# Patient Record
Sex: Female | Born: 1940 | Race: Black or African American | Hispanic: No | Marital: Married | Smoking: Former smoker
Health system: Southern US, Community
[De-identification: ages and names within clinical notes are randomized; demographics above are authoritative.]

## PROBLEM LIST (undated history)

## (undated) DIAGNOSIS — L439 Lichen planus, unspecified: Secondary | ICD-10-CM

## (undated) DIAGNOSIS — E785 Hyperlipidemia, unspecified: Secondary | ICD-10-CM

## (undated) DIAGNOSIS — I2510 Atherosclerotic heart disease of native coronary artery without angina pectoris: Secondary | ICD-10-CM

## (undated) DIAGNOSIS — C50919 Malignant neoplasm of unspecified site of unspecified female breast: Secondary | ICD-10-CM

## (undated) DIAGNOSIS — C801 Malignant (primary) neoplasm, unspecified: Secondary | ICD-10-CM

## (undated) DIAGNOSIS — Z923 Personal history of irradiation: Secondary | ICD-10-CM

## (undated) DIAGNOSIS — I219 Acute myocardial infarction, unspecified: Secondary | ICD-10-CM

## (undated) DIAGNOSIS — K219 Gastro-esophageal reflux disease without esophagitis: Secondary | ICD-10-CM

## (undated) DIAGNOSIS — I10 Essential (primary) hypertension: Secondary | ICD-10-CM

## (undated) HISTORY — DX: Gastro-esophageal reflux disease without esophagitis: K21.9

## (undated) HISTORY — PX: OOPHORECTOMY: SHX86

## (undated) HISTORY — PX: CORONARY ANGIOPLASTY WITH STENT PLACEMENT: SHX49

## (undated) HISTORY — PX: APPENDECTOMY: SHX54

## (undated) HISTORY — PX: ABDOMINAL HYSTERECTOMY: SHX81

## (undated) HISTORY — PX: BREAST EXCISIONAL BIOPSY: SUR124

## (undated) HISTORY — DX: Malignant (primary) neoplasm, unspecified: C80.1

## (undated) HISTORY — PX: TONSILLECTOMY: SUR1361

## (undated) HISTORY — DX: Lichen planus, unspecified: L43.9

## (undated) HISTORY — PX: CYSTOSCOPY: SUR368

---

## 2004-06-16 ENCOUNTER — Ambulatory Visit: Payer: Self-pay | Admitting: Family Medicine

## 2004-10-29 ENCOUNTER — Ambulatory Visit: Payer: Self-pay | Admitting: Gastroenterology

## 2005-07-08 ENCOUNTER — Ambulatory Visit: Payer: Self-pay | Admitting: Family Medicine

## 2006-07-14 ENCOUNTER — Ambulatory Visit: Payer: Self-pay | Admitting: Family Medicine

## 2006-11-04 ENCOUNTER — Ambulatory Visit: Payer: Self-pay | Admitting: Family Medicine

## 2006-12-01 ENCOUNTER — Ambulatory Visit: Payer: Self-pay | Admitting: Gastroenterology

## 2008-02-08 ENCOUNTER — Ambulatory Visit: Payer: Self-pay | Admitting: Family Medicine

## 2008-08-20 ENCOUNTER — Ambulatory Visit: Payer: Self-pay | Admitting: Family Medicine

## 2008-08-26 ENCOUNTER — Ambulatory Visit: Payer: Self-pay | Admitting: Family Medicine

## 2008-10-08 ENCOUNTER — Ambulatory Visit: Payer: Self-pay | Admitting: Gastroenterology

## 2009-03-20 ENCOUNTER — Ambulatory Visit: Payer: Self-pay | Admitting: Family Medicine

## 2009-04-19 ENCOUNTER — Emergency Department: Payer: Self-pay | Admitting: Emergency Medicine

## 2009-07-08 ENCOUNTER — Ambulatory Visit: Payer: Self-pay | Admitting: Otolaryngology

## 2009-07-09 ENCOUNTER — Inpatient Hospital Stay: Payer: Self-pay | Admitting: Otolaryngology

## 2009-09-19 ENCOUNTER — Ambulatory Visit: Payer: Self-pay | Admitting: Family Medicine

## 2010-05-19 ENCOUNTER — Ambulatory Visit: Payer: Self-pay | Admitting: Family Medicine

## 2010-06-01 ENCOUNTER — Ambulatory Visit: Payer: Self-pay | Admitting: Gastroenterology

## 2010-06-04 ENCOUNTER — Ambulatory Visit: Payer: Self-pay | Admitting: Gastroenterology

## 2011-01-26 ENCOUNTER — Ambulatory Visit: Payer: Self-pay | Admitting: Otolaryngology

## 2011-08-18 ENCOUNTER — Ambulatory Visit: Payer: Self-pay | Admitting: Family Medicine

## 2012-03-16 ENCOUNTER — Ambulatory Visit: Payer: Self-pay | Admitting: Specialist

## 2012-03-21 ENCOUNTER — Inpatient Hospital Stay: Payer: Self-pay | Admitting: Internal Medicine

## 2012-03-21 LAB — COMPREHENSIVE METABOLIC PANEL
Albumin: 4.1 g/dL (ref 3.4–5.0)
Alkaline Phosphatase: 74 U/L (ref 50–136)
Calcium, Total: 9.3 mg/dL (ref 8.5–10.1)
Chloride: 106 mmol/L (ref 98–107)
Co2: 23 mmol/L (ref 21–32)
Creatinine: 1.16 mg/dL (ref 0.60–1.30)
Osmolality: 288 (ref 275–301)
SGOT(AST): 19 U/L (ref 15–37)

## 2012-03-21 LAB — CK TOTAL AND CKMB (NOT AT ARMC)
CK, Total: 297 U/L — ABNORMAL HIGH (ref 21–215)
CK-MB: 2.2 ng/mL (ref 0.5–3.6)

## 2012-03-21 LAB — CBC
HCT: 29.6 % — ABNORMAL LOW (ref 35.0–47.0)
MCH: 22.6 pg — ABNORMAL LOW (ref 26.0–34.0)
MCHC: 30.2 g/dL — ABNORMAL LOW (ref 32.0–36.0)
Platelet: 416 10*3/uL (ref 150–440)
RBC: 3.94 10*6/uL (ref 3.80–5.20)
WBC: 10.7 10*3/uL (ref 3.6–11.0)

## 2012-03-21 LAB — TROPONIN I: Troponin-I: 0.05 ng/mL

## 2012-03-22 LAB — FERRITIN: Ferritin (ARMC): 9 ng/mL (ref 8–388)

## 2012-03-22 LAB — BASIC METABOLIC PANEL
Anion Gap: 6 — ABNORMAL LOW (ref 7–16)
BUN: 25 mg/dL — ABNORMAL HIGH (ref 7–18)
Chloride: 110 mmol/L — ABNORMAL HIGH (ref 98–107)
Co2: 27 mmol/L (ref 21–32)
EGFR (Non-African Amer.): 57 — ABNORMAL LOW
Glucose: 122 mg/dL — ABNORMAL HIGH (ref 65–99)
Osmolality: 291 (ref 275–301)

## 2012-03-22 LAB — IRON AND TIBC
Iron Saturation: 11 %
Iron: 48 ug/dL — ABNORMAL LOW (ref 50–170)
Unbound Iron-Bind.Cap.: 386 ug/dL

## 2012-03-22 LAB — CBC WITH DIFFERENTIAL/PLATELET
Basophil #: 0 10*3/uL (ref 0.0–0.1)
Basophil %: 0.2 %
Eosinophil #: 0 10*3/uL (ref 0.0–0.7)
Eosinophil %: 0 %
HCT: 25.5 % — ABNORMAL LOW (ref 35.0–47.0)
Lymphocyte #: 0.7 10*3/uL — ABNORMAL LOW (ref 1.0–3.6)
Lymphocyte %: 8.7 %
MCHC: 32.5 g/dL (ref 32.0–36.0)
MCV: 74 fL — ABNORMAL LOW (ref 80–100)
Neutrophil #: 7.3 10*3/uL — ABNORMAL HIGH (ref 1.4–6.5)
Neutrophil %: 85.2 %
Platelet: 342 10*3/uL (ref 150–440)
RBC: 3.44 10*6/uL — ABNORMAL LOW (ref 3.80–5.20)
RDW: 16.6 % — ABNORMAL HIGH (ref 11.5–14.5)

## 2012-03-22 LAB — TROPONIN I: Troponin-I: 4.4 ng/mL — ABNORMAL HIGH

## 2012-03-22 LAB — LIPID PANEL
Cholesterol: 163 mg/dL (ref 0–200)
HDL Cholesterol: 39 mg/dL — ABNORMAL LOW (ref 40–60)
Triglycerides: 75 mg/dL (ref 0–200)

## 2012-03-22 LAB — CK TOTAL AND CKMB (NOT AT ARMC)
CK, Total: 264 U/L — ABNORMAL HIGH (ref 21–215)
CK, Total: 262 U/L — ABNORMAL HIGH (ref 21–215)
CK-MB: 4.2 ng/mL — ABNORMAL HIGH (ref 0.5–3.6)

## 2012-03-22 LAB — HEMOGLOBIN A1C: Hemoglobin A1C: 5.5 % (ref 4.2–6.3)

## 2012-03-22 LAB — HEMOGLOBIN: HGB: 8 g/dL — ABNORMAL LOW (ref 12.0–16.0)

## 2012-03-23 LAB — URINALYSIS, COMPLETE
Bilirubin,UR: NEGATIVE
Glucose,UR: NEGATIVE mg/dL (ref 0–75)
Nitrite: POSITIVE
Ph: 8 (ref 4.5–8.0)
Protein: NEGATIVE
RBC,UR: 682 /HPF (ref 0–5)
Squamous Epithelial: NONE SEEN

## 2012-03-23 LAB — HEMOGLOBIN: HGB: 8.7 g/dL — ABNORMAL LOW (ref 12.0–16.0)

## 2012-03-23 LAB — HEMATOCRIT: HCT: 27.6 % — ABNORMAL LOW (ref 35.0–47.0)

## 2012-03-24 LAB — CBC WITH DIFFERENTIAL/PLATELET
Basophil #: 0 10*3/uL (ref 0.0–0.1)
Basophil %: 0.3 %
Eosinophil #: 0 10*3/uL (ref 0.0–0.7)
Eosinophil %: 0.3 %
Lymphocyte #: 1.8 10*3/uL (ref 1.0–3.6)
MCHC: 31.6 g/dL — ABNORMAL LOW (ref 32.0–36.0)
Monocyte #: 0.9 x10 3/mm (ref 0.2–0.9)
Neutrophil %: 69.8 %
Platelet: 279 10*3/uL (ref 150–440)
RBC: 3.42 10*6/uL — ABNORMAL LOW (ref 3.80–5.20)
WBC: 9.1 10*3/uL (ref 3.6–11.0)

## 2012-03-24 LAB — BASIC METABOLIC PANEL
Anion Gap: 7 (ref 7–16)
BUN: 14 mg/dL (ref 7–18)
Calcium, Total: 8.2 mg/dL — ABNORMAL LOW (ref 8.5–10.1)
Chloride: 107 mmol/L (ref 98–107)
Co2: 27 mmol/L (ref 21–32)
Creatinine: 0.83 mg/dL (ref 0.60–1.30)
EGFR (African American): >60
EGFR (Non-African Amer.): >60
Glucose: 100 mg/dL — ABNORMAL HIGH (ref 65–99)
Osmolality: 282 (ref 275–301)
Potassium: 3.5 mmol/L (ref 3.5–5.1)
Sodium: 141 mmol/L (ref 136–145)

## 2012-03-24 LAB — CK TOTAL AND CKMB (NOT AT ARMC): CK-MB: 1.8 ng/mL (ref 0.5–3.6)

## 2012-03-25 LAB — BASIC METABOLIC PANEL
Anion Gap: 6 — ABNORMAL LOW (ref 7–16)
BUN: 13 mg/dL (ref 7–18)
Chloride: 106 mmol/L (ref 98–107)
Co2: 28 mmol/L (ref 21–32)
Creatinine: 0.95 mg/dL (ref 0.60–1.30)
EGFR (African American): >60
Glucose: 101 mg/dL — ABNORMAL HIGH (ref 65–99)
Osmolality: 280 (ref 275–301)

## 2012-03-25 LAB — CBC WITH DIFFERENTIAL/PLATELET
Comment - H1-Com6: NORMAL
Eosinophil: 1 %
HCT: 28.3 % — ABNORMAL LOW (ref 35.0–47.0)
MCHC: 31.2 g/dL — ABNORMAL LOW (ref 32.0–36.0)
MCV: 74 fL — ABNORMAL LOW (ref 80–100)
Monocytes: 4 %
NRBC/100 WBC: 2 /
Variant Lymphocyte - H1-Rlymph: 3 %
WBC: 10.9 10*3/uL (ref 3.6–11.0)

## 2012-03-25 LAB — URINE CULTURE

## 2012-04-03 ENCOUNTER — Ambulatory Visit: Payer: Self-pay | Admitting: Family Medicine

## 2012-08-10 ENCOUNTER — Ambulatory Visit: Payer: Self-pay | Admitting: Gastroenterology

## 2012-08-10 LAB — HM COLONOSCOPY

## 2013-03-15 ENCOUNTER — Ambulatory Visit: Payer: Self-pay | Admitting: Family Medicine

## 2013-03-26 ENCOUNTER — Ambulatory Visit: Payer: Self-pay | Admitting: Family Medicine

## 2013-04-11 LAB — LIPID PANEL
CHOLESTEROL: 118 mg/dL (ref 0–200)
HDL: 37 mg/dL (ref 35–70)
LDL Cholesterol: 63 mg/dL
LDl/HDL Ratio: 1.7
Triglycerides: 91 mg/dL (ref 40–160)

## 2013-07-11 LAB — HEPATIC FUNCTION PANEL
ALT: 14 U/L (ref 7–35)
AST: 16 U/L (ref 13–35)
Alkaline Phosphatase: 67 U/L (ref 25–125)
BILIRUBIN, TOTAL: 0.6 mg/dL

## 2013-08-17 DIAGNOSIS — I739 Peripheral vascular disease, unspecified: Secondary | ICD-10-CM | POA: Insufficient documentation

## 2013-08-17 DIAGNOSIS — E119 Type 2 diabetes mellitus without complications: Secondary | ICD-10-CM | POA: Insufficient documentation

## 2013-08-17 DIAGNOSIS — I2510 Atherosclerotic heart disease of native coronary artery without angina pectoris: Secondary | ICD-10-CM | POA: Insufficient documentation

## 2013-08-23 DIAGNOSIS — R001 Bradycardia, unspecified: Secondary | ICD-10-CM | POA: Insufficient documentation

## 2013-12-30 ENCOUNTER — Inpatient Hospital Stay: Payer: Self-pay | Admitting: Internal Medicine

## 2013-12-30 LAB — URINALYSIS, COMPLETE
BILIRUBIN, UR: NEGATIVE
Glucose,UR: NEGATIVE mg/dL (ref 0–75)
Ketone: NEGATIVE
NITRITE: POSITIVE
PH: 5 (ref 4.5–8.0)
Protein: 100
RBC,UR: 60 /HPF (ref 0–5)
Specific Gravity: 1.018 (ref 1.003–1.030)
WBC UR: 1180 /HPF (ref 0–5)

## 2013-12-30 LAB — CBC WITH DIFFERENTIAL/PLATELET
BASOS ABS: 0.1 10*3/uL (ref 0.0–0.1)
Basophil %: 1.4 %
EOS ABS: 0.1 10*3/uL (ref 0.0–0.7)
EOS PCT: 2 %
HCT: 24.3 % — AB (ref 35.0–47.0)
HGB: 7.2 g/dL — AB (ref 12.0–16.0)
LYMPHS PCT: 19 %
Lymphocyte #: 1.3 10*3/uL (ref 1.0–3.6)
MCH: 21.6 pg — ABNORMAL LOW (ref 26.0–34.0)
MCHC: 29.8 g/dL — AB (ref 32.0–36.0)
MCV: 73 fL — ABNORMAL LOW (ref 80–100)
MONOS PCT: 9.1 %
Monocyte #: 0.6 x10 3/mm (ref 0.2–0.9)
Neutrophil #: 4.9 10*3/uL (ref 1.4–6.5)
Neutrophil %: 68.5 %
Platelet: 314 10*3/uL (ref 150–440)
RBC: 3.35 10*6/uL — AB (ref 3.80–5.20)
RDW: 17.4 % — ABNORMAL HIGH (ref 11.5–14.5)
WBC: 7.1 10*3/uL (ref 3.6–11.0)

## 2013-12-30 LAB — CK-MB
CK-MB: 1.1 ng/mL (ref 0.5–3.6)
CK-MB: 0.9 ng/mL (ref 0.5–3.6)

## 2013-12-30 LAB — IRON AND TIBC
Iron Bind.Cap.(Total): 414 ug/dL (ref 250–450)
Iron Saturation: 3 %
Iron: 13 ug/dL — ABNORMAL LOW (ref 50–170)
Unbound Iron-Bind.Cap.: 401 ug/dL

## 2013-12-30 LAB — BASIC METABOLIC PANEL
Anion Gap: 9 (ref 7–16)
BUN: 18 mg/dL (ref 7–18)
CALCIUM: 8.3 mg/dL — AB (ref 8.5–10.1)
CO2: 27 mmol/L (ref 21–32)
CREATININE: 1.49 mg/dL — AB (ref 0.60–1.30)
Chloride: 108 mmol/L — ABNORMAL HIGH (ref 98–107)
EGFR (African American): 44 — ABNORMAL LOW
GFR CALC NON AF AMER: 36 — AB
GLUCOSE: 106 mg/dL — AB (ref 65–99)
Osmolality: 289 (ref 275–301)
Potassium: 3.4 mmol/L — ABNORMAL LOW (ref 3.5–5.1)
Sodium: 144 mmol/L (ref 136–145)

## 2013-12-30 LAB — TROPONIN I
Troponin-I: 0.07 ng/mL — ABNORMAL HIGH
Troponin-I: 0.07 ng/mL — ABNORMAL HIGH

## 2013-12-30 LAB — FERRITIN: Ferritin (ARMC): 6 ng/mL — ABNORMAL LOW (ref 8–388)

## 2013-12-31 LAB — CBC WITH DIFFERENTIAL/PLATELET
BASOS PCT: 1.2 %
Basophil #: 0.1 10*3/uL (ref 0.0–0.1)
Basophil #: 0.1 10*3/uL (ref 0.0–0.1)
Basophil %: 1.5 %
EOS ABS: 0.3 10*3/uL (ref 0.0–0.7)
Eosinophil #: 0.3 10*3/uL (ref 0.0–0.7)
Eosinophil %: 3.6 %
Eosinophil %: 3.2 %
HCT: 27.1 % — ABNORMAL LOW (ref 35.0–47.0)
HCT: 28.1 % — AB (ref 35.0–47.0)
HGB: 8.6 g/dL — ABNORMAL LOW (ref 12.0–16.0)
HGB: 8.4 g/dL — AB (ref 12.0–16.0)
LYMPHS PCT: 19.7 %
Lymphocyte #: 1.4 10*3/uL (ref 1.0–3.6)
Lymphocyte #: 1.4 10*3/uL (ref 1.0–3.6)
Lymphocyte %: 18.3 %
MCH: 23 pg — ABNORMAL LOW (ref 26.0–34.0)
MCH: 22.8 pg — ABNORMAL LOW (ref 26.0–34.0)
MCHC: 30.6 g/dL — ABNORMAL LOW (ref 32.0–36.0)
MCHC: 31.2 g/dL — AB (ref 32.0–36.0)
MCV: 75 fL — AB (ref 80–100)
MCV: 74 fL — AB (ref 80–100)
Monocyte #: 0.8 x10 3/mm (ref 0.2–0.9)
Monocyte #: 0.7 x10 3/mm (ref 0.2–0.9)
Monocyte %: 10.7 %
Monocyte %: 8.4 %
NEUTROS ABS: 5.4 10*3/uL (ref 1.4–6.5)
NEUTROS PCT: 64.5 %
Neutrophil #: 4.7 10*3/uL (ref 1.4–6.5)
Neutrophil %: 68.9 %
PLATELETS: 274 10*3/uL (ref 150–440)
Platelet: 295 10*3/uL (ref 150–440)
RBC: 3.76 10*6/uL — ABNORMAL LOW (ref 3.80–5.20)
RBC: 3.67 10*6/uL — AB (ref 3.80–5.20)
RDW: 17.8 % — ABNORMAL HIGH (ref 11.5–14.5)
RDW: 18.1 % — ABNORMAL HIGH (ref 11.5–14.5)
WBC: 7.2 10*3/uL (ref 3.6–11.0)
WBC: 7.8 10*3/uL (ref 3.6–11.0)

## 2013-12-31 LAB — OCCULT BLOOD X 1 CARD TO LAB, STOOL: Occult Blood, Feces: NEGATIVE

## 2013-12-31 LAB — BASIC METABOLIC PANEL
ANION GAP: 6 — AB (ref 7–16)
BUN: 18 mg/dL (ref 7–18)
Calcium, Total: 8.1 mg/dL — ABNORMAL LOW (ref 8.5–10.1)
Chloride: 108 mmol/L — ABNORMAL HIGH (ref 98–107)
Co2: 30 mmol/L (ref 21–32)
Creatinine: 1.23 mg/dL (ref 0.60–1.30)
EGFR (Non-African Amer.): 45 — ABNORMAL LOW
GFR CALC AF AMER: 55 — AB
Glucose: 101 mg/dL — ABNORMAL HIGH (ref 65–99)
OSMOLALITY: 289 (ref 275–301)
POTASSIUM: 4.1 mmol/L (ref 3.5–5.1)
SODIUM: 144 mmol/L (ref 136–145)

## 2013-12-31 LAB — MAGNESIUM: Magnesium: 1.9 mg/dL

## 2013-12-31 LAB — FOLATE: Folic Acid: 12.9 ng/mL (ref 3.1–100.0)

## 2013-12-31 LAB — CK-MB: CK-MB: 1.4 ng/mL (ref 0.5–3.6)

## 2013-12-31 LAB — TROPONIN I: Troponin-I: 0.07 ng/mL — ABNORMAL HIGH

## 2014-01-01 LAB — CBC WITH DIFFERENTIAL/PLATELET
Basophil #: 0.1 10*3/uL (ref 0.0–0.1)
Basophil %: 1.2 %
EOS ABS: 0.3 10*3/uL (ref 0.0–0.7)
Eosinophil %: 4.1 %
HCT: 27 % — ABNORMAL LOW (ref 35.0–47.0)
HGB: 8.1 g/dL — ABNORMAL LOW (ref 12.0–16.0)
Lymphocyte #: 1.5 10*3/uL (ref 1.0–3.6)
Lymphocyte %: 19.3 %
MCH: 22.2 pg — AB (ref 26.0–34.0)
MCHC: 29.9 g/dL — ABNORMAL LOW (ref 32.0–36.0)
MCV: 74 fL — ABNORMAL LOW (ref 80–100)
MONO ABS: 0.7 x10 3/mm (ref 0.2–0.9)
MONOS PCT: 9.1 %
NEUTROS PCT: 66.3 %
Neutrophil #: 5.1 10*3/uL (ref 1.4–6.5)
Platelet: 274 10*3/uL (ref 150–440)
RBC: 3.63 10*6/uL — ABNORMAL LOW (ref 3.80–5.20)
RDW: 18.5 % — AB (ref 11.5–14.5)
WBC: 7.7 10*3/uL (ref 3.6–11.0)

## 2014-01-01 LAB — URINE CULTURE

## 2014-01-02 LAB — PROT IMMUNOELECTROPHORES(ARMC)

## 2014-01-02 LAB — URINE CULTURE

## 2014-01-17 ENCOUNTER — Ambulatory Visit: Payer: Self-pay | Admitting: Internal Medicine

## 2014-01-17 LAB — URINALYSIS, COMPLETE
Bacteria: NONE SEEN
Bilirubin,UR: NEGATIVE
Glucose,UR: NEGATIVE mg/dL (ref 0–75)
Hyaline Cast: 2
Ketone: NEGATIVE
NITRITE: NEGATIVE
PH: 5 (ref 4.5–8.0)
Protein: 100
Specific Gravity: 1.021 (ref 1.003–1.030)
Squamous Epithelial: 13
WBC UR: 151 /HPF (ref 0–5)

## 2014-01-17 LAB — CBC CANCER CENTER
BASOS PCT: 1.2 %
Basophil #: 0.1 x10 3/mm (ref 0.0–0.1)
EOS PCT: 3.3 %
Eosinophil #: 0.3 x10 3/mm (ref 0.0–0.7)
HCT: 32.7 % — ABNORMAL LOW (ref 35.0–47.0)
HGB: 10 g/dL — ABNORMAL LOW (ref 12.0–16.0)
LYMPHS PCT: 22.2 %
Lymphocyte #: 1.7 x10 3/mm (ref 1.0–3.6)
MCH: 22.8 pg — ABNORMAL LOW (ref 26.0–34.0)
MCHC: 30.6 g/dL — ABNORMAL LOW (ref 32.0–36.0)
MCV: 74 fL — ABNORMAL LOW (ref 80–100)
MONOS PCT: 6.4 %
Monocyte #: 0.5 x10 3/mm (ref 0.2–0.9)
Neutrophil #: 5.1 x10 3/mm (ref 1.4–6.5)
Neutrophil %: 66.9 %
PLATELETS: 313 x10 3/mm (ref 150–440)
RBC: 4.4 10*6/uL (ref 3.80–5.20)
RDW: 23.3 % — AB (ref 11.5–14.5)
WBC: 7.7 x10 3/mm (ref 3.6–11.0)

## 2014-01-17 LAB — CREATININE, SERUM
Creatinine: 1.27 mg/dL (ref 0.60–1.30)
GFR CALC AF AMER: 53 — AB
GFR CALC NON AF AMER: 44 — AB

## 2014-01-17 LAB — RETICULOCYTES
ABSOLUTE RETIC COUNT: 0.0753 10*6/uL (ref 0.019–0.186)
RETICULOCYTE: 1.71 % (ref 0.4–3.1)

## 2014-01-18 LAB — KAPPA/LAMBDA FREE LIGHT CHAINS (ARMC)

## 2014-01-19 LAB — URINE CULTURE

## 2014-02-05 ENCOUNTER — Ambulatory Visit: Payer: Self-pay | Admitting: Internal Medicine

## 2014-03-08 HISTORY — PX: COLONOSCOPY: SHX174

## 2014-04-02 ENCOUNTER — Ambulatory Visit: Payer: Self-pay | Admitting: Family Medicine

## 2014-04-02 LAB — HM MAMMOGRAPHY: HM MAMMO: NEGATIVE

## 2014-05-22 LAB — CBC AND DIFFERENTIAL
HCT: 35 % — AB (ref 36–46)
HEMOGLOBIN: 11.1 g/dL — AB (ref 12.0–16.0)
Neutrophils Absolute: 59 /uL
PLATELETS: 250 10*3/uL (ref 150–399)
WBC: 6.1 10^3/mL

## 2014-05-22 LAB — BASIC METABOLIC PANEL
BUN: 26 mg/dL — AB (ref 4–21)
Creatinine: 1.2 mg/dL — AB (ref 0.5–1.1)
GLUCOSE: 66 mg/dL
Potassium: 4.4 mmol/L (ref 3.4–5.3)
SODIUM: 142 mmol/L (ref 137–147)

## 2014-05-22 LAB — TSH: TSH: 1.59 u[IU]/mL (ref 0.41–5.90)

## 2014-06-28 NOTE — Consult Note (Signed)
PATIENT NAME:  [name] [name], [name] [name] MR#:  [id number] DATE OF BIRTH:  May 04, 1940  DATE OF CONSULTATION:  03/23/2012  CONSULTING PHYSICIAN:  Lupita Dawn. Majesty Oehlert, MD  REASON FOR REFERRAL: Anemia and acute myocardial infarction.   HISTORY OF PRESENT ILLNESS: The patient is a 74 year old white female who presented to the hospital on January 14th with acute chest pain associated with nausea, vomiting that afternoon. The pain radiated to her left arm. She had some trouble breathing and also she was with diaphoresis. She was brought in and was given nitroglycerin, with some improvement. The patient was also given a regular aspirin as well.  Soon thereafter, the pain went away; however, she did rule in for acute MI with troponin level going up to 4.40. She had a cardiac catheterization that showed diffuse critical LAD stenosis with significant left circumferential stenosis which may be the primary source of her acute MI. I was asked to see the patient because of anemia. Her hemoglobin was down to 8.0.   Currently, the patient is without any chest pain or shortness of breath. She does have some indigestion symptoms periodically. Otherwise, she denies any nausea, vomiting, heartburn, or abdominal pain. She denies any gross melena or hematochezia or hematemesis. She had her last upper endoscopy by me in March 2012  because of the history of reflux and dysphagia At that time, she had a mild stenosis at the GE junction. She had a hiatal hernia, but the rest of the upper GI tract was negative except for some tortuous esophagus. Her previous colonoscopy was again by me in August 2010 because of the history of polyps. At that time, she had left-sided as well as right-sided diverticula, but there was no evidence of diverticulitis or colon polyps. She is due for a repeat colonoscopy in 2015. Again, the patient denies any gross active bleeding.   PAST MEDICAL HISTORY: Notable for reflux and hypertension.   ALLERGIES: She has no known  drug allergies.   SOCIAL HISTORY: She does not smoke or drink.   FAMILY HISTORY: Notable for heart disease.   PAST SURGICAL HISTORY: She has had hysterectomy.   HOME MEDICATIONS:  Amlodipine, bisoprolol, and Zantac once a day.  REVIEW OF SYSTEMS: There are no fevers or chills, weakness or fatigue. There are no visual or hearing changes. She did have chest pain and mild shortness of breath. There are no palpitations or coughing. For GI, other than that episode of nausea and vomiting when she came in she has not had any further GI symptoms at all.   PHYSICAL EXAMINATION: GENERAL: Currently, the patient is in no acute distress. She is afebrile, temperature 97.7, pulse 73, respirations 18, blood pressure 153/76, and pulse oximetry is 96 on room air.  HEENT: Normocephalic, atraumatic head. Pupils are equally reactive. Throat was clear.  NECK: Supple.  CARDIAC: Regular rhythm and rate without murmurs.  LUNGS: Clear bilaterally.  ABDOMEN: Normoactive bowel sounds, soft, nontender. There is no hepatomegaly. She has active bowel sounds.  EXTREMITIES: No clubbing, cyanosis or edema.   LABORATORY DATA: Today, hemoglobin is 8.7.   IMPRESSION AND RECOMMENDATIONS: This is a patient with known coronary artery disease, with significant stenosis, who ruled in for MI. She is anemic, but there are no signs of active GI bleeding. To confirm that she, indeed, has GI bleeding, I will order a stool heme test. In light of an acute MI and significant heart disease, I would be reluctant to do any endoscopies at this time. I would go ahead  with either angioplasty or metal stent placement for the artery. I discussed the case with Dr. Nehemiah Massed. If for some reason the patient bleeds with Plavix after stenting, then we will deal with that later. In the meantime, we will increase the Protonix to twice a day and keep her on it for now; and, hopefully, we will prevent any bleeding or drop in hemoglobin.   Thank you for the  referral.  ____________________________ Lupita Dawn. Candace Cruise, MD pyo:cb D: 03/23/2012 12:36:15 ET T: 03/23/2012 12:50:57 ET JOB#: [id number]  cc: Lupita Dawn. Candace Cruise, MD, <Dictator> Lupita Dawn Cleofas Hudgins MD ELECTRONICALLY SIGNED 03/29/2012 12:15

## 2014-06-28 NOTE — Consult Note (Signed)
PATIENT NAME:  [name] [name], DOBKINS MR#:  [id number] DATE OF BIRTH:  April 12, 1940  DATE OF CONSULTATION:  03/22/2012  REFERRING PHYSICIAN:         Epifanio Lesches, MD CONSULTING PHYSICIAN:      Corey Skains, MD PRIMARY CARE PHYSICIAN:  Miguel Aschoff, MD  REASON FOR CONSULTATION: Chest discomfort with abnormal EKG.   CHIEF COMPLAINT: "I had chest pain."   HISTORY OF PRESENT ILLNESS:  This is a 74 year old female with known hypertension on appropriate medication management who has had new onset of substernal chest discomfort, left upper chest radiating into her back and left arm associated with shortness of breath, nausea and abdominal discomfort. The patient had diaphoresis as well which escalated multiple times during a 24-hour period and she was seen in the Emergency Room. EKG has shown normal sinus rhythm with lateral ST depression and the patient did eventually have an elevation of troponin of 2.6. The patient has had no further chest discomfort at this time with nitroglycerin, which is has helped her a great deal and IV fluids. The patient has had hypertension on appropriate medications in the past.   REVIEW OF SYSTEMS: The remainder review of systems negative for vision change hair hearing loss, cough, congestion, heartburn, nausea, vomiting, diarrhea, bloody stools, stomach pain, extremity pain, leg weakness, cramping of the buttocks, known blood clots, headaches, blackouts, dizzy spells, nosebleeds, congestion, trouble swallowing, frequent urination, skin lesions or skin rashes.   PAST MEDICAL HISTORY:  Hypertension.   FAMILY HISTORY: No family members with early onset of cardiovascular disease.   SOCIAL HISTORY: Currently denies alcohol or tobacco use.   ALLERGIES: No known drug allergies.   CURRENT MEDICATIONS: As listed.   PHYSICAL EXAMINATION: VITAL SIGNS: Blood pressure 126/68 bilaterally, heart rate 72 upright, reclining and regular.  GENERAL: She is a well appearing  female in no acute distress.  HEENT: No icterus, thyromegaly, ulcers, hemorrhage or xanthelasma.  HEART: Regular rate and rhythm. Normal S1 and S2 without murmur, gallop or rub. Point of maximal impulse is normal size and placement. Carotid upstroke normal without bruit. Jugular venous pressure is normal.  LUNGS: Lungs have few basilar crackles with normal respirations.  ABDOMEN: Soft, nontender, without hepatosplenomegaly or masses. Abdominal aorta is normal size without bruit.  EXTREMITIES: Shows 2+ bilateral pulses in dorsal, pedal, radial and femoral arteries without lower extremity edema, cyanosis, clubbing or ulcers.  NEUROLOGIC: She is oriented to time, place, and person with normal mood affect.   LABORATORY AND DIAGNOSTIC DATA:   Shows a hemoglobin of 8.9.   ASSESSMENT: This is a 74 year old female with chest pain, shortness of breath and anemia with abnormal EKG and myocardial infarction, needing further treatment options.   RECOMMENDATIONS: 1.  Continue serial ECG and enzymes to assess for extent of myocardial infarction.  2.  Further investigation of anemia, although the patient has no current evidence of acute bleed. 3.  Nitroglycerin, oxygen and would consider heparin at this time despite anemia.  4.  Proceed to cardiac catheterization to assess coronary anatomy and further treatment thereof as necessary. The patient understands the risks and benefits of cardiac catheterization. This includes the possibility of death, stroke, heart attack, infection, bleeding or blood clot. She is at low risk for conscious sedation.       ____________________________ Corey Skains, MD bjk:ct D: 03/22/2012 08:41:33 ET T: 03/22/2012 09:15:08 ET JOB#: [id number]  cc: Corey Skains, MD, <Dictator> Corey Skains MD ELECTRONICALLY SIGNED 03/23/2012 8:26

## 2014-06-28 NOTE — H&P (Signed)
PATIENT NAME:  [name] [name], [name] [name] MR#:  [id number] DATE OF BIRTH:  10/31/1940  DATE OF ADMISSION:  03/21/2012  PRIMARY CARE PHYSICIAN:  Richard L. Rosanna Randy, MD.   .  CHIEF COMPLAINT:  Chest pain.   HISTORY OF PRESENT ILLNESS:  A 74 year old female patient with history of hypertension and GERD, who came in because of chest pain. The patient started to have nausea and vomiting and chest pain around 3:30 this afternoon. At that time, she had chest pain in the left side of chest radiating to the left arm associated with some trouble breathing and sweating. She took some rest and the symptoms went away. Around 6:30 p.m., the symptoms restarted with chest pain on the left side, 10 out of 10 in severity associated with sweating and also near syncope. The patient also felt short of breath. No cough. No fever. Symptoms resolved with nitroglycerin given in the ER. The patient also received aspirin 325 mg. Right now, she feels better and she is symptom free.   PAST MEDICAL HISTORY:  Significant for hypertension and GERD.   ALLERGIES:  No known allergies.   SOCIAL HISTORY:  No smoking. No drinking. No drugs.   FAMILY HISTORY:  Father had heart disease at the age of 76.   PAST SURGICAL HISTORY:  Total abdominal hysterectomy.   MEDICATIONS:  She takes amlodipine 5 mg every other day and bisoprolol with HCTZ (the patient does not remember the dose), ranitidine 300 mg once a day.   REVIEW OF SYSTEMS: CONSTITUTIONAL: No fever. No fatigue.  EYES: No blurred vision.  ENT: No tinnitus. No ear pain. No epistaxis. No difficulty swallowing.  RESPIRATORY: No cough. No wheezing.  CARDIOVASCULAR: Had chest pain this morning. No orthopnea. No pedal edema. No palpitations. No syncope.  GASTROINTESTINAL: The patient felt nausea and vomiting this afternoon, abdominal pain.  GENITOURINARY: No dysuria.  ENDOCRINE: No polyuria or nocturia.  INTEGUMENTARY: No skin rashes.  HEMATOLOGIC: No anemia.  MUSCULOSKELETAL: No  joint pain.  NEUROLOGIC: No numbness or weakness.  PSYCHIATRIC: No anxiety or insomnia.   PHYSICAL EXAMINATION: VITAL SIGNS: Temperature 98.3, heart rate 84, respirations 18, blood pressure 119/65, saturation 91% on room air.  GENERAL: The patient is an alert, awake, oriented female, not in distress, answering questions appropriately.  HEENT: Head normocephalic. Pupils are equally reacting to light. Extraocular movements are intact. No conjunctivitis. The patient has no oropharyngeal erythema. Mucous membranes are normal.  NECK: No thyroid enlargement. No JVD. No carotid bruit.  RESPIRATORY: Clear to auscultation. No wheeze. No rales.  CARDIOVASCULAR: S1, S2 regular. No murmurs.  ABDOMEN: Soft, nontender, nondistended. Bowel sounds present.  MUSCULOSKELETAL: Strength 5/5 in upper and lower extremities.  SKIN: No skin rashes.  NEUROLOGIC: Cranial nerves II through XII intact. Power 5 out of 5 in upper and lower extremities. Sensation is intact. DTRs are 2+ bilaterally.  PSYCHIATRIC: Oriented to time, place, person.   DIAGNOSTIC DATA:  Chest x-ray shows heart is at the upper limits of normal range, mild prominence in the right superior mediastinum likely secondary to patient rotation. large hiatal hernia. Mild right basilar opacity may be secondary to atelectasis. WBC 10.7, hemoglobin 8.9, hematocrit 29.6, platelets 469. Electrolytes: Sodium is 140, potassium 3.3, chloride 106, bicarbonate 23, BUN 24, creatinine 1.16, glucose 177. Troponin is 0.05. CK is elevated at 297. CPK-MB is 2.2. EKG shows sinus tachycardia with ST depressions in V4, V5 and V6, 102 beats per minute.   ASSESSMENT AND PLAN:  The patient is a 74 year old female patient with  chest pain and EKG showed evidence of ST depressions in lateral leads and symptoms of nausea, vomiting, diaphoresis, elevated CK concerning for unstable angina. Admit to hospitalist service. Continue to cycle the cardiac markers 2 more times. Continue aspirin,  beta-blockers and nitroglycerin. Obtain fasting lipods and continue full-dose Lovenox. If the troponins are negative, we need to get a stress test; otherwise, we will get a cardiology evaluation and discuss the plan with the patient in detail.   TIME SPENT ON HISTORY AND PHYSICAL:  About 55 minutes.    ____________________________ Epifanio Lesches, MD sk:si D: 03/21/2012 21:48:00 ET T: 03/21/2012 22:56:23 ET JOB#: [id number]  cc: Epifanio Lesches, MD, <Dictator> Epifanio Lesches MD ELECTRONICALLY SIGNED 04/28/2012 9:10

## 2014-06-28 NOTE — Consult Note (Signed)
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  Has claudication, no limb threatening symptoms.follow up with ABIs in a few weeksartery lesions, mild to moderate.  BP control seems adequate.  Cont medical managment and will check renal artery duplex in future as follow up.  Electronic Signatures: Algernon Huxley (MD)  (Signed on 16-Jan-14 12:20)  Authored  Last Updated: 16-Jan-14 12:20 by Algernon Huxley (MD)

## 2014-06-28 NOTE — Consult Note (Signed)
Pt seen and examined. Discussed case with Dr. Nehemiah Massed. Pt with acute MI. Needs stenting of left circumflex. Pt has had both EGD and colonoscopy in the recent past. Has large hiatal hernia and diverticulosis. No active bleeding. Hgb low, however. Ordered stool for blood. Increased protonix to bid since patient does have indigestion. No immediate plans for endoscopies in light of acute MI. As discussed with cardiology, proceed with metal stenting. Will moniter for bleeding with plavix. Will follow. Thanks.  Electronic Signatures: Verdie Shire (MD)  (Signed on 16-Jan-14 08:02)  Authored  Last Updated: 16-Jan-14 08:02 by Verdie Shire (MD)

## 2014-06-28 NOTE — Consult Note (Signed)
CHIEF COMPLAINT and HISTORY:   Subjective/Chief Complaint admitted for chest pain, MI present.   Asked to see patient for renal and iliac stenosis on initial cath    History of Present Illness Patient admitted with chest pain and found to have MI. 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  She reports some claudication symptoms with extending exercise, somewhat limiting.  Does not have rest pain or tissue loss.  For intervention today and no issues crossing lesion on either procedure.  Also, has hypertension although control seems reasonable.  Left renal artery stenosis appears in the 70% range.  Right less.    Past Medical Health Coronary Artery Disease, Hypertension   PAST MEDICAL/SURGICAL HISTORY:  Past Medical History:   GERD - Esophageal Reflux:    Hypertension:   ALLERGIES:  Allergies:  Amoxicillin: GI Distress  HOME MEDICATIONS:  Home Medications: Medication Instructions Status  amlodipine 5 mg oral tablet 1 tab(s) orally every other day alternating with bisoprolol/hydrochlorothiazide Active  ranitidine 300 mg oral capsule 1 cap(s) orally once a day Active  bisoprolol-hydrochlorothiazide 1 tab(s) orally every other day alternating with amlodipine Active   Family and Social History:   Family History Non-Contributory    Social History negative ETOH, negative Illicit drugs    Place of Living Home   Review of Systems:   Subjective/Chief Complaint chest pain/MI    Fever/Chills No    Cough No    Sputum No    Abdominal Pain No    Diarrhea No    Constipation No    Nausea/Vomiting No    SOB/DOE Yes    Chest Pain Yes    Telemetry Reviewed NSR    Dysuria No    Tolerating PT Yes    Tolerating Diet Yes   Physical Exam:   GEN well developed, well nourished, no acute distress    HEENT pink conjunctivae, hearing intact to voice    NECK supple  No masses    RESP normal resp effort  clear  BS    CARD regular rate  no JVD    ABD denies tenderness  no liver/spleen enlargement  abdominal Bruits    GU foley catheter in place    LYMPH negative neck, negative axillae    EXTR negative cyanosis/clubbing, negative edema    SKIN normal to palpation    NEURO cranial nerves intact, motor/sensory function intact    PSYCH alert, A+O to time, place, person   LABS:  Laboratory Results: Hepatic:    14-Jan-14 19:53, Comprehensive Metabolic Panel   Bilirubin, Total 0.4   Alkaline Phosphatase 74   SGPT (ALT) 18   SGOT (AST) 19   Total Protein, Serum 8.1   Albumin, Serum 4.1  Cardiac Catherization:    15-Jan-14 13:55, Cardiac Catheterization   Cardiac Catheterization    [facility]  [place], [place] [address]  [phone]     Cardiovascular Catheterization Comprehensive Report     Patient: [name] [name]  Study date: 03/22/2012  MR number: [id number]  Account number: [id number]     DOB: [birth year]  Age: 74 years  Gender: Female  Race: Black  Height: 65 in  Weight: 159.7 lb     Referring Physician:  Miguel Aschoff, MD  Interventional Cardiologist:  Lujean Amel, MD     SUMMARY:     -SPLANCHNIC AND RENAL VESSELS: Left renal: There was a discrete 75 %  stenosis. Right renal: There was  a 50 % stenosis.     -Summary: Normal LVF  Normal Wall Motion  EF=55%  Severe multy vessel CAD  Diffuse 75% LAD  Circ 95 mid/distal  RCA 50% mid mild diffuse distal  Renals shot for PVD ASCVD HTN  75% L renal 50% R  Right iliac 95%right 50/70 Left iliac  PLAN  Review film with BK and [facility]  Medical therapy for cors vs spot stenting  Refer to vascular for renals and iliac     CORONARY CIRCULATION: The coronary circulation is right dominant.  Proximal LAD: There was a 70 % stenosis. Mid LAD: There was a diffuse  70 % stenosis. Distal LAD: The distal vessel was supplied by faint  collaterals from the RCA. There was a diffuse 70 % stenosis.  2nd  diagonal: There was a 70 % stenosis. Mid circumflex: There was a 60 %  stenosis. Distal circumflex: There was a 90 % stenosis. Distal RCA:  There was a 50 % stenosis.     ABDOMINAL VESSELS: Left renal: There was a discrete 75 % stenosis.  Right renal: There was a 50 % stenosis.     LEFT LOWER EXTREMITY VESSELS: Left popliteal: There was a discrete 95  % stenosis.     VENTRICLES: There were no left ventricular global or regional wall  motion abnormalities. The left ventricle was normal in size.     VALVES: AORTIC VALVE: The aortic valve was evaluated byleft  ventriculography. The aortic valve appeared to be structurally  normal. The aortic valve leaflets exhibited normal thickness and  normal excursion. There was no aortic stenosis. MITRAL VALVE: The  mitral valve was evaluated by left ventriculography. The mitral valve  appeared grossly normal. The mitral leaflets exhibited normal  thickness and normal excursion. The mitral valve exhibited no  regurgitation.     INDICATIONS: Angina/MI: unstable angina. Renal: HTN poorly controlled  with medication. Lower extremity: claudication.     HISTORY: No history of previous myocardial infarction. There was no  prior diagnosis of congestive heart failure. The patient has  hypertension. There was no history of cerebrovascular disease,  peripheral arterial disease, chronic lung disease, diabetes, or  dyslipidemia. There was no family history of coronary artery disease.  PRIOR CARDIOVASCULAR PROCEDURES: No history of valve surgery,  coronary or graft percutaneous intervention, or coronary bypass  surgery.     PRIOR DIAGNOSTIC TEST RESULTS: No prior stress test is available.     PROCEDURES PERFORMED: Left heart catheterization with  ventriculography. Bilateral renal angiography. Abdominal aortography.  Procedure: Successful manual compression to RFA     PROCEDURE: The risks and alternatives of the procedures and conscious  sedation  were explained to the patient and informed consent was  obtained. The patient was brought to the cath lab and placed on the  table. The planned puncture sites wereprepped and draped in the  usual sterile fashion.     -Right femoral artery access. The puncture site was infiltrated with  20 ml 1 % lidocaine. The vessel was accessed using the modified  Seldinger technique, a wire was threaded into the vessel, anda 5 Fr  x 11 cm Avanti sheath was advanced over the wire into the vessel.     -Left heart catheterization. A catheter was advanced to the ascending  aorta. Ventriculography was performed using power injection of  contrast agent.     -Bilateral renal angiography. A catheter was positioned.     -Abdominal aortography. A catheter was placed and contrast was  injected.     -Successful manual compression to RFA.     PROCEDURE COMPLETION: TIMING: Test started at 13:56. Test concluded at  14:32. RADIATION EXPOSURE: Fluoroscopy time: 6.93 min.  MEDICATIONS GIVEN: Fentanyl, 50 mcg, IV, at 14:00. Midazolam, 1 mg,  IV, at 14:00. Fentanyl, 50 mcg, IV, at 14:28.  CONTRAST GIVEN: Isovue 170 ml.     Prepared and signed by     Lujean Amel, MD  Signed 03/22/2012 14:58:06     STUDY DIAGRAM     Angiographic findings  Native coronary lesions:   Proximal LAD: Lesion 1: 70 % stenosis.   Mid LAD: Lesion 1: diffuse, 70 % stenosis.   Distal LAD: Lesion 1: diffuse, 70 % stenosis.   D2: Lesion 1: 70 % stenosis.   Mid circumflex: Lesion 1: 60 % stenosis.   Distal circumflex: Lesion 1: 90 % stenosis.   Distal RCA: Lesion 1: 50 % stenosis.     HEMODYNAMIC TABLES     Pressures:  Baseline  Pressures:  - HR: 73  Pressures:  - Rhythm:  Pressures:  -- Aortic Pressure (S/D/M): 152/74/107  Pressures:  -- Left Ventricle (s/edp): 146/19/--     Outputs:  Baseline  Outputs:  -- CALCULATIONS[age]  Outputs:  -- CALCULATIONS: Body Surface Area: 1.80  Outputs:  -- CALCULATIONS:  Height in cm: 165.00  Outputs:  -- CALCULATIONS: Sex: Female  Outputs:  -- CALCULATIONS: Weight in kg: 72.60  Cardiology:    14-Jan-14 19:40, ED ECG   Ventricular Rate 102   Atrial Rate 102   P-R Interval 128   QRS Duration 80   QT 338   QTc 440   P Axis 51   R Axis 44   T Axis 31   ECG interpretation    Sinus tachycardia  ST & T wave abnormality, consider inferior ischemia  Abnormal ECG  When compared with ECG of 10-Jul-2009 15:36,  No significant change was found  ----------unconfirmed----------  Confirmed by OVERREAD, NOT ([id number]), editor PEARSON, BARBARA ([id number]) on 03/22/2012 9:27:00 AM   ED ECG   Routine Chem:    14-Jan-14 19:53, Comprehensive Metabolic Panel   Glucose, Serum 177   BUN 24   Creatinine (comp) 1.16   Sodium, Serum 140   Potassium, Serum 3.3   Chloride, Serum 106   CO2, Serum 23   Calcium (Total), Serum 9.3   Osmolality (calc) 288   eGFR (African American) 54   eGFR (Non-African American) 47   eGFR values <67mL/min/1.73 m2 may be an indication of chronic  kidney disease (CKD).  Calculated eGFR is useful in patients with stable renal function.  The eGFR calculation will not be reliable in acutely ill patients  when serum creatinine is changing rapidly. It is not useful in   patients on dialysis. The eGFR calculation may not be applicable  to patients at the low and high extremes of body sizes, pregnant  women, and vegetarians.   Anion Gap 11    14-Jan-14 19:53, Troponin I   Result Comment    troponin - RESULTS VERIFIED BY REPEAT TESTING.   Marland Kitchen Janeal Holmes Meyer 20:35 03-21-12 by LJW   - READ-BACK PROCESS PERFORMED.   Result(s) reported on 21 Mar 2012 at 08:38PM.    15-Jan-14 80:32, Basic Metabolic Panel (w/Total Calcium)   Glucose, Serum 122   BUN 25   Creatinine (comp) 0.98   Sodium, Serum 143   Potassium, Serum 3.8   Chloride, Serum 110   CO2, Serum  27   Calcium (Total), Serum 8.6   Anion Gap 6   Osmolality (calc) 291   eGFR (African American) >60    eGFR (Non-African American) 57   eGFR values <19mL/min/1.73 m2 may be an indication of chronic  kidney disease (CKD).  Calculated eGFR is useful in patients with stable renal function.  The eGFR calculation will not be reliable in acutely ill patients  when serum creatinine is changing rapidly. It is not useful in   patients on dialysis. The eGFR calculation may not be applicable  to patients at the low and high extremes of body sizes, pregnant  women, and vegetarians.    15-Jan-14 04:00, Hemoglobin A1c ([facility])   Hemoglobin A1c [facility]) 5.5   The American Diabetes Association recommends that a primary goal of  therapy should be <7% and that physicians should reevaluate the  treatment regimen in patients with HbA1c values consistently >8%.    15-Jan-14 04:00, Iron and IBC ([facility])   Iron Binding Capacity (TIBC) 434   Unbound Iron Binding Capacity 386   Iron, Serum 48   Iron Saturation 11   Result(s) reported on 22 Mar 2012 at 11:46AM.    15-Jan-14 04:00, Lipid Profile [facility])   Cholesterol, Serum 163   Triglycerides, Serum 75   HDL (INHOUSE) 39   VLDL Cholesterol Calculated 15   LDL Cholesterol Calculated 109   Result(s) reported on 22 Mar 2012 at 04:41AM.    15-Jan-14 04:00, Troponin I   Result Comment    troponin - RESULTS VERIFIED BY REPEAT TESTING.   - prev called 1/14/14at 2035 by [email or web address]   Result(s) reported on 22 Mar 2012 at 05:04AM.    15-Jan-14 11:12, Ferritin [facility])   Ferritin [facility]) 9   Result(s) reported on 22 Mar 2012 at 12:25PM.    15-Jan-14 11:12, Troponin I   Result Comment    TROPONIN - RESULTS VERIFIED BY REPEAT TESTING.   - RESULT PREVIOUSLY CALLED 03/21/12 AT   - 2035 BY [email or web address]   Result(s) reported on 22 Mar 2012 at 02:34PM.  Cardiac:    14-Jan-14 19:53, Cardiac Panel   CK, Total 297   CPK-MB, Serum 2.2   Result(s) reported on 21 Mar 2012 at 08:32PM.    14-Jan-14 19:53, Troponin I   Troponin I 0.05   0.00-0.05  0.05 ng/mL or less: NEGATIVE   Repeat  testing in 3-6 hrs   if clinically indicated.  >0.05 ng/mL: POTENTIAL   MYOCARDIAL INJURY. Repeat   testing in 3-6 hrs if   clinically indicated.  NOTE: An increase or decrease   of 30% or more on serial   testing suggests a   clinically important change    15-Jan-14 04:00, Cardiac Panel   CK, Total 264   CPK-MB, Serum 4.3   Result(s) reported on 22 Mar 2012 at 04:52AM.    15-Jan-14 04:00, Troponin I   Troponin I 2.69   0.00-0.05  0.05 ng/mL or less: NEGATIVE   Repeat testing in 3-6 hrs   if clinically indicated.  >0.05 ng/mL: POTENTIAL   MYOCARDIAL INJURY. Repeat   testing in 3-6 hrs if   clinically indicated.  NOTE: An increase or decrease   of 30% or more on serial   testing suggests a   clinically important change    15-Jan-14 11:12, Cardiac Panel   CK, Total 262   CPK-MB, Serum 4.2   Result(s) reported on 22 Mar 2012 at 02:16PM.    15-Jan-14 11:12, Troponin I   Troponin  I 4.40   0.00-0.05  0.05 ng/mL or less: NEGATIVE   Repeat testing in 3-6 hrs   if clinically indicated.  >0.05 ng/mL: POTENTIAL   MYOCARDIAL INJURY. Repeat   testing in 3-6 hrs if   clinically indicated.  NOTE: An increase or decrease   of 30% or more on serial   testing suggests a   clinically important change  Routine Hem:    14-Jan-14 19:53, Hemogram, Platelet Count   WBC (CBC) 10.7   RBC (CBC) 3.94   Hemoglobin (CBC) 8.9   Hematocrit (CBC) 29.6   Platelet Count (CBC) 416   Result(s) reported on 21 Mar 2012 at 08:04PM.   MCV 75   MCH 22.6   MCHC 30.2   RDW 16.7    15-Jan-14 04:00, CBC Profile   WBC (CBC) 8.6   RBC (CBC) 3.44   Hemoglobin (CBC) 8.3   Hematocrit (CBC) 25.5   Platelet Count (CBC) 342   MCV 74   MCH 24.1   MCHC 32.5   RDW 16.6   Neutrophil % 85.2   Lymphocyte % 8.7   Monocyte % 5.9   Eosinophil % 0.0   Basophil % 0.2   Neutrophil # 7.3   Lymphocyte # 0.7   Monocyte # 0.5   Eosinophil # 0.0   Basophil # 0.0   Result(s) reported on 22 Mar 2012 at 04:27AM.     15-Jan-14 11:45, Hemoglobin   Hemoglobin (CBC) 8.0   Result(s) reported on 22 Mar 2012 at 12:13PM.    16-Jan-14 09:13, Hematocrit   Hematocrit (CBC) 27.6   Result(s) reported on 23 Mar 2012 at 10:01AM.    16-Jan-14 09:13, Hemoglobin   Hemoglobin (CBC) 8.7   Result(s) reported on 23 Mar 2012 at 10:01AM.   RADIOLOGY:  Radiology Results: XRay:    14-Jan-14 19:58, Chest Portable Single View   Chest Portable Single View   REASON FOR EXAM:    chest pain  COMMENTS:       PROCEDURE: DXR - DXR PORTABLE CHEST SINGLE VIEW  - Mar 21 2012  7:58PM     RESULT: Comparison: None.    Findings:  The heart is at the upper limits normal in size. Mild prominence of the   right superior mediastinum is likely secondary to patient rotation.   Lucency overlying the heart is likely related to the large hiatal hernia   seen on the prior abdominal CT in 2011. Mild right basilar opacities may   be secondary to atelectasis.    IMPRESSION:   1. Mild right basilar opacities may be secondary to atelectasis. However,   followup PA and lateral chest radiograph is recommended.  2. Mild prominence of the right superior mediastinum is likely secondary   to patient rotation. However, recommend attention on followup chest   radiograph.  3. Moderate sized hiatal hernia.      Dictation Site: 8        Verified By: Gregor Hams, M.D., MD  LabUnknown:    09-Jan-14 14:32, MRI Shoulder Left Without Contrast   PACS Image    09-Jan-14 15:10, MRI Shoulder Right Without Constrast   PACS Image    14-Jan-14 19:58, Chest Portable Single View   PACS Image  MRI:    09-Jan-14 14:32, MRI Shoulder Left Without Contrast   MRI Shoulder Left Without Contrast   REASON FOR EXAM:    left shoulder pain  COMMENTS:       PROCEDURE: MR  - MR SHOULDER LT  WO CONTRAST  - Mar 16 2012  2:32PM     RESULT: History:  Left shoulder pain    Comparison: None    Technique:  Multiplanar and multisequence MRI of the left  shoulder.    Findings:      There is a partial articular surface tear of the anterior aspect of the   supraspinatus tendon. There is mild tendinosis of the supraspinatus     tendon. There is a tiny interstitial tear of the infraspinatus tendon at   the musculotendinous junction. The teres minor tendon is intact. The   subscapularis tendon is intact.      There is no atrophy or fatty replacement of, or abnormal signal within,   the muscles of the rotator cuff.      The labrum is grossly intact, but evaluation is limited by lack of   intraarticular fluid.      The intraarticular and extraarticular portions of the biceps tendon are   intact.      There is no significant glenohumeral joint effusion. There mild   degenerative changes of the glenohumeral joint. There are degenerative     changes of the acromioclavicular joint.    IMPRESSION:     1. There is a partial articular surface tear of the anterior aspect of   the supraspinatus tendon. There is mild tendinosis of the supraspinatus   tendon.     2. There is a tiny interstitial tear of the infraspinatus tendon at the   musculotendinous junction.    Dictation Site: 1        Verified By: Jennette Banker, M.D., MD    09-Jan-14 15:10, MRI Shoulder Right Without Constrast   MRI Shoulder Right Without Constrast   REASON FOR EXAM:    rt shoulder pain  COMMENTS:       PROCEDURE: MR  - MR SHOULDER RT  WO CONTRAST  - Mar 16 2012  3:10PM     RESULT: History:  Right shoulder pain    Comparison: None    Technique:  Multiplanar and multisequence MRI of the right shoulder.    Findings:     There is a tiny full thickness tear of the anterior aspect of the   supraspinatus tendon. There is mild tendinosis of the supraspinatus     tendon. There is a tiny interstitial tear of the infraspinatus tendon at   the musculotendinous junction. The teres minor tendon is intact. The   subscapularis tendon is intact.     There is no  atrophy or fatty replacement of, or abnormal signal within,   the muscles of the rotator cuff.     The labrum is grossly intact, but evaluation is limited by lack of   intraarticular fluid.     The intraarticular and extraarticular portions of the biceps tendon are   intact.     There is no significant glenohumeral joint effusion. There mild   degenerative changes of the glenohumeral joint. There are degenerative     changes of the acromioclavicular joint.    IMPRESSION:      1. There is a tiny full thickness tear of the anterior aspect of the   supraspinatus tendon. There is mild tendinosis of the supraspinatus   tendon.     2. There is atiny interstitial tear of the infraspinatus tendon at the   musculotendinous junction.    Dictation Site: 1        Verified By: Jennette Banker, M.D.,  MD  Edie:    12-Jun-13 17:01, Screening Digital Mammogram   Screening Digital Mammogram   REASON FOR EXAM:    scr mammo no order  COMMENTS:       PROCEDURE: MAM - MAM DGTL SCRN MAM NO ORDER W/CAD  - Aug 18 2011  5:01PM     RESULT:     Comparisons: 05/19/2010, 03/20/2009, and 02/08/2008.    Findings:     There is scattered fibroglandular tissue. No suspicious masses or   calcifications are identified. No areas of architectural distortion.    IMPRESSION:    BI-RADS: Category 1 - Negative    Recommend continued annual screening mammography.    A NEGATIVE MAMMOGRAM REPORT DOES NOT PRECLUDE BIOPSY OR OTHER EVALUATION   OF A CLINICALLY PALPABLE OR OTHERWISE SUSPICIOUS MASS OR LESION. BREAST   CANCER MAY NOT BE DETECTED BY MAMMOGRAPHY IN UP TO 10% OF CASES.    Thank you for the opportunity to contribute to the care of your patient.             Verified By: Gregor Hams, M.D., MD   ASSESSMENT AND PLAN:   Assessment/Admission Diagnosis bilateral iliac artery disease.  Both lesions I would estimate in the 80% range.   Has claudication.  No limb threatening symptoms.   Should allow femoral approach for treatment  bilateral renal artery stenosis, mild to moderate    Plan Proceed with cardiac care as needed. RTC 2-3 weeks with ABIs for baseline and discussion for outpatient treatment  Renal artery duplex in future.  If HTN becomes more difficult to control, may consider intervention in the future.   Electronic Signatures: Algernon Huxley (MD)  (Signed 16-Jan-14 12:18)  Authored: Chief Complaint and History, PAST MEDICAL/SURGICAL HISTORY, ALLERGIES, HOME MEDICATIONS, Family and Social History, Review of Systems, Physical Exam, LABS, RADIOLOGY, Assessment and Plan   Last Updated: 16-Jan-14 12:18 by Algernon Huxley (MD)

## 2014-06-28 NOTE — Discharge Summary (Signed)
PATIENT NAME:  [name] [name], [name] [name] MR#:  [id number] DATE OF BIRTH:  Jul 07, 1940  DATE OF ADMISSION:  03/21/2012 DATE OF DISCHARGE:  03/25/2012  ADMITTING DIAGNOSIS:  Chest pain.   DISCHARGE DIAGNOSES: 1.  Chest pain due to acute non-ST myocardial infarction.  2.  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  3.  Hypercholesterolemia.  4.  Hypertension.   5. Anemia, which is microcytic in nature with iron deficiency. The patient was seen by gastroenterology and she was guaiac-negative.  6.  Gastroesophageal reflux disease.   7.  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 She will follow-up with Dr. Leotis Pain as an outpatient.   CONSULTANTS: Dr. Nehemiah Massed, Dr. Lucky Cowboy, Dr. Candace Cruise.   PERTINENT LABORATORY EVALUATIONS: Admitting EKG showed sinus tachycardia with ST-T wave abnormalities suggesting possible inferior ischemia. CPK 297, CK-MB 2.2. Troponin 0.05, BMP glucose 177, BUN 24, creatinine 1.16, sodium 140, potassium 3.3, chloride 106, CO2 23, calcium 9.3. LFTs were normal. WBC 10.7, hemoglobin 8.9, platelet count 419.   Chest x-ray showed mild right basilar opacities, which may be secondary to atelectasis.   Vitamin B12 level 294. Iron binding was 434. Iron serum 48, iron saturation was 11, troponin subsequently was 2.69, total cholesterol 163, triglycerides 75, HDL 39, LDL 109. Hemoglobin A1c 5.5.   Cardiac cath report showed a diffuse 75 LAD lesion, circumflex was 95, mid to distal RCA 50%, mid to diffuse distal renal artery showed 75%, left and right 50%, right iliac was 95%, left iliac 70%. Repeat cath on the January 16, stent placed at the circumflex.   HOSPITAL COURSE: Please refer to history and physical done by the admitting physician. The patient is a 74 year old white female with a history hypertension, GERD who came to the ED with complaint of chest pain and nausea. The patient in the ED initially had  negative troponins, was admitted for suspicious angina. Subsequent cardiac enzymes ruled her in for non-ST-myocardial infarction. She was hospitalized and further evaluation was obtained. Cardiology consult was obtained. The patient was seen by cardiology and underwent a cardiac catheter, which showed multivessel disease, as outlined above. Dr. Nehemiah Massed felt that the culprit lesion was the left circumflex. He discussed the case at [facility] regarding other options, such as  bypass and according to his report, they recommended stent. The patient also was noted to have iron deficiency anemia. She has been recently, within the past 2 years, had an endoscopy and a colonoscopy. GI consult was obtained due to her being on aspirin and Plavix. They felt that due to her other blockages in her heart, they did not feel that she would be a good candidate for any evaluation with EGD or colonoscopy, unless she was noted to have bleeding. The patient was guaiac-negative. She tolerated aspirin and Plavix well without any evidence of bleeding. She has been started on iron. Also in the cardiac catheterization, she was noted to have peripheral vascular disease with iliac stenosis as well as renal artery stenosis. She was seen in consultation by Dr. Lucky Cowboy who recommended outpatient follow-up. There was an attempt to discharge her on January 17, but she did become little dizzy with getting up and her heart rate did drop, so her metoprolol dose was decreased after that. Today she did  better and was able to ambulate. She is doing currently stable and is stable for discharge.   DISCHARGE MEDICATIONS:  300, 1 tab p.o. daily, simvastatin 40 mg at bedtime,  Plavix 75 p.o. daily, aspirin 81 mg 1 tab p.o. daily, iron sulfate 325 mg 1 tab p.o. twice a day with meals, Imdur 30 mg q daily, ciprofloxacin 500 one tabs p.o. q.12 x3 days. This is for a possible urinary tract infection,  metoprolol 12.5, 1 tab p.o. b.i.d.  Patient is told to stop taking  amlodipine, bisoprolol and hydrochlorothiazide.   DIET:  Low sodium, low fat, low cholesterol.   ACTIVITY: As tolerated.   FOLLOW-UP:  Dr. Rosanna Randy to have a hemoglobin checked next week.  Follow with Dr. Nehemiah Massed in 2 to 4 weeks.  Follow with Dr. Lucky Cowboy in 2 to 4 weeks. Follow-up with Dr. Candace Cruise in 4 to 6 weeks for iron deficiency anemia.   TIME SPENT:  35 minutes spent.      ____________________________ Lafonda Mosses. Posey Pronto, MD shp:cc D: 03/25/2012 17:30:25 ET T: 03/26/2012 21:31:07 ET JOB#: [id number]  cc: Shamiracle Gorden H. Posey Pronto, MD, <Dictator> Dr. Jason Nest MD ELECTRONICALLY SIGNED 03/29/2012 11:22

## 2014-06-29 NOTE — H&P (Signed)
PATIENT NAME:  [name] [name], [name] MR#:  [id number] DATE OF BIRTH:  31-Jul-1940  PRIMARY DOCTOR: Richard L. Rosanna Randy, MD    CARDIOLOGIST:  Corey Skains, MD  CHIEF COMPLAINT: Chest heaviness day and weakness, fatigue.   HISTORY OF PRESENT ILLNESS: A 74 year old female who started to have chest heaviness today in the middle of the chest not radiating to the neck or the shoulders and the patient's chest pain lasted for a few minutes. Did not have any nausea, vomiting, or sweating. Relieved by itself.  The patient did not have cough or fever. The patient had history of coronary artery disease and a history of stent placement 2 years ago. The patient sees Dr. Nehemiah Massed on a regular basis. Recently saw him a week ago and her medications were adjusted. Her Imdur, metoprolol have been stopped and she was started on hydrochlorothiazide for blood pressure. She is still on aspirin, but Plavix has stopped. According to the patient, her last stress a year ago was normal.  The patient also complains of some dyspnea on exertion.   The patient has been having weakness, tiredness, for a week and not feeling well for about a month and recently getting worse. Denies any blood in the stools. The patient denies any diarrhea. No nausea. No vomiting. No reflux symptoms. The patient's hemoglobin is 7.2 here and also has elevated troponin of 0.07. Denies any other complaints, except mild dysuria, which happened a week ago. Denies any fever.   PAST MEDICAL HISTORY: Significant for history of hypertension, history of GERD, and  coronary artery disease. The patient was admitted in January 2014. At that time she had a cardiac catheterization which showed multivessel coronary artery disease and she had a stent placed in left circumflex at that time.  She also has history hyperlipidemia, hypertension.  History of iron deficiency anemia, history of peripheral vascular disease with bilateral iliac stents.  It showed mild to moderate  renal artery lesions as well.  ALLERGIES:  AMOXICILLIN.  SOCIAL HISTORY: The patient lives with the husband. No smoking. No drinking and no drugs.   FAMILY HISTORY: Father had heart disease at age of 65.   PAST SURGICAL HISTORY: Significant for stent placement, total abdominal hysterectomy.   MEDICATIONS: The patient takes her hydrochlorothiazide 12.5 mg daily which was started recently. She is also on Mobic 7.5 daily, omeprazole 20 mg daily, aspirin 81 mg p.o. daily,  atorvastatin 40 mg daily.  REVIEW OF SYSTEMS:   CONSTITUTIONAL: No fever. Does have some fatigue going on for a month.  EYES: No blurred vision.  EARS, NOSE, AND THROAT: No tinnitus. No ear pain. No epistaxis. No difficulty swallowing.  RESPIRATIONS: No cough. No wheezing.  CARDIOVASCULAR: Heaviness in the chest today. No orthopnea or PND. No palpitations.  GASTROINTESTINAL: The patient has no nausea. No vomiting. No diarrhea.  GENITOURINARY: No dysuria. The patient had some dysuria a week ago.  ENDOCRINE: No polyuria or nocturia.  HEMATOLOGIC: Does have anemia.  INTEGUMENTARY: No skin rashes.  MUSCULOSKELETAL: No joint pain.  NEUROLOGIC: No numbness or weakness.  PSYCHIATRIC: No anxiety or insomnia.   PHYSICAL EXAMINATION: VITAL SIGNS: Temperature 98.7, heart rate 85, blood pressure 118/56, saturation 99% on room air.  GENERAL:  Alert, awake, oriented, elderly female, well developed, well nourished, not in distress. Answers questions appropriately. HEAD: Atraumatic, normocephalic.  EYES: Pupils equal, reacting to light.  Slight conjunctival pallor present. No scleral icterus. Extraocular movements are intact.  NOSE: No nasal lesions. No drainage.  EARS: No drainage  or external lesions.  MOUTH: No lesions. No exudates.  NECK: Supple. No JVD. No carotid bruit. Normal range of motion. RESPIRATION:  Good inspiratory effort. Clear to auscultation. No wheeze or rales. CARDIOVASCULAR: S1, S2, regular. No murmurs. PMI not  displaced. The patient has good femoral and pedal pulses. No pedal edema.  GASTROINTESTINAL: Abdomen is soft, nontender, nondistended. Bowel sounds present. No organomegaly. No hernias.  MUSCULOSKELETAL: Normal gait and station, able to move all extremities. No tenderness or effusion.  SKIN: Inspection is normal, well hydrated.  VASCULAR: Good pedal pulses.  NEUROLOGIC: Cranial nerves II through XII intact. Power 5/5 in upper and lower extremities. Sensation intact. DTRs 2+ bilaterally.  PSYCHIATRIC: Mood and affect are within normal limits.   LABORATORY DATA: White count 7.1, hemoglobin 7.2, hematocrit 24.3, platelets 314,000. The patient's sodium is 144, potassium 3.4, chloride 108, bicarbonate 27, BUN 18, creatinine 1.49, glucose 106. Troponin 0.07.  Leukocyte esterase 3+ and also bacteria of 3+ and WBC 1180.   EKG: Normal sinus rhythm at 78 beats per minute, no ST-T changes.   ASSESSMENT AND PLAN: 1.  The patient is a 74 year old female patient with chest heaviness, slightly elevated troponins. Symptoms concerning for cardiac event with possible acute coronary syndrome or unstable angina. Admit the patient to hospital to hospitalist service, cycle the troponins 2 more times, and monitor on telemetry. Continue beta blockers, statins, and also nitroglycerin; unable to use aspirin or Plavix at this time secondary to anemia requiring transfusions.  2.  History of coronary artery disease and the patient had a stent in circumflex.  Cardiac catheterization was done 2 years ago which showed diffuse left anterior descending lesion and the patient had stent placed in circumflex. We will consult Dr. Nehemiah Massed for further recommendations.  3.  Anemia. The patient is feeling dizzy and she has symptomatic anemia. Hemoglobin is 7.2. The patient when she was here 2 years ago hemoglobin was 8.9 and she had any anemia workup done.  The patient was started on aspirin at that time. She had a colonoscopy done on June  2014.  It showed multiple small diverticula in sigmoid colon. The patient needed repeat colonoscopy in 5 years. She will be continued on proton pump inhibitors. Will check the anemia panel. Also see if she needs a gastrointestinal evaluation.  4.  Urinary tract infection. Continue antibiotics with Levaquin and follow urine cultures.  5. Mild acute renal failure. Hold hydrochlorothiazide and continue maintenance intravenous fluids and replace the potassium.  6.  The patient's baseline hemoglobin is around 8.8, which was done 2 years ago. The patient will be admitted to telemetry.   TIME SPENT: More than 55 minutes.    ____________________________ Epifanio Lesches, MD sk:LT D: 12/30/2013 18:57:01 ET T: 12/30/2013 20:45:00 ET JOB#: [id number]  cc: Epifanio Lesches, MD, <Dictator> Epifanio Lesches MD ELECTRONICALLY SIGNED 02/04/2014 11:28

## 2014-06-29 NOTE — Consult Note (Signed)
PATIENT NAME:  [name] [name], [name] [name] MR#:  [id number] DATE OF BIRTH:  [birth year]  DATE OF CONSULTATION:  12/31/2013  REQUESTING PHYSICIAN:  Dr. Posey Pronto CONSULTING PHYSICIAN:  Theodore Demark, NP  REASON FOR CONSULTATION:  For evaluation of iron deficiency anemia.   HISTORY OF PRESENT ILLNESS: Appreciate consult for 74 year old [place] American woman with a history of MI, CAD, PVD with both cardiac and bilateral iliac stenting,  IDA, hypertension for evaluation of IDA. Reports other than extreme fatigue, malaise, a chest heaviness she had prior to admission that improved after her blood transfusion today. She has had no abdominal pain, dyspepsia, melena, hematochezia, or any other GI complaints with the exception of mild dysphagia. States she was on Plavix until about a month ago, continues on ASA 81 mg p.o. daily for her PVD. Does take p.r.n. Mobic 7.5 mg p.o. p.r.n., says that she had taken this almost daily for a few days with an exacerbation of low back pain a few weeks ago, but not using often now. Takes Prilosec 20 mg p.o. when she takes Mobic. Denies other NSAID use. Has had endoscopic evaluation, EGD 2008, 2010, 2012; 2008 with gastritis. No H. pylori.  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 In  2012, procedure with tortuous esophagus with stenosis that was re-dilated. There was a large hiatal hernia, otherwise exam was normal. Had colonoscopy in 2006 with adenoma; colonoscopy in 2010, and most recently 2014 with diverticula only. Brother had colon cancer. She has a cardiac consult pending for evaluated troponin and her symptoms.   PAST MEDICAL HISTORY: Hypertension, GERD, CAD, cardiac stent in the left circumflex, hyperlipidemia, IDA, peripheral vascular disease, bilateral iliac stenting, moderate renal artery stenosis, total abdominal hysterectomy.   ALLERGIES: AMOXICILLIN.   SOCIAL HISTORY: Lives with husband. No illicits, alcohol,  tobacco.   FAMILY HISTORY: Father with heart disease, brother with colon cancer. No known family history of ulcers or liver disease.   MEDICATIONS: Hydrochlorothiazide 12.5 mg p.o. daily, Mobic 7.5 mg p.o. daily p.r.n., omeprazole 20 mg p.o. daily p.r.n., ASA 81 mg p.o. daily, atorvastatin 40 mg daily.  REVIEW OF SYSTEMS: Ten systems reviewed, unremarkable other than what is written above.   LABORATORY DATA: Most recent, glucose 101, BUN 18, creatinine 2.44, sodium 010, folic acid 27.2, potassium 4.1, GFR 55, calcium 8.9, magnesium 8.9,  ferritin 6. Troponin 0.07, CPK-MB is normal. WBC 7.2, hemoglobin 8.4, hematocrit 27.1. Red cells microcytic. UA significant for UTI.  She is on Levaquin for this currently.   PHYSICAL EXAMINATION: VITAL SIGNS: Most recent temperature 98, pulse 76, respiratory rate 18, blood pressure 102/67, SaO2 saturation 97% on room air.  GENERAL: A very pleasant elderly woman in no acute distress.  HEENT: Normocephalic, atraumatic. Conjunctivae pale pink. Mucous membranes pink and moist.  NECK: Supple. No JVD, thyromegaly, lymphadenopathy.  CHEST: Respirations eupneic. Lungs clear.  CARDIAC: S1, S2, RRR. No MRG. No appreciable edema.  ABDOMEN: Flat, bowel sounds x 4, nondistended, nontender, soft. No guarding, rigidity, peritoneal signs, hepatosplenomegaly, masses, or other abnormalities.  EXTREMITIES: No clubbing or cyanosis. MAEW x 4. Strength 5/5.  PSYCHIATRIC: Pleasant, calm, cooperative, good insight.  NEUROLOGICAL: Alert, oriented x 3. Cranial nerves II through XII intact. Speech clear. No facial droop.   IMPRESSION AND PLAN: Anemia exacerbation with her intermittent nonsteroidal anti-inflammatory drug use, recent anticoagulants, intermittent mild dysphagia, may benefit from endoscopic evaluation after cardiologist's opinion; however, we will discuss this further with Dr. Vira Agar. Agree with a daily  PPI for now. Recommend following hemoglobin and observing for  bleeding, transfusing p.r.n.   Thank you very much for this consult.   These services were provided by Stephens November, MSN, [facility], in collaboration with Dr. Vira Agar with whom I have discussed this patient in full.    ____________________________ Theodore Demark, NP chl:LT D: 12/31/2013 17:33:00 ET T: 12/31/2013 17:52:31 ET JOB#: [id number]  cc: Theodore Demark, NP, <Dictator> [place] SIGNED 01/28/2014 17:12

## 2014-06-29 NOTE — Discharge Summary (Signed)
PATIENT NAME:  [name], [name] MR#:  [id number] DATE OF BIRTH:  1940/12/28  DATE OF ADMISSION:  12/30/2013 DATE OF DISCHARGE:  01/01/2014  ADMITTING DIAGNOSIS: Weakness, chest pressure,   DISCHARGE DIAGNOSES:  1. Chest weakness, chest pressure due to severe symptomatic anemia, status post transfusion.  2. Iron deficiency anemia with guaiac-negative stools needs outpatient hematology followup and monitoring of her hemoglobin.  3. Known coronary artery disease, which has been medically managed.  4. Hypokalemia due to diuretic therapy.  5. Possible urinary tract infection with significant white blood cells in the urine. However, urine cultures negative.  6. Severe vitamin D deficiency.  7. Hypocalcemia.  8. Hypertension.  9. Hyperlipidemia. 10. Peripheral vascular disease with bilateral iliac stents.  11. Mild-to-moderate renal artery stenosis.   CONSULTANTS DURING HOSPITALIZATION: Dr. Clayborn Bigness, Dr. Vira Agar.   PERTINENT LABORATORIES AND EVALUATIONS: Admitting glucose 106, BUN 13, creatinine 1.49, sodium 144, potassium 3.4, chloride 108, CO2 of 27, calcium was 8.3, magnesium 1.9. Troponin was 0.07, then 0.07, and 0.07. WBC 7.1, hemoglobin 7.2, platelet count 314,000. Most recent hemoglobin on 10/27 was 8.1. Urine culture showed mixed results with no growth. Urinalysis showed 3+ leukocytes, WBCs 1180, bacteria 3+, vitamin B12 of 272, vitamin D levels D 25 hydroxy was 6.1. Blood for stool was negative.   EKG normal sinus rhythm without any ST-T wave changes.   HOSPITAL COURSE: Please refer to H and P done by the admitting physician. The patient is a 74 year old African American female with previous history of coronary artery disease with chronic occlusion medically managed. Presented to the ED with complaint of weakness and chest pressure. The patient also was noted to have significant anemia. She does have a history of iron deficiency anemia in the past, but her hemoglobin was worse. Due to  these symptoms, she was admitted to the hospital and a workup cardiac wise and her anemia was obtained. The patient was transfused with significant improvement in her symptoms. She was seen by cardiology who felt that her chest pain was related to anemia and known coronary artery disease. They recommended continuing current treatment. In terms of her anemia, she underwent transfusion, iron levels were very low. She started on iron. I will also have arranged for her to be seen by hematology. She had guaiac stools which were negative. She was seen by GI. They did not feel that she was having GI blood loss. They recommended a followup and no endoscopy at this point. She has had endoscopies in the past including EGD and colonoscopy in the past. At this time she is stable for discharge.   DISCHARGE MEDICATIONS: Aspirin 81 mg 1 tab p.o. daily, atorvastatin 40 at bedtime, omeprazole 20 daily, hydrochlorothiazide 12.5 p.o. daily, iron polysaccharide 150 mg 1 tab p.o. b.i.d., Cipro 500 mg 1 tab p.o. q.12 x 3 days vitamin D 2000 international units daily, calcium plus vitamin D 1 tab p.o. b.i.d.   DIET: Low sodium.   ACTIVITY: As tolerated.   FOLLOWUP: With primary MD in 1-2 weeks. Follow up at the cancer center for anemia with Dr. Inez Pilgrim in 2-4 weeks, [facility] in 2-4 weeks.   TIME SPENT: 35 minutes.    ____________________________ [name] Mosses Posey Pronto, MD shp:lt D: 01/02/2014 08:45:33 ET T: 01/02/2014 16:25:29 ET JOB#: [id number]  cc: Raffael Bugarin H. Posey Pronto, MD, <Dictator> Alric Seton MD ELECTRONICALLY SIGNED 01/12/2014 8:37

## 2014-06-29 NOTE — Consult Note (Signed)
Chief Complaint:  Subjective/Chief Complaint Patient states easily lipids disease today did not have any chest pain or shortness of breath due disease is when she still pretty try to walk. patient denies palpitations tachycardia   VITAL SIGNS/ANCILLARY NOTES: **Vital Signs.:   27-Oct-15 11:24  Vital Signs Type Routine  Temperature Temperature (F) 97.6  Celsius 36.4  Temperature Source oral  Pulse Pulse 66  Respirations Respirations 19  Systolic BP Systolic BP 811  Diastolic BP (mmHg) Diastolic BP (mmHg) 52  Mean BP 73  Pulse Ox % Pulse Ox % 98  Pulse Ox Activity Level  At rest  Oxygen Delivery Room Air/ 21 %  *Intake and Output.:   Daily 27-Oct-15 07:00  Grand Totals Intake:  770 Output:  950    Net:  -180 24 Hr.:  -180  Oral Intake      In:  600  IV (Primary)      In:  120  IV (Secondary)      In:  50  Urine ml     Out:  950  Length of Stay Totals Intake:  1032 Output:  950    Net:  82   Brief Assessment:  GEN well developed, well nourished, no acute distress   Cardiac Regular   Respiratory normal resp effort  clear BS  rhonchi   Gastrointestinal Normal   Gastrointestinal details normal Soft  Nontender  Nondistended  No masses palpable   EXTR negative cyanosis/clubbing, negative edema   Lab Results: Routine Micro:  26-Oct-15 11:26   Micro Text Report URINE CULTURE   COMMENT                   NO GROWTH IN 8-12 HOURS   ANTIBIOTIC                       Specimen Source CLEAN CATCH  Culture Comment NO GROWTH IN 8-12 HOURS  Result(s) reported on 01 Jan 2014 at 11:19AM.  General Ref:  26-Oct-15 00:56   Protein Immunoelectrophoresis, Serum ========== TEST NAME ==========  ========= RESULTS =========  = REFERENCE RANGE =  PROT IMMUNOELECTROPHORES   Vitamin D, 1,25 and 25-Hydroxy, Serum ========== TEST NAME ==========  ========= RESULTS =========  = REFERENCE RANGE =  VIT D, 1,25 & 25-HYDROXY  Vitamin D, 1,25 + 25-Hydroxy Calcitriol(1,25 di-OH Vit D)    [    Result Pending       ]                   Vitamin D, 25-Hydroxy  [L  6.1 ng/mL            ]        30.0-100.0 Vitamin D deficiency has been defined by the Institute of Medicine and an Endocrine Society practice guideline as a level of serum 25-OH vitamin D less than 20 ng/mL (1,2). The Endocrine Society wenton to further define vitamin D insufficiency as a level between 21 and 29 ng/mL (2). 1. IOM (Institute of Medicine). 2010. Dietary reference    intakes for calcium and D. [place]: The    [employer]. 2. Holick MF, Binkley , Bischoff-Ferrari HA, et al.    Evaluation, treatment, and prevention of vitamin D    deficiency: an Endocrine Society clinical practice    guideline. JCEM. 2011 Jul; 96(7):1911-30.               [employer]  No: [id number]       [address] [place], [place] [address]           Lindon Romp, MD         [phone]   Result(s) reported on 01 Jan 2014 at 08:53AM.  Routine Chem:  26-Oct-15 00:56   Magnesium, Serum 1.9 (1.8-2.4 THERAPEUTIC RANGE: 4-7 mg/dL TOXIC: > 10 mg/dL  -----------------------)  Folic Acid, Serum 42.7 (Result(s) reported on 31 Dec 2013 at 11:02AM.)  Result Comment TROPONIN - RESULTS VERIFIED BY REPEAT TESTING.  - ELEVATED TROPONIN PREVIOUSLY CALLED AT  - 1649 12/30/13.PMH  Result(s) reported on 31 Dec 2013 at 01:54AM.  Glucose, Serum  101  BUN 18  Creatinine (comp) 1.23  Sodium, Serum 144  Potassium, Serum 4.1  Chloride, Serum  108  CO2, Serum 30  Calcium (Total), Serum  8.1  Anion Gap  6  Osmolality (calc) 289  eGFR (African American)  55  eGFR (Non-African American)  45 (eGFR values <3mL/min/1.73 m2 may be an indication of chronic kidney disease (CKD). Calculated eGFR, using the MRDR Study equation, is useful in  patients with stable renal function. The eGFR calculation will not be reliable in acutely ill patients when serum creatinine is changing rapidly. It is not useful  in patients on dialysis. The eGFR calculation may not be applicable to patients at the low and high extremes of body sizes, pregnant women, and vetetarians.)  Cardiac:  26-Oct-15 00:56   CPK-MB, Serum 1.4 (Result(s) reported on 31 Dec 2013 at 01:46AM.)  Troponin I  0.07 (0.00-0.05 0.05 ng/mL or less: NEGATIVE  Repeat testing in 3-6 hrs  if clinically indicated. >0.05 ng/mL: POTENTIAL  MYOCARDIAL INJURY. Repeat  testing in 3-6 hrs if  clinically indicated. NOTE: An increase or decrease  of 30% or more on serial  testing suggests a  clinically important change)  Routine Sero:  26-Oct-15 22:17   Occult Blood, Feces NEGATIVE (Result(s) reported on 31 Dec 2013 at 11:23PM.)  Routine Hem:  26-Oct-15 00:56   WBC (CBC) 7.2  RBC (CBC)  3.67  Hemoglobin (CBC)  8.4  Hematocrit (CBC)  27.1  Platelet Count (CBC) 274  MCV  74  MCH  23.0  MCHC  31.2  RDW  17.8  Neutrophil % 64.5  Lymphocyte % 19.7  Monocyte % 10.7  Eosinophil % 3.6  Basophil % 1.5  Neutrophil # 4.7  Lymphocyte # 1.4  Monocyte # 0.8  Eosinophil # 0.3  Basophil # 0.1 (Result(s) reported on 31 Dec 2013 at 01:23AM.)    16:11   WBC (CBC) 7.8  RBC (CBC)  3.76  Hemoglobin (CBC)  8.6  Hematocrit (CBC)  28.1  Platelet Count (CBC) 295  MCV  75  MCH  22.8  MCHC  30.6  RDW  18.1  Neutrophil % 68.9  Lymphocyte % 18.3  Monocyte % 8.4  Eosinophil % 3.2  Basophil % 1.2  Neutrophil # 5.4  Lymphocyte # 1.4  Monocyte # 0.7  Eosinophil # 0.3  Basophil # 0.1 (Result(s) reported on 31 Dec 2013 at 04:50PM.)   Radiology Results: Cardiology:    25-Oct-15 16:20, ED ECG  Ventricular Rate 78  Atrial Rate 78  P-R Interval 124  QRS Duration 68  QT 402  QTc 458  R Axis 43  T Axis 48  ECG interpretation   Normal sinus rhythm  Normal ECG  When compared with ECG of 23-Mar-2012 13:15,  No significant change was found  ----------unconfirmed----------  Confirmed by  OVERREAD, NOT ([id number]), editor PEARSON, BARBARA ([id number]) on  12/31/2013 2:35:05 PM  ED ECG    Assessment/Plan:  Assessment/Plan:  Assessment IMP  the profound anemia  coronary disease  chest pain  shortness of breath  hyperlipidemia   urinary tract infection  renal insufficiency  vertigo .   Plan PLAN  recommend orthostatic blood pressures  follow-up H&H  coronary disease appears to be stable  recommend GI evaluation for anemia  continue statin therapy for hyperlipidemia  agree with antibiotics for UTI  consider follow-up with Nephrology for renal insufficiency  consider meclizine for mild vertigo  recommend outpatient evaluation for chest pain possible angina with a functional study  with patient has further symptoms that would consider inpatient Myoview  case discussed with her cardiologist Dr. Nehemiah Massed   Electronic Signatures: Lujean Amel D (MD)  (Signed 27-Oct-15 12:45)  Authored: Chief Complaint, VITAL SIGNS/ANCILLARY NOTES, Brief Assessment, Lab Results, Radiology Results, Assessment/Plan   Last Updated: 27-Oct-15 12:45 by Lujean Amel D (MD)

## 2014-06-29 NOTE — Consult Note (Signed)
Pt feeling much better since she had her transfusion.  No more chest pain, breathing better.  She likely dropped her hgb under the influence of Mobic and ASA.  She needs to be on PPI and Carafate (carafate for 2 months) and iron pills.  She has known heart disease and empiric treatment would be best.  She denies any abd pain at this time.  Will be glad to follow up with her in a month or two.  If overt bleeding occurs can do EGD at any time.  Electronic Signatures: Manya Silvas (MD)  (Signed on 26-Oct-15 19:59)  Authored  Last Updated: 26-Oct-15 19:59 by Manya Silvas (MD)

## 2014-06-29 NOTE — Consult Note (Signed)
Brief Consult Note: Diagnosis: anemia exacerbation.   Patient was seen by consultant.   Consult note dictated.   Comments: Appreciate consult for 31 y/ African American woman with history of MI, CAD/PVD with both cardiac and bilateral iliac stenting, IDA, htn, for evaluation of IDA.  Reports other than the extreme fatigue/malaise and chest heaviness she had prior to admission that improved after blood transfusion today, she has had no abdominal pain, dyspespia, melena/hemataochezia, or any other GI complaint with the excpetion of mild dysphagia. States she was on Plavix until about a month ago, continues on 81mg  ASA for her PVD. Does take prn Mobic 7.5mg  po prn, says that she had taken almost daily for a few days with an exacerbation of low back pain a few wk ago but not using ofter now. States she takes Prilosec 20mg  po when she takes the Mobic. Denies other NSAIDs.  Has had endscopic eval: EGDs 2008, 2010, 2010. 2008 with gastritis, no H pylori. 2010 with esophageal stenosis/La grade A esophagitis that was dilated, gastritis, duodenitis. 2012 with tortuous esophagus with stenosis, redilated. 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 Otherwise exam normal. Colonsocopy 2006 with adenoma. Colonoscopies 2010 and most recently 2014 with diverticula only. Brother had colon cancer.  VSS, afebrile. Abdomen nd/nt. Hgb improved last pm. Has cardiac consult pending for elevated troponin and sx.  Impression and Plan: Anemia exacerbation. With her intermittent NSAID use/ recent anticoagulants, intermittent mild dysphagia, may benefit from endoscopic eval after cardiologist opinion. Will discuss further with Dr Tiffany Kocher.  Agree with daily PPI for now, recommend following hgb, observing for bleeding, xfusion prn  Addendum: did d/w Dr Fonnie Jarvis: no endo plans for now- will see what cardiologist opinion is. Thanks.  Electronic Signatures: Stephens November H (NP)  (Signed 26-Oct-15 17:34)  Authored: Brief Consult Note   Last  Updated: 26-Oct-15 17:34 by Theodore Demark (NP)

## 2014-06-29 NOTE — Consult Note (Signed)
PATIENT NAME:  [name] [name], [name] [name] MR#:  [id number] DATE OF BIRTH:  08-Aug-1940  DATE OF CONSULTATION:  12/31/2013  CONSULTING PHYSICIAN:  Aviv Rota D. Clayborn Bigness, MD  REFERRING PHYSICIAN: Dr. Rosanna Randy.   CARDIOLOGIST: Dr. Nehemiah Massed.   INDICATION: Chest pain, angina, atrial fibrillation.   HISTORY OF PRESENT ILLNESS: The patient is a 74 year old obese white female with a history of chest pain, angina, neck pain, shoulder pain who presented to the Emergency Room was also found to be in rapid atrial fibrillation. The patient denied nausea, vomiting, and sweating, eventually it went away. The patient did not have any cough or fever. The patient had a history of coronary artery disease, history of stent placement 2 years ago. She was seen by Nehemiah Massed on a regular basis. Recently saw him about a week ago. Adjusted her medications. She has been on Imdur, metoprolol, which was started on hydrochlorothiazide for blood pressure. The patient was still on aspirin but Plavix had been discontinued. The patient last stress was maybe a year ago, which was normal, complained of some dyspnea on exertion, anginal symptoms, shortness of breath. The patient had some weakness, tiredness for about a month, which was getting progressively worse. Denied nausea, vomiting, or diarrhea. Hemoglobin was found to be about 7.2. Troponin was 0.02.   PAST MEDICAL HISTORY: GERD, coronary artery disease, obesity, angina, GI bleeding in the past, hyperlipidemia, hypertension, iron-deficiency anemia, peripheral vascular disease, mild-to-moderate renal artery stenosis.   SOCIAL HISTORY: Lives with her husband. No smoking or alcohol consumption.   FAMILY HISTORY: Heart disease in her father.   PAST SURGICAL HISTORY: Stent placement, hysterectomy.   MEDICATIONS: Hydrochlorothiazide 12.5 mg a day, Mobic 7.5 mg a day, omeprazole 20 a day, aspirin 81 mg a day, atorvastatin 40 a day.   REVIEW OF SYSTEMS: Denies blackout spells or syncope. Denies  nausea or vomiting. No fever, no chills, no sweats, no weight loss, no weight gain, hemoptysis, hematemesis. No bright red blood per rectum. No vision change or hearing change. Denies sputum production. Denies cough.   PHYSICAL EXAMINATION:  VITAL SIGNS: Blood pressure 120/60, pulse of 85, respiratory rate of 16, afebrile.  HEENT: Normocephalic, atraumatic. Pupils equal and reactive to light.  NECK: Supple. No significant JVD, bruits, or adenopathy.  LUNGS: Clear to auscultation and percussion. No significant wheeze, rhonchi, or rale.  HEART: Regular rate and rhythm. Positive bowel sounds. No rebound, guarding, or tenderness.  EXTREMITIES:WNL intact. SKIN: Normal.  LABORATORY DATA: White count 7, hemoglobin 7.2, hematocrit 24, platelet count of 314,000, sodium 144, potassium 3.4, chloride of 108, bicarbonate of 27, BUN of 18, creatinine 1.49, glucose of 106. Troponin 0.07. UA possible urinary tract infection.   EKG: Normal sinus rhythm, nonspecific ST-T changes.   ASSESSMENT: Unstable angina, anemia, coronary artery disease, peripheral vascular disease, possible gastrointestinal bleed, urinary tract infection, renal insufficiency, hyperlipidemia.   PLAN:  1. Agree with admit. Rule out for myocardial infarction. Continue  studies. Follow up telemetry, EKG. Recommend aspirin therapy. Recommend beta-blockade therapy. Can hold off on ACE inhibitor because of renal insufficiency. Consider nitrates as necessary. Would recommend treatment of anemia, which may be worsening her anginal symptoms too for anemia. I recommend transfusion at least to H and H of 8 and 24, preferably 9 and 27.  2. Have the patient follow up with GI for possible GI bleeding. Will probably need a scope as long as the patient has no further anginal symptoms, she should be an acceptable risk for scope. 3. Coronary artery disease, known PCI  and stent in the past just on aspirin. Do not recommend Plavix therapy. Continue beta  blockers. Continue nitrates. Would consider cardiac catheterization if symptoms persist. If not, functional study as an outpatient may be helpful.  4. Echocardiogram would be helpful for evaluation of possible anginal symptoms as well.  5. Renal insufficiency. Continue to followup BUN and creatinine. Will refer to nephrology for further evaluation and management and refrain from ACE inhibitors at this point. Hold off on diuretics, which may be worsening renal insufficiency. 6. For UTI, continue antibiotic therapy and follow up urine cultures.  7. If the patient is able to walk around without any further symptoms, would then consider evaluation as an outpatient, possible with the functional study. If she has recurrent symptoms, recommend cardiac catheterization. Consider omeprazole therapy as well for possible reflux symptoms and GI bleeding. Will continue to follow the patient. We will discuss the case with her cardiologist Nehemiah Massed.    ____________________________ Loran Senters. Clayborn Bigness, MD ddc:lt D: 12/31/2013 17:15:42 ET T: 12/31/2013 19:40:27 ET JOB#: [id number]  cc: Aarit Kashuba D. Clayborn Bigness, MD, <Dictator> Yolonda Kida MD ELECTRONICALLY SIGNED 02/04/2014 9:43

## 2014-07-12 DIAGNOSIS — M1990 Unspecified osteoarthritis, unspecified site: Secondary | ICD-10-CM | POA: Insufficient documentation

## 2014-07-12 DIAGNOSIS — M791 Myalgia, unspecified site: Secondary | ICD-10-CM | POA: Insufficient documentation

## 2014-07-12 DIAGNOSIS — R0602 Shortness of breath: Secondary | ICD-10-CM | POA: Insufficient documentation

## 2014-07-12 DIAGNOSIS — D649 Anemia, unspecified: Secondary | ICD-10-CM | POA: Insufficient documentation

## 2014-07-12 DIAGNOSIS — R5381 Other malaise: Secondary | ICD-10-CM | POA: Insufficient documentation

## 2014-07-12 DIAGNOSIS — R42 Dizziness and giddiness: Secondary | ICD-10-CM | POA: Insufficient documentation

## 2014-07-12 DIAGNOSIS — R5383 Other fatigue: Secondary | ICD-10-CM

## 2014-07-12 DIAGNOSIS — M7120 Synovial cyst of popliteal space [Baker], unspecified knee: Secondary | ICD-10-CM | POA: Insufficient documentation

## 2014-07-12 DIAGNOSIS — I219 Acute myocardial infarction, unspecified: Secondary | ICD-10-CM | POA: Insufficient documentation

## 2014-07-12 DIAGNOSIS — K219 Gastro-esophageal reflux disease without esophagitis: Secondary | ICD-10-CM | POA: Insufficient documentation

## 2014-07-12 DIAGNOSIS — I10 Essential (primary) hypertension: Secondary | ICD-10-CM | POA: Insufficient documentation

## 2014-07-12 DIAGNOSIS — J309 Allergic rhinitis, unspecified: Secondary | ICD-10-CM | POA: Insufficient documentation

## 2014-07-12 DIAGNOSIS — E7800 Pure hypercholesterolemia, unspecified: Secondary | ICD-10-CM | POA: Insufficient documentation

## 2014-07-12 DIAGNOSIS — I739 Peripheral vascular disease, unspecified: Secondary | ICD-10-CM | POA: Insufficient documentation

## 2014-07-12 DIAGNOSIS — J449 Chronic obstructive pulmonary disease, unspecified: Secondary | ICD-10-CM | POA: Insufficient documentation

## 2014-07-12 DIAGNOSIS — Z86010 Personal history of colonic polyps: Secondary | ICD-10-CM | POA: Insufficient documentation

## 2014-07-12 DIAGNOSIS — K449 Diaphragmatic hernia without obstruction or gangrene: Secondary | ICD-10-CM | POA: Insufficient documentation

## 2014-07-12 DIAGNOSIS — I2510 Atherosclerotic heart disease of native coronary artery without angina pectoris: Secondary | ICD-10-CM | POA: Insufficient documentation

## 2014-08-18 ENCOUNTER — Observation Stay
Admission: EM | Admit: 2014-08-18 | Discharge: 2014-08-20 | Disposition: A | Discharge status: Home / Self Care | Payer: Commercial Managed Care - HMO | Attending: Internal Medicine | Admitting: Internal Medicine

## 2014-08-18 ENCOUNTER — Emergency Department: Payer: Commercial Managed Care - HMO

## 2014-08-18 DIAGNOSIS — Z87891 Personal history of nicotine dependence: Secondary | ICD-10-CM | POA: Diagnosis not present

## 2014-08-18 DIAGNOSIS — E780 Pure hypercholesterolemia: Secondary | ICD-10-CM | POA: Insufficient documentation

## 2014-08-18 DIAGNOSIS — E785 Hyperlipidemia, unspecified: Secondary | ICD-10-CM | POA: Diagnosis not present

## 2014-08-18 DIAGNOSIS — Z800 Family history of malignant neoplasm of digestive organs: Secondary | ICD-10-CM | POA: Diagnosis not present

## 2014-08-18 DIAGNOSIS — Z888 Allergy status to other drugs, medicaments and biological substances status: Secondary | ICD-10-CM | POA: Diagnosis not present

## 2014-08-18 DIAGNOSIS — J449 Chronic obstructive pulmonary disease, unspecified: Secondary | ICD-10-CM | POA: Diagnosis not present

## 2014-08-18 DIAGNOSIS — Z7982 Long term (current) use of aspirin: Secondary | ICD-10-CM | POA: Insufficient documentation

## 2014-08-18 DIAGNOSIS — R42 Dizziness and giddiness: Secondary | ICD-10-CM | POA: Insufficient documentation

## 2014-08-18 DIAGNOSIS — R410 Disorientation, unspecified: Secondary | ICD-10-CM | POA: Insufficient documentation

## 2014-08-18 DIAGNOSIS — Z8371 Family history of colonic polyps: Secondary | ICD-10-CM | POA: Diagnosis not present

## 2014-08-18 DIAGNOSIS — D509 Iron deficiency anemia, unspecified: Secondary | ICD-10-CM | POA: Diagnosis not present

## 2014-08-18 DIAGNOSIS — Z8489 Family history of other specified conditions: Secondary | ICD-10-CM | POA: Insufficient documentation

## 2014-08-18 DIAGNOSIS — R5381 Other malaise: Secondary | ICD-10-CM | POA: Insufficient documentation

## 2014-08-18 DIAGNOSIS — Z8249 Family history of ischemic heart disease and other diseases of the circulatory system: Secondary | ICD-10-CM | POA: Diagnosis not present

## 2014-08-18 DIAGNOSIS — I2510 Atherosclerotic heart disease of native coronary artery without angina pectoris: Secondary | ICD-10-CM | POA: Insufficient documentation

## 2014-08-18 DIAGNOSIS — I10 Essential (primary) hypertension: Secondary | ICD-10-CM | POA: Diagnosis not present

## 2014-08-18 DIAGNOSIS — E860 Dehydration: Secondary | ICD-10-CM | POA: Insufficient documentation

## 2014-08-18 DIAGNOSIS — M791 Myalgia: Secondary | ICD-10-CM | POA: Diagnosis not present

## 2014-08-18 DIAGNOSIS — N289 Disorder of kidney and ureter, unspecified: Secondary | ICD-10-CM | POA: Insufficient documentation

## 2014-08-18 DIAGNOSIS — I252 Old myocardial infarction: Secondary | ICD-10-CM | POA: Insufficient documentation

## 2014-08-18 DIAGNOSIS — I739 Peripheral vascular disease, unspecified: Secondary | ICD-10-CM | POA: Diagnosis not present

## 2014-08-18 DIAGNOSIS — N390 Urinary tract infection, site not specified: Principal | ICD-10-CM

## 2014-08-18 DIAGNOSIS — R4182 Altered mental status, unspecified: Secondary | ICD-10-CM | POA: Insufficient documentation

## 2014-08-18 DIAGNOSIS — J309 Allergic rhinitis, unspecified: Secondary | ICD-10-CM | POA: Diagnosis not present

## 2014-08-18 DIAGNOSIS — Z880 Allergy status to penicillin: Secondary | ICD-10-CM | POA: Insufficient documentation

## 2014-08-18 DIAGNOSIS — K219 Gastro-esophageal reflux disease without esophagitis: Secondary | ICD-10-CM | POA: Diagnosis not present

## 2014-08-18 DIAGNOSIS — H538 Other visual disturbances: Secondary | ICD-10-CM | POA: Insufficient documentation

## 2014-08-18 DIAGNOSIS — E119 Type 2 diabetes mellitus without complications: Secondary | ICD-10-CM | POA: Insufficient documentation

## 2014-08-18 DIAGNOSIS — M1990 Unspecified osteoarthritis, unspecified site: Secondary | ICD-10-CM | POA: Insufficient documentation

## 2014-08-18 DIAGNOSIS — R0602 Shortness of breath: Secondary | ICD-10-CM | POA: Insufficient documentation

## 2014-08-18 DIAGNOSIS — Z79899 Other long term (current) drug therapy: Secondary | ICD-10-CM | POA: Insufficient documentation

## 2014-08-18 DIAGNOSIS — R079 Chest pain, unspecified: Secondary | ICD-10-CM | POA: Diagnosis not present

## 2014-08-18 DIAGNOSIS — T83511A Infection and inflammatory reaction due to indwelling urethral catheter, initial encounter: Secondary | ICD-10-CM

## 2014-08-18 DIAGNOSIS — Z86010 Personal history of colonic polyps: Secondary | ICD-10-CM | POA: Insufficient documentation

## 2014-08-18 HISTORY — DX: Acute myocardial infarction, unspecified: I21.9

## 2014-08-18 HISTORY — DX: Essential (primary) hypertension: I10

## 2014-08-18 HISTORY — DX: Hyperlipidemia, unspecified: E78.5

## 2014-08-18 LAB — CBC
HCT: 31.3 % — ABNORMAL LOW (ref 35.0–47.0)
HCT: 30 % — ABNORMAL LOW (ref 35.0–47.0)
HEMOGLOBIN: 9.4 g/dL — AB (ref 12.0–16.0)
Hemoglobin: 9.7 g/dL — ABNORMAL LOW (ref 12.0–16.0)
MCH: 25 pg — AB (ref 26.0–34.0)
MCH: 25.3 pg — AB (ref 26.0–34.0)
MCHC: 30.9 g/dL — AB (ref 32.0–36.0)
MCHC: 31.4 g/dL — ABNORMAL LOW (ref 32.0–36.0)
MCV: 81 fL (ref 80.0–100.0)
MCV: 80.5 fL (ref 80.0–100.0)
Platelets: 279 10*3/uL (ref 150–440)
Platelets: 255 10*3/uL (ref 150–440)
RBC: 3.87 MIL/uL (ref 3.80–5.20)
RBC: 3.73 MIL/uL — ABNORMAL LOW (ref 3.80–5.20)
RDW: 16.7 % — AB (ref 11.5–14.5)
RDW: 16.9 % — ABNORMAL HIGH (ref 11.5–14.5)
WBC: 9.7 10*3/uL (ref 3.6–11.0)
WBC: 8 10*3/uL (ref 3.6–11.0)

## 2014-08-18 LAB — BASIC METABOLIC PANEL
Anion gap: 8 (ref 5–15)
BUN: 20 mg/dL (ref 6–20)
CALCIUM: 9 mg/dL (ref 8.9–10.3)
CHLORIDE: 111 mmol/L (ref 101–111)
CO2: 24 mmol/L (ref 22–32)
Creatinine, Ser: 1.35 mg/dL — ABNORMAL HIGH (ref 0.44–1.00)
GFR, EST AFRICAN AMERICAN: 43 mL/min — AB (ref >60)
GFR, EST NON AFRICAN AMERICAN: 37 mL/min — AB (ref >60)
Glucose, Bld: 111 mg/dL — ABNORMAL HIGH (ref 65–99)
Potassium: 3.8 mmol/L (ref 3.5–5.1)
Sodium: 143 mmol/L (ref 135–145)

## 2014-08-18 LAB — URINALYSIS COMPLETE WITH MICROSCOPIC (ARMC ONLY)
Bilirubin Urine: NEGATIVE
GLUCOSE, UA: NEGATIVE mg/dL
Ketones, ur: NEGATIVE mg/dL
Nitrite: NEGATIVE
PH: 5 (ref 5.0–8.0)
Protein, ur: 30 mg/dL — AB
SPECIFIC GRAVITY, URINE: 1.023 (ref 1.005–1.030)

## 2014-08-18 LAB — CREATININE, SERUM
Creatinine, Ser: 1.27 mg/dL — ABNORMAL HIGH (ref 0.44–1.00)
GFR calc Af Amer: 47 mL/min — ABNORMAL LOW (ref >60)
GFR, EST NON AFRICAN AMERICAN: 40 mL/min — AB (ref >60)

## 2014-08-18 LAB — TROPONIN I: Troponin I: <0.03 ng/mL (ref <0.031)

## 2014-08-18 MED ORDER — FERROUS SULFATE 325 (65 FE) MG PO TABS
325.0000 mg | ORAL_TABLET | Freq: Every day | ORAL | Status: DC
  Administered 2014-08-18 – 2014-08-20 (×3): 325 mg via ORAL
  Filled 2014-08-18 (×3): qty 1

## 2014-08-18 MED ORDER — CEFTRIAXONE SODIUM IN DEXTROSE 20 MG/ML IV SOLN
1.0000 g | INTRAVENOUS | Status: DC
Start: 2014-08-19 — End: 2014-08-20
  Administered 2014-08-19: 17:00:00 1 g via INTRAVENOUS
  Filled 2014-08-18: qty 50

## 2014-08-18 MED ORDER — HEPARIN SODIUM (PORCINE) 5000 UNIT/ML IJ SOLN
5000.0000 [IU] | Freq: Three times a day (TID) | INTRAMUSCULAR | Status: DC
Start: 2014-08-18 — End: 2014-08-19
  Administered 2014-08-18 – 2014-08-19 (×2): 5000 [IU] via SUBCUTANEOUS
  Filled 2014-08-18 (×2): qty 1

## 2014-08-18 MED ORDER — ATORVASTATIN CALCIUM 20 MG PO TABS
40.0000 mg | ORAL_TABLET | Freq: Every day | ORAL | Status: DC
  Administered 2014-08-18 – 2014-08-19 (×2): 40 mg via ORAL
  Filled 2014-08-18 (×2): qty 2

## 2014-08-18 MED ORDER — MECLIZINE HCL 25 MG PO TABS: 25.0000 mg | ORAL_TABLET | Freq: Three times a day (TID) | ORAL | Status: DC | PRN

## 2014-08-18 MED ORDER — CEFTRIAXONE SODIUM IN DEXTROSE 20 MG/ML IV SOLN
1.0000 g | Freq: Once | INTRAVENOUS | Status: AC
  Administered 2014-08-18: 1 g via INTRAVENOUS

## 2014-08-18 MED ORDER — MELOXICAM 7.5 MG PO TABS: 7.5000 mg | ORAL_TABLET | Freq: Two times a day (BID) | ORAL | Status: DC | PRN

## 2014-08-18 MED ORDER — CEFTRIAXONE SODIUM IN DEXTROSE 20 MG/ML IV SOLN: 1.0000 g | Freq: Once | INTRAVENOUS | Status: DC

## 2014-08-18 MED ORDER — LISINOPRIL 10 MG PO TABS
10.0000 mg | ORAL_TABLET | Freq: Every day | ORAL | Status: DC
  Administered 2014-08-19 – 2014-08-20 (×2): 10 mg via ORAL
  Filled 2014-08-18 (×3): qty 1

## 2014-08-18 MED ORDER — ASPIRIN EC 81 MG PO TBEC
81.0000 mg | DELAYED_RELEASE_TABLET | Freq: Every day | ORAL | Status: DC
  Administered 2014-08-19 – 2014-08-20 (×2): 81 mg via ORAL
  Filled 2014-08-18 (×2): qty 1

## 2014-08-18 MED ORDER — CEFTRIAXONE SODIUM IN DEXTROSE 20 MG/ML IV SOLN
INTRAVENOUS | Status: AC
  Administered 2014-08-18: 1 g via INTRAVENOUS
  Filled 2014-08-18: qty 50

## 2014-08-18 MED ORDER — ACETAMINOPHEN 650 MG RE SUPP: 650.0000 mg | Freq: Four times a day (QID) | RECTAL | Status: DC | PRN

## 2014-08-18 MED ORDER — PANTOPRAZOLE SODIUM 40 MG PO TBEC
40.0000 mg | DELAYED_RELEASE_TABLET | Freq: Every day | ORAL | Status: DC
  Administered 2014-08-18 – 2014-08-20 (×3): 40 mg via ORAL
  Filled 2014-08-18 (×3): qty 1

## 2014-08-18 MED ORDER — POTASSIUM CHLORIDE IN NACL 20-0.9 MEQ/L-% IV SOLN
INTRAVENOUS | Status: DC
  Administered 2014-08-18: 19:00:00 via INTRAVENOUS
  Filled 2014-08-18 (×4): qty 1000

## 2014-08-18 MED ORDER — ASPIRIN 81 MG PO TABS
81.0000 mg | ORAL_TABLET | Freq: Every day | ORAL | Status: DC
  Filled 2014-08-18 (×2): qty 1

## 2014-08-18 MED ORDER — ACETAMINOPHEN 325 MG PO TABS
650.0000 mg | ORAL_TABLET | Freq: Four times a day (QID) | ORAL | Status: DC | PRN
  Administered 2014-08-19 (×2): 650 mg via ORAL
  Filled 2014-08-18 (×2): qty 2

## 2014-08-18 NOTE — ED Notes (Signed)
Patient transported to CT 

## 2014-08-18 NOTE — ED Notes (Signed)
Patient states she was dizzy and called EMS.  Per her husband, she became very "unusual and started taking her clothes off".  Patient states she does not remember any of this.  Patient A&Ox4 currently.  Patient and husband explained that her initial symptoms went away after 15 minutes.

## 2014-08-18 NOTE — ED Provider Notes (Signed)
[facility] Emergency Department Provider Note  Time seen: 3:15 PM  I have reviewed the triage vital signs and the nursing notes.   HISTORY  Chief Complaint Dizziness    HPI [name] [name] is a 74 y.o. female with a past medical history of hypertension, hyperlipidemia, MI 2 years ago presents the emergency department for dizziness and confusion. According to the patient around 10:30 AM she became acutely dizzy with blurred vision, and confused. This lasted approximately 10-15 minutes, and by the time EMS arrived the patient felt back to normal and refused transport at that time. However approximately 2 hours later symptoms recurred with dizziness, confusion, per the husband the patient started taking off her clothes and did not appear to know what she was doing or where she was. The patient does not recall any of these events. This lasted approximately 15 minutes, and symptoms resolved once EMS had arrived. As this was a second episode the patient decided to come to the emergency department for evaluation. Patient denies any weakness or numbness at any time during the episode, denies any chest pain, shortness breath, nausea, diaphoresis or any time today. Patient denies any symptoms currently, and feels back to normal. Describes her symptoms as moderate when they did occur.     Past Medical History  Diagnosis Date  . MI (myocardial infarction)   . Hypertension   . Hyperlipidemia     Patient Active Problem List   Diagnosis Date Noted  . Allergic rhinitis 07/12/2014  . Absolute anemia 07/12/2014  . Baker's cyst of knee 07/12/2014  . Atherosclerosis of coronary artery 07/12/2014  . CAFL (chronic airflow limitation) 07/12/2014  . Dizziness 07/12/2014  . Essential (primary) hypertension 07/12/2014  . Acid reflux 07/12/2014  . 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 History of colon polyps 07/12/2014  . Hypercholesteremia 07/12/2014  . Malaise and fatigue  07/12/2014  . Heart attack 07/12/2014  . Muscle ache 07/12/2014  . Arthritis, degenerative 07/12/2014  . Peripheral blood vessel disorder 07/12/2014  . Breath shortness 07/12/2014    Past Surgical History  Procedure Laterality Date  . Appendectomy    . Abdominal hysterectomy    . Cystoscopy    . Tonsillectomy    . Coronary angioplasty with stent placement      Current Outpatient Rx  Name  Route  Sig  Dispense  Refill  . aspirin 81 MG tablet   Oral   Take 1 tablet by mouth daily.         Marland Kitchen atorvastatin (LIPITOR) 40 MG tablet   Oral   Take 1 tablet by mouth at bedtime.         . ferrous sulfate 325 (65 FE) MG tablet   Oral   Take 1 tablet by mouth daily.         Marland Kitchen lisinopril (PRINIVIL,ZESTRIL) 10 MG tablet   Oral   Take 1 tablet by mouth daily.         . meloxicam (MOBIC) 7.5 MG tablet   Oral   Take 1 tablet by mouth 2 (two) times daily as needed.         . Omeprazole 20 MG TBEC   Oral   Take 1 capsule by mouth daily.           Allergies Celebrex ; Penicillins; and Prevacid   Family History  Problem Relation Age of Onset  . Hyperlipidemia Mother   . Allergies Mother   . Cerebral aneurysm Mother  cause of death at age 78  . Heart disease Father     Fatal MI ag 54  . Cancer Brother     colon-diagnosed age 17  . Healthy Son   . Hyperlipidemia Brother   . Colonic polyp Brother   . Healthy Son     Social History History  Substance Use Topics  . Smoking status: Former Research scientist (life sciences)  . Smokeless tobacco: Never Used  . Alcohol Use: No    Review of Systems Constitutional: Negative for fever. Positive for dizziness now resolved. Eyes: Positive for blurred vision, now resolved. Cardiovascular: Negative for chest pain. Respiratory: Negative for shortness of breath. Gastrointestinal: Negative for abdominal pain, vomiting and diarrhea. Genitourinary: Negative for dysuria. Neurological: Negative for headaches, focal weakness or numbness. Positive  for dizziness. 10-point ROS otherwise negative.  ____________________________________________   PHYSICAL EXAM:  VITAL SIGNS: ED Triage Vitals  Enc Vitals Group     BP 08/18/14 1349 141/72 mmHg     Pulse Rate 08/18/14 1349 88     Resp 08/18/14 1349 17     Temp 08/18/14 1349 98.1 F (36.7 C)     Temp Source 08/18/14 1349 Oral     SpO2 08/18/14 1349 99 %     Weight 08/18/14 1333 155 lb (70.308 kg)     Height 08/18/14 1333 5\' 5"  (1.651 m)     Head Cir --      Peak Flow --      Pain Score --      Pain Loc --      Pain Edu? --      Excl. in [place]? --     Constitutional: Alert and oriented. Well appearing and in no distress. Eyes: Normal exam, 2-3 mm PERRL bilaterally ENT   Head: Normocephalic and atraumatic.   Mouth/Throat: Mucous membranes are moist. Cardiovascular: Normal rate, regular rhythm. No murmurs Respiratory: Normal respiratory effort without tachypnea nor retractions. Breath sounds are clear Gastrointestinal: Soft and nontender. No distention.   Musculoskeletal: Nontender with normal range of motion in all extremities.  Neurologic:  Normal speech and language. No gross focal neurologic deficits. Equal grip strength, 5/5 motor in all extremities. No pronator drift. Normal cerebellar function testing (finger to nose). Skin:  Skin is warm, dry and intact.  Psychiatric: Mood and affect are normal. Speech and behavior are normal.   ____________________________________________    EKG  EKG reviewed and interpreted by myself shows normal sinus rhythm at 82 bpm, narrow QRS, normal axis, normal intervals, no ST changes noted.  ____________________________________________    RADIOLOGY  CT head within normal limits  ____________________________________________   INITIAL IMPRESSION / ASSESSMENT AND PLAN / ED COURSE  Pertinent labs & imaging results that were available during my care of the patient were reviewed by me and considered in my medical decision making  (see chart for details).  Patient with symptoms suggestive of mini stroke/TIA. We will check labs, CT head, and closely monitor in the emergency department. EKG appears to show normal sinus rhythm. I anticipate likely admission given the severity, and recurrence of symptoms.  Labs consistent with a significant urinary tract infection. CT head within normal limits. We will admit for further workup, and treatment of her urinary tract infection to make sure symptoms do not recur. ____________________________________________   FINAL CLINICAL IMPRESSION(S) / ED DIAGNOSES  Urinary tract infection Confusion   Harvest Dark, MD 08/18/14 1702

## 2014-08-18 NOTE — Plan of Care (Signed)
Problem: Discharge Progression Outcomes Goal: Barriers To Progression Addressed/Resolved Individualization Outcome: Progressing Goes by [place]. Lives at home with husband. Hx of HTN, CAD, PVD, MI controlled with home meds. Goal: Other Discharge Outcomes/Goals Outcome: Progressing Plan of care progress to goal for:  UTI: IVF & IV ABX infusing. Afebrile. No c/o pain. Introduced to unit and oriented to room. Tolerating diet. Husband at the bedside.

## 2014-08-18 NOTE — ED Notes (Signed)
Pt c/o dizziness with confusion, blurred vision around 1030am and they called EMS, states her sx improved and decided not to come to the ED, states her sx returned in the past hour, pt is a/ox4 at present, c/o blurred vision.Marland Kitchendenies any pain.Marland Kitchen

## 2014-08-18 NOTE — ED Notes (Signed)
Patient returned from radiology

## 2014-08-18 NOTE — H&P (Signed)
[place] at [place] NAME: [name] [name]    MR#:  [id number]  DATE OF BIRTH:  [birth year]  DATE OF ADMISSION:  08/18/2014  PRIMARY CARE PHYSICIAN: Wilhemena Durie, MD   REQUESTING/REFERRING PHYSICIAN: Dr. Jacqualine Code  CHIEF COMPLAINT:   Chief Complaint  Patient presents with  . Dizziness    HISTORY OF PRESENT ILLNESS: [name] [name]  is a 74 y.o. female with a known history of multiple medical problems including coronary artery disease, peripheral vascular disease, hypertension, hyperlipidemia, diabetes mellitus who presents to the hospital with complaints of confusion as well as feeling dizzy and having blurry vision. Apparently patient was doing well up until today morning when she was noted to be confused by her husband. She was complaining of feeling very dizzy and was having blurring of vision. For this reason she was brought to emergency room for further evaluation. In emergency room , she was afebrile and her vital signs were normal, her labs, however, revealed renal insufficiency as well as pyuria. CT scan of the head was unremarkable. Patient tells me that she feels better and has no significant concerns. She also noted that she had some chest pain early in the morning. Hospitalist services were contacted for admission  PAST MEDICAL HISTORY:   Past Medical History  Diagnosis Date  . MI (myocardial infarction)   . Hypertension   . Hyperlipidemia     PAST SURGICAL HISTORY:  Past Surgical History  Procedure Laterality Date  . Appendectomy    . Abdominal hysterectomy    . Cystoscopy    . Tonsillectomy    . Coronary angioplasty with stent placement      SOCIAL HISTORY:  History  Substance Use Topics  . Smoking status: Former Research scientist (life sciences)  . Smokeless tobacco: Never Used  . Alcohol Use: No    FAMILY HISTORY:  Family History  Problem Relation Age of Onset  . Hyperlipidemia Mother   . Allergies Mother   . Cerebral aneurysm  Mother     cause of death at age 45  . Heart disease Father     Fatal MI ag 65  . Cancer Brother     colon-diagnosed age 62  . Healthy Son   . Hyperlipidemia Brother   . Colonic polyp Brother   . Healthy Son     DRUG ALLERGIES:  Allergies  Allergen Reactions  . Celebrex  [Celecoxib]     Dark stools.  . Penicillins Other (See Comments)    Childhood reaction  . Prevacid  [Lansoprazole] Diarrhea    Review of Systems  Constitutional: Negative for fever, chills, weight loss and malaise/fatigue.  HENT: Positive for congestion.   Eyes: Positive for blurred vision. Negative for double vision.  Respiratory: Negative for cough, sputum production, shortness of breath and wheezing.   Cardiovascular: Positive for chest pain. Negative for palpitations, orthopnea, leg swelling and PND.  Gastrointestinal: Negative for nausea, vomiting, abdominal pain, diarrhea, constipation, blood in stool and melena.  Genitourinary: Negative for dysuria, urgency, frequency and hematuria.  Musculoskeletal: Negative for falls.  Skin: Negative for rash.  Neurological: Positive for dizziness. Negative for weakness.  Psychiatric/Behavioral: Negative for depression and memory loss. The patient is not nervous/anxious.     MEDICATIONS AT HOME:  Prior to Admission medications   Medication Sig Start Date End Date Taking? Authorizing Provider  aspirin 81 MG tablet Take 1 tablet by mouth daily. 09/18/12   Historical Provider, MD  atorvastatin (LIPITOR) 40 MG tablet Take  1 tablet by mouth at bedtime. 04/17/13   Historical Provider, MD  ferrous sulfate 325 (65 FE) MG tablet Take 1 tablet by mouth daily.    Historical Provider, MD  lisinopril (PRINIVIL,ZESTRIL) 10 MG tablet Take 1 tablet by mouth daily.    Historical Provider, MD  meloxicam (MOBIC) 7.5 MG tablet Take 1 tablet by mouth 2 (two) times daily as needed. 09/09/13   Historical Provider, MD  Omeprazole 20 MG TBEC Take 1 capsule by mouth daily. 04/10/13   Historical  Provider, MD      PHYSICAL EXAMINATION:   VITAL SIGNS: Blood pressure 138/64, pulse 84, temperature 98.1 F (36.7 C), temperature source Oral, resp. rate 20, height 5\' 5"  (1.651 m), weight 70.308 kg (155 lb), SpO2 100 %.  GENERAL:  74 y.o.-year-old patient lying in the bed with no acute distress. Sitting comfortably on the bed, however, look at her husband for help when the she is asked questions. Slow to respond EYES: Pupils equal, round, reactive to light and accommodation. No scleral icterus. Extraocular muscles intact.  HEENT: Head atraumatic, normocephalic. Oropharynx and nasopharynx clear.  NECK:  Supple, no jugular venous distention. No thyroid enlargement, no tenderness.  LUNGS: Normal breath sounds bilaterally, no wheezing, rales,rhonchi or crepitation. No use of accessory muscles of respiration.  CARDIOVASCULAR: S1, S2 normal. No murmurs, rubs, or gallops.  ABDOMEN: Soft, nontender, nondistended. Bowel sounds present. No organomegaly or mass.  EXTREMITIES: No pedal edema, cyanosis, or clubbing.  NEUROLOGIC: Cranial nerves II through XII are intact. Muscle strength 5/5 in all extremities. Sensation intact. Gait not checked.  PSYCHIATRIC: The patient is alert and oriented x 3.  SKIN: No obvious rash, lesion, or ulcer.   LABORATORY PANEL:   CBC  Recent Labs Lab 08/18/14 1342  WBC 9.7  HGB 9.7*  HCT 31.3*  PLT 279  MCV 81.0  MCH 25.0*  MCHC 30.9*  RDW 16.7*   ------------------------------------------------------------------------------------------------------------------  Chemistries   Recent Labs Lab 08/18/14 1342  NA 143  K 3.8  CL 111  CO2 24  GLUCOSE 111*  BUN 20  CREATININE 1.35*  CALCIUM 9.0   ------------------------------------------------------------------------------------------------------------------  Cardiac Enzymes  Recent Labs Lab 08/18/14 1342  TROPONINI <0.03    ------------------------------------------------------------------------------------------------------------------  RADIOLOGY: Ct Head Wo Contrast  08/18/2014   CLINICAL DATA:  Dizziness with transient altered mental status. Visual changes earlier today. Initial encounter.  EXAM: CT HEAD WITHOUT CONTRAST  TECHNIQUE: Contiguous axial images were obtained from the base of the skull through the vertex without intravenous contrast.  COMPARISON:  Maxillofacial CTs 01/26/2011 and 07/08/2009.  FINDINGS: There is no evidence of acute intracranial hemorrhage, mass lesion, brain edema or extra-axial fluid collection. The ventricles and subarachnoid spaces are appropriately sized for age. There is no CT evidence of acute cortical infarction. There is patchy low-density within the periventricular white matter and basal ganglia. There are bilateral basal ganglia calcifications. Intracranial vascular calcifications noted.  The visualized paranasal sinuses, mastoid air cells and middle ears are clear. The calvarium is intact.  IMPRESSION: No acute intracranial findings. Mild periventricular white matter disease, likely secondary to chronic small vessel ischemic change.   Electronically Signed   By: Richardean Sale M.D.   On: 08/18/2014 15:45    EKG: Orders placed or performed during the hospital encounter of 08/18/14  . ED EKG  . ED EKG   EKG revealed normal sinus rhythm at 82 bpm, normal axis, no acute ST-T changes  IMPRESSION AND PLAN:  Principal Problem:   UTI (lower urinary  tract infection) Active Problems:   Blurry vision   Dizziness  1. Dizziness, unclear etiology at this time. Start patient on meclizine follow her on telemetry, get orthostatic vital signs 2. UTI. Get urine cultures , start patient on the Rocephin 3. Renal insufficiency. Follow with IV fluid administration 4. Anemia, known history of iron deficiency anemia, continue current medications. Hemoglobin level seemed to be stable since  November 2015 5. Confusion, possibly delirium due to her urinary tract infection, follow clinically   All the records are reviewed and case discussed with ED provider. Management plans discussed with the patient, family and they are in agreement.  CODE STATUS:  Full code  TOTAL TIME TAKING CARE OF THIS PATIENT: 50 minutes.    Theodoro Grist M.D on 08/18/2014 at 5:18 PM  Between 7am to 6pm - Pager - [phone] After 6pm go to [email or web address] - password [email or web address] [facility]  Office  [phone]  CC: Primary care physician; Wilhemena Durie, MD

## 2014-08-19 LAB — BASIC METABOLIC PANEL
ANION GAP: 7 (ref 5–15)
BUN: 17 mg/dL (ref 6–20)
CALCIUM: 8.3 mg/dL — AB (ref 8.9–10.3)
CO2: 23 mmol/L (ref 22–32)
CREATININE: 1.22 mg/dL — AB (ref 0.44–1.00)
Chloride: 112 mmol/L — ABNORMAL HIGH (ref 101–111)
GFR, EST AFRICAN AMERICAN: 49 mL/min — AB (ref >60)
GFR, EST NON AFRICAN AMERICAN: 42 mL/min — AB (ref >60)
Glucose, Bld: 96 mg/dL (ref 65–99)
Potassium: 3.6 mmol/L (ref 3.5–5.1)
SODIUM: 142 mmol/L (ref 135–145)

## 2014-08-19 LAB — CBC
HCT: 28.6 % — ABNORMAL LOW (ref 35.0–47.0)
Hemoglobin: 9 g/dL — ABNORMAL LOW (ref 12.0–16.0)
MCH: 25.5 pg — ABNORMAL LOW (ref 26.0–34.0)
MCHC: 31.6 g/dL — ABNORMAL LOW (ref 32.0–36.0)
MCV: 80.8 fL (ref 80.0–100.0)
Platelets: 241 10*3/uL (ref 150–440)
RBC: 3.54 MIL/uL — ABNORMAL LOW (ref 3.80–5.20)
RDW: 16.8 % — ABNORMAL HIGH (ref 11.5–14.5)
WBC: 6.6 10*3/uL (ref 3.6–11.0)

## 2014-08-19 MED ORDER — ENOXAPARIN SODIUM 40 MG/0.4ML ~~LOC~~ SOLN
40.0000 mg | SUBCUTANEOUS | Status: DC
  Administered 2014-08-19: 40 mg via SUBCUTANEOUS
  Filled 2014-08-19: qty 0.4

## 2014-08-19 NOTE — Progress Notes (Signed)
[place] at [place] NAME: [name] [name]    MR#:  [id number]  DATE OF BIRTH:  10-28-1940  SUBJECTIVE:  Brought in for dizziness and confusion. Much better now. Mentation improved  REVIEW OF SYSTEMS:   Review of Systems  Constitutional: Negative for fever, chills, weight loss and malaise/fatigue.  HENT: Negative for ear discharge, ear pain and nosebleeds.   Eyes: Negative for blurred vision, pain and discharge.  Respiratory: Negative for sputum production, shortness of breath, wheezing and stridor.   Cardiovascular: Negative for chest pain, palpitations, orthopnea and PND.  Gastrointestinal: Negative for nausea, vomiting, abdominal pain and diarrhea.  Genitourinary: Negative for dysuria, urgency and frequency.  Musculoskeletal: Negative for back pain and joint pain.  Neurological: Positive for weakness. Negative for sensory change, speech change and focal weakness.  Psychiatric/Behavioral: Negative for depression. The patient is not nervous/anxious.   All other systems reviewed and are negative.  Tolerating Diet:yes  DRUG ALLERGIES:   Allergies  Allergen Reactions  . Celebrex  [Celecoxib]     Dark stools.  . Penicillins Other (See Comments)    Childhood reaction  . Prevacid  [Lansoprazole] Diarrhea    VITALS:  Blood pressure 133/55, pulse 95, temperature 98.7 F (37.1 C), temperature source Oral, resp. rate 18, height 5\' 5"  (1.651 m), weight 70.308 kg (155 lb), SpO2 100 %.  PHYSICAL EXAMINATION:   Physical Exam  GENERAL:  74 y.o.-year-old patient lying in the bed with no acute distress.  EYES: Pupils equal, round, reactive to light and accommodation. No scleral icterus. Extraocular muscles intact.  HEENT: Head atraumatic, normocephalic. Oropharynx and nasopharynx clear.  NECK:  Supple, no jugular venous distention. No thyroid enlargement, no tenderness.  LUNGS: Normal breath sounds bilaterally, no wheezing, rales,  rhonchi. No use of accessory muscles of respiration.  CARDIOVASCULAR: S1, S2 normal. No murmurs, rubs, or gallops.  ABDOMEN: Soft, nontender, nondistended. Bowel sounds present. No organomegaly or mass.  EXTREMITIES: No cyanosis, clubbing or edema b/l.    NEUROLOGIC: Cranial nerves II through XII are intact. No focal Motor or sensory deficits b/l.   PSYCHIATRIC: The patient is alert and oriented x 3.  SKIN: No obvious rash, lesion, or ulcer.    LABORATORY PANEL:   CBC  Recent Labs Lab 08/19/14 0449  WBC 6.6  HGB 9.0*  HCT 28.6*  PLT 241    Chemistries   Recent Labs Lab 08/19/14 0449  NA 142  K 3.6  CL 112*  CO2 23  GLUCOSE 96  BUN 17  CREATININE 1.22*  CALCIUM 8.3*    Cardiac Enzymes  Recent Labs Lab 08/18/14 1342  TROPONINI <0.03    RADIOLOGY:  Ct Head Wo Contrast  08/18/2014   CLINICAL DATA:  Dizziness with transient altered mental status. Visual changes earlier today. Initial encounter.  EXAM: CT HEAD WITHOUT CONTRAST  TECHNIQUE: Contiguous axial images were obtained from the base of the skull through the vertex without intravenous contrast.  COMPARISON:  Maxillofacial CTs 01/26/2011 and 07/08/2009.  FINDINGS: There is no evidence of acute intracranial hemorrhage, mass lesion, brain edema or extra-axial fluid collection. The ventricles and subarachnoid spaces are appropriately sized for age. There is no CT evidence of acute cortical infarction. There is patchy low-density within the periventricular white matter and basal ganglia. There are bilateral basal ganglia calcifications. Intracranial vascular calcifications noted.  The visualized paranasal sinuses, mastoid air cells and middle ears are clear. The calvarium is intact.  IMPRESSION: No acute intracranial findings. Mild  periventricular white matter disease, likely secondary to chronic small vessel ischemic change.   Electronically Signed   By: Richardean Sale M.D.   On: 08/18/2014 15:45     ASSESSMENT AND  PLAN:   74 y.o. female with a known history of multiple medical problems including coronary artery disease, peripheral vascular disease, hypertension, hyperlipidemia, diabetes mellitus who presents to the hospital with complaints of confusion as well as feeling dizzy and having blurry vision  1. Dizziness suspect mild dehydration -resolved 2. UTI.  on the Rocephin -doing well.  -UC pending No fever. Normal wbc  3. Renal insufficiency. Follow with IV fluid administration -creatinine improved. Pt clinically hydrated -d/c IVF  4. Anemia, known history of iron deficiency anemia, continue current medications. Hemoglobin level seemed to be stable since November 2015  5. Confusion, possibly delirium due to her urinary tract infection -now resolved. Back to baseline  Management plans discussed with the patient, family and they are in agreement.  CODE STATUS: FULL  DVT Prophylaxis: lovenox  TOTAL TIME TAKING CARE OF THIS PATIENT: 35 minutes.  >50% time spent on counselling and coordination of care  POSSIBLE D/C IN 1 DAYS, DEPENDING ON CLINICAL CONDITION.   Ronee Ranganathan M.D on 08/19/2014 at 12:52 PM  Between 7am to 6pm - Pager - [phone]  After 6pm go to [email or web address] - password [email or web address] [facility]  Office  [phone]  CC: Primary care physician; Wilhemena Durie, MD

## 2014-08-19 NOTE — Care Management (Signed)
Admitted to [facility] with the diagnosis of urinary tract infection. Lives with husband, [name] [phone]). Sees Dr. Rosanna Randy. Takes care of all activities of daily living herself.  IV Rocephin continues. WBC's 6.6 temperature = 98.1-99.1   Sleeping. Shelbie Ammons RN MSN Care Management. [phone]

## 2014-08-19 NOTE — Plan of Care (Addendum)
Problem: Discharge Progression Outcomes Goal: Other Discharge Outcomes/Goals Outcome: Progressing Plan of care progress to goals: Discharge plan: discharge back to home when improved Pain: C/o headache, improved with PRN Tylenol.  Hemodynamically stable: creatinine elevated, continue to monitor labs. Afebrile this shift, otherwise VSS. IVF and IV antibx infused per MD order. Complications resolved: pt is alert and oriented X's 4 this shift. Diet: No c/o nausea or vomiting this shift.  Activity: Up to BR with assistance, moderate fall risk +, bed alarm activated. Temperature: afebrile this shift.

## 2014-08-19 NOTE — Plan of Care (Signed)
Problem: Discharge Progression Outcomes Goal: Barriers To Progression Addressed/Resolved Individualization  Outcome: Progressing Goes by [place]. Lives at home with husband. Hx of HTN, CAD, PVD, MI controlled with home meds. Goal: Other Discharge Outcomes/Goals Outcome: Progressing Plan of Care Progress to Goal: No complaints of pain. VSS. Afebrile.Tolerating diet well. Possible discharge tomorrow.

## 2014-08-20 LAB — URINE CULTURE

## 2014-08-20 MED ORDER — CEPHALEXIN 500 MG PO CAPS: 500.0000 mg | ORAL_CAPSULE | Freq: Two times a day (BID) | ORAL | Status: DC

## 2014-08-20 MED ORDER — CEPHALEXIN 500 MG PO CAPS
500.0000 mg | ORAL_CAPSULE | Freq: Two times a day (BID) | ORAL | Status: DC
  Administered 2014-08-20: 09:00:00 500 mg via ORAL
  Filled 2014-08-20: qty 1

## 2014-08-20 MED ORDER — FERROUS SULFATE 325 (65 FE) MG PO TABS: 325.0000 mg | ORAL_TABLET | Freq: Every day | ORAL | Status: DC

## 2014-08-20 NOTE — Discharge Summary (Signed)
[place] at [place] NAME: [name] [name]    MR#:  [id number]  DATE OF BIRTH:  03-08-1941  DATE OF ADMISSION:  08/18/2014 ADMITTING PHYSICIAN: Theodoro Grist, MD  DATE OF DISCHARGE: 08/20/14  PRIMARY CARE PHYSICIAN: Wilhemena Durie, MD    ADMISSION DIAGNOSIS:  Confusion [R41.0] UTI (lower urinary tract infection) [N39.0]  DISCHARGE DIAGNOSIS:  UTI Dehydration-mild  SECONDARY DIAGNOSIS:   Past Medical History  Diagnosis Date  . MI (myocardial infarction)   . Hypertension   . Hyperlipidemia     HOSPITAL COURSE:  74 y.o. female with a known history of multiple medical problems including coronary artery disease, peripheral vascular disease, hypertension, hyperlipidemia, diabetes mellitus who presents to the hospital with complaints of confusion as well as feeling dizzy and having blurry vision  1. Dizziness suspect mild dehydration -resolved 2. UTI. on the Rocephin -doing well.  -UC no growth so far. Change to po keflex No fever. Normal wbc  3. Renal insufficiency. Follow with IV fluid administration -creatinine improved. Pt clinically hydrated -d/c IVF  4. Anemia, known history of iron deficiency anemia, continue current medications. Hemoglobin level seemed to be stable since November 2015  5. Confusion, possibly delirium due to her urinary tract infection -now resolved. Back to baseline Overall improving. D/c home DISCHARGE CONDITIONS:   fair  CONSULTS OBTAINED:   none DRUG ALLERGIES:   Allergies  Allergen Reactions  . Celebrex  [Celecoxib]     Dark stools.  . Penicillins Other (See Comments)    Childhood reaction  . Prevacid  [Lansoprazole] Diarrhea    DISCHARGE MEDICATIONS:   Current Discharge Medication List    START taking these medications   Details  cephALEXin (KEFLEX) 500 MG capsule Take 1 capsule (500 mg total) by mouth 2 (two) times daily. Qty: 8 capsule, Refills: 0      CONTINUE  these medications which have NOT CHANGED   Details  aspirin EC 81 MG tablet Take 81 mg by mouth daily.    atorvastatin (LIPITOR) 40 MG tablet Take 1 tablet by mouth at bedtime.    lisinopril (PRINIVIL,ZESTRIL) 10 MG tablet Take 1 tablet by mouth daily.    meloxicam (MOBIC) 7.5 MG tablet Take 1 tablet by mouth 2 (two) times daily as needed (for bursitis.).     Omeprazole 20 MG TBEC Take 1 capsule by mouth daily as needed (for upset stomach.).       STOP taking these medications     ferrous sulfate 325 (65 FE) MG tablet        If you experience worsening of your admission symptoms, develop shortness of breath, life threatening emergency, suicidal or homicidal thoughts you must seek medical attention immediately by calling 911 or calling your MD immediately  if symptoms less severe.  You Must read complete instructions/literature along with all the possible adverse reactions/side effects for all the Medicines you take and that have been prescribed to you. Take any new Medicines after you have completely understood and accept all the possible adverse reactions/side effects.   Please note  You were cared for by a hospitalist during your hospital stay. If you have any questions about your discharge medications or the care you received while you were in the hospital after you are discharged, you can call the unit and asked to speak with the hospitalist on call if the hospitalist that took care of you is not available. Once you are discharged, your primary care physician will handle  any further medical issues. Please note that NO REFILLS for any discharge medications will be authorized once you are discharged, as it is imperative that you return to your primary care physician (or establish a relationship with a primary care physician if you do not have one) for your aftercare needs so that they can reassess your need for medications and monitor your lab values. Today   SUBJECTIVE  Doing well. No  fever. No complaints   VITAL SIGNS:  Blood pressure 172/71, pulse 74, temperature 98.7 F (37.1 C), temperature source Oral, resp. rate 18, height 5\' 5"  (1.651 m), weight 70.308 kg (155 lb), SpO2 97 %.  I/O:   Intake/Output Summary (Last 24 hours) at 08/20/14 9381 Last data filed at 08/20/14 0500  Gross per 24 hour  Intake    480 ml  Output    600 ml  Net   -120 ml    PHYSICAL EXAMINATION:  GENERAL:  74 y.o.-year-old patient lying in the bed with no acute distress.  EYES: Pupils equal, round, reactive to light and accommodation. No scleral icterus. Extraocular muscles intact.  HEENT: Head atraumatic, normocephalic. Oropharynx and nasopharynx clear.  NECK:  Supple, no jugular venous distention. No thyroid enlargement, no tenderness.  LUNGS: Normal breath sounds bilaterally, no wheezing, rales,rhonchi or crepitation. No use of accessory muscles of respiration.  CARDIOVASCULAR: S1, S2 normal. No murmurs, rubs, or gallops.  ABDOMEN: Soft, non-tender, non-distended. Bowel sounds present. No organomegaly or mass.  EXTREMITIES: No pedal edema, cyanosis, or clubbing.  NEUROLOGIC: Cranial nerves II through XII are intact. Muscle strength 5/5 in all extremities. Sensation intact. Gait not checked.  PSYCHIATRIC: The patient is alert and oriented x 3.  SKIN: No obvious rash, lesion, or ulcer.   DATA REVIEW:  CBC   Recent Labs Lab 08/19/14 0449  WBC 6.6  HGB 9.0*  HCT 28.6*  PLT 241   Chemistries   Recent Labs Lab 08/19/14 0449  NA 142  K 3.6  CL 112*  CO2 23  GLUCOSE 96  BUN 17  CREATININE 1.22*  CALCIUM 8.3*  Microbiology Results   Recent Results (from the past 240 hour(s))  Urine culture     Status: None (Preliminary result)   Collection Time: 08/18/14  4:23 PM  Result Value Ref Range Status   Specimen Description URINE, CLEAN CATCH  Final   Special Requests NONE  Final   Culture NO GROWTH < 24 HOURS  Final   Report Status PENDING  Incomplete    RADIOLOGY:  Ct  Head Wo Contrast  08/18/2014   CLINICAL DATA:  Dizziness with transient altered mental status. Visual changes earlier today. Initial encounter.  EXAM: CT HEAD WITHOUT CONTRAST  TECHNIQUE: Contiguous axial images were obtained from the base of the skull through the vertex without intravenous contrast.  COMPARISON:  Maxillofacial CTs 01/26/2011 and 07/08/2009.  FINDINGS: There is no evidence of acute intracranial hemorrhage, mass lesion, brain edema or extra-axial fluid collection. The ventricles and subarachnoid spaces are appropriately sized for age. There is no CT evidence of acute cortical infarction. There is patchy low-density within the periventricular white matter and basal ganglia. There are bilateral basal ganglia calcifications. Intracranial vascular calcifications noted.  The visualized paranasal sinuses, mastoid air cells and middle ears are clear. The calvarium is intact.  IMPRESSION: No acute intracranial findings. Mild periventricular white matter disease, likely secondary to chronic small vessel ischemic change.   Electronically Signed   By: Richardean Sale M.D.   On: 08/18/2014 15:45  Management plans discussed with the patient, family and they are in agreement.  CODE STATUS:     Code Status Orders        Start     Ordered   08/18/14 1832  Full code   Continuous     08/18/14 1831      TOTAL TIME TAKING CARE OF THIS PATIENT: 40 minutes.    Lauri Till M.D on 08/20/2014 at 8:22 AM  Between 7am to 6pm - Pager - [phone] After 6pm go to [email or web address] - password [email or web address] [facility]  Office  [phone]  CC: Primary care physician; Wilhemena Durie, MD

## 2014-08-20 NOTE — Plan of Care (Signed)
Problem: Discharge Progression Outcomes Goal: Other Discharge Outcomes/Goals Outcome: Progressing Plan of Care Progress to Goal: Pain-c/o headache improved with PO PRN tylenol. Hemodynamically stable- low grade temp (99.5) this shift, otherwise VSS, labs improved. Diet- tolerating; no c/o nausea or vomiting. Activity- up to BR with stand by assist; possible discharge to home today. Temperature-low grade temp at 99.5 noted this shift, resolved with no intervention. Moderate fall risk, offer toileting with each safety round. No falls or injuries this shift.

## 2014-08-20 NOTE — Plan of Care (Signed)
Problem: Discharge Progression Outcomes Goal: Other Discharge Outcomes/Goals Outcome: Adequate for Discharge Patient is A&O Patient VS are stable  Patient is tolerating diet well  Received MD order to discharge patient to home

## 2014-08-28 ENCOUNTER — Ambulatory Visit (INDEPENDENT_AMBULATORY_CARE_PROVIDER_SITE_OTHER): Payer: Commercial Managed Care - HMO | Admitting: Family Medicine

## 2014-08-28 ENCOUNTER — Encounter: Payer: Self-pay | Admitting: Family Medicine

## 2014-08-28 VITALS — BP 150/72 | HR 72 | Temp 97.6°F | Resp 16 | Ht 64.0 in | Wt 157.0 lb

## 2014-08-28 DIAGNOSIS — K219 Gastro-esophageal reflux disease without esophagitis: Secondary | ICD-10-CM | POA: Diagnosis not present

## 2014-08-28 DIAGNOSIS — Z09 Encounter for follow-up examination after completed treatment for conditions other than malignant neoplasm: Secondary | ICD-10-CM | POA: Diagnosis not present

## 2014-08-28 DIAGNOSIS — K122 Cellulitis and abscess of mouth: Secondary | ICD-10-CM

## 2014-08-28 DIAGNOSIS — N3001 Acute cystitis with hematuria: Secondary | ICD-10-CM | POA: Diagnosis not present

## 2014-08-28 DIAGNOSIS — M791 Myalgia, unspecified site: Secondary | ICD-10-CM

## 2014-08-28 DIAGNOSIS — Z0000 Encounter for general adult medical examination without abnormal findings: Secondary | ICD-10-CM

## 2014-08-28 DIAGNOSIS — E7800 Pure hypercholesterolemia, unspecified: Secondary | ICD-10-CM

## 2014-08-28 LAB — POCT URINALYSIS DIPSTICK
Bilirubin, UA: NEGATIVE
Glucose, UA: NEGATIVE
Ketones, UA: NEGATIVE
Nitrite, UA: NEGATIVE
PH UA: 6
PROTEIN UA: 30
SPEC GRAV UA: 1.02
UROBILINOGEN UA: 0.2

## 2014-08-28 MED ORDER — OMEPRAZOLE 20 MG PO TBEC: 20.0000 mg | DELAYED_RELEASE_TABLET | Freq: Every day | ORAL | Status: DC | PRN

## 2014-08-28 MED ORDER — ATORVASTATIN CALCIUM 40 MG PO TABS: 40.0000 mg | ORAL_TABLET | Freq: Every day | ORAL | Status: DC

## 2014-08-28 MED ORDER — MELOXICAM 7.5 MG PO TABS: 7.5000 mg | ORAL_TABLET | Freq: Two times a day (BID) | ORAL | Status: DC | PRN

## 2014-08-28 NOTE — Progress Notes (Signed)
Patient ID: ADASHA BOEHME, female   DOB: May 10, 1940, 74 y.o.   MRN: [id number] Patient: [name] [name], Female    DOB: [birth year], 74 y.o.   MRN: [id number] Visit Date: 08/28/2014  Today's Provider: Wilhemena Durie, MD   Chief Complaint  Patient presents with  . Medicare Wellness   Subjective:    Annual wellness visit [name] [name] is a 74 y.o. female who presents today for her Subsequent Annual Wellness Visit. She feels well. She reports not exercising. She reports she is sleeping well. Last CPE: 07/22/11 07/22/11 BMD-osteopenia 08/10/12 Colonoscopy-diverticulosis; recheck in 5 years 04/02/2014 Mammogram- WNL  Follow up Hospitalization  Patient was admitted to [facility] on 08/20/2014 and discharged on 08/22/14. She was treated for infection oral infection. Treatment for this included Clindamycin. She reports excellent compliance with treatment. She reports this condition is Improved.   --------------------------------------------------------   Review of Systems  Constitutional: Negative.   HENT: Positive for dental problem and trouble swallowing.   Eyes: Negative.   Respiratory: Negative.   Cardiovascular: Negative.   Gastrointestinal: Negative.   Endocrine: Negative.   Genitourinary: Positive for dysuria.  Musculoskeletal: Positive for arthralgias.  Skin: Negative.   Allergic/Immunologic: Negative.   Neurological: Negative.   Hematological: Negative.   Psychiatric/Behavioral: Negative.     History   Social History  . Marital Status: Married    Spouse Name: [name]  . Number of Children: 2  . Years of Education: N/A   Occupational History  . retired    Social History Main Topics  . Smoking status: Former Smoker -- 0.25 packs/day for 10 years    Types: Cigarettes    Quit date: 03/06/1998  . Smokeless tobacco: Never Used  . Alcohol Use: No  . Drug Use: No  . Sexual Activity: Yes    Birth Control/ Protection: Surgical   Other Topics Concern  . Not on  file   Social History Narrative    Patient Active Problem List   Diagnosis Date Noted  . UTI (lower urinary tract infection) 08/18/2014  . Blurry vision 08/18/2014  . Allergic rhinitis 07/12/2014  . Absolute anemia 07/12/2014  . Baker's cyst of knee 07/12/2014  . Atherosclerosis of coronary artery 07/12/2014  . CAFL (chronic airflow limitation) 07/12/2014  . Dizziness 07/12/2014  . Essential (primary) hypertension 07/12/2014  . Acid reflux 07/12/2014  . 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 History of colon polyps 07/12/2014  . Hypercholesteremia 07/12/2014  . Malaise and fatigue 07/12/2014  . Heart attack 07/12/2014  . Muscle ache 07/12/2014  . Arthritis, degenerative 07/12/2014  . Peripheral blood vessel disorder 07/12/2014  . Breath shortness 07/12/2014  . Bradycardia 08/23/2013  . Arteriosclerosis of coronary artery 08/17/2013  . Diabetes 08/17/2013    Past Surgical History  Procedure Laterality Date  . Appendectomy    . Abdominal hysterectomy    . Cystoscopy    . Tonsillectomy    . Coronary angioplasty with stent placement      Her family history includes Allergies in her mother; Cancer in her brother and brother; Cerebral aneurysm in her mother; Colonic polyp in her brother; Healthy in her son and son; Heart disease in her father; Hyperlipidemia in her brother and mother.    Previous Medications   ASPIRIN EC 81 MG TABLET    Take 81 mg by mouth daily.   ATORVASTATIN (LIPITOR) 40 MG TABLET    Take 1 tablet by mouth at bedtime.   CLINDAMYCIN (CLEOCIN) 300 MG CAPSULE    TAKE 1  CAPSULE BY MOUTH EVERY 6 HOURS   EAR WAX REMOVAL DROPS 6.5 % OTIC SOLUTION       FERROUS SULFATE 325 (65 FE) MG TABLET    Take 1 tablet (325 mg total) by mouth daily.   IBUPROFEN (ADVIL,MOTRIN) 600 MG TABLET    TAKE 1 TABLET BY MOUTH EVERY 6 HOURS AS NEEDED PAIN   LISINOPRIL (PRINIVIL,ZESTRIL) 10 MG TABLET    Take 1 tablet by mouth daily.   MELOXICAM (MOBIC) 7.5 MG TABLET    Take 1 tablet by  mouth 2 (two) times daily as needed (for bursitis.).    OMEPRAZOLE 20 MG TBEC    Take 1 capsule by mouth daily as needed (for upset stomach.).     Patient Care Team: Jerrol Banana., MD as PCP - General (Family Medicine)     Objective:   Vitals: BP 150/72 mmHg  Pulse 72  Temp(Src) 97.6 F (36.4 C) (Oral)  Resp 16  Ht 5\' 4"  (1.626 m)  Wt 157 lb (71.215 kg)  BMI 26.94 kg/m2  SpO2 98%  Physical Exam  Constitutional: She is oriented to person, place, and time. She appears well-developed and well-nourished.  HENT:  Head: Normocephalic and atraumatic.  Right Ear: Tympanic membrane, external ear and ear canal normal.  Left Ear: Tympanic membrane, external ear and ear canal normal.  Nose: Nose normal.  Mouth/Throat: Uvula is midline, oropharynx is clear and moist and mucous membranes are normal. Oral lesions present.  Eyes: Conjunctivae, EOM and lids are normal. Pupils are equal, round, and reactive to light.  Neck: Trachea normal and normal range of motion. Neck supple. Carotid bruit is not present. No thyroid mass and no thyromegaly present.  Cardiovascular: Normal rate, regular rhythm and normal heart sounds.   Pulmonary/Chest: Effort normal and breath sounds normal.  Abdominal: Soft. Normal appearance and bowel sounds are normal. There is no hepatosplenomegaly. There is no tenderness.  Genitourinary: No breast swelling, tenderness or discharge.  Musculoskeletal: Normal range of motion.  Lymphadenopathy:    She has no cervical adenopathy.    She has no axillary adenopathy.  Neurological: She is alert and oriented to person, place, and time. She has normal strength. No cranial nerve deficit.  Skin: Skin is warm, dry and intact.  Psychiatric: She has a normal mood and affect. Her speech is normal and behavior is normal. Judgment and thought content normal. Cognition and memory are normal.    Activities of Daily Living In your present state of health, do you have any  difficulty performing the following activities: 08/28/2014 08/18/2014  Hearing? N N  Vision? N N  Difficulty concentrating or making decisions? N N  Walking or climbing stairs? N N  Dressing or bathing? N N  Doing errands, shopping? N N    Fall Risk Assessment Fall Risk  08/28/2014  Falls in the past year? No     Depression Screen PHQ 2/9 Scores 08/28/2014  PHQ - 2 Score 0    Cognitive Testing - 6-CIT  Correct? Score   What year is it? yes 0 0 or 4  What month is it? yes 0 0 or 3  Memorize:    Pia Mau,  40,  [place],      What time is it? (within 1 hour) yes 0 0 or 3  Count backwards from 20 yes 0 0, 2, or 4  Name the months of the year yes 0 0, 2, or 4  Repeat name &  address above yes 0 0, 2, 4, 6, 8, or 10       TOTAL SCORE  0/28   Interpretation:  Normal  Normal (0-7) Abnormal (8-28)       Assessment & Plan:     Annual Wellness Visit  Reviewed patient's Family Medical History Reviewed and updated list of patient's medical providers Assessment of cognitive impairment was done Assessed patient's functional ability Established a written schedule for health screening [place] Completed and Reviewed  Exercise Activities and Dietary recommendations Goals    . Exercise 150 minutes per week (moderate activity)       Immunization History  Administered Date(s) Administered  . Pneumococcal Conjugate-13 02/06/2014  . Pneumococcal Polysaccharide-23 07/04/2012  . Tdap 07/04/2012  . Zoster 07/04/2012    Health Maintenance  Topic Date Due  . FOOT EXAM  11/17/1948  . OPHTHALMOLOGY EXAM  11/17/1948  . URINE MICROALBUMIN  11/17/1948  . DEXA SCAN  11/18/2003  . HEMOGLOBIN A1C  09/19/2012  . INFLUENZA VACCINE  10/07/2014  . TETANUS/TDAP  07/05/2022  . COLONOSCOPY  08/11/2022  . ZOSTAVAX  Completed  . PNA vac Low Risk Adult  Completed     1. Medicare annual wellness visit, subsequent Stable. Patient advised to continue  working on healthy lifestyle.  2. Oral infection New problem. Patient advised to continue Clindamycin and keep F/U with Dentist.  [facility] discharge follow-up Stable. Patient advised to continue current medications.  4. Acute cystitis with hematuria New problem. F/U pending lab result. - POCT urinalysis dipstick - Urine culture  5. Gastroesophageal reflux disease, esophagitis presence not specified Stable. Continue current medication. - Omeprazole 20 MG TBEC; Take 1 tablet (20 mg total) by mouth daily as needed (for upset stomach.).  Dispense: 30 each; Refill: 6  6. Hypercholesteremia Stable. Continue current medication. - atorvastatin (LIPITOR) 40 MG tablet; Take 1 tablet (40 mg total) by mouth at bedtime.  Dispense: 30 tablet; Refill: 6  7. Muscle ache Stable. Continue current medication. - meloxicam (MOBIC) 7.5 MG tablet; Take 1 tablet (7.5 mg total) by mouth 2 (two) times daily as needed (for bursitis.).  Dispense: 60 tablet; Refill: 6    Patient seen and examined by Dr. Miguel Aschoff, and note scribed by Philbert Riser. Dimas, CMA.  ------------------------------------------------------------------------------------------------------------

## 2014-08-29 LAB — URINE CULTURE

## 2014-08-30 ENCOUNTER — Telehealth: Payer: Self-pay

## 2014-08-30 NOTE — Telephone Encounter (Signed)
-----   Message from Jerrol Banana., MD sent at 08/30/2014  8:03 AM EDT ----- No UTI

## 2014-08-30 NOTE — Telephone Encounter (Signed)
Pt advised-aa 

## 2014-09-02 ENCOUNTER — Other Ambulatory Visit: Payer: Self-pay

## 2014-09-02 DIAGNOSIS — K219 Gastro-esophageal reflux disease without esophagitis: Secondary | ICD-10-CM

## 2014-09-02 MED ORDER — OMEPRAZOLE 20 MG PO TBEC: 20.0000 mg | DELAYED_RELEASE_TABLET | Freq: Every day | ORAL | Status: DC | PRN

## 2014-09-03 ENCOUNTER — Telehealth: Payer: Self-pay | Admitting: Family Medicine

## 2014-09-03 NOTE — Telephone Encounter (Signed)
Timedia from [facility] called wanting to get ok to use omaprozole 20mg .  Their call back is [phone]. tp

## 2014-09-03 NOTE — Telephone Encounter (Signed)
This was filled yesterday-aa

## 2014-09-04 ENCOUNTER — Other Ambulatory Visit: Payer: Self-pay

## 2014-09-12 ENCOUNTER — Inpatient Hospital Stay: Payer: Self-pay | Admitting: Family Medicine

## 2014-09-12 ENCOUNTER — Encounter: Payer: Self-pay | Admitting: Family Medicine

## 2014-09-12 ENCOUNTER — Ambulatory Visit (INDEPENDENT_AMBULATORY_CARE_PROVIDER_SITE_OTHER): Payer: Commercial Managed Care - HMO | Admitting: Family Medicine

## 2014-09-12 ENCOUNTER — Other Ambulatory Visit: Payer: Self-pay | Admitting: Family Medicine

## 2014-09-12 VITALS — BP 136/72 | HR 76 | Temp 98.5°F | Resp 14 | Wt 158.0 lb

## 2014-09-12 DIAGNOSIS — Z09 Encounter for follow-up examination after completed treatment for conditions other than malignant neoplasm: Secondary | ICD-10-CM

## 2014-09-12 DIAGNOSIS — N390 Urinary tract infection, site not specified: Secondary | ICD-10-CM | POA: Diagnosis not present

## 2014-09-12 DIAGNOSIS — E860 Dehydration: Secondary | ICD-10-CM | POA: Diagnosis not present

## 2014-09-12 DIAGNOSIS — R319 Hematuria, unspecified: Secondary | ICD-10-CM

## 2014-09-12 DIAGNOSIS — N289 Disorder of kidney and ureter, unspecified: Secondary | ICD-10-CM

## 2014-09-12 NOTE — Progress Notes (Signed)
Patient ID: [name] [name], female   DOB: 1940/04/28, 74 y.o.   MRN: [id number]    Subjective:  HPI  Hospital Follow up  Patient was taking to [facility] on June 12th by her husband due to severe confusion patient was experiencing. She was at [facility] 12-14th. At discharge the following was noted:  1. UTI- she was on Rocephin and then was changed to Keflex-but patient states before she left the hospital the doctor said culture was negative and it was not UTI but told patient she had some kind of infection and states she has infection top teeth and she was put on antibiotic since been at the hospital and is going to have her crowns taking out. She does state she has burning sensation with urination but no other symptoms. She drinks a lot fluids daily.  2. Dehydration/dizziness- resolved.  3. Renal insufficiency- had IV fluids at [facility]-but creatinine improved before leaving the hospital.     Prior to Admission medications   Medication Sig Start Date End Date Taking? Authorizing Provider  aspirin EC 81 MG tablet Take 81 mg by mouth daily.    Historical Provider, MD  atorvastatin (LIPITOR) 40 MG tablet Take 1 tablet (40 mg total) by mouth at bedtime. 08/28/14   Debbora Ang Maceo Pro., MD  clindamycin (CLEOCIN) 300 MG capsule TAKE 1 CAPSULE BY MOUTH EVERY 6 HOURS 08/23/14   Historical Provider, MD  EAR WAX REMOVAL DROPS 6.5 % otic solution  08/08/14   Historical Provider, MD  ferrous sulfate 325 (65 FE) MG tablet Take 1 tablet (325 mg total) by mouth daily. 08/20/14   Fritzi Mandes, MD  ibuprofen (ADVIL,MOTRIN) 600 MG tablet TAKE 1 TABLET BY MOUTH EVERY 6 HOURS AS NEEDED PAIN 08/23/14   Historical Provider, MD  lisinopril (PRINIVIL,ZESTRIL) 10 MG tablet Take 1 tablet by mouth daily.    Historical Provider, MD  meloxicam (MOBIC) 7.5 MG tablet Take 1 tablet (7.5 mg total) by mouth 2 (two) times daily as needed (for bursitis.). 08/28/14   Jerrol Banana., MD  Omeprazole 20 MG TBEC Take 1 tablet (20 mg  total) by mouth daily as needed (for upset stomach.). 09/02/14   Jerrol Banana., MD    Patient Active Problem List   Diagnosis Date Noted  . UTI (lower urinary tract infection) 08/18/2014  . Blurry vision 08/18/2014  . Allergic rhinitis 07/12/2014  . Absolute anemia 07/12/2014  . Baker's cyst of knee 07/12/2014  . Atherosclerosis of coronary artery 07/12/2014  . CAFL (chronic airflow limitation) 07/12/2014  . Dizziness 07/12/2014  . Essential (primary) hypertension 07/12/2014  . Acid reflux 07/12/2014  . 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 History of colon polyps 07/12/2014  . Hypercholesteremia 07/12/2014  . Malaise and fatigue 07/12/2014  . Heart attack 07/12/2014  . Muscle ache 07/12/2014  . Arthritis, degenerative 07/12/2014  . Peripheral blood vessel disorder 07/12/2014  . Breath shortness 07/12/2014  . Bradycardia 08/23/2013  . Arteriosclerosis of coronary artery 08/17/2013  . Diabetes 08/17/2013    Past Medical History  Diagnosis Date  . MI (myocardial infarction)   . Hypertension   . Hyperlipidemia   . GERD (gastroesophageal reflux disease)   . Diabetes mellitus without complication     History   Social History  . Marital Status: Married    Spouse Name: [name]  . Number of Children: 2  . Years of Education: N/A   Occupational History  . retired    Social History Main Topics  .  Smoking status: Former Smoker -- 0.25 packs/day for 10 years    Types: Cigarettes    Quit date: 03/06/1998  . Smokeless tobacco: Never Used  . Alcohol Use: No  . Drug Use: No  . Sexual Activity: Yes    Birth Control/ Protection: Surgical   Other Topics Concern  . Not on file   Social History Narrative    Allergies  Allergen Reactions  . Celebrex  [Celecoxib]     Dark stools.  . Penicillins Other (See Comments)    Childhood reaction  . Prevacid  [Lansoprazole] Diarrhea    Review of Systems  Constitutional: Negative for fever, chills, weight loss and  diaphoresis.  Respiratory: Negative for cough, hemoptysis, sputum production and shortness of breath.   Cardiovascular: Negative for chest pain, palpitations, orthopnea, claudication and leg swelling.  Gastrointestinal: Negative for heartburn, nausea, abdominal pain, diarrhea, constipation and blood in stool.  Musculoskeletal: Negative for myalgias, back pain, joint pain and neck pain.  Neurological: Negative.  Negative for weakness.  Endo/Heme/Allergies: Negative.   Psychiatric/Behavioral: Negative.     Immunization History  Administered Date(s) Administered  . Pneumococcal Conjugate-13 02/06/2014  . Pneumococcal Polysaccharide-23 07/04/2012  . Tdap 07/04/2012  . Zoster 07/04/2012   Objective:  BP 136/72 mmHg  Pulse 76  Temp(Src) 98.5 F (36.9 C)  Resp 14  Wt 158 lb (71.668 kg)  Physical Exam  Constitutional: She is oriented to person, place, and time and well-developed, well-nourished, and in no distress.  HENT:  Head: Normocephalic and atraumatic.  Right Ear: External ear normal.  Left Ear: External ear normal.  Nose: Nose normal.  Eyes: Conjunctivae are normal. Pupils are equal, round, and reactive to light.  Neck: Normal range of motion. Neck supple.  Cardiovascular: Normal rate, regular rhythm, normal heart sounds and intact distal pulses.  Exam reveals no friction rub.   Pulmonary/Chest: Effort normal and breath sounds normal. She has no wheezes. She has no rales. She exhibits no tenderness.  Abdominal: Soft.  Musculoskeletal: Normal range of motion. She exhibits no edema or tenderness.  Neurological: She is alert and oriented to person, place, and time. Gait normal.  Skin: Skin is warm and dry.  Psychiatric: Mood, memory, affect and judgment normal.    Lab Results  Component Value Date   WBC 6.6 08/19/2014   HGB 9.0* 08/19/2014   HCT 28.6* 08/19/2014   PLT 241 08/19/2014   GLUCOSE 96 08/19/2014   CHOL 118 04/11/2013   TRIG 91 04/11/2013   HDL 37 04/11/2013     LDLCALC 63 04/11/2013   TSH 1.59 05/22/2014    CMP     Component Value Date/Time   NA 142 08/19/2014 0449   NA 142 05/22/2014   NA 144 12/31/2013 0056   K 3.6 08/19/2014 0449   K 4.1 12/31/2013 0056   CL 112* 08/19/2014 0449   CL 108* 12/31/2013 0056   CO2 23 08/19/2014 0449   CO2 30 12/31/2013 0056   GLUCOSE 96 08/19/2014 0449   GLUCOSE 101* 12/31/2013 0056   BUN 17 08/19/2014 0449   BUN 26* 05/22/2014   BUN 18 12/31/2013 0056   CREATININE 1.22* 08/19/2014 0449   CREATININE 1.2* 05/22/2014   CREATININE 1.27 01/17/2014 1520   CALCIUM 8.3* 08/19/2014 0449   CALCIUM 8.1* 12/31/2013 0056   PROT 8.1 03/21/2012 1953   ALBUMIN 4.1 03/21/2012 1953   AST 16 07/11/2013   AST 19 03/21/2012 1953   ALT 14 07/11/2013   ALT 18 03/21/2012 1953  ALKPHOS 67 07/11/2013   ALKPHOS 74 03/21/2012 1953   GFRNONAA 42* 08/19/2014 0449   GFRNONAA 59* 03/25/2012 0611   GFRAA 49* 08/19/2014 0449   GFRAA >60 03/25/2012 7412    Assessment and Plan :  1. Hospital discharge follow-up Records reviewed.  2. Urinary tract infection with hematuria, site unspecified UA in the office is positive for RBC and WBC-will send for culture to recheck. Pending result.  3. Dehydration Re check labs today.  4. Renal insufficiency Re check labs today. CBC and Renal.   5. Chronic anemia  Follow-up CBC.  She was had complete GI workup. GI follow-up.    Patient was seen and examined by Dr. Eulas Post and note was scribed by Theressa Millard, RMA.   Miguel Aschoff MD [facility] 09/12/2014 10:34 AM

## 2014-09-13 LAB — CBC WITH DIFFERENTIAL/PLATELET
Basophils Absolute: 0.1 10*3/uL (ref 0.0–0.2)
Basos: 1 %
EOS (ABSOLUTE): 0.4 10*3/uL (ref 0.0–0.4)
EOS: 6 %
Hematocrit: 32.6 % — ABNORMAL LOW (ref 34.0–46.6)
Hemoglobin: 9.6 g/dL — ABNORMAL LOW (ref 11.1–15.9)
IMMATURE GRANULOCYTES: 0 %
Immature Grans (Abs): 0 10*3/uL (ref 0.0–0.1)
LYMPHS ABS: 1.5 10*3/uL (ref 0.7–3.1)
Lymphs: 22 %
MCH: 24.6 pg — ABNORMAL LOW (ref 26.6–33.0)
MCHC: 29.4 g/dL — ABNORMAL LOW (ref 31.5–35.7)
MCV: 83 fL (ref 79–97)
MONOCYTES: 7 %
Monocytes Absolute: 0.5 10*3/uL (ref 0.1–0.9)
NEUTROS ABS: 4.4 10*3/uL (ref 1.4–7.0)
Neutrophils: 64 %
Platelets: 276 10*3/uL (ref 150–379)
RBC: 3.91 x10E6/uL (ref 3.77–5.28)
RDW: 17.6 % — ABNORMAL HIGH (ref 12.3–15.4)
WBC: 6.8 10*3/uL (ref 3.4–10.8)

## 2014-09-13 LAB — RENAL FUNCTION PANEL
ALBUMIN: 3.7 g/dL (ref 3.5–4.8)
BUN/Creatinine Ratio: 21 (ref 11–26)
BUN: 19 mg/dL (ref 8–27)
CALCIUM: 9.4 mg/dL (ref 8.7–10.3)
CHLORIDE: 104 mmol/L (ref 97–108)
CO2: 24 mmol/L (ref 18–29)
Creatinine, Ser: 0.92 mg/dL (ref 0.57–1.00)
GFR calc Af Amer: 70 mL/min/{1.73_m2} (ref >59)
GFR calc non Af Amer: 61 mL/min/{1.73_m2} (ref >59)
Glucose: 89 mg/dL (ref 65–99)
POTASSIUM: 4.6 mmol/L (ref 3.5–5.2)
Phosphorus: 3.9 mg/dL (ref 2.5–4.5)
SODIUM: 142 mmol/L (ref 134–144)

## 2014-09-13 NOTE — Progress Notes (Signed)
Patient advised, has appointment on 10/04/14.  Told her we would check it at that visit.  She is going to continue with her Iron supplement until told to discontinue.  ED

## 2014-09-14 LAB — URINE CULTURE

## 2014-09-18 MED ORDER — NITROFURANTOIN MACROCRYSTAL 100 MG PO CAPS: 100.0000 mg | ORAL_CAPSULE | Freq: Two times a day (BID) | ORAL | Status: DC

## 2014-09-18 NOTE — Addendum Note (Signed)
Addended by: Luvenia Heller on: 09/18/2014 12:51 PM   Modules accepted: Orders

## 2014-11-04 ENCOUNTER — Ambulatory Visit (INDEPENDENT_AMBULATORY_CARE_PROVIDER_SITE_OTHER): Payer: Commercial Managed Care - HMO | Admitting: Family Medicine

## 2014-11-04 ENCOUNTER — Encounter: Payer: Self-pay | Admitting: Family Medicine

## 2014-11-04 VITALS — BP 142/64 | HR 80 | Temp 98.8°F | Resp 12 | Wt 159.0 lb

## 2014-11-04 DIAGNOSIS — L989 Disorder of the skin and subcutaneous tissue, unspecified: Secondary | ICD-10-CM

## 2014-11-04 DIAGNOSIS — N390 Urinary tract infection, site not specified: Secondary | ICD-10-CM

## 2014-11-04 DIAGNOSIS — D6489 Other specified anemias: Secondary | ICD-10-CM

## 2014-11-04 DIAGNOSIS — R319 Hematuria, unspecified: Secondary | ICD-10-CM | POA: Diagnosis not present

## 2014-11-04 LAB — POCT URINALYSIS DIPSTICK
Bilirubin, UA: NEGATIVE
Glucose, UA: NEGATIVE
Ketones, UA: NEGATIVE
Leukocytes, UA: NEGATIVE
NITRITE UA: NEGATIVE
PH UA: 6
Spec Grav, UA: 1.025
Urobilinogen, UA: 0.2

## 2014-11-04 NOTE — Progress Notes (Signed)
Patient ID: [name] [name], female   DOB: [birth year], 74 y.o.   MRN: [id number]    Subjective:  HPI  Anemia follow up: Patient is here for 2 months follow up. CB was checked on July 7th and that was stable. Need to repeat today per last note. Patient is having fatigue issues still.  Skin spots: Noticed more moles and different spots on her skin and she wanted to get his looked at today.  Burning with urination: Patient states she has had this issues for several months. NO other symptoms.  Prior to Admission medications   Medication Sig Start Date End Date Taking? Authorizing Provider  aspirin EC 81 MG tablet Take 81 mg by mouth daily.   Yes Historical Provider, MD  atorvastatin (LIPITOR) 40 MG tablet Take 1 tablet (40 mg total) by mouth at bedtime. 08/28/14  Yes Chenay Nesmith Maceo Pro., MD  clindamycin (CLEOCIN) 300 MG capsule TAKE 1 CAPSULE BY MOUTH EVERY 6 HOURS 08/23/14  Yes Historical Provider, MD  EAR WAX REMOVAL DROPS 6.5 % otic solution  08/08/14  Yes Historical Provider, MD  ferrous sulfate 325 (65 FE) MG tablet Take 1 tablet (325 mg total) by mouth daily. 08/20/14  Yes Fritzi Mandes, MD  ibuprofen (ADVIL,MOTRIN) 600 MG tablet TAKE 1 TABLET BY MOUTH EVERY 6 HOURS AS NEEDED PAIN 08/23/14  Yes Historical Provider, MD  lisinopril (PRINIVIL,ZESTRIL) 10 MG tablet Take 1 tablet by mouth daily.   Yes Historical Provider, MD  meloxicam (MOBIC) 7.5 MG tablet Take 1 tablet (7.5 mg total) by mouth 2 (two) times daily as needed (for bursitis.). 08/28/14  Yes Maila Dukes Maceo Pro., MD  nitrofurantoin (MACRODANTIN) 100 MG capsule Take 1 capsule (100 mg total) by mouth 2 (two) times daily. 09/18/14  Yes Yulissa Needham Maceo Pro., MD  Omeprazole 20 MG TBEC Take 1 tablet (20 mg total) by mouth daily as needed (for upset stomach.). 09/02/14  Yes Jerrol Banana., MD    Patient Active Problem List   Diagnosis Date Noted  . UTI (lower urinary tract infection) 08/18/2014  . Blurry vision 08/18/2014  .  Allergic rhinitis 07/12/2014  . Absolute anemia 07/12/2014  . Baker's cyst of knee 07/12/2014  . Atherosclerosis of coronary artery 07/12/2014  . CAFL (chronic airflow limitation) 07/12/2014  . Dizziness 07/12/2014  . Essential (primary) hypertension 07/12/2014  . Acid reflux 07/12/2014  . 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 History of colon polyps 07/12/2014  . Hypercholesteremia 07/12/2014  . Malaise and fatigue 07/12/2014  . Heart attack 07/12/2014  . Muscle ache 07/12/2014  . Arthritis, degenerative 07/12/2014  . Peripheral blood vessel disorder 07/12/2014  . Breath shortness 07/12/2014  . Bradycardia 08/23/2013  . Arteriosclerosis of coronary artery 08/17/2013  . Diabetes 08/17/2013    Past Medical History  Diagnosis Date  . MI (myocardial infarction)   . Hypertension   . Hyperlipidemia   . GERD (gastroesophageal reflux disease)   . Diabetes mellitus without complication     Social History   Social History  . Marital Status: Married    Spouse Name: [name]  . Number of Children: 2  . Years of Education: N/A   Occupational History  . retired    Social History Main Topics  . Smoking status: Former Smoker -- 0.25 packs/day for 10 years    Types: Cigarettes    Quit date: 03/06/1998  . Smokeless tobacco: Never Used  . Alcohol Use: No  . Drug Use: No  . Sexual Activity: Yes  Birth Control/ Protection: Surgical   Other Topics Concern  . Not on file   Social History Narrative    Allergies  Allergen Reactions  . Celebrex  [Celecoxib]     Dark stools.  . Penicillins Other (See Comments)    Childhood reaction  . Prevacid  [Lansoprazole] Diarrhea    Review of Systems  Constitutional: Positive for malaise/fatigue. Negative for fever and chills.  Respiratory: Negative for cough, sputum production, shortness of breath and wheezing.   Cardiovascular: Negative for chest pain, palpitations and leg swelling.  Gastrointestinal: Negative for heartburn, nausea,  vomiting and abdominal pain.  Genitourinary: Negative for dysuria, urgency, frequency and hematuria.  Musculoskeletal: Negative for myalgias, back pain, joint pain and neck pain.  Neurological: Negative.  Negative for headaches.  Endo/Heme/Allergies: Negative.   Psychiatric/Behavioral: Negative.     Immunization History  Administered Date(s) Administered  . Pneumococcal Conjugate-13 02/06/2014  . Pneumococcal Polysaccharide-23 07/04/2012  . Tdap 07/04/2012  . Zoster 07/04/2012   Objective:  BP 142/64 mmHg  Pulse 80  Temp(Src) 98.8 F (37.1 C)  Resp 12  Wt 159 lb (72.122 kg)  Physical Exam  Constitutional: She is oriented to person, place, and time and well-developed, well-nourished, and in no distress.  HENT:  Head: Normocephalic and atraumatic.  Right Ear: External ear normal.  Left Ear: External ear normal.  Nose: Nose normal.  Eyes: Conjunctivae are normal.  Cardiovascular: Normal rate, regular rhythm and normal heart sounds.   Pulmonary/Chest: Effort normal and breath sounds normal.  Abdominal: Soft.  Neurological: She is alert and oriented to person, place, and time.  Skin: Skin is warm and dry.  Psychiatric: Mood, memory, affect and judgment normal.   skin issues seem to be seborrheic keratosis.  Lab Results  Component Value Date   WBC 6.8 09/12/2014   HGB 9.0* 08/19/2014   HCT 32.6* 09/12/2014   PLT 241 08/19/2014   GLUCOSE 89 09/12/2014   CHOL 118 04/11/2013   TRIG 91 04/11/2013   HDL 37 04/11/2013   LDLCALC 63 04/11/2013   TSH 1.59 05/22/2014   HGBA1C 5.5 03/22/2012    CMP     Component Value Date/Time   NA 142 09/12/2014 1119   NA 142 08/19/2014 0449   NA 144 12/31/2013 0056   K 4.6 09/12/2014 1119   K 4.1 12/31/2013 0056   CL 104 09/12/2014 1119   CL 108* 12/31/2013 0056   CO2 24 09/12/2014 1119   CO2 30 12/31/2013 0056   GLUCOSE 89 09/12/2014 1119   GLUCOSE 96 08/19/2014 0449   GLUCOSE 101* 12/31/2013 0056   BUN 19 09/12/2014 1119    BUN 17 08/19/2014 0449   BUN 18 12/31/2013 0056   CREATININE 0.92 09/12/2014 1119   CREATININE 1.2* 05/22/2014   CREATININE 1.27 01/17/2014 1520   CALCIUM 9.4 09/12/2014 1119   CALCIUM 8.1* 12/31/2013 0056   PROT 8.1 03/21/2012 1953   ALBUMIN 4.1 03/21/2012 1953   AST 16 07/11/2013   AST 19 03/21/2012 1953   ALT 14 07/11/2013   ALT 18 03/21/2012 1953   ALKPHOS 67 07/11/2013   ALKPHOS 74 03/21/2012 1953   BILITOT 0.4 03/21/2012 1953   GFRNONAA 61 09/12/2014 1119   GFRNONAA 44* 01/17/2014 1520   GFRNONAA 59* 03/25/2012 0611   GFRAA 70 09/12/2014 1119   GFRAA 53* 01/17/2014 1520   GFRAA >60 03/25/2012 0611    Assessment and Plan :  1. Anemia due to other cause Has had complete GI workup and has seen  hematology. May need referral back to hematology if this worsens. - CBC with Differential/Platelet  2. Urinary tract infection with hematuria, site unspecified  - POCT UA - Microscopic Only - POCT urinalysis dipstick - Urine Culture - Ambulatory referral to Urology  3. Skin lesion Probable seborrheic keratosis. Due to the atypical appearance refer to Dermatology. - Ambulatory referral to Dermatology  I have done the exam and reviewed the above chart and it is accurate to the best of my knowledge.  Miguel Aschoff MD [facility] 11/04/2014 10:37 AM

## 2014-11-05 LAB — CBC WITH DIFFERENTIAL/PLATELET
Basophils Absolute: 0.1 10*3/uL (ref 0.0–0.2)
Basos: 1 %
EOS (ABSOLUTE): 0.3 10*3/uL (ref 0.0–0.4)
Eos: 4 %
Hematocrit: 29.2 % — ABNORMAL LOW (ref 34.0–46.6)
Hemoglobin: 9.1 g/dL — ABNORMAL LOW (ref 11.1–15.9)
IMMATURE GRANULOCYTES: 0 %
Immature Grans (Abs): 0 10*3/uL (ref 0.0–0.1)
Lymphocytes Absolute: 1.6 10*3/uL (ref 0.7–3.1)
Lymphs: 21 %
MCH: 24 pg — ABNORMAL LOW (ref 26.6–33.0)
MCHC: 31.2 g/dL — ABNORMAL LOW (ref 31.5–35.7)
MCV: 77 fL — AB (ref 79–97)
MONOS ABS: 0.5 10*3/uL (ref 0.1–0.9)
Monocytes: 7 %
NEUTROS PCT: 67 %
Neutrophils Absolute: 5 10*3/uL (ref 1.4–7.0)
PLATELETS: 301 10*3/uL (ref 150–379)
RBC: 3.79 x10E6/uL (ref 3.77–5.28)
RDW: 18.2 % — AB (ref 12.3–15.4)
WBC: 7.4 10*3/uL (ref 3.4–10.8)

## 2014-11-06 LAB — URINE CULTURE

## 2014-11-07 ENCOUNTER — Telehealth: Payer: Self-pay

## 2014-11-07 MED ORDER — NITROFURANTOIN MONOHYD MACRO 100 MG PO CAPS: 100.0000 mg | ORAL_CAPSULE | Freq: Two times a day (BID) | ORAL | Status: DC

## 2014-11-07 NOTE — Telephone Encounter (Signed)
Okay.  Thanks.

## 2014-11-07 NOTE — Telephone Encounter (Signed)
-----   Message from Jerrol Banana., MD sent at 11/07/2014  9:55 AM EDT ----- Still anemic. As patient seen hematology recently for her anemia? Also mild UTI. Would treat with nitrofurantoin 100 mg twice a day for 3 days

## 2014-11-07 NOTE — Telephone Encounter (Signed)
Pt advised. Patient saw Dr. Cynda Acres a few months ago and she was told that everything was stable and not to come back unless there was a major change. RX sent in-aa

## 2014-11-14 ENCOUNTER — Ambulatory Visit (INDEPENDENT_AMBULATORY_CARE_PROVIDER_SITE_OTHER): Payer: Commercial Managed Care - HMO | Admitting: Urology

## 2014-11-14 ENCOUNTER — Telehealth: Payer: Self-pay | Admitting: Urology

## 2014-11-14 ENCOUNTER — Encounter: Payer: Self-pay | Admitting: Urology

## 2014-11-14 VITALS — BP 167/80 | HR 76 | Resp 16 | Ht 64.0 in | Wt 156.8 lb

## 2014-11-14 DIAGNOSIS — R339 Retention of urine, unspecified: Secondary | ICD-10-CM

## 2014-11-14 DIAGNOSIS — N390 Urinary tract infection, site not specified: Secondary | ICD-10-CM | POA: Diagnosis not present

## 2014-11-14 DIAGNOSIS — R312 Other microscopic hematuria: Secondary | ICD-10-CM | POA: Diagnosis not present

## 2014-11-14 DIAGNOSIS — R3129 Other microscopic hematuria: Secondary | ICD-10-CM

## 2014-11-14 LAB — URINALYSIS, COMPLETE
BILIRUBIN UA: NEGATIVE
Glucose, UA: NEGATIVE
Ketones, UA: NEGATIVE
Nitrite, UA: NEGATIVE
Specific Gravity, UA: 1.025 (ref 1.005–1.030)
Urobilinogen, Ur: 0.2 mg/dL (ref 0.2–1.0)
pH, UA: 6 (ref 5.0–7.5)

## 2014-11-14 LAB — MICROSCOPIC EXAMINATION
Epithelial Cells (non renal): >10 /hpf — ABNORMAL HIGH (ref 0–10)
RBC, UA: >30 /hpf — ABNORMAL HIGH (ref 0–?)
WBC, UA: >30 /hpf — ABNORMAL HIGH (ref 0–?)

## 2014-11-14 LAB — BLADDER SCAN AMB NON-IMAGING

## 2014-11-14 MED ORDER — TAMSULOSIN HCL 0.4 MG PO CAPS: 0.4000 mg | ORAL_CAPSULE | Freq: Every day | ORAL | Status: DC

## 2014-11-14 MED ORDER — DUTASTERIDE-TAMSULOSIN HCL 0.5-0.4 MG PO CAPS: 1.0000 | ORAL_CAPSULE | Freq: Every day | ORAL | Status: DC

## 2014-11-14 MED ORDER — NITROFURANTOIN MONOHYD MACRO 100 MG PO CAPS: 100.0000 mg | ORAL_CAPSULE | Freq: Every day | ORAL | Status: DC

## 2014-11-14 NOTE — Telephone Encounter (Signed)
Patient requesting that her prescriptions to be sent to [facility]. Member # [id number] [phone]

## 2014-11-14 NOTE — Progress Notes (Signed)
11/14/2014 10:20 AM   [name] [name] 1940/11/01 [id number]  Referring provider: Jerrol Banana., MD [address] [place], [place] [address]  Chief Complaint  Patient presents with  . Hematuria    New Patient    HPI: Patient has microhematuria and possible Escherichia coli infection recently. History of long-term ability to hold her urine for long periods time. Postvoid residual today is 300. Patient unable to empty her bladder further with double voiding. Urinalysis revealed WBCs RBCs of greater than 30 but no bacteria to of significance. Does not complain of urgency dysuria frequency but has occasional burning with urination. Has never been on long-term antibiotics at this problem of urinary dysfunction addressed by a urologist. Her history of diabetes and high blood pressure is noted. I see no history of smoking. She has had a history of hysterectomy many many years ago and has had no sequelae from same.  PMH: Past Medical History  Diagnosis Date  . MI (myocardial infarction)   . Hypertension   . Hyperlipidemia   . GERD (gastroesophageal reflux disease)   . Diabetes mellitus without complication     Surgical History: Past Surgical History  Procedure Laterality Date  . Appendectomy    . Abdominal hysterectomy    . Cystoscopy    . Tonsillectomy    . Coronary angioplasty with stent placement      Home Medications:    Medication List       This list is accurate as of: 11/14/14 10:20 AM.  Always use your most recent med list.               aspirin EC 81 MG tablet  Take 81 mg by mouth daily.     EAR WAX REMOVAL DROPS 6.5 % otic solution  Generic drug:  carbamide peroxide     ferrous sulfate 325 (65 FE) MG tablet  Take 1 tablet (325 mg total) by mouth daily.     ibuprofen 600 MG tablet  Commonly known as:  ADVIL,MOTRIN  TAKE 1 TABLET BY MOUTH EVERY 6 HOURS AS NEEDED PAIN     lisinopril 10 MG tablet  Commonly known as:  PRINIVIL,ZESTRIL    Take 1 tablet by mouth daily.     meloxicam 7.5 MG tablet  Commonly known as:  MOBIC  Take 1 tablet (7.5 mg total) by mouth 2 (two) times daily as needed (for bursitis.).     nitrofurantoin (macrocrystal-monohydrate) 100 MG capsule  Commonly known as:  MACROBID  Take 1 capsule (100 mg total) by mouth at bedtime.     Omeprazole 20 MG Tbec  Take 1 tablet (20 mg total) by mouth daily as needed (for upset stomach.).     tamsulosin 0.4 MG Caps capsule  Commonly known as:  FLOMAX  Take 1 capsule (0.4 mg total) by mouth daily.        Allergies:  Allergies  Allergen Reactions  . Celebrex  [Celecoxib]     Dark stools.  . Penicillins Other (See Comments)    Childhood reaction  . Prevacid  [Lansoprazole] Diarrhea    Family History: Family History  Problem Relation Age of Onset  . Hyperlipidemia Mother   . Allergies Mother   . Cerebral aneurysm Mother     cause of death at age 22  . Heart disease Father     Fatal MI ag 10  . Cancer Brother     lung cancer  . Hyperlipidemia Brother   . Colonic polyp  Brother   . Healthy Son   . Cancer Brother   . Healthy Son     Social History:  reports that she quit smoking about 16 years ago. Her smoking use included Cigarettes. She has a 2.5 pack-year smoking history. She has never used smokeless tobacco. She reports that she does not drink alcohol or use illicit drugs.  ROS: UROLOGY Frequent Urination?: No Hard to postpone urination?: No Burning/pain with urination?: Yes Get up at night to urinate?: No Leakage of urine?: No Urine stream starts and stops?: No Trouble starting stream?: No Do you have to strain to urinate?: No Blood in urine?: No Urinary tract infection?: No Sexually transmitted disease?: No Injury to kidneys or bladder?: No Painful intercourse?: No Weak stream?: No Currently pregnant?: No Vaginal bleeding?: No Last menstrual period?: n/a  Gastrointestinal Nausea?: No Vomiting?: No Indigestion/heartburn?:  No Diarrhea?: No Constipation?: No  Constitutional Fever: No Night sweats?: No Weight loss?: No Fatigue?: No  Skin Skin rash/lesions?: No Itching?: No  Eyes Blurred vision?: No Double vision?: No  Ears/Nose/Throat Sore throat?: No Sinus problems?: No  Hematologic/Lymphatic Swollen glands?: No Easy bruising?: No  Cardiovascular Leg swelling?: No Chest pain?: No  Respiratory Cough?: No Shortness of breath?: No  Endocrine Excessive thirst?: No  Musculoskeletal Back pain?: No Joint pain?: No  Neurological Headaches?: Yes Dizziness?: Yes  Psychologic Depression?: No Anxiety?: No  Physical Exam: BP 167/80 mmHg  Pulse 76  Resp 16  Ht 5\' 4"  (1.626 m)  Wt 156 lb 12.8 oz (71.124 kg)  BMI 26.90 kg/m2  Constitutional:  Alert and oriented, No acute distress. HEENT: [place] AT, moist mucus membranes.  Trachea midline, no masses. Cardiovascular: No clubbing, cyanosis, or edema. Respiratory: Normal respiratory effort, no increased work of breathing. GI: Abdomen is soft, nontender, nondistended, no abdominal masses GU: No CVA tenderness. No no cystocele rectocele or enterocele. No urethral diverticulum. Vaginal vault shows scar from previous hysterectomy. Skin: No rashes, bruises or suspicious lesions. Lymph: No cervical or inguinal adenopathy. Neurologic: Grossly intact, no focal deficits, moving all 4 extremities. Psychiatric: Normal mood and affect.  Laboratory Data: Lab Results  Component Value Date   WBC 7.4 11/04/2014   HGB 9.0* 08/19/2014   HCT 29.2* 11/04/2014   MCV 80.8 08/19/2014   PLT 241 08/19/2014    Lab Results  Component Value Date   CREATININE 0.92 09/12/2014    No results found for: PSA  No results found for: TESTOSTERONE  Lab Results  Component Value Date   HGBA1C 5.5 03/22/2012    Urinalysis    Component Value Date/Time   COLORURINE YELLOW* 08/18/2014 1616   COLORURINE Yellow 01/17/2014 1524   APPEARANCEUR CLOUDY* 08/18/2014  1616   APPEARANCEUR Cloudy 01/17/2014 1524   LABSPEC 1.023 08/18/2014 1616   LABSPEC 1.021 01/17/2014 1524   PHURINE 5.0 08/18/2014 1616   PHURINE 5.0 01/17/2014 1524   GLUCOSEU NEGATIVE 08/18/2014 1616   GLUCOSEU Negative 01/17/2014 1524   HGBUR 3+* 08/18/2014 1616   HGBUR 3+ 01/17/2014 1524   BILIRUBINUR negative 11/04/2014 1104   BILIRUBINUR NEGATIVE 08/18/2014 1616   BILIRUBINUR Negative 01/17/2014 1524   KETONESUR NEGATIVE 08/18/2014 1616   KETONESUR Negative 01/17/2014 1524   PROTEINUR trace 11/04/2014 1104   PROTEINUR 30* 08/18/2014 1616   PROTEINUR 100 mg/dL 01/17/2014 1524   UROBILINOGEN 0.2 11/04/2014 1104   NITRITE negative 11/04/2014 1104   NITRITE NEGATIVE 08/18/2014 1616   NITRITE Negative 01/17/2014 1524   LEUKOCYTESUR Negative 11/04/2014 1104   LEUKOCYTESUR 2+ 01/17/2014  1524    Pertinent Imaging: Postvoid residual persistently 300 mL   Assessment & Plan:  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 Microhematuria see discussion below chronic urinary tract infection see discussion below with plans. Eventually plan a CT scan also for the microhematuria but will do cystoscopy and her next visit as well as evaluation of urinary retention and how she is done with double voiding and timed voiding every 2 hours. The double voiding is once a day while awake. The timed voiding is when she is awake and 9 time.   1. Microscopic hematuria Patient has microscopic hematuria that may or may not be related tension. Plan is cystoscopy under next visit in  2-3 months - Urinalysis, Complete  2. UTI (lower urinary tract infection) Low-grade Escherichia coli infection water.  The right lateral quadrant. 1 bilateral white see a lot of and sometimes - Urinalysis, Complete - BLADDER SCAN AMB NON-IMAGING - nitrofurantoin, macrocrystal-monohydrate, (MACROBID) 100 MG capsule; Take 1 capsule (100 mg total) by mouth at bedtime.   Dispense: 90 capsule; Refill: 1  3. Urinary retention Postvoid residual 300 mL even after double voiding - BLADDER SCAN AMB NON-IMAGING - nitrofurantoin, macrocrystal-monohydrate, (MACROBID) 100 MG capsule; Take 1 capsule (100 mg total) by mouth at bedtime.  Dispense: 90 capsule; Refill: 1 - tamsulosin (FLOMAX) 0.4 MG CAPS capsule; Take 1 capsule (0.4 mg total) by mouth daily.  Dispense: 90 capsule; Refill: 1   No Follow-up on file.  Collier Flowers, [facility] [address] [place], [place] [address] [phone]

## 2014-11-15 NOTE — Telephone Encounter (Signed)
LMOM- need to know what medications to send to [facility].

## 2014-11-20 MED ORDER — NITROFURANTOIN MONOHYD MACRO 100 MG PO CAPS: 100.0000 mg | ORAL_CAPSULE | Freq: Every day | ORAL | Status: DC

## 2014-11-20 MED ORDER — TAMSULOSIN HCL 0.4 MG PO CAPS: 0.4000 mg | ORAL_CAPSULE | Freq: Every day | ORAL | Status: DC

## 2014-11-20 NOTE — Telephone Encounter (Signed)
Spoke with pt who stated she needed her flomax and macrobid. Both medications were sent to [facility]. Pt made aware.

## 2014-12-08 ENCOUNTER — Emergency Department
Admission: EM | Admit: 2014-12-08 | Discharge: 2014-12-09 | Disposition: A | Discharge status: Home / Self Care | Payer: Commercial Managed Care - HMO | Attending: Emergency Medicine | Admitting: Emergency Medicine

## 2014-12-08 ENCOUNTER — Encounter: Payer: Self-pay | Admitting: Emergency Medicine

## 2014-12-08 DIAGNOSIS — D649 Anemia, unspecified: Secondary | ICD-10-CM | POA: Insufficient documentation

## 2014-12-08 DIAGNOSIS — N390 Urinary tract infection, site not specified: Secondary | ICD-10-CM | POA: Diagnosis not present

## 2014-12-08 DIAGNOSIS — I10 Essential (primary) hypertension: Secondary | ICD-10-CM | POA: Insufficient documentation

## 2014-12-08 DIAGNOSIS — E119 Type 2 diabetes mellitus without complications: Secondary | ICD-10-CM | POA: Insufficient documentation

## 2014-12-08 DIAGNOSIS — H538 Other visual disturbances: Secondary | ICD-10-CM | POA: Diagnosis not present

## 2014-12-08 DIAGNOSIS — Z880 Allergy status to penicillin: Secondary | ICD-10-CM | POA: Diagnosis not present

## 2014-12-08 DIAGNOSIS — R42 Dizziness and giddiness: Secondary | ICD-10-CM | POA: Insufficient documentation

## 2014-12-08 DIAGNOSIS — Z87891 Personal history of nicotine dependence: Secondary | ICD-10-CM | POA: Insufficient documentation

## 2014-12-08 LAB — BASIC METABOLIC PANEL
ANION GAP: 6 (ref 5–15)
BUN: 24 mg/dL — ABNORMAL HIGH (ref 6–20)
CHLORIDE: 109 mmol/L (ref 101–111)
CO2: 27 mmol/L (ref 22–32)
Calcium: 9 mg/dL (ref 8.9–10.3)
Creatinine, Ser: 1.17 mg/dL — ABNORMAL HIGH (ref 0.44–1.00)
GFR calc non Af Amer: 44 mL/min — ABNORMAL LOW (ref >60)
GFR, EST AFRICAN AMERICAN: 51 mL/min — AB (ref >60)
Glucose, Bld: 96 mg/dL (ref 65–99)
POTASSIUM: 4.1 mmol/L (ref 3.5–5.1)
SODIUM: 142 mmol/L (ref 135–145)

## 2014-12-08 LAB — CBC
HEMATOCRIT: 27.5 % — AB (ref 35.0–47.0)
Hemoglobin: 8.6 g/dL — ABNORMAL LOW (ref 12.0–16.0)
MCH: 24 pg — ABNORMAL LOW (ref 26.0–34.0)
MCHC: 31.2 g/dL — ABNORMAL LOW (ref 32.0–36.0)
MCV: 76.8 fL — AB (ref 80.0–100.0)
Platelets: 250 10*3/uL (ref 150–440)
RBC: 3.58 MIL/uL — AB (ref 3.80–5.20)
RDW: 17.8 % — ABNORMAL HIGH (ref 11.5–14.5)
WBC: 7.1 10*3/uL (ref 3.6–11.0)

## 2014-12-08 LAB — URINALYSIS COMPLETE WITH MICROSCOPIC (ARMC ONLY)
BACTERIA UA: NONE SEEN
Bilirubin Urine: NEGATIVE
GLUCOSE, UA: NEGATIVE mg/dL
KETONES UR: NEGATIVE mg/dL
NITRITE: NEGATIVE
PROTEIN: 100 mg/dL — AB
SPECIFIC GRAVITY, URINE: 1.023 (ref 1.005–1.030)
pH: 5 (ref 5.0–8.0)

## 2014-12-08 MED ORDER — DEXTROSE 5 % IV SOLN
1.0000 g | Freq: Once | INTRAVENOUS | Status: AC
  Administered 2014-12-08: 1 g via INTRAVENOUS
  Filled 2014-12-08: qty 10

## 2014-12-08 MED ORDER — CIPROFLOXACIN HCL 500 MG PO TABS: 500.0000 mg | ORAL_TABLET | Freq: Two times a day (BID) | ORAL | Status: AC

## 2014-12-08 MED ORDER — SODIUM CHLORIDE 0.9 % IV SOLN
Freq: Once | INTRAVENOUS | Status: AC
  Administered 2014-12-08: 23:00:00 via INTRAVENOUS

## 2014-12-08 NOTE — ED Notes (Signed)
Pt c/o dizziness since approx Thursday. Pt states this has happened to her before and she had low hemoglobin. Denies pain, weakness, LOC, states she has intermittent blurred vision. Pt states dizziness is intermittent as well.

## 2014-12-08 NOTE — ED Provider Notes (Signed)
[facility] Emergency Department Provider Note     Time seen: ----------------------------------------- 9:51 PM on 12/08/2014 -----------------------------------------    I have reviewed the triage vital signs and the nursing notes.   HISTORY  Chief Complaint Dizziness    HPI [name] [name] is a 74 y.o. female who presents ER for dizziness since approximately Thursday. Patient states is having to her before she's had low hemoglobin sense of heart attack last year. She denies any pain or specific weakness. Patient states she's intermittently had blurred vision, recent treated for UTI by Dr. Elnoria Howard. She denies any other complaints.   Past Medical History  Diagnosis Date  . MI (myocardial infarction) ([place])   . Hypertension   . Hyperlipidemia   . GERD (gastroesophageal reflux disease)     Patient Active Problem List   Diagnosis Date Noted  . UTI (lower urinary tract infection) 08/18/2014  . Blurry vision 08/18/2014  . Allergic rhinitis 07/12/2014  . Absolute anemia 07/12/2014  . Baker's cyst of knee 07/12/2014  . Atherosclerosis of coronary artery 07/12/2014  . CAFL (chronic airflow limitation) ([place]) 07/12/2014  . Dizziness 07/12/2014  . Essential (primary) hypertension 07/12/2014  . Acid reflux 07/12/2014  . 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 History of colon polyps 07/12/2014  . Hypercholesteremia 07/12/2014  . Malaise and fatigue 07/12/2014  . Heart attack ([place]) 07/12/2014  . Muscle ache 07/12/2014  . Arthritis, degenerative 07/12/2014  . Peripheral blood vessel disorder ([place]) 07/12/2014  . Breath shortness 07/12/2014  . Bradycardia 08/23/2013  . Arteriosclerosis of coronary artery 08/17/2013  . Diabetes ([place]) 08/17/2013    Past Surgical History  Procedure Laterality Date  . Appendectomy    . Abdominal hysterectomy    . Cystoscopy    . Tonsillectomy    . Coronary angioplasty with stent placement      Allergies Celebrex ;  Penicillins; and Prevacid   Social History Social History  Substance Use Topics  . Smoking status: Former Smoker -- 0.25 packs/day for 10 years    Types: Cigarettes    Quit date: 03/06/1998  . Smokeless tobacco: Never Used  . Alcohol Use: No    Review of Systems Constitutional: Negative for fever. Eyes: Negative for visual changes. ENT: Negative for sore throat. Cardiovascular: Negative for chest pain. Respiratory: Negative for shortness of breath. Gastrointestinal: Negative for abdominal pain, vomiting and diarrhea. Genitourinary: Positive for dysuria Musculoskeletal: Negative for back pain. Skin: Negative for rash. Neurological: Negative for headaches, focal weakness or numbness. Positive for dizziness  10-point ROS otherwise negative.  ____________________________________________   PHYSICAL EXAM:  VITAL SIGNS: ED Triage Vitals  Enc Vitals Group     BP 12/08/14 1738 124/59 mmHg     Pulse Rate 12/08/14 1738 79     Resp 12/08/14 1738 18     Temp 12/08/14 1738 98.1 F (36.7 C)     Temp Source 12/08/14 1738 Oral     SpO2 12/08/14 1738 96 %     Weight 12/08/14 1738 156 lb (70.761 kg)     Height 12/08/14 1738 5\' 5"  (1.651 m)     Head Cir --      Peak Flow --      Pain Score 12/08/14 1743 0     Pain Loc --      Pain Edu? --      Excl. in [place]? --     Constitutional: Alert and oriented. Well appearing and in no distress. Eyes: Conjunctivae are normal. PERRL. Normal extraocular movements. ENT  Head: Normocephalic and atraumatic.   Nose: No congestion/rhinnorhea.   Mouth/Throat: Mucous membranes are moist.   Neck: No stridor. Cardiovascular: Normal rate, regular rhythm. Normal and symmetric distal pulses are present in all extremities. No murmurs, rubs, or gallops. Respiratory: Normal respiratory effort without tachypnea nor retractions. Breath sounds are clear and equal bilaterally. No wheezes/rales/rhonchi. Gastrointestinal: Soft and nontender. No  distention. No abdominal bruits.  Musculoskeletal: Nontender with normal range of motion in all extremities. No joint effusions.  No lower extremity tenderness nor edema. Neurologic:  Normal speech and language. No gross focal neurologic deficits are appreciated. Speech is normal. No gait instability. Skin:  Skin is warm, dry and intact. No rash noted. Psychiatric: Mood and affect are normal. Speech and behavior are normal. Patient exhibits appropriate insight and judgment. ____________________________________________  EKG: Interpreted by me. Normal sinus rhythm with rate of 77 bpm, normal PR interval, normal care of his with, normal QT interval. Normal axis.  ____________________________________________  ED COURSE:  Pertinent labs & imaging results that were available during my care of the patient were reviewed by me and considered in my medical decision making (see chart for details). We'll check basic labs, give fluids and check a urinalysis. ____________________________________________    LABS (pertinent positives/negatives)  Labs Reviewed  BASIC METABOLIC PANEL - Abnormal; Notable for the following:    BUN 24 (*)    Creatinine, Ser 1.17 (*)    GFR calc non Af Amer 44 (*)    GFR calc Af Amer 51 (*)    All other components within normal limits  CBC - Abnormal; Notable for the following:    RBC 3.58 (*)    Hemoglobin 8.6 (*)    HCT 27.5 (*)    MCV 76.8 (*)    MCH 24.0 (*)    MCHC 31.2 (*)    RDW 17.8 (*)    All other components within normal limits  URINALYSIS COMPLETEWITH MICROSCOPIC ([facility] ONLY) - Abnormal; Notable for the following:    Color, Urine YELLOW (*)    APPearance HAZY (*)    Hgb urine dipstick 2+ (*)    Protein, ur 100 (*)    Leukocytes, UA 2+ (*)    Squamous Epithelial / LPF 6-30 (*)    All other components within normal limits  URINE CULTURE  CBG MONITORING, ED   ____________________________________________  FINAL ASSESSMENT AND PLAN  Dizziness,  anemia, UTI  Plan: Patient with labs and imaging as dictated above. Patient with a known history of anemia. Her labs appear stable since June of this year. However for symptoms persist she will need blood transfusion by the cancer center. Patient understands this, I given her IV Rocephin to cover her urine as well as IV fluid bolus for her dizziness. She is stable for outpatient follow-up with urology. Also send urine culture for these frequent UTIs.   Earleen Newport, MD   Earleen Newport, MD 12/08/14 2258

## 2014-12-08 NOTE — Discharge Instructions (Signed)
Anemia, Nonspecific °Anemia is a condition in which the concentration of red blood cells or hemoglobin in the blood is below normal. Hemoglobin is a substance in red blood cells that carries oxygen to the tissues of the body. Anemia results in not enough oxygen reaching these tissues.  °CAUSES  °Common causes of anemia include:  °· Excessive bleeding. Bleeding may be internal or external. This includes excessive bleeding from periods (in women) or from the intestine.   °· Poor nutrition.   °· Chronic kidney, thyroid, and liver disease.  °· Bone marrow disorders that decrease red blood cell production. °· Cancer and treatments for cancer. °· HIV, AIDS, and their treatments. °· Spleen problems that increase red blood cell destruction. °· Blood disorders. °· Excess destruction of red blood cells due to infection, medicines, and autoimmune disorders. °SIGNS AND SYMPTOMS  °· Minor weakness.   °· Dizziness.   °· Headache. °· Palpitations.   °· Shortness of breath, especially with exercise.   °· Paleness. °· Cold sensitivity. °· Indigestion. °· Nausea. °· Difficulty sleeping. °· Difficulty concentrating. °Symptoms may occur suddenly or they may develop slowly.  °DIAGNOSIS  °Additional blood tests are often needed. These help your health care provider determine the best treatment. Your health care provider will check your stool for blood and look for other causes of blood loss.  °TREATMENT  °Treatment varies depending on the cause of the anemia. Treatment can include:  °· Supplements of iron, vitamin B12, or folic acid.   °· Hormone medicines.   °· A blood transfusion. This may be needed if blood loss is severe.   °· Hospitalization. This may be needed if there is significant continual blood loss.   °· Dietary changes. °· Spleen removal. °HOME CARE INSTRUCTIONS °Keep all follow-up appointments. It often takes many weeks to correct anemia, and having your health care provider check on your condition and your response to  treatment is very important. °SEEK IMMEDIATE MEDICAL CARE IF:  °· You develop extreme weakness, shortness of breath, or chest pain.   °· You become dizzy or have trouble concentrating. °· You develop heavy vaginal bleeding.   °· You develop a rash.   °· You have bloody or black, tarry stools.   °· You faint.   °· You vomit up blood.   °· You vomit repeatedly.   °· You have abdominal pain. °· You have a fever or persistent symptoms for more than 2-3 days.   °· You have a fever and your symptoms suddenly get worse.   °· You are dehydrated.   °MAKE SURE YOU: °· Understand these instructions. °· Will watch your condition. °· Will get help right away if you are not doing well or get worse. °Document Released: 04/01/2004 Document Revised: 10/25/2012 Document Reviewed: 08/18/2012 °ExitCare® Patient Information ©2015 [employer]. This information is not intended to replace advice given to you by your health care provider. Make sure you discuss any questions you have with your health care provider. ° ° °Urinary Tract Infection °Urinary tract infections (UTIs) can develop anywhere along your urinary tract. Your urinary tract is your body's drainage system for removing wastes and extra water. Your urinary tract includes two kidneys, two ureters, a bladder, and a urethra. Your kidneys are a pair of bean-shaped organs. Each kidney is about the size of your fist. They are located below your ribs, one on each side of your spine. °CAUSES °Infections are caused by microbes, which are microscopic organisms, including fungi, viruses, and bacteria. These organisms are so small that they can only be seen through a microscope. Bacteria are the microbes that most commonly   cause UTIs. °SYMPTOMS  °Symptoms of UTIs may vary by age and gender of the patient and by the location of the infection. Symptoms in young women typically include a frequent and intense urge to urinate and a painful, burning feeling in the bladder or urethra during  urination. Older women and men are more likely to be tired, shaky, and weak and have muscle aches and abdominal pain. A fever may mean the infection is in your kidneys. Other symptoms of a kidney infection include pain in your back or sides below the ribs, nausea, and vomiting. °DIAGNOSIS °To diagnose a UTI, your caregiver will ask you about your symptoms. Your caregiver also will ask to provide a urine sample. The urine sample will be tested for bacteria and white blood cells. White blood cells are made by your body to help fight infection. °TREATMENT  °Typically, UTIs can be treated with medication. Because most UTIs are caused by a bacterial infection, they usually can be treated with the use of antibiotics. The choice of antibiotic and length of treatment depend on your symptoms and the type of bacteria causing your infection. °HOME CARE INSTRUCTIONS °· If you were prescribed antibiotics, take them exactly as your caregiver instructs you. Finish the medication even if you feel better after you have only taken some of the medication. °· Drink enough water and fluids to keep your urine clear or pale yellow. °· Avoid caffeine, tea, and carbonated beverages. They tend to irritate your bladder. °· Empty your bladder often. Avoid holding urine for long periods of time. °· Empty your bladder before and after sexual intercourse. °· After a bowel movement, women should cleanse from front to back. Use each tissue only once. °SEEK MEDICAL CARE IF:  °· You have back pain. °· You develop a fever. °· Your symptoms do not begin to resolve within 3 days. °SEEK IMMEDIATE MEDICAL CARE IF:  °· You have severe back pain or lower abdominal pain. °· You develop chills. °· You have nausea or vomiting. °· You have continued burning or discomfort with urination. °MAKE SURE YOU:  °· Understand these instructions. °· Will watch your condition. °· Will get help right away if you are not doing well or get worse. °Document Released:  12/02/2004 Document Revised: 08/24/2011 Document Reviewed: 04/02/2011 °ExitCare® Patient Information ©2015 [employer]. This information is not intended to replace advice given to you by your health care provider. Make sure you discuss any questions you have with your health care provider. ° °

## 2014-12-09 LAB — GLUCOSE, CAPILLARY: Glucose-Capillary: 86 mg/dL (ref 65–99)

## 2014-12-09 NOTE — ED Notes (Signed)
Blood sugar was 86

## 2014-12-10 ENCOUNTER — Telehealth: Payer: Self-pay | Admitting: Family Medicine

## 2014-12-10 NOTE — Telephone Encounter (Signed)
Pt was discharged from [facility] ER 12/08/2014 for being dizzy.  I have scheduled a hospital follow up appt for 12/11/2014/MW

## 2014-12-11 ENCOUNTER — Ambulatory Visit (INDEPENDENT_AMBULATORY_CARE_PROVIDER_SITE_OTHER): Payer: Commercial Managed Care - HMO | Admitting: Family Medicine

## 2014-12-11 ENCOUNTER — Encounter: Payer: Self-pay | Admitting: Family Medicine

## 2014-12-11 VITALS — BP 132/60 | HR 78 | Temp 97.6°F | Resp 16 | Wt 159.0 lb

## 2014-12-11 DIAGNOSIS — B372 Candidiasis of skin and nail: Secondary | ICD-10-CM

## 2014-12-11 DIAGNOSIS — D649 Anemia, unspecified: Secondary | ICD-10-CM | POA: Diagnosis not present

## 2014-12-11 DIAGNOSIS — N390 Urinary tract infection, site not specified: Secondary | ICD-10-CM | POA: Diagnosis not present

## 2014-12-11 MED ORDER — CIPROFLOXACIN HCL 250 MG PO TABS: 250.0000 mg | ORAL_TABLET | Freq: Every day | ORAL | Status: DC

## 2014-12-11 MED ORDER — FERROUS SULFATE 325 (65 FE) MG PO TABS: 325.0000 mg | ORAL_TABLET | Freq: Every day | ORAL | Status: DC

## 2014-12-11 MED ORDER — NYSTATIN 100000 UNIT/GM EX CREA: 1.0000 "application " | TOPICAL_CREAM | Freq: Two times a day (BID) | CUTANEOUS | Status: DC

## 2014-12-11 NOTE — Progress Notes (Signed)
Patient ID: [name] [name], female   DOB: 03-24-1940, 74 y.o.   MRN: [id number]    Subjective:  HPI Pt was seen in the ER 12/08/14 for dizziness. She was told that more than likely most of dizziness was coming from her anemia. EKG was done, she had labs done and her hemoglobin was 8.6. She was given fluids. She was also treated for a UTI with IV Rocephin and sent home on Cipro. She was previously treat with Macrobid from Urology.  The ER MD told her that he would rather her follow up with PCP for anemia treatment. She is not currently taking any Iron Supplements. Pt reports that the dizziness is much better.   Prior to Admission medications   Medication Sig Start Date End Date Taking? Authorizing Provider  aspirin EC 81 MG tablet Take 81 mg by mouth daily.   Yes Historical Provider, MD  ciprofloxacin (CIPRO) 500 MG tablet Take 1 tablet (500 mg total) by mouth 2 (two) times daily. 12/08/14 12/18/14 Yes Earleen Newport, MD  lisinopril (PRINIVIL,ZESTRIL) 10 MG tablet Take 1 tablet by mouth daily.   Yes Historical Provider, MD  meloxicam (MOBIC) 7.5 MG tablet Take 1 tablet (7.5 mg total) by mouth 2 (two) times daily as needed (for bursitis.). 08/28/14  Yes Richard Maceo Pro., MD  Omeprazole 20 MG TBEC Take 1 tablet (20 mg total) by mouth daily as needed (for upset stomach.). 09/02/14  Yes Jerrol Banana., MD  tamsulosin (FLOMAX) 0.4 MG CAPS capsule Take 1 capsule (0.4 mg total) by mouth daily. 11/20/14  Yes Collier Flowers, MD  ferrous sulfate 325 (65 FE) MG tablet Take 1 tablet (325 mg total) by mouth daily. Patient not taking: Reported on 11/14/2014 08/20/14   Fritzi Mandes, MD    Patient Active Problem List   Diagnosis Date Noted  . UTI (lower urinary tract infection) 08/18/2014  . Blurry vision 08/18/2014  . Allergic rhinitis 07/12/2014  . Absolute anemia 07/12/2014  . Baker's cyst of knee 07/12/2014  . Atherosclerosis of coronary artery 07/12/2014  . CAFL (chronic airflow limitation)  ([place]) 07/12/2014  . Dizziness 07/12/2014  . Essential (primary) hypertension 07/12/2014  . Acid reflux 07/12/2014  . 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 History of colon polyps 07/12/2014  . Hypercholesteremia 07/12/2014  . Malaise and fatigue 07/12/2014  . Heart attack ([place]) 07/12/2014  . Muscle ache 07/12/2014  . Arthritis, degenerative 07/12/2014  . Peripheral blood vessel disorder ([place]) 07/12/2014  . Breath shortness 07/12/2014  . Bradycardia 08/23/2013  . Arteriosclerosis of coronary artery 08/17/2013  . Diabetes ([place]) 08/17/2013    Past Medical History  Diagnosis Date  . MI (myocardial infarction) ([place])   . Hypertension   . Hyperlipidemia   . GERD (gastroesophageal reflux disease)     Social History   Social History  . Marital Status: Married    Spouse Name: [name]  . Number of Children: 2  . Years of Education: N/A   Occupational History  . retired    Social History Main Topics  . Smoking status: Former Smoker -- 0.25 packs/day for 10 years    Types: Cigarettes    Quit date: 03/06/1998  . Smokeless tobacco: Never Used  . Alcohol Use: No  . Drug Use: No  . Sexual Activity: Yes    Birth Control/ Protection: Surgical   Other Topics Concern  . Not on file   Social History Narrative    Allergies  Allergen Reactions  . Celebrex  [  Celecoxib]     Dark stools.  . Penicillins Other (See Comments)    Childhood reaction  . Prevacid  [Lansoprazole] Diarrhea    Review of Systems  Constitutional: Negative.   HENT: Negative.   Eyes: Negative.   Respiratory: Negative.   Cardiovascular: Negative.   Gastrointestinal: Negative.   Genitourinary: Negative.   Musculoskeletal: Negative.   Skin: Negative.   Neurological: Positive for dizziness.  Endo/Heme/Allergies: Negative.   Psychiatric/Behavioral: Negative.     Immunization History  Administered Date(s) Administered  . Pneumococcal Conjugate-13 02/06/2014  . Pneumococcal Polysaccharide-23 07/04/2012  .  Tdap 07/04/2012  . Zoster 07/04/2012   Objective:  BP 132/60 mmHg  Pulse 78  Temp(Src) 97.6 F (36.4 C) (Oral)  Resp 16  Wt 159 lb (72.122 kg)  Physical Exam  Constitutional: She is oriented to person, place, and time and well-developed, well-nourished, and in no distress.  HENT:  Head: Normocephalic and atraumatic.  Right Ear: External ear normal.  Left Ear: External ear normal.  Nose: Nose normal.  Eyes: Conjunctivae are normal.  Neck: Neck supple.  Cardiovascular: Normal rate, regular rhythm and normal heart sounds.   Pulmonary/Chest: Effort normal and breath sounds normal.  Abdominal: Soft.  Neurological: She is alert and oriented to person, place, and time.  Skin: Skin is warm and dry.  Psychiatric: Mood, memory, affect and judgment normal.    Lab Results  Component Value Date   WBC 7.1 12/08/2014   HGB 8.6* 12/08/2014   HCT 27.5* 12/08/2014   PLT 250 12/08/2014   GLUCOSE 96 12/08/2014   CHOL 118 04/11/2013   TRIG 91 04/11/2013   HDL 37 04/11/2013   LDLCALC 63 04/11/2013   TSH 1.59 05/22/2014   HGBA1C 5.5 03/22/2012    CMP     Component Value Date/Time   NA 142 12/08/2014 1748   NA 142 09/12/2014 1119   NA 144 12/31/2013 0056   K 4.1 12/08/2014 1748   K 4.1 12/31/2013 0056   CL 109 12/08/2014 1748   CL 108* 12/31/2013 0056   CO2 27 12/08/2014 1748   CO2 30 12/31/2013 0056   GLUCOSE 96 12/08/2014 1748   GLUCOSE 89 09/12/2014 1119   GLUCOSE 101* 12/31/2013 0056   BUN 24* 12/08/2014 1748   BUN 19 09/12/2014 1119   BUN 18 12/31/2013 0056   CREATININE 1.17* 12/08/2014 1748   CREATININE 1.2* 05/22/2014   CREATININE 1.27 01/17/2014 1520   CALCIUM 9.0 12/08/2014 1748   CALCIUM 8.1* 12/31/2013 0056   PROT 8.1 03/21/2012 1953   ALBUMIN 4.1 03/21/2012 1953   AST 16 07/11/2013   AST 19 03/21/2012 1953   ALT 14 07/11/2013   ALT 18 03/21/2012 1953   ALKPHOS 67 07/11/2013   ALKPHOS 74 03/21/2012 1953   BILITOT 0.4 03/21/2012 1953   GFRNONAA 44*  12/08/2014 1748   GFRNONAA 44* 01/17/2014 1520   GFRNONAA 59* 03/25/2012 0611   GFRAA 51* 12/08/2014 1748   GFRAA 53* 01/17/2014 1520   GFRAA >60 03/25/2012 0611    Assessment and Plan :  1. Anemia, unspecified anemia type  - ferrous sulfate 325 (65 FE) MG tablet; Take 1 tablet (325 mg total) by mouth daily.  Dispense: 30 tablet; Refill: 12  2. Yeast dermatitis Yeast on KOH. May need derm referral. Oval wound under left breast --42mm by 13mm in size. - nystatin cream (MYCOSTATIN); Apply 1 application topically 2 (two) times daily.  Dispense: 30 g; Refill: 0 Keep this dry was stressed. 3. Recurrent UTI  -  ciprofloxacin (CIPRO) 250 MG tablet; Take 1 tablet (250 mg total) by mouth daily.  Dispense: 30 tablet; Refill: [facility] 12/11/2014 11:03 AM

## 2014-12-11 NOTE — Telephone Encounter (Signed)
Patient on schedule today at 10:45. Just FYI.

## 2014-12-26 DIAGNOSIS — E782 Mixed hyperlipidemia: Secondary | ICD-10-CM | POA: Insufficient documentation

## 2014-12-27 ENCOUNTER — Encounter: Payer: Self-pay | Admitting: Urology

## 2014-12-27 ENCOUNTER — Ambulatory Visit (INDEPENDENT_AMBULATORY_CARE_PROVIDER_SITE_OTHER): Payer: Commercial Managed Care - HMO | Admitting: Urology

## 2014-12-27 VITALS — BP 182/80 | HR 80 | Ht 65.0 in | Wt 159.4 lb

## 2014-12-27 DIAGNOSIS — R3129 Other microscopic hematuria: Secondary | ICD-10-CM | POA: Diagnosis not present

## 2014-12-27 DIAGNOSIS — R339 Retention of urine, unspecified: Secondary | ICD-10-CM

## 2014-12-27 DIAGNOSIS — N390 Urinary tract infection, site not specified: Secondary | ICD-10-CM

## 2014-12-27 LAB — URINALYSIS, COMPLETE
Bilirubin, UA: NEGATIVE
GLUCOSE, UA: NEGATIVE
Ketones, UA: NEGATIVE
Nitrite, UA: NEGATIVE
Specific Gravity, UA: 1.025 (ref 1.005–1.030)
UUROB: 0.2 mg/dL (ref 0.2–1.0)
pH, UA: 6 (ref 5.0–7.5)

## 2014-12-27 LAB — MICROSCOPIC EXAMINATION
RBC, UA: >30 /hpf — AB (ref 0–?)
RENAL EPITHEL UA: NONE SEEN /HPF
WBC, UA: >30 /hpf — AB (ref 0–?)

## 2014-12-27 LAB — BLADDER SCAN AMB NON-IMAGING: SCAN RESULT: 354

## 2014-12-27 MED ORDER — TRIMETHOPRIM 100 MG PO TABS: 100.0000 mg | ORAL_TABLET | Freq: Every day | ORAL | Status: DC

## 2014-12-27 MED ORDER — CIPROFLOXACIN HCL 500 MG PO TABS
500.0000 mg | ORAL_TABLET | Freq: Once | ORAL | Status: AC
  Administered 2014-12-27: 500 mg via ORAL

## 2014-12-27 MED ORDER — LIDOCAINE HCL 2 % EX GEL
1.0000 "application " | Freq: Once | CUTANEOUS | Status: AC
  Administered 2014-12-27: 1 via URETHRAL

## 2014-12-27 NOTE — Progress Notes (Signed)
Bladder Scan Patient void: 354 ml Performed By: Bonnye Fava, CMA

## 2014-12-27 NOTE — Progress Notes (Signed)
    Cystoscopy Procedure Note  Patient identification was confirmed, informed consent was obtained, and patient was prepped using Betadine solution.  Lidocaine jelly was administered per urethral meatus.    Preoperative abx where received prior to procedure.    Procedure: - Flexible cystoscope introduced, without any difficulty.   - Thorough search of the bladder revealed:    Severe stricture of the urethra. Unable to instrument the urethra until I dilated the stricture.    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 This stricture has been present for many years. She had one diverticulum in one area of trabeculation in her bladder that was a widemouth trabeculation the would soon become another diverticulum.  - Ureteral orifices were normal in position and appearance.  Post-Procedure: - Patient tolerated the procedure  we obtained a postvoid residual. This is done because now she can void. I will now continue her on Flomax which was the appropriate medication but she needed a dilatation. We'll do a dilatation again in 3 months. I'll also maintain her on Macrobid which should now work for her chronic UTIs because she has a dilated urethra and no pinhole urethral opening at the bladder neck this was a tiny tiny tiny urethra at the  bladder neck

## 2015-01-02 ENCOUNTER — Telehealth: Payer: Self-pay

## 2015-01-02 NOTE — Telephone Encounter (Signed)
Patient wants you to call her regarding her appointment that she has with the Dermatologist in December. Per patient cannot keep this appointment. If you can call her. CB number is [phone].  Thanks,  -Jenisse Vullo

## 2015-01-06 ENCOUNTER — Ambulatory Visit: Payer: Commercial Managed Care - HMO | Admitting: Family Medicine

## 2015-01-16 ENCOUNTER — Ambulatory Visit: Payer: Commercial Managed Care - HMO | Admitting: Family Medicine

## 2015-01-21 ENCOUNTER — Ambulatory Visit
Admission: RE | Admit: 2015-01-21 | Discharge: 2015-01-21 | Disposition: A | Discharge status: Home / Self Care | Payer: Commercial Managed Care - HMO | Source: Ambulatory Visit | Attending: Urology | Admitting: Urology

## 2015-01-21 DIAGNOSIS — K449 Diaphragmatic hernia without obstruction or gangrene: Secondary | ICD-10-CM | POA: Insufficient documentation

## 2015-01-21 DIAGNOSIS — R3129 Other microscopic hematuria: Secondary | ICD-10-CM | POA: Insufficient documentation

## 2015-01-21 DIAGNOSIS — N323 Diverticulum of bladder: Secondary | ICD-10-CM | POA: Insufficient documentation

## 2015-01-21 DIAGNOSIS — K7689 Other specified diseases of liver: Secondary | ICD-10-CM | POA: Diagnosis not present

## 2015-01-21 DIAGNOSIS — K5730 Diverticulosis of large intestine without perforation or abscess without bleeding: Secondary | ICD-10-CM | POA: Diagnosis not present

## 2015-01-21 LAB — POCT I-STAT CREATININE: Creatinine, Ser: 1.3 mg/dL — ABNORMAL HIGH (ref 0.44–1.00)

## 2015-01-21 MED ORDER — IOHEXOL 300 MG/ML  SOLN
100.0000 mL | Freq: Once | INTRAMUSCULAR | Status: AC | PRN
  Administered 2015-01-21: 100 mL via INTRAVENOUS

## 2015-01-23 ENCOUNTER — Encounter: Payer: Self-pay | Admitting: Urology

## 2015-01-23 ENCOUNTER — Ambulatory Visit (INDEPENDENT_AMBULATORY_CARE_PROVIDER_SITE_OTHER): Payer: Commercial Managed Care - HMO | Admitting: Urology

## 2015-01-23 VITALS — BP 153/68 | HR 93 | Ht 64.0 in | Wt 155.5 lb

## 2015-01-23 DIAGNOSIS — N323 Diverticulum of bladder: Secondary | ICD-10-CM | POA: Diagnosis not present

## 2015-01-23 DIAGNOSIS — N359 Urethral stricture, unspecified: Secondary | ICD-10-CM | POA: Diagnosis not present

## 2015-01-23 DIAGNOSIS — N3592 Unspecified urethral stricture, female: Secondary | ICD-10-CM

## 2015-01-23 DIAGNOSIS — R339 Retention of urine, unspecified: Secondary | ICD-10-CM

## 2015-01-23 LAB — MICROSCOPIC EXAMINATION

## 2015-01-23 LAB — URINALYSIS, COMPLETE
BILIRUBIN UA: NEGATIVE
Glucose, UA: NEGATIVE
KETONES UA: NEGATIVE
NITRITE UA: POSITIVE — AB
SPEC GRAV UA: 1.02 (ref 1.005–1.030)
UUROB: 0.2 mg/dL (ref 0.2–1.0)
pH, UA: 6.5 (ref 5.0–7.5)

## 2015-01-23 LAB — BLADDER SCAN AMB NON-IMAGING

## 2015-01-23 NOTE — Progress Notes (Signed)
01/23/2015 10:51 AM   [name] [name] 09/04/1940 [id number]  Referring provider: Jerrol Banana., MD [address] [place], [place] [address]  Chief Complaint  Patient presents with  . Results    HPI: 74 year old woman who presents today for follow-up of CT urogram. She has a history of dysfunctional voiding/ chronic urinary retention, ability to hold her urine for a long period of time and elevated postvoid residuals as high as 300. She also has a history of microscopic hematuria as well as Escherichia coli UTIs. At last visit, she underwent cystoscopy revealing a urethral stricture which was dilated. She was found have multiple bladder diverticula.  CT urogram was reviewed today with the patient. This reveals 3 bladder diverticula which were appreciated on cystoscopy. There is no other obvious GU pathology.  Since last visit, she was instructed to double void and she has been trying to do so.  She was also started on Flomax for incomplete bladder emptying.    Today, she has no voiding complaints. No urinary urgency or frequency. No dysuria or gross hematuria.  PMH: Past Medical History  Diagnosis Date  . MI (myocardial infarction) ([place])   . Hypertension   . Hyperlipidemia   . GERD (gastroesophageal reflux disease)     Surgical History: Past Surgical History  Procedure Laterality Date  . Appendectomy    . Abdominal hysterectomy    . Cystoscopy    . Tonsillectomy    . Coronary angioplasty with stent placement      Home Medications:    Medication List       This list is accurate as of: 01/23/15 10:51 AM.  Always use your most recent med list.               aspirin EC 81 MG tablet  Take 81 mg by mouth daily.     ferrous sulfate 325 (65 FE) MG tablet  Take 1 tablet (325 mg total) by mouth daily.     fluocinonide ointment 0.05 %  Commonly known as:  LIDEX     lisinopril 10 MG tablet  Commonly known as:  PRINIVIL,ZESTRIL  Take 1 tablet  by mouth daily.     meloxicam 7.5 MG tablet  Commonly known as:  MOBIC  Take 1 tablet (7.5 mg total) by mouth 2 (two) times daily as needed (for bursitis.).     Omeprazole 20 MG Tbec  Take 1 tablet (20 mg total) by mouth daily as needed (for upset stomach.).     tamsulosin 0.4 MG Caps capsule  Commonly known as:  FLOMAX  Take 1 capsule (0.4 mg total) by mouth daily.        Allergies:  Allergies  Allergen Reactions  . Celebrex  [Celecoxib]     Dark stools.  . Penicillins Other (See Comments)    Childhood reaction  . Prevacid  [Lansoprazole] Diarrhea    Family History: Family History  Problem Relation Age of Onset  . Hyperlipidemia Mother   . Allergies Mother   . Cerebral aneurysm Mother     cause of death at age 36  . Heart disease Father     Fatal MI ag 64  . Cancer Brother     lung cancer  . Hyperlipidemia Brother   . Colonic polyp Brother   . Healthy Son   . Cancer Brother   . Healthy Son     Social History:  reports that she quit smoking about 16 years ago. Her smoking  use included Cigarettes. She has a 2.5 pack-year smoking history. She has never used smokeless tobacco. She reports that she does not drink alcohol or use illicit drugs.  ROS: UROLOGY Frequent Urination?: No Hard to postpone urination?: No Burning/pain with urination?: No Get up at night to urinate?: No Leakage of urine?: No Urine stream starts and stops?: No Trouble starting stream?: No Do you have to strain to urinate?: No Blood in urine?: No Urinary tract infection?: No Sexually transmitted disease?: No Injury to kidneys or bladder?: No Painful intercourse?: No Weak stream?: No Currently pregnant?: No Vaginal bleeding?: No Last menstrual period?: n  Gastrointestinal Nausea?: No Vomiting?: No Indigestion/heartburn?: No Diarrhea?: No Constipation?: No  Constitutional Fever: No Night sweats?: No Weight loss?: No Fatigue?: No  Skin Skin rash/lesions?: Yes Itching?:  No  Eyes Blurred vision?: No Double vision?: No  Ears/Nose/Throat Sore throat?: No Sinus problems?: No  Hematologic/Lymphatic Swollen glands?: No Easy bruising?: No  Cardiovascular Leg swelling?: No Chest pain?: No  Respiratory Cough?: No Shortness of breath?: No  Endocrine Excessive thirst?: No  Musculoskeletal Back pain?: No Joint pain?: No  Neurological Headaches?: No Dizziness?: No  Psychologic Depression?: No Anxiety?: No  Physical Exam: BP 153/68 mmHg  Pulse 93  Ht 5\' 4"  (1.626 m)  Wt 155 lb 8 oz (70.534 kg)  BMI 26.68 kg/m2  Constitutional:  Alert and oriented, No acute distress. HEENT: [place] AT, moist mucus membranes.  Trachea midline, no masses. Cardiovascular: No clubbing, cyanosis, or edema. Respiratory: Normal respiratory effort, no increased work of breathing..  Skin: No rashes, bruises or suspicious lesions. Neurologic: Grossly intact, no focal deficits, moving all 4 extremities. Psychiatric: Normal mood and affect.  Laboratory Data: Lab Results  Component Value Date   WBC 7.1 12/08/2014   HGB 8.6* 12/08/2014   HCT 27.5* 12/08/2014   MCV 76.8* 12/08/2014   PLT 250 12/08/2014    Lab Results  Component Value Date   CREATININE 1.30* 01/21/2015    Lab Results  Component Value Date   HGBA1C 5.5 03/22/2012    Urinalysis Results for orders placed or performed in visit on 01/23/15  BLADDER SCAN AMB NON-IMAGING  Result Value Ref Range   Scan Result 225ml     Pertinent Imaging: PVR  Results for orders placed or performed in visit on 01/23/15  BLADDER SCAN AMB NON-IMAGING  Result Value Ref Range   Scan Result 230ml      CLINICAL DATA: Dysuria and hematuria for several months. Antibiotic therapy without relief.  EXAM: CT ABDOMEN AND PELVIS WITHOUT AND WITH CONTRAST  TECHNIQUE: Multidetector CT imaging of the abdomen and pelvis was performed following the standard protocol before and following the bolus administration of  intravenous contrast.  CONTRAST: 133mL OMNIPAQUE IOHEXOL 300 MG/ML SOLN  COMPARISON: CT 04/19/2009  FINDINGS: Lower chest: Lung bases are clear.  Hepatobiliary: The LEFT lobe is diminutive suggesting prior surgery. The multiple low-attenuation cysts along the margin of the LEFT hepatic lobe with simple fluid attenuation. One cystic lesion measuring 4.7 cm has high attenuation (HU equal 34) however this lesion does not enhance from the pre to post-contrast. This is in the same location as a large cyst measuring 9.6 cm present in 2011. These findings likely represent collapse of this large cysts with increased central density. No biliary duct dilatation. Gallbladder is absent.  Pancreas: Pancreas is normal. No ductal dilatation. No pancreatic inflammation.  Spleen: Normal spleen  Adrenals/urinary tract: Adrenal glands are normal. No nephrolithiasis or ureterolithiasis. Vascular calcifications in the  renal hilum. No enhancing renal cortical lesion. Ureters are normal. Delayed pyelogram phase imaging demonstrates no filling defects collecting systems ureter.  There is a diverticulum extending from the posterior LEFT aspect of the bladder measuring 2.2 cm. A second diverticulum extending from the dome of the bladder measuring 5.0 cm. This larger a diverticulum of demonstrates stranding along with serosal surface. Smaller diverticulum along the RIGHT aspect bladder on image 65 series 2.  Stomach/Bowel: Large hiatal hernia similar prior. Multiple diverticula of the ascending, transverse and descending colon. Heavy diverticular disease sigmoid colon. No acute findings. Rectum normal.  Vascular/Lymphatic: Abdominal aorta is normal caliber with atherosclerotic calcification. There is no retroperitoneal or periportal lymphadenopathy. No pelvic lymphadenopathy.  Reproductive: Post hysterectomy.  Other: No free fluid.  Musculoskeletal: There is diffuse sclerosis  within the pelvis and spine likely related to osteoporosis.  IMPRESSION: 1. Large bladder diverticulum with mild stranding associated with the superior diverticulum could represent inflammation. Two additional smaller diverticula. 2. No renal abnormality. 3. Multiple stable benign hepatic cysts and a large partially collapsed high-density nonenhancing cyst in the LEFT hepatic lobe. 4. Large hiatal hernia. 5. Extensive colonic diverticulosis without diverticulitis.   Electronically Signed  By: Suzy Bouchard M.D.  On: 01/21/2015 11:34   Assessment & Plan:  74 year old female with chronic low-grade urinary retention, trabeculated bladder with diverticula, and urethral stenosis status post cysto/ dilation.  CT Urogram with no additional concerning findings.  1. Urinary retention Today, we discussed sequela of chronic urinary retention including recurrent infections, bladder stones, and possibly renal compromise. We discussed double voiding as well as timed voiding which she is agreed to perform regularly. I have offered to teach her how to CIC but she would prefer to defer this at this time. I would like to bring her back in 3 months for repeat cystoscopy to evaluate for possible restenosis of her urethra/possible urethral dilation as well as monitor her renal function and reassess her postvoid residual.  We discussed that in the future, she may need to self catheterize. - Urinalysis, Complete - BLADDER SCAN AMB NON-IMAGING  2. Incomplete bladder emptying As above  3. 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 Bladder diverticulum As above   Return in about 3 months (around 04/25/2015) for cysto/ urethral dilation, BMP.   Patient was advised to return sooner if she developed difficulty urinating or other concerning symptoms.  Hollice Espy, MD  [facility] [address], [place], [place] [address] [phone]

## 2015-01-26 LAB — CULTURE, URINE COMPREHENSIVE

## 2015-01-27 ENCOUNTER — Ambulatory Visit (INDEPENDENT_AMBULATORY_CARE_PROVIDER_SITE_OTHER): Payer: Commercial Managed Care - HMO | Admitting: Family Medicine

## 2015-01-27 ENCOUNTER — Encounter: Payer: Self-pay | Admitting: Family Medicine

## 2015-01-27 ENCOUNTER — Telehealth: Payer: Self-pay

## 2015-01-27 VITALS — BP 148/74 | HR 98 | Temp 98.6°F | Resp 16 | Wt 159.0 lb

## 2015-01-27 DIAGNOSIS — D649 Anemia, unspecified: Secondary | ICD-10-CM | POA: Diagnosis not present

## 2015-01-27 DIAGNOSIS — K219 Gastro-esophageal reflux disease without esophagitis: Secondary | ICD-10-CM

## 2015-01-27 DIAGNOSIS — Q159 Congenital malformation of eye, unspecified: Secondary | ICD-10-CM | POA: Diagnosis not present

## 2015-01-27 DIAGNOSIS — R21 Rash and other nonspecific skin eruption: Secondary | ICD-10-CM | POA: Diagnosis not present

## 2015-01-27 NOTE — Telephone Encounter (Signed)
Spoke with pt who stated she is feeling much better. Reinforced with pt if she develops dysuria, increased frequency, fever, chills, and abd pain to call back. Pt voiced understanding.

## 2015-01-27 NOTE — Progress Notes (Signed)
Patient ID: [name] [name], female   DOB: 05-18-1940, 74 y.o.   MRN: [id number]    Subjective:  HPI  Anemia follow up: Last labs were done in October. Fatigue is stable. Has dizzy spells still off and on for several months. This has been address here before. 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 She was put on Tamsulosin and has follow up with them for cystoscopy. Also she saw dermatologist for her rash on her top abdomen and breast, and back, and her arms. Rash is drying up with the new cream. It does look darker now then before.  Prior to Admission medications   Medication Sig Start Date End Date Taking? Authorizing Provider  aspirin EC 81 MG tablet Take 81 mg by mouth daily.    Historical Provider, MD  ferrous sulfate 325 (65 FE) MG tablet Take 1 tablet (325 mg total) by mouth daily. 12/11/14   Stormee Duda Maceo Pro., MD  fluocinonide ointment (LIDEX) 0.05 %  01/08/15   Historical Provider, MD  lisinopril (PRINIVIL,ZESTRIL) 10 MG tablet Take 1 tablet by mouth daily.    Historical Provider, MD  meloxicam (MOBIC) 7.5 MG tablet Take 1 tablet (7.5 mg total) by mouth 2 (two) times daily as needed (for bursitis.). Patient not taking: Reported on 01/23/2015 08/28/14   Jerrol Banana., MD  Omeprazole 20 MG TBEC Take 1 tablet (20 mg total) by mouth daily as needed (for upset stomach.). Patient not taking: Reported on 01/23/2015 09/02/14   Jerrol Banana., MD  tamsulosin (FLOMAX) 0.4 MG CAPS capsule Take 1 capsule (0.4 mg total) by mouth daily. 11/20/14   Collier Flowers, MD    Patient Active Problem List   Diagnosis Date Noted  . Combined fat and carbohydrate induced hyperlipemia 12/26/2014  . UTI (lower urinary tract  infection) 08/18/2014  . Blurry vision 08/18/2014  . Allergic rhinitis 07/12/2014  . Absolute anemia 07/12/2014  . Baker's cyst of knee 07/12/2014  . Atherosclerosis of coronary artery 07/12/2014  . CAFL (chronic airflow limitation) ([place]) 07/12/2014  . Dizziness 07/12/2014  . Essential (primary) hypertension 07/12/2014  . Acid reflux 07/12/2014  . 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 History of colon polyps 07/12/2014  . Hypercholesteremia 07/12/2014  . Malaise and fatigue 07/12/2014  . Heart attack ([place]) 07/12/2014  . Muscle ache 07/12/2014  . Arthritis, degenerative 07/12/2014  . Peripheral blood vessel disorder ([place]) 07/12/2014  . Breath shortness 07/12/2014  . Bradycardia 08/23/2013  . Arteriosclerosis of coronary artery 08/17/2013  . Diabetes ([place]) 08/17/2013    Past Medical History  Diagnosis Date  . MI (myocardial infarction) ([place])   . Hypertension   . Hyperlipidemia   . GERD (gastroesophageal reflux disease)     Social History   Social History  . Marital Status: Married    Spouse Name: [name]  . Number of Children: 2  . Years of Education: N/A   Occupational History  . retired    Social History Main Topics  . Smoking status: Former Smoker -- 0.25 packs/day for 10 years    Types: Cigarettes    Quit date: 03/06/1998  . Smokeless tobacco: Never Used  . Alcohol Use: No  . Drug Use: No  .  Sexual Activity: Yes    Birth Control/ Protection: Surgical   Other Topics Concern  . Not on file   Social History Narrative    Allergies  Allergen Reactions  . Celebrex  [Celecoxib]     Dark stools.  . Penicillins Other (See Comments)    Childhood reaction  . Prevacid  [Lansoprazole] Diarrhea    Review of Systems  Constitutional: Positive for malaise/fatigue. Negative for fever and chills.  Respiratory: Positive for shortness of breath (at times).   Cardiovascular: Negative.   Genitourinary: Negative for urgency and frequency.  Musculoskeletal: Negative.     Neurological: Positive for dizziness (at times) and weakness. Negative for headaches.  Psychiatric/Behavioral: Negative.     Immunization History  Administered Date(s) Administered  . Pneumococcal Conjugate-13 02/06/2014  . Pneumococcal Polysaccharide-23 07/04/2012  . Tdap 07/04/2012  . Zoster 07/04/2012   Objective:  BP 148/74 mmHg  Pulse 98  Temp(Src) 98.6 F (37 C)  Resp 16  Wt 159 lb (72.122 kg)  Physical Exam  Constitutional: She is oriented to person, place, and time and well-developed, well-nourished, and in no distress.  HENT:  Head: Normocephalic and atraumatic.  Right Ear: External ear normal.  Left Ear: External ear normal.  Nose: Nose normal.  Eyes: Conjunctivae are normal.  Neck: Neck supple.  Cardiovascular: Normal rate, regular rhythm and normal heart sounds.   Pulmonary/Chest: Effort normal and breath sounds normal.  Abdominal: Soft.  Neurological: She is alert and oriented to person, place, and time.  Skin: Skin is warm and dry.  Hyperpigmented spots mainly on trunk.Small--size of hemangiomas.  Psychiatric: Mood, memory, affect and judgment normal.    Lab Results  Component Value Date   WBC 7.1 12/08/2014   HGB 8.6* 12/08/2014   HCT 27.5* 12/08/2014   PLT 250 12/08/2014   GLUCOSE 96 12/08/2014   CHOL 118 04/11/2013   TRIG 91 04/11/2013   HDL 37 04/11/2013   LDLCALC 63 04/11/2013   TSH 1.59 05/22/2014   HGBA1C 5.5 03/22/2012    CMP     Component Value Date/Time   NA 142 12/08/2014 1748   NA 142 09/12/2014 1119   NA 144 12/31/2013 0056   K 4.1 12/08/2014 1748   K 4.1 12/31/2013 0056   CL 109 12/08/2014 1748   CL 108* 12/31/2013 0056   CO2 27 12/08/2014 1748   CO2 30 12/31/2013 0056   GLUCOSE 96 12/08/2014 1748   GLUCOSE 89 09/12/2014 1119   GLUCOSE 101* 12/31/2013 0056   BUN 24* 12/08/2014 1748   BUN 19 09/12/2014 1119   BUN 18 12/31/2013 0056   CREATININE 1.30* 01/21/2015 1001   CREATININE 1.2* 05/22/2014   CREATININE 1.27  01/17/2014 1520   CALCIUM 9.0 12/08/2014 1748   CALCIUM 8.1* 12/31/2013 0056   PROT 8.1 03/21/2012 1953   ALBUMIN 3.7 09/12/2014 1119   ALBUMIN 4.1 03/21/2012 1953   AST 16 07/11/2013   AST 19 03/21/2012 1953   ALT 14 07/11/2013   ALT 18 03/21/2012 1953   ALKPHOS 67 07/11/2013   ALKPHOS 74 03/21/2012 1953   BILITOT 0.4 03/21/2012 1953   GFRNONAA 44* 12/08/2014 1748   GFRNONAA 44* 01/17/2014 1520   GFRNONAA 59* 03/25/2012 0611   GFRAA 51* 12/08/2014 1748   GFRAA 53* 01/17/2014 1520   GFRAA >60 03/25/2012 0611    Assessment and Plan :  1. Anemia, unspecified anemia type F/u now and next OV. - CBC with Differential/Platelet  2. Rash Lichen Planus  3. Gastroesophageal reflux disease,  esophagitis presence not specified   4. Eye abnormality - Ambulatory referral to Ophthalmology 5.HTN Monitor and may need to increase ACEI. Miguel Aschoff MD [facility] 01/27/2015 4:03 PM

## 2015-01-27 NOTE — Telephone Encounter (Signed)
-----   Message from Hollice Espy, MD sent at 01/26/2015 10:17 AM EST ----- Please let this patient know that her urine from clinic looked somewhat suspicious for infection that was obtained in clinic and we sent off a culture which shows that she is likely colonized.  As such, I would prefer not to treat her unless she becomes symptoamtic with dysuria, increased frequency, fevers, chills or abdominal pain.    Please let her know,  Hollice Espy, MD

## 2015-01-28 LAB — CBC WITH DIFFERENTIAL/PLATELET
BASOS: 2 %
Basophils Absolute: 0.1 10*3/uL (ref 0.0–0.2)
EOS (ABSOLUTE): 0.3 10*3/uL (ref 0.0–0.4)
Eos: 4 %
Hematocrit: 30.2 % — ABNORMAL LOW (ref 34.0–46.6)
Hemoglobin: 9.1 g/dL — ABNORMAL LOW (ref 11.1–15.9)
IMMATURE GRANS (ABS): 0 10*3/uL (ref 0.0–0.1)
IMMATURE GRANULOCYTES: 0 %
LYMPHS: 28 %
Lymphocytes Absolute: 1.7 10*3/uL (ref 0.7–3.1)
MCH: 22.2 pg — ABNORMAL LOW (ref 26.6–33.0)
MCHC: 30.1 g/dL — ABNORMAL LOW (ref 31.5–35.7)
MCV: 74 fL — AB (ref 79–97)
Monocytes Absolute: 0.4 10*3/uL (ref 0.1–0.9)
Monocytes: 7 %
NEUTROS PCT: 59 %
Neutrophils Absolute: 3.7 10*3/uL (ref 1.4–7.0)
Platelets: 322 10*3/uL (ref 150–379)
RBC: 4.1 x10E6/uL (ref 3.77–5.28)
RDW: 17.4 % — ABNORMAL HIGH (ref 12.3–15.4)
WBC: 6.3 10*3/uL (ref 3.4–10.8)

## 2015-02-10 ENCOUNTER — Telehealth: Payer: Self-pay | Admitting: Family Medicine

## 2015-02-10 NOTE — Telephone Encounter (Signed)
LOV 01/27/15-per note patient said she has dizzy spells off and on for months and that it has been addressed before. Not having cardiac symptoms. Does she need come back in for appt or try a medication/-aa

## 2015-02-10 NOTE — Telephone Encounter (Signed)
Pt called saying she is still  Having dizzy spells.   Please advise.   [phone]  Thanks Con Memos

## 2015-02-19 NOTE — Telephone Encounter (Signed)
i saw this was still not addressed, please review-aa

## 2015-03-25 DIAGNOSIS — Z5181 Encounter for therapeutic drug level monitoring: Secondary | ICD-10-CM | POA: Diagnosis not present

## 2015-03-25 DIAGNOSIS — L438 Other lichen planus: Secondary | ICD-10-CM | POA: Diagnosis not present

## 2015-03-25 DIAGNOSIS — Z79899 Other long term (current) drug therapy: Secondary | ICD-10-CM | POA: Diagnosis not present

## 2015-03-28 ENCOUNTER — Ambulatory Visit: Payer: Commercial Managed Care - HMO | Admitting: Urology

## 2015-04-25 ENCOUNTER — Telehealth: Payer: Self-pay | Admitting: Urology

## 2015-04-25 ENCOUNTER — Ambulatory Visit (INDEPENDENT_AMBULATORY_CARE_PROVIDER_SITE_OTHER): Payer: PPO | Admitting: Urology

## 2015-04-25 VITALS — BP 156/90 | HR 79 | Ht 65.0 in | Wt 156.4 lb

## 2015-04-25 DIAGNOSIS — N359 Urethral stricture, unspecified: Secondary | ICD-10-CM | POA: Diagnosis not present

## 2015-04-25 DIAGNOSIS — N323 Diverticulum of bladder: Secondary | ICD-10-CM

## 2015-04-25 DIAGNOSIS — N3592 Unspecified urethral stricture, female: Secondary | ICD-10-CM

## 2015-04-25 DIAGNOSIS — N390 Urinary tract infection, site not specified: Secondary | ICD-10-CM | POA: Diagnosis not present

## 2015-04-25 DIAGNOSIS — R339 Retention of urine, unspecified: Secondary | ICD-10-CM

## 2015-04-25 LAB — URINALYSIS, COMPLETE
Bilirubin, UA: NEGATIVE
GLUCOSE, UA: NEGATIVE
KETONES UA: NEGATIVE
NITRITE UA: POSITIVE — AB
SPEC GRAV UA: 1.025 (ref 1.005–1.030)
UUROB: 0.2 mg/dL (ref 0.2–1.0)
pH, UA: 6 (ref 5.0–7.5)

## 2015-04-25 LAB — MICROSCOPIC EXAMINATION
Epithelial Cells (non renal): >10 /hpf — ABNORMAL HIGH (ref 0–10)
WBC, UA: >30 /hpf — ABNORMAL HIGH (ref 0–?)

## 2015-04-25 MED ORDER — CIPROFLOXACIN HCL 500 MG PO TABS
500.0000 mg | ORAL_TABLET | Freq: Once | ORAL | Status: AC
  Administered 2015-04-25: 500 mg via ORAL

## 2015-04-25 MED ORDER — CIPROFLOXACIN HCL 500 MG PO TABS: 500.0000 mg | ORAL_TABLET | Freq: Two times a day (BID) | ORAL | Status: DC

## 2015-04-25 MED ORDER — LIDOCAINE HCL 2 % EX GEL
1.0000 "application " | Freq: Once | CUTANEOUS | Status: AC
  Administered 2015-04-25: 1 via URETHRAL

## 2015-04-25 MED ORDER — CLOBETASOL PROP EMOLLIENT BASE 0.05 % EX CREA: 1.0000 g | TOPICAL_CREAM | Freq: Every day | CUTANEOUS | Status: DC

## 2015-04-25 MED ORDER — ESTROGENS, CONJUGATED 0.625 MG/GM VA CREA: 1.0000 g | TOPICAL_CREAM | Freq: Every day | VAGINAL | Status: DC

## 2015-04-25 NOTE — Telephone Encounter (Signed)
Pt left a mail order pharmacy name and number for prescription  [facility] [phone]

## 2015-04-25 NOTE — Progress Notes (Signed)
4:19 PM  04/25/15  [name] [name] Jun 11, 1940 [id number]  Referring provider: Jerrol Banana., MD [address] [place], [place] [address]  Chief Complaint  Patient presents with  . Cysto    HPI: 75 year old woman a history of dysfunctional voiding/ chronic urinary retention/ urethral stricture.  Her PVRs have been as high as 300 cc. She also has a history of microscopic hematuria as Escherichia coli UTIs.    History of microsocpic hematuria S/p work up 12/2014 with CT Urogram (bladder diverticula x 3 noted) and cystoscopy revealing urethral sticture s/p dilation  Dysfunctional voiding/ chronic urinary retention On Flomax.  Elevated PVRs to 300 Stream improved after last dilation, minimal voiding complaints today Tries to double void  Urethral stricture S/p dilation on 12/2014- presents today for repeat cysto/ possible dilation  Recurrent UTIs hsitory of E. Coli UTIs None since last visit    PMH: Past Medical History  Diagnosis Date  . MI (myocardial infarction) ([place])   . Hypertension   . Hyperlipidemia   . GERD (gastroesophageal reflux disease)     Surgical History: Past Surgical History  Procedure Laterality Date  . Appendectomy    . Abdominal hysterectomy    . Cystoscopy    . Tonsillectomy    . Coronary angioplasty with stent placement      Home Medications:    Medication List       This list is accurate as of: 04/25/15 11:59 PM.  Always use your most recent med list.               aspirin EC 81 MG tablet  Take 81 mg by mouth daily.     ciprofloxacin 500 MG tablet  Commonly known as:  CIPRO  Take 1 tablet (500 mg total) by mouth every 12 (twelve) hours.     Clobetasol Prop Emollient Base 0.05 % emollient cream  Commonly known as:  CLOBETASOL PROPIONATE E  Apply 1 g topically daily. Per vagina/ urethra     conjugated estrogens vaginal cream  Commonly known as:  PREMARIN  Place 0.5 Applicatorfuls vaginally daily. Pea  sized amount per urethral M_W_Fr     ferrous sulfate 325 (65 FE) MG tablet  Take 1 tablet (325 mg total) by mouth daily.     fluocinonide ointment 0.05 %  Commonly known as:  LIDEX     folic acid 1 MG tablet  Commonly known as:  FOLVITE  Reported on 04/25/2015     lisinopril 10 MG tablet  Commonly known as:  PRINIVIL,ZESTRIL  Take 1 tablet by mouth daily.     meloxicam 7.5 MG tablet  Commonly known as:  MOBIC  Take 1 tablet (7.5 mg total) by mouth 2 (two) times daily as needed (for bursitis.).     methotrexate 2.5 MG tablet  Commonly known as:  RHEUMATREX  Reported on 04/25/2015     Omeprazole 20 MG Tbec  Take 1 tablet (20 mg total) by mouth daily as needed (for upset stomach.).     tamsulosin 0.4 MG Caps capsule  Commonly known as:  FLOMAX  Take 1 capsule (0.4 mg total) by mouth daily.        Allergies:  Allergies  Allergen Reactions  . Celebrex  [Celecoxib]     Dark stools.  . Penicillins Other (See Comments)    Childhood reaction  . Prevacid  [Lansoprazole] Diarrhea    Family History: Family History  Problem Relation Age of Onset  . Hyperlipidemia Mother   .  Allergies Mother   . Cerebral aneurysm Mother     cause of death at age 1  . Heart disease Father     Fatal MI ag 35  . Cancer Brother     lung cancer  . Hyperlipidemia Brother   . Colonic polyp Brother   . Healthy Son   . Cancer Brother   . Healthy Son     Social History:  reports that she quit smoking about 17 years ago. Her smoking use included Cigarettes. She has a 2.5 pack-year smoking history. She has never used smokeless tobacco. She reports that she does not drink alcohol or use illicit drugs.   Physical Exam: BP 156/90 mmHg  Pulse 79  Ht 5\' 5"  (1.651 m)  Wt 156 lb 6.4 oz (70.943 kg)  BMI 26.03 kg/m2  Constitutional:  Alert and oriented, No acute distress. HEENT: [place] AT, moist mucus membranes.  Trachea midline, no masses. Cardiovascular: No clubbing, cyanosis, or  edema. Respiratory: Normal respiratory effort, no increased work of breathing..  GU: Significant atrophic vaginitis with narrow introus and stenotic urethral meatus.   Skin: No rashes, bruises or suspicious lesions. Neurologic: Grossly intact, no focal deficits, moving all 4 extremities. Psychiatric: Normal mood and affect.  Laboratory Data: Lab Results  Component Value Date   WBC 5.1 05/01/2015   HGB 8.6* 12/08/2014   HCT 34.5 05/01/2015   MCV 88 05/01/2015   PLT 246 05/01/2015    Lab Results  Component Value Date   CREATININE 1.30* 01/21/2015    Lab Results  Component Value Date   HGBA1C 5.8* 05/01/2015    UA Results for orders placed or performed in visit on 04/25/15  Microscopic Examination  Result Value Ref Range   WBC, UA >30 (H) 0 -  5 /hpf   RBC, UA 3-10 (A) 0 -  2 /hpf   Epithelial Cells (non renal) >10 (H) 0 - 10 /hpf   Bacteria, UA Many (A) None seen/Few  Urinalysis, Complete  Result Value Ref Range   Specific Gravity, UA 1.025 1.005 - 1.030   pH, UA 6.0 5.0 - 7.5   Color, UA Yellow Yellow   Appearance Ur Cloudy (A) Clear   Leukocytes, UA 2+ (A) Negative   Protein, UA 1+ (A) Negative/Trace   Glucose, UA Negative Negative   Ketones, UA Negative Negative   RBC, UA 3+ (A) Negative   Bilirubin, UA Negative Negative   Urobilinogen, Ur 0.2 0.2 - 1.0 mg/dL   Nitrite, UA Positive (A) Negative   Microscopic Examination See below:      Cystoscopy Procedure Note  Patient identification was confirmed, informed consent was obtained, and patient was prepped using Betadine solution.  Lidocaine jelly was administered per urethral meatus.    Preoperative abx where received prior to procedure.    Procedure: - Flexible cystoscope attempted to be introduced, 16 Fr but unable due to significant urethral stenosis  Serial dilations used up to 14 Fr to dilate urethral but unable to advance beyond this diameter due to resistance and patient discomfort.   Marland Kitchen  Post-Procedure: - Patient tolerated the procedure well\  Assessment & Plan:  75 year old female with chronic low-grade urinary retention, trabeculated bladder with diverticula, and urethral stenosis status post cysto/ dilation.  Unable to adequately dilation urethral today due to resistant, discomfort.  Exam concerning for ? Lichens sclerosis. Vs. Atrophic vaginitis  1. 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 Attempt at addressing possible underlying issue with Estrogen cream (M-W-Fr) and few weeks of steroid cream if stenosis ( be be beneficial if stenosis related to lichens sclerosis or other inflammatory/ scaring condition.)  She would like to give this a try.  Scripts sent to pharmacy.   Plan to return in 3 months for repeat cysto/ dilation to see if any effect - Urinalysis, Complete - ciprofloxacin (CIPRO) tablet 500 mg; Take 1 tablet (500 mg total) by mouth once. - lidocaine (XYLOCAINE) 2 % jelly 1 application; Place 1 application into the urethra once.  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 Dysfunctional voiding vs. Urethral stricture Continue flomax  3. Incomplete bladder emptying History of elevated PVRs to 300, Cr 1.3, and UTIs.   Will continue to follow sequela closely, if not improved with above and repeat dilation, she may need to learn CIC  4. 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   Patient was advised to return sooner if she developed difficulty urinating or other concerning symptoms.  Hollice Espy, MD  [facility] [address], [place], [place] [address] [phone]

## 2015-04-28 MED ORDER — ESTROGENS, CONJUGATED 0.625 MG/GM VA CREA: 1.0000 g | TOPICAL_CREAM | Freq: Every day | VAGINAL | Status: DC

## 2015-04-28 MED ORDER — CLOBETASOL PROP EMOLLIENT BASE 0.05 % EX CREA: 1.0000 g | TOPICAL_CREAM | Freq: Every day | CUTANEOUS | Status: DC

## 2015-04-28 NOTE — Telephone Encounter (Signed)
Spoke with patient and explained that scripts for creams were sent to [facility] mail order pharm but would send new scripts to her new mail order pharm.

## 2015-04-29 ENCOUNTER — Other Ambulatory Visit: Payer: Self-pay

## 2015-04-29 DIAGNOSIS — N3592 Unspecified urethral stricture, female: Secondary | ICD-10-CM

## 2015-04-29 MED ORDER — ESTROGENS, CONJUGATED 0.625 MG/GM VA CREA
TOPICAL_CREAM | VAGINAL | Status: DC
Start: 2015-04-29 — End: 2016-03-29

## 2015-04-29 NOTE — Progress Notes (Signed)
[facility] pharmacy requested clarification of medication application. Used free text and wrote in M-W-F pea sized amount.

## 2015-04-30 ENCOUNTER — Ambulatory Visit (INDEPENDENT_AMBULATORY_CARE_PROVIDER_SITE_OTHER): Payer: PPO | Admitting: Family Medicine

## 2015-04-30 VITALS — BP 140/72 | HR 64 | Temp 98.3°F | Resp 12 | Wt 157.0 lb

## 2015-04-30 DIAGNOSIS — D649 Anemia, unspecified: Secondary | ICD-10-CM

## 2015-04-30 DIAGNOSIS — E7800 Pure hypercholesterolemia, unspecified: Secondary | ICD-10-CM | POA: Diagnosis not present

## 2015-04-30 DIAGNOSIS — N289 Disorder of kidney and ureter, unspecified: Secondary | ICD-10-CM | POA: Diagnosis not present

## 2015-04-30 DIAGNOSIS — I10 Essential (primary) hypertension: Secondary | ICD-10-CM

## 2015-04-30 DIAGNOSIS — R739 Hyperglycemia, unspecified: Secondary | ICD-10-CM

## 2015-04-30 DIAGNOSIS — R21 Rash and other nonspecific skin eruption: Secondary | ICD-10-CM | POA: Diagnosis not present

## 2015-04-30 NOTE — Progress Notes (Signed)
Patient ID: [name] [name], female   DOB: 01-13-1941, 75 y.o.   MRN: [id number]    Subjective:  HPI  Patient is here for 3 months follow up.  Anemia: Last CBC check was in November and CBC needs to be repeated today. Patient states her energy level is better, not having cardiac issues, no dizziness. Lab Results  Component Value Date   WBC 6.3 01/27/2015   HGB 8.6* 12/08/2014   HCT 30.2* 01/27/2015   MCV 74* 01/27/2015   PLT 322 01/27/2015  ] Hypertension: b/p was elevated on the last visit. No changes were made and we were to follow for now. Patient has been checking her b/p and readings have been around 130s/70s-60s. BP Readings from Last 3 Encounters:  04/30/15 140/72  04/25/15 156/90  01/27/15 148/74   Rash: patient has had an issue with rash all over her arms that started a few months ago and we tried her on a cream and did not help so we referred her to dermatologist. She has seen dermatologist and is trying medication. They are not sure what patient has and mentioned to the patient that maybe medication she is taking giving her this reaction.  Prior to Admission medications   Medication Sig Start Date End Date Taking? Authorizing Provider  aspirin EC 81 MG tablet Take 81 mg by mouth daily.   Yes Historical Provider, MD  ciprofloxacin (CIPRO) 500 MG tablet Take 1 tablet (500 mg total) by mouth every 12 (twelve) hours. 04/25/15  Yes Hollice Espy, MD  Clobetasol Prop Emollient Base (CLOBETASOL PROPIONATE E) 0.05 % emollient cream Apply 1 g topically daily. Per vagina/ urethra 04/28/15  Yes Hollice Espy, MD  conjugated estrogens (PREMARIN) vaginal cream Pea sized amount per urethral M_W_Fr 04/29/15  Yes Hollice Espy, MD  ferrous sulfate 325 (65 FE) MG tablet Take 1 tablet (325 mg total) by mouth daily. 12/11/14  Yes Khia Dieterich Maceo Pro., MD  fluocinonide ointment (LIDEX) 0.05 %  01/08/15  Yes Historical Provider, MD  folic acid (FOLVITE) 1 MG tablet Reported on 04/25/2015  03/27/15  Yes Historical Provider, MD  lisinopril (PRINIVIL,ZESTRIL) 10 MG tablet Take 1 tablet by mouth daily.   Yes Historical Provider, MD  meloxicam (MOBIC) 7.5 MG tablet Take 1 tablet (7.5 mg total) by mouth 2 (two) times daily as needed (for bursitis.). 08/28/14  Yes Jameika Kinn Maceo Pro., MD  methotrexate [facility]) 2.5 MG tablet Reported on 04/25/2015 03/27/15  Yes Historical Provider, MD  Omeprazole 20 MG TBEC Take 1 tablet (20 mg total) by mouth daily as needed (for upset stomach.). 09/02/14  Yes Jerrol Banana., MD  tamsulosin (FLOMAX) 0.4 MG CAPS capsule Take 1 capsule (0.4 mg total) by mouth daily. 11/20/14  Yes Collier Flowers, MD    Patient Active Problem List   Diagnosis Date Noted  . Combined fat and carbohydrate induced hyperlipemia 12/26/2014  . UTI (lower urinary tract infection) 08/18/2014  . Blurry vision 08/18/2014  . Allergic rhinitis 07/12/2014  . Absolute anemia 07/12/2014  . Baker's cyst of knee 07/12/2014  . Atherosclerosis of coronary artery 07/12/2014  . CAFL (chronic airflow limitation) ([place]) 07/12/2014  . Dizziness 07/12/2014  . Essential (primary) hypertension 07/12/2014  . Acid reflux 07/12/2014  . 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 History of colon polyps 07/12/2014  . Hypercholesteremia 07/12/2014  . Malaise and fatigue 07/12/2014  . Heart attack ([place]) 07/12/2014  . Muscle ache 07/12/2014  . Arthritis, degenerative 07/12/2014  . Peripheral blood vessel disorder (  [place]) 07/12/2014  . Breath shortness 07/12/2014  . Bradycardia 08/23/2013  . Arteriosclerosis of coronary artery 08/17/2013  . Diabetes ([place]) 08/17/2013  . Diabetes mellitus ([place]) 08/17/2013  . Peripheral vascular disease ([place]) 08/17/2013    Past Medical History  Diagnosis Date  . MI (myocardial infarction) ([place])   . Hypertension   . Hyperlipidemia   . GERD (gastroesophageal reflux disease)     Social History   Social History  . Marital Status: Married    Spouse Name: [name]    . Number of Children: 2  . Years of Education: N/A   Occupational History  . retired    Social History Main Topics  . Smoking status: Former Smoker -- 0.25 packs/day for 10 years    Types: Cigarettes    Quit date: 03/06/1998  . Smokeless tobacco: Never Used  . Alcohol Use: No  . Drug Use: No  . Sexual Activity: Yes    Birth Control/ Protection: Surgical   Other Topics Concern  . Not on file   Social History Narrative    Allergies  Allergen Reactions  . Celebrex  [Celecoxib]     Dark stools.  . Penicillins Other (See Comments)    Childhood reaction  . Prevacid  [Lansoprazole] Diarrhea    Review of Systems  Constitutional: Negative.   Respiratory: Negative.   Cardiovascular: Negative.   Musculoskeletal: Positive for joint pain. Negative for myalgias, back pain and neck pain.  Neurological: Negative.     Immunization History  Administered Date(s) Administered  . Pneumococcal Conjugate-13 02/06/2014  . Pneumococcal Polysaccharide-23 07/04/2012  . Tdap 07/04/2012  . Zoster 07/04/2012   Objective:  BP 140/72 mmHg  Pulse 64  Temp(Src) 98.3 F (36.8 C)  Resp 12  Wt 157 lb (71.215 kg)  Physical Exam  Constitutional: She is oriented to person, place, and time and well-developed, well-nourished, and in no distress.  HENT:  Head: Normocephalic and atraumatic.  Eyes: Conjunctivae are normal. Pupils are equal, round, and reactive to light.  Neck: Normal range of motion. Neck supple.  Cardiovascular: Normal rate, regular rhythm, normal heart sounds and intact distal pulses.   No murmur heard. Pulmonary/Chest: Effort normal and breath sounds normal. No respiratory distress. She has no wheezes.  Musculoskeletal: She exhibits no edema or tenderness.  Neurological: She is alert and oriented to person, place, and time. Gait normal.  Psychiatric: Mood, memory, affect and judgment normal.    Lab Results  Component Value Date   WBC 6.3 01/27/2015   HGB 8.6*  12/08/2014   HCT 30.2* 01/27/2015   PLT 322 01/27/2015   GLUCOSE 96 12/08/2014   CHOL 118 04/11/2013   TRIG 91 04/11/2013   HDL 37 04/11/2013   LDLCALC 63 04/11/2013   TSH 1.59 05/22/2014   HGBA1C 5.5 03/22/2012    CMP     Component Value Date/Time   NA 142 12/08/2014 1748   NA 142 09/12/2014 1119   NA 144 12/31/2013 0056   K 4.1 12/08/2014 1748   K 4.1 12/31/2013 0056   CL 109 12/08/2014 1748   CL 108* 12/31/2013 0056   CO2 27 12/08/2014 1748   CO2 30 12/31/2013 0056   GLUCOSE 96 12/08/2014 1748   GLUCOSE 89 09/12/2014 1119   GLUCOSE 101* 12/31/2013 0056   BUN 24* 12/08/2014 1748   BUN 19 09/12/2014 1119   BUN 18 12/31/2013 0056   CREATININE 1.30* 01/21/2015 1001   CREATININE 1.2* 05/22/2014   CREATININE 1.27 01/17/2014 1520   CALCIUM  9.0 12/08/2014 1748   CALCIUM 8.1* 12/31/2013 0056   PROT 8.1 03/21/2012 1953   ALBUMIN 3.7 09/12/2014 1119   ALBUMIN 4.1 03/21/2012 1953   AST 16 07/11/2013   AST 19 03/21/2012 1953   ALT 14 07/11/2013   ALT 18 03/21/2012 1953   ALKPHOS 67 07/11/2013   ALKPHOS 74 03/21/2012 1953   BILITOT 0.4 03/21/2012 1953   GFRNONAA 44* 12/08/2014 1748   GFRNONAA 44* 01/17/2014 1520   GFRNONAA 59* 03/25/2012 0611   GFRAA 51* 12/08/2014 1748   GFRAA 53* 01/17/2014 1520   GFRAA >60 03/25/2012 0611    Assessment and Plan :  1. Anemia, unspecified anemia type Stable. Will check labs today to check for stability.Asian has had complete GI and hematology workup. May need referral back to hematology if this does not improve. - CBC with Differential/Platelet - Iron  2. Rash Not better. Following dermatologist. I am not sure the etiology for the rash patient has.  3. Renal insufficiency  4. Essential (primary) hypertension Stable/better. No changes in regimen needed at this time. Follow. I have done the exam and reviewed the above chart and it is accurate to the best of my knowledge.  Patient was seen and examined by Dr. Eulas Post  and note was scribed by Theressa Millard, RMA.    Miguel Aschoff MD [facility] 04/30/2015 11:27 AM

## 2015-05-01 DIAGNOSIS — E7800 Pure hypercholesterolemia, unspecified: Secondary | ICD-10-CM | POA: Diagnosis not present

## 2015-05-01 DIAGNOSIS — R739 Hyperglycemia, unspecified: Secondary | ICD-10-CM | POA: Diagnosis not present

## 2015-05-02 ENCOUNTER — Other Ambulatory Visit: Payer: Self-pay

## 2015-05-02 LAB — HEMOGLOBIN A1C
Est. average glucose Bld gHb Est-mCnc: 120 mg/dL
Hgb A1c MFr Bld: 5.8 % — ABNORMAL HIGH (ref 4.8–5.6)

## 2015-05-02 LAB — CBC WITH DIFFERENTIAL/PLATELET
BASOS: 1 %
Basophils Absolute: 0 10*3/uL (ref 0.0–0.2)
EOS (ABSOLUTE): 0.3 10*3/uL (ref 0.0–0.4)
EOS: 5 %
HEMATOCRIT: 34.5 % (ref 34.0–46.6)
HEMOGLOBIN: 10.9 g/dL — AB (ref 11.1–15.9)
IMMATURE GRANULOCYTES: 0 %
Immature Grans (Abs): 0 10*3/uL (ref 0.0–0.1)
LYMPHS ABS: 1.3 10*3/uL (ref 0.7–3.1)
Lymphs: 26 %
MCH: 27.9 pg (ref 26.6–33.0)
MCHC: 31.6 g/dL (ref 31.5–35.7)
MCV: 88 fL (ref 79–97)
MONOCYTES: 7 %
MONOS ABS: 0.4 10*3/uL (ref 0.1–0.9)
NEUTROS PCT: 61 %
Neutrophils Absolute: 3.1 10*3/uL (ref 1.4–7.0)
Platelets: 246 10*3/uL (ref 150–379)
RBC: 3.91 x10E6/uL (ref 3.77–5.28)
RDW: 20.6 % — AB (ref 12.3–15.4)
WBC: 5.1 10*3/uL (ref 3.4–10.8)

## 2015-05-02 LAB — LIPID PANEL WITH LDL/HDL RATIO
CHOLESTEROL TOTAL: 193 mg/dL (ref 100–199)
HDL: 39 mg/dL — AB (ref >39)
LDL CALC: 126 mg/dL — AB (ref 0–99)
LDL/HDL RATIO: 3.2 ratio (ref 0.0–3.2)
TRIGLYCERIDES: 141 mg/dL (ref 0–149)
VLDL Cholesterol Cal: 28 mg/dL (ref 5–40)

## 2015-05-05 ENCOUNTER — Encounter: Payer: Self-pay | Admitting: Urology

## 2015-05-05 ENCOUNTER — Telehealth: Payer: Self-pay

## 2015-05-05 NOTE — Telephone Encounter (Signed)
Left message to call back  

## 2015-05-05 NOTE — Telephone Encounter (Signed)
-----   Message from Jerrol Banana., MD sent at 05/04/2015 12:16 PM EST ----- CBC stable.

## 2015-05-05 NOTE — Telephone Encounter (Signed)
Advised patient of results.  

## 2015-05-06 ENCOUNTER — Telehealth: Payer: Self-pay

## 2015-05-06 NOTE — Telephone Encounter (Signed)
Patient was not seen.

## 2015-05-13 ENCOUNTER — Telehealth: Payer: Self-pay

## 2015-05-13 NOTE — Telephone Encounter (Signed)
Lets take a break from abx.  She is chronically colonized.    Hollice Espy, MD

## 2015-05-13 NOTE — Telephone Encounter (Signed)
See previous notes.

## 2015-05-13 NOTE — Telephone Encounter (Signed)
Pt called requesting a refill of trimethoprim. Dr.Hart previously put pt on medication. I didn't see in your dictation to continue medication. Please advise.

## 2015-05-13 NOTE — Telephone Encounter (Signed)
Spoke with pt in reference to abx. Pt voiced understanding.  

## 2015-05-15 ENCOUNTER — Other Ambulatory Visit: Payer: Self-pay | Admitting: Family Medicine

## 2015-05-15 DIAGNOSIS — M791 Myalgia, unspecified site: Secondary | ICD-10-CM

## 2015-05-15 NOTE — Telephone Encounter (Signed)
[name] [name] patient:  Last refill 08/28/2014 with 6 add'l refills  Last OV: 04/30/2015

## 2015-05-15 NOTE — Telephone Encounter (Signed)
Pt contacted office for refill request on the following medications:  meloxicam (MOBIC) 7.5 MG tablet. 90 day supply.   [facility] order.  CB#[phone]/MW

## 2015-05-16 MED ORDER — MELOXICAM 7.5 MG PO TABS: 7.5000 mg | ORAL_TABLET | Freq: Two times a day (BID) | ORAL | Status: DC | PRN

## 2015-05-16 NOTE — Addendum Note (Signed)
Addended by: Jerrell Belfast on: 05/16/2015 08:39 AM   Modules accepted: Orders

## 2015-05-16 NOTE — Telephone Encounter (Signed)
Sent rx. Needs to set up follow up with Dr. Rosanna Randy in June. Thanks.

## 2015-05-16 NOTE — Telephone Encounter (Signed)
Please review. Dr. Marlan Palau patient, thank you-aa

## 2015-05-20 ENCOUNTER — Telehealth: Payer: Self-pay | Admitting: Family Medicine

## 2015-05-20 NOTE — Telephone Encounter (Signed)
Marqulia with [facility] called to request an approval for 180 tablets with a 90 day supply of meloxicam (MOBIC) 7.5 MG.  [id number]

## 2015-05-21 ENCOUNTER — Other Ambulatory Visit: Payer: Self-pay | Admitting: Family Medicine

## 2015-05-21 NOTE — Telephone Encounter (Signed)
Ok for now

## 2015-05-22 DIAGNOSIS — Z79899 Other long term (current) drug therapy: Secondary | ICD-10-CM | POA: Diagnosis not present

## 2015-05-22 DIAGNOSIS — L438 Other lichen planus: Secondary | ICD-10-CM | POA: Diagnosis not present

## 2015-05-22 MED ORDER — MELOXICAM 7.5 MG PO TABS: 7.5000 mg | ORAL_TABLET | Freq: Two times a day (BID) | ORAL | Status: DC

## 2015-05-22 NOTE — Telephone Encounter (Signed)
sent 

## 2015-05-22 NOTE — Addendum Note (Signed)
Addended by: Luvenia Heller on: 05/22/2015 09:08 AM   Modules accepted: Orders

## 2015-06-26 DIAGNOSIS — E782 Mixed hyperlipidemia: Secondary | ICD-10-CM | POA: Diagnosis not present

## 2015-06-26 DIAGNOSIS — I10 Essential (primary) hypertension: Secondary | ICD-10-CM | POA: Diagnosis not present

## 2015-06-26 DIAGNOSIS — I25118 Atherosclerotic heart disease of native coronary artery with other forms of angina pectoris: Secondary | ICD-10-CM | POA: Diagnosis not present

## 2015-06-26 DIAGNOSIS — R0602 Shortness of breath: Secondary | ICD-10-CM | POA: Diagnosis not present

## 2015-07-18 DIAGNOSIS — R0602 Shortness of breath: Secondary | ICD-10-CM | POA: Diagnosis not present

## 2015-07-18 DIAGNOSIS — I25118 Atherosclerotic heart disease of native coronary artery with other forms of angina pectoris: Secondary | ICD-10-CM | POA: Diagnosis not present

## 2015-07-22 DIAGNOSIS — I340 Nonrheumatic mitral (valve) insufficiency: Secondary | ICD-10-CM | POA: Insufficient documentation

## 2015-07-22 DIAGNOSIS — I739 Peripheral vascular disease, unspecified: Secondary | ICD-10-CM | POA: Diagnosis not present

## 2015-07-22 DIAGNOSIS — I2510 Atherosclerotic heart disease of native coronary artery without angina pectoris: Secondary | ICD-10-CM | POA: Diagnosis not present

## 2015-07-22 DIAGNOSIS — I10 Essential (primary) hypertension: Secondary | ICD-10-CM | POA: Diagnosis not present

## 2015-07-22 DIAGNOSIS — E782 Mixed hyperlipidemia: Secondary | ICD-10-CM | POA: Diagnosis not present

## 2015-07-30 ENCOUNTER — Other Ambulatory Visit: Payer: PPO | Admitting: Urology

## 2015-08-05 ENCOUNTER — Encounter: Payer: Self-pay | Admitting: Family Medicine

## 2015-08-05 ENCOUNTER — Ambulatory Visit (INDEPENDENT_AMBULATORY_CARE_PROVIDER_SITE_OTHER): Payer: PPO | Admitting: Family Medicine

## 2015-08-05 VITALS — BP 148/78 | HR 68 | Temp 97.5°F | Resp 16 | Wt 156.0 lb

## 2015-08-05 DIAGNOSIS — E785 Hyperlipidemia, unspecified: Secondary | ICD-10-CM | POA: Diagnosis not present

## 2015-08-05 DIAGNOSIS — I10 Essential (primary) hypertension: Secondary | ICD-10-CM | POA: Diagnosis not present

## 2015-08-05 DIAGNOSIS — N3001 Acute cystitis with hematuria: Secondary | ICD-10-CM | POA: Diagnosis not present

## 2015-08-05 DIAGNOSIS — R300 Dysuria: Secondary | ICD-10-CM | POA: Diagnosis not present

## 2015-08-05 DIAGNOSIS — D649 Anemia, unspecified: Secondary | ICD-10-CM

## 2015-08-05 LAB — POCT URINALYSIS DIPSTICK
Bilirubin, UA: NEGATIVE
GLUCOSE UA: NEGATIVE
KETONES UA: NEGATIVE
Nitrite, UA: POSITIVE
Protein, UA: NEGATIVE
SPEC GRAV UA: 1.02
UROBILINOGEN UA: 0.2
pH, UA: 6.5

## 2015-08-05 MED ORDER — NITROFURANTOIN MONOHYD MACRO 100 MG PO CAPS: 100.0000 mg | ORAL_CAPSULE | Freq: Two times a day (BID) | ORAL | Status: DC

## 2015-08-05 NOTE — Progress Notes (Signed)
Patient ID: [name] [name], female   DOB: 13-Nov-1940, 75 y.o.   MRN: [id number]    Subjective:  HPI  Hypertension, follow-up:  BP Readings from Last 3 Encounters:  08/05/15 148/78  04/30/15 140/72  04/25/15 156/90    She was last seen for hypertension 3 months ago.  BP at that visit was 140/72. Management since that visit includes none. She reports good compliance with treatment. She is not having side effects.  She is not exercising, regularly. She is adherent to low salt diet.   Outside blood pressures are 140's/60's . She is experiencing none.  Patient denies chest pain, chest pressure/discomfort, claudication, dyspnea, exertional chest pressure/discomfort, fatigue, irregular heart beat, lower extremity edema, near-syncope, orthopnea, palpitations and paroxysmal nocturnal dyspnea.   Cardiovascular risk factors include advanced age (older than 89 for men, 97 for women), dyslipidemia and hypertension.   Wt Readings from Last 3 Encounters:  08/05/15 156 lb (70.761 kg)  04/30/15 157 lb (71.215 kg)  04/25/15 156 lb 6.4 oz (70.943 kg)   ------------------------------------------------------------------------  Anemia-  Pt here for a 3 month follow up. Her last CBC was on 05/01/15 and hemoglobin was 10.9.   She has had dysuria for past couple of days. No fevers. Since LOV, pt had had full cardiac work up for chest pain, and all test came back ok, per pt    Prior to Admission medications   Medication Sig Start Date End Date Taking? Authorizing Provider  aspirin EC 81 MG tablet Take 81 mg by mouth daily.   Yes Historical Provider, MD  atorvastatin (LIPITOR) 40 MG tablet Take by mouth. 06/26/15 06/25/16 Yes Historical Provider, MD  Clobetasol Prop Emollient Base (CLOBETASOL PROPIONATE E) 0.05 % emollient cream Apply 1 g topically daily. Per vagina/ urethra 04/28/15  Yes Hollice Espy, MD  conjugated estrogens (PREMARIN) vaginal cream Pea sized amount per urethral M_W_Fr 04/29/15  Yes  Hollice Espy, MD  ferrous sulfate 325 (65 FE) MG tablet Take 1 tablet (325 mg total) by mouth daily. 12/11/14  Yes Richard Maceo Pro., MD  fluocinonide ointment (LIDEX) 0.05 %  01/08/15  Yes Historical Provider, MD  folic acid (FOLVITE) 1 MG tablet Reported on 04/25/2015 03/27/15  Yes Historical Provider, MD  lisinopril (PRINIVIL,ZESTRIL) 10 MG tablet Take 1 tablet by mouth daily.   Yes Historical Provider, MD  meloxicam (MOBIC) 7.5 MG tablet TAKE 1 TABLET BY MOUTH 2 TIMES A DAY AS NEEDED 05/21/15  Yes Richard Maceo Pro., MD  meloxicam (MOBIC) 7.5 MG tablet Take 1 tablet (7.5 mg total) by mouth 2 (two) times daily. 05/22/15  Yes Richard Maceo Pro., MD  methotrexate [facility]) 2.5 MG tablet Reported on 04/25/2015 03/27/15  Yes Historical Provider, MD  Omeprazole 20 MG TBEC Take 1 tablet (20 mg total) by mouth daily as needed (for upset stomach.). 09/02/14  Yes Jerrol Banana., MD  ciprofloxacin (CIPRO) 500 MG tablet Take 1 tablet (500 mg total) by mouth every 12 (twelve) hours. Patient not taking: Reported on 08/05/2015 04/25/15   Hollice Espy, MD    Patient Active Problem List   Diagnosis Date Noted  . MI (mitral incompetence) 07/22/2015  . Combined fat and carbohydrate induced hyperlipemia 12/26/2014  . UTI (lower urinary tract infection) 08/18/2014  . Blurry vision 08/18/2014  . Allergic rhinitis 07/12/2014  . Absolute anemia 07/12/2014  . Baker's cyst of knee 07/12/2014  . Atherosclerosis of coronary artery 07/12/2014  . CAFL (chronic airflow limitation) ([place]) 07/12/2014  . Dizziness 07/12/2014  .  Essential (primary) hypertension 07/12/2014  . Acid reflux 07/12/2014  . 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 History of colon polyps 07/12/2014  . Hypercholesteremia 07/12/2014  . Malaise and fatigue 07/12/2014  . Heart attack ([place]) 07/12/2014  . Muscle ache 07/12/2014  . Arthritis, degenerative 07/12/2014  . Peripheral blood vessel disorder ([place]) 07/12/2014  . Breath shortness  07/12/2014  . Bradycardia 08/23/2013  . Arteriosclerosis of coronary artery 08/17/2013  . Diabetes ([place]) 08/17/2013  . Diabetes mellitus ([place]) 08/17/2013  . Peripheral vascular disease ([place]) 08/17/2013    Past Medical History  Diagnosis Date  . MI (myocardial infarction) ([place])   . Hypertension   . Hyperlipidemia   . GERD (gastroesophageal reflux disease)     Social History   Social History  . Marital Status: Married    Spouse Name: [name]  . Number of Children: 2  . Years of Education: N/A   Occupational History  . retired    Social History Main Topics  . Smoking status: Former Smoker -- 0.25 packs/day for 10 years    Types: Cigarettes    Quit date: 03/06/1998  . Smokeless tobacco: Never Used  . Alcohol Use: No  . Drug Use: No  . Sexual Activity: Yes    Birth Control/ Protection: Surgical   Other Topics Concern  . Not on file   Social History Narrative    Allergies  Allergen Reactions  . Celebrex  [Celecoxib]     Dark stools.  . Penicillins Other (See Comments)    Childhood reaction  . Prevacid  [Lansoprazole] Diarrhea    Review of Systems  Constitutional: Negative.   HENT: Negative.   Eyes: Negative.   Respiratory: Negative.   Cardiovascular: Negative.   Gastrointestinal: Negative.   Genitourinary: Negative.   Musculoskeletal: Negative.   Skin: Negative.   Neurological: Negative.   Endo/Heme/Allergies: Negative.   Psychiatric/Behavioral: Negative.     Immunization History  Administered Date(s) Administered  . Pneumococcal Conjugate-13 02/06/2014  . Pneumococcal Polysaccharide-23 07/04/2012  . Tdap 07/04/2012  . Zoster 07/04/2012   Objective:  BP 148/78 mmHg  Pulse 68  Temp(Src) 97.5 F (36.4 C) (Oral)  Resp 16  Wt 156 lb (70.761 kg)  Physical Exam  Constitutional: She is oriented to person, place, and time and well-developed, well-nourished, and in no distress.  HENT:  Head: Normocephalic and atraumatic.  Right Ear: External ear  normal.  Left Ear: External ear normal.  Nose: Nose normal.  Eyes: Conjunctivae and EOM are normal. Pupils are equal, round, and reactive to light.  Neck: Normal range of motion. Neck supple.  Cardiovascular: Normal rate, regular rhythm, normal heart sounds and intact distal pulses.   Pulmonary/Chest: Effort normal and breath sounds normal.  Abdominal: Soft.  Musculoskeletal: Normal range of motion.  Neurological: She is alert and oriented to person, place, and time. She has normal reflexes. Gait normal. GCS score is 15.  Skin: Skin is warm and dry.  Psychiatric: Mood, memory, affect and judgment normal.    Lab Results  Component Value Date   WBC 5.1 05/01/2015   HGB 8.6* 12/08/2014   HCT 34.5 05/01/2015   PLT 246 05/01/2015   GLUCOSE 96 12/08/2014   CHOL 193 05/01/2015   TRIG 141 05/01/2015   HDL 39* 05/01/2015   LDLCALC 126* 05/01/2015   TSH 1.59 05/22/2014   HGBA1C 5.8* 05/01/2015    CMP     Component Value Date/Time   NA 142 12/08/2014 1748   NA 142 09/12/2014 1119   NA  144 12/31/2013 0056   K 4.1 12/08/2014 1748   K 4.1 12/31/2013 0056   CL 109 12/08/2014 1748   CL 108* 12/31/2013 0056   CO2 27 12/08/2014 1748   CO2 30 12/31/2013 0056   GLUCOSE 96 12/08/2014 1748   GLUCOSE 89 09/12/2014 1119   GLUCOSE 101* 12/31/2013 0056   BUN 24* 12/08/2014 1748   BUN 19 09/12/2014 1119   BUN 18 12/31/2013 0056   CREATININE 1.30* 01/21/2015 1001   CREATININE 1.2* 05/22/2014   CREATININE 1.27 01/17/2014 1520   CALCIUM 9.0 12/08/2014 1748   CALCIUM 8.1* 12/31/2013 0056   PROT 8.1 03/21/2012 1953   ALBUMIN 3.7 09/12/2014 1119   ALBUMIN 4.1 03/21/2012 1953   AST 16 07/11/2013   AST 19 03/21/2012 1953   ALT 14 07/11/2013   ALT 18 03/21/2012 1953   ALKPHOS 67 07/11/2013   ALKPHOS 74 03/21/2012 1953   BILITOT 0.4 03/21/2012 1953   GFRNONAA 44* 12/08/2014 1748   GFRNONAA 44* 01/17/2014 1520   GFRNONAA 59* 03/25/2012 0611   GFRAA 51* 12/08/2014 1748   GFRAA 53*  01/17/2014 1520   GFRAA >60 03/25/2012 0611    Assessment and Plan :   1. Anemia, unspecified anemia type  - CBC with Differential/Platelet  2. Essential (primary) hypertension   3. Dysuria  - POCT urinalysis dipstick  4. Acute cystitis with hematuria  - Urine culture  - nitrofurantoin, macrocrystal-monohydrate, (MACROBID) 100 MG capsule; Take 1 capsule (100 mg total) by mouth 2 (two) times daily.  Dispense: 6 capsule; Refill: 1  Patient was seen and examined by Dr. Miguel Aschoff, and noted scribed by Webb Laws, [place] MD [facility] 08/05/2015 11:24 AM

## 2015-08-06 LAB — CBC WITH DIFFERENTIAL/PLATELET
BASOS ABS: 0.1 10*3/uL (ref 0.0–0.2)
Basos: 1 %
EOS (ABSOLUTE): 0.3 10*3/uL (ref 0.0–0.4)
Eos: 5 %
HEMOGLOBIN: 9.8 g/dL — AB (ref 11.1–15.9)
Hematocrit: 31.9 % — ABNORMAL LOW (ref 34.0–46.6)
IMMATURE GRANS (ABS): 0 10*3/uL (ref 0.0–0.1)
Immature Granulocytes: 0 %
LYMPHS: 22 %
Lymphocytes Absolute: 1.3 10*3/uL (ref 0.7–3.1)
MCH: 25.8 pg — ABNORMAL LOW (ref 26.6–33.0)
MCHC: 30.7 g/dL — ABNORMAL LOW (ref 31.5–35.7)
MCV: 84 fL (ref 79–97)
MONOCYTES: 9 %
Monocytes Absolute: 0.5 10*3/uL (ref 0.1–0.9)
Neutrophils Absolute: 3.7 10*3/uL (ref 1.4–7.0)
Neutrophils: 63 %
Platelets: 289 10*3/uL (ref 150–379)
RBC: 3.8 x10E6/uL (ref 3.77–5.28)
RDW: 17.4 % — ABNORMAL HIGH (ref 12.3–15.4)
WBC: 6 10*3/uL (ref 3.4–10.8)

## 2015-08-07 LAB — URINE CULTURE: Organism ID, Bacteria: NO GROWTH

## 2015-08-07 LAB — PLEASE NOTE

## 2015-08-08 ENCOUNTER — Telehealth: Payer: Self-pay

## 2015-08-08 ENCOUNTER — Telehealth: Payer: Self-pay | Admitting: Family Medicine

## 2015-08-08 NOTE — Telephone Encounter (Signed)
error 

## 2015-08-08 NOTE — Telephone Encounter (Signed)
Patient wants to know if she can still have her tooth extraction done being that her hemoglobin is low? Please advise. Thanks!

## 2015-08-09 NOTE — Telephone Encounter (Signed)
yes

## 2015-08-11 NOTE — Telephone Encounter (Signed)
Advised  ED 

## 2015-08-19 DIAGNOSIS — I70213 Atherosclerosis of native arteries of extremities with intermittent claudication, bilateral legs: Secondary | ICD-10-CM | POA: Diagnosis not present

## 2015-08-19 DIAGNOSIS — I6529 Occlusion and stenosis of unspecified carotid artery: Secondary | ICD-10-CM | POA: Diagnosis not present

## 2015-08-19 DIAGNOSIS — I150 Renovascular hypertension: Secondary | ICD-10-CM | POA: Diagnosis not present

## 2015-08-19 DIAGNOSIS — I701 Atherosclerosis of renal artery: Secondary | ICD-10-CM | POA: Diagnosis not present

## 2015-08-19 DIAGNOSIS — E785 Hyperlipidemia, unspecified: Secondary | ICD-10-CM | POA: Diagnosis not present

## 2015-08-19 DIAGNOSIS — I739 Peripheral vascular disease, unspecified: Secondary | ICD-10-CM | POA: Diagnosis not present

## 2015-08-22 DIAGNOSIS — L438 Other lichen planus: Secondary | ICD-10-CM | POA: Diagnosis not present

## 2015-08-29 DIAGNOSIS — Z5181 Encounter for therapeutic drug level monitoring: Secondary | ICD-10-CM | POA: Diagnosis not present

## 2015-08-29 DIAGNOSIS — L438 Other lichen planus: Secondary | ICD-10-CM | POA: Diagnosis not present

## 2015-12-04 DIAGNOSIS — L438 Other lichen planus: Secondary | ICD-10-CM | POA: Diagnosis not present

## 2015-12-04 DIAGNOSIS — L810 Postinflammatory hyperpigmentation: Secondary | ICD-10-CM | POA: Diagnosis not present

## 2015-12-17 DIAGNOSIS — L439 Lichen planus, unspecified: Secondary | ICD-10-CM | POA: Diagnosis not present

## 2015-12-17 DIAGNOSIS — Z5181 Encounter for therapeutic drug level monitoring: Secondary | ICD-10-CM | POA: Diagnosis not present

## 2016-01-23 DIAGNOSIS — K257 Chronic gastric ulcer without hemorrhage or perforation: Secondary | ICD-10-CM | POA: Diagnosis not present

## 2016-01-23 DIAGNOSIS — I2510 Atherosclerotic heart disease of native coronary artery without angina pectoris: Secondary | ICD-10-CM | POA: Diagnosis not present

## 2016-01-23 DIAGNOSIS — I10 Essential (primary) hypertension: Secondary | ICD-10-CM | POA: Diagnosis not present

## 2016-02-04 ENCOUNTER — Ambulatory Visit (INDEPENDENT_AMBULATORY_CARE_PROVIDER_SITE_OTHER): Payer: PPO | Admitting: Family Medicine

## 2016-02-04 ENCOUNTER — Encounter: Payer: Self-pay | Admitting: Family Medicine

## 2016-02-04 ENCOUNTER — Other Ambulatory Visit: Payer: Self-pay

## 2016-02-04 VITALS — BP 142/62 | HR 82 | Temp 97.7°F | Resp 16 | Wt 151.0 lb

## 2016-02-04 DIAGNOSIS — I10 Essential (primary) hypertension: Secondary | ICD-10-CM | POA: Diagnosis not present

## 2016-02-04 DIAGNOSIS — R42 Dizziness and giddiness: Secondary | ICD-10-CM | POA: Diagnosis not present

## 2016-02-04 DIAGNOSIS — R739 Hyperglycemia, unspecified: Secondary | ICD-10-CM

## 2016-02-04 DIAGNOSIS — D649 Anemia, unspecified: Secondary | ICD-10-CM

## 2016-02-04 NOTE — Progress Notes (Signed)
Subjective:  HPI Anemia-Pt is her today for 6 month follow up. Last labs were done on 08/05/15 and showed anemia, she was suppose to have CBC rechecked in 1 month but did not. Also will need Iron level checked.   Hyperglycemia- Will check A1c with other labs today.  Lab Results  Component Value Date   HGBA1C 5.8 (H) 05/01/2015   HGBA1C 5.5 03/22/2012    Pertinent Labs:    Component Value Date/Time   CHOL 193 05/01/2015 0938   CHOL 163 03/22/2012 0400   TRIG 141 05/01/2015 0938   TRIG 75 03/22/2012 0400   HDL 39 (L) 05/01/2015 0938   HDL 39 (L) 03/22/2012 0400   LDLCALC 126 (H) 05/01/2015 0938   LDLCALC 109 (H) 03/22/2012 0400   CREATININE 1.30 (H) 01/21/2015 1001   CREATININE 1.27 01/17/2014 1520    Wt Readings from Last 3 Encounters:  02/04/16 151 lb (68.5 kg)  08/05/15 156 lb (70.8 kg)  04/30/15 157 lb (71.2 kg)   ------------------------------------------------------------------------  Hypertension, follow-up:  BP Readings from Last 3 Encounters:  02/04/16 (!) 142/62  08/05/15 (!) 148/78  04/30/15 140/72    She was last seen for hypertension 6 months ago.  BP at that visit was 148/78. Management since that visit includes None. Outside blood pressures are 140's/60's. She is experiencing dyspnea only when bending over and picking up things sometimes.  Patient denies chest pain, chest pressure/discomfort, claudication, exertional chest pressure/discomfort, fatigue, irregular heart beat, lower extremity edema, near-syncope, orthopnea, palpitations, paroxysmal nocturnal dyspnea, syncope and tachypnea.    Wt Readings from Last 3 Encounters:  02/04/16 151 lb (68.5 kg)  08/05/15 156 lb (70.8 kg)  04/30/15 157 lb (71.2 kg)    ------------------------------------------------------------------------  Pt reports that her cardiologist took her off of all her medications except her Lisinopril, omeprazole and ASA to see if her stomach and dizziness got better.    Overall she feels well but has not had recommended f/u for chronic problems.  Prior to Admission medications   Medication Sig Start Date End Date Taking? Authorizing Provider  aspirin EC 81 MG tablet Take 81 mg by mouth daily.    Historical Provider, MD  atorvastatin (LIPITOR) 40 MG tablet Take by mouth. 06/26/15 06/25/16  Historical Provider, MD  Clobetasol Prop Emollient Base (CLOBETASOL PROPIONATE E) 0.05 % emollient cream Apply 1 g topically daily. Per vagina/ urethra 04/28/15   Hollice Espy, MD  conjugated estrogens (PREMARIN) vaginal cream Pea sized amount per urethral M_W_Fr 04/29/15   Hollice Espy, MD  ferrous sulfate 325 (65 FE) MG tablet Take 1 tablet (325 mg total) by mouth daily. 12/11/14   Richard Maceo Pro., MD  fluocinonide ointment (LIDEX) 0.05 %  01/08/15   Historical Provider, MD  folic acid (FOLVITE) 1 MG tablet Reported on 04/25/2015 03/27/15   Historical Provider, MD  FOLIC ACID PO Take by mouth.    Historical Provider, MD  lisinopril (PRINIVIL,ZESTRIL) 10 MG tablet Take 1 tablet by mouth daily.    Historical Provider, MD  meloxicam (MOBIC) 7.5 MG tablet TAKE 1 TABLET BY MOUTH 2 TIMES A DAY AS NEEDED 05/21/15   Jerrol Banana., MD  methotrexate [facility]) 2.5 MG tablet Reported on 04/25/2015 03/27/15   Historical Provider, MD  Omeprazole 20 MG TBEC Take 1 tablet (20 mg total) by mouth daily as needed (for upset stomach.). 09/02/14   Jerrol Banana., MD    Patient Active Problem List   Diagnosis Date Noted  .  Gastric ulcer requiring drug therapy, chronic 01/23/2016  . MI (mitral incompetence) 07/22/2015  . Combined fat and carbohydrate induced hyperlipemia 12/26/2014  . UTI (lower urinary tract infection) 08/18/2014  . Blurry vision 08/18/2014  . Allergic rhinitis 07/12/2014  . Absolute anemia 07/12/2014  . Baker's cyst of knee 07/12/2014  . Atherosclerosis of coronary artery 07/12/2014  . CAFL (chronic airflow limitation) ([place]) 07/12/2014  . Dizziness  07/12/2014  . Essential (primary) hypertension 07/12/2014  . Acid reflux 07/12/2014  . 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 History of colon polyps 07/12/2014  . Hypercholesteremia 07/12/2014  . Malaise and fatigue 07/12/2014  . Heart attack 07/12/2014  . Muscle ache 07/12/2014  . Arthritis, degenerative 07/12/2014  . Peripheral blood vessel disorder ([place]) 07/12/2014  . Breath shortness 07/12/2014  . Bradycardia 08/23/2013  . Arteriosclerosis of coronary artery 08/17/2013  . Diabetes ([place]) 08/17/2013  . Diabetes mellitus ([place]) 08/17/2013  . Peripheral vascular disease ([place]) 08/17/2013    Past Medical History:  Diagnosis Date  . GERD (gastroesophageal reflux disease)   . Hyperlipidemia   . Hypertension   . MI (myocardial infarction)     Social History   Social History  . Marital status: Married    Spouse name: [name]  . Number of children: 2  . Years of education: N/A   Occupational History  . retired    Social History Main Topics  . Smoking status: Former Smoker    Packs/day: 0.25    Years: 10.00    Types: Cigarettes    Quit date: 03/06/1998  . Smokeless tobacco: Never Used  . Alcohol use No  . Drug use: No  . Sexual activity: Yes    Birth control/ protection: Surgical   Other Topics Concern  . Not on file   Social History Narrative  . No narrative on file    Allergies  Allergen Reactions  . Celebrex  [Celecoxib]     Dark stools.  . Penicillins Other (See Comments)    Childhood reaction  . Prevacid  [Lansoprazole] Diarrhea    Review of Systems  Constitutional: Positive for malaise/fatigue.  HENT: Negative.   Eyes: Negative.   Respiratory: Positive for shortness of breath (only when bending over to pick up things).   Cardiovascular: Negative.   Gastrointestinal: Positive for abdominal pain.  Genitourinary: Negative.   Musculoskeletal: Negative.   Skin: Negative.   Neurological: Positive for dizziness.  Endo/Heme/Allergies: Negative.     Psychiatric/Behavioral: Negative.     Immunization History  Administered Date(s) Administered  . Pneumococcal Conjugate-13 02/06/2014  . Pneumococcal Polysaccharide-23 07/04/2012  . Tdap 07/04/2012  . Zoster 07/04/2012    Objective:  BP (!) 142/62 (BP Location: Left Arm, Patient Position: Sitting, Cuff Size: Normal)   Pulse 82   Temp 97.7 F (36.5 C) (Oral)   Resp 16   Wt 151 lb (68.5 kg)   BMI 25.13 kg/m   Physical Exam  Constitutional: She is oriented to person, place, and time and well-developed, well-nourished, and in no distress.  HENT:  Head: Normocephalic and atraumatic.  Eyes: Conjunctivae and EOM are normal. Pupils are equal, round, and reactive to light. No scleral icterus.  Neck: Normal range of motion. Neck supple. No thyromegaly present.  Cardiovascular: Normal rate, regular rhythm, normal heart sounds and intact distal pulses.   Pulmonary/Chest: Effort normal and breath sounds normal.  Abdominal: Soft.  Musculoskeletal: Normal range of motion.  Neurological: She is alert and oriented to person, place, and time. She has normal reflexes. Gait normal.  GCS score is 15.  Skin: Skin is warm and dry. Rash noted.  Chronic rash on arms followed by dermatology.  Psychiatric: Mood, memory, affect and judgment normal.    Lab Results  Component Value Date   WBC 6.0 08/05/2015   HGB 8.6 (L) 12/08/2014   HCT 31.9 (L) 08/05/2015   PLT 289 08/05/2015   GLUCOSE 96 12/08/2014   CHOL 193 05/01/2015   TRIG 141 05/01/2015   HDL 39 (L) 05/01/2015   LDLCALC 126 (H) 05/01/2015   TSH 1.59 05/22/2014   HGBA1C 5.8 (H) 05/01/2015    CMP     Component Value Date/Time   NA 142 12/08/2014 1748   NA 142 09/12/2014 1119   NA 144 12/31/2013 0056   K 4.1 12/08/2014 1748   K 4.1 12/31/2013 0056   CL 109 12/08/2014 1748   CL 108 (H) 12/31/2013 0056   CO2 27 12/08/2014 1748   CO2 30 12/31/2013 0056   GLUCOSE 96 12/08/2014 1748   GLUCOSE 101 (H) 12/31/2013 0056   BUN 24 (H)  12/08/2014 1748   BUN 19 09/12/2014 1119   BUN 18 12/31/2013 0056   CREATININE 1.30 (H) 01/21/2015 1001   CREATININE 1.27 01/17/2014 1520   CALCIUM 9.0 12/08/2014 1748   CALCIUM 8.1 (L) 12/31/2013 0056   PROT 8.1 03/21/2012 1953   ALBUMIN 3.7 09/12/2014 1119   ALBUMIN 4.1 03/21/2012 1953   AST 16 07/11/2013   AST 19 03/21/2012 1953   ALT 14 07/11/2013   ALT 18 03/21/2012 1953   ALKPHOS 67 07/11/2013   ALKPHOS 74 03/21/2012 1953   BILITOT 0.4 03/21/2012 1953   GFRNONAA 44 (L) 12/08/2014 1748   GFRNONAA 44 (L) 01/17/2014 1520   GFRNONAA 59 (L) 03/25/2012 0611   GFRAA 51 (L) 12/08/2014 1748   GFRAA 53 (L) 01/17/2014 1520   GFRAA >60 03/25/2012 0611    Assessment and Plan :  1. Essential (primary) hypertension   2. Hyperglycemia  - Hemoglobin A1c  3. Dizziness W/u by cardiology.  - CBC with Differential/Platelet  4. Anemia, unspecified type May need further treatment. - CBC with Differential/Platelet - Iron 5.Chronic Gastritis Stop all NSAID. 6.Chronic Rash Recent discontinuation of some medication by dermatology. Rash is not symptomatic. HPI, Exam, and A&P Transcribed under the direction and in the presence of Richard L. Cranford Mon, MD  Electronically Signed: Webb Laws, [place] MD [facility] 02/04/2016 11:24 AM

## 2016-02-05 ENCOUNTER — Telehealth: Payer: Self-pay

## 2016-02-05 DIAGNOSIS — D649 Anemia, unspecified: Secondary | ICD-10-CM

## 2016-02-05 LAB — CBC WITH DIFFERENTIAL/PLATELET
BASOS ABS: 0.1 10*3/uL (ref 0.0–0.2)
BASOS: 2 %
EOS (ABSOLUTE): 0.1 10*3/uL (ref 0.0–0.4)
Eos: 3 %
HEMOGLOBIN: 8.6 g/dL — AB (ref 11.1–15.9)
Hematocrit: 28.2 % — ABNORMAL LOW (ref 34.0–46.6)
IMMATURE GRANS (ABS): 0 10*3/uL (ref 0.0–0.1)
IMMATURE GRANULOCYTES: 1 %
LYMPHS: 22 %
Lymphocytes Absolute: 1 10*3/uL (ref 0.7–3.1)
MCH: 23.8 pg — AB (ref 26.6–33.0)
MCHC: 30.5 g/dL — ABNORMAL LOW (ref 31.5–35.7)
MCV: 78 fL — AB (ref 79–97)
MONOCYTES: 9 %
Monocytes Absolute: 0.4 10*3/uL (ref 0.1–0.9)
NEUTROS ABS: 2.8 10*3/uL (ref 1.4–7.0)
NEUTROS PCT: 63 %
PLATELETS: 326 10*3/uL (ref 150–379)
RBC: 3.62 x10E6/uL — ABNORMAL LOW (ref 3.77–5.28)
RDW: 20.7 % — ABNORMAL HIGH (ref 12.3–15.4)
WBC: 4.4 10*3/uL (ref 3.4–10.8)

## 2016-02-05 LAB — HEMOGLOBIN A1C
Est. average glucose Bld gHb Est-mCnc: 128 mg/dL
HEMOGLOBIN A1C: 6.1 % — AB (ref 4.8–5.6)

## 2016-02-05 LAB — IRON: IRON: 18 ug/dL — AB (ref 27–139)

## 2016-02-05 NOTE — Telephone Encounter (Signed)
-----   Message from Jerrol Banana., MD sent at 02/05/2016  9:21 AM EST ----- Has pt bneen on iron --if so then would refer to hematology for consideration of parenteral iron--if not start BID iron and RTC 2 weeks.

## 2016-02-05 NOTE — Telephone Encounter (Signed)
Patient has been advised order has been placed in chart. KW

## 2016-02-16 DIAGNOSIS — H2513 Age-related nuclear cataract, bilateral: Secondary | ICD-10-CM | POA: Diagnosis not present

## 2016-02-16 DIAGNOSIS — H40003 Preglaucoma, unspecified, bilateral: Secondary | ICD-10-CM | POA: Diagnosis not present

## 2016-02-19 ENCOUNTER — Ambulatory Visit (INDEPENDENT_AMBULATORY_CARE_PROVIDER_SITE_OTHER): Payer: PPO | Admitting: Family Medicine

## 2016-02-19 VITALS — BP 144/60 | HR 82 | Temp 97.8°F | Resp 14 | Wt 150.0 lb

## 2016-02-19 DIAGNOSIS — E782 Mixed hyperlipidemia: Secondary | ICD-10-CM | POA: Diagnosis not present

## 2016-02-19 DIAGNOSIS — E784 Other hyperlipidemia: Secondary | ICD-10-CM | POA: Diagnosis not present

## 2016-02-19 DIAGNOSIS — D649 Anemia, unspecified: Secondary | ICD-10-CM | POA: Diagnosis not present

## 2016-02-19 DIAGNOSIS — E7849 Other hyperlipidemia: Secondary | ICD-10-CM

## 2016-02-19 DIAGNOSIS — I2510 Atherosclerotic heart disease of native coronary artery without angina pectoris: Secondary | ICD-10-CM | POA: Diagnosis not present

## 2016-02-19 DIAGNOSIS — R42 Dizziness and giddiness: Secondary | ICD-10-CM

## 2016-02-19 DIAGNOSIS — I10 Essential (primary) hypertension: Secondary | ICD-10-CM

## 2016-02-19 NOTE — Progress Notes (Signed)
[name] [name]  MRN: [id number] DOB: [birth year]  Subjective:  HPI  Patient is here for follow up from last visit in November. Her CBC was done then and levels were low so she is back on iron twice daily.  Overall shhe feels fairly well and has no complaints.   Patient Active Problem List   Diagnosis Date Noted  . Gastric ulcer requiring drug therapy, chronic 01/23/2016  . MI (mitral incompetence) 07/22/2015  . Combined fat and carbohydrate induced hyperlipemia 12/26/2014  . UTI (lower urinary tract infection) 08/18/2014  . Blurry vision 08/18/2014  . Allergic rhinitis 07/12/2014  . Absolute anemia 07/12/2014  . Baker's cyst of knee 07/12/2014  . Atherosclerosis of coronary artery 07/12/2014  . CAFL (chronic airflow limitation) ([place]) 07/12/2014  . Dizziness 07/12/2014  . Essential (primary) hypertension 07/12/2014  . Acid reflux 07/12/2014  . 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 History of colon polyps 07/12/2014  . Hypercholesteremia 07/12/2014  . Malaise and fatigue 07/12/2014  . Heart attack 07/12/2014  . Muscle ache 07/12/2014  . Arthritis, degenerative 07/12/2014  . Peripheral blood vessel disorder ([place]) 07/12/2014  . Breath shortness 07/12/2014  . Bradycardia 08/23/2013  . Arteriosclerosis of coronary artery 08/17/2013  . Diabetes ([place]) 08/17/2013  . Diabetes mellitus ([place]) 08/17/2013  . Peripheral vascular disease ([place]) 08/17/2013    Past Medical History:  Diagnosis Date  . GERD (gastroesophageal reflux disease)   . Hyperlipidemia   . Hypertension   . MI (myocardial infarction)     Social History   Social History  . Marital status: Married    Spouse name: [name]  . Number of children: 2  . Years of education: N/A   Occupational History  . retired    Social History Main Topics  . Smoking status: Former Smoker    Packs/day: 0.25    Years: 10.00    Types: Cigarettes    Quit date: 03/06/1998  . Smokeless tobacco: Never Used  . Alcohol use No  .  Drug use: No  . Sexual activity: Yes    Birth control/ protection: Surgical   Other Topics Concern  . Not on file   Social History Narrative  . No narrative on file    Outpatient Encounter Prescriptions as of 02/19/2016  Medication Sig Note  . aspirin EC 81 MG tablet Take 81 mg by mouth daily. 08/18/2014: Received from: [facility]: Take 81 mg by mouth.  . Clobetasol Prop Emollient Base (CLOBETASOL PROPIONATE E) 0.05 % emollient cream Apply 1 g topically daily. Per vagina/ urethra   . conjugated estrogens (PREMARIN) vaginal cream Pea sized amount per urethral M_W_Fr   . ferrous sulfate 325 (65 FE) MG tablet Take 1 tablet (325 mg total) by mouth daily.   . fluocinonide ointment (LIDEX) 0.05 %  01/23/2015: Received from: External Pharmacy  . lisinopril (PRINIVIL,ZESTRIL) 10 MG tablet Take 1 tablet by mouth daily. 07/12/2014: Received from: [facility]:   . methotrexate (RHEUMATREX) 2.5 MG tablet Reported on 04/25/2015 04/25/2015: Received from: External Pharmacy  . Omeprazole 20 MG TBEC Take 1 tablet (20 mg total) by mouth daily as needed (for upset stomach.).   Marland Kitchen atorvastatin (LIPITOR) 40 MG tablet Take by mouth. 08/05/2015: Received from: [facility]  . [DISCONTINUED] folic acid (FOLVITE) 1 MG tablet Reported on 04/25/2015 04/25/2015: Received from: External Pharmacy  . [DISCONTINUED] FOLIC ACID PO Take by mouth. 02/04/2016: Received from: [facility]: Take by mouth.  . [  DISCONTINUED] meloxicam (MOBIC) 7.5 MG tablet TAKE 1 TABLET BY MOUTH 2 TIMES A DAY AS NEEDED (Patient not taking: Reported on 02/04/2016)    No facility-administered encounter medications on file as of 02/19/2016.     Allergies  Allergen Reactions  . Celebrex  [Celecoxib]     Dark stools.  . Penicillins Other (See Comments)    Childhood reaction  . Prevacid  [Lansoprazole] Diarrhea    Review of Systems  Constitutional:  Positive for malaise/fatigue.  Respiratory: Positive for shortness of breath (slight).   Cardiovascular: Negative.   Gastrointestinal: Negative.   Musculoskeletal: Negative.   Neurological: Positive for dizziness and weakness.  Endo/Heme/Allergies: Negative.   Psychiatric/Behavioral: Negative.     Objective:  BP (!) 144/60   Pulse 82   Temp 97.8 F (36.6 C)   Resp 14   Wt 150 lb (68 kg)   BMI 24.96 kg/m   Physical Exam  Constitutional: She is oriented to person, place, and time and well-developed, well-nourished, and in no distress.  HENT:  Head: Normocephalic and atraumatic.  Right Ear: External ear normal.  Left Ear: External ear normal.  Nose: Nose normal.  Eyes: Conjunctivae are normal. Pupils are equal, round, and reactive to light. No scleral icterus.  Neck: Normal range of motion. Neck supple. No thyromegaly present.  Cardiovascular: Normal rate, regular rhythm, normal heart sounds and intact distal pulses.   No murmur heard. Pulmonary/Chest: Effort normal and breath sounds normal. No respiratory distress. She has no wheezes.  Abdominal: Soft.  Musculoskeletal: She exhibits no edema or tenderness.  Neurological: She is alert and oriented to person, place, and time.  Skin: Skin is warm and dry.  Psychiatric: Mood, memory, affect and judgment normal.    Assessment and Plan :  1. Anemia, unspecified type Re check labs today. Will re check levels again on the next visit. Iron deficiency anemia. - CBC w/Diff/Platelet - Iron  2. Dizziness Better a little. Follow.  3. Essential (primary) hypertension  4. Other hyperlipidemia She is not sure if she is feeling better or not off the Lipitor. Will re check lipids on the next visit and will then make decision about this medication.  HPI, Exam and A&P transcribed under direction and in the presence of Miguel Aschoff, MD. I have done the exam and reviewed the chart and it is accurate to the best of my knowledge. Risk manager has been used and  any errors in dictation or transcription are unintentional. Miguel Aschoff M.D. [facility]

## 2016-02-20 LAB — CBC WITH DIFFERENTIAL/PLATELET
BASOS ABS: 0.1 10*3/uL (ref 0.0–0.2)
BASOS: 2 %
EOS (ABSOLUTE): 0.1 10*3/uL (ref 0.0–0.4)
Eos: 3 %
HEMATOCRIT: 32.4 % — AB (ref 34.0–46.6)
Hemoglobin: 9.7 g/dL — ABNORMAL LOW (ref 11.1–15.9)
IMMATURE GRANULOCYTES: 0 %
Immature Grans (Abs): 0 10*3/uL (ref 0.0–0.1)
LYMPHS: 22 %
Lymphocytes Absolute: 1.1 10*3/uL (ref 0.7–3.1)
MCH: 24.9 pg — ABNORMAL LOW (ref 26.6–33.0)
MCHC: 29.9 g/dL — AB (ref 31.5–35.7)
MCV: 83 fL (ref 79–97)
MONOS ABS: 0.4 10*3/uL (ref 0.1–0.9)
Monocytes: 9 %
NEUTROS PCT: 64 %
Neutrophils Absolute: 3.2 10*3/uL (ref 1.4–7.0)
PLATELETS: 296 10*3/uL (ref 150–379)
RBC: 3.9 x10E6/uL (ref 3.77–5.28)
RDW: 25.6 % — AB (ref 12.3–15.4)
WBC: 5 10*3/uL (ref 3.4–10.8)

## 2016-02-20 LAB — IRON: Iron: 91 ug/dL (ref 27–139)

## 2016-03-08 DIAGNOSIS — C50919 Malignant neoplasm of unspecified site of unspecified female breast: Secondary | ICD-10-CM

## 2016-03-08 HISTORY — DX: Malignant neoplasm of unspecified site of unspecified female breast: C50.919

## 2016-03-10 ENCOUNTER — Ambulatory Visit: Payer: Commercial Managed Care - HMO | Admitting: Hematology and Oncology

## 2016-03-25 ENCOUNTER — Ambulatory Visit: Payer: PPO | Admitting: Family Medicine

## 2016-03-29 ENCOUNTER — Ambulatory Visit (INDEPENDENT_AMBULATORY_CARE_PROVIDER_SITE_OTHER): Payer: PPO | Admitting: Family Medicine

## 2016-03-29 VITALS — BP 160/80 | HR 78 | Temp 97.9°F | Resp 16 | Wt 152.0 lb

## 2016-03-29 DIAGNOSIS — I10 Essential (primary) hypertension: Secondary | ICD-10-CM

## 2016-03-29 DIAGNOSIS — E7800 Pure hypercholesterolemia, unspecified: Secondary | ICD-10-CM | POA: Diagnosis not present

## 2016-03-29 DIAGNOSIS — E119 Type 2 diabetes mellitus without complications: Secondary | ICD-10-CM

## 2016-03-29 NOTE — Progress Notes (Signed)
[name] [name]  MRN: [id number] DOB: 1940/09/13  Subjective:  HPI   The patient is a 76 year old female who presents for follow up of her anemia.  She was last seen on 02/19/16.   She presents today for repeat CBC. She denies any shortness of breath, increased bleeding or bruising and fatigue.   Patient Active Problem List   Diagnosis Date Noted  . Gastric ulcer requiring drug therapy, chronic 01/23/2016  . MI (mitral incompetence) 07/22/2015  . Combined fat and carbohydrate induced hyperlipemia 12/26/2014  . UTI (lower urinary tract infection) 08/18/2014  . Blurry vision 08/18/2014  . Allergic rhinitis 07/12/2014  . Absolute anemia 07/12/2014  . Baker's cyst of knee 07/12/2014  . Atherosclerosis of coronary artery 07/12/2014  . CAFL (chronic airflow limitation) ([place]) 07/12/2014  . Dizziness 07/12/2014  . Essential (primary) hypertension 07/12/2014  . Acid reflux 07/12/2014  . 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 History of colon polyps 07/12/2014  . Hypercholesteremia 07/12/2014  . Malaise and fatigue 07/12/2014  . Heart attack 07/12/2014  . Muscle ache 07/12/2014  . Arthritis, degenerative 07/12/2014  . Peripheral blood vessel disorder ([place]) 07/12/2014  . Breath shortness 07/12/2014  . Bradycardia 08/23/2013  . Arteriosclerosis of coronary artery 08/17/2013  . Diabetes ([place]) 08/17/2013  . Diabetes mellitus ([place]) 08/17/2013  . Peripheral vascular disease ([place]) 08/17/2013    Past Medical History:  Diagnosis Date  . GERD (gastroesophageal reflux disease)   . Hyperlipidemia   . Hypertension   . MI (myocardial infarction)     Social History   Social History  . Marital status: Married    Spouse name: [name]  . Number of children: 2  . Years of education: N/A   Occupational History  . retired    Social History Main Topics  . Smoking status: Former Smoker    Packs/day: 0.25    Years: 10.00    Types: Cigarettes    Quit date: 03/06/1998  . Smokeless  tobacco: Never Used  . Alcohol use No  . Drug use: No  . Sexual activity: Yes    Birth control/ protection: Surgical   Other Topics Concern  . Not on file   Social History Narrative  . No narrative on file    Outpatient Encounter Prescriptions as of 03/29/2016  Medication Sig Note  . aspirin EC 81 MG tablet Take 81 mg by mouth daily. 08/18/2014: Received from: [place]: Take 81 mg by mouth.  Marland Kitchen atorvastatin (LIPITOR) 40 MG tablet Take by mouth. 08/05/2015: Received from: [place]  . Clobetasol Prop Emollient Base (CLOBETASOL PROPIONATE E) 0.05 % emollient cream Apply 1 g topically daily. Per vagina/ urethra   . conjugated estrogens (PREMARIN) vaginal cream Pea sized amount per urethral M_W_Fr   . ferrous sulfate 325 (65 FE) MG tablet Take 1 tablet (325 mg total) by mouth daily.   . fluocinonide ointment (LIDEX) 0.05 %  01/23/2015: Received from: External Pharmacy  . lisinopril (PRINIVIL,ZESTRIL) 10 MG tablet Take 1 tablet by mouth daily. 07/12/2014: Received from: [place]:   . methotrexate (RHEUMATREX) 2.5 MG tablet Reported on 04/25/2015 04/25/2015: Received from: External Pharmacy  . Omeprazole 20 MG TBEC Take 1 tablet (20 mg total) by mouth daily as needed (for upset stomach.).    No facility-administered encounter medications on file as of 03/29/2016.     Allergies  Allergen Reactions  . Celebrex  [Celecoxib]     Dark stools.  . Penicillins  Other (See Comments)    Childhood reaction  . Prevacid  [Lansoprazole] Diarrhea    ROS  Objective:  There were no vitals taken for this visit.  Physical Exam  Constitutional: She is oriented to person, place, and time and well-developed, well-nourished, and in no distress.  HENT:  Head: Normocephalic and atraumatic.  Eyes: Conjunctivae are normal. Pupils are equal, round, and reactive to light. No scleral icterus.  Neck: Normal range of motion. Neck supple. No  thyromegaly present.  Cardiovascular: Normal rate, regular rhythm, normal heart sounds and intact distal pulses.   Pulmonary/Chest: Effort normal and breath sounds normal.  Abdominal: Soft.  Neurological: She is alert and oriented to person, place, and time. Gait normal. GCS score is 15.  Skin: Skin is warm and dry.  Psychiatric: Mood, memory, affect and judgment normal.    Assessment and Plan :  1. Essential (primary) hypertension  - CBC with Differential/Platelet - Comprehensive metabolic panel - TSH  2. Type 2 diabetes mellitus without complication, unspecified long term insulin use status ([place])   3. Hypercholesteremia  - Lipid Panel With LDL/HDL Ratio 4.Anemia F/u CBC.  .HPI, Exam and A&P Transcribed under the direction and in the presence of Miguel Aschoff, Brooke Bonito., MD. Electronically Signed: Althea Charon, RMA I have done the exam and reviewed the chart and it is accurate to the best of my knowledge. Development worker, community has been used and  any errors in dictation or transcription are unintentional. Miguel Aschoff M.D. [facility]

## 2016-03-30 ENCOUNTER — Other Ambulatory Visit: Payer: Self-pay | Admitting: Family Medicine

## 2016-03-30 DIAGNOSIS — I10 Essential (primary) hypertension: Secondary | ICD-10-CM | POA: Diagnosis not present

## 2016-03-30 DIAGNOSIS — Z1231 Encounter for screening mammogram for malignant neoplasm of breast: Secondary | ICD-10-CM

## 2016-03-30 DIAGNOSIS — E7800 Pure hypercholesterolemia, unspecified: Secondary | ICD-10-CM | POA: Diagnosis not present

## 2016-03-31 LAB — COMPREHENSIVE METABOLIC PANEL
A/G RATIO: 1.7 (ref 1.2–2.2)
ALT: 12 IU/L (ref 0–32)
AST: 17 IU/L (ref 0–40)
Albumin: 3.9 g/dL (ref 3.5–4.8)
Alkaline Phosphatase: 66 IU/L (ref 39–117)
BILIRUBIN TOTAL: 0.6 mg/dL (ref 0.0–1.2)
BUN/Creatinine Ratio: 14 (ref 12–28)
BUN: 14 mg/dL (ref 8–27)
CALCIUM: 9.2 mg/dL (ref 8.7–10.3)
CHLORIDE: 103 mmol/L (ref 96–106)
CO2: 26 mmol/L (ref 18–29)
Creatinine, Ser: 0.97 mg/dL (ref 0.57–1.00)
GFR calc Af Amer: 65 mL/min/{1.73_m2} (ref >59)
GFR calc non Af Amer: 57 mL/min/{1.73_m2} — ABNORMAL LOW (ref >59)
GLOBULIN, TOTAL: 2.3 g/dL (ref 1.5–4.5)
Glucose: 88 mg/dL (ref 65–99)
POTASSIUM: 4 mmol/L (ref 3.5–5.2)
SODIUM: 145 mmol/L — AB (ref 134–144)
Total Protein: 6.2 g/dL (ref 6.0–8.5)

## 2016-03-31 LAB — CBC WITH DIFFERENTIAL/PLATELET
BASOS: 1 %
Basophils Absolute: 0.1 10*3/uL (ref 0.0–0.2)
EOS (ABSOLUTE): 0.2 10*3/uL (ref 0.0–0.4)
EOS: 4 %
HEMATOCRIT: 40 % (ref 34.0–46.6)
Hemoglobin: 12.6 g/dL (ref 11.1–15.9)
IMMATURE GRANS (ABS): 0 10*3/uL (ref 0.0–0.1)
IMMATURE GRANULOCYTES: 0 %
LYMPHS: 26 %
Lymphocytes Absolute: 1.3 10*3/uL (ref 0.7–3.1)
MCH: 27.8 pg (ref 26.6–33.0)
MCHC: 31.5 g/dL (ref 31.5–35.7)
MCV: 88 fL (ref 79–97)
Monocytes Absolute: 0.3 10*3/uL (ref 0.1–0.9)
Monocytes: 6 %
NEUTROS PCT: 63 %
Neutrophils Absolute: 3.2 10*3/uL (ref 1.4–7.0)
Platelets: 231 10*3/uL (ref 150–379)
RBC: 4.54 x10E6/uL (ref 3.77–5.28)
RDW: 23.1 % — ABNORMAL HIGH (ref 12.3–15.4)
WBC: 5.1 10*3/uL (ref 3.4–10.8)

## 2016-03-31 LAB — LIPID PANEL WITH LDL/HDL RATIO
Cholesterol, Total: 182 mg/dL (ref 100–199)
HDL: 44 mg/dL (ref >39)
LDL Calculated: 117 mg/dL — ABNORMAL HIGH (ref 0–99)
LDL/HDL RATIO: 2.7 ratio (ref 0.0–3.2)
TRIGLYCERIDES: 103 mg/dL (ref 0–149)
VLDL Cholesterol Cal: 21 mg/dL (ref 5–40)

## 2016-03-31 LAB — TSH: TSH: 1.94 u[IU]/mL (ref 0.450–4.500)

## 2016-04-05 DIAGNOSIS — M1711 Unilateral primary osteoarthritis, right knee: Secondary | ICD-10-CM | POA: Diagnosis not present

## 2016-04-05 DIAGNOSIS — M7542 Impingement syndrome of left shoulder: Secondary | ICD-10-CM | POA: Diagnosis not present

## 2016-04-27 ENCOUNTER — Ambulatory Visit
Admission: RE | Admit: 2016-04-27 | Discharge: 2016-04-27 | Disposition: A | Discharge status: Home / Self Care | Payer: PPO | Source: Ambulatory Visit | Attending: Family Medicine | Admitting: Family Medicine

## 2016-04-27 DIAGNOSIS — R928 Other abnormal and inconclusive findings on diagnostic imaging of breast: Secondary | ICD-10-CM | POA: Diagnosis not present

## 2016-04-27 DIAGNOSIS — Z1231 Encounter for screening mammogram for malignant neoplasm of breast: Secondary | ICD-10-CM | POA: Insufficient documentation

## 2016-04-28 ENCOUNTER — Other Ambulatory Visit: Payer: Self-pay | Admitting: Family Medicine

## 2016-04-28 DIAGNOSIS — R928 Other abnormal and inconclusive findings on diagnostic imaging of breast: Secondary | ICD-10-CM

## 2016-04-28 DIAGNOSIS — N6310 Unspecified lump in the right breast, unspecified quadrant: Secondary | ICD-10-CM

## 2016-05-06 ENCOUNTER — Ambulatory Visit: Payer: PPO | Admitting: Family Medicine

## 2016-05-18 ENCOUNTER — Ambulatory Visit
Admission: RE | Admit: 2016-05-18 | Discharge: 2016-05-18 | Disposition: A | Discharge status: Home / Self Care | Payer: PPO | Source: Ambulatory Visit | Attending: Family Medicine | Admitting: Family Medicine

## 2016-05-18 DIAGNOSIS — N6312 Unspecified lump in the right breast, upper inner quadrant: Secondary | ICD-10-CM | POA: Diagnosis not present

## 2016-05-18 DIAGNOSIS — R928 Other abnormal and inconclusive findings on diagnostic imaging of breast: Secondary | ICD-10-CM | POA: Diagnosis not present

## 2016-05-18 DIAGNOSIS — N6310 Unspecified lump in the right breast, unspecified quadrant: Secondary | ICD-10-CM

## 2016-05-19 ENCOUNTER — Other Ambulatory Visit: Payer: Self-pay | Admitting: Family Medicine

## 2016-05-19 DIAGNOSIS — R928 Other abnormal and inconclusive findings on diagnostic imaging of breast: Secondary | ICD-10-CM

## 2016-05-19 DIAGNOSIS — N6310 Unspecified lump in the right breast, unspecified quadrant: Secondary | ICD-10-CM

## 2016-06-28 ENCOUNTER — Ambulatory Visit: Payer: PPO | Admitting: Family Medicine

## 2016-06-30 ENCOUNTER — Ambulatory Visit (INDEPENDENT_AMBULATORY_CARE_PROVIDER_SITE_OTHER): Payer: PPO | Admitting: Family Medicine

## 2016-06-30 ENCOUNTER — Encounter: Payer: Self-pay | Admitting: Family Medicine

## 2016-06-30 ENCOUNTER — Ambulatory Visit (INDEPENDENT_AMBULATORY_CARE_PROVIDER_SITE_OTHER): Payer: PPO

## 2016-06-30 VITALS — BP 172/94 | HR 72 | Temp 98.6°F | Wt 150.8 lb

## 2016-06-30 VITALS — BP 186/98 | HR 72 | Temp 98.6°F | Ht 65.0 in | Wt 150.8 lb

## 2016-06-30 DIAGNOSIS — R350 Frequency of micturition: Secondary | ICD-10-CM

## 2016-06-30 DIAGNOSIS — N3000 Acute cystitis without hematuria: Secondary | ICD-10-CM | POA: Diagnosis not present

## 2016-06-30 DIAGNOSIS — Z0000 Encounter for general adult medical examination without abnormal findings: Secondary | ICD-10-CM | POA: Diagnosis not present

## 2016-06-30 DIAGNOSIS — R739 Hyperglycemia, unspecified: Secondary | ICD-10-CM

## 2016-06-30 DIAGNOSIS — R42 Dizziness and giddiness: Secondary | ICD-10-CM | POA: Diagnosis not present

## 2016-06-30 DIAGNOSIS — D649 Anemia, unspecified: Secondary | ICD-10-CM | POA: Diagnosis not present

## 2016-06-30 DIAGNOSIS — I10 Essential (primary) hypertension: Secondary | ICD-10-CM

## 2016-06-30 LAB — POCT URINALYSIS DIPSTICK
BILIRUBIN UA: NEGATIVE
Blood, UA: NEGATIVE
Glucose, UA: NEGATIVE
KETONES UA: NEGATIVE
Nitrite, UA: NEGATIVE
PH UA: 6 (ref 5.0–8.0)
Protein, UA: NEGATIVE
Spec Grav, UA: 1.01 (ref 1.010–1.025)
Urobilinogen, UA: 0.2 E.U./dL

## 2016-06-30 LAB — POCT HEMOGLOBIN

## 2016-06-30 LAB — POCT GLYCOSYLATED HEMOGLOBIN (HGB A1C): Hemoglobin A1C: 5.5

## 2016-06-30 MED ORDER — NITROFURANTOIN MONOHYD MACRO 100 MG PO CAPS: 100.0000 mg | ORAL_CAPSULE | Freq: Two times a day (BID) | ORAL | 1 refills | Status: DC

## 2016-06-30 MED ORDER — AMLODIPINE BESYLATE 5 MG PO TABS: 5.0000 mg | ORAL_TABLET | Freq: Every day | ORAL | 2 refills | Status: DC

## 2016-06-30 NOTE — Patient Instructions (Signed)
[name] , Thank you for taking time to come for your Medicare Wellness Visit. I appreciate your ongoing commitment to your health goals. Please review the following plan we discussed and let me know if I can assist you in the future.   Screening recommendations/referrals: Colonoscopy: completed 08/10/12, due 08/2022 Mammogram: completed 05/18/16, due 05/2017 Bone Density: completed 07/22/11, due 07/2021 Recommended yearly ophthalmology/optometry visit for glaucoma screening and checkup Recommended yearly dental visit for hygiene and checkup  Vaccinations: Influenza vaccine: declined, due 11/2016 Pneumococcal vaccine: completed series Tdap vaccine: completed 07/04/12, due 06/2022 Shingles vaccine: declined   Advanced directives: Please bring a copy of your POA (Power of [place]) and/or Living Will to your next appointment.   Conditions/risks identified: Increase exercise. Recommend increasing exercise to 3 times weekly for 20 minutes.   Next appointment: None, need to schedule 1 year AWV.   Preventive Care 42 Years and Older, Female Preventive care refers to lifestyle choices and visits with your health care provider that can promote health and wellness. What does preventive care include?  A yearly physical exam. This is also called an annual well check.  Dental exams once or twice a year.  Routine eye exams. Ask your health care provider how often you should have your eyes checked.  Personal lifestyle choices, including:  Daily care of your teeth and gums.  Regular physical activity.  Eating a healthy diet.  Avoiding tobacco and drug use.  Limiting alcohol use.  Practicing safe sex.  Taking low-dose aspirin every day.  Taking vitamin and mineral supplements as recommended by your health care provider. What happens during an annual well check? The services and screenings done by your health care provider during your annual well check will depend on your age,  overall health, lifestyle risk factors, and family history of disease. Counseling  Your health care provider may ask you questions about your:  Alcohol use.  Tobacco use.  Drug use.  Emotional well-being.  Home and relationship well-being.  Sexual activity.  Eating habits.  History of falls.  Memory and ability to understand (cognition).  Work and work Statistician.  Reproductive health. Screening  You may have the following tests or measurements:  Height, weight, and BMI.  Blood pressure.  Lipid and cholesterol levels. These may be checked every 5 years, or more frequently if you are over 67 years old.  Skin check.  Lung cancer screening. You may have this screening every year starting at age 94 if you have a 30-pack-year history of smoking and currently smoke or have quit within the past 15 years.  Fecal occult blood test (FOBT) of the stool. You may have this test every year starting at age 65.  Flexible sigmoidoscopy or colonoscopy. You may have a sigmoidoscopy every 5 years or a colonoscopy every 10 years starting at age 70.  Hepatitis C blood test.  Hepatitis B blood test.  Sexually transmitted disease (STD) testing.  Diabetes screening. This is done by checking your blood sugar (glucose) after you have not eaten for a while (fasting). You may have this done every 1-3 years.  Bone density scan. This is done to screen for osteoporosis. You may have this done starting at age 31.  Mammogram. This may be done every 1-2 years. Talk to your health care provider about how often you should have regular mammograms. Talk with your health care provider about your test results, treatment options, and if necessary, the need for more tests. Vaccines  Your health care provider  may recommend certain vaccines, such as:  Influenza vaccine. This is recommended every year.  Tetanus, diphtheria, and acellular pertussis (Tdap, Td) vaccine. You may need a Td booster every 10  years.  Zoster vaccine. You may need this after age 69.  Pneumococcal 13-valent conjugate (PCV13) vaccine. One dose is recommended after age 39.  Pneumococcal polysaccharide (PPSV23) vaccine. One dose is recommended after age 6. Talk to your health care provider about which screenings and vaccines you need and how often you need them. This information is not intended to replace advice given to you by your health care provider. Make sure you discuss any questions you have with your health care provider. Document Released: 03/21/2015 Document Revised: 11/12/2015 Document Reviewed: 12/24/2014 Elsevier Interactive Patient Education  2017 [place] Prevention in the Home Falls can cause injuries. They can happen to people of all ages. There are many things you can do to make your home safe and to help prevent falls. What can I do on the outside of my home?  Regularly fix the edges of walkways and driveways and fix any cracks.  Remove anything that might make you trip as you walk through a door, such as a raised step or threshold.  Trim any bushes or trees on the path to your home.  Use bright outdoor lighting.  Clear any walking paths of anything that might make someone trip, such as rocks or tools.  Regularly check to see if handrails are loose or broken. Make sure that both sides of any steps have handrails.  Any raised decks and porches should have guardrails on the edges.  Have any leaves, snow, or ice cleared regularly.  Use sand or salt on walking paths during winter.  Clean up any spills in your garage right away. This includes oil or grease spills. What can I do in the bathroom?  Use night lights.  Install grab bars by the toilet and in the tub and shower. Do not use towel bars as grab bars.  Use non-skid mats or decals in the tub or shower.  If you need to sit down in the shower, use a plastic, non-slip stool.  Keep the floor dry. Clean up any water that  spills on the floor as soon as it happens.  Remove soap buildup in the tub or shower regularly.  Attach bath mats securely with double-sided non-slip rug tape.  Do not have throw rugs and other things on the floor that can make you trip. What can I do in the bedroom?  Use night lights.  Make sure that you have a light by your bed that is easy to reach.  Do not use any sheets or blankets that are too big for your bed. They should not hang down onto the floor.  Have a firm chair that has side arms. You can use this for support while you get dressed.  Do not have throw rugs and other things on the floor that can make you trip. What can I do in the kitchen?  Clean up any spills right away.  Avoid walking on wet floors.  Keep items that you use a lot in easy-to-reach places.  If you need to reach something above you, use a strong step stool that has a grab bar.  Keep electrical cords out of the way.  Do not use floor polish or wax that makes floors slippery. If you must use wax, use non-skid floor wax.  Do not have throw rugs  and other things on the floor that can make you trip. What can I do with my stairs?  Do not leave any items on the stairs.  Make sure that there are handrails on both sides of the stairs and use them. Fix handrails that are broken or loose. Make sure that handrails are as long as the stairways.  Check any carpeting to make sure that it is firmly attached to the stairs. Fix any carpet that is loose or worn.  Avoid having throw rugs at the top or bottom of the stairs. If you do have throw rugs, attach them to the floor with carpet tape.  Make sure that you have a light switch at the top of the stairs and the bottom of the stairs. If you do not have them, ask someone to add them for you. What else can I do to help prevent falls?  Wear shoes that:  Do not have high heels.  Have rubber bottoms.  Are comfortable and fit you well.  Are closed at the  toe. Do not wear sandals.  If you use a stepladder:  Make sure that it is fully opened. Do not climb a closed stepladder.  Make sure that both sides of the stepladder are locked into place.  Ask someone to hold it for you, if possible.  Clearly mark and make sure that you can see:  Any grab bars or handrails.  First and last steps.  Where the edge of each step is.  Use tools that help you move around (mobility aids) if they are needed. These include:  Canes.  Walkers.  Scooters.  Crutches.  Turn on the lights when you go into a dark area. Replace any light bulbs as soon as they burn out.  Set up your furniture so you have a clear path. Avoid moving your furniture around.  If any of your floors are uneven, fix them.  If there are any pets around you, be aware of where they are.  Review your medicines with your doctor. Some medicines can make you feel dizzy. This can increase your chance of falling. Ask your doctor what other things that you can do to help prevent falls. This information is not intended to replace advice given to you by your health care provider. Make sure you discuss any questions you have with your health care provider. Document Released: 12/19/2008 Document Revised: 07/31/2015 Document Reviewed: 03/29/2014 Elsevier Interactive Patient Education  2017 [employer].

## 2016-06-30 NOTE — Progress Notes (Signed)
Is a islaboratory work remained stable5is ahe isa list of home he   Subjective:  HPI  Hypertension, follow-up:  BP Readings from Last 3 Encounters:  06/30/16 (!) 172/94  06/30/16 (!) 186/98  03/29/16 (!) 160/80    She was last seen for hypertension 3 months ago.  BP at that visit was 160/80. Management since that visit includes none. She reports good compliance with treatment. She is not having side effects.  She is not exercising. She is adherent to low salt diet.   Outside blood pressures are running higher. She is experiencing dizziness.  Patient denies chest pain, chest pressure/discomfort, claudication, dyspnea, exertional chest pressure/discomfort, fatigue, irregular heart beat, lower extremity edema, near-syncope, orthopnea, palpitations, paroxysmal nocturnal dyspnea and syncope.   Cardiovascular risk factors include advanced age (older than 42 for men, 70 for women), diabetes mellitus and hypertension.  Wt Readings from Last 3 Encounters:  06/30/16 150 lb 12.8 oz (68.4 kg)  06/30/16 150 lb 12.8 oz (68.4 kg)  03/29/16 152 lb (68.9 kg)   ------------------------------------------------------------------------   Diabetes Mellitus Type II, Follow-up:   Lab Results  Component Value Date   HGBA1C 6.1 (H) 02/04/2016   HGBA1C 5.8 (H) 05/01/2015   HGBA1C 5.5 03/22/2012    Last seen for diabetes 3 months ago.  Management since then includes none. Home blood sugar records: not being checked  Episodes of hypoglycemia? none   Current exercise: none  Pertinent Labs:    Component Value Date/Time   CHOL 182 03/30/2016 1001   CHOL 163 03/22/2012 0400   TRIG 103 03/30/2016 1001   TRIG 75 03/22/2012 0400   HDL 44 03/30/2016 1001   HDL 39 (L) 03/22/2012 0400   LDLCALC 117 (H) 03/30/2016 1001   LDLCALC 109 (H) 03/22/2012 0400   CREATININE 0.97 03/30/2016 1001   CREATININE 1.27 01/17/2014 1520    Wt Readings from Last 3 Encounters:  06/30/16 150 lb 12.8 oz (68.4  kg)  06/30/16 150 lb 12.8 oz (68.4 kg)  03/29/16 152 lb (68.9 kg)    ------------------------------------------------------------------------    Prior to Admission medications   Medication Sig Start Date End Date Taking? Authorizing Provider  aspirin EC 81 MG tablet Take 81 mg by mouth daily.    Historical Provider, MD  Clobetasol Prop Emollient Base (CLOBETASOL PROPIONATE E) 0.05 % emollient cream Apply 1 g topically daily. Per vagina/ urethra Patient taking differently: Apply 1 g topically daily. Per vagina/ urethra 04/28/15   Hollice Espy, MD  ferrous sulfate 325 (65 FE) MG tablet Take 1 tablet (325 mg total) by mouth daily. 12/11/14   Richard Maceo Pro., MD  lisinopril (PRINIVIL,ZESTRIL) 10 MG tablet Take 1 tablet by mouth daily.    Historical Provider, MD    Patient Active Problem List   Diagnosis Date Noted  . Gastric ulcer requiring drug therapy, chronic 01/23/2016  . MI (mitral incompetence) 07/22/2015  . Combined fat and carbohydrate induced hyperlipemia 12/26/2014  . UTI (lower urinary tract infection) 08/18/2014  . Blurry vision 08/18/2014  . Allergic rhinitis 07/12/2014  . Absolute anemia 07/12/2014  . Baker's cyst of knee 07/12/2014  . Atherosclerosis of coronary artery 07/12/2014  . CAFL (chronic airflow limitation) ([place]) 07/12/2014  . Dizziness 07/12/2014  . Essential (primary) hypertension 07/12/2014  . Acid reflux 07/12/2014  . 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 History of colon polyps 07/12/2014  . Hypercholesteremia 07/12/2014  . Malaise and fatigue 07/12/2014  . Heart attack ([place]) 07/12/2014  . Muscle ache 07/12/2014  . Arthritis,  degenerative 07/12/2014  . Peripheral blood vessel disorder ([place]) 07/12/2014  . Breath shortness 07/12/2014  . Bradycardia 08/23/2013  . Arteriosclerosis of coronary artery 08/17/2013  . Diabetes ([place]) 08/17/2013  . Diabetes mellitus ([place]) 08/17/2013  . Peripheral vascular disease ([place]) 08/17/2013    Past Medical  History:  Diagnosis Date  . GERD (gastroesophageal reflux disease)   . Hyperlipidemia   . Hypertension   . MI (myocardial infarction) [facility])     Social History   Social History  . Marital status: Married    Spouse name: [name]  . Number of children: 2  . Years of education: N/A   Occupational History  . retired    Social History Main Topics  . Smoking status: Former Smoker    Packs/day: 0.25    Years: 10.00    Types: Cigarettes    Quit date: 03/06/1998  . Smokeless tobacco: Never Used  . Alcohol use No  . Drug use: No  . Sexual activity: Yes    Birth control/ protection: Surgical   Other Topics Concern  . Not on file   Social History Narrative  . No narrative on file    Allergies  Allergen Reactions  . Celebrex  [Celecoxib]     Dark stools.  . Penicillins Other (See Comments)    Childhood reaction  . Prevacid  [Lansoprazole] Diarrhea    Review of Systems  Constitutional: Negative.   HENT: Negative.   Eyes: Negative.   Respiratory: Negative.   Cardiovascular: Negative.   Gastrointestinal: Negative.   Genitourinary: Negative.   Musculoskeletal: Negative.   Skin: Negative.   Neurological: Positive for dizziness.  Endo/Heme/Allergies: Negative.   Psychiatric/Behavioral: Negative.     Immunization History  Administered Date(s) Administered  . Pneumococcal Conjugate-13 02/06/2014  . Pneumococcal Polysaccharide-23 07/04/2012  . Tdap 07/04/2012  . Zoster 07/04/2012    Objective:  BP (!) 172/94   Pulse 72   Temp 98.6 F (37 C) (Oral)   Wt 150 lb 12.8 oz (68.4 kg)   BMI 25.09 kg/m   Physical Exam  Constitutional: She is oriented to person, place, and time and well-developed, well-nourished, and in no distress.  HENT:  Head: Normocephalic and atraumatic.  Right Ear: External ear normal.  Left Ear: External ear normal.  Nose: Nose normal.  Mouth/Throat: Oropharynx is clear and moist.  Eyes: Conjunctivae are normal. No scleral icterus.  Neck: No  thyromegaly present.  Cardiovascular: Normal rate, regular rhythm and normal heart sounds.   Pulmonary/Chest: Effort normal and breath sounds normal.  Abdominal: Soft.  Neurological: She is alert and oriented to person, place, and time. Gait normal. GCS score is 15.  Skin: Skin is warm and dry.  Psychiatric: Mood, memory, affect and judgment normal.    Lab Results  Component Value Date   WBC 5.1 03/30/2016   HGB 8.6 (L) 12/08/2014   HCT 40.0 03/30/2016   PLT 231 03/30/2016   GLUCOSE 88 03/30/2016   CHOL 182 03/30/2016   TRIG 103 03/30/2016   HDL 44 03/30/2016   LDLCALC 117 (H) 03/30/2016   TSH 1.940 03/30/2016   HGBA1C 6.1 (H) 02/04/2016    CMP     Component Value Date/Time   NA 145 (H) 03/30/2016 1001   NA 144 12/31/2013 0056   K 4.0 03/30/2016 1001   K 4.1 12/31/2013 0056   CL 103 03/30/2016 1001   CL 108 (H) 12/31/2013 0056   CO2 26 03/30/2016 1001   CO2 30 12/31/2013 0056   GLUCOSE  88 03/30/2016 1001   GLUCOSE 96 12/08/2014 1748   GLUCOSE 101 (H) 12/31/2013 0056   BUN 14 03/30/2016 1001   BUN 18 12/31/2013 0056   CREATININE 0.97 03/30/2016 1001   CREATININE 1.27 01/17/2014 1520   CALCIUM 9.2 03/30/2016 1001   CALCIUM 8.1 (L) 12/31/2013 0056   PROT 6.2 03/30/2016 1001   PROT 8.1 03/21/2012 1953   ALBUMIN 3.9 03/30/2016 1001   ALBUMIN 4.1 03/21/2012 1953   AST 17 03/30/2016 1001   AST 19 03/21/2012 1953   ALT 12 03/30/2016 1001   ALT 18 03/21/2012 1953   ALKPHOS 66 03/30/2016 1001   ALKPHOS 74 03/21/2012 1953   BILITOT 0.6 03/30/2016 1001   BILITOT 0.4 03/21/2012 1953   GFRNONAA 57 (L) 03/30/2016 1001   GFRNONAA 44 (L) 01/17/2014 1520   GFRNONAA 59 (L) 03/25/2012 0611   GFRAA 65 03/30/2016 1001   GFRAA 53 (L) 01/17/2014 1520   GFRAA >60 03/25/2012 0611    Assessment and Plan :  1. Essential (primary) hypertension Elevated today. Start Amlodipine. Follow up in 1 month.  - amLODipine (NORVASC) 5 MG tablet; Take 1 tablet (5 mg total) by mouth daily.   Dispense: 30 tablet; Refill: 2  2. Anemia, unspecified type   3. Dizziness   4. Hyperglycemia  - POCT hemoglobin 5.5 today stable.   5. Urinary frequency  - POCT urinalysis dipstick  6. Acute cystitis without hematuria  - nitrofurantoin, macrocrystal-monohydrate, (MACROBID) 100 MG capsule; Take 1 capsule (100 mg total) by mouth 2 (two) times daily.  Dispense: 6 capsule; Refill: 1 - Urine culture   HPI, Exam, and A&P Transcribed under the direction and in the presence of Richard L. Cranford Mon, MD  Electronically Signed: Katina Dung, CMA I have done the exam and reviewed the above chart and it is accurate to the best of my knowledge. Development worker, community has been used in this note in any air is in the dictation or transcription are unintentional.  [employer] 06/30/2016 3:57 PM

## 2016-06-30 NOTE — Progress Notes (Signed)
Subjective:   [name] [name] is a 76 y.o. female who presents for Medicare Annual (Subsequent) preventive examination.  Review of Systems:  N/A Cardiac Risk Factors include: advanced age (>43men, >78 women);diabetes mellitus;hypertension;dyslipidemia     Objective:     Vitals: BP (!) 186/98 (BP Location: Right Arm)   Pulse 72   Temp 98.6 F (37 C) (Oral)   Ht 5\' 5"  (1.651 m)   Wt 150 lb 12.8 oz (68.4 kg)   BMI 25.09 kg/m   Body mass index is 25.09 kg/m.   Tobacco History  Smoking Status  . Former Smoker  . Packs/day: 0.25  . Years: 10.00  . Types: Cigarettes  . Quit date: 03/06/1998  Smokeless Tobacco  . Never Used     Counseling given: Not Answered   Past Medical History:  Diagnosis Date  . GERD (gastroesophageal reflux disease)   . Hyperlipidemia   . Hypertension   . MI (myocardial infarction) [facility])    Past Surgical History:  Procedure Laterality Date  . ABDOMINAL HYSTERECTOMY    . APPENDECTOMY    . CORONARY ANGIOPLASTY WITH STENT PLACEMENT    . CYSTOSCOPY    . TONSILLECTOMY     Family History  Problem Relation Age of Onset  . Hyperlipidemia Mother   . Allergies Mother   . Cerebral aneurysm Mother     cause of death at age 39  . Heart disease Father     Fatal MI ag 8  . Cancer Brother     lung cancer  . Hyperlipidemia Brother   . Colonic polyp Brother   . Healthy Son   . Cancer Brother   . Healthy Son    History  Sexual Activity  . Sexual activity: Yes  . Birth control/ protection: Surgical    Outpatient Encounter Prescriptions as of 06/30/2016  Medication Sig  . aspirin EC 81 MG tablet Take 81 mg by mouth daily.  . Clobetasol Prop Emollient Base (CLOBETASOL PROPIONATE E) 0.05 % emollient cream Apply 1 g topically daily. Per vagina/ urethra (Patient taking differently: Apply 1 g topically daily. Per vagina/ urethra)  . ferrous sulfate 325 (65 FE) MG tablet Take 1 tablet (325 mg total) by mouth daily.  Marland Kitchen lisinopril  (PRINIVIL,ZESTRIL) 10 MG tablet Take 1 tablet by mouth daily.   No facility-administered encounter medications on file as of 06/30/2016.     Activities of Daily Living In your present state of health, do you have any difficulty performing the following activities: 06/30/2016 02/04/2016  Hearing? N N  Vision? N N  Difficulty concentrating or making decisions? N N  Walking or climbing stairs? N N  Dressing or bathing? N N  Doing errands, shopping? N N  Preparing Food and eating ? N -  Using the Toilet? N -  In the past six months, have you accidently leaked urine? N -  Do you have problems with loss of bowel control? N -  Managing your Medications? N -  Managing your Finances? N -  Housekeeping or managing your Housekeeping? N -  Some recent data might be hidden    Patient Care Team: Jerrol Banana., MD as PCP - General (Family Medicine) Corey Skains, MD as Consulting Physician (Cardiology) Odette Fraction as Consulting Physician (Optometry) Oneta Rack, MD as Consulting Physician (Dermatology)    Assessment:     Exercise Activities and Dietary recommendations Current Exercise Habits: Home exercise routine, Type of exercise: walking, Time (Minutes): 20, Frequency (  Times/Week): 1 (sometimes twice a week), Weekly Exercise (Minutes/Week): 20, Intensity: Mild, Exercise limited by: None identified  Goals    . Exercise           Recommend increasing exercise every week. Pt to start walking 3 times a week for at least 20 minutes.       Fall Risk Fall Risk  06/30/2016 02/04/2016 08/28/2014  Falls in the past year? No No No   Depression Screen PHQ 2/9 Scores 06/30/2016 06/30/2016 02/04/2016 08/28/2014  PHQ - 2 Score 0 0 0 0  PHQ- 9 Score 0 - - -     Cognitive Function     6CIT Screen 06/30/2016  What Year? 0 points  What month? 0 points  What time? 0 points  Count back from 20 0 points  Months in reverse 0 points  Repeat phrase 2 points  Total Score 2     Immunization History  Administered Date(s) Administered  . Pneumococcal Conjugate-13 02/06/2014  . Pneumococcal Polysaccharide-23 07/04/2012  . Tdap 07/04/2012  . Zoster 07/04/2012   Screening Tests Health Maintenance  Topic Date Due  . FOOT EXAM  11/17/1950  . HEMOGLOBIN A1C  08/03/2016  . INFLUENZA VACCINE  10/06/2016  . OPHTHALMOLOGY EXAM  01/06/2017  . TETANUS/TDAP  07/05/2022  . DEXA SCAN  Completed  . PNA vac Low Risk Adult  Completed      Plan:  I have personally reviewed and addressed the Medicare Annual Wellness questionnaire and have noted the following in the patient's chart:  A. Medical and social history B. Use of alcohol, tobacco or illicit drugs  C. Current medications and supplements D. Functional ability and status E.  Nutritional status F.  Physical activity G. Advance directives H. List of other physicians I.  Hospitalizations, surgeries, and ER visits in previous 12 months J.  [place] such as hearing and vision if needed, cognitive and depression L. Referrals and appointments - none  In addition, I have reviewed and discussed with patient certain preventive protocols, quality metrics, and best practice recommendations. A written personalized care plan for preventive services as well as general preventive health recommendations were provided to patient.  See attached scanned questionnaire for additional information.   Signed,  Fabio Neighbors, LPN Nurse Health Advisor   MD Recommendations: Pt needs a diabetic foot exam. Pt states she is not a diabetic. I have reviewed the health advisors note, was  available for consultation and I agree with documentation and plan. Miguel Aschoff MD [facility]

## 2016-07-02 LAB — URINE CULTURE: Organism ID, Bacteria: NO GROWTH

## 2016-07-06 ENCOUNTER — Ambulatory Visit: Payer: Commercial Managed Care - HMO

## 2016-07-23 ENCOUNTER — Ambulatory Visit
Admission: RE | Admit: 2016-07-23 | Discharge: 2016-07-23 | Disposition: A | Discharge status: Home / Self Care | Payer: PPO | Source: Ambulatory Visit | Attending: Family Medicine | Admitting: Family Medicine

## 2016-07-23 DIAGNOSIS — N6312 Unspecified lump in the right breast, upper inner quadrant: Secondary | ICD-10-CM | POA: Diagnosis not present

## 2016-07-23 DIAGNOSIS — R928 Other abnormal and inconclusive findings on diagnostic imaging of breast: Secondary | ICD-10-CM

## 2016-07-23 DIAGNOSIS — C50211 Malignant neoplasm of upper-inner quadrant of right female breast: Secondary | ICD-10-CM | POA: Diagnosis not present

## 2016-07-23 DIAGNOSIS — N6310 Unspecified lump in the right breast, unspecified quadrant: Secondary | ICD-10-CM

## 2016-07-23 DIAGNOSIS — Z170 Estrogen receptor positive status [ER+]: Secondary | ICD-10-CM | POA: Insufficient documentation

## 2016-07-23 DIAGNOSIS — C801 Malignant (primary) neoplasm, unspecified: Secondary | ICD-10-CM

## 2016-07-23 HISTORY — DX: Malignant (primary) neoplasm, unspecified: C80.1

## 2016-07-23 HISTORY — PX: BREAST BIOPSY: SHX20

## 2016-07-26 ENCOUNTER — Telehealth: Payer: Self-pay | Admitting: Emergency Medicine

## 2016-07-26 DIAGNOSIS — C50911 Malignant neoplasm of unspecified site of right female breast: Secondary | ICD-10-CM

## 2016-07-26 NOTE — Telephone Encounter (Signed)
Cancer center called to inform us that pt had positive breast biopsy. Invasive mammary carcinoma right breast. Please call her at [phone]. I will put in referral afterwards. Thanks.

## 2016-07-26 NOTE — Telephone Encounter (Signed)
Pt advised. Please refer to dr Bary Castilla and to Oncology.

## 2016-07-26 NOTE — Telephone Encounter (Signed)
Orders placed.

## 2016-07-27 ENCOUNTER — Encounter: Payer: Self-pay | Admitting: *Deleted

## 2016-07-28 ENCOUNTER — Encounter: Payer: Self-pay | Admitting: General Surgery

## 2016-07-28 ENCOUNTER — Inpatient Hospital Stay: Payer: Self-pay

## 2016-07-28 ENCOUNTER — Ambulatory Visit (INDEPENDENT_AMBULATORY_CARE_PROVIDER_SITE_OTHER): Payer: PPO | Admitting: General Surgery

## 2016-07-28 VITALS — BP 142/78 | HR 76 | Resp 12 | Ht 65.0 in | Wt 151.0 lb

## 2016-07-28 DIAGNOSIS — Z170 Estrogen receptor positive status [ER+]: Secondary | ICD-10-CM | POA: Diagnosis not present

## 2016-07-28 DIAGNOSIS — C50211 Malignant neoplasm of upper-inner quadrant of right female breast: Secondary | ICD-10-CM | POA: Insufficient documentation

## 2016-07-28 NOTE — Patient Instructions (Signed)
The patient is aware to call back for any questions or concerns.  

## 2016-07-28 NOTE — Progress Notes (Signed)
Patient ID: [name] [name], female   DOB: 21-Jan-1941, 76 y.o.   MRN: [id number]  Chief Complaint  Patient presents with  . Breast Problem    breast cancer    HPI [name] is a 76 y.o. female.who presents for a breast evaluation. The most recent mammogram and biopsy was done on 07-23-16, showing breast cancer.  Patient does perform regular self breast checks and gets regular mammograms done.   She could not feel anything different in the breast. She is here with her husband Nyrah Demos, of 9 years. Her husband has been seen here and is having surgery at [facility] June 11 for colon mass.    HPI  Past Medical History:  Diagnosis Date  . Cancer ([place]) 07/23/2016   ER/PR pos/INVASIVE MAMMARY CARCINOMA WITH AREAS OF EXTRACELLULAR MUCIN  . GERD (gastroesophageal reflux disease)   . Hyperlipidemia   . Hypertension   . MI (myocardial infarction) [facility])     Past Surgical History:  Procedure Laterality Date  . ABDOMINAL HYSTERECTOMY    . APPENDECTOMY    . BREAST BIOPSY Right 07/23/2016   INVASIVE MAMMARY CARCINOMA WITH AREAS OF EXTRACELLULAR MUCIN  . COLONOSCOPY  2016  . CORONARY ANGIOPLASTY WITH STENT PLACEMENT    . CYSTOSCOPY    . TONSILLECTOMY      Family History  Problem Relation Age of Onset  . Hyperlipidemia Mother   . Allergies Mother   . Cerebral aneurysm Mother        cause of death at age 100  . Heart disease Father        Fatal MI ag 62  . Cancer Brother        lung cancer  . Hyperlipidemia Brother   . Colonic polyp Brother   . Healthy Son   . Cancer Brother        colon  . Healthy Son     Social History Social History  Substance Use Topics  . Smoking status: Former Smoker    Packs/day: 0.25    Years: 10.00    Types: Cigarettes    Quit date: 03/06/1998  . Smokeless tobacco: Never Used  . Alcohol use No    Allergies  Allergen Reactions  . Celebrex  [Celecoxib]     Dark stools.  . Penicillins Other (See Comments)    Childhood reaction  .  Prevacid  [Lansoprazole] Diarrhea    Current Outpatient Prescriptions  Medication Sig Dispense Refill  . amLODipine (NORVASC) 5 MG tablet Take 1 tablet (5 mg total) by mouth daily. 30 tablet 2  . aspirin EC 81 MG tablet Take 81 mg by mouth daily.    . Clobetasol Prop Emollient Base (CLOBETASOL PROPIONATE E) 0.05 % emollient cream Apply 1 g topically daily. Per vagina/ urethra (Patient taking differently: Apply 1 g topically daily. Per vagina/ urethra) 30 g 11  . ferrous sulfate 325 (65 FE) MG tablet Take 1 tablet (325 mg total) by mouth daily. 30 tablet 12  . lisinopril (PRINIVIL,ZESTRIL) 10 MG tablet Take 1 tablet by mouth daily.     No current facility-administered medications for this visit.     Review of Systems Review of Systems  Blood pressure (!) 142/78, pulse 76, resp. rate 12, height _0  (1.651 m), weight 151 lb (68.5 kg).  Physical Exam Physical Exam  Constitutional: She is oriented to person, place, and time. She appears well-developed and well-nourished.  HENT:  Mouth/Throat: Oropharynx is clear and moist.  Eyes: Conjunctivae are normal. No  scleral icterus.  Neck: Neck supple.  Cardiovascular: Normal rate, regular rhythm and normal heart sounds.   No lower leg edema  Pulmonary/Chest: Effort normal and breath sounds normal. Right breast exhibits skin change. Right breast exhibits no inverted nipple, no mass, no nipple discharge and no tenderness. Left breast exhibits no inverted nipple, no mass, no nipple discharge, no skin change and no tenderness.    Small bruise at biopsy site right breast  Lymphadenopathy:    She has no cervical adenopathy.    She has no axillary adenopathy.       Right: No supraclavicular adenopathy present.       Left: No supraclavicular adenopathy present.  Neurological: She is alert and oriented to person, place, and time.  Skin: Skin is warm and dry.  Psychiatric: Her behavior is normal.    Data Reviewed 04/27/2016 screening mammogram  suggested a density in the right breast.  Diagnostic mammogram was completed 05/18/2016 confirming above findings. Ultrasound confirmed a hypoechoic nodule suspicious for malignancy.  Ultrasound guided core biopsy dated 07/23/2016 with results below.  07/23/2016 biopsy:  A. BREAST, RIGHT 1:00, 7 CM FN; ULTRASOUND GUIDED BIOPSY:  - INVASIVE MAMMARY CARCINOMA WITH AREAS OF EXTRACELLULAR MUCIN.   Size of invasive carcinoma: 5 mm in this sample BREAST BIOMARKER TESTS  Estrogen Receptor (ER) Status: Positive, greater than 90%  Progesterone Receptor (PgR) Status: Positive, range 51-90% nuclear  staining  HER2 (by immunohistochemistry): Equivocal (2+)  Percentage of cells with uniform intense complete membrane staining: 0%  HER2 FISH is pending.   Ultrasound examination was completed to determine if preoperative needle localization would be required postbiopsy. In the upper inner quadrant of the right breast at the 8 cm mark from the nipple, 1:00 position a 0.28 cm hypoechoic masses noted. Just inferior and medial to this the clip is identified within 0.8 cm of the mass. BI-RADS-6.    Assessment    Stage I carcinoma the right breast.     Plan    The majority of the visit was spent reviewing the options for breast cancer treatment. Breast conservation with lumpectomy and radiation therapy  was presented as equivalent to mastectomy for long-term control. The pros and cons of each treatment regimen were reviewed. Based on the present tumor size even if her to positive adjuvant chemotherapy would not be recommended.  Second surgical opinion availability was discussed.  The patient appeared somewhat cool to the idea of postsurgical radiation. He reviewed that this does increase the long-term risk of recurrent disease but with antiestrogen therapy this would likely be of about 15%.  Her husband is scheduled for surgery at [facility] in the middle of June for a GI ST tumor of the proximal  jejunum. They have plans to see the plate "[place]" in [place] at the beginning of June and she'll likely postpone a decision on surgical intervention until her husband surgery is complete.  The opportunity to make use of an antiestrogen between now and surgical therapy was discussed to minimize any concern about the tumor growing during the interval of time where she has other commitments. She'll contact the office in regards to both the opportunity to begin an antiestrogen and/or to schedule surgery.       HPI, Physical Exam, Assessment and Plan have been scribed under the direction and in the presence of Robert Bellow, MD.  Karie Fetch, RN  I have completed the exam and reviewed the above documentation for accuracy and completeness.  I agree with  the above.  Haematologist has been used and any errors in dictation or transcription are unintentional.  Hervey Ard, M.D., F.A.C.S.   Robert Bellow 07/28/2016, 8:36 PM

## 2016-07-29 ENCOUNTER — Encounter: Payer: Self-pay | Admitting: Family Medicine

## 2016-07-29 ENCOUNTER — Ambulatory Visit (INDEPENDENT_AMBULATORY_CARE_PROVIDER_SITE_OTHER): Payer: PPO | Admitting: Family Medicine

## 2016-07-29 VITALS — BP 128/72 | HR 72 | Temp 98.1°F | Resp 16 | Wt 152.0 lb

## 2016-07-29 DIAGNOSIS — I10 Essential (primary) hypertension: Secondary | ICD-10-CM

## 2016-07-29 DIAGNOSIS — D649 Anemia, unspecified: Secondary | ICD-10-CM | POA: Diagnosis not present

## 2016-07-29 NOTE — Progress Notes (Signed)
Patient: [name] [name] Female    DOB: 1940-12-28   76 y.o.   MRN: [id number] Visit Date: 07/29/2016  Today's Provider: Wilhemena Durie, MD   Chief Complaint  Patient presents with  . Hypertension   Subjective:    HPI  Hypertension, follow-up:  BP Readings from Last 3 Encounters:  07/29/16 128/72  07/28/16 (!) 142/78  06/30/16 (!) 172/94    She was last seen for hypertension 1 months ago.  BP at that visit was 172/94. Management since that visit includes adding amlodipine 5mg  daily. She reports good compliance with treatment. She is not having side effects.  She is not exercising. She is adherent to low salt diet.   Outside blood pressures are checked occasionally. She is experiencing none.  Patient denies exertional chest pressure/discomfort, lower extremity edema and palpitations.    Weight trend: stable Wt Readings from Last 3 Encounters:  07/29/16 152 lb (68.9 kg)  07/28/16 151 lb (68.5 kg)  06/30/16 150 lb 12.8 oz (68.4 kg)    Current diet: well balanced        Allergies  Allergen Reactions  . Celebrex  [Celecoxib]     Dark stools.  . Penicillins Other (See Comments)    Childhood reaction  . Prevacid  [Lansoprazole] Diarrhea     Current Outpatient Prescriptions:  .  amLODipine (NORVASC) 5 MG tablet, Take 1 tablet (5 mg total) by mouth daily., Disp: 30 tablet, Rfl: 2 .  aspirin EC 81 MG tablet, Take 81 mg by mouth daily., Disp: , Rfl:  .  Clobetasol Prop Emollient Base (CLOBETASOL PROPIONATE E) 0.05 % emollient cream, Apply 1 g topically daily. Per vagina/ urethra (Patient taking differently: Apply 1 g topically daily. Per vagina/ urethra), Disp: 30 g, Rfl: 11 .  ferrous sulfate 325 (65 FE) MG tablet, Take 1 tablet (325 mg total) by mouth daily., Disp: 30 tablet, Rfl: 12 .  lisinopril (PRINIVIL,ZESTRIL) 10 MG tablet, Take 1 tablet by mouth daily., Disp: , Rfl:   Review of Systems  Constitutional: Negative.   HENT: Negative.   Eyes:  Negative.   Respiratory: Negative.   Cardiovascular: Negative.   Endocrine: Negative.   Musculoskeletal: Negative.   Allergic/Immunologic: Negative.   Neurological: Negative.   Psychiatric/Behavioral: Negative.     Social History  Substance Use Topics  . Smoking status: Former Smoker    Packs/day: 0.25    Years: 10.00    Types: Cigarettes    Quit date: 03/06/1998  . Smokeless tobacco: Never Used  . Alcohol use No   Objective:   BP 128/72 (BP Location: Right Arm, Patient Position: Sitting, Cuff Size: Normal)   Pulse 72   Temp 98.1 F (36.7 C)   Resp 16   Wt 152 lb (68.9 kg)   SpO2 99%   BMI 25.29 kg/m  Vitals:   07/29/16 1110  BP: 128/72  Pulse: 72  Resp: 16  Temp: 98.1 F (36.7 C)  SpO2: 99%  Weight: 152 lb (68.9 kg)     Physical Exam  Constitutional: She is oriented to person, place, and time. She appears well-developed and well-nourished.  HENT:  Head: Normocephalic and atraumatic.  Eyes: Conjunctivae are normal.  Neck: Neck supple.  Cardiovascular: Normal rate, regular rhythm and normal heart sounds.   Pulmonary/Chest: Effort normal and breath sounds normal.  Abdominal: Soft.  Neurological: She is alert and oriented to person, place, and time.  Skin: Skin is warm and dry.  Psychiatric: She has  a normal mood and affect. Her behavior is normal. Judgment and thought content normal.        Assessment & Plan:     1. Essential (primary) hypertension  - Comprehensive metabolic panel  2. Anemia, unspecified type  - CBC with Differential/Platelet - Iron 3.Breast Cancer For sugery next month.      Korina Tretter Cranford Mon, MD  [facility]

## 2016-07-30 ENCOUNTER — Other Ambulatory Visit: Payer: Self-pay | Admitting: General Surgery

## 2016-07-30 ENCOUNTER — Telehealth: Payer: Self-pay | Admitting: General Surgery

## 2016-07-30 ENCOUNTER — Other Ambulatory Visit: Payer: Self-pay

## 2016-07-30 DIAGNOSIS — C50211 Malignant neoplasm of upper-inner quadrant of right female breast: Secondary | ICD-10-CM

## 2016-07-30 DIAGNOSIS — Z170 Estrogen receptor positive status [ER+]: Principal | ICD-10-CM

## 2016-07-30 LAB — COMPREHENSIVE METABOLIC PANEL
ALBUMIN: 3.9 g/dL (ref 3.5–4.8)
ALK PHOS: 68 IU/L (ref 39–117)
ALT: 9 IU/L (ref 0–32)
AST: 13 IU/L (ref 0–40)
Albumin/Globulin Ratio: 1.6 (ref 1.2–2.2)
BUN/Creatinine Ratio: 20 (ref 12–28)
BUN: 18 mg/dL (ref 8–27)
Bilirubin Total: 0.7 mg/dL (ref 0.0–1.2)
CO2: 25 mmol/L (ref 18–29)
CREATININE: 0.88 mg/dL (ref 0.57–1.00)
Calcium: 9.3 mg/dL (ref 8.7–10.3)
Chloride: 105 mmol/L (ref 96–106)
GFR calc Af Amer: 73 mL/min/{1.73_m2} (ref >59)
GFR calc non Af Amer: 64 mL/min/{1.73_m2} (ref >59)
GLUCOSE: 84 mg/dL (ref 65–99)
Globulin, Total: 2.5 g/dL (ref 1.5–4.5)
Potassium: 4.4 mmol/L (ref 3.5–5.2)
Sodium: 145 mmol/L — ABNORMAL HIGH (ref 134–144)
Total Protein: 6.4 g/dL (ref 6.0–8.5)

## 2016-07-30 LAB — CBC WITH DIFFERENTIAL/PLATELET
BASOS ABS: 0.1 10*3/uL (ref 0.0–0.2)
Basos: 1 %
EOS (ABSOLUTE): 0.5 10*3/uL — ABNORMAL HIGH (ref 0.0–0.4)
Eos: 7 %
HEMOGLOBIN: 13.5 g/dL (ref 11.1–15.9)
Hematocrit: 40.7 % (ref 34.0–46.6)
IMMATURE GRANS (ABS): 0 10*3/uL (ref 0.0–0.1)
IMMATURE GRANULOCYTES: 0 %
LYMPHS ABS: 1.8 10*3/uL (ref 0.7–3.1)
LYMPHS: 26 %
MCH: 30.5 pg (ref 26.6–33.0)
MCHC: 33.2 g/dL (ref 31.5–35.7)
MCV: 92 fL (ref 79–97)
MONOCYTES: 6 %
Monocytes Absolute: 0.4 10*3/uL (ref 0.1–0.9)
NEUTROS PCT: 60 %
Neutrophils Absolute: 4.1 10*3/uL (ref 1.4–7.0)
Platelets: 240 10*3/uL (ref 150–379)
RBC: 4.42 x10E6/uL (ref 3.77–5.28)
RDW: 15.4 % (ref 12.3–15.4)
WBC: 7 10*3/uL (ref 3.4–10.8)

## 2016-07-30 LAB — IRON: IRON: 132 ug/dL (ref 27–139)

## 2016-07-30 NOTE — Progress Notes (Signed)
The patient confirmed that she has had no cardiac symptoms since her stent placement. At that time she experienced chest pain that was quite severe on 2 occasions over several hours.

## 2016-07-30 NOTE — Telephone Encounter (Signed)
The patient called with desires to have her surgery completed in early June rather than after her husband's anticipated small bowel resection scheduled for later in the month.  In phone conversation her husband will be doing the driving further planned trip to [place], and I think she'll be reasonably comfortable to make the trip just 4 days after surgery.  She has had no cardiac symptoms since her stent placement several years ago.  Her last cardiology evaluation in December 2017 with Serafina Royals, M.D. was unremarkable. She is due for follow-up in mid June.  I spoke with Dr. Alveria Apley office and have asked him to review her chart after the [place] Day holiday to determine if she needs to be seen early prior to her scheduled 08/09/2016 breast lumpectomy and sentinel node biopsy.Marland Kitchen

## 2016-08-02 NOTE — Progress Notes (Signed)
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  INTERVAL HISTORY: Patient is a 76 year old female who was noted to have an abnormality on routine screening mammogram. Subsequent ultrasound biopsy revealed the above stated breast cancer. She is anxious, but otherwise feels well. She has no neurologic complaints. She denies any recent fevers or illnesses. She has a good appetite and denies weight loss. She has no chest pain or shortness of breath. She denies any nausea, vomiting, constipation, or diarrhea. She has no urinary complaints. Patient otherwise feels well and offers no further specific complaints.  REVIEW OF SYSTEMS:   Review of Systems  Constitutional: Negative.  Negative for fever, malaise/fatigue and weight loss.  Respiratory: Negative.  Negative for cough and shortness of breath.   Cardiovascular: Negative.  Negative for chest pain and leg swelling.  Gastrointestinal: Negative.  Negative for abdominal pain.  Genitourinary: Negative.   Musculoskeletal: Negative.   Skin: Negative.  Negative for rash.  Neurological: Negative.  Negative for sensory change and weakness.  Psychiatric/Behavioral: Negative.  The patient is not nervous/anxious.     As per HPI. Otherwise, a complete review of systems is negative.  PAST MEDICAL HISTORY: Past Medical History:  Diagnosis Date  . Cancer ([place]) 07/23/2016   ER/PR  pos/INVASIVE MAMMARY CARCINOMA WITH AREAS OF EXTRACELLULAR MUCIN  . Coronary artery disease   . GERD (gastroesophageal reflux disease)   . Hyperlipidemia   . Hypertension   . MI (myocardial infarction) ([place])    2015    PAST SURGICAL HISTORY: Past Surgical History:  Procedure Laterality Date  . ABDOMINAL HYSTERECTOMY    . APPENDECTOMY    . BREAST BIOPSY Right 07/23/2016   INVASIVE MAMMARY CARCINOMA WITH AREAS OF EXTRACELLULAR MUCIN  . COLONOSCOPY  2016  . CORONARY ANGIOPLASTY WITH STENT PLACEMENT    . CYSTOSCOPY    . TONSILLECTOMY      FAMILY HISTORY: Family History  Problem Relation Age of Onset  . Hyperlipidemia Mother   . Allergies Mother   . Cerebral aneurysm Mother        cause of death at age 16  . Heart disease Father        Fatal MI ag 42  . Cancer Brother        lung cancer  . Hyperlipidemia Brother   . Colonic polyp Brother   . Healthy Son   . Cancer Brother        colon  . Healthy Son     ADVANCED DIRECTIVES (Y/N):  N  HEALTH MAINTENANCE: Social History  Substance Use Topics  . Smoking status: Former Smoker    Packs/day: 0.25    Years: 10.00    Types: Cigarettes    Quit date: 03/06/1998  . Smokeless tobacco: Never Used  . Alcohol use No     Colonoscopy:  PAP:  Bone density:  Lipid panel:  Allergies  Allergen Reactions  . Celebrex  [Celecoxib]     Dark stools.  . Penicillins Other (See Comments)    Childhood  reaction Has patient had a PCN reaction causing immediate rash, facial/tongue/throat swelling, SOB or lightheadedness with hypotension: Unknown Has patient had a PCN reaction causing severe rash involving mucus membranes or skin necrosis: Unknown Has patient had a PCN reaction that required hospitalization: Unknown Has patient had a PCN reaction occurring within the last 10 years: Unknown If all of the above answers are "NO", then may proceed with Cephalosporin use.   Marland Kitchen Prevacid  [Lansoprazole] Diarrhea    Current Outpatient  Prescriptions  Medication Sig Dispense Refill  . amLODipine (NORVASC) 5 MG tablet Take 1 tablet (5 mg total) by mouth daily. 30 tablet 2  . aspirin EC 81 MG tablet Take 81 mg by mouth daily.    . Clobetasol Prop Emollient Base (CLOBETASOL PROPIONATE E) 0.05 % emollient cream Apply 1 g topically daily. Per vagina/ urethra (Patient not taking: Reported on 08/05/2016) 30 g 11  . ferrous sulfate 325 (65 FE) MG tablet Take 1 tablet (325 mg total) by mouth daily. (Patient not taking: Reported on 08/03/2016) 30 tablet 12  . Ferrous Sulfate (IRON) 28 MG TABS Take 28 mg by mouth daily.    . fluocinonide cream (LIDEX) 9.62 % Apply 1 application topically daily as needed (rash).    Marland Kitchen lisinopril (PRINIVIL,ZESTRIL) 20 MG tablet Take 20 mg by mouth daily.    Marland Kitchen omeprazole (PRILOSEC) 20 MG capsule Take 20 mg by mouth daily as needed (indigestion).     No current facility-administered medications for this visit.     OBJECTIVE: Vitals:   08/03/16 1047  BP: (!) 143/83  Pulse: 75  Resp: 20  Temp: (!) 96.3 F (35.7 C)     Body mass index is 25.33 kg/m.    ECOG FS:0 - Asymptomatic  General: Well-developed, well-nourished, no acute distress. Eyes: Pink conjunctiva, anicteric sclera. HEENT: Normocephalic, moist mucous membranes, clear oropharnyx. Breasts: Exam deferred today. Lungs: Clear to auscultation bilaterally. Heart: Regular rate and rhythm. No rubs, murmurs, or gallops. Abdomen: Soft, nontender, nondistended. No organomegaly noted, normoactive bowel sounds. Musculoskeletal: No edema, cyanosis, or clubbing. Neuro: Alert, answering all questions appropriately. Cranial nerves grossly intact. Skin: No rashes or petechiae noted. Psych: Normal affect. Lymphatics: No cervical, calvicular, axillary or inguinal LAD.   LAB RESULTS:  Lab Results  Component Value Date   NA 145 (H) 07/29/2016   K 4.4 07/29/2016   CL 105 07/29/2016   CO2 25 07/29/2016   GLUCOSE 84 07/29/2016   BUN 18 07/29/2016    CREATININE 0.88 07/29/2016   CALCIUM 9.3 07/29/2016   PROT 6.4 07/29/2016   ALBUMIN 3.9 07/29/2016   AST 13 07/29/2016   ALT 9 07/29/2016   ALKPHOS 68 07/29/2016   BILITOT 0.7 07/29/2016   GFRNONAA 64 07/29/2016   GFRAA 73 07/29/2016    Lab Results  Component Value Date   WBC 7.0 07/29/2016   NEUTROABS 4.1 07/29/2016   HGB  06/30/2016     Comment:     entered in error   HCT 40.7 07/29/2016   MCV 92 07/29/2016   PLT 240 07/29/2016     STUDIES: US Breast Complete Uni Right Inc Axilla  Result Date: 07/28/2016 Ultrasound examination was completed to determine if preoperative needle localization would be required postbiopsy. In the upper inner quadrant of the right breast at the 8 cm mark from the nipple, 1:00 position a 0.28 cm hypoechoic masses noted. Just inferior and medial to this the clip is identified within 0.8 cm of the mass. BI-RADS-6.   Mm  Clip Placement Right  Result Date: 07/23/2016 CLINICAL DATA:  Ultrasound-guided biopsy was performed today of a 7 x 5 x 5 mm mass in the 1 o'clock position of the right breast. EXAM: DIAGNOSTIC RIGHT MAMMOGRAM POST ULTRASOUND BIOPSY COMPARISON:  Previous exam(s). FINDINGS: Mammographic images were obtained following ultrasound guided biopsy of a mass in the 1 o'clock position of the right breast. A coil shaped biopsy clip is satisfactorily positioned within the mass. IMPRESSION: Satisfactory position of coil shaped biopsy clip. Final Assessment: Post Procedure Mammograms for Marker Placement Electronically Signed   By: Curlene Dolphin M.D.   On: 07/23/2016 09:19   [place] Rt Breast Bx W Loc Dev 1st Lesion Img Bx Spec US Guide  Addendum Date: 07/26/2016   ADDENDUM REPORT: 07/26/2016 14:39 : PATHOLOGY: Right breast, 1 o'clock, 7 cm from nipple, invasive mammary carcinoma with areas of extracellular mucin. CONCORDANT: Yes Results were provided to the patient by the ordering physician's office on 07/26/2016. Patient has been referred to Dr. Dwyane Luo  office. RECOMMENDATION:  Per treatment plan. Electronically Signed   By: Franki Cabot M.D.   On: 07/26/2016 14:39   Result Date: 07/26/2016 CLINICAL DATA:  Ultrasound-guided right breast biopsy was recommended of a mass in the 1 o'clock position. EXAM: ULTRASOUND GUIDED RIGHT BREAST CORE NEEDLE BIOPSY COMPARISON:  Previous exam(s). FINDINGS: I met with the patient and we discussed the procedure of ultrasound-guided biopsy, including benefits and alternatives. We discussed the high likelihood of a successful procedure. We discussed the risks of the procedure, including infection, bleeding, tissue injury, clip migration, and inadequate sampling. Informed written consent was given. The usual time-out protocol was performed immediately prior to the procedure. Lesion quadrant: Upper inner quadrant Using sterile technique and 1% Lidocaine as local anesthetic, under direct ultrasound visualization, a 12 gauge spring-loaded device was used to perform biopsy of a 7 x 5 x 5 mm mass using a lateral to medial approach. At the conclusion of the procedure a coil shaped tissue marker clip was deployed into the biopsy cavity. Follow up 2 view mammogram was performed and dictated separately. IMPRESSION: Ultrasound guided biopsy of the right breast. No apparent complications. Electronically Signed: By: Curlene Dolphin M.D. 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  PLAN:    1. 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 Given the size of tumor seen on mammogram, it is unlikely she will require adjuvant chemotherapy. Although a final decision will be made postoperatively and went her HER-2 FISH results are finalized. She will definitely benefit from adjuvant XRT as well as an aromatase inhibitor for total  5 years given ER/PR status of her tumor. Return to clinic one to 2 weeks after her surgery to discuss her final pathology results as well as have consultation with radiation oncology.  Approximately 60 minutes was spent in discussion of which greater than 50% was consultation.  Patient expressed understanding and was in agreement with this plan. She also understands that She can call clinic at any time with any questions, concerns, or complaints.   Cancer Staging Malignant neoplasm of upper-inner quadrant of right breast in female, estrogen receptor positive ([place]) Staging form: Breast, AJCC 8th Edition - Clinical stage from 08/02/2016: Stage IA (cT1b, cN0, cM0, G2, ER: Positive, PR: Positive, HER2: Equivocal) - Signed  by Lloyd Huger, MD on 08/02/2016   Lloyd Huger, MD   08/07/2016 6:41 AM

## 2016-08-03 ENCOUNTER — Inpatient Hospital Stay: Payer: PPO | Attending: Oncology | Admitting: Oncology

## 2016-08-03 VITALS — BP 143/83 | HR 75 | Temp 96.3°F | Resp 20 | Wt 152.2 lb

## 2016-08-03 DIAGNOSIS — Z170 Estrogen receptor positive status [ER+]: Secondary | ICD-10-CM | POA: Insufficient documentation

## 2016-08-03 DIAGNOSIS — Z880 Allergy status to penicillin: Secondary | ICD-10-CM | POA: Diagnosis not present

## 2016-08-03 DIAGNOSIS — I2510 Atherosclerotic heart disease of native coronary artery without angina pectoris: Secondary | ICD-10-CM | POA: Diagnosis not present

## 2016-08-03 DIAGNOSIS — I252 Old myocardial infarction: Secondary | ICD-10-CM | POA: Diagnosis not present

## 2016-08-03 DIAGNOSIS — K219 Gastro-esophageal reflux disease without esophagitis: Secondary | ICD-10-CM

## 2016-08-03 DIAGNOSIS — Z7982 Long term (current) use of aspirin: Secondary | ICD-10-CM | POA: Diagnosis not present

## 2016-08-03 DIAGNOSIS — Z79899 Other long term (current) drug therapy: Secondary | ICD-10-CM | POA: Insufficient documentation

## 2016-08-03 DIAGNOSIS — I10 Essential (primary) hypertension: Secondary | ICD-10-CM | POA: Diagnosis not present

## 2016-08-03 DIAGNOSIS — C50211 Malignant neoplasm of upper-inner quadrant of right female breast: Secondary | ICD-10-CM

## 2016-08-03 DIAGNOSIS — E785 Hyperlipidemia, unspecified: Secondary | ICD-10-CM | POA: Diagnosis not present

## 2016-08-03 DIAGNOSIS — Z87891 Personal history of nicotine dependence: Secondary | ICD-10-CM | POA: Diagnosis not present

## 2016-08-03 NOTE — Progress Notes (Signed)
Patient here today for initial evaluation regarding breast cancer, states she is scheduled for surgery on 6/4.

## 2016-08-04 ENCOUNTER — Telehealth: Payer: Self-pay

## 2016-08-04 NOTE — Telephone Encounter (Signed)
Spoke with the patient about her surgery. The patient is scheduled for surgery at [facility] on 08/09/16. She will pre admit on 08/05/16 at 10:45 am at the hospital. Surgery instructions reviewed and mailed to the patient. The patient is aware of dates, time, and instructions.

## 2016-08-05 ENCOUNTER — Encounter
Admission: RE | Admit: 2016-08-05 | Discharge: 2016-08-05 | Disposition: A | Discharge status: Home / Self Care | Payer: PPO | Source: Ambulatory Visit | Attending: General Surgery | Admitting: General Surgery

## 2016-08-05 DIAGNOSIS — I739 Peripheral vascular disease, unspecified: Secondary | ICD-10-CM | POA: Diagnosis not present

## 2016-08-05 DIAGNOSIS — Z01818 Encounter for other preprocedural examination: Secondary | ICD-10-CM | POA: Diagnosis not present

## 2016-08-05 DIAGNOSIS — I2510 Atherosclerotic heart disease of native coronary artery without angina pectoris: Secondary | ICD-10-CM | POA: Diagnosis not present

## 2016-08-05 DIAGNOSIS — I10 Essential (primary) hypertension: Secondary | ICD-10-CM | POA: Diagnosis not present

## 2016-08-05 DIAGNOSIS — I340 Nonrheumatic mitral (valve) insufficiency: Secondary | ICD-10-CM | POA: Diagnosis not present

## 2016-08-05 HISTORY — DX: Atherosclerotic heart disease of native coronary artery without angina pectoris: I25.10

## 2016-08-05 NOTE — Patient Instructions (Signed)
  Your procedure is scheduled on: 08/09/16 Report to NUCLEAR MEDICINE IN THE MEDICAL MALL AT 1100 AM  Remember: Instructions that are not followed completely may result in serious medical risk, up to and including death, or upon the discretion of your surgeon and anesthesiologist your surgery may need to be rescheduled.    _x___ 1. Do not eat food or drink liquids after midnight. No gum chewing or                              hard candies.     __x__ 2. No Alcohol for 24 hours before or after surgery.   __x__3. No Smoking for 24 prior to surgery.   ____  4. Bring all medications with you on the day of surgery if instructed.    __x__ 5. Notify your doctor if there is any change in your medical condition     (cold, fever, infections).     Do not wear jewelry, make-up, hairpins, clips or nail polish.  Do not wear lotions, powders, or perfumes. You may wear deodorant.  Do not shave 48 hours prior to surgery. Men may shave face and neck.  Do not bring valuables to the hospital.    [facility] is not responsible for any belongings or valuables.               Contacts, dentures or bridgework may not be worn into surgery.  Leave your suitcase in the car. After surgery it may be brought to your room.  For patients admitted to the hospital, discharge time is determined by your                       treatment team.   Patients discharged the day of surgery will not be allowed to drive home.  You will need someone to drive you home and stay with you the night of your procedure.    Please read over the following fact sheets that you were given:   [facility] Preparing for Surgery and or MRSA Information   _x___ Take anti-hypertensive (unless it includes a diuretic), cardiac, seizure, asthma,     anti-reflux and psychiatric medicines. These include:  1. AMLODIPINE  2. LISINOPRIL  3. OMEPRAZOLE  4.  5.  6.  ____Fleets enema or Magnesium Citrate as directed.   _x___ Use CHG Soap or sage wipes as  directed on instruction sheet   ____ Use inhalers on the day of surgery and bring to hospital day of surgery  ____ Stop Metformin and Janumet 2 days prior to surgery.    ____ Take 1/2 of usual insulin dose the night before surgery and none on the morning     surgery.   ____ Follow recommendations from Cardiologist, Pulmonologist or PCP regarding          stopping Aspirin, Coumadin, Pllavix ,Eliquis, Effient, or Pradaxa, and Pletal.  ____Stop Anti-inflammatories such as Advil, Aleve, Ibuprofen, Motrin, Naproxen, Naprosyn, Goodies powders or aspirin products. OK to take Tylenol and                          Celebrex.   ____ Stop supplements until after surgery.  But may continue Vitamin D, Vitamin B,       and multivitamin.   ____ Bring C-Pap to the hospital.

## 2016-08-09 ENCOUNTER — Encounter
Admission: RE | Disposition: A | Discharge status: Home / Self Care | Payer: Self-pay | Source: Ambulatory Visit | Attending: General Surgery

## 2016-08-09 ENCOUNTER — Ambulatory Visit
Admission: RE | Admit: 2016-08-09 | Discharge: 2016-08-09 | Disposition: A | Discharge status: Home / Self Care | Payer: PPO | Source: Ambulatory Visit | Attending: General Surgery | Admitting: General Surgery

## 2016-08-09 ENCOUNTER — Ambulatory Visit: Payer: PPO | Admitting: Certified Registered Nurse Anesthetist

## 2016-08-09 ENCOUNTER — Encounter: Payer: Self-pay | Admitting: *Deleted

## 2016-08-09 ENCOUNTER — Ambulatory Visit: Payer: PPO

## 2016-08-09 ENCOUNTER — Encounter: Payer: Self-pay | Admitting: Oncology

## 2016-08-09 DIAGNOSIS — C50211 Malignant neoplasm of upper-inner quadrant of right female breast: Secondary | ICD-10-CM

## 2016-08-09 DIAGNOSIS — Z7982 Long term (current) use of aspirin: Secondary | ICD-10-CM | POA: Diagnosis not present

## 2016-08-09 DIAGNOSIS — I739 Peripheral vascular disease, unspecified: Secondary | ICD-10-CM | POA: Diagnosis not present

## 2016-08-09 DIAGNOSIS — Z880 Allergy status to penicillin: Secondary | ICD-10-CM | POA: Insufficient documentation

## 2016-08-09 DIAGNOSIS — I252 Old myocardial infarction: Secondary | ICD-10-CM | POA: Insufficient documentation

## 2016-08-09 DIAGNOSIS — Z886 Allergy status to analgesic agent status: Secondary | ICD-10-CM | POA: Diagnosis not present

## 2016-08-09 DIAGNOSIS — K219 Gastro-esophageal reflux disease without esophagitis: Secondary | ICD-10-CM | POA: Diagnosis not present

## 2016-08-09 DIAGNOSIS — I10 Essential (primary) hypertension: Secondary | ICD-10-CM | POA: Insufficient documentation

## 2016-08-09 DIAGNOSIS — Z955 Presence of coronary angioplasty implant and graft: Secondary | ICD-10-CM | POA: Diagnosis not present

## 2016-08-09 DIAGNOSIS — Z79899 Other long term (current) drug therapy: Secondary | ICD-10-CM | POA: Diagnosis not present

## 2016-08-09 DIAGNOSIS — C50911 Malignant neoplasm of unspecified site of right female breast: Secondary | ICD-10-CM | POA: Diagnosis not present

## 2016-08-09 DIAGNOSIS — I2510 Atherosclerotic heart disease of native coronary artery without angina pectoris: Secondary | ICD-10-CM | POA: Insufficient documentation

## 2016-08-09 DIAGNOSIS — Z888 Allergy status to other drugs, medicaments and biological substances status: Secondary | ICD-10-CM | POA: Diagnosis not present

## 2016-08-09 DIAGNOSIS — Z170 Estrogen receptor positive status [ER+]: Principal | ICD-10-CM

## 2016-08-09 DIAGNOSIS — Z87891 Personal history of nicotine dependence: Secondary | ICD-10-CM | POA: Diagnosis not present

## 2016-08-09 DIAGNOSIS — R928 Other abnormal and inconclusive findings on diagnostic imaging of breast: Secondary | ICD-10-CM | POA: Diagnosis not present

## 2016-08-09 HISTORY — PX: PARTIAL MASTECTOMY WITH AXILLARY SENTINEL LYMPH NODE BIOPSY: SHX6004

## 2016-08-09 LAB — SURGICAL PATHOLOGY

## 2016-08-09 SURGERY — PARTIAL MASTECTOMY WITH AXILLARY SENTINEL LYMPH NODE BIOPSY
Anesthesia: General | Laterality: Right | Wound class: Clean

## 2016-08-09 MED ORDER — ONDANSETRON HCL 4 MG/2ML IJ SOLN: 4.0000 mg | Freq: Once | INTRAMUSCULAR | Status: DC | PRN

## 2016-08-09 MED ORDER — TECHNETIUM TC 99M SULFUR COLLOID FILTERED
1.0000 | Freq: Once | INTRAVENOUS | Status: AC | PRN
  Administered 2016-08-09: 0.64 via INTRADERMAL

## 2016-08-09 MED ORDER — HYDROCODONE-ACETAMINOPHEN 5-325 MG PO TABS
1.0000 | ORAL_TABLET | ORAL | Status: DC | PRN
  Administered 2016-08-09: 1 via ORAL

## 2016-08-09 MED ORDER — FENTANYL CITRATE (PF) 100 MCG/2ML IJ SOLN
25.0000 ug | INTRAMUSCULAR | Status: DC | PRN
  Administered 2016-08-09 (×2): 50 ug via INTRAVENOUS

## 2016-08-09 MED ORDER — PHENYLEPHRINE HCL 10 MG/ML IJ SOLN
INTRAMUSCULAR | Status: DC | PRN
  Administered 2016-08-09 (×6): 50 ug via INTRAVENOUS

## 2016-08-09 MED ORDER — BUPIVACAINE-EPINEPHRINE (PF) 0.5% -1:200000 IJ SOLN
INTRAMUSCULAR | Status: DC | PRN
  Administered 2016-08-09: 30 mL

## 2016-08-09 MED ORDER — FENTANYL CITRATE (PF) 100 MCG/2ML IJ SOLN
INTRAMUSCULAR | Status: AC
  Filled 2016-08-09: qty 2

## 2016-08-09 MED ORDER — HYDROCODONE-ACETAMINOPHEN 5-325 MG PO TABS
ORAL_TABLET | ORAL | Status: AC
  Filled 2016-08-09: qty 1

## 2016-08-09 MED ORDER — PROMETHAZINE HCL 25 MG/ML IJ SOLN
INTRAMUSCULAR | Status: AC
  Filled 2016-08-09: qty 1

## 2016-08-09 MED ORDER — LACTATED RINGERS IV SOLN
INTRAVENOUS | Status: DC
  Administered 2016-08-09: 15:00:00 via INTRAVENOUS

## 2016-08-09 MED ORDER — METHYLENE BLUE 0.5 % INJ SOLN
INTRAVENOUS | Status: AC
  Filled 2016-08-09: qty 10

## 2016-08-09 MED ORDER — LIDOCAINE HCL (CARDIAC) 20 MG/ML IV SOLN
INTRAVENOUS | Status: DC | PRN
  Administered 2016-08-09: 80 mg via INTRAVENOUS

## 2016-08-09 MED ORDER — HYDROCODONE-ACETAMINOPHEN 5-325 MG PO TABS: 1.0000 | ORAL_TABLET | ORAL | 0 refills | Status: DC | PRN

## 2016-08-09 MED ORDER — LIDOCAINE HCL (PF) 2 % IJ SOLN
INTRAMUSCULAR | Status: AC
  Filled 2016-08-09: qty 2

## 2016-08-09 MED ORDER — PROPOFOL 10 MG/ML IV BOLUS
INTRAVENOUS | Status: AC
  Filled 2016-08-09: qty 20

## 2016-08-09 MED ORDER — KETOROLAC TROMETHAMINE 30 MG/ML IJ SOLN
INTRAMUSCULAR | Status: DC | PRN
  Administered 2016-08-09: 15 mg via INTRAVENOUS

## 2016-08-09 MED ORDER — BUPIVACAINE-EPINEPHRINE (PF) 0.5% -1:200000 IJ SOLN
INTRAMUSCULAR | Status: AC
  Filled 2016-08-09: qty 30

## 2016-08-09 MED ORDER — PROPOFOL 10 MG/ML IV BOLUS
INTRAVENOUS | Status: DC | PRN
  Administered 2016-08-09: 160 mg via INTRAVENOUS

## 2016-08-09 MED ORDER — FENTANYL CITRATE (PF) 100 MCG/2ML IJ SOLN
INTRAMUSCULAR | Status: DC | PRN
Start: 2016-08-09 — End: 2016-08-09
  Administered 2016-08-09: 25 ug via INTRAVENOUS
  Administered 2016-08-09: 50 ug via INTRAVENOUS

## 2016-08-09 MED ORDER — PROMETHAZINE HCL 25 MG/ML IJ SOLN
6.2500 mg | Freq: Once | INTRAMUSCULAR | Status: AC
  Administered 2016-08-09: 6.25 mg via INTRAVENOUS

## 2016-08-09 MED ORDER — METHYLENE BLUE 0.5 % INJ SOLN
INTRAVENOUS | Status: DC | PRN
  Administered 2016-08-09: 5 mL

## 2016-08-09 MED ORDER — ONDANSETRON HCL 4 MG/2ML IJ SOLN
INTRAMUSCULAR | Status: DC | PRN
  Administered 2016-08-09: 4 mg via INTRAVENOUS

## 2016-08-09 MED ORDER — ACETAMINOPHEN 10 MG/ML IV SOLN
INTRAVENOUS | Status: DC | PRN
  Administered 2016-08-09: 1000 mg via INTRAVENOUS

## 2016-08-09 MED ORDER — DEXAMETHASONE SODIUM PHOSPHATE 10 MG/ML IJ SOLN
INTRAMUSCULAR | Status: DC | PRN
  Administered 2016-08-09: 10 mg via INTRAVENOUS

## 2016-08-09 SURGICAL SUPPLY — 51 items
BANDAGE ELASTIC 6 LF NS (GAUZE/BANDAGES/DRESSINGS) IMPLANT
BLADE SURG 15 STRL SS SAFETY (BLADE) ×6 IMPLANT
BNDG GAUZE 4.5X4.1 6PLY STRL (MISCELLANEOUS) IMPLANT
BULB RESERV EVAC DRAIN JP 100C (MISCELLANEOUS) IMPLANT
CANISTER SUCT 1200ML W/VALVE (MISCELLANEOUS) ×3 IMPLANT
CHLORAPREP W/TINT 26ML (MISCELLANEOUS) ×3 IMPLANT
CLOSURE WOUND 1/2 X4 (GAUZE/BANDAGES/DRESSINGS) ×1
CNTNR SPEC 2.5X3XGRAD LEK (MISCELLANEOUS)
CONT SPEC 4OZ STER OR WHT (MISCELLANEOUS)
CONTAINER SPEC 2.5X3XGRAD LEK (MISCELLANEOUS) IMPLANT
COVER PROBE FLX POLY STRL (MISCELLANEOUS) ×3 IMPLANT
DEVICE DUBIN SPECIMEN MAMMOGRA (MISCELLANEOUS) ×3 IMPLANT
DRAIN CHANNEL JP 15F RND 16 (MISCELLANEOUS) IMPLANT
DRAPE LAPAROTOMY TRNSV 106X77 (MISCELLANEOUS) ×3 IMPLANT
DRSG TELFA 3X8 NADH (GAUZE/BANDAGES/DRESSINGS) ×3 IMPLANT
ELECT CAUTERY BLADE TIP 2.5 (TIP) ×3
ELECT REM PT RETURN 9FT ADLT (ELECTROSURGICAL) ×3
ELECTRODE CAUTERY BLDE TIP 2.5 (TIP) ×1 IMPLANT
ELECTRODE REM PT RTRN 9FT ADLT (ELECTROSURGICAL) ×1 IMPLANT
GAUZE FLUFF 18X24 1PLY STRL (GAUZE/BANDAGES/DRESSINGS) ×3 IMPLANT
GAUZE SPONGE 4X4 12PLY STRL (GAUZE/BANDAGES/DRESSINGS) IMPLANT
GLOVE BIO SURGEON STRL SZ7.5 (GLOVE) ×9 IMPLANT
GLOVE INDICATOR 8.0 STRL GRN (GLOVE) ×6 IMPLANT
GOWN STRL REUS W/ TWL LRG LVL3 (GOWN DISPOSABLE) ×2 IMPLANT
GOWN STRL REUS W/TWL LRG LVL3 (GOWN DISPOSABLE) ×4
KIT RM TURNOVER STRD PROC AR (KITS) ×3 IMPLANT
LABEL OR SOLS (LABEL) ×3 IMPLANT
MARGIN MAP 10MM (MISCELLANEOUS) ×3 IMPLANT
NDL SAFETY 22GX1.5 (NEEDLE) ×3 IMPLANT
NEEDLE HYPO 25X1 1.5 SAFETY (NEEDLE) ×3 IMPLANT
PACK BASIN MINOR ARMC (MISCELLANEOUS) ×3 IMPLANT
SHEARS FOC LG CVD HARMONIC 17C (MISCELLANEOUS) IMPLANT
SHEARS HARMONIC 9CM CVD (BLADE) IMPLANT
SLEVE PROBE SENORX GAMMA FIND (MISCELLANEOUS) ×3 IMPLANT
STRIP CLOSURE SKIN 1/2X4 (GAUZE/BANDAGES/DRESSINGS) ×2 IMPLANT
SUT ETHILON 3-0 FS-10 30 BLK (SUTURE) ×3
SUT SILK 2 0 (SUTURE) ×2
SUT SILK 2-0 18XBRD TIE 12 (SUTURE) ×1 IMPLANT
SUT VIC AB 2-0 CT1 27 (SUTURE) ×4
SUT VIC AB 2-0 CT1 TAPERPNT 27 (SUTURE) ×2 IMPLANT
SUT VIC AB 3-0 SH 27 (SUTURE) ×2
SUT VIC AB 3-0 SH 27X BRD (SUTURE) ×1 IMPLANT
SUT VIC AB 4-0 FS2 27 (SUTURE) ×6 IMPLANT
SUT VICRYL+ 3-0 144IN (SUTURE) ×3 IMPLANT
SUTURE EHLN 3-0 FS-10 30 BLK (SUTURE) ×1 IMPLANT
SWABSTK COMLB BENZOIN TINCTURE (MISCELLANEOUS) ×3 IMPLANT
SYR BULB IRRIG 60ML STRL (SYRINGE) ×3 IMPLANT
SYR CONTROL 10ML (SYRINGE) ×3 IMPLANT
SYRINGE 10CC LL (SYRINGE) ×3 IMPLANT
TAPE TRANSPORE STRL 2 31045 (GAUZE/BANDAGES/DRESSINGS) ×3 IMPLANT
WATER STERILE IRR 1000ML POUR (IV SOLUTION) ×3 IMPLANT

## 2016-08-09 NOTE — H&P (Signed)
[name] [name] [id number] January 25, 1941     HPI: Healthy 76 y/o with early stage breast cancer. She desires breast conservation. No change in clinical history since last office exam.   Prescriptions Prior to Admission  Medication Sig Dispense Refill Last Dose  . amLODipine (NORVASC) 5 MG tablet Take 1 tablet (5 mg total) by mouth daily. 30 tablet 2 08/09/2016 at Unknown time  . aspirin EC 81 MG tablet Take 81 mg by mouth daily.   08/05/2016  . Ferrous Sulfate (IRON) 28 MG TABS Take 28 mg by mouth daily.   08/06/2016  . fluocinonide cream (LIDEX) 2.35 % Apply 1 application topically daily as needed (rash).   Past Month at Unknown time  . lisinopril (PRINIVIL,ZESTRIL) 20 MG tablet Take 20 mg by mouth daily.   08/09/2016 at Unknown time  . omeprazole (PRILOSEC) 20 MG capsule Take 20 mg by mouth daily as needed (indigestion).   Past Week at Unknown time  . Clobetasol Prop Emollient Base (CLOBETASOL PROPIONATE E) 0.05 % emollient cream Apply 1 g topically daily. Per vagina/ urethra (Patient not taking: Reported on 08/05/2016) 30 g 11 Not Taking at Unknown time  . ferrous sulfate 325 (65 FE) MG tablet Take 1 tablet (325 mg total) by mouth daily. (Patient not taking: Reported on 08/03/2016) 30 tablet 12 Not Taking at Unknown time   Allergies  Allergen Reactions  . Celebrex  [Celecoxib]     Dark stools.  . Penicillins Other (See Comments)    Childhood reaction Has patient had a PCN reaction causing immediate rash, facial/tongue/throat swelling, SOB or lightheadedness with hypotension: Unknown Has patient had a PCN reaction causing severe rash involving mucus membranes or skin necrosis: Unknown Has patient had a PCN reaction that required hospitalization: Unknown Has patient had a PCN reaction occurring within the last 10 years: Unknown If all of the above answers are "NO", then may proceed with Cephalosporin use.   Marland Kitchen Prevacid  [Lansoprazole] Diarrhea   Past Medical History:  Diagnosis Date  . Cancer  ([place]) 07/23/2016   ER/PR pos/INVASIVE MAMMARY CARCINOMA WITH AREAS OF EXTRACELLULAR MUCIN  . Coronary artery disease   . GERD (gastroesophageal reflux disease)   . Hyperlipidemia   . Hypertension   . MI (myocardial infarction) ([place])    2015   Past Surgical History:  Procedure Laterality Date  . ABDOMINAL HYSTERECTOMY    . APPENDECTOMY    . BREAST BIOPSY Right 07/23/2016   INVASIVE MAMMARY CARCINOMA WITH AREAS OF EXTRACELLULAR MUCIN  . COLONOSCOPY  2016  . CORONARY ANGIOPLASTY WITH STENT PLACEMENT    . CYSTOSCOPY    . TONSILLECTOMY     Social History   Social History  . Marital status: Married    Spouse name: [name]  . Number of children: 2  . Years of education: N/A   Occupational History  . retired    Social History Main Topics  . Smoking status: Former Smoker    Packs/day: 0.25    Years: 10.00    Types: Cigarettes    Quit date: 03/06/1998  . Smokeless tobacco: Never Used  . Alcohol use No  . Drug use: No  . Sexual activity: Yes    Birth control/ protection: Surgical   Other Topics Concern  . Not on file   Social History Narrative  . No narrative on file   Social History   Social History Narrative  . No narrative on file     ROS: Negative.     PE: HEENT:  Negative. Lungs: Clear. Cardio: RR. Robert Bellow 08/09/2016   Assessment/Plan:  Proceed with right wide excision, SLN biopsy.

## 2016-08-09 NOTE — Anesthesia Post-op Follow-up Note (Cosign Needed)
Anesthesia QCDR form completed.        

## 2016-08-09 NOTE — Transfer of Care (Signed)
Immediate Anesthesia Transfer of Care Note  Patient: [name] [name]  Procedure(s) Performed: Procedure(s): PARTIAL MASTECTOMY WITH AXILLARY SENTINEL LYMPH NODE BIOPSY (Right)  Patient Location: PACU  Anesthesia Type:General  Level of Consciousness: drowsy and patient cooperative  Airway & Oxygen Therapy: Patient Spontanous Breathing and Patient connected to face mask oxygen  Post-op Assessment: Report given to RN, Post -op Vital signs reviewed and stable and Patient moving all extremities X 4  Post vital signs: Reviewed and stable  Last Vitals:  Vitals:   08/09/16 1148 08/09/16 1600  BP: (!) 142/71 130/62  Pulse: 69 68  Resp: 16 (!) 9  Temp: 36.8 C 36.3 C    Last Pain:  Vitals:   08/09/16 1148  TempSrc: Oral         Complications: No apparent anesthesia complications

## 2016-08-09 NOTE — OR Nursing (Signed)
Patient reports decreased pain to 2/10. She denies any further nausea. Taking po well.

## 2016-08-09 NOTE — Discharge Instructions (Signed)

## 2016-08-09 NOTE — Anesthesia Preprocedure Evaluation (Signed)
Anesthesia Evaluation  Patient identified by MRN, date of birth, ID band Patient awake    Reviewed: Allergy & Precautions, H&P , NPO status , Patient's Chart, lab work & pertinent test results, reviewed documented beta blocker date and time   History of Anesthesia Complications Negative for: history of anesthetic complications  Airway Mallampati: I  TM Distance: >3 FB Neck ROM: full    Dental  (+) Dental Advidsory Given, Missing, Chipped, Poor Dentition   Pulmonary neg pulmonary ROS, former smoker,           Cardiovascular Exercise Tolerance: Good hypertension, (-) angina+ CAD, + Past MI, + Cardiac Stents and + Peripheral Vascular Disease  (-) CABG (-) dysrhythmias (-) Valvular Problems/Murmurs     Neuro/Psych negative neurological ROS  negative psych ROS   GI/Hepatic Neg liver ROS, hiatal hernia, PUD, GERD  ,  Endo/Other  negative endocrine ROS  Renal/GU negative Renal ROS  negative genitourinary   Musculoskeletal   Abdominal   Peds  Hematology negative hematology ROS (+)   Anesthesia Other Findings Past Medical History: 07/23/2016: Cancer ([place])     Comment: 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 No date: Coronary artery disease No date: GERD (gastroesophageal reflux disease) No date: Hyperlipidemia No date: Hypertension No date: MI (myocardial infarction) ([place])     Comment: 2015   Reproductive/Obstetrics negative OB ROS                             Anesthesia Physical Anesthesia Plan  ASA: III  Anesthesia Plan: General LMA   Post-op Pain Management:    Induction: Intravenous  Airway Management Planned: LMA  Additional Equipment:   Intra-op Plan:   Post-operative Plan: Extubation in OR  Informed Consent: I have reviewed the patients History and Physical, chart, labs and discussed the procedure including the risks, benefits  and alternatives for the proposed anesthesia with the patient or authorized representative who has indicated his/her understanding and acceptance.   Dental Advisory Given  Plan Discussed with: Anesthesiologist, CRNA and Surgeon  Anesthesia Plan Comments:         Anesthesia Quick Evaluation

## 2016-08-09 NOTE — Anesthesia Procedure Notes (Signed)
Procedure Name: LMA Insertion Performed by: Shyia Fillingim Pre-anesthesia Checklist: Patient identified, Patient being monitored, Timeout performed, Emergency Drugs available and Suction available Patient Re-evaluated:Patient Re-evaluated prior to inductionOxygen Delivery Method: Circle system utilized Preoxygenation: Pre-oxygenation with 100% oxygen Intubation Type: IV induction Ventilation: Mask ventilation without difficulty LMA: LMA inserted LMA Size: 4.0 Tube type: Oral Number of attempts: 1 Placement Confirmation: positive ETCO2 and breath sounds checked- equal and bilateral Tube secured with: Tape Dental Injury: Teeth and Oropharynx as per pre-operative assessment        

## 2016-08-09 NOTE — OR Nursing (Signed)
Dr. Bary Castilla into see patient and she is complaining of nausea. He has instructed me to give phenergan IV. Given as ordered.

## 2016-08-09 NOTE — Op Note (Signed)
Preoperative diagnosis: Right breast cancer, desirous of breast conservation.  Postoperative diagnosis: Same.  Operative procedure: Ultrasound-guided right breast wide excision and sentinel node biopsy.  Operating surgeon: Ollen Bowl, M.D.  Anesthesia: Gen. by LMA, Marcaine 0.5% with 1-200,000 units of epinephrine, 30 mL.  Estimated blood loss: 5 mL.  Clinical note: This 76 year old was recently diagnosed with a right breast cancer desired breast conservation. She was made for wide excision. She underwent injection with technetium sulfur colloid prior to the procedure.  Operative note: After the induction of general anesthesia the areolar tissue was cleansed with alcohol and 5 mL of 0.5% methylene blue was injected for sentinel node identification. The breast chest and axilla was then prepped with ChloraPrep and draped. The area of the tumor which was nonpalpable in the upper inner quadrant of the left breast was identified by ultrasound. A 3 x 3 x 4 cm block of tissue was excised down to and including the pectoralis fascia. The specimen was orientated and specimen radiograph confirmed the clip in the middle of the sample. Telephone report from pathology showed the closest margin was anterior at 4 mm. While the pathology and radiographs were pending on the breast attention was turned to the axilla. The node seeker was used to identify the area of increased uptake and local anesthetic was infiltrated for postoperative analgesia. A transverse skin line incision was made and carried down through into the deep fascia. 1 hot, blue lymph node and one hot lymph node was identified. Counts in the axilla were below 50 after these 2 were removed. These were sent in formalin for routine histology.  The axilla was closed with 2-0 Vicryl figure-of-eight sutures to the axillary envelope into the subcutaneous fat. The skin was closed with a running 4-0 Vicryl septic suture.  The wide excision site was  prepared for closure by mobilizing the breast circumferentially for 5 cm off the underlying pectoralis muscle. The fascia was then approximated with interrupted 2-0 Vicryl figure-of-eight sutures. The adipose layer was approximated in similar fashion. The skin was closed with a running 4-0 Vicryl septic suture. Benzoin and Steri-Strips were applied. Telfa pad, fluffed gauze and surgical bra was applied.  The patient tolerated the procedure well was taken to the recovery room in stable condition.

## 2016-08-10 ENCOUNTER — Encounter: Payer: Self-pay | Admitting: General Surgery

## 2016-08-10 ENCOUNTER — Other Ambulatory Visit: Payer: Self-pay | Admitting: *Deleted

## 2016-08-10 NOTE — Progress Notes (Signed)
  Oncology Nurse Navigator Documentation  Navigator Location: [facility] (08/10/16 1300)   )Navigator Encounter Type: Telephone (post-op) (08/10/16 1300) Telephone: Lahoma Crocker Call;Appt Confirmation/Clarification (08/10/16 1300)     Surgery Date: 08/09/16 (08/10/16 1300)                 Barriers/Navigation Needs: Coordination of Care (08/10/16 1300)   Interventions: Coordination of Care (08/10/16 1300)                      Time Spent with Patient: 15 (08/10/16 1300)  Patient doing well post-op.  Confirmed follow-up appointments.  Husband scheduled for surgery at [facility] 08/23/16.

## 2016-08-11 LAB — SURGICAL PATHOLOGY

## 2016-08-12 ENCOUNTER — Ambulatory Visit (INDEPENDENT_AMBULATORY_CARE_PROVIDER_SITE_OTHER): Payer: PPO | Admitting: General Surgery

## 2016-08-12 ENCOUNTER — Inpatient Hospital Stay (INDEPENDENT_AMBULATORY_CARE_PROVIDER_SITE_OTHER): Payer: PPO

## 2016-08-12 ENCOUNTER — Encounter: Payer: Self-pay | Admitting: General Surgery

## 2016-08-12 VITALS — BP 130/68 | HR 80 | Resp 12 | Ht 65.0 in | Wt 155.0 lb

## 2016-08-12 DIAGNOSIS — C50211 Malignant neoplasm of upper-inner quadrant of right female breast: Secondary | ICD-10-CM

## 2016-08-12 DIAGNOSIS — Z170 Estrogen receptor positive status [ER+]: Secondary | ICD-10-CM | POA: Diagnosis not present

## 2016-08-12 NOTE — Anesthesia Postprocedure Evaluation (Signed)
Anesthesia Post Note  Patient: [name] [name]  Procedure(s) Performed: Procedure(s) (LRB): PARTIAL MASTECTOMY WITH AXILLARY SENTINEL LYMPH NODE BIOPSY (Right)  Patient location during evaluation: PACU Anesthesia Type: General Level of consciousness: awake and alert Pain management: pain level controlled Vital Signs Assessment: post-procedure vital signs reviewed and stable Respiratory status: spontaneous breathing, nonlabored ventilation, respiratory function stable and patient connected to nasal cannula oxygen Cardiovascular status: blood pressure returned to baseline and stable Postop Assessment: no signs of nausea or vomiting Anesthetic complications: no     Last Vitals:  Vitals:   08/09/16 1703 08/09/16 1758  BP: (!) 147/63 138/69  Pulse: 72 69  Resp: 14 16  Temp: (!) 35.7 C     Last Pain:  Vitals:   08/10/16 0830  TempSrc:   PainSc: 0-No pain                 Molli Barrows

## 2016-08-12 NOTE — Progress Notes (Signed)
Patient ID: [name] [name], female   DOB: 10-Nov-1940, 76 y.o.   MRN: [id number]  Chief Complaint  Patient presents with  . Routine Post Op    HPI [name] [name] is a 76 y.o. female.  Here today for postoperative visit, partial mastectomy on 08-09-16, she states she is doing well. Denies any gastrointestinal issues, bowels are moving regular.  HPI  Past Medical History:  Diagnosis Date  . 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 Coronary artery disease   . GERD (gastroesophageal reflux disease)   . Hyperlipidemia   . Hypertension   . MI (myocardial infarction) ([place])    2015    Past Surgical History:  Procedure Laterality Date  . ABDOMINAL HYSTERECTOMY    . APPENDECTOMY    . BREAST BIOPSY Right 07/23/2016   INVASIVE MAMMARY CARCINOMA WITH AREAS OF EXTRACELLULAR MUCIN  . COLONOSCOPY  2016  . CORONARY ANGIOPLASTY WITH STENT PLACEMENT    . CYSTOSCOPY    . PARTIAL MASTECTOMY WITH AXILLARY SENTINEL LYMPH NODE BIOPSY Right 08/09/2016   Procedure: PARTIAL MASTECTOMY WITH AXILLARY SENTINEL LYMPH NODE BIOPSY;  Surgeon: Robert Bellow, MD;  Location: [facility] ORS;  Service: General;  Laterality: Right;  . TONSILLECTOMY      Family History  Problem Relation Age of Onset  . Hyperlipidemia Mother   . Allergies Mother   . Cerebral aneurysm Mother        cause of death at age 55  . Heart disease Father        Fatal MI ag 32  . Cancer Brother        lung cancer  . Hyperlipidemia Brother   . Colonic polyp Brother   . Healthy Son   . Cancer Brother        colon  . Healthy Son     Social History Social History  Substance Use Topics  . Smoking status: Former Smoker    Packs/day: 0.25    Years: 10.00    Types: Cigarettes    Quit date: 03/06/1998  . Smokeless tobacco: Never Used  . Alcohol use No    Allergies  Allergen Reactions  . Celebrex  [Celecoxib]     Dark stools.  . Penicillins Other (See Comments)     Childhood reaction Has patient had a PCN reaction causing immediate rash, facial/tongue/throat swelling, SOB or lightheadedness with hypotension: Unknown Has patient had a PCN reaction causing severe rash involving mucus membranes or skin necrosis: Unknown Has patient had a PCN reaction that required hospitalization: Unknown Has patient had a PCN reaction occurring within the last 10 years: Unknown If all of the above answers are "NO", then may proceed with Cephalosporin use.   Marland Kitchen Prevacid  [Lansoprazole] Diarrhea    Current Outpatient Prescriptions  Medication Sig Dispense Refill  . amLODipine (NORVASC) 5 MG tablet Take 1 tablet (5 mg total) by mouth daily. 30 tablet 2  . aspirin EC 81 MG tablet Take 81 mg by mouth daily.    . Ferrous Sulfate (IRON) 28 MG TABS Take 28 mg by mouth daily.    . fluocinonide cream (LIDEX) 0.73 % Apply 1 application topically daily as needed (rash).    Marland Kitchen HYDROcodone-acetaminophen (NORCO) 5-325 MG tablet Take 1-2 tablets by mouth every 4 (four) hours as needed for moderate pain. 30 tablet 0  . lisinopril (PRINIVIL,ZESTRIL) 20 MG tablet Take 20 mg by mouth daily.    Marland Kitchen omeprazole (PRILOSEC) 20 MG  capsule Take 20 mg by mouth daily as needed (indigestion).     No current facility-administered medications for this visit.     Review of Systems Review of Systems  Constitutional: Negative.   Respiratory: Negative.   Cardiovascular: Negative.     Blood pressure 130/68, pulse 80, resp. rate 12, height 5\' 5"  (1.651 m), weight 155 lb (70.3 kg).  Physical Exam Physical Exam  Constitutional: She is oriented to person, place, and time. She appears well-developed and well-nourished.  Pulmonary/Chest:    incision healing well right breast, steri stripes intact  Lymphadenopathy:    She has no axillary adenopathy.  Neurological: She is alert and oriented to person, place, and time.  Skin: Skin is warm and dry.  Psychiatric: Her behavior is normal.    Data  Reviewed A. BREAST, RIGHT 1:00; WIDE EXCISION:  - INVASIVE MUCINOUS MAMMARY CARCINOMA.  - DUCTAL CARCINOMA IN SITU.  - SEE CANCER SUMMARY BELOW.  - PRIOR BIOPSY SITE.   B. SENTINEL LYMPH NODE, 1 AND 2; EXCISION:  - TWO LYMPH NODES, NEGATIVE FOR MALIGNANCY (0/2).   T1b, N0; ER 90%; PR 51-90%, her 2 neu not overexpressed.  Assessment    Doing well status post breast wide excision and sentinel node biopsy.    Plan    The patient is not a candidate for accelerated partial breast radiation due to anatomic spacing.  We'll arrange for assessment by radiation oncology in the near future.    Wear your bra for comfort as needed. Follow up 2 weeks  HPI, Physical Exam, Assessment and Plan have been scribed under the direction and in the presence of Robert Bellow, MD.  Karie Fetch, RN  I have completed the exam and reviewed the above documentation for accuracy and completeness.  I agree with the above.  Haematologist has been used and any errors in dictation or transcription are unintentional.  Hervey Ard, M.D., F.A.C.S.  Robert Bellow 08/13/2016, 9:07 PM

## 2016-08-12 NOTE — Patient Instructions (Addendum)
The patient is aware to call back for any questions or concerns. Wear your bra for comfort as needed

## 2016-08-13 ENCOUNTER — Encounter: Payer: Self-pay | Admitting: General Surgery

## 2016-08-20 ENCOUNTER — Encounter (INDEPENDENT_AMBULATORY_CARE_PROVIDER_SITE_OTHER): Payer: Self-pay

## 2016-08-20 ENCOUNTER — Ambulatory Visit (INDEPENDENT_AMBULATORY_CARE_PROVIDER_SITE_OTHER): Payer: Self-pay | Admitting: Vascular Surgery

## 2016-08-20 HISTORY — PX: BREAST LUMPECTOMY: SHX2

## 2016-08-25 ENCOUNTER — Ambulatory Visit: Payer: PPO | Admitting: Oncology

## 2016-08-25 ENCOUNTER — Telehealth: Payer: Self-pay | Admitting: Family Medicine

## 2016-08-25 NOTE — Telephone Encounter (Signed)
1year rf. 

## 2016-08-25 NOTE — Telephone Encounter (Signed)
[facility] faxed a refill request on the following medications:  amLODipine (NORVASC) 5 MG tablet.  Take 1 tablet by mouth every day.  90 day supply.  [facility] [address]

## 2016-08-26 ENCOUNTER — Ambulatory Visit (INDEPENDENT_AMBULATORY_CARE_PROVIDER_SITE_OTHER): Payer: PPO | Admitting: General Surgery

## 2016-08-26 ENCOUNTER — Encounter: Payer: Self-pay | Admitting: General Surgery

## 2016-08-26 VITALS — BP 128/70 | HR 76 | Resp 12 | Ht 65.0 in | Wt 153.0 lb

## 2016-08-26 DIAGNOSIS — Z170 Estrogen receptor positive status [ER+]: Secondary | ICD-10-CM

## 2016-08-26 DIAGNOSIS — C50211 Malignant neoplasm of upper-inner quadrant of right female breast: Secondary | ICD-10-CM

## 2016-08-26 NOTE — Patient Instructions (Addendum)
Patient to return in three months. Patient to see DR. Chrystal on 08/30/2016.The patient is aware to call back for any questions or concerns.

## 2016-08-26 NOTE — Progress Notes (Signed)
Patient ID: [name] [name], female   DOB: 04-16-1940, 76 y.o.   MRN: [id number]  Chief Complaint  Patient presents with  . Routine Post Op    HPI [name] [name] is a 76 y.o. female here today for her post op right breast lumpectomy done on 08/09/2016. Patient states she is doing well. Patient see Dr. Baruch Gouty on 08/30/2016.  Her husband had surgery on 08/23/2016 for a GIST tumor and is doing well.  Marland Kitchen  HPI  Past Medical History:  Diagnosis Date  . 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 Mucin noted on biopsy, not on wide excision.  . Coronary artery disease   . GERD (gastroesophageal reflux disease)   . Hyperlipidemia   . Hypertension   . MI (myocardial infarction) ([place])    2015    Past Surgical History:  Procedure Laterality Date  . ABDOMINAL HYSTERECTOMY    . APPENDECTOMY    . BREAST BIOPSY Right 07/23/2016   INVASIVE MAMMARY CARCINOMA WITH AREAS OF EXTRACELLULAR MUCIN  . COLONOSCOPY  2016  . CORONARY ANGIOPLASTY WITH STENT PLACEMENT    . CYSTOSCOPY    . PARTIAL MASTECTOMY WITH AXILLARY SENTINEL LYMPH NODE BIOPSY Right 08/09/2016   Procedure: PARTIAL MASTECTOMY WITH AXILLARY SENTINEL LYMPH NODE BIOPSY;  Surgeon: Robert Bellow, MD;  Location: [facility] ORS;  Service: General;  Laterality: Right;  . TONSILLECTOMY      Family History  Problem Relation Age of Onset  . Hyperlipidemia Mother   . Allergies Mother   . Cerebral aneurysm Mother        cause of death at age 85  . Heart disease Father        Fatal MI ag 72  . Cancer Brother        lung cancer  . Hyperlipidemia Brother   . Colonic polyp Brother   . Healthy Son   . Cancer Brother        colon  . Healthy Son     Social History Social History  Substance Use Topics  . Smoking status: Former Smoker    Packs/day: 0.25    Years: 10.00    Types: Cigarettes    Quit date: 03/06/1998  . Smokeless tobacco: Never Used  . Alcohol use No    Allergies   Allergen Reactions  . Celebrex  [Celecoxib]     Dark stools.  . Penicillins Other (See Comments)    Childhood reaction Has patient had a PCN reaction causing immediate rash, facial/tongue/throat swelling, SOB or lightheadedness with hypotension: Unknown Has patient had a PCN reaction causing severe rash involving mucus membranes or skin necrosis: Unknown Has patient had a PCN reaction that required hospitalization: Unknown Has patient had a PCN reaction occurring within the last 10 years: Unknown If all of the above answers are "NO", then may proceed with Cephalosporin use.   Marland Kitchen Prevacid  [Lansoprazole] Diarrhea    Current Outpatient Prescriptions  Medication Sig Dispense Refill  . amLODipine (NORVASC) 5 MG tablet Take 1 tablet (5 mg total) by mouth daily. 30 tablet 2  . aspirin EC 81 MG tablet Take 81 mg by mouth daily.    . Ferrous Sulfate (IRON) 28 MG TABS Take 28 mg by mouth daily.    . fluocinonide cream (LIDEX) 4.09 % Apply 1 application topically daily as needed (rash).    Marland Kitchen HYDROcodone-acetaminophen (NORCO) 5-325 MG tablet Take 1-2 tablets by mouth every 4 (four) hours as needed for  moderate pain. 30 tablet 0  . lisinopril (PRINIVIL,ZESTRIL) 20 MG tablet Take 20 mg by mouth daily.    Marland Kitchen omeprazole (PRILOSEC) 20 MG capsule Take 20 mg by mouth daily as needed (indigestion).     No current facility-administered medications for this visit.     Review of Systems Review of Systems  Constitutional: Negative.   Respiratory: Negative.   Cardiovascular: Negative.     Blood pressure 128/70, pulse 76, resp. rate 12, height 5\' 5"  (1.651 m), weight 153 lb (69.4 kg).  Physical Exam Physical Exam  Constitutional: She is oriented to person, place, and time. She appears well-developed and well-nourished.  Pulmonary/Chest:    Right lumpectomy site is clean and healing well.   Neurological: She is alert and oriented to person, place, and time.  Skin: Skin is warm and dry.    Data  Reviewed T1b, N0; ER 90%; PR 51-90%, her 2 neu not overexpressed.  Assessment    Doing well status post right breast wide excision and sentinel node biopsy.    Plan    Arrangements are in place for the patient to be evaluated by radiation oncology. She'll be a candidate for antiestrogen therapy after radiation is complete.    Patient to return in three months.  Patient to see DR. Chrystal on 08/30/2016. HPI, Physical Exam, Assessment and Plan have been scribed under the direction and in the presence of Hervey Ard, MD.  Gaspar Cola, CMA  I have completed the exam and reviewed the above documentation for accuracy and completeness.  I agree with the above.  Haematologist has been used and any errors in dictation or transcription are unintentional.  Hervey Ard, M.D., F.A.C.S.   Robert Bellow 08/27/2016, 6:44 AM

## 2016-08-29 NOTE — Progress Notes (Signed)
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  INTERVAL HISTORY: Patient returns to clinic today for further evaluation, discussion of her final pathology results, and treatment planning. She continues to have mild breast tenderness at her surgical site, but otherwise feels well. She has no neurologic complaints. She denies any recent fevers or illnesses. She has a good appetite and denies weight loss. She has no chest pain or shortness of breath. She denies any nausea, vomiting, constipation, or diarrhea. She has no urinary complaints. Patient otherwise feels well and offers no further specific complaints.  REVIEW OF SYSTEMS:   Review of Systems  Constitutional: Negative.  Negative for fever, malaise/fatigue and weight loss.  Respiratory: Negative.  Negative for cough and shortness of breath.   Cardiovascular: Negative.  Negative for chest pain and leg swelling.  Genitourinary: Negative.   Musculoskeletal: Negative.   Skin: Negative.  Negative for rash.  Neurological: Negative.  Negative for sensory change and weakness.  Psychiatric/Behavioral: Negative.  The patient is not nervous/anxious.     As per HPI. Otherwise, a complete review of systems is negative.  PAST MEDICAL HISTORY: Past Medical History:  Diagnosis Date  . Cancer ([place]) 07/23/2016   T1b, N0; ER/PR+, 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  Mucin noted on biopsy, not on wide excision.  . Coronary artery disease   . GERD (gastroesophageal reflux disease)   . Hyperlipidemia   . Hypertension   . MI (myocardial infarction) ([place])    2015    PAST SURGICAL HISTORY: Past Surgical History:  Procedure Laterality Date  . ABDOMINAL HYSTERECTOMY    . APPENDECTOMY    . BREAST BIOPSY Right 07/23/2016   INVASIVE MAMMARY CARCINOMA WITH AREAS OF EXTRACELLULAR MUCIN  . COLONOSCOPY  2016  . CORONARY ANGIOPLASTY WITH STENT PLACEMENT    . CYSTOSCOPY    . PARTIAL MASTECTOMY WITH AXILLARY SENTINEL LYMPH NODE BIOPSY Right 08/09/2016   Procedure: PARTIAL MASTECTOMY WITH AXILLARY SENTINEL LYMPH NODE BIOPSY;  Surgeon: Robert Bellow, MD;  Location: [facility] ORS;  Service: General;  Laterality: Right;  . TONSILLECTOMY      FAMILY HISTORY: Family History  Problem Relation Age of Onset  . Hyperlipidemia Mother   . Allergies Mother   . Cerebral aneurysm Mother        cause of death at age 58  . Heart disease Father        Fatal MI ag 83  . Cancer Brother        lung cancer  . Hyperlipidemia Brother   . Colonic polyp Brother   . Healthy Son   . Cancer Brother        colon  . Healthy Son     ADVANCED DIRECTIVES (Y/N):  N  HEALTH MAINTENANCE: Social History  Substance Use Topics  . Smoking status: Former Smoker    Packs/day: 0.25    Years: 10.00    Types: Cigarettes    Quit date: 03/06/1998  . Smokeless tobacco: Never Used  . Alcohol use  No     Colonoscopy:  PAP:  Bone density:  Lipid panel:  Allergies  Allergen Reactions  . Celebrex  [Celecoxib]     Dark stools.  . Penicillins Other (See Comments)    Childhood reaction Has patient had a PCN reaction causing immediate rash, facial/tongue/throat swelling, SOB or lightheadedness with hypotension: Unknown Has patient had a PCN reaction causing severe rash involving mucus membranes or skin necrosis: Unknown Has patient had a PCN reaction that required hospitalization:  Unknown Has patient had a PCN reaction occurring within the last 10 years: Unknown If all of the above answers are "NO", then may proceed with Cephalosporin use.   Marland Kitchen Prevacid  [Lansoprazole] Diarrhea    Current Outpatient Prescriptions  Medication Sig Dispense Refill  . amLODipine (NORVASC) 5 MG tablet Take 1 tablet (5 mg total) by mouth daily. 30 tablet 2  . aspirin EC 81 MG tablet Take 81 mg by mouth daily.    . Ferrous Sulfate (IRON) 28 MG TABS Take 28 mg by mouth daily.    . fluocinonide cream (LIDEX) 4.80 % Apply 1 application topically daily as needed (rash).    Marland Kitchen HYDROcodone-acetaminophen (NORCO) 5-325 MG tablet Take 1-2 tablets by mouth every 4 (four) hours as needed for moderate pain. 30 tablet 0  . lisinopril (PRINIVIL,ZESTRIL) 20 MG tablet Take 20 mg by mouth daily.    Marland Kitchen omeprazole (PRILOSEC) 20 MG capsule Take 20 mg by mouth daily as needed (indigestion).     No current facility-administered medications for this visit.     OBJECTIVE: There were no vitals filed for this visit.   There is no height or weight on file to calculate BMI.    ECOG FS:0 - Asymptomatic  General: Well-developed, well-nourished, no acute distress. Eyes: Pink conjunctiva, anicteric sclera. Breasts: Well healing surgical scar on right breast. Lungs: Clear to auscultation bilaterally. Heart: Regular rate and rhythm. No rubs, murmurs, or gallops. Abdomen: Soft, nontender, nondistended. No organomegaly noted, normoactive bowel sounds. Musculoskeletal: No edema, cyanosis, or clubbing. Neuro: Alert, answering all questions appropriately. Cranial nerves grossly intact. Skin: No rashes or petechiae noted. Psych: Normal affect.   LAB RESULTS:  Lab Results  Component Value Date   NA 145 (H) 07/29/2016   K 4.4 07/29/2016   CL 105 07/29/2016   CO2 25 07/29/2016   GLUCOSE 84 07/29/2016   BUN 18 07/29/2016   CREATININE 0.88 07/29/2016   CALCIUM 9.3 07/29/2016   PROT 6.4 07/29/2016   ALBUMIN 3.9  07/29/2016   AST 13 07/29/2016   ALT 9 07/29/2016   ALKPHOS 68 07/29/2016   BILITOT 0.7 07/29/2016   GFRNONAA 64 07/29/2016   GFRAA 73 07/29/2016    Lab Results  Component Value Date   WBC 7.0 07/29/2016   NEUTROABS 4.1 07/29/2016   HGB 13.5 07/29/2016   HCT 40.7 07/29/2016   MCV 92 07/29/2016   PLT 240 07/29/2016     STUDIES: Nm Sentinel Node Injection  Result Date: 08/09/2016 CLINICAL DATA:  Right breast cancer. EXAM: NUCLEAR MEDICINE BREAST LYMPHOSCINTIGRAPHY TECHNIQUE: Intradermal injection of radiopharmaceutical was performed at the 12 o'clock, 3 o'clock, 6 o'clock, and 9 o'clock positions around the right nipple. The patient was then sent to the operating room where the sentinel node(s) were identified and removed by the surgeon. RADIOPHARMACEUTICALS:  Total of 1 mCi Millipore-filtered Technetium-53msulfur colloid, injected in four aliquots of 0.25 mCi each. IMPRESSION: Uncomplicated intradermal injection of a total of 1 mCi Technetium-931mulfur colloid for purposes of sentinel  node identification. Electronically Signed   By: Aletta Edouard M.D.   On: 08/09/2016 12:53   Mm Breast Surgical Specimen  Result Date: 08/20/2016 CLINICAL DATA:  Surgical specimen from right breast lumpectomy. EXAM: SPECIMEN RADIOGRAPH OF THE RIGHT BREAST COMPARISON:  Previous exam(s). FINDINGS: Status post excision of the right breast. The coil shaped biopsy marker clip is present centrally in the specimen at F 9. IMPRESSION: Specimen radiograph of the right breast. Electronically Signed   By: Fidela Salisbury M.D.   On: 08/20/2016 14:02   US Breast Complete Uni Right Inc Axilla  Result Date: 08/20/2016 Please refer to "Notes" to see consult details.   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  PLAN:    1. 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 Given the size of the tumor, no Oncotype DX was ordered and no adjuvant chemotherapy is recommended. She had consultation with radiation oncology earlier this morning and plans to proceed with adjuvant XRT. She will benefit from an aromatase inhibitor for 5 years at the conclusion of her XRT. Return to clinic in mid August for further evaluation and initiation of aromatase inhibitor.   Approximately 30 minutes was spent in discussion of which greater than 50% was consultation.  Patient expressed understanding and was in agreement with this plan. She also understands that She can call clinic at any time with any questions, concerns, or complaints.   Cancer Staging Malignant neoplasm of upper-inner quadrant of right breast in female, estrogen receptor positive ([place]) Staging form: Breast, AJCC 8th Edition - Pathologic stage from 08/30/2016: Stage IA (pT1b, pN0, cM0, G2, ER: Positive, PR: Positive, HER2: Negative) - Signed by Lloyd Huger, MD on 08/30/2016   Lloyd Huger, MD   08/30/2016 11:14 AM

## 2016-08-30 ENCOUNTER — Ambulatory Visit
Admission: RE | Admit: 2016-08-30 | Discharge: 2016-08-30 | Disposition: A | Discharge status: Home / Self Care | Payer: PPO | Source: Ambulatory Visit | Attending: Radiation Oncology | Admitting: Radiation Oncology

## 2016-08-30 ENCOUNTER — Inpatient Hospital Stay: Payer: PPO | Attending: Oncology | Admitting: Oncology

## 2016-08-30 ENCOUNTER — Encounter: Payer: Self-pay | Admitting: Radiation Oncology

## 2016-08-30 VITALS — BP 172/83 | HR 75 | Temp 98.3°F | Resp 18 | Wt 153.9 lb

## 2016-08-30 DIAGNOSIS — Z800 Family history of malignant neoplasm of digestive organs: Secondary | ICD-10-CM

## 2016-08-30 DIAGNOSIS — I2510 Atherosclerotic heart disease of native coronary artery without angina pectoris: Secondary | ICD-10-CM | POA: Insufficient documentation

## 2016-08-30 DIAGNOSIS — Z87891 Personal history of nicotine dependence: Secondary | ICD-10-CM | POA: Insufficient documentation

## 2016-08-30 DIAGNOSIS — G8918 Other acute postprocedural pain: Secondary | ICD-10-CM | POA: Diagnosis not present

## 2016-08-30 DIAGNOSIS — Z79899 Other long term (current) drug therapy: Secondary | ICD-10-CM | POA: Insufficient documentation

## 2016-08-30 DIAGNOSIS — Z7982 Long term (current) use of aspirin: Secondary | ICD-10-CM | POA: Insufficient documentation

## 2016-08-30 DIAGNOSIS — C50211 Malignant neoplasm of upper-inner quadrant of right female breast: Secondary | ICD-10-CM | POA: Insufficient documentation

## 2016-08-30 DIAGNOSIS — I10 Essential (primary) hypertension: Secondary | ICD-10-CM | POA: Insufficient documentation

## 2016-08-30 DIAGNOSIS — Z880 Allergy status to penicillin: Secondary | ICD-10-CM | POA: Insufficient documentation

## 2016-08-30 DIAGNOSIS — K219 Gastro-esophageal reflux disease without esophagitis: Secondary | ICD-10-CM | POA: Diagnosis not present

## 2016-08-30 DIAGNOSIS — Z510 Encounter for antineoplastic radiation therapy: Secondary | ICD-10-CM | POA: Insufficient documentation

## 2016-08-30 DIAGNOSIS — Z801 Family history of malignant neoplasm of trachea, bronchus and lung: Secondary | ICD-10-CM | POA: Insufficient documentation

## 2016-08-30 DIAGNOSIS — E785 Hyperlipidemia, unspecified: Secondary | ICD-10-CM | POA: Diagnosis not present

## 2016-08-30 DIAGNOSIS — Z86010 Personal history of colonic polyps: Secondary | ICD-10-CM | POA: Insufficient documentation

## 2016-08-30 DIAGNOSIS — Z9012 Acquired absence of left breast and nipple: Secondary | ICD-10-CM | POA: Diagnosis not present

## 2016-08-30 DIAGNOSIS — Z170 Estrogen receptor positive status [ER+]: Secondary | ICD-10-CM | POA: Insufficient documentation

## 2016-08-30 DIAGNOSIS — I252 Old myocardial infarction: Secondary | ICD-10-CM | POA: Diagnosis not present

## 2016-08-30 NOTE — Progress Notes (Signed)
Patient denies any concerns today. Patient seen in radiation oncology today as well.

## 2016-08-30 NOTE — Consult Note (Signed)
NEW PATIENT EVALUATION  Name: [name] [name]  MRN: [id number]  Date:   08/30/2016     DOB: 01/30/1941   This 76 y.o. female patient presents to the clinic for initial evaluation of stage I ER/PR positive HER-2/neu negative invasive mammary carcinoma of the right breast status post wide local excision of upper-inner quadrant.Marland Kitchen  REFERRING PHYSICIAN: Jerrol Banana.,*  CHIEF COMPLAINT:  Chief Complaint  Patient presents with  . Breast Cancer    DIAGNOSIS: The encounter diagnosis was Malignant neoplasm of upper-inner quadrant of right breast in female, estrogen receptor positive ([place]).   PREVIOUS INVESTIGATIONS:  Pathology reports reviewed Mammograms ultrasound reviewed Clinical notes reviewed  HPI: Patient is a 76 year old female who presented with an abnormal mammogram of her right breast. Mammogram demonstrated a 7 x 5 mm lesion in the upper inner quadrant confirmed on ultrasound suspicious for malignancy. She underwent an ultrasound-guided biopsy which was positive for invasive mammary carcinoma ER/PR positive HER-2/neu 2+. She went on to have a wide local excision for an 8 mm overall grade 2 invasive mammary carcinoma with mucinous features. Margins were clear but close at 4 mm from superior margin. 2 sentinel lymph nodes were negative. HER-2/neu by in situ hybridization was negative. Patient has done well postoperatively has has been seen by medical oncology is now referred to radiation oncology for consideration of treatment. She specifically denies breast tenderness cough or bone pain.  PLANNED TREATMENT REGIMEN: Hypofractionated course of right whole breast radiation  PAST MEDICAL HISTORY:  has a past medical history of Cancer ([place]) (07/23/2016); Coronary artery disease; GERD (gastroesophageal reflux disease); Hyperlipidemia; Hypertension; and MI (myocardial infarction) ([place]).    PAST SURGICAL HISTORY:  Past Surgical History:  Procedure Laterality Date  . ABDOMINAL  HYSTERECTOMY    . APPENDECTOMY    . BREAST BIOPSY Right 07/23/2016   INVASIVE MAMMARY CARCINOMA WITH AREAS OF EXTRACELLULAR MUCIN  . COLONOSCOPY  2016  . CORONARY ANGIOPLASTY WITH STENT PLACEMENT    . CYSTOSCOPY    . PARTIAL MASTECTOMY WITH AXILLARY SENTINEL LYMPH NODE BIOPSY Right 08/09/2016   Procedure: PARTIAL MASTECTOMY WITH AXILLARY SENTINEL LYMPH NODE BIOPSY;  Surgeon: Robert Bellow, MD;  Location: [facility] ORS;  Service: General;  Laterality: Right;  . TONSILLECTOMY      FAMILY HISTORY: family history includes Allergies in her mother; Cancer in her brother and brother; Cerebral aneurysm in her mother; Colonic polyp in her brother; Healthy in her son and son; Heart disease in her father; Hyperlipidemia in her brother and mother.  SOCIAL HISTORY:  reports that she quit smoking about 18 years ago. Her smoking use included Cigarettes. She has a 2.50 pack-year smoking history. She has never used smokeless tobacco. She reports that she does not drink alcohol or use drugs.  ALLERGIES: Celebrex  [celecoxib]; Penicillins; and Prevacid  [lansoprazole]  MEDICATIONS:  Current Outpatient Prescriptions  Medication Sig Dispense Refill  . amLODipine (NORVASC) 5 MG tablet Take 1 tablet (5 mg total) by mouth daily. 30 tablet 2  . aspirin EC 81 MG tablet Take 81 mg by mouth daily.    . Ferrous Sulfate (IRON) 28 MG TABS Take 28 mg by mouth daily.    . fluocinonide cream (LIDEX) 3.22 % Apply 1 application topically daily as needed (rash).    Marland Kitchen HYDROcodone-acetaminophen (NORCO) 5-325 MG tablet Take 1-2 tablets by mouth every 4 (four) hours as needed for moderate pain. 30 tablet 0  . lisinopril (PRINIVIL,ZESTRIL) 20 MG tablet Take 20 mg by mouth daily.    Marland Kitchen  omeprazole (PRILOSEC) 20 MG capsule Take 20 mg by mouth daily as needed (indigestion).     No current facility-administered medications for this encounter.     ECOG PERFORMANCE STATUS:  0 - Asymptomatic  REVIEW OF SYSTEMS:  Patient denies any  weight loss, fatigue, weakness, fever, chills or night sweats. Patient denies any loss of vision, blurred vision. Patient denies any ringing  of the ears or hearing loss. No irregular heartbeat. Patient denies heart murmur or history of fainting. Patient denies any chest pain or pain radiating to her upper extremities. Patient denies any shortness of breath, difficulty breathing at night, cough or hemoptysis. Patient denies any swelling in the lower legs. Patient denies any nausea vomiting, vomiting of blood, or coffee ground material in the vomitus. Patient denies any stomach pain. Patient states has had normal bowel movements no significant constipation or diarrhea. Patient denies any dysuria, hematuria or significant nocturia. Patient denies any problems walking, swelling in the joints or loss of balance. Patient denies any skin changes, loss of hair or loss of weight. Patient denies any excessive worrying or anxiety or significant depression. Patient denies any problems with insomnia. Patient denies excessive thirst, polyuria, polydipsia. Patient denies any swollen glands, patient denies easy bruising or easy bleeding. Patient denies any recent infections, allergies or URI. Patient "s visual fields have not changed significantly in recent time.    PHYSICAL EXAM: BP (!) 172/83   Pulse 75   Temp 98.3 F (36.8 C)   Resp 18   Wt 153 lb 14.1 oz (69.8 kg)   BMI 25.61 kg/m  Patient is wide local excision site in the upper inner quadrant of the breast which is healing well no dominant mass or nodularity is noted in either breast in 2 positions examined. No axillary or supraclavicular adenopathy is appreciated. Well-developed well-nourished patient in NAD. HEENT reveals PERLA, EOMI, discs not visualized.  Oral cavity is clear. No oral mucosal lesions are identified. Neck is clear without evidence of cervical or supraclavicular adenopathy. Lungs are clear to A&P. Cardiac examination is essentially  unremarkable with regular rate and rhythm without murmur rub or thrill. Abdomen is benign with no organomegaly or masses noted. Motor sensory and DTR levels are equal and symmetric in the upper and lower extremities. Cranial nerves II through XII are grossly intact. Proprioception is intact. No peripheral adenopathy or edema is identified. No motor or sensory levels are noted. Crude visual fields are within normal range.  LABORATORY DATA: Pathology reports reviewed    RADIOLOGY RESULTS: Mammograms and ultrasound reviewed   IMPRESSION: Stage I ER/PR positive HER-2/neu negative invasive mammary carcinoma of the right breast status post wide local excision and sentinel node biopsy in 76 year old female  PLAN: At this time I have recommended hypofractionated course over 4 weeks of radiation therapy to her right breast. Would also boost her scar another 1600 cGy based on her 4 mm margin. Risks and benefits of treatment including skin reaction fatigue alteration of blood counts possible inclusion of superficial lung all were discussed in detail with the patient. I first set up and ordered CT simulation for later this week. Patient also will be candidate for antiestrogen therapy after completion of radiation. Patient seems to comprehend my treatment plan well.  I would like to take this opportunity to thank you for allowing me to participate in the care of your patient.Armstead Peaks., MD

## 2016-09-02 ENCOUNTER — Ambulatory Visit
Admission: RE | Admit: 2016-09-02 | Discharge: 2016-09-02 | Disposition: A | Discharge status: Home / Self Care | Payer: PPO | Source: Ambulatory Visit | Attending: Radiation Oncology | Admitting: Radiation Oncology

## 2016-09-02 DIAGNOSIS — Z510 Encounter for antineoplastic radiation therapy: Secondary | ICD-10-CM | POA: Diagnosis not present

## 2016-09-02 DIAGNOSIS — C50211 Malignant neoplasm of upper-inner quadrant of right female breast: Secondary | ICD-10-CM | POA: Diagnosis not present

## 2016-09-02 DIAGNOSIS — Z170 Estrogen receptor positive status [ER+]: Secondary | ICD-10-CM | POA: Diagnosis not present

## 2016-09-05 DIAGNOSIS — Z923 Personal history of irradiation: Secondary | ICD-10-CM

## 2016-09-05 HISTORY — DX: Personal history of irradiation: Z92.3

## 2016-09-09 DIAGNOSIS — I252 Old myocardial infarction: Secondary | ICD-10-CM | POA: Diagnosis not present

## 2016-09-09 DIAGNOSIS — Z510 Encounter for antineoplastic radiation therapy: Secondary | ICD-10-CM | POA: Diagnosis not present

## 2016-09-09 DIAGNOSIS — Z7982 Long term (current) use of aspirin: Secondary | ICD-10-CM | POA: Diagnosis not present

## 2016-09-09 DIAGNOSIS — I2510 Atherosclerotic heart disease of native coronary artery without angina pectoris: Secondary | ICD-10-CM | POA: Diagnosis not present

## 2016-09-09 DIAGNOSIS — Z79899 Other long term (current) drug therapy: Secondary | ICD-10-CM | POA: Diagnosis not present

## 2016-09-09 DIAGNOSIS — I10 Essential (primary) hypertension: Secondary | ICD-10-CM | POA: Diagnosis not present

## 2016-09-09 DIAGNOSIS — E785 Hyperlipidemia, unspecified: Secondary | ICD-10-CM | POA: Diagnosis not present

## 2016-09-09 DIAGNOSIS — K219 Gastro-esophageal reflux disease without esophagitis: Secondary | ICD-10-CM | POA: Diagnosis not present

## 2016-09-09 DIAGNOSIS — Z9012 Acquired absence of left breast and nipple: Secondary | ICD-10-CM | POA: Diagnosis not present

## 2016-09-09 DIAGNOSIS — Z86010 Personal history of colonic polyps: Secondary | ICD-10-CM | POA: Diagnosis not present

## 2016-09-09 DIAGNOSIS — C50211 Malignant neoplasm of upper-inner quadrant of right female breast: Secondary | ICD-10-CM | POA: Diagnosis not present

## 2016-09-09 DIAGNOSIS — Z87891 Personal history of nicotine dependence: Secondary | ICD-10-CM | POA: Diagnosis not present

## 2016-09-09 DIAGNOSIS — Z170 Estrogen receptor positive status [ER+]: Secondary | ICD-10-CM | POA: Diagnosis not present

## 2016-09-10 ENCOUNTER — Other Ambulatory Visit: Payer: Self-pay | Admitting: *Deleted

## 2016-09-10 DIAGNOSIS — Z170 Estrogen receptor positive status [ER+]: Principal | ICD-10-CM

## 2016-09-10 DIAGNOSIS — C50211 Malignant neoplasm of upper-inner quadrant of right female breast: Secondary | ICD-10-CM

## 2016-09-13 ENCOUNTER — Ambulatory Visit
Admission: RE | Admit: 2016-09-13 | Discharge: 2016-09-13 | Disposition: A | Discharge status: Home / Self Care | Payer: PPO | Source: Ambulatory Visit | Attending: Radiation Oncology | Admitting: Radiation Oncology

## 2016-09-13 DIAGNOSIS — C50211 Malignant neoplasm of upper-inner quadrant of right female breast: Secondary | ICD-10-CM | POA: Diagnosis not present

## 2016-09-13 DIAGNOSIS — Z170 Estrogen receptor positive status [ER+]: Secondary | ICD-10-CM | POA: Diagnosis not present

## 2016-09-13 DIAGNOSIS — Z510 Encounter for antineoplastic radiation therapy: Secondary | ICD-10-CM | POA: Diagnosis not present

## 2016-09-14 ENCOUNTER — Ambulatory Visit
Admission: RE | Admit: 2016-09-14 | Discharge: 2016-09-14 | Disposition: A | Discharge status: Home / Self Care | Payer: PPO | Source: Ambulatory Visit | Attending: Radiation Oncology | Admitting: Radiation Oncology

## 2016-09-14 DIAGNOSIS — Z510 Encounter for antineoplastic radiation therapy: Secondary | ICD-10-CM | POA: Diagnosis not present

## 2016-09-15 ENCOUNTER — Ambulatory Visit
Admission: RE | Admit: 2016-09-15 | Discharge: 2016-09-15 | Disposition: A | Discharge status: Home / Self Care | Payer: PPO | Source: Ambulatory Visit | Attending: Radiation Oncology | Admitting: Radiation Oncology

## 2016-09-15 DIAGNOSIS — Z510 Encounter for antineoplastic radiation therapy: Secondary | ICD-10-CM | POA: Diagnosis not present

## 2016-09-16 ENCOUNTER — Ambulatory Visit
Admission: RE | Admit: 2016-09-16 | Discharge: 2016-09-16 | Disposition: A | Discharge status: Home / Self Care | Payer: PPO | Source: Ambulatory Visit | Attending: Radiation Oncology | Admitting: Radiation Oncology

## 2016-09-16 DIAGNOSIS — Z510 Encounter for antineoplastic radiation therapy: Secondary | ICD-10-CM | POA: Diagnosis not present

## 2016-09-17 ENCOUNTER — Ambulatory Visit
Admission: RE | Admit: 2016-09-17 | Discharge: 2016-09-17 | Disposition: A | Discharge status: Home / Self Care | Payer: PPO | Source: Ambulatory Visit | Attending: Radiation Oncology | Admitting: Radiation Oncology

## 2016-09-17 DIAGNOSIS — Z510 Encounter for antineoplastic radiation therapy: Secondary | ICD-10-CM | POA: Diagnosis not present

## 2016-09-20 ENCOUNTER — Ambulatory Visit
Admission: RE | Admit: 2016-09-20 | Discharge: 2016-09-20 | Disposition: A | Discharge status: Home / Self Care | Payer: PPO | Source: Ambulatory Visit | Attending: Radiation Oncology | Admitting: Radiation Oncology

## 2016-09-20 ENCOUNTER — Other Ambulatory Visit: Payer: Self-pay | Admitting: Family Medicine

## 2016-09-20 DIAGNOSIS — Z170 Estrogen receptor positive status [ER+]: Secondary | ICD-10-CM | POA: Diagnosis not present

## 2016-09-20 DIAGNOSIS — C50211 Malignant neoplasm of upper-inner quadrant of right female breast: Secondary | ICD-10-CM | POA: Diagnosis not present

## 2016-09-20 DIAGNOSIS — Z510 Encounter for antineoplastic radiation therapy: Secondary | ICD-10-CM | POA: Diagnosis not present

## 2016-09-20 DIAGNOSIS — I10 Essential (primary) hypertension: Secondary | ICD-10-CM

## 2016-09-21 ENCOUNTER — Ambulatory Visit
Admission: RE | Admit: 2016-09-21 | Discharge: 2016-09-21 | Disposition: A | Discharge status: Home / Self Care | Payer: PPO | Source: Ambulatory Visit | Attending: Radiation Oncology | Admitting: Radiation Oncology

## 2016-09-21 DIAGNOSIS — Z510 Encounter for antineoplastic radiation therapy: Secondary | ICD-10-CM | POA: Diagnosis not present

## 2016-09-22 ENCOUNTER — Ambulatory Visit
Admission: RE | Admit: 2016-09-22 | Discharge: 2016-09-22 | Disposition: A | Discharge status: Home / Self Care | Payer: PPO | Source: Ambulatory Visit | Attending: Radiation Oncology | Admitting: Radiation Oncology

## 2016-09-22 ENCOUNTER — Inpatient Hospital Stay: Payer: PPO | Attending: Oncology

## 2016-09-22 DIAGNOSIS — C50211 Malignant neoplasm of upper-inner quadrant of right female breast: Secondary | ICD-10-CM | POA: Insufficient documentation

## 2016-09-22 DIAGNOSIS — Z170 Estrogen receptor positive status [ER+]: Secondary | ICD-10-CM | POA: Diagnosis not present

## 2016-09-22 DIAGNOSIS — Z510 Encounter for antineoplastic radiation therapy: Secondary | ICD-10-CM | POA: Diagnosis not present

## 2016-09-22 LAB — CBC
HCT: 39.5 % (ref 35.0–47.0)
HEMOGLOBIN: 13.3 g/dL (ref 12.0–16.0)
MCH: 31.1 pg (ref 26.0–34.0)
MCHC: 33.7 g/dL (ref 32.0–36.0)
MCV: 92.2 fL (ref 80.0–100.0)
PLATELETS: 220 10*3/uL (ref 150–440)
RBC: 4.28 MIL/uL (ref 3.80–5.20)
RDW: 13.8 % (ref 11.5–14.5)
WBC: 5.6 10*3/uL (ref 3.6–11.0)

## 2016-09-23 ENCOUNTER — Ambulatory Visit
Admission: RE | Admit: 2016-09-23 | Discharge: 2016-09-23 | Disposition: A | Discharge status: Home / Self Care | Payer: PPO | Source: Ambulatory Visit | Attending: Radiation Oncology | Admitting: Radiation Oncology

## 2016-09-23 DIAGNOSIS — Z510 Encounter for antineoplastic radiation therapy: Secondary | ICD-10-CM | POA: Diagnosis not present

## 2016-09-24 ENCOUNTER — Ambulatory Visit
Admission: RE | Admit: 2016-09-24 | Discharge: 2016-09-24 | Disposition: A | Discharge status: Home / Self Care | Payer: PPO | Source: Ambulatory Visit | Attending: Radiation Oncology | Admitting: Radiation Oncology

## 2016-09-24 DIAGNOSIS — Z510 Encounter for antineoplastic radiation therapy: Secondary | ICD-10-CM | POA: Diagnosis not present

## 2016-09-27 ENCOUNTER — Ambulatory Visit
Admission: RE | Admit: 2016-09-27 | Discharge: 2016-09-27 | Disposition: A | Discharge status: Home / Self Care | Payer: PPO | Source: Ambulatory Visit | Attending: Radiation Oncology | Admitting: Radiation Oncology

## 2016-09-27 DIAGNOSIS — C50211 Malignant neoplasm of upper-inner quadrant of right female breast: Secondary | ICD-10-CM | POA: Diagnosis not present

## 2016-09-27 DIAGNOSIS — Z510 Encounter for antineoplastic radiation therapy: Secondary | ICD-10-CM | POA: Diagnosis not present

## 2016-09-27 DIAGNOSIS — Z170 Estrogen receptor positive status [ER+]: Secondary | ICD-10-CM | POA: Diagnosis not present

## 2016-09-28 ENCOUNTER — Ambulatory Visit
Admission: RE | Admit: 2016-09-28 | Discharge: 2016-09-28 | Disposition: A | Discharge status: Home / Self Care | Payer: PPO | Source: Ambulatory Visit | Attending: Radiation Oncology | Admitting: Radiation Oncology

## 2016-09-28 DIAGNOSIS — Z170 Estrogen receptor positive status [ER+]: Secondary | ICD-10-CM | POA: Diagnosis not present

## 2016-09-28 DIAGNOSIS — C50211 Malignant neoplasm of upper-inner quadrant of right female breast: Secondary | ICD-10-CM | POA: Diagnosis not present

## 2016-09-28 DIAGNOSIS — Z510 Encounter for antineoplastic radiation therapy: Secondary | ICD-10-CM | POA: Diagnosis not present

## 2016-09-29 ENCOUNTER — Ambulatory Visit
Admission: RE | Admit: 2016-09-29 | Discharge: 2016-09-29 | Disposition: A | Discharge status: Home / Self Care | Payer: PPO | Source: Ambulatory Visit | Attending: Radiation Oncology | Admitting: Radiation Oncology

## 2016-09-29 DIAGNOSIS — Z170 Estrogen receptor positive status [ER+]: Secondary | ICD-10-CM | POA: Diagnosis not present

## 2016-09-29 DIAGNOSIS — Z510 Encounter for antineoplastic radiation therapy: Secondary | ICD-10-CM | POA: Diagnosis not present

## 2016-09-29 DIAGNOSIS — C50211 Malignant neoplasm of upper-inner quadrant of right female breast: Secondary | ICD-10-CM | POA: Diagnosis not present

## 2016-09-30 ENCOUNTER — Ambulatory Visit
Admission: RE | Admit: 2016-09-30 | Discharge: 2016-09-30 | Disposition: A | Discharge status: Home / Self Care | Payer: PPO | Source: Ambulatory Visit | Attending: Radiation Oncology | Admitting: Radiation Oncology

## 2016-09-30 DIAGNOSIS — Z510 Encounter for antineoplastic radiation therapy: Secondary | ICD-10-CM | POA: Diagnosis not present

## 2016-10-01 ENCOUNTER — Ambulatory Visit
Admission: RE | Admit: 2016-10-01 | Discharge: 2016-10-01 | Disposition: A | Discharge status: Home / Self Care | Payer: PPO | Source: Ambulatory Visit | Attending: Radiation Oncology | Admitting: Radiation Oncology

## 2016-10-01 DIAGNOSIS — Z510 Encounter for antineoplastic radiation therapy: Secondary | ICD-10-CM | POA: Diagnosis not present

## 2016-10-04 ENCOUNTER — Ambulatory Visit
Admission: RE | Admit: 2016-10-04 | Discharge: 2016-10-04 | Disposition: A | Discharge status: Home / Self Care | Payer: PPO | Source: Ambulatory Visit | Attending: Radiation Oncology | Admitting: Radiation Oncology

## 2016-10-04 DIAGNOSIS — Z510 Encounter for antineoplastic radiation therapy: Secondary | ICD-10-CM | POA: Diagnosis not present

## 2016-10-04 DIAGNOSIS — Z170 Estrogen receptor positive status [ER+]: Secondary | ICD-10-CM | POA: Diagnosis not present

## 2016-10-04 DIAGNOSIS — C50211 Malignant neoplasm of upper-inner quadrant of right female breast: Secondary | ICD-10-CM | POA: Diagnosis not present

## 2016-10-05 ENCOUNTER — Ambulatory Visit
Admission: RE | Admit: 2016-10-05 | Discharge: 2016-10-05 | Disposition: A | Discharge status: Home / Self Care | Payer: PPO | Source: Ambulatory Visit | Attending: Radiation Oncology | Admitting: Radiation Oncology

## 2016-10-05 DIAGNOSIS — Z510 Encounter for antineoplastic radiation therapy: Secondary | ICD-10-CM | POA: Diagnosis not present

## 2016-10-06 ENCOUNTER — Other Ambulatory Visit: Payer: Self-pay

## 2016-10-06 ENCOUNTER — Ambulatory Visit
Admission: RE | Admit: 2016-10-06 | Discharge: 2016-10-06 | Disposition: A | Discharge status: Home / Self Care | Payer: PPO | Source: Ambulatory Visit | Attending: Radiation Oncology | Admitting: Radiation Oncology

## 2016-10-06 ENCOUNTER — Inpatient Hospital Stay: Payer: PPO | Attending: Oncology

## 2016-10-06 DIAGNOSIS — C50211 Malignant neoplasm of upper-inner quadrant of right female breast: Secondary | ICD-10-CM | POA: Diagnosis not present

## 2016-10-06 DIAGNOSIS — Z79899 Other long term (current) drug therapy: Secondary | ICD-10-CM | POA: Diagnosis not present

## 2016-10-06 DIAGNOSIS — I10 Essential (primary) hypertension: Secondary | ICD-10-CM | POA: Insufficient documentation

## 2016-10-06 DIAGNOSIS — Z87891 Personal history of nicotine dependence: Secondary | ICD-10-CM | POA: Diagnosis not present

## 2016-10-06 DIAGNOSIS — G8918 Other acute postprocedural pain: Secondary | ICD-10-CM | POA: Insufficient documentation

## 2016-10-06 DIAGNOSIS — Z170 Estrogen receptor positive status [ER+]: Secondary | ICD-10-CM | POA: Insufficient documentation

## 2016-10-06 DIAGNOSIS — Z510 Encounter for antineoplastic radiation therapy: Secondary | ICD-10-CM | POA: Diagnosis not present

## 2016-10-06 DIAGNOSIS — I252 Old myocardial infarction: Secondary | ICD-10-CM | POA: Diagnosis not present

## 2016-10-06 DIAGNOSIS — K219 Gastro-esophageal reflux disease without esophagitis: Secondary | ICD-10-CM | POA: Insufficient documentation

## 2016-10-06 DIAGNOSIS — Z79811 Long term (current) use of aromatase inhibitors: Secondary | ICD-10-CM | POA: Insufficient documentation

## 2016-10-06 DIAGNOSIS — Z800 Family history of malignant neoplasm of digestive organs: Secondary | ICD-10-CM | POA: Insufficient documentation

## 2016-10-06 DIAGNOSIS — Z7982 Long term (current) use of aspirin: Secondary | ICD-10-CM | POA: Diagnosis not present

## 2016-10-06 DIAGNOSIS — Z801 Family history of malignant neoplasm of trachea, bronchus and lung: Secondary | ICD-10-CM | POA: Diagnosis not present

## 2016-10-06 DIAGNOSIS — E785 Hyperlipidemia, unspecified: Secondary | ICD-10-CM | POA: Diagnosis not present

## 2016-10-06 DIAGNOSIS — Z880 Allergy status to penicillin: Secondary | ICD-10-CM | POA: Diagnosis not present

## 2016-10-06 DIAGNOSIS — I2510 Atherosclerotic heart disease of native coronary artery without angina pectoris: Secondary | ICD-10-CM | POA: Diagnosis not present

## 2016-10-06 LAB — CBC
HEMATOCRIT: 38.2 % (ref 35.0–47.0)
HEMOGLOBIN: 13 g/dL (ref 12.0–16.0)
MCH: 31.5 pg (ref 26.0–34.0)
MCHC: 33.9 g/dL (ref 32.0–36.0)
MCV: 92.8 fL (ref 80.0–100.0)
Platelets: 185 10*3/uL (ref 150–440)
RBC: 4.12 MIL/uL (ref 3.80–5.20)
RDW: 14.4 % (ref 11.5–14.5)
WBC: 5 10*3/uL (ref 3.6–11.0)

## 2016-10-07 ENCOUNTER — Ambulatory Visit
Admission: RE | Admit: 2016-10-07 | Discharge: 2016-10-07 | Disposition: A | Discharge status: Home / Self Care | Payer: PPO | Source: Ambulatory Visit | Attending: Radiation Oncology | Admitting: Radiation Oncology

## 2016-10-07 DIAGNOSIS — Z510 Encounter for antineoplastic radiation therapy: Secondary | ICD-10-CM | POA: Diagnosis not present

## 2016-10-08 ENCOUNTER — Ambulatory Visit: Payer: PPO

## 2016-10-11 ENCOUNTER — Ambulatory Visit
Admission: RE | Admit: 2016-10-11 | Discharge: 2016-10-11 | Disposition: A | Discharge status: Home / Self Care | Payer: PPO | Source: Ambulatory Visit | Attending: Radiation Oncology | Admitting: Radiation Oncology

## 2016-10-11 DIAGNOSIS — Z510 Encounter for antineoplastic radiation therapy: Secondary | ICD-10-CM | POA: Diagnosis not present

## 2016-10-12 ENCOUNTER — Ambulatory Visit
Admission: RE | Admit: 2016-10-12 | Discharge: 2016-10-12 | Disposition: A | Discharge status: Home / Self Care | Payer: PPO | Source: Ambulatory Visit | Attending: Radiation Oncology | Admitting: Radiation Oncology

## 2016-10-12 DIAGNOSIS — C50211 Malignant neoplasm of upper-inner quadrant of right female breast: Secondary | ICD-10-CM | POA: Diagnosis not present

## 2016-10-12 DIAGNOSIS — Z170 Estrogen receptor positive status [ER+]: Secondary | ICD-10-CM | POA: Diagnosis not present

## 2016-10-12 DIAGNOSIS — Z510 Encounter for antineoplastic radiation therapy: Secondary | ICD-10-CM | POA: Diagnosis not present

## 2016-10-13 ENCOUNTER — Ambulatory Visit
Admission: RE | Admit: 2016-10-13 | Discharge: 2016-10-13 | Disposition: A | Discharge status: Home / Self Care | Payer: PPO | Source: Ambulatory Visit | Attending: Radiation Oncology | Admitting: Radiation Oncology

## 2016-10-13 DIAGNOSIS — Z510 Encounter for antineoplastic radiation therapy: Secondary | ICD-10-CM | POA: Diagnosis not present

## 2016-10-14 ENCOUNTER — Ambulatory Visit
Admission: RE | Admit: 2016-10-14 | Discharge: 2016-10-14 | Disposition: A | Discharge status: Home / Self Care | Payer: PPO | Source: Ambulatory Visit | Attending: Radiation Oncology | Admitting: Radiation Oncology

## 2016-10-14 DIAGNOSIS — Z510 Encounter for antineoplastic radiation therapy: Secondary | ICD-10-CM | POA: Diagnosis not present

## 2016-10-15 ENCOUNTER — Ambulatory Visit
Admission: RE | Admit: 2016-10-15 | Discharge: 2016-10-15 | Disposition: A | Discharge status: Home / Self Care | Payer: PPO | Source: Ambulatory Visit | Attending: Radiation Oncology | Admitting: Radiation Oncology

## 2016-10-15 ENCOUNTER — Ambulatory Visit: Payer: PPO

## 2016-10-15 DIAGNOSIS — Z510 Encounter for antineoplastic radiation therapy: Secondary | ICD-10-CM | POA: Diagnosis not present

## 2016-10-18 ENCOUNTER — Ambulatory Visit
Admission: RE | Admit: 2016-10-18 | Discharge: 2016-10-18 | Disposition: A | Discharge status: Home / Self Care | Payer: PPO | Source: Ambulatory Visit | Attending: Radiation Oncology | Admitting: Radiation Oncology

## 2016-10-18 DIAGNOSIS — Z510 Encounter for antineoplastic radiation therapy: Secondary | ICD-10-CM | POA: Diagnosis not present

## 2016-10-20 NOTE — Progress Notes (Signed)
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  INTERVAL HISTORY: Patient returns to clinic today for further evaluation for breast cancer. She continues to have mild breast tenderness at her surgical site but otherwise feels well. She reports that her husband has been in poor health recently and she has been taking care of him. She has no neurologic complaints. She denies any recent fevers or illnesses. She has a good appetite and denies weight loss. She has no chest pain or shortness of breath. She denies any nausea, vomiting, constipation, or diarrhea. She has no urinary complaints. Patient otherwise feels well and offers no further specific complaints.   REVIEW OF SYSTEMS:   Review of Systems  Constitutional: Negative.  Negative for fever, malaise/fatigue and weight loss.  Respiratory: Negative.  Negative for cough and shortness of breath.   Cardiovascular: Negative.  Negative for chest pain and leg swelling.  Gastrointestinal: Negative.  Negative for abdominal pain.  Genitourinary: Negative.   Musculoskeletal: Negative.   Skin: Negative.  Negative for rash.  Neurological: Negative.  Negative for sensory change and weakness.  Psychiatric/Behavioral: Negative.  The patient is not nervous/anxious.     As per HPI. Otherwise, a complete review of systems is negative.  PAST MEDICAL HISTORY: Past Medical History:    Diagnosis Date  . Cancer ([place]) 07/23/2016   T1b, N0; ER/PR+, 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 Mucin noted on biopsy, not on wide excision.  . Coronary artery disease   . GERD (gastroesophageal reflux disease)   . Hyperlipidemia   . Hypertension   . MI (myocardial infarction) ([place])    2015    PAST SURGICAL HISTORY: Past Surgical History:  Procedure Laterality Date  . ABDOMINAL HYSTERECTOMY    . APPENDECTOMY    . BREAST BIOPSY Right 07/23/2016   INVASIVE MAMMARY CARCINOMA WITH AREAS OF EXTRACELLULAR MUCIN  . COLONOSCOPY  2016  . CORONARY ANGIOPLASTY WITH STENT PLACEMENT    . CYSTOSCOPY    . PARTIAL MASTECTOMY WITH AXILLARY SENTINEL LYMPH NODE BIOPSY Right 08/09/2016   Procedure: PARTIAL MASTECTOMY WITH AXILLARY SENTINEL LYMPH NODE BIOPSY;  Surgeon: Robert Bellow, MD;  Location: [facility] ORS;  Service: General;  Laterality: Right;  . TONSILLECTOMY      FAMILY HISTORY: Family History  Problem Relation Age of Onset  . Hyperlipidemia Mother   . Allergies Mother   . Cerebral aneurysm Mother        cause of death at age 92  . Heart disease Father        Fatal MI ag 48  . Cancer Brother        lung cancer  . Hyperlipidemia Brother   . Colonic polyp Brother   . Healthy Son   . Cancer Brother        colon  . Healthy Son     ADVANCED DIRECTIVES (Y/N):  N  HEALTH MAINTENANCE: Social History  Substance Use Topics  . Smoking status: Former Smoker    Packs/day: 0.25  Years: 10.00    Types: Cigarettes    Quit date: 03/06/1998  . Smokeless tobacco: Never Used  . Alcohol use No     Colonoscopy:  PAP:  Bone density: 2013  Lipid panel:  Allergies  Allergen Reactions  . Celebrex  [Celecoxib]     Dark stools.  . Penicillins Other (See Comments)    Childhood reaction Has patient had a PCN reaction causing immediate rash, facial/tongue/throat swelling, SOB or lightheadedness with hypotension: Unknown Has patient had a PCN reaction causing severe rash  involving mucus membranes or skin necrosis: Unknown Has patient had a PCN reaction that required hospitalization: Unknown Has patient had a PCN reaction occurring within the last 10 years: Unknown If all of the above answers are "NO", then may proceed with Cephalosporin use.   Marland Kitchen Prevacid  [Lansoprazole] Diarrhea    Current Outpatient Prescriptions  Medication Sig Dispense Refill  . amLODipine (NORVASC) 5 MG tablet TAKE 1 TABLET BY MOUTH EVERY DAY 30 tablet 12  . aspirin EC 81 MG tablet Take 81 mg by mouth daily.    . Ferrous Sulfate (IRON) 28 MG TABS Take 28 mg by mouth daily.    . fluocinonide cream (LIDEX) 2.63 % Apply 1 application topically daily as needed (rash).    Marland Kitchen HYDROcodone-acetaminophen (NORCO) 5-325 MG tablet Take 1-2 tablets by mouth every 4 (four) hours as needed for moderate pain. 30 tablet 0  . lisinopril (PRINIVIL,ZESTRIL) 20 MG tablet Take 20 mg by mouth daily.    Marland Kitchen omeprazole (PRILOSEC) 20 MG capsule Take 20 mg by mouth daily as needed (indigestion).    Marland Kitchen letrozole (FEMARA) 2.5 MG tablet Take 1 tablet (2.5 mg total) by mouth daily. 90 tablet 3   No current facility-administered medications for this visit.     OBJECTIVE: Vitals:   10/21/16 1159  BP: 123/74  Pulse: 82  Resp: 18  Temp: (!) 97.2 F (36.2 C)     Body mass index is 25.38 kg/m.    ECOG FS:0 - Asymptomatic  General: Well-developed, well-nourished, no acute distress. Eyes: Pink conjunctiva, anicteric sclera. Breasts: Well healing surgical scar on right breast. Erythema and mild edema of right breast.  Lungs: Clear to auscultation bilaterally. Heart: Regular rate and rhythm. No rubs, murmurs, or gallops. Abdomen: Soft, nontender, nondistended. No organomegaly noted, normoactive bowel sounds. Musculoskeletal: No edema, cyanosis, or clubbing. Neuro: Alert, answering all questions appropriately. Cranial nerves grossly intact. Skin: No rashes or petechiae noted. Psych: Normal affect.   LAB  RESULTS:  Lab Results  Component Value Date   NA 145 (H) 07/29/2016   K 4.4 07/29/2016   CL 105 07/29/2016   CO2 25 07/29/2016   GLUCOSE 84 07/29/2016   BUN 18 07/29/2016   CREATININE 0.88 07/29/2016   CALCIUM 9.3 07/29/2016   PROT 6.4 07/29/2016   ALBUMIN 3.9 07/29/2016   AST 13 07/29/2016   ALT 9 07/29/2016   ALKPHOS 68 07/29/2016   BILITOT 0.7 07/29/2016   GFRNONAA 64 07/29/2016   GFRAA 73 07/29/2016    Lab Results  Component Value Date   WBC 5.0 10/06/2016   NEUTROABS 4.1 07/29/2016   HGB 13.0 10/06/2016   HCT 38.2 10/06/2016   MCV 92.8 10/06/2016   PLT 185 10/06/2016     STUDIES: No results found.  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  PLAN:    1. Pathologic stage IA ER/PR positive, HER-2 negative invasive carcinoma  of the upper inner quadrant of the right breast: Patient had a lumpectomy completed on August 09, 2016 confirming the above stated breast cancer. Given the size of the tumor, no Oncotype DX was ordered and no adjuvant chemotherapy is recommended. She completed XRT on 10/18/16. She will start letrozole and continue for next 5 years completing August 2023. 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 She will start OTC calcium 1241m and vitamin D 800 u once a day. Will repeat DEXA scan for baseline with letrozole. Return to clinic for further evaluation in 3 months.   Approximately 20 minutes was spent in discussion of which greater than 50% was consultation.  Patient expressed understanding and was in agreement with this plan. She also understands that She can call clinic at any time with any questions, concerns, or complaints.   Cancer Staging Malignant neoplasm of upper-inner quadrant of right breast in female, estrogen receptor positive ([facility] Staging form: Breast, AJCC 8th Edition - Pathologic stage from 08/30/2016: Stage IA (pT1b, pN0, cM0, G2, ER:  Positive, PR: Positive, HER2: Negative) - Signed by FLloyd Huger MD on 08/30/2016  LBeverely Risen AZenia Resides NP 10/21/16 12:32 PM  Patient was seen and evaluated independently and I agree with the assessment and plan as dictated above.  TLloyd Huger MD 10/22/16 1:25 PM

## 2016-10-21 ENCOUNTER — Inpatient Hospital Stay (HOSPITAL_BASED_OUTPATIENT_CLINIC_OR_DEPARTMENT_OTHER): Payer: PPO | Admitting: Oncology

## 2016-10-21 VITALS — BP 123/74 | HR 82 | Temp 97.2°F | Resp 18 | Wt 152.5 lb

## 2016-10-21 DIAGNOSIS — Z170 Estrogen receptor positive status [ER+]: Secondary | ICD-10-CM | POA: Diagnosis not present

## 2016-10-21 DIAGNOSIS — Z87891 Personal history of nicotine dependence: Secondary | ICD-10-CM

## 2016-10-21 DIAGNOSIS — Z79899 Other long term (current) drug therapy: Secondary | ICD-10-CM | POA: Diagnosis not present

## 2016-10-21 DIAGNOSIS — I10 Essential (primary) hypertension: Secondary | ICD-10-CM | POA: Diagnosis not present

## 2016-10-21 DIAGNOSIS — I252 Old myocardial infarction: Secondary | ICD-10-CM

## 2016-10-21 DIAGNOSIS — Z79811 Long term (current) use of aromatase inhibitors: Secondary | ICD-10-CM

## 2016-10-21 DIAGNOSIS — E785 Hyperlipidemia, unspecified: Secondary | ICD-10-CM

## 2016-10-21 DIAGNOSIS — I2510 Atherosclerotic heart disease of native coronary artery without angina pectoris: Secondary | ICD-10-CM | POA: Diagnosis not present

## 2016-10-21 DIAGNOSIS — C50211 Malignant neoplasm of upper-inner quadrant of right female breast: Secondary | ICD-10-CM

## 2016-10-21 DIAGNOSIS — Z800 Family history of malignant neoplasm of digestive organs: Secondary | ICD-10-CM

## 2016-10-21 DIAGNOSIS — K219 Gastro-esophageal reflux disease without esophagitis: Secondary | ICD-10-CM | POA: Diagnosis not present

## 2016-10-21 DIAGNOSIS — Z7982 Long term (current) use of aspirin: Secondary | ICD-10-CM

## 2016-10-21 DIAGNOSIS — Z880 Allergy status to penicillin: Secondary | ICD-10-CM

## 2016-10-21 DIAGNOSIS — G8918 Other acute postprocedural pain: Secondary | ICD-10-CM | POA: Diagnosis not present

## 2016-10-21 DIAGNOSIS — Z801 Family history of malignant neoplasm of trachea, bronchus and lung: Secondary | ICD-10-CM

## 2016-10-21 MED ORDER — LETROZOLE 2.5 MG PO TABS: 2.5000 mg | ORAL_TABLET | Freq: Every day | ORAL | 3 refills | Status: DC

## 2016-10-21 NOTE — Progress Notes (Signed)
Patient is here today for follow up, she is doing well she does mention some tenderness on the right breast where she had a biopsy 5 weeks ago.

## 2016-11-18 ENCOUNTER — Ambulatory Visit: Payer: PPO | Admitting: Radiation Oncology

## 2016-11-22 ENCOUNTER — Encounter: Payer: Self-pay | Admitting: General Surgery

## 2016-11-22 ENCOUNTER — Ambulatory Visit (INDEPENDENT_AMBULATORY_CARE_PROVIDER_SITE_OTHER): Payer: PPO | Admitting: Family Medicine

## 2016-11-22 ENCOUNTER — Ambulatory Visit (INDEPENDENT_AMBULATORY_CARE_PROVIDER_SITE_OTHER): Payer: PPO | Admitting: General Surgery

## 2016-11-22 VITALS — BP 106/68 | HR 70 | Resp 12 | Ht 64.0 in | Wt 156.0 lb

## 2016-11-22 VITALS — BP 122/60 | HR 78 | Resp 16 | Wt 157.0 lb

## 2016-11-22 DIAGNOSIS — C50211 Malignant neoplasm of upper-inner quadrant of right female breast: Secondary | ICD-10-CM

## 2016-11-22 DIAGNOSIS — I2510 Atherosclerotic heart disease of native coronary artery without angina pectoris: Secondary | ICD-10-CM | POA: Diagnosis not present

## 2016-11-22 DIAGNOSIS — I10 Essential (primary) hypertension: Secondary | ICD-10-CM

## 2016-11-22 DIAGNOSIS — Z170 Estrogen receptor positive status [ER+]: Secondary | ICD-10-CM

## 2016-11-22 NOTE — Progress Notes (Signed)
[name] [name]  MRN: [id number] DOB: 1940-07-30  Subjective:  HPI   The patient is a 76 year old female who presents for follow up of her hypertension.  She was last seen on 07/29/16.  No medication changes were made at that time her blood pressure readings have been as follows:  BP Readings from Last 3 Encounters:  11/22/16 122/60  11/22/16 106/68  10/21/16 123/74   Since that last visit the patient has been undergoing treatment for breast cancer.  She has finished her radiation and is currently cancer free.   Patient Active Problem List   Diagnosis Date Noted  . Malignant neoplasm of upper-inner quadrant of right breast in female, estrogen receptor positive ([place]) 07/28/2016  . Gastric ulcer requiring drug therapy, chronic 01/23/2016  . MI (mitral incompetence) 07/22/2015  . Combined fat and carbohydrate induced hyperlipemia 12/26/2014  . UTI (lower urinary tract infection) 08/18/2014  . Blurry vision 08/18/2014  . Allergic rhinitis 07/12/2014  . Absolute anemia 07/12/2014  . Baker's cyst of knee 07/12/2014  . Atherosclerosis of coronary artery 07/12/2014  . CAFL (chronic airflow limitation) ([place]) 07/12/2014  . Dizziness 07/12/2014  . Essential (primary) hypertension 07/12/2014  . Acid reflux 07/12/2014  . 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 History of colon polyps 07/12/2014  . Hypercholesteremia 07/12/2014  . Malaise and fatigue 07/12/2014  . Heart attack ([place]) 07/12/2014  . Muscle ache 07/12/2014  . Arthritis, degenerative 07/12/2014  . Peripheral blood vessel disorder ([place]) 07/12/2014  . Breath shortness 07/12/2014  . Bradycardia 08/23/2013  . Arteriosclerosis of coronary artery 08/17/2013  . Diabetes ([place]) 08/17/2013  . Diabetes mellitus ([place]) 08/17/2013  . Peripheral vascular disease ([place]) 08/17/2013    Past Medical History:  Diagnosis Date  . Cancer ([place]) 07/23/2016   T1b, N0; ER/PR+, 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 Mucin noted on biopsy,  not on wide excision.  . Coronary artery disease   . GERD (gastroesophageal reflux disease)   . Hyperlipidemia   . Hypertension   . MI (myocardial infarction) ([place])    2015    Social History   Social History  . Marital status: Married    Spouse name: [name]  . Number of children: 2  . Years of education: N/A   Occupational History  . retired    Social History Main Topics  . Smoking status: Former Smoker    Packs/day: 0.25    Years: 10.00    Types: Cigarettes    Quit date: 03/06/1998  . Smokeless tobacco: Never Used  . Alcohol use No  . Drug use: No  . Sexual activity: Yes    Birth control/ protection: Surgical   Other Topics Concern  . Not on file   Social History Narrative  . No narrative on file    Outpatient Encounter Prescriptions as of 11/22/2016  Medication Sig Note  . amLODipine (NORVASC) 5 MG tablet TAKE 1 TABLET BY MOUTH EVERY DAY   . aspirin EC 81 MG tablet Take 81 mg by mouth daily. 08/18/2014: Received from: [place]: Take 81 mg by mouth.  . Ferrous Sulfate (IRON) 28 MG TABS Take 28 mg by mouth daily.   . fluocinonide cream (LIDEX) 8.84 % Apply 1 application topically daily as needed (rash).   Marland Kitchen letrozole (FEMARA) 2.5 MG tablet Take 1 tablet (2.5 mg total) by mouth daily.   Marland Kitchen lisinopril (PRINIVIL,ZESTRIL) 20 MG tablet TAKE 1 TABLET BY MOUTH EVERY DAY   . omeprazole (PRILOSEC) 20 MG  capsule Take 20 mg by mouth daily as needed (indigestion).    No facility-administered encounter medications on file as of 11/22/2016.     Allergies  Allergen Reactions  . Celebrex  [Celecoxib]     Dark stools.  . Penicillins Other (See Comments)    Childhood reaction Has patient had a PCN reaction causing immediate rash, facial/tongue/throat swelling, SOB or lightheadedness with hypotension: Unknown Has patient had a PCN reaction causing severe rash involving mucus membranes or skin necrosis: Unknown Has patient had a PCN reaction that required  hospitalization: Unknown Has patient had a PCN reaction occurring within the last 10 years: Unknown If all of the above answers are "NO", then may proceed with Cephalosporin use.   Marland Kitchen Prevacid  [Lansoprazole] Diarrhea    Review of Systems  Constitutional: Positive for diaphoresis (Patient recently placed on Femara). Negative for fever and malaise/fatigue.  Eyes: Negative.   Respiratory: Negative for cough, shortness of breath and wheezing.   Cardiovascular: Negative for chest pain, palpitations, orthopnea and leg swelling.  Gastrointestinal: Negative.   Skin: Negative.   Neurological: Negative for dizziness, weakness and headaches.  Endo/Heme/Allergies: Negative.   Psychiatric/Behavioral: Negative.     Objective:  BP 122/60 (BP Location: Right Arm, Patient Position: Sitting, Cuff Size: Normal)   Pulse 78   Resp 16   Wt 157 lb (71.2 kg)   BMI 26.95 kg/m   Physical Exam  Constitutional: She is oriented to person, place, and time and well-developed, well-nourished, and in no distress.  HENT:  Head: Normocephalic and atraumatic.  Eyes: Conjunctivae are normal. No scleral icterus.  Neck: No thyromegaly present.  Cardiovascular: Normal rate, regular rhythm and normal heart sounds.   Pulmonary/Chest: Effort normal and breath sounds normal.  Abdominal: Soft.  Neurological: She is alert and oriented to person, place, and time. Gait normal. GCS score is 15.  Skin: Skin is warm and dry.  Psychiatric: Mood, memory, affect and judgment normal.    Assessment and Plan :  HTN Breast Cancer  under treatment  Flu shot later this fall. GERD  I have done the exam and reviewed the chart and it is accurate to the best of my knowledge. Development worker, community has been used and  any errors in dictation or transcription are unintentional. Miguel Aschoff M.D. [facility]

## 2016-11-22 NOTE — Progress Notes (Signed)
Patient ID: [name] [name], female   DOB: 1940/04/09, 76 y.o.   MRN: [id number]  Chief Complaint  Patient presents with  . Follow-up    breast cancer    HPI [name] [name] is a 76 y.o. female is here today for a 3 month right breast cancer follow up. Patient states she is doing well. Patient completed radiation on 10/18/2016.  Patient is scheduled for bone density on 01/24/2017.  Patient has started the [facility] prescribed by Dr. Grayland Ormond.Marland Kitchen  HPI  Past Medical History:  Diagnosis Date  . Cancer ([name] [name]) 07/23/2016   T1b, N0; ER/PR+, 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 Mucin noted on biopsy, not on wide excision.  . Coronary artery disease   . GERD (gastroesophageal reflux disease)   . Hyperlipidemia   . Hypertension   . MI (myocardial infarction) ([place])    2015    Past Surgical History:  Procedure Laterality Date  . ABDOMINAL HYSTERECTOMY    . APPENDECTOMY    . BREAST BIOPSY Right 07/23/2016   INVASIVE MAMMARY CARCINOMA WITH AREAS OF EXTRACELLULAR MUCIN  . COLONOSCOPY  2016  . CORONARY ANGIOPLASTY WITH STENT PLACEMENT    . CYSTOSCOPY    . PARTIAL MASTECTOMY WITH AXILLARY SENTINEL LYMPH NODE BIOPSY Right 08/09/2016   Procedure: PARTIAL MASTECTOMY WITH AXILLARY SENTINEL LYMPH NODE BIOPSY;  Surgeon: Robert Bellow, MD;  Location: [facility] ORS;  Service: General;  Laterality: Right;  . TONSILLECTOMY      Family History  Problem Relation Age of Onset  . Hyperlipidemia Mother   . Allergies Mother   . Cerebral aneurysm Mother        cause of death at age 43  . Heart disease Father        Fatal MI ag 61  . Cancer Brother        lung cancer  . Hyperlipidemia Brother   . Colonic polyp Brother   . Healthy Son   . Cancer - Lung Brother        colon  . Healthy Son     Social History Social History  Substance Use Topics  . Smoking status: Former Smoker    Packs/day: 0.25    Years: 10.00    Types: Cigarettes    Quit date: 03/06/1998  . Smokeless tobacco:  Never Used  . Alcohol use No    Allergies  Allergen Reactions  . Celebrex  [Celecoxib]     Dark stools.  . Penicillins Other (See Comments)    Childhood reaction Has patient had a PCN reaction causing immediate rash, facial/tongue/throat swelling, SOB or lightheadedness with hypotension: Unknown Has patient had a PCN reaction causing severe rash involving mucus membranes or skin necrosis: Unknown Has patient had a PCN reaction that required hospitalization: Unknown Has patient had a PCN reaction occurring within the last 10 years: Unknown If all of the above answers are "NO", then may proceed with Cephalosporin use.   Marland Kitchen Prevacid  [Lansoprazole] Diarrhea    Current Outpatient Prescriptions  Medication Sig Dispense Refill  . amLODipine (NORVASC) 5 MG tablet TAKE 1 TABLET BY MOUTH EVERY DAY 30 tablet 12  . aspirin EC 81 MG tablet Take 81 mg by mouth daily.    . Ferrous Sulfate (IRON) 28 MG TABS Take 28 mg by mouth daily.    . fluocinonide cream (LIDEX) 7.25 % Apply 1 application topically daily as needed (rash).    Marland Kitchen letrozole (FEMARA) 2.5 MG tablet Take 1 tablet (2.5 mg total) by mouth  daily. 90 tablet 3  . lisinopril (PRINIVIL,ZESTRIL) 20 MG tablet TAKE 1 TABLET BY MOUTH EVERY DAY    . omeprazole (PRILOSEC) 20 MG capsule Take 20 mg by mouth daily as needed (indigestion).     No current facility-administered medications for this visit.     Review of Systems Review of Systems  Constitutional: Negative.   Respiratory: Negative.   Cardiovascular: Negative.     Blood pressure 106/68, pulse 70, resp. rate 12, height 5\' 4"  (1.626 m), weight 156 lb (70.8 kg).  Physical Exam Physical Exam  Constitutional: She is oriented to person, place, and time. She appears well-developed and well-nourished.  Eyes: Conjunctivae are normal. No scleral icterus.  Neck: Neck supple.  Cardiovascular: Normal rate, regular rhythm and normal heart sounds.   Pulmonary/Chest: Effort normal and breath  sounds normal. Right breast exhibits skin change. Right breast exhibits no inverted nipple, no mass, no nipple discharge and no tenderness. Left breast exhibits no inverted nipple, no mass, no nipple discharge, no skin change and no tenderness.    Lymphadenopathy:    She has no cervical adenopathy.    She has no axillary adenopathy.  Neurological: She is alert and oriented to person, place, and time.  Skin: Skin is warm and dry.      Assessment    Doing well status post right breast conservation.T1b,N0, ER/PR +, her 2 neu neg.     Plan        Patient to return in February, 2019 for bilateral diagnotic mammograms. The patient is aware to call back for any questions or concerns.  HPI, Physical Exam, Assessment and Plan have been scribed under the direction and in the presence of Hervey Ard, MD.  Gaspar Cola, CMA  I have completed the exam and reviewed the above documentation for accuracy and completeness.  I agree with the above.  Haematologist has been used and any errors in dictation or transcription are unintentional.  Hervey Ard, M.D., F.A.C.S.   Robert Bellow 11/23/2016, 5:42 AM

## 2016-11-22 NOTE — Patient Instructions (Signed)
  Patient to return in February, 2019 for bilateral diagnotic mammograms. The patient is aware to call back for any questions or concerns.

## 2016-11-24 ENCOUNTER — Encounter: Payer: Self-pay | Admitting: Family Medicine

## 2016-11-25 ENCOUNTER — Encounter: Payer: Self-pay | Admitting: Radiation Oncology

## 2016-11-25 ENCOUNTER — Ambulatory Visit
Admission: RE | Admit: 2016-11-25 | Discharge: 2016-11-25 | Disposition: A | Discharge status: Home / Self Care | Payer: PPO | Source: Ambulatory Visit | Attending: Radiation Oncology | Admitting: Radiation Oncology

## 2016-11-25 VITALS — BP 143/79 | HR 79 | Temp 95.1°F | Resp 20 | Wt 155.0 lb

## 2016-11-25 DIAGNOSIS — R232 Flushing: Secondary | ICD-10-CM | POA: Insufficient documentation

## 2016-11-25 DIAGNOSIS — Z170 Estrogen receptor positive status [ER+]: Secondary | ICD-10-CM | POA: Insufficient documentation

## 2016-11-25 DIAGNOSIS — Z923 Personal history of irradiation: Secondary | ICD-10-CM | POA: Diagnosis not present

## 2016-11-25 DIAGNOSIS — C50211 Malignant neoplasm of upper-inner quadrant of right female breast: Secondary | ICD-10-CM | POA: Diagnosis not present

## 2016-11-25 NOTE — Progress Notes (Signed)
Radiation Oncology Follow up Note  Name: [name] [name]   Date:   11/25/2016 MRN:  [id number] DOB: 12-31-1940    This 76 y.o. female presents to the clinic today for one-month follow-up status post whole breast radiation to her right breast for ER/PR positive invasive mammary carcinoma.  REFERRING PROVIDER: Jerrol Banana.,*  HPI: Patient is a 76 year old female now out 1 month having completed whole breast radiation to her right breast for stage I ER/PR positive invasive mammary carcinoma. Seen today in routine follow-up she is doing well. She specifically denies breast tenderness cough or bone pain.. She is been started on Femara tolerating that well without side effect except for some minor hot flashes.   COMPLICATIONS OF TREATMENT: none  FOLLOW UP COMPLIANCE: keeps appointments   PHYSICAL EXAM:  BP (!) 143/79   Pulse 79   Temp (!) 95.1 F (35.1 C)   Resp 20   Wt 154 lb 15.7 oz (70.3 kg)   BMI 26.60 kg/m  Lungs are clear to A&P cardiac examination essentially unremarkable with regular rate and rhythm. No dominant mass or nodularity is noted in either breast in 2 positions examined. Incision is well-healed. No axillary or supraclavicular adenopathy is appreciated. Cosmetic result is excellent. Well-developed well-nourished patient in NAD. HEENT reveals PERLA, EOMI, discs not visualized.  Oral cavity is clear. No oral mucosal lesions are identified. Neck is clear without evidence of cervical or supraclavicular adenopathy. Lungs are clear to A&P. Cardiac examination is essentially unremarkable with regular rate and rhythm without murmur rub or thrill. Abdomen is benign with no organomegaly or masses noted. Motor sensory and DTR levels are equal and symmetric in the upper and lower extremities. Cranial nerves II through XII are grossly intact. Proprioception is intact. No peripheral adenopathy or edema is identified. No motor or sensory levels are noted. Crude visual fields are  within normal range.  RADIOLOGY RESULTS: No current films for review  PLAN: Present time patient is doing well 1 month out from whole breast radiation. I'm please were overall progress. She continues on Femara without side effect. I've asked to see her back in 4-5 months for follow-up. She knows to call sooner with any concerns.  I would like to take this opportunity to thank you for allowing me to participate in the care of your patient.Armstead Peaks., MD

## 2017-01-13 ENCOUNTER — Ambulatory Visit: Payer: PPO

## 2017-01-20 ENCOUNTER — Ambulatory Visit: Payer: PPO

## 2017-01-20 ENCOUNTER — Ambulatory Visit (INDEPENDENT_AMBULATORY_CARE_PROVIDER_SITE_OTHER): Payer: PPO

## 2017-01-20 DIAGNOSIS — Z23 Encounter for immunization: Secondary | ICD-10-CM | POA: Diagnosis not present

## 2017-01-24 ENCOUNTER — Ambulatory Visit
Admission: RE | Admit: 2017-01-24 | Discharge: 2017-01-24 | Disposition: A | Discharge status: Home / Self Care | Payer: PPO | Source: Ambulatory Visit | Attending: Nurse Practitioner | Admitting: Nurse Practitioner

## 2017-01-24 ENCOUNTER — Ambulatory Visit: Payer: PPO | Admitting: Oncology

## 2017-01-24 DIAGNOSIS — C50211 Malignant neoplasm of upper-inner quadrant of right female breast: Secondary | ICD-10-CM | POA: Diagnosis present

## 2017-01-24 DIAGNOSIS — M8589 Other specified disorders of bone density and structure, multiple sites: Secondary | ICD-10-CM | POA: Diagnosis not present

## 2017-01-24 DIAGNOSIS — M8588 Other specified disorders of bone density and structure, other site: Secondary | ICD-10-CM | POA: Diagnosis not present

## 2017-01-24 DIAGNOSIS — Z780 Asymptomatic menopausal state: Secondary | ICD-10-CM | POA: Diagnosis not present

## 2017-01-24 DIAGNOSIS — Z170 Estrogen receptor positive status [ER+]: Secondary | ICD-10-CM

## 2017-01-26 ENCOUNTER — Ambulatory Visit: Payer: PPO | Admitting: Oncology

## 2017-01-30 NOTE — Progress Notes (Signed)
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  INTERVAL HISTORY: Patient returns to clinic today for routine 2-monthevaluation.  She is tolerating letrozole well without significant side effects. She has no neurologic complaints. She denies any recent fevers or illnesses. She has a good appetite and denies weight loss. She has no chest pain or shortness of breath. She denies any nausea, vomiting, constipation, or diarrhea. She has no urinary complaints. Patient offers no specific complaints today.  REVIEW OF SYSTEMS:   Review of Systems  Constitutional: Negative.  Negative for fever, malaise/fatigue and weight loss.  Respiratory: Negative.  Negative for cough and shortness of breath.   Cardiovascular: Negative.  Negative for chest pain and leg swelling.  Genitourinary: Negative.   Musculoskeletal: Negative.   Skin: Negative.  Negative for rash.  Neurological: Negative.  Negative for sensory change and weakness.  Psychiatric/Behavioral: Negative.  The patient is not nervous/anxious.     As per HPI. Otherwise, a complete review of systems is negative.  PAST MEDICAL HISTORY: Past Medical History:  Diagnosis Date  . Cancer ([facility] 07/23/2016   T1b, N0; ER/PR+, 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 Mucin noted on biopsy, not on wide excision.  . Coronary artery disease   . GERD (gastroesophageal reflux  disease)   . Hyperlipidemia   . Hypertension   . [place]    PAST SURGICAL HISTORY: Past Surgical History:  Procedure Laterality Date  . ABDOMINAL HYSTERECTOMY    . APPENDECTOMY    . BREAST BIOPSY Right 07/23/2016   INVASIVE MAMMARY CARCINOMA WITH AREAS OF EXTRACELLULAR MUCIN  . COLONOSCOPY  2016  . CORONARY ANGIOPLASTY WITH STENT PLACEMENT    . CYSTOSCOPY    . PARTIAL MASTECTOMY WITH AXILLARY SENTINEL LYMPH NODE BIOPSY Right 08/09/2016   Procedure: PARTIAL MASTECTOMY WITH AXILLARY SENTINEL LYMPH NODE BIOPSY;  Surgeon: BRobert Bellow MD;  Location: [facility] ORS;  Service: General;  Laterality: Right;  . TONSILLECTOMY      FAMILY HISTORY: Family History  Problem Relation Age of Onset  . Hyperlipidemia Mother   . Allergies Mother   . Cerebral aneurysm Mother        cause of death at age 78 . Heart disease Father        Fatal MI ag 743 . Cancer Brother        lung cancer  . Hyperlipidemia Brother   . Colonic polyp Brother   . Healthy Son   . Cancer - Lung Brother        colon  . Healthy Son     ADVANCED DIRECTIVES (Y/N):  N  HEALTH MAINTENANCE: Social History   Tobacco Use  . Smoking status: Former Smoker    Packs/day: 0.25    Years: 10.00    Pack years: 2.50    Types: Cigarettes    Last attempt to quit: 03/06/1998    Years since quitting: 18.9  . Smokeless tobacco: Never Used  Substance Use Topics  .  Alcohol use: No  . Drug use: No     Colonoscopy:  PAP:  Bone density:  Lipid panel:  Allergies  Allergen Reactions  . Celebrex  [Celecoxib]     Dark stools.  . Penicillins Other (See Comments)    Childhood reaction Has patient had a PCN reaction causing immediate rash, facial/tongue/throat swelling, SOB or lightheadedness with hypotension: Unknown Has patient had a PCN reaction causing severe rash involving mucus membranes or skin necrosis: Unknown Has patient had a PCN reaction that required hospitalization:  Unknown Has patient had a PCN reaction occurring within the last 10 years: Unknown If all of the above answers are "NO", then may proceed with Cephalosporin use.   Marland Kitchen Prevacid  [Lansoprazole] Diarrhea    Current Outpatient Medications  Medication Sig Dispense Refill  . amLODipine (NORVASC) 5 MG tablet TAKE 1 TABLET BY MOUTH EVERY DAY 30 tablet 12  . aspirin EC 81 MG tablet Take 81 mg by mouth daily.    . Ferrous Sulfate (IRON) 28 MG TABS Take 28 mg by mouth daily.    . fluocinonide cream (LIDEX) 9.62 % Apply 1 application topically daily as needed (rash).    Marland Kitchen letrozole (FEMARA) 2.5 MG tablet Take 1 tablet (2.5 mg total) by mouth daily. 90 tablet 3  . lisinopril (PRINIVIL,ZESTRIL) 20 MG tablet TAKE 1 TABLET BY MOUTH EVERY DAY    . omeprazole (PRILOSEC) 20 MG capsule Take 20 mg by mouth daily as needed (indigestion).     No current facility-administered medications for this visit.     OBJECTIVE: Vitals:   01/31/17 1150  BP: (!) 164/77  Pulse: 77  Resp: 18  Temp: 97.9 F (36.6 C)     Body mass index is 26.5 kg/m.    ECOG FS:0 - Asymptomatic  General: Well-developed, well-nourished, no acute distress. Eyes: Pink conjunctiva, anicteric sclera. Breasts: Well healing surgical scar on right breast. Lungs: Clear to auscultation bilaterally. Heart: Regular rate and rhythm. No rubs, murmurs, or gallops. Abdomen: Soft, nontender, nondistended. No organomegaly noted, normoactive bowel sounds. Musculoskeletal: No edema, cyanosis, or clubbing. Neuro: Alert, answering all questions appropriately. Cranial nerves grossly intact. Skin: No rashes or petechiae noted. Psych: Normal affect.   LAB RESULTS:  Lab Results  Component Value Date   NA 145 (H) 07/29/2016   K 4.4 07/29/2016   CL 105 07/29/2016   CO2 25 07/29/2016   GLUCOSE 84 07/29/2016   BUN 18 07/29/2016   CREATININE 0.88 07/29/2016   CALCIUM 9.3 07/29/2016   PROT 6.4 07/29/2016   ALBUMIN 3.9 07/29/2016   AST 13 07/29/2016    ALT 9 07/29/2016   ALKPHOS 68 07/29/2016   BILITOT 0.7 07/29/2016   GFRNONAA 64 07/29/2016   GFRAA 73 07/29/2016    Lab Results  Component Value Date   WBC 5.0 10/06/2016   NEUTROABS 4.1 07/29/2016   HGB 13.0 10/06/2016   HCT 38.2 10/06/2016   MCV 92.8 10/06/2016   PLT 185 10/06/2016     STUDIES: Dg Bone Density  Result Date: 01/24/2017 EXAM: DUAL X-RAY ABSORPTIOMETRY (DXA) FOR BONE MINERAL DENSITY IMPRESSION: Dear Dr. Zenia Resides, Your patient Sherral Rexrode completed a BMD test on 01/24/2017 using the [place] (analysis version: 14.10) manufactured by [employer]. The following summarizes the results of our evaluation. PATIENT BIOGRAPHICAL: Name: Alizah, Sills Patient ID: [id number] Birth Date: 07/11/1940 Height: 64.0 in. Gender: Female Exam Date: 01/24/2017 Weight: 153.0 lbs. Indications: Advanced Age, High Risk Meds, History of Breast Cancer, Hysterectomy, Oophorectomy  Bilateral, Postmenopausal Fractures: Treatments: Letrozole, Omeprazole ASSESSMENT: The BMD measured at Forearm Radius 33% is 0.721 g/cm2 with a T-score of -1.8. This patient is considered osteopenic according to [facility]) criteria. Lumbar spine was not utilized due to advanced degenerative changes/scoliosis. Site Region Measured Measured WHO Young Adult BMD Date       Age      Classification T-score DualFemur Total Right 01/24/2017 78.1 Normal -1.0 0.879 g/cm2 Left Forearm Radius 33% 01/24/2017 78.1 Osteopenia -1.8 0.721 g/cm2 World Health Organization [facility]) criteria for post-menopausal, Caucasian Women: Normal:       T-score at or above -1 SD Osteopenia:   T-score between -1 and -2.5 SD Osteoporosis: T-score at or below -2.5 SD RECOMMENDATIONS: [place] recommends that FDA-approved medical therapies be considered in postmenopausal women and men age 29 or older with a: 1. Hip or vertebral (clinical or morphometric) fracture. 2. T-score of < -2.5 at the spine or  hip. 3. Ten-year fracture probability by FRAX of 3% or greater for hip fracture or 20% or greater for major osteoporotic fracture. All treatment decisions require clinical judgment and consideration of individual patient factors, including patient preferences, co-morbidities, previous drug use, risk factors not captured in the FRAX model (e.g. falls, vitamin D deficiency, increased bone turnover, interval significant decline in bone density) and possible under - or over-estimation of fracture risk by FRAX. All patients should ensure an adequate intake of dietary calcium (1200 mg/d) and vitamin D (800 IU daily) unless contraindicated. FOLLOW-UP: People with diagnosed cases of osteoporosis or at high risk for fracture should have regular bone mineral density tests. For patients eligible for Medicare, routine testing is allowed once every 2 years. The testing frequency can be increased to one year for patients who have rapidly progressing disease, those who are receiving or discontinuing medical therapy to restore bone mass, or have additional risk factors. I have reviewed this report, and agree with the above findings. [facility] Dear Dr. Zenia Resides, Your patient Halen Mossbarger Foronda completed a FRAX assessment on 01/24/2017 using the [place] (analysis version: 14.10) manufactured by [employer]. The following summarizes the results of our evaluation. PATIENT BIOGRAPHICAL: Name: Trystin, Hargrove Patient ID: [id number] Birth Date: 12/25/1940 Height:    64.0 in. Gender:     [age]    [age]       Weight:    153.0 lbs. Ethnicity:  Black                            Exam Date: 01/24/2017 FRAX* RESULTS:  (version: 3.5) 10-year Probability of Fracture1 Major Osteoporotic Fracture2 Hip Fracture 4.9% 0.8% Population: [place] (Black) Risk Factors: None Based on Femur (Right) Neck BMD 1 -The 10-year probability of fracture may be lower than reported if the patient has received treatment. 2 -Major  Osteoporotic Fracture: Clinical Spine, Forearm, Hip or Shoulder *FRAX is a Materials engineer of the [employer] for Metabolic Bone Disease, a [place] (WHO) [facility]. ASSESSMENT: The probability of a major osteoporotic fracture is 4.9% within the next ten years. The probability of a hip fracture is 0.8% within the next ten years. . Electronically Signed   By: Rolm Baptise M.D.   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  PLAN:  1. 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 Given the size of the tumor, no Oncotype DX was ordered and no adjuvant chemotherapy was recommended. She completed adjuvant XRT in August 2018.  She was initiated on letrozole and will take for 5 years completing in August 2023.  Patient will require repeat mammogram in March 2019.  Return to clinic in 6 months for further evaluation.    2.  Osteopenia: Bone mineral density completed on January 24, 2017 revealed a T score of -1.8.  Continue calcium and vitamin D supplementation.  Repeat in November 2019.  Approximately 20 minutes was spent in discussion of which greater than 50% was consultation.  Patient expressed understanding and was in agreement with this plan. She also understands that She can call clinic at any time with any questions, concerns, or complaints.   Cancer Staging Malignant neoplasm of upper-inner quadrant of right breast in female, estrogen receptor positive ([place]) Staging form: Breast, AJCC 8th Edition - Pathologic stage from 08/30/2016: Stage IA (pT1b, pN0, cM0, G2, ER: Positive, PR: Positive, HER2: Negative) - Signed by Lloyd Huger, MD on 08/30/2016   Lloyd Huger, MD   02/02/2017 3:40 PM

## 2017-01-31 ENCOUNTER — Inpatient Hospital Stay: Payer: PPO | Attending: Oncology | Admitting: Oncology

## 2017-01-31 VITALS — BP 164/77 | HR 77 | Temp 97.9°F | Resp 18 | Wt 154.4 lb

## 2017-01-31 DIAGNOSIS — Z170 Estrogen receptor positive status [ER+]: Secondary | ICD-10-CM | POA: Insufficient documentation

## 2017-01-31 DIAGNOSIS — Z79899 Other long term (current) drug therapy: Secondary | ICD-10-CM

## 2017-01-31 DIAGNOSIS — C50211 Malignant neoplasm of upper-inner quadrant of right female breast: Secondary | ICD-10-CM | POA: Insufficient documentation

## 2017-01-31 DIAGNOSIS — I10 Essential (primary) hypertension: Secondary | ICD-10-CM

## 2017-01-31 DIAGNOSIS — I252 Old myocardial infarction: Secondary | ICD-10-CM | POA: Diagnosis not present

## 2017-01-31 DIAGNOSIS — Z79811 Long term (current) use of aromatase inhibitors: Secondary | ICD-10-CM | POA: Insufficient documentation

## 2017-01-31 DIAGNOSIS — E785 Hyperlipidemia, unspecified: Secondary | ICD-10-CM | POA: Diagnosis not present

## 2017-01-31 DIAGNOSIS — Z880 Allergy status to penicillin: Secondary | ICD-10-CM | POA: Insufficient documentation

## 2017-01-31 DIAGNOSIS — Z923 Personal history of irradiation: Secondary | ICD-10-CM | POA: Insufficient documentation

## 2017-01-31 DIAGNOSIS — Z801 Family history of malignant neoplasm of trachea, bronchus and lung: Secondary | ICD-10-CM | POA: Diagnosis not present

## 2017-01-31 DIAGNOSIS — M8580 Other specified disorders of bone density and structure, unspecified site: Secondary | ICD-10-CM | POA: Insufficient documentation

## 2017-01-31 DIAGNOSIS — K219 Gastro-esophageal reflux disease without esophagitis: Secondary | ICD-10-CM | POA: Diagnosis not present

## 2017-01-31 DIAGNOSIS — I2510 Atherosclerotic heart disease of native coronary artery without angina pectoris: Secondary | ICD-10-CM | POA: Diagnosis not present

## 2017-01-31 DIAGNOSIS — Z87891 Personal history of nicotine dependence: Secondary | ICD-10-CM | POA: Insufficient documentation

## 2017-01-31 DIAGNOSIS — Z7982 Long term (current) use of aspirin: Secondary | ICD-10-CM | POA: Insufficient documentation

## 2017-02-02 DIAGNOSIS — I2510 Atherosclerotic heart disease of native coronary artery without angina pectoris: Secondary | ICD-10-CM | POA: Diagnosis not present

## 2017-02-02 DIAGNOSIS — I10 Essential (primary) hypertension: Secondary | ICD-10-CM | POA: Diagnosis not present

## 2017-02-02 DIAGNOSIS — I739 Peripheral vascular disease, unspecified: Secondary | ICD-10-CM | POA: Diagnosis not present

## 2017-02-02 DIAGNOSIS — I340 Nonrheumatic mitral (valve) insufficiency: Secondary | ICD-10-CM | POA: Diagnosis not present

## 2017-02-02 DIAGNOSIS — E782 Mixed hyperlipidemia: Secondary | ICD-10-CM | POA: Diagnosis not present

## 2017-02-09 ENCOUNTER — Telehealth: Payer: Self-pay | Admitting: *Deleted

## 2017-02-09 NOTE — Telephone Encounter (Signed)
Per Lorretta Harp, NP, see her tomorrow, Patient accepts appointment for 9 AM and reports this area is under her right breast

## 2017-02-09 NOTE — Telephone Encounter (Signed)
Patient called to report that the place under her breast is worse and that it looks like a burn. States she was instructed to call back if did not go away or got worse. Please advise

## 2017-02-10 ENCOUNTER — Other Ambulatory Visit: Payer: Self-pay

## 2017-02-10 ENCOUNTER — Encounter: Payer: Self-pay | Admitting: Oncology

## 2017-02-10 ENCOUNTER — Inpatient Hospital Stay: Payer: PPO | Attending: Oncology | Admitting: Oncology

## 2017-02-10 VITALS — BP 147/83 | HR 77 | Temp 96.5°F | Resp 20 | Wt 151.1 lb

## 2017-02-10 DIAGNOSIS — Z87891 Personal history of nicotine dependence: Secondary | ICD-10-CM | POA: Diagnosis not present

## 2017-02-10 DIAGNOSIS — I2510 Atherosclerotic heart disease of native coronary artery without angina pectoris: Secondary | ICD-10-CM | POA: Diagnosis not present

## 2017-02-10 DIAGNOSIS — Z79899 Other long term (current) drug therapy: Secondary | ICD-10-CM

## 2017-02-10 DIAGNOSIS — L304 Erythema intertrigo: Secondary | ICD-10-CM | POA: Diagnosis not present

## 2017-02-10 DIAGNOSIS — K219 Gastro-esophageal reflux disease without esophagitis: Secondary | ICD-10-CM

## 2017-02-10 DIAGNOSIS — C50211 Malignant neoplasm of upper-inner quadrant of right female breast: Secondary | ICD-10-CM

## 2017-02-10 DIAGNOSIS — L03319 Cellulitis of trunk, unspecified: Secondary | ICD-10-CM | POA: Diagnosis not present

## 2017-02-10 DIAGNOSIS — M8580 Other specified disorders of bone density and structure, unspecified site: Secondary | ICD-10-CM

## 2017-02-10 DIAGNOSIS — Z7982 Long term (current) use of aspirin: Secondary | ICD-10-CM | POA: Diagnosis not present

## 2017-02-10 DIAGNOSIS — I10 Essential (primary) hypertension: Secondary | ICD-10-CM | POA: Diagnosis not present

## 2017-02-10 DIAGNOSIS — Z801 Family history of malignant neoplasm of trachea, bronchus and lung: Secondary | ICD-10-CM | POA: Diagnosis not present

## 2017-02-10 DIAGNOSIS — R42 Dizziness and giddiness: Secondary | ICD-10-CM

## 2017-02-10 DIAGNOSIS — Z79811 Long term (current) use of aromatase inhibitors: Secondary | ICD-10-CM

## 2017-02-10 DIAGNOSIS — B372 Candidiasis of skin and nail: Secondary | ICD-10-CM | POA: Diagnosis not present

## 2017-02-10 DIAGNOSIS — Z170 Estrogen receptor positive status [ER+]: Secondary | ICD-10-CM

## 2017-02-10 DIAGNOSIS — Z880 Allergy status to penicillin: Secondary | ICD-10-CM

## 2017-02-10 DIAGNOSIS — E785 Hyperlipidemia, unspecified: Secondary | ICD-10-CM

## 2017-02-10 DIAGNOSIS — I252 Old myocardial infarction: Secondary | ICD-10-CM

## 2017-02-10 DIAGNOSIS — Z923 Personal history of irradiation: Secondary | ICD-10-CM

## 2017-02-10 MED ORDER — NYSTATIN 100000 UNIT/GM EX POWD: Freq: Four times a day (QID) | CUTANEOUS | 0 refills | Status: DC

## 2017-02-10 NOTE — Progress Notes (Signed)
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 Given the size of the tumor, no Oncotype DX was ordered and no adjuvant chemotherapy was recommended. She completed adjuvant XRT in August 2018.  She was initiated on letrozole and will take for 5 years completing in August 2023  Interval history- Patient was recently seen by  Dr. Grayland Ormond on 01/31/2017 for a 3 month evaluation. She continued to tolerate letrozole well without side effects. She denied any neurological complaints, fevers or illnesses. She maintained a good appetite and denied weight loss.  Patient presents today for worsening pain under her right breast. Patient states that at last visit with Dr. Grayland Ormond she noted to have mild irritation under right breast. Dr. Grayland Ormond stated to let him know if this got worse. Patient states that over the course of the past several days the skin has become raw and she is unable  to wear a bra. She has been using Aquaphor lotion several times per day without relief. She denies fevers or diarrhea. She does admit to one remote dizzy spell when she stood quickly changing positions.  Otherwise she has been feeling well.  ECOG FS:0 - Asymptomatic  Review of systems- Review of Systems  Constitutional: Negative for chills, fever, malaise/fatigue and weight loss.  HENT: Negative.   Eyes: Negative.   Respiratory: Negative.   Cardiovascular: Negative.   Gastrointestinal: Negative.   Genitourinary: Negative.   Musculoskeletal: Negative.   Skin: Positive for rash.  Neurological: Positive for dizziness. Negative for weakness.       One remote dizzy spell last Saturday when changing positons.  Endo/Heme/Allergies: Negative.   Psychiatric/Behavioral: Negative.      Current treatment- Letrozole  Allergies  Allergen Reactions  . Celebrex  [Celecoxib]     Dark stools.  . Penicillins Other (See Comments)    Childhood reaction Has patient had a PCN reaction causing immediate rash, facial/tongue/throat swelling, SOB or lightheadedness with hypotension: Unknown Has patient had a PCN reaction causing severe rash involving mucus membranes or skin necrosis: Unknown Has patient had a PCN reaction that required hospitalization: Unknown Has patient had a PCN reaction occurring within the last 10 years: Unknown If all of the above answers are "NO", then may proceed with Cephalosporin use.   Marland Kitchen Prevacid  [Lansoprazole] Diarrhea     Past Medical History:  Diagnosis Date  . Cancer ([place]) 07/23/2016   T1b, N0; ER/PR+, 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 Mucin noted on biopsy, not on wide excision.  . Coronary artery disease   . GERD (gastroesophageal reflux disease)   .  Hyperlipidemia   . Hypertension   . MI (myocardial infarction) ([place])    2015     Past Surgical History:  Procedure Laterality Date  . ABDOMINAL HYSTERECTOMY    . APPENDECTOMY    . BREAST BIOPSY  Right 07/23/2016   INVASIVE MAMMARY CARCINOMA WITH AREAS OF EXTRACELLULAR MUCIN  . COLONOSCOPY  2016  . CORONARY ANGIOPLASTY WITH STENT PLACEMENT    . CYSTOSCOPY    . PARTIAL MASTECTOMY WITH AXILLARY SENTINEL LYMPH NODE BIOPSY Right 08/09/2016   Procedure: PARTIAL MASTECTOMY WITH AXILLARY SENTINEL LYMPH NODE BIOPSY;  Surgeon: Robert Bellow, MD;  Location: [facility] ORS;  Service: General;  Laterality: Right;  . TONSILLECTOMY      Social History   Socioeconomic History  . Marital status: Married    Spouse name: [name]  . Number of children: 2  . Years of education: Not on file  . Highest education level: Not on file  Social Needs  . Financial resource strain: Not on file  . Food insecurity - worry: Not on file  . Food insecurity - inability: Not on file  . Transportation needs - medical: Not on file  . Transportation needs - non-medical: Not on file  Occupational History  . Occupation: retired  Tobacco Use  . Smoking status: Former Smoker    Packs/day: 0.25    Years: 10.00    Pack years: 2.50    Types: Cigarettes    Last attempt to quit: 03/06/1998    Years since quitting: 18.9  . Smokeless tobacco: Never Used  Substance and Sexual Activity  . Alcohol use: No  . Drug use: No  . Sexual activity: Yes    Birth control/protection: Surgical  Other Topics Concern  . Not on file  Social History Narrative  . Not on file    Family History  Problem Relation Age of Onset  . Hyperlipidemia Mother   . Allergies Mother   . Cerebral aneurysm Mother        cause of death at age 9  . Heart disease Father        Fatal MI ag 59  . Cancer Brother        lung cancer  . Hyperlipidemia Brother   . Colonic polyp Brother   . Healthy Son   . Cancer - Lung Brother        colon  . Healthy Son      Current Outpatient Medications:  .  amLODipine (NORVASC) 5 MG tablet, TAKE 1 TABLET BY MOUTH EVERY DAY, Disp: 30 tablet, Rfl: 12 .  aspirin EC 81 MG tablet, Take 81 mg by mouth daily.,  Disp: , Rfl:  .  Ferrous Sulfate (IRON) 28 MG TABS, Take 28 mg by mouth daily., Disp: , Rfl:  .  fluocinonide cream (LIDEX) 4.80 %, Apply 1 application topically daily as needed (rash)., Disp: , Rfl:  .  letrozole (FEMARA) 2.5 MG tablet, Take 1 tablet (2.5 mg total) by mouth daily., Disp: 90 tablet, Rfl: 3 .  lisinopril (PRINIVIL,ZESTRIL) 20 MG tablet, TAKE 1 TABLET BY MOUTH EVERY DAY, Disp: , Rfl:  .  omeprazole (PRILOSEC) 20 MG capsule, Take 20 mg by mouth daily as needed (indigestion)., Disp: , Rfl:  .  nystatin (NYSTATIN) powder, Apply topically 4 (four) times daily. Apply under right breast four times a day until symptoms resolve., Disp: 15 g, Rfl: 0  Physical exam:  Vitals:   02/10/17 0910  BP: (!) 147/83  Pulse: 77  Resp: 20  Temp: (!) 96.5 F (35.8 C)  TempSrc: Tympanic  Weight: 151 lb 1.6 oz (68.5 kg)   Physical Exam  Constitutional: She is oriented to person, place, and time and well-developed, well-nourished, and in no distress. Vital signs are normal.  HENT:  Head: Normocephalic and atraumatic.  Eyes: Pupils are equal, round, and reactive to light.  Neck: Normal range of motion. Neck supple.  Cardiovascular: Normal rate and regular rhythm.  Pulmonary/Chest: Effort normal and breath sounds normal.  Abdominal: Soft. Bowel sounds are normal.  Musculoskeletal: Normal range of motion.  Neurological: She is alert and oriented to person, place, and time.  Skin: Skin is warm. Rash noted. There is erythema.        CMP Latest Ref Rng & Units 07/29/2016  Glucose 65 - 99 mg/dL 84  BUN 8 - 27 mg/dL 18  Creatinine 0.57 - 1.00 mg/dL 0.88  Sodium 134 - 144 mmol/L 145(H)  Potassium 3.5 - 5.2 mmol/L 4.4  Chloride 96 - 106 mmol/L 105  CO2 18 - 29 mmol/L 25  Calcium 8.7 - 10.3 mg/dL 9.3  Total Protein 6.0 - 8.5 g/dL 6.4  Total Bilirubin 0.0 - 1.2 mg/dL 0.7  Alkaline Phos 39 - 117 IU/L 68  AST 0 - 40 IU/L 13  ALT 0 - 32 IU/L 9   CBC Latest Ref Rng & Units 10/06/2016  WBC 3.6 -  11.0 K/uL 5.0  Hemoglobin 12.0 - 16.0 g/dL 13.0  Hematocrit 35.0 - 47.0 % 38.2  Platelets 150 - 440 K/uL 185    No images are attached to the encounter.  Dg Bone Density  Result Date: 01/24/2017 EXAM: DUAL X-RAY ABSORPTIOMETRY (DXA) FOR BONE MINERAL DENSITY IMPRESSION: Dear Dr. Zenia Resides, Your patient Bernadean Tagle completed a BMD test on 01/24/2017 using the [place] (analysis version: 14.10) manufactured by [employer]. The following summarizes the results of our evaluation. PATIENT BIOGRAPHICAL: Name: Tera, Pellicane Patient ID: [id number] Birth Date: [birth year] Height: 64.0 in. Gender: Female Exam Date: 01/24/2017 Weight: 153.0 lbs. Indications: Advanced Age, High Risk Meds, History of Breast Cancer, Hysterectomy, Oophorectomy Bilateral, Postmenopausal Fractures: Treatments: Letrozole, Omeprazole ASSESSMENT: The BMD measured at Forearm Radius 33% is 0.721 g/cm2 with a T-score of -1.8. This patient is considered osteopenic according to [facility]) criteria. Lumbar spine was not utilized due to advanced degenerative changes/scoliosis. Site Region Measured Measured WHO Young Adult BMD Date       Age      Classification T-score DualFemur Total Right 01/24/2017 78.1 Normal -1.0 0.879 g/cm2 Left Forearm Radius 33% 01/24/2017 78.1 Osteopenia -1.8 0.721 g/cm2 World Health Organization [facility]) criteria for post-menopausal, Caucasian Women: Normal:       T-score at or above -1 SD Osteopenia:   T-score between -1 and -2.5 SD Osteoporosis: T-score at or below -2.5 SD RECOMMENDATIONS: [place] recommends that FDA-approved medical therapies be considered in postmenopausal women and men age 73 or older with a: 1. Hip or vertebral (clinical or morphometric) fracture. 2. T-score of < -2.5 at the spine or hip. 3. Ten-year fracture probability by FRAX of 3% or greater for hip fracture or 20% or greater for major osteoporotic fracture. All treatment decisions  require clinical judgment and consideration of individual patient factors, including patient preferences, co-morbidities, previous drug use, risk factors not captured in the FRAX model (e.g. falls, vitamin D deficiency, increased bone turnover, interval significant decline in bone density) and possible under - or over-estimation of fracture risk by FRAX. All patients  should ensure an adequate intake of dietary calcium (1200 mg/d) and vitamin D (800 IU daily) unless contraindicated. FOLLOW-UP: People with diagnosed cases of osteoporosis or at high risk for fracture should have regular bone mineral density tests. For patients eligible for Medicare, routine testing is allowed once every 2 years. The testing frequency can be increased to one year for patients who have rapidly progressing disease, those who are receiving or discontinuing medical therapy to restore bone mass, or have additional risk factors. I have reviewed this report, and agree with the above findings. [facility] Radiology Dear Dr. Zenia Resides, Your patient Shavaughn Seidl Cieslinski completed a FRAX assessment on 01/24/2017 using the [place] (analysis version: 14.10) manufactured by [employer]. The following summarizes the results of our evaluation. PATIENT BIOGRAPHICAL: Name: Lessie, Funderburke Patient ID: [id number] Birth Date: 08-12-1940 Height:    64.0 in. Gender:     [age]       Weight:    153.0 lbs. Ethnicity:  Black                            Exam Date: 01/24/2017 FRAX* RESULTS:  (version: 3.5) 10-year Probability of Fracture1 Major Osteoporotic Fracture2 Hip Fracture 4.9% 0.8% Population: [place] (Black) Risk Factors: None Based on Femur (Right) Neck BMD 1 -The 10-year probability of fracture may be lower than reported if the patient has received treatment. 2 -Major Osteoporotic Fracture: Clinical Spine, Forearm, Hip or Shoulder *FRAX is a Materials engineer of the [employer] for Metabolic Bone  Disease, a [place] (WHO) [facility]. ASSESSMENT: The probability of a major osteoporotic fracture is 4.9% within the next ten years. The probability of a hip fracture is 0.8% within the next ten years. . Electronically Signed   By: Rolm Baptise M.D.   On: 01/24/2017 09:35     Assessment and plan- Patient is a 76 y.o. female she presents for candidiasis under right breast fold. Candidiasis is present along with several open fissures and erythema in right breast fold. Area is warm to the touch.   1. Candidal intertrigo: RX Nystatin powder. Explained to patient that she needed to keep area as dry and apply powder several times per day. Patient may need Fluconzole 100 mg daily for 7 days if resistant. Patient to call if this worsens.  2.  Return to clinic as scheduled or as needed if symptoms do not improve.  Visit Diagnosis 1. Candidal intertrigo     Patient expressed understanding and was in agreement with this plan. She also understands that She can call clinic at any time with any questions, concerns, or complaints.   Greater than 50% was spent in counseling and coordination of care with this patient including but not limited to discussion of the relevant topics above (See A&P) including, but not limited to diagnosis and management of acute and chronic medical conditions.    Faythe Casa, AGNP-C [facility]- [phone] Pager- [phone] 02/10/2017 3:52 PM

## 2017-02-10 NOTE — Progress Notes (Signed)
Patient here today as acute add on for right breast irritation and discomfort. Patient reports skin is broken and feels like a burn under her right breast. Patient recently completed radiation, feels like skin under breast has worsened over the last week.

## 2017-02-15 ENCOUNTER — Other Ambulatory Visit: Payer: Self-pay | Admitting: Oncology

## 2017-02-15 MED ORDER — FLUCONAZOLE 100 MG PO TABS: 100.0000 mg | ORAL_TABLET | Freq: Every day | ORAL | 7 refills | Status: DC

## 2017-02-18 ENCOUNTER — Inpatient Hospital Stay (HOSPITAL_BASED_OUTPATIENT_CLINIC_OR_DEPARTMENT_OTHER): Payer: PPO | Admitting: Oncology

## 2017-02-18 VITALS — BP 126/75 | HR 91 | Temp 96.1°F

## 2017-02-18 DIAGNOSIS — I10 Essential (primary) hypertension: Secondary | ICD-10-CM | POA: Diagnosis not present

## 2017-02-18 DIAGNOSIS — Z170 Estrogen receptor positive status [ER+]: Secondary | ICD-10-CM | POA: Diagnosis not present

## 2017-02-18 DIAGNOSIS — K219 Gastro-esophageal reflux disease without esophagitis: Secondary | ICD-10-CM | POA: Diagnosis not present

## 2017-02-18 DIAGNOSIS — C50211 Malignant neoplasm of upper-inner quadrant of right female breast: Secondary | ICD-10-CM | POA: Diagnosis not present

## 2017-02-18 DIAGNOSIS — Z923 Personal history of irradiation: Secondary | ICD-10-CM | POA: Diagnosis not present

## 2017-02-18 DIAGNOSIS — I2510 Atherosclerotic heart disease of native coronary artery without angina pectoris: Secondary | ICD-10-CM

## 2017-02-18 DIAGNOSIS — M8580 Other specified disorders of bone density and structure, unspecified site: Secondary | ICD-10-CM

## 2017-02-18 DIAGNOSIS — Z79899 Other long term (current) drug therapy: Secondary | ICD-10-CM

## 2017-02-18 DIAGNOSIS — L304 Erythema intertrigo: Secondary | ICD-10-CM | POA: Diagnosis not present

## 2017-02-18 DIAGNOSIS — Z87891 Personal history of nicotine dependence: Secondary | ICD-10-CM

## 2017-02-18 DIAGNOSIS — L03319 Cellulitis of trunk, unspecified: Secondary | ICD-10-CM

## 2017-02-18 DIAGNOSIS — Z7982 Long term (current) use of aspirin: Secondary | ICD-10-CM

## 2017-02-18 DIAGNOSIS — L03818 Cellulitis of other sites: Secondary | ICD-10-CM

## 2017-02-18 DIAGNOSIS — I252 Old myocardial infarction: Secondary | ICD-10-CM

## 2017-02-18 DIAGNOSIS — E785 Hyperlipidemia, unspecified: Secondary | ICD-10-CM

## 2017-02-18 DIAGNOSIS — Z801 Family history of malignant neoplasm of trachea, bronchus and lung: Secondary | ICD-10-CM

## 2017-02-18 DIAGNOSIS — Z79811 Long term (current) use of aromatase inhibitors: Secondary | ICD-10-CM

## 2017-02-18 DIAGNOSIS — Z880 Allergy status to penicillin: Secondary | ICD-10-CM

## 2017-02-18 MED ORDER — DOXYCYCLINE HYCLATE 100 MG PO TABS: 100.0000 mg | ORAL_TABLET | Freq: Two times a day (BID) | ORAL | 0 refills | Status: DC

## 2017-02-18 MED ORDER — NYSTATIN-TRIAMCINOLONE 100000-0.1 UNIT/GM-% EX OINT: 1.0000 "application " | TOPICAL_OINTMENT | Freq: Two times a day (BID) | CUTANEOUS | 0 refills | Status: DC

## 2017-02-18 MED ORDER — NYSTATIN 100000 UNIT/GM EX POWD: Freq: Four times a day (QID) | CUTANEOUS | 0 refills | Status: DC

## 2017-02-18 NOTE — Progress Notes (Signed)
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 Given the size of the tumor, no Oncotype DX was ordered and no adjuvant chemotherapywas recommended.She completed adjuvant XRT in August 2018. She was initiated on letrozole and will take for 5 years completing in August 2023  Interval history- Patient was last seen in symptom management on 02/10/2017 for breast pain. She tried Aquaphor lotion several times per day without relief. She was given nystatin powder and instructed to keep area dry and clean. Patient called back to clinic several times noting that the area was not getting any better. Diflucan tablets 100 mg for 7 days was prescribed. Again it was reiterated for the patient to keep area very dry and use only the nystatin powder.  Today patient presents with worsening right breast fold pain and redness. Patient states she has been doing as instructed  and keeping the area dry and has her house stent help her with the nystatin powder under her breast. She continues to be unable to wear her bra and is only comfortable when lying down. She denies fevers. She states the pain has become worse and is now a 5 or 6 out of 10.   ECOG FS:0 - Asymptomatic  Review of systems- Review of Systems  Constitutional: Negative for chills, fever and malaise/fatigue.  HENT: Negative.   Eyes: Negative.   Respiratory: Negative.   Cardiovascular: Negative.   Gastrointestinal: Negative.  Negative for heartburn, nausea and vomiting.  Musculoskeletal: Negative.   Skin: Positive for rash. Negative for itching.  Neurological: Negative.  Negative for dizziness and weakness.  Endo/Heme/Allergies: Negative.   Psychiatric/Behavioral: Negative.      Current treatment- Letrozole  Allergies  Allergen Reactions  . Celebrex  [Celecoxib]     Dark stools.  . Penicillins Other (See Comments)    Childhood reaction Has patient had a PCN reaction causing immediate rash, facial/tongue/throat swelling, SOB or lightheadedness with hypotension: Unknown Has patient had a PCN reaction causing severe rash involving mucus membranes or skin necrosis: Unknown Has patient had a PCN reaction that required hospitalization: Unknown Has patient had a PCN reaction occurring within the last 10 years: Unknown If all of the above answers are "NO", then may proceed with Cephalosporin use.   Marland Kitchen Prevacid  [Lansoprazole] Diarrhea     Past Medical History:  Diagnosis Date  . Cancer ([place]) 07/23/2016   T1b, N0; ER/PR+, 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 Mucin noted on biopsy, not on wide excision.  . Coronary artery disease   . GERD (gastroesophageal reflux disease)   .  Hyperlipidemia   . Hypertension   . MI (myocardial infarction) ([place])    2015     Past Surgical History:  Procedure Laterality Date  . ABDOMINAL HYSTERECTOMY    . APPENDECTOMY    . BREAST BIOPSY Right  07/23/2016   INVASIVE MAMMARY CARCINOMA WITH AREAS OF EXTRACELLULAR MUCIN  . COLONOSCOPY  2016  . CORONARY ANGIOPLASTY WITH STENT PLACEMENT    . CYSTOSCOPY    . PARTIAL MASTECTOMY WITH AXILLARY SENTINEL LYMPH NODE BIOPSY Right 08/09/2016   Procedure: PARTIAL MASTECTOMY WITH AXILLARY SENTINEL LYMPH NODE BIOPSY;  Surgeon: Robert Bellow, MD;  Location: [facility] ORS;  Service: General;  Laterality: Right;  . TONSILLECTOMY      Social History   Socioeconomic History  . Marital status: Married    Spouse name: [name]  . Number of children: 2  . Years of education: Not on file  . Highest education level: Not on file  Social Needs  . Financial resource strain: Not on file  . Food insecurity - worry: Not on file  . Food insecurity - inability: Not on file  . Transportation needs - medical: Not on file  . Transportation needs - non-medical: Not on file  Occupational History  . Occupation: retired  Tobacco Use  . Smoking status: Former Smoker    Packs/day: 0.25    Years: 10.00    Pack years: 2.50    Types: Cigarettes    Last attempt to quit: 03/06/1998    Years since quitting: 18.9  . Smokeless tobacco: Never Used  Substance and Sexual Activity  . Alcohol use: No  . Drug use: No  . Sexual activity: Yes    Birth control/protection: Surgical  Other Topics Concern  . Not on file  Social History Narrative  . Not on file    Family History  Problem Relation Age of Onset  . Hyperlipidemia Mother   . Allergies Mother   . Cerebral aneurysm Mother        cause of death at age 60  . Heart disease Father        Fatal MI ag 78  . Cancer Brother        lung cancer  . Hyperlipidemia Brother   . Colonic polyp Brother   . Healthy Son   . Cancer - Lung Brother        colon  . Healthy Son      Current Outpatient Medications:  .  amLODipine (NORVASC) 5 MG tablet, TAKE 1 TABLET BY MOUTH EVERY DAY, Disp: 30 tablet, Rfl: 12 .  aspirin EC 81 MG tablet, Take 81 mg by mouth daily., Disp: ,  Rfl:  .  doxycycline (VIBRA-TABS) 100 MG tablet, Take 1 tablet (100 mg total) by mouth 2 (two) times daily., Disp: 20 tablet, Rfl: 0 .  Ferrous Sulfate (IRON) 28 MG TABS, Take 28 mg by mouth daily., Disp: , Rfl:  .  fluconazole (DIFLUCAN) 100 MG tablet, Take 1 tablet (100 mg total) by mouth daily., Disp: 100 tablet, Rfl: 7 .  fluocinonide cream (LIDEX) 0.35 %, Apply 1 application topically daily as needed (rash)., Disp: , Rfl:  .  letrozole (FEMARA) 2.5 MG tablet, Take 1 tablet (2.5 mg total) by mouth daily., Disp: 90 tablet, Rfl: 3 .  lisinopril (PRINIVIL,ZESTRIL) 20 MG tablet, TAKE 1 TABLET BY MOUTH EVERY DAY, Disp: , Rfl:  .  nystatin (NYSTATIN) powder, Apply topically 4 (four) times daily. Apply under right breast four times a day until symptoms resolve.,  Disp: 15 g, Rfl: 0 .  nystatin-triamcinolone ointment (MYCOLOG), Apply 1 application topically 2 (two) times daily., Disp: 30 g, Rfl: 0 .  omeprazole (PRILOSEC) 20 MG capsule, Take 20 mg by mouth daily as needed (indigestion)., Disp: , Rfl:   Physical exam:  Vitals:   02/18/17 1027  BP: 126/75  Pulse: 91  Temp: (!) 96.1 F (35.6 C)  SpO2: 99%   Physical Exam  Constitutional: She is oriented to person, place, and time and well-developed, well-nourished, and in no distress.  HENT:  Head: Normocephalic and atraumatic.  Eyes: Pupils are equal, round, and reactive to light.  Neck: Normal range of motion. Neck supple.  Cardiovascular: Normal rate and regular rhythm.  Pulmonary/Chest: Effort normal and breath sounds normal.  Abdominal: Soft. Bowel sounds are normal.  Musculoskeletal: Normal range of motion.  Neurological: She is alert and oriented to person, place, and time.  Skin: Rash noted. Rash is vesicular. There is erythema.        CMP Latest Ref Rng & Units 07/29/2016  Glucose 65 - 99 mg/dL 84  BUN 8 - 27 mg/dL 18  Creatinine 0.57 - 1.00 mg/dL 0.88  Sodium 134 - 144 mmol/L 145(H)  Potassium 3.5 - 5.2 mmol/L 4.4  Chloride  96 - 106 mmol/L 105  CO2 18 - 29 mmol/L 25  Calcium 8.7 - 10.3 mg/dL 9.3  Total Protein 6.0 - 8.5 g/dL 6.4  Total Bilirubin 0.0 - 1.2 mg/dL 0.7  Alkaline Phos 39 - 117 IU/L 68  AST 0 - 40 IU/L 13  ALT 0 - 32 IU/L 9   CBC Latest Ref Rng & Units 10/06/2016  WBC 3.6 - 11.0 K/uL 5.0  Hemoglobin 12.0 - 16.0 g/dL 13.0  Hematocrit 35.0 - 47.0 % 38.2  Platelets 150 - 440 K/uL 185    No images are attached to the encounter.  Dg Bone Density  Result Date: 01/24/2017 EXAM: DUAL X-RAY ABSORPTIOMETRY (DXA) FOR BONE MINERAL DENSITY IMPRESSION: Dear Dr. Zenia Resides, Your patient Shevette Bellamy completed a BMD test on 01/24/2017 using the [place] (analysis version: 14.10) manufactured by [employer]. The following summarizes the results of our evaluation. PATIENT BIOGRAPHICAL: Name: Brittanni, Cariker Patient ID: [id number] Birth Date: [birth year] Height: 64.0 in. Gender: Female Exam Date: 01/24/2017 Weight: 153.0 lbs. Indications: Advanced Age, High Risk Meds, History of Breast Cancer, Hysterectomy, Oophorectomy Bilateral, Postmenopausal Fractures: Treatments: Letrozole, Omeprazole ASSESSMENT: The BMD measured at Forearm Radius 33% is 0.721 g/cm2 with a T-score of -1.8. This patient is considered osteopenic according to [facility]) criteria. Lumbar spine was not utilized due to advanced degenerative changes/scoliosis. Site Region Measured Measured WHO Young Adult BMD Date       Age      Classification T-score DualFemur Total Right 01/24/2017 78.1 Normal -1.0 0.879 g/cm2 Left Forearm Radius 33% 01/24/2017 78.1 Osteopenia -1.8 0.721 g/cm2 World Health Organization [facility]) criteria for post-menopausal, Caucasian Women: Normal:       T-score at or above -1 SD Osteopenia:   T-score between -1 and -2.5 SD Osteoporosis: T-score at or below -2.5 SD RECOMMENDATIONS: [place] recommends that FDA-approved medical therapies be considered in postmenopausal women and men age  4 or older with a: 1. Hip or vertebral (clinical or morphometric) fracture. 2. T-score of < -2.5 at the spine or hip. 3. Ten-year fracture probability by FRAX of 3% or greater for hip fracture or 20% or greater for major osteoporotic fracture. All treatment decisions require clinical judgment and  consideration of individual patient factors, including patient preferences, co-morbidities, previous drug use, risk factors not captured in the FRAX model (e.g. falls, vitamin D deficiency, increased bone turnover, interval significant decline in bone density) and possible under - or over-estimation of fracture risk by FRAX. All patients should ensure an adequate intake of dietary calcium (1200 mg/d) and vitamin D (800 IU daily) unless contraindicated. FOLLOW-UP: People with diagnosed cases of osteoporosis or at high risk for fracture should have regular bone mineral density tests. For patients eligible for Medicare, routine testing is allowed once every 2 years. The testing frequency can be increased to one year for patients who have rapidly progressing disease, those who are receiving or discontinuing medical therapy to restore bone mass, or have additional risk factors. I have reviewed this report, and agree with the above findings. [facility]iology Dear Dr. Zenia Resides, Your patient Nilah Belcourt Ingle completed a FRAX assessment on 01/24/2017 using the [place] (analysis version: 14.10) manufactured by [employer]. The following summarizes the results of our evaluation. PATIENT BIOGRAPHICAL: Name: Isaly, Fasching Patient ID: [id number] Birth Date: 13-Sep-1940 Height:    64.0 in. Gender:     Female    [age]       Weight:    153.0 lbs. Ethnicity:  Black                            Exam Date: 01/24/2017 FRAX* RESULTS:  (version: 3.5) 10-year Probability of Fracture1 Major Osteoporotic Fracture2 Hip Fracture 4.9% 0.8% Population: [place] (Black) Risk Factors: None Based on Femur (Right) Neck BMD 1 -The  10-year probability of fracture may be lower than reported if the patient has received treatment. 2 -Major Osteoporotic Fracture: Clinical Spine, Forearm, Hip or Shoulder *FRAX is a Materials engineer of the [employer] for Metabolic Bone Disease, a [place] (WHO) [facility]. ASSESSMENT: The probability of a major osteoporotic fracture is 4.9% within the next ten years. The probability of a hip fracture is 0.8% within the next ten years. . Electronically Signed   By: Rolm Baptise M.D.   On: 01/24/2017 09:35     Assessment and plan- Patient is a 76 y.o. female who presents for worsening right breast fold intertrigo dermatitis. Area is much worse than last week when examined. Area appears to be excoriated and extends further down torso below breast. Candidiasis and odor is noted on examination. Several open areas noted exposing dermis with bleeding. Patient is afebrile.  1. Candidal intertrigo: Given the significant worsening of dermatitis and pain experienced by the patient will give her RX Doxycycline 100 mg BID. Refilled Nystatin powder. Patient also given a prescription for nystatin cream. After lengthy discussion, patient continued to apply Aquaphor lotion under breast even after told not to. Explained to patient that the area needed to remain dry. Supplied patient with inter-dry dressings to help wick moisture. Patient instructed that she may wash these and reuse. Will touch base with patient on Monday. Cleaned and personally shown how to care for skin. 2. RTC to see Dr. Grayland Ormond as scheduled.    Visit Diagnosis 1. Intertriginous dermatitis associated with moisture   2. Cellulitis of other specified site     Patient expressed understanding and was in agreement with this plan. She also understands that She can call clinic at any time with any questions, concerns, or complaints.   Greater than  50% was spent in counseling and coordination of care  with this patient including but not limited to discussion of the relevant topics above (See A&P) including, but not limited to diagnosis and management of acute and chronic medical conditions.    Faythe Casa, AGNP-C [facility]- [phone] Pager- [phone] 02/21/2017 11:27 AM

## 2017-03-14 ENCOUNTER — Ambulatory Visit: Payer: Self-pay | Admitting: Family Medicine

## 2017-03-17 ENCOUNTER — Ambulatory Visit (INDEPENDENT_AMBULATORY_CARE_PROVIDER_SITE_OTHER): Payer: PPO | Admitting: Family Medicine

## 2017-03-17 VITALS — BP 114/62 | HR 84 | Temp 97.6°F | Resp 16 | Wt 151.0 lb

## 2017-03-17 DIAGNOSIS — E119 Type 2 diabetes mellitus without complications: Secondary | ICD-10-CM

## 2017-03-17 DIAGNOSIS — I10 Essential (primary) hypertension: Secondary | ICD-10-CM | POA: Diagnosis not present

## 2017-03-17 DIAGNOSIS — E7800 Pure hypercholesterolemia, unspecified: Secondary | ICD-10-CM | POA: Diagnosis not present

## 2017-03-17 DIAGNOSIS — I2510 Atherosclerotic heart disease of native coronary artery without angina pectoris: Secondary | ICD-10-CM | POA: Diagnosis not present

## 2017-03-17 NOTE — Patient Instructions (Signed)
Stop Amlodipine and see if dizziness gets better.

## 2017-03-17 NOTE — Progress Notes (Signed)
[name] [name]  MRN: [id number] DOB: 27-Jun-1940  Subjective:  HPI   The patient is a 77 year old female who presents for follow up of her diabetes.  She was last seen on 11/22/16, her last A1C was done on 06/30/16 and it was 5.5.  She does not check her glucose at home.   Patient has been placed back on Pravastatin by her cardiologist and needs to have this checked today.   Patient also complains of some dizziness.  She asked if we could check if she was anemic because she had these similar symptoms when she was in the past.    Patient Active Problem List   Diagnosis Date Noted  . Malignant neoplasm of upper-inner quadrant of right breast in female, estrogen receptor positive ([place]) 07/28/2016  . Gastric ulcer requiring drug therapy, chronic 01/23/2016  . MI (mitral incompetence) 07/22/2015  . Combined fat and carbohydrate induced hyperlipemia 12/26/2014  . UTI (lower urinary tract infection) 08/18/2014  . Blurry vision 08/18/2014  . Allergic rhinitis 07/12/2014  . Absolute anemia 07/12/2014  . Baker's cyst of knee 07/12/2014  . Atherosclerosis of coronary artery 07/12/2014  . CAFL (chronic airflow limitation) ([place]) 07/12/2014  . Dizziness 07/12/2014  . Essential (primary) hypertension 07/12/2014  . Acid reflux 07/12/2014  . 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 History of colon polyps 07/12/2014  . Hypercholesteremia 07/12/2014  . Malaise and fatigue 07/12/2014  . Heart attack ([place]) 07/12/2014  . Muscle ache 07/12/2014  . Arthritis, degenerative 07/12/2014  . Peripheral blood vessel disorder ([place]) 07/12/2014  . Breath shortness 07/12/2014  . Bradycardia 08/23/2013  . Arteriosclerosis of coronary artery 08/17/2013  . Diabetes ([place]) 08/17/2013  . Diabetes mellitus ([place]) 08/17/2013  . Peripheral vascular disease ([place]) 08/17/2013    Past Medical History:  Diagnosis Date  . Cancer ([place]) 07/23/2016   T1b, N0; ER/PR+, 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 Mucin noted  on biopsy, not on wide excision.  . Coronary artery disease   . GERD (gastroesophageal reflux disease)   . Hyperlipidemia   . Hypertension   . MI (myocardial infarction) ([place])    2015    Social History   Socioeconomic History  . Marital status: Married    Spouse name: [name]  . Number of children: 2  . Years of education: Not on file  . Highest education level: Not on file  Social Needs  . Financial resource strain: Not on file  . Food insecurity - worry: Not on file  . Food insecurity - inability: Not on file  . Transportation needs - medical: Not on file  . Transportation needs - non-medical: Not on file  Occupational History  . Occupation: retired  Tobacco Use  . Smoking status: Former Smoker    Packs/day: 0.25    Years: 10.00    Pack years: 2.50    Types: Cigarettes    Last attempt to quit: 03/06/1998    Years since quitting: 19.0  . Smokeless tobacco: Never Used  Substance and Sexual Activity  . Alcohol use: No  . Drug use: No  . Sexual activity: Yes    Birth control/protection: Surgical  Other Topics Concern  . Not on file  Social History Narrative  . Not on file    Outpatient Encounter Medications as of 03/17/2017  Medication Sig Note  . amLODipine (NORVASC) 5 MG tablet TAKE 1 TABLET BY MOUTH EVERY DAY   . aspirin EC 81 MG tablet Take 81 mg by mouth daily. 08/18/2014:  Received from: [place]: Take 81 mg by mouth.  . Ferrous Sulfate (IRON) 28 MG TABS Take 28 mg by mouth daily.   . fluocinonide cream (LIDEX) 4.19 % Apply 1 application topically daily as needed (rash).   Marland Kitchen letrozole (FEMARA) 2.5 MG tablet Take 1 tablet (2.5 mg total) by mouth daily.   Marland Kitchen lisinopril (PRINIVIL,ZESTRIL) 20 MG tablet TAKE 1 TABLET BY MOUTH EVERY DAY   . nystatin (NYSTATIN) powder Apply topically 4 (four) times daily. Apply under right breast four times a day until symptoms resolve.   . nystatin-triamcinolone ointment (MYCOLOG) Apply 1 application topically 2 (two)  times daily.   Marland Kitchen omeprazole (PRILOSEC) 20 MG capsule Take 20 mg by mouth daily as needed (indigestion).   . pravastatin (PRAVACHOL) 20 MG tablet Take 20 mg by mouth daily.   . [DISCONTINUED] doxycycline (VIBRA-TABS) 100 MG tablet Take 1 tablet (100 mg total) by mouth 2 (two) times daily.   . [DISCONTINUED] fluconazole (DIFLUCAN) 100 MG tablet Take 1 tablet (100 mg total) by mouth daily.    No facility-administered encounter medications on file as of 03/17/2017.     Allergies  Allergen Reactions  . Celebrex  [Celecoxib]     Dark stools.  . Penicillins Other (See Comments)    Childhood reaction Has patient had a PCN reaction causing immediate rash, facial/tongue/throat swelling, SOB or lightheadedness with hypotension: Unknown Has patient had a PCN reaction causing severe rash involving mucus membranes or skin necrosis: Unknown Has patient had a PCN reaction that required hospitalization: Unknown Has patient had a PCN reaction occurring within the last 10 years: Unknown If all of the above answers are "NO", then may proceed with Cephalosporin use.   Marland Kitchen Prevacid  [Lansoprazole] Diarrhea    Review of Systems  Constitutional: Negative for fever and malaise/fatigue.  Respiratory: Negative for cough, shortness of breath and wheezing.   Cardiovascular: Negative for chest pain, palpitations, orthopnea, claudication and leg swelling.  Genitourinary: Negative for frequency.  Musculoskeletal: Positive for joint pain (And swelling in the knees and shoulders.).  Neurological: Positive for dizziness. Negative for weakness.  Endo/Heme/Allergies: Negative for polydipsia.    Objective:  BP 114/62 (BP Location: Right Arm, Patient Position: Sitting, Cuff Size: Normal)   Pulse 84   Temp 97.6 F (36.4 C) (Oral)   Resp 16   Wt 151 lb (68.5 kg)   BMI 25.92 kg/m   Physical Exam  Constitutional: She is well-developed, well-nourished, and in no distress.  HENT:  Head: Normocephalic and atraumatic.    Eyes: Pupils are equal, round, and reactive to light.  No nystagmus   Neck: Normal range of motion.  Cardiovascular: Normal rate, regular rhythm and normal heart sounds.  Pulmonary/Chest: Effort normal and breath sounds normal.    Assessment and Plan :  1. Type 2 diabetes mellitus without complication, unspecified whether long term insulin use (HCC)  - Hemoglobin A1c - Comprehensive metabolic panel  2. Essential (primary) hypertension  - CBC with Differential/Platelet - TSH - Comprehensive metabolic panel  3. Hypercholesteremia  - Lipid Panel With LDL/HDL Ratio 4.Breast Cancer  I have done the exam and reviewed the chart and it is accurate to the best of my knowledge. Development worker, community has been used and  any errors in dictation or transcription are unintentional. Miguel Aschoff M.D. [facility]

## 2017-03-18 DIAGNOSIS — E7800 Pure hypercholesterolemia, unspecified: Secondary | ICD-10-CM | POA: Diagnosis not present

## 2017-03-18 DIAGNOSIS — I10 Essential (primary) hypertension: Secondary | ICD-10-CM | POA: Diagnosis not present

## 2017-03-18 DIAGNOSIS — E119 Type 2 diabetes mellitus without complications: Secondary | ICD-10-CM | POA: Diagnosis not present

## 2017-03-19 LAB — CBC WITH DIFFERENTIAL/PLATELET
Basophils Absolute: 0 10*3/uL (ref 0.0–0.2)
Basos: 1 %
EOS (ABSOLUTE): 0.2 10*3/uL (ref 0.0–0.4)
EOS: 3 %
HEMATOCRIT: 38.7 % (ref 34.0–46.6)
Hemoglobin: 12.9 g/dL (ref 11.1–15.9)
Immature Grans (Abs): 0 10*3/uL (ref 0.0–0.1)
Immature Granulocytes: 0 %
LYMPHS ABS: 1.7 10*3/uL (ref 0.7–3.1)
Lymphs: 26 %
MCH: 30.3 pg (ref 26.6–33.0)
MCHC: 33.3 g/dL (ref 31.5–35.7)
MCV: 91 fL (ref 79–97)
MONOCYTES: 8 %
Monocytes Absolute: 0.5 10*3/uL (ref 0.1–0.9)
NEUTROS ABS: 4 10*3/uL (ref 1.4–7.0)
Neutrophils: 62 %
Platelets: 214 10*3/uL (ref 150–379)
RBC: 4.26 x10E6/uL (ref 3.77–5.28)
RDW: 14.6 % (ref 12.3–15.4)
WBC: 6.4 10*3/uL (ref 3.4–10.8)

## 2017-03-19 LAB — COMPREHENSIVE METABOLIC PANEL
A/G RATIO: 1.8 (ref 1.2–2.2)
ALBUMIN: 4.1 g/dL (ref 3.5–4.8)
ALT: 7 IU/L (ref 0–32)
AST: 14 IU/L (ref 0–40)
Alkaline Phosphatase: 73 IU/L (ref 39–117)
BILIRUBIN TOTAL: 0.5 mg/dL (ref 0.0–1.2)
BUN / CREAT RATIO: 17 (ref 12–28)
BUN: 17 mg/dL (ref 8–27)
CO2: 24 mmol/L (ref 20–29)
Calcium: 9.3 mg/dL (ref 8.7–10.3)
Chloride: 104 mmol/L (ref 96–106)
Creatinine, Ser: 1.01 mg/dL — ABNORMAL HIGH (ref 0.57–1.00)
GFR calc non Af Amer: 53 mL/min/{1.73_m2} — ABNORMAL LOW (ref >59)
GFR, EST AFRICAN AMERICAN: 62 mL/min/{1.73_m2} (ref >59)
Globulin, Total: 2.3 g/dL (ref 1.5–4.5)
Glucose: 95 mg/dL (ref 65–99)
POTASSIUM: 3.6 mmol/L (ref 3.5–5.2)
Sodium: 143 mmol/L (ref 134–144)
TOTAL PROTEIN: 6.4 g/dL (ref 6.0–8.5)

## 2017-03-19 LAB — LIPID PANEL WITH LDL/HDL RATIO
Cholesterol, Total: 174 mg/dL (ref 100–199)
HDL: 41 mg/dL (ref >39)
LDL Calculated: 108 mg/dL — ABNORMAL HIGH (ref 0–99)
LDL/HDL RATIO: 2.6 ratio (ref 0.0–3.2)
TRIGLYCERIDES: 125 mg/dL (ref 0–149)
VLDL Cholesterol Cal: 25 mg/dL (ref 5–40)

## 2017-03-19 LAB — HEMOGLOBIN A1C
ESTIMATED AVERAGE GLUCOSE: 120 mg/dL
Hgb A1c MFr Bld: 5.8 % — ABNORMAL HIGH (ref 4.8–5.6)

## 2017-03-19 LAB — TSH: TSH: 2.35 u[IU]/mL (ref 0.450–4.500)

## 2017-03-22 ENCOUNTER — Telehealth: Payer: Self-pay | Admitting: Family Medicine

## 2017-03-22 NOTE — Telephone Encounter (Signed)
Pt requested a call back to get lab results. Please advise. Thanks TNP

## 2017-03-23 NOTE — Telephone Encounter (Signed)
Please review-Anastasiya V Hopkins, RMA  

## 2017-03-24 ENCOUNTER — Telehealth: Payer: Self-pay

## 2017-03-24 DIAGNOSIS — I340 Nonrheumatic mitral (valve) insufficiency: Secondary | ICD-10-CM | POA: Diagnosis not present

## 2017-03-24 DIAGNOSIS — E782 Mixed hyperlipidemia: Secondary | ICD-10-CM | POA: Diagnosis not present

## 2017-03-24 DIAGNOSIS — I10 Essential (primary) hypertension: Secondary | ICD-10-CM | POA: Diagnosis not present

## 2017-03-24 DIAGNOSIS — I739 Peripheral vascular disease, unspecified: Secondary | ICD-10-CM | POA: Diagnosis not present

## 2017-03-24 NOTE — Telephone Encounter (Signed)
Pt advised-[name] [name] V [name], RMA  

## 2017-03-24 NOTE — Telephone Encounter (Signed)
Called 2 times, kept ringing and then asks for some number to be enter, not able to leave a [employer], RMA

## 2017-03-24 NOTE — Telephone Encounter (Signed)
-----   Message from Jerrol Banana., MD sent at 03/24/2017 10:10 AM EST ----- Labs stable

## 2017-03-26 ENCOUNTER — Encounter: Payer: Self-pay | Admitting: Family Medicine

## 2017-04-14 ENCOUNTER — Ambulatory Visit (INDEPENDENT_AMBULATORY_CARE_PROVIDER_SITE_OTHER): Payer: PPO | Admitting: General Surgery

## 2017-04-14 VITALS — BP 140/82 | HR 80 | Resp 12 | Ht 64.0 in | Wt 150.0 lb

## 2017-04-14 DIAGNOSIS — N61 Mastitis without abscess: Secondary | ICD-10-CM | POA: Diagnosis not present

## 2017-04-14 MED ORDER — FLUCONAZOLE 100 MG PO TABS: 100.0000 mg | ORAL_TABLET | Freq: Every day | ORAL | 0 refills | Status: AC

## 2017-04-14 NOTE — Patient Instructions (Signed)
The patient is aware to call back for any questions or concerns.  

## 2017-04-14 NOTE — Progress Notes (Signed)
Patient ID: [name] [name], female   DOB: June 05, 1940, 77 y.o.   MRN: [id number]  Chief Complaint  Patient presents with  . Breast Problem    HPI [name] [name] is a 77 y.o. female.  Here for right breast evaluation. She states that 2 months ago she started having right breast redness. She states that the [facility] told her yeast infection and gave her nystatin cream and powder. She states it helped but 2-3 weeks ago it came back.  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  HPI  Past Medical History:  Diagnosis Date  . 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 Mucin noted on biopsy, not on wide excision.  . Coronary artery disease   . GERD (gastroesophageal reflux disease)   . Hyperlipidemia   . Hypertension   . MI (myocardial infarction) ([place])    2015    Past Surgical History:  Procedure Laterality Date  . ABDOMINAL HYSTERECTOMY    . APPENDECTOMY    . BREAST BIOPSY Right 07/23/2016   INVASIVE MAMMARY CARCINOMA WITH AREAS OF EXTRACELLULAR MUCIN  . COLONOSCOPY  2016  . CORONARY ANGIOPLASTY WITH STENT PLACEMENT    . CYSTOSCOPY    . PARTIAL MASTECTOMY WITH AXILLARY SENTINEL LYMPH NODE BIOPSY Right 08/09/2016   Procedure: PARTIAL MASTECTOMY WITH AXILLARY SENTINEL LYMPH NODE BIOPSY;  Surgeon: [name] Bellow, MD;  Location: [facility] ORS;  Service: General;  Laterality: Right;  . TONSILLECTOMY      Family History  Problem Relation Age of Onset  . Hyperlipidemia Mother   . Allergies Mother   . Cerebral aneurysm Mother        cause of death at age 49  . Heart disease Father        Fatal MI ag 39  . Cancer Brother        lung cancer  . Hyperlipidemia Brother   . Colonic polyp Brother   . Healthy Son   . Cancer - Lung Brother        colon  . Healthy Son     Social History Social History   Tobacco Use  . Smoking status: Former Smoker    Packs/day: 0.25    Years: 10.00    Pack years: 2.50    Types: Cigarettes   Last attempt to quit: 03/06/1998    Years since quitting: 19.1  . Smokeless tobacco: Never Used  Substance Use Topics  . Alcohol use: No  . Drug use: No    Allergies  Allergen Reactions  . Celebrex  [Celecoxib]     Dark stools.  . Penicillins Other (See Comments)    Childhood reaction Has patient had a PCN reaction causing immediate rash, facial/tongue/throat swelling, SOB or lightheadedness with hypotension: Unknown Has patient had a PCN reaction causing severe rash involving mucus membranes or skin necrosis: Unknown Has patient had a PCN reaction that required hospitalization: Unknown Has patient had a PCN reaction occurring within the last 10 years: Unknown If all of the above answers are "NO", then may proceed with Cephalosporin use.   Marland Kitchen Prevacid  [Lansoprazole] Diarrhea    Current Outpatient Medications  Medication Sig Dispense Refill  . aspirin EC 81 MG tablet Take 81 mg by mouth daily.    . Ferrous Sulfate (IRON) 28 MG TABS Take 28 mg by mouth daily.    . fluocinonide cream (LIDEX) 8.24 % Apply 1 application topically daily as needed (rash).    Marland Kitchen letrozole (  FEMARA) 2.5 MG tablet Take 1 tablet (2.5 mg total) by mouth daily. 90 tablet 3  . lisinopril (PRINIVIL,ZESTRIL) 20 MG tablet TAKE 1 TABLET BY MOUTH EVERY DAY    . nystatin (NYSTATIN) powder Apply topically 4 (four) times daily. Apply under right breast four times a day until symptoms resolve. 15 g 0  . nystatin-triamcinolone ointment (MYCOLOG) Apply 1 application topically 2 (two) times daily. 30 g 0  . omeprazole (PRILOSEC) 20 MG capsule Take 20 mg by mouth daily as needed (indigestion).    . pravastatin (PRAVACHOL) 20 MG tablet Take 20 mg by mouth daily.    . fluconazole (DIFLUCAN) 100 MG tablet Take 1 tablet (100 mg total) by mouth daily for 10 days. 10 tablet 0   No current facility-administered medications for this visit.     Review of Systems Review of Systems  Constitutional: Negative.   Respiratory:  Negative.   Cardiovascular: Negative.     Blood pressure 140/82, pulse 80, resp. rate 12, height 5\' 4"  (1.626 m), weight 150 lb (68 kg).  Physical Exam Physical Exam  Constitutional: She is oriented to person, place, and time. She appears well-developed and well-nourished.  Neurological: She is alert and oriented to person, place, and time.  Skin: Skin is warm and dry.    Data Reviewed Images sent to dermatology  Assessment    Atypical dermal reaction to radiation therapy.    Plan  Photos of skin lesions involving the right breast available in Media section of the chart.   Patient may try to use a baby diaper to help absorb moisture. Diflucan RX #10 May continue to use nystatin powder    Case discussed with Karle Barr, M.D. From Dermatology.  Appointment with her to be arranged by her office in the near future. F/U confirmed with patient.   HPI, Physical Exam, Assessment and Plan have been scribed under the direction and in the presence of [name] Bellow, MD. [name] Fetch, RN  I have completed the exam and reviewed the above documentation for accuracy and completeness.  I agree with the above.  Haematologist has been used and any errors in dictation or transcription are unintentional.  [name] [name], M.D., F.A.C.S.  [name] [name] [name] [name] 04/16/2017, 7:52 AM

## 2017-04-18 ENCOUNTER — Ambulatory Visit (INDEPENDENT_AMBULATORY_CARE_PROVIDER_SITE_OTHER): Payer: PPO | Admitting: Family Medicine

## 2017-04-18 ENCOUNTER — Other Ambulatory Visit: Payer: Self-pay

## 2017-04-18 VITALS — BP 158/70 | HR 82 | Temp 98.1°F | Resp 14 | Wt 153.0 lb

## 2017-04-18 DIAGNOSIS — I10 Essential (primary) hypertension: Secondary | ICD-10-CM

## 2017-04-18 MED ORDER — AMLODIPINE BESYLATE 2.5 MG PO TABS: 2.5000 mg | ORAL_TABLET | Freq: Every day | ORAL | 3 refills | Status: DC

## 2017-04-18 NOTE — Progress Notes (Signed)
[name] [name]  MRN: [id number] DOB: 04-10-1940  Subjective:  HPI   The patient is a 77 year old female who presents for follow up of her blood pressure.  Her last visit was on 03/18/17 and at that time she had been running low pressure readings.  She was taken off of her Amlodipine on that visit.  Since then she has been checking her blood pressure and she has had readings that were high and low.  She has had dizzy spells that occurred with both the high readings and the low readings.  She kept a record of it but forgot to bring it in.  She can't remember the exact readings but said that it was not overly high or low to the point she was concerned.    Patient Active Problem List   Diagnosis Date Noted  . Malignant neoplasm of upper-inner quadrant of right breast in female, estrogen receptor positive ([place]) 07/28/2016  . Gastric ulcer requiring drug therapy, chronic 01/23/2016  . MI (mitral incompetence) 07/22/2015  . Combined fat and carbohydrate induced hyperlipemia 12/26/2014  . UTI (lower urinary tract infection) 08/18/2014  . Blurry vision 08/18/2014  . Allergic rhinitis 07/12/2014  . Absolute anemia 07/12/2014  . Baker's cyst of knee 07/12/2014  . Atherosclerosis of coronary artery 07/12/2014  . CAFL (chronic airflow limitation) ([place]) 07/12/2014  . Dizziness 07/12/2014  . Essential (primary) hypertension 07/12/2014  . Acid reflux 07/12/2014  . 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 History of colon polyps 07/12/2014  . Hypercholesteremia 07/12/2014  . Malaise and fatigue 07/12/2014  . Heart attack ([place]) 07/12/2014  . Muscle ache 07/12/2014  . Arthritis, degenerative 07/12/2014  . Bradycardia 08/23/2013  . Arteriosclerosis of coronary artery 08/17/2013  . Diabetes ([place]) 08/17/2013  . Diabetes mellitus ([place]) 08/17/2013  . Peripheral vascular disease ([place]) 08/17/2013    Past Medical History:  Diagnosis Date  . Cancer ([place]) 07/23/2016   T1b, N0; ER/PR+, her -2 neu negative  invasive mammary carcinoma. Mucin noted on biopsy, not on wide excision.  . Coronary artery disease   . GERD (gastroesophageal reflux disease)   . Hyperlipidemia   . Hypertension   . MI (myocardial infarction) ([place])    2015    Social History   Socioeconomic History  . Marital status: Married    Spouse name: [name]  . Number of children: 2  . Years of education: Not on file  . Highest education level: Not on file  Social Needs  . Financial resource strain: Not on file  . Food insecurity - worry: Not on file  . Food insecurity - inability: Not on file  . Transportation needs - medical: Not on file  . Transportation needs - non-medical: Not on file  Occupational History  . Occupation: retired  Tobacco Use  . Smoking status: Former Smoker    Packs/day: 0.25    Years: 10.00    Pack years: 2.50    Types: Cigarettes    Last attempt to quit: 03/06/1998    Years since quitting: 19.1  . Smokeless tobacco: Never Used  Substance and Sexual Activity  . Alcohol use: No  . Drug use: No  . Sexual activity: Yes    Birth control/protection: Surgical  Other Topics Concern  . Not on file  Social History Narrative  . Not on file    Outpatient Encounter Medications as of 04/18/2017  Medication Sig Note  . aspirin EC 81 MG tablet Take 81 mg by mouth daily. 08/18/2014: Received from:  [facility] Received Sig: Take 81 mg by mouth.  . Ferrous Sulfate (IRON) 28 MG TABS Take 28 mg by mouth daily.   . fluconazole (DIFLUCAN) 100 MG tablet Take 1 tablet (100 mg total) by mouth daily for 10 days.   . fluocinonide cream (LIDEX) 8.34 % Apply 1 application topically daily as needed (rash).   Marland Kitchen letrozole (FEMARA) 2.5 MG tablet Take 1 tablet (2.5 mg total) by mouth daily.   Marland Kitchen lisinopril (PRINIVIL,ZESTRIL) 20 MG tablet TAKE 1 TABLET BY MOUTH EVERY DAY   . omeprazole (PRILOSEC) 20 MG capsule Take 20 mg by mouth daily as needed (indigestion).   . [DISCONTINUED] nystatin (NYSTATIN) powder Apply  topically 4 (four) times daily. Apply under right breast four times a day until symptoms resolve.   . [DISCONTINUED] nystatin-triamcinolone ointment (MYCOLOG) Apply 1 application topically 2 (two) times daily.   . [DISCONTINUED] pravastatin (PRAVACHOL) 20 MG tablet Take 20 mg by mouth daily.    No facility-administered encounter medications on file as of 04/18/2017.     Allergies  Allergen Reactions  . Celebrex  [Celecoxib]     Dark stools.  . Penicillins Other (See Comments)    Childhood reaction Has patient had a PCN reaction causing immediate rash, facial/tongue/throat swelling, SOB or lightheadedness with hypotension: Unknown Has patient had a PCN reaction causing severe rash involving mucus membranes or skin necrosis: Unknown Has patient had a PCN reaction that required hospitalization: Unknown Has patient had a PCN reaction occurring within the last 10 years: Unknown If all of the above answers are "NO", then may proceed with Cephalosporin use.   Marland Kitchen Prevacid  [Lansoprazole] Diarrhea    Review of Systems  Constitutional: Negative for fever and malaise/fatigue.  HENT: Negative.   Eyes: Negative.   Respiratory: Negative for cough, shortness of breath and wheezing.   Cardiovascular: Negative for chest pain, palpitations, orthopnea, claudication and leg swelling.  Gastrointestinal: Negative.   Skin: Negative.   Neurological: Positive for dizziness and headaches (allergy related). Negative for weakness.  Endo/Heme/Allergies: Negative.   Psychiatric/Behavioral: Negative.     Objective:  BP (!) 158/70 (BP Location: Right Arm, Patient Position: Sitting, Cuff Size: Normal)   Pulse 82   Temp 98.1 F (36.7 C) (Oral)   Resp 14   Wt 153 lb (69.4 kg)   BMI 26.26 kg/m   Physical Exam  Constitutional: She is oriented to person, place, and time and well-developed, well-nourished, and in no distress.  HENT:  Head: Normocephalic and atraumatic.  Eyes: Conjunctivae are normal. No  scleral icterus.  Neck: No thyromegaly present.  Cardiovascular: Normal rate, regular rhythm and normal heart sounds.  Pulmonary/Chest: Effort normal and breath sounds normal.  Abdominal: Soft.  Neurological: She is alert and oriented to person, place, and time.  Skin: Skin is warm and dry.  Significant rash on right breast.  Psychiatric: Mood, memory, affect and judgment normal.    Assessment and Plan :  1. Essential (primary) hypertension  -amLODipine (NORVASC) 2.5 MG tablet; Take 1 tablet (2.5 mg total) by mouth daily.  Dispense: 90 tablet; Refill: 3  2.Lichen Planus of Right breast area Treated by Dermatology.  I have done the exam and reviewed the chart and it is accurate to the best of my knowledge. Development worker, community has been used and  any errors in dictation or transcription are unintentional. Miguel Aschoff M.D. [facility]

## 2017-04-21 DIAGNOSIS — L439 Lichen planus, unspecified: Secondary | ICD-10-CM

## 2017-04-21 DIAGNOSIS — L309 Dermatitis, unspecified: Secondary | ICD-10-CM | POA: Diagnosis not present

## 2017-04-21 HISTORY — DX: Lichen planus, unspecified: L43.9

## 2017-05-02 ENCOUNTER — Ambulatory Visit
Admission: RE | Admit: 2017-05-02 | Discharge: 2017-05-02 | Disposition: A | Discharge status: Home / Self Care | Payer: PPO | Source: Ambulatory Visit | Attending: Oncology | Admitting: Oncology

## 2017-05-02 DIAGNOSIS — R928 Other abnormal and inconclusive findings on diagnostic imaging of breast: Secondary | ICD-10-CM | POA: Diagnosis not present

## 2017-05-02 DIAGNOSIS — C50211 Malignant neoplasm of upper-inner quadrant of right female breast: Secondary | ICD-10-CM

## 2017-05-02 DIAGNOSIS — Z923 Personal history of irradiation: Secondary | ICD-10-CM | POA: Diagnosis not present

## 2017-05-02 DIAGNOSIS — Z170 Estrogen receptor positive status [ER+]: Principal | ICD-10-CM

## 2017-05-02 HISTORY — DX: Personal history of irradiation: Z92.3

## 2017-05-03 ENCOUNTER — Encounter: Payer: Self-pay | Admitting: General Surgery

## 2017-05-05 ENCOUNTER — Encounter: Payer: Self-pay | Admitting: Radiation Oncology

## 2017-05-05 ENCOUNTER — Other Ambulatory Visit: Payer: Self-pay

## 2017-05-05 ENCOUNTER — Ambulatory Visit
Admission: RE | Admit: 2017-05-05 | Discharge: 2017-05-05 | Disposition: A | Discharge status: Home / Self Care | Payer: PPO | Source: Ambulatory Visit | Attending: Radiation Oncology | Admitting: Radiation Oncology

## 2017-05-05 VITALS — BP 184/88 | HR 75 | Temp 94.5°F | Resp 20 | Wt 149.5 lb

## 2017-05-05 DIAGNOSIS — Z170 Estrogen receptor positive status [ER+]: Secondary | ICD-10-CM | POA: Diagnosis not present

## 2017-05-05 DIAGNOSIS — Z923 Personal history of irradiation: Secondary | ICD-10-CM | POA: Insufficient documentation

## 2017-05-05 DIAGNOSIS — C50211 Malignant neoplasm of upper-inner quadrant of right female breast: Secondary | ICD-10-CM

## 2017-05-05 DIAGNOSIS — Z79811 Long term (current) use of aromatase inhibitors: Secondary | ICD-10-CM | POA: Insufficient documentation

## 2017-05-05 DIAGNOSIS — L439 Lichen planus, unspecified: Secondary | ICD-10-CM | POA: Insufficient documentation

## 2017-05-05 NOTE — Progress Notes (Signed)
Radiation Oncology Follow up Note  Name: [name] [name]   Date:   05/05/2017 MRN:  [id number] DOB: January 10, 1941    This 77 y.o. female presents to the clinic today for six-month follow-up status post whole breast radiation to her right breast for ER/PR positive invasive mammary carcinoma.  REFERRING PROVIDER: Jerrol Banana.,*  HPI: Patient is a 77 year old female now seen out 6 months having completed radiation therapy to her right breast for stage I ER/PR positive invasive mammary carcinoma. She had developed a unusual rash with ulceration of her right breast which was diagnosed by biopsy as lichen planus. She has been seen by dermatology is on steroid cream at this point and it is resolving. She recently had a mammogram back in February which was BI-RADS 2 benign which I have reviewed. She's currently on Femara tolerating that well without side effect.  COMPLICATIONS OF TREATMENT: present  FOLLOW UP COMPLIANCE: keeps appointments   PHYSICAL EXAM:  BP (!) 184/88   Pulse 75   Temp (!) 94.5 F (34.7 C)   Resp 20   Wt 149 lb 7.6 oz (67.8 kg)   BMI 25.66 kg/m  Right breast has areas which appear to be resolving but consistent with lichen planus. No open ulcerations are noted. No dominant mass or nodularity is noted in either breast in 2 positions examined. No axillary or supraclavicular adenopathy is appreciated. Well-developed well-nourished patient in NAD. HEENT reveals PERLA, EOMI, discs not visualized.  Oral cavity is clear. No oral mucosal lesions are identified. Neck is clear without evidence of cervical or supraclavicular adenopathy. Lungs are clear to A&P. Cardiac examination is essentially unremarkable with regular rate and rhythm without murmur rub or thrill. Abdomen is benign with no organomegaly or masses noted. Motor sensory and DTR levels are equal and symmetric in the upper and lower extremities. Cranial nerves II through XII are grossly intact. Proprioception is  intact. No peripheral adenopathy or edema is identified. No motor or sensory levels are noted. Crude visual fields are within normal range.  RADIOLOGY RESULTS: Mammograms are reviewed and compatible with the above-stated findings  PLAN: Present time patient is doing well with resolving unusual inflammation of the right breast status post radiation therapy. She continues on her steroid cream. She is also currently on Femara tolerating that well without side effect. I've asked to see her back in 6 months for follow-up. She continues close follow-up care with dermatology as well as surgeon. Patient knows to call with any concerns.  I would like to take this opportunity to thank you for allowing me to participate in the care of your patient.Marland Kitchen

## 2017-05-10 ENCOUNTER — Encounter: Payer: Self-pay | Admitting: General Surgery

## 2017-05-10 ENCOUNTER — Telehealth: Payer: Self-pay | Admitting: Family Medicine

## 2017-05-10 ENCOUNTER — Ambulatory Visit (INDEPENDENT_AMBULATORY_CARE_PROVIDER_SITE_OTHER): Payer: PPO | Admitting: General Surgery

## 2017-05-10 VITALS — BP 140/80 | HR 72 | Resp 13 | Ht 62.0 in | Wt 150.0 lb

## 2017-05-10 DIAGNOSIS — Z170 Estrogen receptor positive status [ER+]: Secondary | ICD-10-CM | POA: Diagnosis not present

## 2017-05-10 DIAGNOSIS — C50211 Malignant neoplasm of upper-inner quadrant of right female breast: Secondary | ICD-10-CM

## 2017-05-10 DIAGNOSIS — M25561 Pain in right knee: Secondary | ICD-10-CM

## 2017-05-10 DIAGNOSIS — L439 Lichen planus, unspecified: Secondary | ICD-10-CM | POA: Diagnosis not present

## 2017-05-10 NOTE — Telephone Encounter (Signed)
Pt informed. Referral made. 

## 2017-05-10 NOTE — Patient Instructions (Signed)
The patient has been asked to return to the office in one year with a bilateral diagnostic mammogram.The patient is aware to call back for any questions or concerns. 

## 2017-05-10 NOTE — Telephone Encounter (Signed)
ok 

## 2017-05-10 NOTE — Telephone Encounter (Signed)
Patient was told by Dr. Rosanna Randy if knee continued to hurt, to call back and we would refer to [facility] ASAP.   She has been there in the past.

## 2017-05-10 NOTE — Progress Notes (Signed)
Patient ID: [name] [name], female   DOB: 1940/03/14, 77 y.o.   MRN: [id number]  Chief Complaint  Patient presents with  . Follow-up    HPI [name] [name] is a 77 y.o. female who presents for a breast evaluation. The most recent mammogram was done on 05/02/2017.  Patient does perform regular self breast checks and gets regular mammograms done.  Patient is still using the cream on her right breast.   HPI  Past Medical History:  Diagnosis Date  . 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 Mucin noted on biopsy, not on wide excision.  . Coronary artery disease   . GERD (gastroesophageal reflux disease)   . Hyperlipidemia   . Hypertension   . Lichen planus 83/38/2505   Right breast in field of whole breast radiation. Dr. Kellie Moor DX by punch bioppsy  . Lichen planus    right breast   . MI (myocardial infarction) ([place])    2015  . Personal history of radiation therapy 09/2016   RIGHT lumpectomy w/ radiation    Past Surgical History:  Procedure Laterality Date  . ABDOMINAL HYSTERECTOMY    . APPENDECTOMY    . BREAST BIOPSY Right 07/23/2016   INVASIVE MAMMARY CARCINOMA WITH AREAS OF EXTRACELLULAR MUCIN  . BREAST EXCISIONAL BIOPSY     INVASIVE MUCINOUS MAMMARY CARCINOMA.   Marland Kitchen BREAST LUMPECTOMY Right 08/20/2016   INVASIVE MUCINOUS MAMMARY CARCINOMA. /Grade 2   . COLONOSCOPY  2016  . CORONARY ANGIOPLASTY WITH STENT PLACEMENT    . CYSTOSCOPY    . PARTIAL MASTECTOMY WITH AXILLARY SENTINEL LYMPH NODE BIOPSY Right 08/09/2016   Procedure: PARTIAL MASTECTOMY WITH AXILLARY SENTINEL LYMPH NODE BIOPSY;  Surgeon: Robert Bellow, MD;  Location: [facility] ORS;  Service: General;  Laterality: Right;  . TONSILLECTOMY      Family History  Problem Relation Age of Onset  . Hyperlipidemia Mother   . Allergies Mother   . Cerebral aneurysm Mother        cause of death at age 32  . Heart disease Father        Fatal MI ag 28  . Cancer Brother        lung cancer  . Hyperlipidemia Brother   . Colonic polyp Brother   . Healthy Son   . Cancer - Lung Brother        colon  . Healthy Son   . Breast cancer Neg Hx     Social History Social History   Tobacco Use  . Smoking status: Former Smoker    Packs/day: 0.25    Years: 10.00    Pack years: 2.50    Types: Cigarettes    Last attempt to quit: 03/06/1998    Years since quitting: 19.1  . Smokeless tobacco: Never Used  Substance Use Topics  . Alcohol use: No  . Drug use: No    Allergies  Allergen Reactions  . Celebrex  [Celecoxib]     Dark stools.  . Penicillins Other (See Comments)    Childhood reaction Has patient had a PCN reaction causing immediate rash, facial/tongue/throat swelling, SOB or lightheadedness with hypotension: Unknown Has patient had a PCN reaction causing severe rash involving mucus membranes or skin necrosis: Unknown Has patient had a PCN reaction that required hospitalization: Unknown Has patient had a PCN reaction occurring within the last 10 years: Unknown If all of the above answers are "NO", then may proceed with Cephalosporin use.   Marland Kitchen  Prevacid  [Lansoprazole] Diarrhea    Current Outpatient Medications  Medication Sig Dispense Refill  . amLODipine (NORVASC) 2.5 MG tablet Take 1 tablet (2.5 mg total) by mouth daily. 90 tablet 3  . aspirin EC 81 MG tablet Take 81 mg by mouth daily.    . Clobetasol Prop Emollient Base 0.05 % emollient cream     . fluocinonide cream (LIDEX) 4.65 % Apply 1 application topically daily as needed (rash).    Marland Kitchen letrozole (FEMARA) 2.5 MG tablet Take 1 tablet (2.5 mg total) by mouth daily. 90 tablet 3  . lisinopril (PRINIVIL,ZESTRIL) 20 MG tablet TAKE 1 TABLET BY MOUTH EVERY DAY    . omeprazole (PRILOSEC) 20 MG capsule Take 20 mg by mouth daily as needed (indigestion).    . triamcinolone cream (KENALOG) 0.1 % APPLY TWICE DAILY TO RASH ON RIGHT BREAST UNTIL CLEAR  0   No current facility-administered medications for this  visit.     Review of Systems Review of Systems  Constitutional: Negative.   Respiratory: Negative.   Cardiovascular: Negative.     Blood pressure 140/80, pulse 72, resp. rate 13, height 5\' 2"  (1.575 m), weight 150 lb (68 kg).  Physical Exam Physical Exam  Constitutional: She is oriented to person, place, and time. She appears well-developed and well-nourished.  Cardiovascular: Normal rate and normal heart sounds.  Pulmonary/Chest: Effort normal and breath sounds normal. Right breast exhibits skin change. Right breast exhibits no inverted nipple, no mass, no nipple discharge and no tenderness. Left breast exhibits no inverted nipple, no mass, no nipple discharge, no skin change and no tenderness.    Neurological: She is alert and oriented to person, place, and time.  Skin: Skin is warm and dry.    Data Reviewed May 02, 2017 bilateral diagnostic mammograms reviewed.  Postsurgical changes.  BI-RADS-2.  Assessment    Marked improvement in skin status post treatment for lichen planus.  Benign breast exam.    Plan  The patient has been asked to return to the office in one year with a bilateral diagnostic mammogram. The patient is aware to call back for any questions or concerns.    HPI, Physical Exam, Assessment and Plan have been scribed under the direction and in the presence of Hervey Ard, MD.  Gaspar Cola, CMA I have completed the exam and reviewed the above documentation for accuracy and completeness.  I agree with the above.  Haematologist has been used and any errors in dictation or transcription are unintentional.  Hervey Ard, M.D., F.A.C.S.   [name] Birdena Kingma 05/11/2017, 7:49 PM

## 2017-05-10 NOTE — Telephone Encounter (Signed)
Please advise. Thanks.  

## 2017-05-11 DIAGNOSIS — M1711 Unilateral primary osteoarthritis, right knee: Secondary | ICD-10-CM | POA: Diagnosis not present

## 2017-05-11 DIAGNOSIS — L439 Lichen planus, unspecified: Secondary | ICD-10-CM | POA: Insufficient documentation

## 2017-05-11 DIAGNOSIS — L438 Other lichen planus: Secondary | ICD-10-CM | POA: Diagnosis not present

## 2017-06-16 ENCOUNTER — Other Ambulatory Visit: Payer: Self-pay

## 2017-06-16 ENCOUNTER — Ambulatory Visit (INDEPENDENT_AMBULATORY_CARE_PROVIDER_SITE_OTHER): Payer: PPO | Admitting: Family Medicine

## 2017-06-16 VITALS — BP 140/76 | HR 76 | Temp 98.3°F | Resp 16 | Wt 150.0 lb

## 2017-06-16 DIAGNOSIS — D649 Anemia, unspecified: Secondary | ICD-10-CM | POA: Diagnosis not present

## 2017-06-16 DIAGNOSIS — E7800 Pure hypercholesterolemia, unspecified: Secondary | ICD-10-CM | POA: Diagnosis not present

## 2017-06-16 DIAGNOSIS — M791 Myalgia, unspecified site: Secondary | ICD-10-CM

## 2017-06-16 DIAGNOSIS — R7303 Prediabetes: Secondary | ICD-10-CM | POA: Diagnosis not present

## 2017-06-16 NOTE — Progress Notes (Signed)
[name] [name]  MRN: [id number] DOB: 1940-04-08  Subjective:  HPI   The patient is a 77 year old female who presents today for follow up of her hypertension.  In January when she was seen her blood pressure was running low.  At that time she was instructed to discontinue her Amlodipine and follow up 1 month later.  When she came back in February her blood pressure was up to 140/80.  She was then instructed to go back on the Amlodipine but at half the strength which put her at 2.5 mg daily.  Patient reports complying with the instructions and she is currently on 2.5 mg of Amlodipine along with her Lisinopril.  Patient was seen by her cardiologist in January for follow up after initiating Pravastatin for her cholesterol.  At that time she complained to them that she was having some muscle aches since starting the statin.  She was instructed to decrease the dose of the Pravastatin from 20 mg to 10 mg.  The patient has been compliant in this instruction and is currently on 10 mg.  She states that at first the aches began to improve however, they are now starting back again.  She states that curently she is only bothered by the myalgias in her shoulders, prior it was in various areas.   Patient Active Problem List   Diagnosis Date Noted  . Lichen planus 96/22/2979  . Malignant neoplasm of upper-inner quadrant of right breast in female, estrogen receptor positive ([place]) 07/28/2016  . Gastric ulcer requiring drug therapy, chronic 01/23/2016  . MI (mitral incompetence) 07/22/2015  . Combined fat and carbohydrate induced hyperlipemia 12/26/2014  . UTI (lower urinary tract infection) 08/18/2014  . Blurry vision 08/18/2014  . Allergic rhinitis 07/12/2014  . Absolute anemia 07/12/2014  . Baker's cyst of knee 07/12/2014  . Atherosclerosis of coronary artery 07/12/2014  . CAFL (chronic airflow limitation) ([place]) 07/12/2014  . Dizziness 07/12/2014  . Essential (primary) hypertension 07/12/2014  . Acid  reflux 07/12/2014  . 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 History of colon polyps 07/12/2014  . Hypercholesteremia 07/12/2014  . Malaise and fatigue 07/12/2014  . Heart attack ([place]) 07/12/2014  . Muscle ache 07/12/2014  . Arthritis, degenerative 07/12/2014  . Bradycardia 08/23/2013  . Arteriosclerosis of coronary artery 08/17/2013  . Diabetes ([place]) 08/17/2013  . Diabetes mellitus ([place]) 08/17/2013  . Peripheral vascular disease ([place]) 08/17/2013    Past Medical History:  Diagnosis Date  . 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 Mucin noted on biopsy, not on wide excision.  . Coronary artery disease   . GERD (gastroesophageal reflux disease)   . Hyperlipidemia   . Hypertension   . Lichen planus 89/21/1941   Right breast in field of whole breast radiation. Dr. Kellie Moor DX by punch bioppsy  . Lichen planus    right breast   . MI (myocardial infarction) ([place])    2015  . Personal history of radiation therapy 09/2016   RIGHT lumpectomy w/ radiation    Social History   Socioeconomic History  . Marital status: Married    Spouse name: [name]  . Number of children: 2  . Years of education: Not on file  . Highest education level: Not on file  Occupational History  . Occupation: retired  Scientific laboratory technician  . Financial resource strain: Not on file  . Food insecurity:    Worry: Not on file    Inability: Not on file  .  Transportation needs:    Medical: Not on file    Non-medical: Not on file  Tobacco Use  . Smoking status: Former Smoker    Packs/day: 0.25    Years: 10.00    Pack years: 2.50    Types: Cigarettes    Last attempt to quit: 03/06/1998    Years since quitting: 19.2  . Smokeless tobacco: Never Used  Substance and Sexual Activity  . Alcohol use: No  . Drug use: No  . Sexual activity: Yes    Birth control/protection: Surgical  Lifestyle  . Physical activity:    Days per week: Not on file    Minutes per session: Not  on file  . Stress: Not on file  Relationships  . Social connections:    Talks on phone: Not on file    Gets together: Not on file    Attends religious service: Not on file    Active member of club or organization: Not on file    Attends meetings of clubs or organizations: Not on file    Relationship status: Not on file  . Intimate partner violence:    Fear of current or ex partner: Not on file    Emotionally abused: Not on file    Physically abused: Not on file    Forced sexual activity: Not on file  Other Topics Concern  . Not on file  Social History Narrative  . Not on file    Outpatient Encounter Medications as of 06/16/2017  Medication Sig Note  . amLODipine (NORVASC) 2.5 MG tablet Take 1 tablet (2.5 mg total) by mouth daily.   Marland Kitchen aspirin EC 81 MG tablet Take 81 mg by mouth daily. 08/18/2014: Received from: [place]: Take 81 mg by mouth.  . Clobetasol Prop Emollient Base 0.05 % emollient cream    . fluocinonide cream (LIDEX) 0.63 % Apply 1 application topically daily as needed (rash).   Marland Kitchen letrozole (FEMARA) 2.5 MG tablet Take 1 tablet (2.5 mg total) by mouth daily.   Marland Kitchen lisinopril (PRINIVIL,ZESTRIL) 20 MG tablet TAKE 1 TABLET BY MOUTH EVERY DAY   . omeprazole (PRILOSEC) 20 MG capsule Take 20 mg by mouth daily as needed (indigestion).   . pravastatin (PRAVACHOL) 10 MG tablet Take 10 mg by mouth daily.   Marland Kitchen triamcinolone cream (KENALOG) 0.1 % APPLY TWICE DAILY TO RASH ON RIGHT BREAST UNTIL CLEAR    No facility-administered encounter medications on file as of 06/16/2017.     Allergies  Allergen Reactions  . Celebrex  [Celecoxib]     Dark stools.  . Penicillins Other (See Comments)    Childhood reaction Has patient had a PCN reaction causing immediate rash, facial/tongue/throat swelling, SOB or lightheadedness with hypotension: Unknown Has patient had a PCN reaction causing severe rash involving mucus membranes or skin necrosis: Unknown Has patient had a PCN  reaction that required hospitalization: Unknown Has patient had a PCN reaction occurring within the last 10 years: Unknown If all of the above answers are "NO", then may proceed with Cephalosporin use.   Marland Kitchen Prevacid  [Lansoprazole] Diarrhea    Review of Systems  Constitutional: Negative for fever and malaise/fatigue.  Eyes: Negative.   Respiratory: Negative for cough, shortness of breath and wheezing.   Cardiovascular: Negative for chest pain, palpitations, orthopnea, claudication and leg swelling.  Gastrointestinal: Negative.   Musculoskeletal: Positive for myalgias.  Skin: Negative.   Neurological: Negative for dizziness, weakness and headaches.  Endo/Heme/Allergies: Negative.   Psychiatric/Behavioral: Negative.  Objective:  BP 140/76 (BP Location: Right Arm, Patient Position: Sitting, Cuff Size: Normal)   Pulse 76   Temp 98.3 F (36.8 C) (Oral)   Resp 16   Wt 150 lb (68 kg)   BMI 27.44 kg/m   Physical Exam  Constitutional: She is oriented to person, place, and time and well-developed, well-nourished, and in no distress.  HENT:  Head: Normocephalic and atraumatic.  Right Ear: External ear normal.  Left Ear: External ear normal.  Nose: Nose normal.  Eyes: Conjunctivae are normal. No scleral icterus.  Neck: No thyromegaly present.  Cardiovascular: Normal rate and regular rhythm.  Pulmonary/Chest: Effort normal and breath sounds normal.  Abdominal: Soft.  Neurological: She is alert and oriented to person, place, and time.  Skin: Skin is warm and dry.  Psychiatric: Mood, memory, affect and judgment normal.    Assessment and Plan :  HTN HLD Tolerating statin for most part,Check CK and other f/u labs. Anemia CBC. RTC 4 months  I have done the exam and reviewed the chart and it is accurate to the best of my knowledge. Development worker, community has been used and  any errors in dictation or transcription are unintentional. Miguel Aschoff M.D. [facility]

## 2017-06-17 LAB — HEPATIC FUNCTION PANEL
ALBUMIN: 4 g/dL (ref 3.5–4.8)
ALK PHOS: 75 IU/L (ref 39–117)
ALT: 10 IU/L (ref 0–32)
AST: 16 IU/L (ref 0–40)
BILIRUBIN TOTAL: 0.6 mg/dL (ref 0.0–1.2)
BILIRUBIN, DIRECT: 0.14 mg/dL (ref 0.00–0.40)
Total Protein: 6.5 g/dL (ref 6.0–8.5)

## 2017-06-17 LAB — LIPID PANEL WITH LDL/HDL RATIO
CHOLESTEROL TOTAL: 214 mg/dL — AB (ref 100–199)
HDL: 39 mg/dL — AB (ref >39)
LDL CALC: 149 mg/dL — AB (ref 0–99)
LDl/HDL Ratio: 3.8 ratio — ABNORMAL HIGH (ref 0.0–3.2)
TRIGLYCERIDES: 129 mg/dL (ref 0–149)
VLDL Cholesterol Cal: 26 mg/dL (ref 5–40)

## 2017-06-17 LAB — CBC WITH DIFFERENTIAL/PLATELET
BASOS: 2 %
Basophils Absolute: 0.1 10*3/uL (ref 0.0–0.2)
EOS (ABSOLUTE): 0.2 10*3/uL (ref 0.0–0.4)
EOS: 3 %
HEMATOCRIT: 37.4 % (ref 34.0–46.6)
HEMOGLOBIN: 11.4 g/dL (ref 11.1–15.9)
IMMATURE GRANULOCYTES: 0 %
Immature Grans (Abs): 0 10*3/uL (ref 0.0–0.1)
Lymphocytes Absolute: 1.3 10*3/uL (ref 0.7–3.1)
Lymphs: 23 %
MCH: 28 pg (ref 26.6–33.0)
MCHC: 30.5 g/dL — ABNORMAL LOW (ref 31.5–35.7)
MCV: 92 fL (ref 79–97)
MONOCYTES: 6 %
Monocytes Absolute: 0.3 10*3/uL (ref 0.1–0.9)
NEUTROS PCT: 66 %
Neutrophils Absolute: 3.7 10*3/uL (ref 1.4–7.0)
Platelets: 257 10*3/uL (ref 150–379)
RBC: 4.07 x10E6/uL (ref 3.77–5.28)
RDW: 15.1 % (ref 12.3–15.4)
WBC: 5.5 10*3/uL (ref 3.4–10.8)

## 2017-06-17 LAB — HEMOGLOBIN A1C
Est. average glucose Bld gHb Est-mCnc: 120 mg/dL
Hgb A1c MFr Bld: 5.8 % — ABNORMAL HIGH (ref 4.8–5.6)

## 2017-06-17 LAB — CK: Total CK: 96 U/L (ref 24–173)

## 2017-06-20 ENCOUNTER — Telehealth: Payer: Self-pay | Admitting: Emergency Medicine

## 2017-06-20 ENCOUNTER — Other Ambulatory Visit: Payer: PPO

## 2017-06-20 ENCOUNTER — Ambulatory Visit: Payer: PPO | Admitting: Oncology

## 2017-06-20 NOTE — Telephone Encounter (Signed)
-----   Message from Jerrol Banana., MD sent at 06/19/2017  8:13 PM EDT ----- Stable

## 2017-06-20 NOTE — Telephone Encounter (Signed)
LMTCB

## 2017-06-22 NOTE — Telephone Encounter (Signed)
Advised  ED 

## 2017-07-03 NOTE — Progress Notes (Signed)
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  INTERVAL HISTORY: Patient returns to clinic today for routine six-month evaluation.  She continues to tolerate letrozole well without significant side effects. She has no neurologic complaints. She denies any recent fevers or illnesses. She has a good appetite and denies weight loss. She has no chest pain or shortness of breath. She denies any nausea, vomiting, constipation, or diarrhea. She has no urinary complaints.  Patient feels at her baseline offers no specific complaints today.  REVIEW OF SYSTEMS:   Review of Systems  Constitutional: Negative.  Negative for fever, malaise/fatigue and weight loss.  Respiratory: Negative.  Negative for cough and shortness of breath.   Cardiovascular: Negative.  Negative for chest pain and leg swelling.  Gastrointestinal: Negative.  Negative for abdominal pain and constipation.  Genitourinary: Negative.  Negative for dysuria.  Musculoskeletal: Negative.  Negative for back pain.  Skin: Negative.  Negative for rash.  Neurological: Negative.  Negative for sensory change, focal weakness and weakness.  Psychiatric/Behavioral: Negative.  The patient is not nervous/anxious.     As per HPI. Otherwise, a complete review of systems is negative.  PAST MEDICAL HISTORY: Past Medical History:  Diagnosis Date  . 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 Mucin noted on biopsy, not on wide excision.  . Coronary artery disease   . GERD (gastroesophageal reflux disease)   . Hyperlipidemia   . Hypertension   . Lichen planus 96/29/5284   Right breast in field of whole breast radiation. Dr. Kellie Moor DX by punch bioppsy  . Lichen planus    right breast   . MI (myocardial infarction) ([place])    2015  . Personal history of radiation therapy 09/2016   RIGHT lumpectomy w/ radiation    PAST SURGICAL HISTORY: Past Surgical History:  Procedure Laterality Date  . ABDOMINAL HYSTERECTOMY    . APPENDECTOMY    . BREAST BIOPSY Right 07/23/2016   INVASIVE MAMMARY CARCINOMA WITH AREAS OF EXTRACELLULAR MUCIN  . BREAST EXCISIONAL BIOPSY     INVASIVE MUCINOUS MAMMARY CARCINOMA.   Marland Kitchen BREAST LUMPECTOMY Right 08/20/2016   INVASIVE MUCINOUS MAMMARY CARCINOMA. /Grade 2   . COLONOSCOPY  2016  . CORONARY ANGIOPLASTY WITH STENT PLACEMENT    . CYSTOSCOPY    . PARTIAL MASTECTOMY WITH AXILLARY SENTINEL LYMPH NODE BIOPSY Right 08/09/2016   Procedure: PARTIAL MASTECTOMY WITH AXILLARY SENTINEL LYMPH NODE BIOPSY;  Surgeon: Robert Bellow, MD;  Location: [facility] ORS;  Service: General;  Laterality: Right;  . TONSILLECTOMY      FAMILY HISTORY: Family History  Problem Relation Age of Onset  . Hyperlipidemia Mother   . Allergies Mother   . Cerebral aneurysm Mother        cause of death at age 15  . Heart disease Father        Fatal MI ag 67  . Cancer Brother        lung cancer  . Hyperlipidemia Brother   .  Colonic polyp Brother   . Healthy Son   . Cancer - Lung Brother        colon  . Healthy Son   . Breast cancer Neg Hx     ADVANCED DIRECTIVES (Y/N):  N  HEALTH MAINTENANCE: Social History   Tobacco Use  . Smoking status: Former Smoker    Packs/day: 0.25    Years: 10.00    Pack years: 2.50    Types: Cigarettes    Last attempt to quit: 03/06/1998    Years since quitting: 19.3  . Smokeless  tobacco: Never Used  Substance Use Topics  . Alcohol use: No  . Drug use: No     Colonoscopy:  PAP:  Bone density:  Lipid panel:  Allergies  Allergen Reactions  . Celebrex  [Celecoxib]     Dark stools.  . Penicillins Other (See Comments)    Childhood reaction Has patient had a PCN reaction causing immediate rash, facial/tongue/throat swelling, SOB or lightheadedness with hypotension: Unknown Has patient had a PCN reaction causing severe rash involving mucus membranes or skin necrosis: Unknown Has patient had a PCN reaction that required hospitalization: Unknown Has patient had a PCN reaction occurring within the last 10 years: Unknown If all of the above answers are "NO", then may proceed with Cephalosporin use.   Marland Kitchen Prevacid  [Lansoprazole] Diarrhea    Current Outpatient Medications  Medication Sig Dispense Refill  . amLODipine (NORVASC) 2.5 MG tablet Take 1 tablet (2.5 mg total) by mouth daily. 90 tablet 3  . aspirin EC 81 MG tablet Take 81 mg by mouth daily.    . Clobetasol Prop Emollient Base 0.05 % emollient cream     . fluocinonide cream (LIDEX) 0.93 % Apply 1 application topically daily as needed (rash).    Marland Kitchen letrozole (FEMARA) 2.5 MG tablet Take 1 tablet (2.5 mg total) by mouth daily. 90 tablet 3  . lisinopril (PRINIVIL,ZESTRIL) 20 MG tablet TAKE 1 TABLET BY MOUTH EVERY DAY    . omeprazole (PRILOSEC) 20 MG capsule Take 20 mg by mouth daily as needed (indigestion).    . pravastatin (PRAVACHOL) 10 MG tablet Take 10 mg by mouth daily.    Marland Kitchen triamcinolone cream (KENALOG) 0.1 % APPLY TWICE DAILY TO RASH ON RIGHT BREAST UNTIL CLEAR  0   No current facility-administered medications for this visit.     OBJECTIVE: Vitals:   07/04/17 1102  BP: (!) 149/84  Pulse: 73  Resp: 20  Temp: (!) 97 F (36.1 C)     Body mass index is 28.15 kg/m.    ECOG FS:0 - Asymptomatic  General: Well-developed, well-nourished, no acute distress. Eyes: Pink conjunctiva, anicteric  sclera. Breast: Patient declined exam today. Lungs: Clear to auscultation bilaterally. Heart: Regular rate and rhythm. No rubs, murmurs, or gallops. Abdomen: Soft, nontender, nondistended. No organomegaly noted, normoactive bowel sounds. Musculoskeletal: No edema, cyanosis, or clubbing. Neuro: Alert, answering all questions appropriately. Cranial nerves grossly intact. Skin: No rashes or petechiae noted. Psych: Normal affect.  LAB RESULTS:  Lab Results  Component Value Date   NA 143 03/18/2017   K 3.6 03/18/2017   CL 104 03/18/2017   CO2 24 03/18/2017   GLUCOSE 95 03/18/2017   BUN 17 03/18/2017   CREATININE 1.01 (H) 03/18/2017   CALCIUM 9.3 03/18/2017   PROT 6.5 06/16/2017   ALBUMIN 4.0 06/16/2017   AST 16 06/16/2017   ALT 10 06/16/2017   ALKPHOS 75 06/16/2017   BILITOT 0.6 06/16/2017   GFRNONAA 53 (  L) 03/18/2017   GFRAA 62 03/18/2017    Lab Results  Component Value Date   WBC 5.5 06/16/2017   NEUTROABS 3.7 06/16/2017   HGB 11.4 06/16/2017   HCT 37.4 06/16/2017   MCV 92 06/16/2017   PLT 257 06/16/2017     STUDIES: No results found.  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  PLAN:    1. 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 Given the size of the tumor, no Oncotype DX was ordered and no adjuvant chemotherapy was recommended. She completed adjuvant XRT in August 2018.  Continue letrozole for a total of 5 years completing in August 2023.  Her most recent mammogram on May 02, 2017 was reported as BI-RADS 2, repeat in February 2020.  Return to clinic in 6 months for routine evaluation.  2.  Osteopenia: Bone mineral density completed on January 24, 2017 revealed a T score of -1.8.  Continue calcium and  vitamin D supplementation.  Repeat in November 2019.  Approximately 20 minutes was spent in discussion of which greater than 50% was consultation.  Patient expressed understanding and was in agreement with this plan. She also understands that She can call clinic at any time with any questions, concerns, or complaints.   Cancer Staging Malignant neoplasm of upper-inner quadrant of right breast in female, estrogen receptor positive ([place]) Staging form: Breast, AJCC 8th Edition - Pathologic stage from 08/30/2016: Stage IA (pT1b, pN0, cM0, G2, ER: Positive, PR: Positive, HER2: Negative) - Signed by Lloyd Huger, MD on 08/30/2016   Lloyd Huger, MD   07/05/2017 10:49 PM

## 2017-07-04 ENCOUNTER — Inpatient Hospital Stay (HOSPITAL_BASED_OUTPATIENT_CLINIC_OR_DEPARTMENT_OTHER): Payer: PPO | Admitting: Oncology

## 2017-07-04 ENCOUNTER — Other Ambulatory Visit: Payer: Self-pay

## 2017-07-04 ENCOUNTER — Inpatient Hospital Stay: Payer: PPO | Attending: Oncology

## 2017-07-04 ENCOUNTER — Encounter: Payer: Self-pay | Admitting: Oncology

## 2017-07-04 VITALS — BP 149/84 | HR 73 | Temp 97.0°F | Resp 20 | Wt 153.9 lb

## 2017-07-04 DIAGNOSIS — Z87891 Personal history of nicotine dependence: Secondary | ICD-10-CM

## 2017-07-04 DIAGNOSIS — Z7982 Long term (current) use of aspirin: Secondary | ICD-10-CM | POA: Insufficient documentation

## 2017-07-04 DIAGNOSIS — I252 Old myocardial infarction: Secondary | ICD-10-CM

## 2017-07-04 DIAGNOSIS — C50211 Malignant neoplasm of upper-inner quadrant of right female breast: Secondary | ICD-10-CM | POA: Diagnosis not present

## 2017-07-04 DIAGNOSIS — I2510 Atherosclerotic heart disease of native coronary artery without angina pectoris: Secondary | ICD-10-CM | POA: Diagnosis not present

## 2017-07-04 DIAGNOSIS — Z79811 Long term (current) use of aromatase inhibitors: Secondary | ICD-10-CM | POA: Diagnosis not present

## 2017-07-04 DIAGNOSIS — K219 Gastro-esophageal reflux disease without esophagitis: Secondary | ICD-10-CM | POA: Insufficient documentation

## 2017-07-04 DIAGNOSIS — M8580 Other specified disorders of bone density and structure, unspecified site: Secondary | ICD-10-CM | POA: Insufficient documentation

## 2017-07-04 DIAGNOSIS — E785 Hyperlipidemia, unspecified: Secondary | ICD-10-CM

## 2017-07-04 DIAGNOSIS — Z170 Estrogen receptor positive status [ER+]: Secondary | ICD-10-CM | POA: Insufficient documentation

## 2017-07-04 DIAGNOSIS — I10 Essential (primary) hypertension: Secondary | ICD-10-CM | POA: Diagnosis not present

## 2017-07-04 DIAGNOSIS — Z79899 Other long term (current) drug therapy: Secondary | ICD-10-CM | POA: Insufficient documentation

## 2017-07-04 DIAGNOSIS — Z800 Family history of malignant neoplasm of digestive organs: Secondary | ICD-10-CM | POA: Insufficient documentation

## 2017-07-04 DIAGNOSIS — Z801 Family history of malignant neoplasm of trachea, bronchus and lung: Secondary | ICD-10-CM | POA: Diagnosis not present

## 2017-07-04 NOTE — Progress Notes (Signed)
Patient denies any concerns today.  

## 2017-07-12 DIAGNOSIS — I340 Nonrheumatic mitral (valve) insufficiency: Secondary | ICD-10-CM | POA: Diagnosis not present

## 2017-07-12 DIAGNOSIS — I739 Peripheral vascular disease, unspecified: Secondary | ICD-10-CM | POA: Diagnosis not present

## 2017-07-12 DIAGNOSIS — I2510 Atherosclerotic heart disease of native coronary artery without angina pectoris: Secondary | ICD-10-CM | POA: Diagnosis not present

## 2017-07-12 DIAGNOSIS — I10 Essential (primary) hypertension: Secondary | ICD-10-CM | POA: Diagnosis not present

## 2017-07-12 DIAGNOSIS — E782 Mixed hyperlipidemia: Secondary | ICD-10-CM | POA: Diagnosis not present

## 2017-07-14 DIAGNOSIS — M1711 Unilateral primary osteoarthritis, right knee: Secondary | ICD-10-CM | POA: Diagnosis not present

## 2017-08-08 ENCOUNTER — Ambulatory Visit: Payer: PPO | Admitting: Oncology

## 2017-08-30 ENCOUNTER — Ambulatory Visit (INDEPENDENT_AMBULATORY_CARE_PROVIDER_SITE_OTHER): Payer: PPO | Admitting: Family Medicine

## 2017-08-30 VITALS — BP 134/76 | HR 66 | Temp 98.2°F | Resp 16 | Wt 152.0 lb

## 2017-08-30 DIAGNOSIS — B379 Candidiasis, unspecified: Secondary | ICD-10-CM

## 2017-08-30 DIAGNOSIS — Z170 Estrogen receptor positive status [ER+]: Secondary | ICD-10-CM | POA: Diagnosis not present

## 2017-08-30 DIAGNOSIS — C50211 Malignant neoplasm of upper-inner quadrant of right female breast: Secondary | ICD-10-CM

## 2017-08-30 MED ORDER — NYSTATIN 100000 UNIT/GM EX CREA: 1.0000 "application " | TOPICAL_CREAM | Freq: Two times a day (BID) | CUTANEOUS | 0 refills | Status: DC

## 2017-08-30 NOTE — Progress Notes (Signed)
[name] [name]  MRN: [id number] DOB: Apr 25, 1940  Subjective:  HPI   The patient is a 77 year old female who has finished with radiation treatment for her breast cancer.  The radiation left the patient with some burns under and around her breasts.  She is concerned because she finished her treatment several months ago and the area is getting worse not better.  She states that last week she noticed some blisters appearing under her breasts.  Patient Active Problem List   Diagnosis Date Noted  . Lichen planus 96/29/5284  . Malignant neoplasm of upper-inner quadrant of right breast in female, estrogen receptor positive ([place]) 07/28/2016  . Gastric ulcer requiring drug therapy, chronic 01/23/2016  . MI (mitral incompetence) 07/22/2015  . Combined fat and carbohydrate induced hyperlipemia 12/26/2014  . UTI (lower urinary tract infection) 08/18/2014  . Blurry vision 08/18/2014  . Allergic rhinitis 07/12/2014  . Absolute anemia 07/12/2014  . Baker's cyst of knee 07/12/2014  . Atherosclerosis of coronary artery 07/12/2014  . CAFL (chronic airflow limitation) ([place]) 07/12/2014  . Dizziness 07/12/2014  . Essential (primary) hypertension 07/12/2014  . Acid reflux 07/12/2014  . 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 History of colon polyps 07/12/2014  . Hypercholesteremia 07/12/2014  . Malaise and fatigue 07/12/2014  . Heart attack ([place]) 07/12/2014  . Muscle ache 07/12/2014  . Arthritis, degenerative 07/12/2014  . Bradycardia 08/23/2013  . Arteriosclerosis of coronary artery 08/17/2013  . Diabetes ([place]) 08/17/2013  . Diabetes mellitus ([place]) 08/17/2013  . Peripheral vascular disease ([place]) 08/17/2013    Past Medical History:  Diagnosis Date  . 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 Mucin noted on biopsy, not on wide excision.  . Coronary artery disease   . GERD (gastroesophageal reflux disease)   . Hyperlipidemia   . Hypertension   .  Lichen planus 13/24/4010   Right breast in field of whole breast radiation. Dr. Kellie Moor DX by punch bioppsy  . Lichen planus    right breast   . MI (myocardial infarction) ([place])    2015  . Personal history of radiation therapy 09/2016   RIGHT lumpectomy w/ radiation    Social History   Socioeconomic History  . Marital status: Married    Spouse name: [name]  . Number of children: 2  . Years of education: Not on file  . Highest education level: Not on file  Occupational History  . Occupation: retired  Scientific laboratory technician  . Financial resource strain: Not on file  . Food insecurity:    Worry: Not on file    Inability: Not on file  . Transportation needs:    Medical: Not on file    Non-medical: Not on file  Tobacco Use  . Smoking status: Former Smoker    Packs/day: 0.25    Years: 10.00    Pack years: 2.50    Types: Cigarettes    Last attempt to quit: 03/06/1998    Years since quitting: 19.4  . Smokeless tobacco: Never Used  Substance and Sexual Activity  . Alcohol use: No  . Drug use: No  . Sexual activity: Yes    Birth control/protection: Surgical  Lifestyle  . Physical activity:    Days per week: Not on file    Minutes per session: Not on file  . Stress: Not on file  Relationships  . Social connections:    Talks on phone: Not on file    Gets together: Not on  file    Attends religious service: Not on file    Active member of club or organization: Not on file    Attends meetings of clubs or organizations: Not on file    Relationship status: Not on file  . Intimate partner violence:    Fear of current or ex partner: Not on file    Emotionally abused: Not on file    Physically abused: Not on file    Forced sexual activity: Not on file  Other Topics Concern  . Not on file  Social History Narrative  . Not on file    Outpatient Encounter Medications as of 08/30/2017  Medication Sig Note  . amLODipine (NORVASC) 2.5 MG tablet Take 1 tablet (2.5 mg total) by mouth  daily.   Marland Kitchen aspirin EC 81 MG tablet Take 81 mg by mouth daily. 08/18/2014: Received from: [place]: Take 81 mg by mouth.  . Clobetasol Prop Emollient Base 0.05 % emollient cream    . fluocinonide cream (LIDEX) 8.52 % Apply 1 application topically daily as needed (rash).   Marland Kitchen letrozole (FEMARA) 2.5 MG tablet Take 1 tablet (2.5 mg total) by mouth daily.   Marland Kitchen lisinopril (PRINIVIL,ZESTRIL) 20 MG tablet TAKE 1 TABLET BY MOUTH EVERY DAY   . omeprazole (PRILOSEC) 20 MG capsule Take 20 mg by mouth daily as needed (indigestion).   . pravastatin (PRAVACHOL) 10 MG tablet Take 10 mg by mouth daily.   Marland Kitchen triamcinolone cream (KENALOG) 0.1 % APPLY TWICE DAILY TO RASH ON RIGHT BREAST UNTIL CLEAR    No facility-administered encounter medications on file as of 08/30/2017.     Allergies  Allergen Reactions  . Celebrex  [Celecoxib]     Dark stools.  . Penicillins Other (See Comments)    Childhood reaction Has patient had a PCN reaction causing immediate rash, facial/tongue/throat swelling, SOB or lightheadedness with hypotension: Unknown Has patient had a PCN reaction causing severe rash involving mucus membranes or skin necrosis: Unknown Has patient had a PCN reaction that required hospitalization: Unknown Has patient had a PCN reaction occurring within the last 10 years: Unknown If all of the above answers are "NO", then may proceed with Cephalosporin use.   Marland Kitchen Prevacid  [Lansoprazole] Diarrhea    Review of Systems  Constitutional: Negative for fever.  HENT: Negative.   Eyes: Negative.   Respiratory: Negative for cough, shortness of breath and wheezing.   Cardiovascular: Negative for chest pain, palpitations and claudication.  Gastrointestinal: Negative.   Genitourinary: Negative.   Skin: Positive for rash.       Rash/burn/blister under the breasts with the right worse than the left.  Endo/Heme/Allergies: Negative.   Psychiatric/Behavioral: Negative.     Objective:  BP 134/76 (BP  Location: Right Arm, Patient Position: Sitting, Cuff Size: Normal)   Pulse 66   Temp 98.2 F (36.8 C) (Oral)   Resp 16   Wt 152 lb (68.9 kg)   BMI 27.80 kg/m   Physical Exam  Constitutional: She is oriented to person, place, and time and well-developed, well-nourished, and in no distress.  HENT:  Head: Normocephalic and atraumatic.  Right Ear: External ear normal.  Left Ear: External ear normal.  Nose: Nose normal.  Eyes: Conjunctivae are normal. No scleral icterus.  Neck: No thyromegaly present.  Cardiovascular: Normal rate, regular rhythm and normal heart sounds.  Pulmonary/Chest: Effort normal and breath sounds normal.  Abdominal: Soft.  Neurological: She is alert and oriented to person, place, and time. Gait  normal. GCS score is 15.  Skin: Rash noted.  Skin changes of right breast--chronic,mild changes under breast possible c/w yeast.  Psychiatric: Mood, memory, affect and judgment normal.    Assessment and Plan :  1. Monilia infection Patient is to try Mycostatin first and if this does not help she will call the office and we will give her Silvadene cream. Patient has been advised to wear cotton t-shirt if possible and keep the area clean and as dry as possible. - nystatin cream (MYCOSTATIN); Apply 1 application topically 2 (two) times daily.  Dispense: 30 g; Refill: 0 2.s/p Radiation for Breast Cancer

## 2017-09-02 ENCOUNTER — Other Ambulatory Visit: Payer: Self-pay

## 2017-09-05 ENCOUNTER — Telehealth: Payer: Self-pay | Admitting: Family Medicine

## 2017-09-05 NOTE — Telephone Encounter (Signed)
Patient reports she is doing much better with the Nystatin cream.  She was advised that we would not change it and to continue using it until she was completely better.  If anything changes she is to let us know.

## 2017-09-05 NOTE — Telephone Encounter (Signed)
LMTCB ED 

## 2017-09-05 NOTE — Telephone Encounter (Signed)
Pt stated that she was supposed to call and speak with Jiles Garter to give her an update on how the nystatin cream (MYCOSTATIN) was helping. Pt stated that it is helping but she would like [place] to return her call. Please advise. Thanks TNP

## 2017-09-14 DIAGNOSIS — L438 Other lichen planus: Secondary | ICD-10-CM | POA: Diagnosis not present

## 2017-10-17 ENCOUNTER — Ambulatory Visit: Payer: Self-pay

## 2017-10-17 ENCOUNTER — Encounter: Payer: Self-pay | Admitting: Family Medicine

## 2017-10-27 DIAGNOSIS — L438 Other lichen planus: Secondary | ICD-10-CM | POA: Diagnosis not present

## 2017-11-02 ENCOUNTER — Other Ambulatory Visit: Payer: Self-pay | Admitting: Family Medicine

## 2017-11-02 DIAGNOSIS — I10 Essential (primary) hypertension: Secondary | ICD-10-CM

## 2017-11-04 ENCOUNTER — Other Ambulatory Visit: Payer: Self-pay

## 2017-11-04 ENCOUNTER — Encounter: Payer: Self-pay | Admitting: Radiation Oncology

## 2017-11-04 ENCOUNTER — Ambulatory Visit
Admission: RE | Admit: 2017-11-04 | Discharge: 2017-11-04 | Disposition: A | Discharge status: Home / Self Care | Payer: PPO | Source: Ambulatory Visit | Attending: Radiation Oncology | Admitting: Radiation Oncology

## 2017-11-04 VITALS — BP 114/71 | HR 79 | Temp 97.3°F | Resp 16 | Ht 64.0 in | Wt 149.4 lb

## 2017-11-04 DIAGNOSIS — Z923 Personal history of irradiation: Secondary | ICD-10-CM | POA: Insufficient documentation

## 2017-11-04 DIAGNOSIS — Z79811 Long term (current) use of aromatase inhibitors: Secondary | ICD-10-CM | POA: Insufficient documentation

## 2017-11-04 DIAGNOSIS — Z170 Estrogen receptor positive status [ER+]: Secondary | ICD-10-CM | POA: Insufficient documentation

## 2017-11-04 DIAGNOSIS — C50211 Malignant neoplasm of upper-inner quadrant of right female breast: Secondary | ICD-10-CM | POA: Diagnosis not present

## 2017-11-04 DIAGNOSIS — L439 Lichen planus, unspecified: Secondary | ICD-10-CM | POA: Diagnosis not present

## 2017-11-04 NOTE — Progress Notes (Signed)
Radiation Oncology Follow up Note  Name: [name] [name]   Date:   11/04/2017 MRN:  [id number] DOB: Sep 15, 1940    This 77 y.o. female presents to the clinic today for 1 year follow-up status post whole breast radiation to her right breast for ER/PR positive invasive mammary carcinoma.  REFERRING PROVIDER: Jerrol Banana.,*  HPI: patient is a 78 year old female now out 1 year having completed whole breast radiation to her right breast for stage I ER/PR positive invasive Carcinoma. 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  COMPLICATIONS OF TREATMENT: none  FOLLOW UP COMPLIANCE: keeps appointments   PHYSICAL EXAM:  BP 114/71 (BP Location: Left Arm, Patient Position: Sitting)   Pulse 79   Temp (!) 97.3 F (36.3 C) (Tympanic)   Resp 16   Ht 5\' 4"  (1.626 m)   Wt 149 lb 5.8 oz (67.8 kg)   BMI 25.64 kg/m  Right breast has an unusual discoloration with no areas of ulceration or desquamation noted. Changes are consistent with lichen planus. No dominant mass or nodularity is noted in either breast in 2 positions examined. No axillary or supraclavicular adenopathy is identified.Well-developed well-nourished patient in NAD. HEENT reveals PERLA, EOMI, discs not visualized.  Oral cavity is clear. No oral mucosal lesions are identified. Neck is clear without evidence of cervical or supraclavicular adenopathy. Lungs are clear to A&P. Cardiac examination is essentially unremarkable with regular rate and rhythm without murmur rub or thrill. Abdomen is benign with no organomegaly or masses noted. Motor sensory and DTR levels are equal and symmetric in the upper and lower extremities. Cranial nerves II through XII are  grossly intact. Proprioception is intact. No peripheral adenopathy or edema is identified. No motor or sensory levels are noted. Crude visual fields are within normal range.  RADIOLOGY RESULTS: mammograms are reviewed and compatible above-stated findings  PLAN: present time patient is doing well with no evidence of disease. Changes to her breast seemed to be stable with no significant areas of ulceration at this time. I'm please were overall progress. She continues on letrozole without side effect. I have asked to see her back in 1 year for follow-up. She knows to call sooner with any concerns.  I would like to take this opportunity to thank you for allowing me to participate in the care of your patient.Noreene Filbert, MD

## 2017-11-15 ENCOUNTER — Ambulatory Visit (INDEPENDENT_AMBULATORY_CARE_PROVIDER_SITE_OTHER): Payer: PPO | Admitting: Family Medicine

## 2017-11-15 ENCOUNTER — Encounter: Payer: Self-pay | Admitting: Family Medicine

## 2017-11-15 VITALS — BP 128/70 | HR 76 | Temp 98.9°F | Resp 16 | Wt 151.0 lb

## 2017-11-15 DIAGNOSIS — R5383 Other fatigue: Secondary | ICD-10-CM | POA: Diagnosis not present

## 2017-11-15 DIAGNOSIS — I2510 Atherosclerotic heart disease of native coronary artery without angina pectoris: Secondary | ICD-10-CM

## 2017-11-15 DIAGNOSIS — R5381 Other malaise: Secondary | ICD-10-CM | POA: Diagnosis not present

## 2017-11-15 DIAGNOSIS — Z170 Estrogen receptor positive status [ER+]: Secondary | ICD-10-CM | POA: Diagnosis not present

## 2017-11-15 DIAGNOSIS — C50211 Malignant neoplasm of upper-inner quadrant of right female breast: Secondary | ICD-10-CM

## 2017-11-15 NOTE — Progress Notes (Signed)
Patient: [name] [name] Female    DOB: 07/14/1940   77 y.o.   MRN: [id number] Visit Date: 11/15/2017  Today's Provider: Wilhemena Durie, MD   Chief Complaint  Patient presents with  . Fatigue   Subjective:    HPI Patient comes in today c/o excessive fatigue. She reports that her symptoms have gotten worse over the last week. She does feel that she is getting enough rest at night. She has also been checking her BP to see if this could be contributing to her symptoms. So far, all of her BP readings have been WNL. Patient reports that she has had a low Hgb in the past, and feels that this may be her issue.  She lives at home with her husband of 25 years.    Allergies  Allergen Reactions  . Celebrex  [Celecoxib]     Dark stools.  . Penicillins Other (See Comments)    Childhood reaction Has patient had a PCN reaction causing immediate rash, facial/tongue/throat swelling, SOB or lightheadedness with hypotension: Unknown Has patient had a PCN reaction causing severe rash involving mucus membranes or skin necrosis: Unknown Has patient had a PCN reaction that required hospitalization: Unknown Has patient had a PCN reaction occurring within the last 10 years: Unknown If all of the above answers are "NO", then may proceed with Cephalosporin use.   Marland Kitchen Prevacid  [Lansoprazole] Diarrhea     Current Outpatient Medications:  .  amLODipine (NORVASC) 5 MG tablet, TAKE 1 TABLET BY MOUTH EVERY DAY, Disp: 90 tablet, Rfl: 4 .  aspirin EC 81 MG tablet, Take 81 mg by mouth daily., Disp: , Rfl:  .  Clobetasol Prop Emollient Base 0.05 % emollient cream, , Disp: , Rfl:  .  fluocinonide cream (LIDEX) 1.02 %, Apply 1 application topically daily as needed (rash)., Disp: , Rfl:  .  letrozole (FEMARA) 2.5 MG tablet, Take 1 tablet (2.5 mg total) by mouth daily., Disp: 90 tablet, Rfl: 3 .  lisinopril (PRINIVIL,ZESTRIL) 20 MG tablet, TAKE 1 TABLET BY MOUTH EVERY DAY, Disp: , Rfl:  .  nystatin cream  (MYCOSTATIN), Apply 1 application topically 2 (two) times daily., Disp: 30 g, Rfl: 0 .  omeprazole (PRILOSEC) 20 MG capsule, Take 20 mg by mouth daily as needed (indigestion)., Disp: , Rfl:  .  pravastatin (PRAVACHOL) 10 MG tablet, Take 10 mg by mouth daily., Disp: , Rfl:  .  triamcinolone cream (KENALOG) 0.1 %, APPLY TWICE DAILY TO RASH ON RIGHT BREAST UNTIL CLEAR, Disp: , Rfl: 0  Review of Systems  Constitutional: Positive for activity change, appetite change and fatigue. Negative for chills, diaphoresis, fever and unexpected weight change.  HENT: Negative.   Eyes: Negative.   Respiratory: Negative for cough and shortness of breath.   Cardiovascular: Negative for chest pain, palpitations and leg swelling.  Gastrointestinal: Negative.   Endocrine: Negative.   Musculoskeletal: Positive for arthralgias, back pain, gait problem and myalgias.  Allergic/Immunologic: Negative.   Neurological: Positive for light-headedness. Negative for dizziness, syncope and headaches.  Hematological: Negative.   Psychiatric/Behavioral: Negative.     Social History   Tobacco Use  . Smoking status: Former Smoker    Packs/day: 0.25    Years: 10.00    Pack years: 2.50    Types: Cigarettes    Last attempt to quit: 03/06/1998    Years since quitting: 19.7  . Smokeless tobacco: Never Used  Substance Use Topics  . Alcohol use: No  Objective:   BP 128/70 (BP Location: Left Arm, Patient Position: Sitting, Cuff Size: Normal)   Pulse 76   Temp 98.9 F (37.2 C)   Resp 16   Wt 151 lb (68.5 kg)   SpO2 100%   BMI 25.92 kg/m  Vitals:   11/15/17 1453  BP: 128/70  Pulse: 76  Resp: 16  Temp: 98.9 F (37.2 C)  SpO2: 100%  Weight: 151 lb (68.5 kg)     Physical Exam  Constitutional: She is oriented to person, place, and time. She appears well-developed and well-nourished.  HENT:  Head: Normocephalic and atraumatic.  Right Ear: External ear normal.  Left Ear: External ear normal.  Nose: Nose  normal.  Eyes: Conjunctivae are normal. No scleral icterus.  Neck: No thyromegaly present.  Cardiovascular: Normal rate, regular rhythm and normal heart sounds.  Pulmonary/Chest: Effort normal and breath sounds normal.  Abdominal: Soft.  Musculoskeletal: She exhibits no edema.  Neurological: She is alert and oriented to person, place, and time.  Skin: Skin is warm and dry.  Psychiatric: She has a normal mood and affect. Her behavior is normal. Judgment and thought content normal.        Assessment & Plan:     1. Fatigue, unspecified type RTC 2 weeks. - EKG 12-Lead - CBC with Differential/Platelet - Comprehensive metabolic panel - TSH  2. Arteriosclerosis of coronary artery Does not appear to be CAD .  3. Malaise and fatigue   4. Malignant neoplasm of upper-inner quadrant of right breast in female, estrogen receptor positive ([place])  .I have done the exam and reviewed the chart and it is accurate to the best of my knowledge. Development worker, community has been used and  any errors in dictation or transcription are unintentional. Miguel Aschoff M.D. [place], MD  [facility]

## 2017-11-16 LAB — COMPREHENSIVE METABOLIC PANEL
A/G RATIO: 1.6 (ref 1.2–2.2)
ALBUMIN: 3.9 g/dL (ref 3.5–4.8)
ALT: 6 IU/L (ref 0–32)
AST: 12 IU/L (ref 0–40)
Alkaline Phosphatase: 73 IU/L (ref 39–117)
BILIRUBIN TOTAL: 0.3 mg/dL (ref 0.0–1.2)
BUN/Creatinine Ratio: 15 (ref 12–28)
BUN: 14 mg/dL (ref 8–27)
CHLORIDE: 105 mmol/L (ref 96–106)
CO2: 26 mmol/L (ref 20–29)
Calcium: 9.4 mg/dL (ref 8.7–10.3)
Creatinine, Ser: 0.95 mg/dL (ref 0.57–1.00)
GFR, EST AFRICAN AMERICAN: 66 mL/min/{1.73_m2} (ref >59)
GFR, EST NON AFRICAN AMERICAN: 58 mL/min/{1.73_m2} — AB (ref >59)
GLUCOSE: 94 mg/dL (ref 65–99)
Globulin, Total: 2.4 g/dL (ref 1.5–4.5)
Potassium: 4.4 mmol/L (ref 3.5–5.2)
Sodium: 144 mmol/L (ref 134–144)
TOTAL PROTEIN: 6.3 g/dL (ref 6.0–8.5)

## 2017-11-16 LAB — CBC WITH DIFFERENTIAL/PLATELET
BASOS: 1 %
Basophils Absolute: 0.1 10*3/uL (ref 0.0–0.2)
EOS (ABSOLUTE): 0.2 10*3/uL (ref 0.0–0.4)
Eos: 3 %
HEMOGLOBIN: 12.2 g/dL (ref 11.1–15.9)
Hematocrit: 37.5 % (ref 34.0–46.6)
IMMATURE GRANS (ABS): 0 10*3/uL (ref 0.0–0.1)
Immature Granulocytes: 0 %
LYMPHS: 21 %
Lymphocytes Absolute: 1.4 10*3/uL (ref 0.7–3.1)
MCH: 28.8 pg (ref 26.6–33.0)
MCHC: 32.5 g/dL (ref 31.5–35.7)
MCV: 88 fL (ref 79–97)
MONOCYTES: 8 %
Monocytes Absolute: 0.6 10*3/uL (ref 0.1–0.9)
NEUTROS ABS: 4.6 10*3/uL (ref 1.4–7.0)
Neutrophils: 67 %
PLATELETS: 242 10*3/uL (ref 150–450)
RBC: 4.24 x10E6/uL (ref 3.77–5.28)
RDW: 15.6 % — ABNORMAL HIGH (ref 12.3–15.4)
WBC: 6.8 10*3/uL (ref 3.4–10.8)

## 2017-11-16 LAB — TSH: TSH: 1.03 u[IU]/mL (ref 0.450–4.500)

## 2017-11-17 ENCOUNTER — Telehealth: Payer: Self-pay

## 2017-11-17 NOTE — Telephone Encounter (Signed)
-----   Message from Jerrol Banana., MD sent at 11/16/2017  9:21 AM EDT ----- Labs good.

## 2017-11-17 NOTE — Telephone Encounter (Signed)
Left message to call back  

## 2017-11-17 NOTE — Telephone Encounter (Signed)
Pt stated she was returning call and that she was also supposed to call Jiles Garter and request that Jiles Garter return her call. Please advise. Thanks TNP

## 2017-11-18 NOTE — Telephone Encounter (Signed)
Pt returned call ° °teri °

## 2017-11-21 NOTE — Telephone Encounter (Signed)
Advised patient of results.  

## 2017-11-29 ENCOUNTER — Encounter: Payer: Self-pay | Admitting: Family Medicine

## 2017-11-29 ENCOUNTER — Ambulatory Visit (INDEPENDENT_AMBULATORY_CARE_PROVIDER_SITE_OTHER): Payer: PPO | Admitting: Family Medicine

## 2017-11-29 VITALS — BP 132/68 | HR 72 | Resp 16 | Ht 62.0 in | Wt 150.0 lb

## 2017-11-29 DIAGNOSIS — D649 Anemia, unspecified: Secondary | ICD-10-CM | POA: Diagnosis not present

## 2017-11-29 DIAGNOSIS — I2510 Atherosclerotic heart disease of native coronary artery without angina pectoris: Secondary | ICD-10-CM | POA: Diagnosis not present

## 2017-11-29 DIAGNOSIS — Z170 Estrogen receptor positive status [ER+]: Secondary | ICD-10-CM

## 2017-11-29 DIAGNOSIS — E7800 Pure hypercholesterolemia, unspecified: Secondary | ICD-10-CM | POA: Diagnosis not present

## 2017-11-29 DIAGNOSIS — R42 Dizziness and giddiness: Secondary | ICD-10-CM

## 2017-11-29 DIAGNOSIS — C50211 Malignant neoplasm of upper-inner quadrant of right female breast: Secondary | ICD-10-CM | POA: Diagnosis not present

## 2017-11-29 MED ORDER — MECLIZINE HCL 25 MG PO TABS: 25.0000 mg | ORAL_TABLET | Freq: Three times a day (TID) | ORAL | 2 refills | Status: DC | PRN

## 2017-11-29 NOTE — Progress Notes (Signed)
Patient: [name] [name] Female    DOB: 1940-12-21   77 y.o.   MRN: [id number] Visit Date: 11/29/2017  Today's Provider: Wilhemena Durie, MD   Chief Complaint  Patient presents with  . Follow-up   Subjective:    HPI Patient comes in today for a 2 week follow up. She reports that she is feeling a little better. Her labs on 11/15/17 were WNL. She reports that her "spells" a more infrequent.     Allergies  Allergen Reactions  . Celebrex  [Celecoxib]     Dark stools.  . Penicillins Other (See Comments)    Childhood reaction Has patient had a PCN reaction causing immediate rash, facial/tongue/throat swelling, SOB or lightheadedness with hypotension: Unknown Has patient had a PCN reaction causing severe rash involving mucus membranes or skin necrosis: Unknown Has patient had a PCN reaction that required hospitalization: Unknown Has patient had a PCN reaction occurring within the last 10 years: Unknown If all of the above answers are "NO", then may proceed with Cephalosporin use.   Marland Kitchen Prevacid  [Lansoprazole] Diarrhea     Current Outpatient Medications:  .  amLODipine (NORVASC) 5 MG tablet, TAKE 1 TABLET BY MOUTH EVERY DAY, Disp: 90 tablet, Rfl: 4 .  aspirin EC 81 MG tablet, Take 81 mg by mouth daily., Disp: , Rfl:  .  Clobetasol Prop Emollient Base 0.05 % emollient cream, , Disp: , Rfl:  .  fluocinonide cream (LIDEX) 5.91 %, Apply 1 application topically daily as needed (rash)., Disp: , Rfl:  .  letrozole (FEMARA) 2.5 MG tablet, Take 1 tablet (2.5 mg total) by mouth daily., Disp: 90 tablet, Rfl: 3 .  lisinopril (PRINIVIL,ZESTRIL) 20 MG tablet, TAKE 1 TABLET BY MOUTH EVERY DAY, Disp: , Rfl:  .  nystatin cream (MYCOSTATIN), Apply 1 application topically 2 (two) times daily., Disp: 30 g, Rfl: 0 .  omeprazole (PRILOSEC) 20 MG capsule, Take 20 mg by mouth daily as needed (indigestion)., Disp: , Rfl:  .  pravastatin (PRAVACHOL) 10 MG tablet, Take 10 mg by mouth daily., Disp: ,  Rfl:  .  triamcinolone cream (KENALOG) 0.1 %, APPLY TWICE DAILY TO RASH ON RIGHT BREAST UNTIL CLEAR, Disp: , Rfl: 0  Review of Systems  Constitutional: Negative for activity change, chills, diaphoresis, fatigue and fever.  HENT: Negative.   Respiratory: Negative for cough and shortness of breath.   Cardiovascular: Negative for chest pain, palpitations and leg swelling.  Endocrine: Negative for cold intolerance, heat intolerance and polydipsia.  Musculoskeletal: Negative for back pain, joint swelling, myalgias and neck pain.  Allergic/Immunologic: Negative.   Neurological: Positive for dizziness and light-headedness. Negative for syncope and headaches.  Psychiatric/Behavioral: Negative.     Social History   Tobacco Use  . Smoking status: Former Smoker    Packs/day: 0.25    Years: 10.00    Pack years: 2.50    Types: Cigarettes    Last attempt to quit: 03/06/1998    Years since quitting: 19.7  . Smokeless tobacco: Never Used  Substance Use Topics  . Alcohol use: No   Objective:   BP 132/68 (BP Location: Right Arm, Patient Position: Sitting, Cuff Size: Normal)   Pulse 72   Resp 16   Ht 5\' 2"  (1.575 m)   Wt 150 lb (68 kg)   SpO2 97%   BMI 27.44 kg/m  Vitals:   11/29/17 1037  BP: 132/68  Pulse: 72  Resp: 16  SpO2: 97%  Weight:  150 lb (68 kg)  Height: 5\' 2"  (1.575 m)     Physical Exam  Constitutional: She is oriented to person, place, and time. She appears well-developed and well-nourished.  HENT:  Head: Normocephalic and atraumatic.  Right Ear: External ear normal.  Left Ear: External ear normal.  Nose: Nose normal.  Mouth/Throat: Oropharynx is clear and moist.  Eyes: Conjunctivae are normal. No scleral icterus.  Neck: No thyromegaly present.  Cardiovascular: Normal rate, regular rhythm, normal heart sounds and intact distal pulses.  Pulmonary/Chest: Effort normal and breath sounds normal.  Abdominal: Soft.  Musculoskeletal: She exhibits no edema.    Lymphadenopathy:    She has no cervical adenopathy.  Neurological: She is alert and oriented to person, place, and time.  Mild nystagmus to right.  Skin: Skin is warm and dry.  Psychiatric: She has a normal mood and affect. Her behavior is normal. Judgment and thought content normal.        Assessment & Plan:     1. Vertigo  - meclizine (ANTIVERT) 25 MG tablet; Take 1 tablet (25 mg total) by mouth 3 (three) times daily as needed for dizziness.  Dispense: 90 tablet; Refill: 2  2. Atherosclerosis of coronary artery of native heart without angina pectoris, unspecified vessel or lesion type   3. Hypercholesteremia   4. Dizziness   5. Anemia, unspecified type   6. Malignant neoplasm of upper-inner quadrant of right breast in female, estrogen receptor positive ([place])  I have done the exam and reviewed the chart and it is accurate to the best of my knowledge. Development worker, community has been used and  any errors in dictation or transcription are unintentional. Miguel Aschoff M.D. [place], MD  [facility]

## 2017-12-02 ENCOUNTER — Other Ambulatory Visit: Payer: Self-pay | Admitting: *Deleted

## 2017-12-02 MED ORDER — LETROZOLE 2.5 MG PO TABS: 2.5000 mg | ORAL_TABLET | Freq: Every day | ORAL | 0 refills | Status: DC

## 2017-12-05 DIAGNOSIS — H40003 Preglaucoma, unspecified, bilateral: Secondary | ICD-10-CM | POA: Diagnosis not present

## 2017-12-11 ENCOUNTER — Other Ambulatory Visit: Payer: Self-pay

## 2017-12-11 ENCOUNTER — Emergency Department
Admission: EM | Admit: 2017-12-11 | Discharge: 2017-12-11 | Disposition: A | Discharge status: Home / Self Care | Payer: PPO | Attending: Emergency Medicine | Admitting: Emergency Medicine

## 2017-12-11 ENCOUNTER — Emergency Department: Payer: PPO

## 2017-12-11 DIAGNOSIS — R05 Cough: Secondary | ICD-10-CM | POA: Diagnosis not present

## 2017-12-11 DIAGNOSIS — N390 Urinary tract infection, site not specified: Secondary | ICD-10-CM | POA: Insufficient documentation

## 2017-12-11 DIAGNOSIS — R42 Dizziness and giddiness: Secondary | ICD-10-CM

## 2017-12-11 DIAGNOSIS — I2510 Atherosclerotic heart disease of native coronary artery without angina pectoris: Secondary | ICD-10-CM | POA: Diagnosis not present

## 2017-12-11 DIAGNOSIS — Z853 Personal history of malignant neoplasm of breast: Secondary | ICD-10-CM | POA: Insufficient documentation

## 2017-12-11 DIAGNOSIS — I10 Essential (primary) hypertension: Secondary | ICD-10-CM | POA: Diagnosis not present

## 2017-12-11 DIAGNOSIS — Z79899 Other long term (current) drug therapy: Secondary | ICD-10-CM | POA: Diagnosis not present

## 2017-12-11 DIAGNOSIS — Z87891 Personal history of nicotine dependence: Secondary | ICD-10-CM | POA: Insufficient documentation

## 2017-12-11 DIAGNOSIS — R110 Nausea: Secondary | ICD-10-CM | POA: Diagnosis not present

## 2017-12-11 DIAGNOSIS — Z955 Presence of coronary angioplasty implant and graft: Secondary | ICD-10-CM | POA: Diagnosis not present

## 2017-12-11 DIAGNOSIS — R079 Chest pain, unspecified: Secondary | ICD-10-CM | POA: Diagnosis not present

## 2017-12-11 DIAGNOSIS — I252 Old myocardial infarction: Secondary | ICD-10-CM | POA: Insufficient documentation

## 2017-12-11 DIAGNOSIS — Z7982 Long term (current) use of aspirin: Secondary | ICD-10-CM | POA: Insufficient documentation

## 2017-12-11 DIAGNOSIS — R319 Hematuria, unspecified: Secondary | ICD-10-CM | POA: Insufficient documentation

## 2017-12-11 LAB — CBC
HEMATOCRIT: 38.6 % (ref 35.0–47.0)
HEMOGLOBIN: 12.7 g/dL (ref 12.0–16.0)
MCH: 29.7 pg (ref 26.0–34.0)
MCHC: 33.1 g/dL (ref 32.0–36.0)
MCV: 89.9 fL (ref 80.0–100.0)
Platelets: 239 10*3/uL (ref 150–440)
RBC: 4.29 MIL/uL (ref 3.80–5.20)
RDW: 15.6 % — ABNORMAL HIGH (ref 11.5–14.5)
WBC: 7.3 10*3/uL (ref 3.6–11.0)

## 2017-12-11 LAB — URINALYSIS, COMPLETE (UACMP) WITH MICROSCOPIC
Bilirubin Urine: NEGATIVE
Glucose, UA: NEGATIVE mg/dL
Hgb urine dipstick: NEGATIVE
Ketones, ur: NEGATIVE mg/dL
Nitrite: NEGATIVE
PROTEIN: NEGATIVE mg/dL
Specific Gravity, Urine: 1.023 (ref 1.005–1.030)
pH: 5 (ref 5.0–8.0)

## 2017-12-11 LAB — COMPREHENSIVE METABOLIC PANEL
ALBUMIN: 4 g/dL (ref 3.5–5.0)
ALT: 13 U/L (ref 0–44)
AST: 20 U/L (ref 15–41)
Alkaline Phosphatase: 64 U/L (ref 38–126)
Anion gap: 6 (ref 5–15)
BUN: 21 mg/dL (ref 8–23)
CALCIUM: 9 mg/dL (ref 8.9–10.3)
CHLORIDE: 109 mmol/L (ref 98–111)
CO2: 28 mmol/L (ref 22–32)
Creatinine, Ser: 1.1 mg/dL — ABNORMAL HIGH (ref 0.44–1.00)
GFR calc Af Amer: 54 mL/min — ABNORMAL LOW (ref >60)
GFR calc non Af Amer: 46 mL/min — ABNORMAL LOW (ref >60)
GLUCOSE: 110 mg/dL — AB (ref 70–99)
Potassium: 3.9 mmol/L (ref 3.5–5.1)
SODIUM: 143 mmol/L (ref 135–145)
Total Bilirubin: 0.8 mg/dL (ref 0.3–1.2)
Total Protein: 7.1 g/dL (ref 6.5–8.1)

## 2017-12-11 LAB — LIPASE, BLOOD: LIPASE: 22 U/L (ref 11–51)

## 2017-12-11 MED ORDER — IPRATROPIUM-ALBUTEROL 0.5-2.5 (3) MG/3ML IN SOLN
3.0000 mL | Freq: Once | RESPIRATORY_TRACT | Status: AC
  Administered 2017-12-11: 3 mL via RESPIRATORY_TRACT
  Filled 2017-12-11: qty 3

## 2017-12-11 MED ORDER — SUCRALFATE 1 G PO TABS: 1.0000 g | ORAL_TABLET | Freq: Four times a day (QID) | ORAL | 0 refills | Status: DC

## 2017-12-11 MED ORDER — LIDOCAINE VISCOUS HCL 2 % MT SOLN
15.0000 mL | Freq: Once | OROMUCOSAL | Status: AC
  Administered 2017-12-11: 15 mL via OROMUCOSAL
  Filled 2017-12-11: qty 15

## 2017-12-11 MED ORDER — CEPHALEXIN 500 MG PO CAPS: 500.0000 mg | ORAL_CAPSULE | Freq: Three times a day (TID) | ORAL | 0 refills | Status: AC

## 2017-12-11 NOTE — Discharge Instructions (Signed)
Please continue follow-up with Dr. Rosanna Randy.  Dr. Archie Balboa wrote you a prescription for Carafate 1 4 times a day.  Please be sure to take that 2 hours before or after any other medication because Carafate will act like a sponge and stop up any other medication.  I have also given you a prescription for Keflex antibiotic 1 pill 3 times a day for the very mild urinary tract infection that  you have.

## 2017-12-11 NOTE — ED Provider Notes (Signed)
[facility] Emergency Department Provider Note  ____________________________________________   I have reviewed the triage vital signs and the nursing notes.   HISTORY  Chief Complaint Nausea and Emesis   History limited by: Not Limited   HPI [name] [name] is a 77 y.o. female who presents to the emergency department today because of which she initially described as nausea however in discussion sounds more like congestion.  Patient states that she has been coughing up thick white phlegm-like material.  Does not sound like she has had any actual emesis.  She has not had any abdominal pain although did have some brief left-sided chest pain earlier today.  Patient denies any fevers or chills.  She denies similar symptoms in the past.  She denies any recent sick contacts.  Per medical record review patient has a history of MI, GERD, HTN, HLD.   Past Medical History:  Diagnosis Date  . Cancer ([place]) 07/23/2016   T1b, N0; ER/PR+, 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 Mucin noted on biopsy, not on wide excision.  . Coronary artery disease   . GERD (gastroesophageal reflux disease)   . Hyperlipidemia   . Hypertension   . Lichen planus 62/22/9798   Right breast in field of whole breast radiation. Dr. Kellie Moor DX by punch bioppsy  . Lichen planus    right breast   . MI (myocardial infarction) ([place])    2015  . Personal history of radiation therapy 09/2016   RIGHT lumpectomy w/ radiation    Patient Active Problem List   Diagnosis Date Noted  . Lichen planus 92/01/9416  . Malignant neoplasm of upper-inner quadrant of right breast in female, estrogen receptor positive ([place]) 07/28/2016  . Gastric ulcer requiring drug therapy, chronic 01/23/2016  . MI (mitral incompetence) 07/22/2015  . Combined fat and carbohydrate induced hyperlipemia 12/26/2014  . UTI (lower urinary tract infection) 08/18/2014  . Blurry vision 08/18/2014  . Allergic rhinitis  07/12/2014  . Absolute anemia 07/12/2014  . Baker's cyst of knee 07/12/2014  . Atherosclerosis of coronary artery 07/12/2014  . CAFL (chronic airflow limitation) ([place]) 07/12/2014  . Dizziness 07/12/2014  . Essential (primary) hypertension 07/12/2014  . Acid reflux 07/12/2014  . 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 History of colon polyps 07/12/2014  . Hypercholesteremia 07/12/2014  . Malaise and fatigue 07/12/2014  . Heart attack ([place]) 07/12/2014  . Muscle ache 07/12/2014  . Arthritis, degenerative 07/12/2014  . Bradycardia 08/23/2013  . Arteriosclerosis of coronary artery 08/17/2013  . Diabetes ([place]) 08/17/2013  . Diabetes mellitus ([place]) 08/17/2013  . Peripheral vascular disease ([place]) 08/17/2013    Past Surgical History:  Procedure Laterality Date  . ABDOMINAL HYSTERECTOMY    . APPENDECTOMY    . BREAST BIOPSY Right 07/23/2016   INVASIVE MAMMARY CARCINOMA WITH AREAS OF EXTRACELLULAR MUCIN  . BREAST EXCISIONAL BIOPSY     INVASIVE MUCINOUS MAMMARY CARCINOMA.   Marland Kitchen BREAST LUMPECTOMY Right 08/20/2016   INVASIVE MUCINOUS MAMMARY CARCINOMA. /Grade 2   . COLONOSCOPY  2016  . CORONARY ANGIOPLASTY WITH STENT PLACEMENT    . CYSTOSCOPY    . PARTIAL MASTECTOMY WITH AXILLARY SENTINEL LYMPH NODE BIOPSY Right 08/09/2016   Procedure: PARTIAL MASTECTOMY WITH AXILLARY SENTINEL LYMPH NODE BIOPSY;  Surgeon: Robert Bellow, MD;  Location: [facility] ORS;  Service: General;  Laterality: Right;  . TONSILLECTOMY      Prior to Admission medications   Medication Sig Start Date End Date Taking? Authorizing Provider  amLODipine (NORVASC) 5 MG tablet TAKE 1  TABLET BY MOUTH EVERY DAY 11/02/17   Jerrol Banana., MD  aspirin EC 81 MG tablet Take 81 mg by mouth daily.    [provider]  Clobetasol Prop Emollient Base 0.05 % emollient cream  05/03/17   [provider]  fluocinonide cream (LIDEX) 7.85 % Apply 1 application topically daily as needed (rash).    [provider]   letrozole (FEMARA) 2.5 MG tablet Take 1 tablet (2.5 mg total) by mouth daily. 12/02/17   Lloyd Huger, MD  lisinopril (PRINIVIL,ZESTRIL) 20 MG tablet TAKE 1 TABLET BY MOUTH EVERY DAY 10/12/16   [provider]  meclizine (ANTIVERT) 25 MG tablet Take 1 tablet (25 mg total) by mouth 3 (three) times daily as needed for dizziness. 11/29/17   Jerrol Banana., MD  nystatin cream (MYCOSTATIN) Apply 1 application topically 2 (two) times daily. 08/30/17   Jerrol Banana., MD  omeprazole (PRILOSEC) 20 MG capsule Take 20 mg by mouth daily as needed (indigestion).    [provider]  pravastatin (PRAVACHOL) 10 MG tablet Take 10 mg by mouth daily.    [provider]  triamcinolone cream (KENALOG) 0.1 % APPLY TWICE DAILY TO RASH ON RIGHT BREAST UNTIL CLEAR 04/21/17   [provider]    Allergies Celebrex  [celecoxib]; Penicillins; and Prevacid  [lansoprazole]  Family History  Problem Relation Age of Onset  . Hyperlipidemia Mother   . Allergies Mother   . Cerebral aneurysm Mother        cause of death at age 4  . Heart disease Father        Fatal MI ag 79  . Cancer Brother        lung cancer  . Hyperlipidemia Brother   . Colonic polyp Brother   . Healthy Son   . Cancer - Lung Brother        colon  . Healthy Son   . Breast cancer Neg Hx     Social History Social History   Tobacco Use  . Smoking status: Former Smoker    Packs/day: 0.25    Years: 10.00    Pack years: 2.50    Types: Cigarettes    Last attempt to quit: 03/06/1998    Years since quitting: 19.7  . Smokeless tobacco: Never Used  Substance Use Topics  . Alcohol use: No  . Drug use: No    Review of Systems Constitutional: No fever/chills Eyes: No visual changes. ENT: No sore throat. Cardiovascular: Positive for chest pain. Respiratory: Denies shortness of breath. Positive for cough. Gastrointestinal: No abdominal pain.  Positive for nausea.  Genitourinary: Negative  for dysuria. Musculoskeletal: Negative for back pain. Skin: Negative for rash. Neurological: Positive for dizziness.  ____________________________________________   PHYSICAL EXAM:  VITAL SIGNS: ED Triage Vitals  Enc Vitals Group     BP 12/11/17 1827 130/75     Pulse Rate 12/11/17 1827 86     Resp 12/11/17 1827 18     Temp 12/11/17 1827 98.4 F (36.9 C)     Temp src --      SpO2 12/11/17 1827 99 %     Weight 12/11/17 1825 152 lb (68.9 kg)     Height 12/11/17 1825 5\' 5"  (1.651 m)     Head Circumference --      Peak Flow --      Pain Score 12/11/17 1825 0   Constitutional: Alert and oriented.  Eyes: Conjunctivae are normal.  ENT  Head: Normocephalic and atraumatic.      Nose: No congestion/rhinnorhea.      Mouth/Throat: Mucous membranes are moist.      Neck: No stridor. Hematological/Lymphatic/Immunilogical: No cervical lymphadenopathy. Cardiovascular: Normal rate, regular rhythm.  No murmurs, rubs, or gallops. Respiratory: Normal respiratory effort without tachypnea nor retractions. Breath sounds are clear and equal bilaterally. No wheezes/rales/rhonchi. Gastrointestinal: Soft and non tender. No rebound. No guarding.  Genitourinary: Deferred Musculoskeletal: Normal range of motion in all extremities. No lower extremity edema. Neurologic:  Normal speech and language. No gross focal neurologic deficits are appreciated.  Skin:  Skin is warm, dry and intact. No rash noted. Psychiatric: Mood and affect are normal. Speech and behavior are normal. Patient exhibits appropriate insight and judgment.  ____________________________________________    LABS (pertinent positives/negatives)  Lipase 22 CBC wbc 7.3, hgb 12.7, plt 239 CMP wnl except glu 110, cr 1.10 UA pending at time of sign out ____________________________________________   EKG  None  ____________________________________________    RADIOLOGY  CXR No acute  findings  ____________________________________________   PROCEDURES  Procedures  ____________________________________________   INITIAL IMPRESSION / ASSESSMENT AND PLAN / ED COURSE  Pertinent labs & imaging results that were available during my care of the patient were reviewed by me and considered in my medical decision making (see chart for details).   Patient presented to the emergency department today with collection of somewhat vague symptoms.  Patient did complain of some cough and is bringing up some clear phlegm.  Checks x-ray however without any acute process.  Patient also complaining of some dizziness and nausea.  Patient was given a couple medications without any significant relief here in the emergency department.  Awaiting urine at time of signout.   ____________________________________________   FINAL CLINICAL IMPRESSION(S) / ED DIAGNOSES  Cough Nausea Dizziness  Note: This dictation was prepared with Dragon dictation. Any transcriptional errors that result from this process are unintentional     Nance Pear, MD 12/12/17 1540

## 2017-12-11 NOTE — ED Notes (Signed)
Pt states that it feels like something is in her throat. Pt breathing without difficulty.

## 2017-12-11 NOTE — ED Triage Notes (Signed)
Pt c/o n/v that began today. Denies abd pain.

## 2017-12-27 ENCOUNTER — Ambulatory Visit: Payer: PPO

## 2017-12-27 ENCOUNTER — Encounter: Payer: PPO | Admitting: Family Medicine

## 2017-12-27 DIAGNOSIS — H5340 Unspecified visual field defects: Secondary | ICD-10-CM | POA: Diagnosis not present

## 2017-12-27 DIAGNOSIS — H40003 Preglaucoma, unspecified, bilateral: Secondary | ICD-10-CM | POA: Diagnosis not present

## 2017-12-27 LAB — HM DIABETES EYE EXAM

## 2017-12-28 DIAGNOSIS — M1711 Unilateral primary osteoarthritis, right knee: Secondary | ICD-10-CM | POA: Diagnosis not present

## 2018-01-04 ENCOUNTER — Telehealth: Payer: Self-pay | Admitting: Family Medicine

## 2018-01-04 NOTE — Telephone Encounter (Signed)
I don't see anything scanned into [employer].  Have you seen any records?  Thanks,   -Mickel Baas

## 2018-01-04 NOTE — Telephone Encounter (Signed)
Pt wanting to know if the eye doctor has sent over her records/notes about the tests recently done.  Please advise.  Thanks, [employer]

## 2018-01-04 NOTE — Telephone Encounter (Signed)
I do not know--sorry.

## 2018-01-05 NOTE — Telephone Encounter (Signed)
[employer].    Patient said they sent [place] the report last week.  According to the patient they want [place] to order a CT.  Will watch for report and if not received byt he end of the week will call them next week.

## 2018-01-06 ENCOUNTER — Other Ambulatory Visit: Payer: Self-pay | Admitting: Oncology

## 2018-01-08 NOTE — Progress Notes (Signed)
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  INTERVAL HISTORY: Patient returns to clinic today for routine six-month evaluation.  She continues to tolerate letrozole well without significant side effects.  She has had some recent episodes of dizziness that is actively being worked up by cardiology.  She otherwise feels well and is asymptomatic.  She has no neurologic complaints. She denies any recent fevers or illnesses. She has a good appetite and denies weight loss. She has no chest pain or shortness of breath. She denies any nausea, vomiting, constipation, or diarrhea. She has no urinary complaints.  Patient feels at her baseline offers no specific complaints today.  REVIEW OF SYSTEMS:   Review of Systems  Constitutional: Negative.  Negative for fever, malaise/fatigue and weight loss.  Respiratory: Negative.  Negative for cough and shortness of breath.   Cardiovascular: Negative.  Negative for chest pain and leg swelling.  Gastrointestinal: Negative.  Negative for abdominal pain and constipation.  Genitourinary: Negative.  Negative for dysuria.  Musculoskeletal: Negative.  Negative for back pain.  Skin: Negative.  Negative for rash.  Neurological: Positive for dizziness. Negative for sensory change, focal weakness and weakness.  Psychiatric/Behavioral: Negative.  The patient is not nervous/anxious.     As per  HPI. Otherwise, a complete review of systems is negative.  PAST MEDICAL HISTORY: Past Medical History:  Diagnosis Date  . Cancer ([place]) 07/23/2016   T1b, N0; ER/PR+, 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 Mucin noted on biopsy, not on wide excision.  . Coronary artery disease   . GERD (gastroesophageal reflux disease)   . Hyperlipidemia   . Hypertension   . Lichen planus 00/93/8182   Right breast in field of whole breast radiation. Dr. Kellie Moor DX by punch bioppsy  . Lichen planus    right breast   . MI (myocardial infarction) ([place])    2015  . Personal history of radiation therapy 09/2016   RIGHT lumpectomy w/ radiation    PAST SURGICAL HISTORY: Past Surgical History:  Procedure Laterality Date  . ABDOMINAL HYSTERECTOMY    . APPENDECTOMY    . BREAST BIOPSY Right 07/23/2016   INVASIVE MAMMARY CARCINOMA WITH AREAS OF EXTRACELLULAR MUCIN  . BREAST EXCISIONAL BIOPSY     INVASIVE MUCINOUS MAMMARY CARCINOMA.   Marland Kitchen BREAST LUMPECTOMY Right 08/20/2016   INVASIVE MUCINOUS MAMMARY CARCINOMA. /Grade 2   . COLONOSCOPY  2016  . CORONARY ANGIOPLASTY WITH STENT PLACEMENT    . CYSTOSCOPY    . PARTIAL MASTECTOMY WITH AXILLARY SENTINEL LYMPH NODE BIOPSY Right 08/09/2016   Procedure: PARTIAL MASTECTOMY WITH AXILLARY SENTINEL LYMPH NODE BIOPSY;  Surgeon: Robert Bellow, MD;  Location: [facility] ORS;  Service: General;  Laterality: Right;  . TONSILLECTOMY      FAMILY HISTORY: Family History  Problem Relation Age of Onset  . Hyperlipidemia Mother   . Allergies Mother   . Cerebral aneurysm Mother        cause of death at age 86  . Heart disease Father  Fatal MI ag 108  . Cancer Brother        lung cancer  . Hyperlipidemia Brother   . Colonic polyp Brother   . Healthy Son   . Cancer - Lung Brother        colon  . Healthy Son   . Breast cancer Neg Hx     ADVANCED DIRECTIVES (Y/N):  N  HEALTH MAINTENANCE: Social History   Tobacco Use  . Smoking status: Former Smoker     Packs/day: 0.25    Years: 10.00    Pack years: 2.50    Types: Cigarettes    Last attempt to quit: 03/06/1998    Years since quitting: 19.8  . Smokeless tobacco: Never Used  Substance Use Topics  . Alcohol use: No  . Drug use: No     Colonoscopy:  PAP:  Bone density:  Lipid panel:  Allergies  Allergen Reactions  . Celebrex  [Celecoxib]     Dark stools.  . Penicillins Other (See Comments)    Childhood reaction Has patient had a PCN reaction causing immediate rash, facial/tongue/throat swelling, SOB or lightheadedness with hypotension: Unknown Has patient had a PCN reaction causing severe rash involving mucus membranes or skin necrosis: Unknown Has patient had a PCN reaction that required hospitalization: Unknown Has patient had a PCN reaction occurring within the last 10 years: Unknown If all of the above answers are "NO", then may proceed with Cephalosporin use.   Marland Kitchen Prevacid [Lansoprazole] Diarrhea    Current Outpatient Medications  Medication Sig Dispense Refill  . amLODipine (NORVASC) 5 MG tablet TAKE 1 TABLET BY MOUTH EVERY DAY 90 tablet 4  . aspirin EC 81 MG tablet Take 81 mg by mouth daily.    . Clobetasol Prop Emollient Base 0.05 % emollient cream     . letrozole (FEMARA) 2.5 MG tablet Take 1 tablet by mouth daily 90 tablet 3  . lisinopril (PRINIVIL,ZESTRIL) 20 MG tablet TAKE 1 TABLET BY MOUTH EVERY DAY    . pravastatin (PRAVACHOL) 10 MG tablet Take 10 mg by mouth daily.    Marland Kitchen triamcinolone cream (KENALOG) 0.1 % APPLY TWICE DAILY TO RASH ON RIGHT BREAST UNTIL CLEAR  0  . nystatin cream (MYCOSTATIN) Apply 1 application topically 2 (two) times daily. (Patient not taking: Reported on 01/11/2018) 30 g 0  . omeprazole (PRILOSEC) 20 MG capsule Take 20 mg by mouth daily as needed (indigestion).    . sucralfate (CARAFATE) 1 g tablet Take 1 tablet (1 g total) by mouth 4 (four) times daily. (Patient not taking: Reported on 01/11/2018) 60 tablet 0   No current  facility-administered medications for this visit.     OBJECTIVE: Vitals:   01/11/18 1125  BP: 139/81  Pulse: 83  Resp: 18  Temp: (!) 95.8 F (35.4 C)     Body mass index is 24.98 kg/m.    ECOG FS:0 - Asymptomatic  General: Well-developed, well-nourished, no acute distress. Eyes: Pink conjunctiva, anicteric sclera. HEENT: Normocephalic, moist mucous membranes. Breast: Patient declined breast exam today. Lungs: Clear to auscultation bilaterally. Heart: Regular rate and rhythm. No rubs, murmurs, or gallops. Abdomen: Soft, nontender, nondistended. No organomegaly noted, normoactive bowel sounds. Musculoskeletal: No edema, cyanosis, or clubbing. Neuro: Alert, answering all questions appropriately. Cranial nerves grossly intact. Skin: No rashes or petechiae noted. Psych: Normal affect.  LAB RESULTS:  Lab Results  Component Value Date   NA 140 01/11/2018   K 4.0 01/11/2018   CL 105 01/11/2018   CO2 27  01/11/2018   GLUCOSE 106 (H) 01/11/2018   BUN 25 (H) 01/11/2018   CREATININE 1.08 (H) 01/11/2018   CALCIUM 9.0 01/11/2018   PROT 7.1 12/11/2017   ALBUMIN 4.0 12/11/2017   AST 20 12/11/2017   ALT 13 12/11/2017   ALKPHOS 64 12/11/2017   BILITOT 0.8 12/11/2017   GFRNONAA 48 (L) 01/11/2018   GFRAA 55 (L) 01/11/2018    Lab Results  Component Value Date   WBC 10.9 (H) 01/11/2018   NEUTROABS 4.6 11/15/2017   HGB 13.0 01/11/2018   HCT 41.3 01/11/2018   MCV 94.1 01/11/2018   PLT 242 01/11/2018     STUDIES: Ct Angio Neck W And/or Wo Contrast  Result Date: 01/11/2018 CLINICAL DATA:  77 y/o  F; syncopal episode. EXAM: CT ANGIOGRAPHY NECK TECHNIQUE: Multidetector CT imaging of the neck was performed using the standard protocol during bolus administration of intravenous contrast. Multiplanar CT image reconstructions and MIPs were obtained to evaluate the vascular anatomy. Carotid stenosis measurements (when applicable) are obtained utilizing NASCET criteria, using the distal  internal carotid diameter as the denominator. CONTRAST:  36m ISOVUE-370 IOPAMIDOL (ISOVUE-370) INJECTION 76% COMPARISON:  None. FINDINGS: Aortic arch: Standard branching. Imaged portion shows no evidence of aneurysm or dissection. No significant stenosis of the major arch vessel origins. Aortic atherosclerosis with mixed plaque. Right carotid system: No evidence of dissection, stenosis (50% or greater) or occlusion. Mixed plaque of the carotid bifurcation with mild less than 50% proximal ICA stenosis. Left carotid system: No evidence of dissection, stenosis (50% or greater) or occlusion. Mixed plaque of the carotid bifurcation without significant stenosis. Vertebral arteries: Left dominant. No evidence of dissection, stenosis (50% or greater) or occlusion. Skeleton: Moderate spondylosis of the cervical spine with multilevel disc and facet degenerative changes. No significant bony canal stenosis. C7-T1 grade 1 anterolisthesis uncovertebral and facet hypertrophy results in bilateral neural foraminal stenosis from C3-7. Other neck: 9 mm nodule within the left lobe of thyroid. Upper chest: Negative. IMPRESSION: 1. Patent carotid and vertebral arteries. No dissection, aneurysm, or significant stenosis by NASCET criteria. 2. Moderate atherosclerosis of the aorta and carotid bifurcations with mixed plaque. Mild right proximal ICA stenosis. 3. Moderate cervical spondylosis. Electronically Signed   By: LKristine GarbeM.D.   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  PLAN:    1. 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 Given the size of the tumor, no Oncotype DX was ordered and no adjuvant chemotherapy was recommended. She  completed adjuvant XRT in August 2018.  Continue letrozole for a total of 5 years completing in August 2023. Her most recent mammogram on May 02, 2017 was reported as BI-RADS 2, repeat in February 2020.  Return to clinic in 6 months for routine evaluation.   2.  Osteopenia: Bone mineral density completed on January 24, 2017 revealed a T score of -1.8.  Continue calcium and vitamin D supplementation.  Repeat in the next 1 to 2 weeks. 3.  Dizziness: Continue monitoring and work-up per cardiology.  Patient expressed understanding and was in agreement with this plan. She also understands that She can call clinic at any time with any questions, concerns, or complaints.   Cancer Staging Malignant neoplasm of upper-inner quadrant of right breast in  female, estrogen receptor positive ([place]) Staging form: Breast, AJCC 8th Edition - Pathologic stage from 08/30/2016: Stage IA (pT1b, pN0, cM0, G2, ER: Positive, PR: Positive, HER2: Negative) - Signed by Lloyd Huger, MD on 08/30/2016   Lloyd Huger, MD   01/12/2018 6:49 AM

## 2018-01-10 DIAGNOSIS — I10 Essential (primary) hypertension: Secondary | ICD-10-CM | POA: Diagnosis not present

## 2018-01-10 DIAGNOSIS — I340 Nonrheumatic mitral (valve) insufficiency: Secondary | ICD-10-CM | POA: Diagnosis not present

## 2018-01-10 DIAGNOSIS — I2510 Atherosclerotic heart disease of native coronary artery without angina pectoris: Secondary | ICD-10-CM | POA: Diagnosis not present

## 2018-01-10 DIAGNOSIS — I739 Peripheral vascular disease, unspecified: Secondary | ICD-10-CM | POA: Diagnosis not present

## 2018-01-10 DIAGNOSIS — R42 Dizziness and giddiness: Secondary | ICD-10-CM | POA: Diagnosis not present

## 2018-01-11 ENCOUNTER — Inpatient Hospital Stay: Payer: PPO | Attending: Oncology | Admitting: Oncology

## 2018-01-11 ENCOUNTER — Encounter: Payer: Self-pay | Admitting: Emergency Medicine

## 2018-01-11 ENCOUNTER — Other Ambulatory Visit: Payer: Self-pay

## 2018-01-11 ENCOUNTER — Emergency Department: Payer: PPO

## 2018-01-11 ENCOUNTER — Emergency Department
Admission: EM | Admit: 2018-01-11 | Discharge: 2018-01-11 | Disposition: A | Discharge status: Home / Self Care | Payer: PPO | Attending: Emergency Medicine | Admitting: Emergency Medicine

## 2018-01-11 VITALS — BP 139/81 | HR 83 | Temp 95.8°F | Resp 18 | Wt 150.1 lb

## 2018-01-11 DIAGNOSIS — M8580 Other specified disorders of bone density and structure, unspecified site: Secondary | ICD-10-CM | POA: Diagnosis not present

## 2018-01-11 DIAGNOSIS — Z170 Estrogen receptor positive status [ER+]: Secondary | ICD-10-CM

## 2018-01-11 DIAGNOSIS — C50211 Malignant neoplasm of upper-inner quadrant of right female breast: Secondary | ICD-10-CM

## 2018-01-11 DIAGNOSIS — E119 Type 2 diabetes mellitus without complications: Secondary | ICD-10-CM | POA: Diagnosis not present

## 2018-01-11 DIAGNOSIS — I2510 Atherosclerotic heart disease of native coronary artery without angina pectoris: Secondary | ICD-10-CM | POA: Insufficient documentation

## 2018-01-11 DIAGNOSIS — Z7982 Long term (current) use of aspirin: Secondary | ICD-10-CM | POA: Insufficient documentation

## 2018-01-11 DIAGNOSIS — R55 Syncope and collapse: Secondary | ICD-10-CM | POA: Insufficient documentation

## 2018-01-11 DIAGNOSIS — Z955 Presence of coronary angioplasty implant and graft: Secondary | ICD-10-CM | POA: Insufficient documentation

## 2018-01-11 DIAGNOSIS — R42 Dizziness and giddiness: Secondary | ICD-10-CM

## 2018-01-11 DIAGNOSIS — Z79811 Long term (current) use of aromatase inhibitors: Secondary | ICD-10-CM | POA: Diagnosis not present

## 2018-01-11 DIAGNOSIS — Z79899 Other long term (current) drug therapy: Secondary | ICD-10-CM | POA: Diagnosis not present

## 2018-01-11 DIAGNOSIS — Z923 Personal history of irradiation: Secondary | ICD-10-CM | POA: Diagnosis not present

## 2018-01-11 DIAGNOSIS — I252 Old myocardial infarction: Secondary | ICD-10-CM | POA: Insufficient documentation

## 2018-01-11 DIAGNOSIS — I10 Essential (primary) hypertension: Secondary | ICD-10-CM | POA: Diagnosis not present

## 2018-01-11 DIAGNOSIS — I6521 Occlusion and stenosis of right carotid artery: Secondary | ICD-10-CM | POA: Diagnosis not present

## 2018-01-11 DIAGNOSIS — Z87891 Personal history of nicotine dependence: Secondary | ICD-10-CM | POA: Insufficient documentation

## 2018-01-11 DIAGNOSIS — Z853 Personal history of malignant neoplasm of breast: Secondary | ICD-10-CM | POA: Insufficient documentation

## 2018-01-11 LAB — CBC
HCT: 41.3 % (ref 36.0–46.0)
Hemoglobin: 13 g/dL (ref 12.0–15.0)
MCH: 29.6 pg (ref 26.0–34.0)
MCHC: 31.5 g/dL (ref 30.0–36.0)
MCV: 94.1 fL (ref 80.0–100.0)
Platelets: 242 10*3/uL (ref 150–400)
RBC: 4.39 MIL/uL (ref 3.87–5.11)
RDW: 14.6 % (ref 11.5–15.5)
WBC: 10.9 10*3/uL — ABNORMAL HIGH (ref 4.0–10.5)
nRBC: 0 % (ref 0.0–0.2)

## 2018-01-11 LAB — URINALYSIS, COMPLETE (UACMP) WITH MICROSCOPIC
BACTERIA UA: NONE SEEN
Bilirubin Urine: NEGATIVE
Glucose, UA: NEGATIVE mg/dL
Hgb urine dipstick: NEGATIVE
KETONES UR: NEGATIVE mg/dL
Nitrite: NEGATIVE
PH: 5 (ref 5.0–8.0)
PROTEIN: NEGATIVE mg/dL
Specific Gravity, Urine: 1.023 (ref 1.005–1.030)

## 2018-01-11 LAB — BASIC METABOLIC PANEL
Anion gap: 8 (ref 5–15)
BUN: 25 mg/dL — ABNORMAL HIGH (ref 8–23)
CO2: 27 mmol/L (ref 22–32)
Calcium: 9 mg/dL (ref 8.9–10.3)
Chloride: 105 mmol/L (ref 98–111)
Creatinine, Ser: 1.08 mg/dL — ABNORMAL HIGH (ref 0.44–1.00)
GFR calc Af Amer: 55 mL/min — ABNORMAL LOW (ref >60)
GFR calc non Af Amer: 48 mL/min — ABNORMAL LOW (ref >60)
Glucose, Bld: 106 mg/dL — ABNORMAL HIGH (ref 70–99)
Potassium: 4 mmol/L (ref 3.5–5.1)
Sodium: 140 mmol/L (ref 135–145)

## 2018-01-11 LAB — TROPONIN I

## 2018-01-11 MED ORDER — IOPAMIDOL (ISOVUE-370) INJECTION 76%
75.0000 mL | Freq: Once | INTRAVENOUS | Status: AC | PRN
  Administered 2018-01-11: 75 mL via INTRAVENOUS

## 2018-01-11 NOTE — Discharge Instructions (Addendum)
As we discussed please drink plenty fluids of the next several days.  Please follow-up with cardiology by calling the number provided to arrange a follow-up appointment.  Return to the emergency department for any further syncopal episodes/passing out, development of any chest pain, trouble breathing, or any other symptom personally concerning to yourself.

## 2018-01-11 NOTE — Progress Notes (Signed)
Here for follow up. Overall stated " feeling good"  stated saw Dr  Nehemiah Massed and he plans to do cardiac work up per pt -pt c/o vertigo

## 2018-01-11 NOTE — ED Provider Notes (Signed)
[facility] Emergency Department Provider Note  Time seen: 8:43 PM  I have reviewed the triage vital signs and the nursing notes.   HISTORY  Chief Complaint Loss of Consciousness    HPI [name] [name] is a 77 y.o. female with a past medical history of breast cancer, CAD, gastric reflux, hypertension, hyperlipidemia, prior MI several years ago with stent placed, presents to the emergency department after syncopal episode.  According to the patient she was at her hairdresser getting her hair done.  She was sitting under the hair dryer when she had a syncopal event.  Per report patient did not wake up into the fire department arrived and laid the patient down on the floor and she awoke.  Upon arrival to the emergency department patient is awake alert oriented, has no complaints at this time.  Currently eating food appears comfortable.  Denies any chest pain or shortness of breath at any point.  Denies any nausea or vomiting.  No history of sickly in the past.  Patient does state over the past 3 to 4 months she has been experiencing some lightheadedness when she turns her head sharply to the right or left.  Has been seeing her cardiologist, Dr. Nehemiah Massed for the same.   Past Medical History:  Diagnosis Date  . Cancer ([place]) 07/23/2016   T1b, N0; ER/PR+, 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 Mucin noted on biopsy, not on wide excision.  . Coronary artery disease   . GERD (gastroesophageal reflux disease)   . Hyperlipidemia   . Hypertension   . Lichen planus 09/32/3557   Right breast in field of whole breast radiation. Dr. Kellie Moor DX by punch bioppsy  . Lichen planus    right breast   . MI (myocardial infarction) ([place])    2015  . Personal history of radiation therapy 09/2016   RIGHT lumpectomy w/ radiation    Patient Active Problem List   Diagnosis Date Noted  . Lichen planus 32/20/2542  . Malignant neoplasm of upper-inner quadrant of right breast  in female, estrogen receptor positive ([place]) 07/28/2016  . Gastric ulcer requiring drug therapy, chronic 01/23/2016  . MI (mitral incompetence) 07/22/2015  . Combined fat and carbohydrate induced hyperlipemia 12/26/2014  . UTI (lower urinary tract infection) 08/18/2014  . Blurry vision 08/18/2014  . Allergic rhinitis 07/12/2014  . Absolute anemia 07/12/2014  . Baker's cyst of knee 07/12/2014  . Atherosclerosis of coronary artery 07/12/2014  . CAFL (chronic airflow limitation) ([place]) 07/12/2014  . Dizziness 07/12/2014  . Essential (primary) hypertension 07/12/2014  . Acid reflux 07/12/2014  . 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 History of colon polyps 07/12/2014  . Hypercholesteremia 07/12/2014  . Malaise and fatigue 07/12/2014  . Heart attack ([place]) 07/12/2014  . Muscle ache 07/12/2014  . Arthritis, degenerative 07/12/2014  . Bradycardia 08/23/2013  . Arteriosclerosis of coronary artery 08/17/2013  . Diabetes ([place]) 08/17/2013  . Diabetes mellitus ([place]) 08/17/2013  . Peripheral vascular disease ([place]) 08/17/2013    Past Surgical History:  Procedure Laterality Date  . ABDOMINAL HYSTERECTOMY    . APPENDECTOMY    . BREAST BIOPSY Right 07/23/2016   INVASIVE MAMMARY CARCINOMA WITH AREAS OF EXTRACELLULAR MUCIN  . BREAST EXCISIONAL BIOPSY     INVASIVE MUCINOUS MAMMARY CARCINOMA.   Marland Kitchen BREAST LUMPECTOMY Right 08/20/2016   INVASIVE MUCINOUS MAMMARY CARCINOMA. /Grade 2   . COLONOSCOPY  2016  . CORONARY ANGIOPLASTY WITH STENT PLACEMENT    . CYSTOSCOPY    . PARTIAL MASTECTOMY  WITH AXILLARY SENTINEL LYMPH NODE BIOPSY Right 08/09/2016   Procedure: PARTIAL MASTECTOMY WITH AXILLARY SENTINEL LYMPH NODE BIOPSY;  Surgeon: Robert Bellow, MD;  Location: [facility] ORS;  Service: General;  Laterality: Right;  . TONSILLECTOMY      Prior to Admission medications   Medication Sig Start Date End Date Taking? Authorizing Provider  amLODipine (NORVASC) 5 MG tablet TAKE 1 TABLET BY MOUTH EVERY DAY 11/02/17    Jerrol Banana., MD  aspirin EC 81 MG tablet Take 81 mg by mouth daily.    [provider]  Clobetasol Prop Emollient Base 0.05 % emollient cream  05/03/17   [provider]  letrozole [facility]) 2.5 MG tablet Take 1 tablet by mouth daily 01/06/18   Lloyd Huger, MD  lisinopril (PRINIVIL,ZESTRIL) 20 MG tablet TAKE 1 TABLET BY MOUTH EVERY DAY 10/12/16   [provider]  nystatin cream (MYCOSTATIN) Apply 1 application topically 2 (two) times daily. Patient not taking: Reported on 01/11/2018 08/30/17   Jerrol Banana., MD  omeprazole (PRILOSEC) 20 MG capsule Take 20 mg by mouth daily as needed (indigestion).    [provider]  pravastatin (PRAVACHOL) 10 MG tablet Take 10 mg by mouth daily.    [provider]  sucralfate (CARAFATE) 1 g tablet Take 1 tablet (1 g total) by mouth 4 (four) times daily. Patient not taking: Reported on 01/11/2018 12/11/17   Nance Pear, MD  triamcinolone cream (KENALOG) 0.1 % APPLY TWICE DAILY TO RASH ON RIGHT BREAST UNTIL CLEAR 04/21/17   [provider]    Allergies  Allergen Reactions  . Celebrex  [Celecoxib]     Dark stools.  . Penicillins Other (See Comments)    Childhood reaction Has patient had a PCN reaction causing immediate rash, facial/tongue/throat swelling, SOB or lightheadedness with hypotension: Unknown Has patient had a PCN reaction causing severe rash involving mucus membranes or skin necrosis: Unknown Has patient had a PCN reaction that required hospitalization: Unknown Has patient had a PCN reaction occurring within the last 10 years: Unknown If all of the above answers are "NO", then may proceed with Cephalosporin use.   Marland Kitchen Prevacid [Lansoprazole] Diarrhea    Family History  Problem Relation Age of Onset  . Hyperlipidemia Mother   . Allergies Mother   . Cerebral aneurysm Mother        cause of death at age 27  . Heart disease Father        Fatal MI ag 70  . Cancer  Brother        lung cancer  . Hyperlipidemia Brother   . Colonic polyp Brother   . Healthy Son   . Cancer - Lung Brother        colon  . Healthy Son   . Breast cancer Neg Hx     Social History Social History   Tobacco Use  . Smoking status: Former Smoker    Packs/day: 0.25    Years: 10.00    Pack years: 2.50    Types: Cigarettes    Last attempt to quit: 03/06/1998    Years since quitting: 19.8  . Smokeless tobacco: Never Used  Substance Use Topics  . Alcohol use: No  . Drug use: No    Review of Systems Constitutional: Negative for fever. Cardiovascular: Negative for chest pain. Respiratory: Negative for shortness of breath. Gastrointestinal: Negative for abdominal pain, vomiting Musculoskeletal: Negative for musculoskeletal complaints Skin: Negative for skin complaints  Neurological: Negative for headache  All other ROS negative  ____________________________________________   PHYSICAL EXAM:  VITAL SIGNS: ED Triage Vitals [01/11/18 1921]  Enc Vitals Group     BP 131/71     Pulse Rate 76     Resp 16     Temp 98 F (36.7 C)     Temp Source Oral     SpO2 92 %     Weight 150 lb (68 kg)     Height 5\' 5"  (1.651 m)     Head Circumference      Peak Flow      Pain Score 0     Pain Loc      Pain Edu?      Excl. in [place]?    Constitutional: Alert and oriented. Well appearing and in no distress. Eyes: Normal exam ENT   Head: Normocephalic and atraumatic.  No carotid bruit auscultated.   Mouth/Throat: Mucous membranes are moist. Cardiovascular: Normal rate, regular rhythm. No murmur Respiratory: Normal respiratory effort without tachypnea nor retractions. Breath sounds are clear  Gastrointestinal: Soft and nontender. No distention.  Musculoskeletal: Nontender with normal range of motion in all extremities.  Neurologic:  Normal speech and language. No gross focal neurologic deficits  Skin:  Skin is warm, dry and intact.  Psychiatric: Mood and affect are  normal.   ____________________________________________    EKG  EKG reviewed and interpreted by myself shows normal sinus rhythm at 76 bpm with a narrow QRS, normal axis, normal intervals, no concerning ST changes.  ____________________________________________    ZOXWRUEAV  Patient's carotids and vertebral arteries are normal, no significant stenosis.  ____________________________________________   INITIAL IMPRESSION / ASSESSMENT AND PLAN / ED COURSE  Pertinent labs & imaging results that were available during my care of the patient were reviewed by me and considered in my medical decision making (see chart for details).  Patient presents to the emergency department after syncopal episode while sitting under a hair dryer.  Differential would include vasovagal, orthostatic, cardiogenic, carotid stenosis, ACS, anemia.  Reassuringly patient appears extremely well, normal vitals, labs are largely within normal limits.  Troponin is negative, EKG is reassuring.  Highly suspect syncope caused by hairdryer leading to vasodilation.  We will also check a CT angiography of the neck given the patient's complaints of 3 months of dizziness/near syncope when looking strongly to the right or to the left which would raise concern for possible carotid stenosis.  We will overall patient appears extremely well this time, calm, cooperative, pleasant eating, sitting on the side of the bed and in no distress.  CT angiography negative.  Patient continues to appear extremely well.  We will discharge home with cardiology follow-up.  Patient agreeable to plan of care.  I recommended increasing oral hydration over the next several days.  Patient agreeable.  ____________________________________________   FINAL CLINICAL IMPRESSION(S) / ED DIAGNOSES  Syncope    Harvest Dark, MD 01/11/18 2209

## 2018-01-11 NOTE — ED Triage Notes (Signed)
Pt in via POV, reports syncopal episode this afternoon while having hair done w/ period of incontinence.  Declined having EMS bring her in, was seen at [facility] and sent over here.  Pt A/Ox4, ambulatory to triage, vitals WDL at this time.

## 2018-01-12 NOTE — Telephone Encounter (Signed)
Records were sent on 12/27/17 and our office confirmed with them that we received them.  They are going to send them to me again.

## 2018-01-13 NOTE — Telephone Encounter (Signed)
Records received and Dr Rosanna Randy to review

## 2018-01-16 ENCOUNTER — Other Ambulatory Visit: Payer: PPO

## 2018-01-18 ENCOUNTER — Ambulatory Visit (INDEPENDENT_AMBULATORY_CARE_PROVIDER_SITE_OTHER): Payer: PPO | Admitting: Family Medicine

## 2018-01-18 ENCOUNTER — Encounter: Payer: Self-pay | Admitting: Family Medicine

## 2018-01-18 ENCOUNTER — Ambulatory Visit
Admission: RE | Admit: 2018-01-18 | Discharge: 2018-01-18 | Disposition: A | Discharge status: Home / Self Care | Payer: PPO | Source: Ambulatory Visit | Attending: Oncology | Admitting: Oncology

## 2018-01-18 VITALS — BP 138/72 | HR 80 | Temp 98.9°F | Resp 16 | Wt 150.0 lb

## 2018-01-18 DIAGNOSIS — Z170 Estrogen receptor positive status [ER+]: Secondary | ICD-10-CM

## 2018-01-18 DIAGNOSIS — Z13820 Encounter for screening for osteoporosis: Secondary | ICD-10-CM | POA: Insufficient documentation

## 2018-01-18 DIAGNOSIS — Z853 Personal history of malignant neoplasm of breast: Secondary | ICD-10-CM | POA: Insufficient documentation

## 2018-01-18 DIAGNOSIS — Z780 Asymptomatic menopausal state: Secondary | ICD-10-CM | POA: Insufficient documentation

## 2018-01-18 DIAGNOSIS — R55 Syncope and collapse: Secondary | ICD-10-CM | POA: Diagnosis not present

## 2018-01-18 DIAGNOSIS — M85832 Other specified disorders of bone density and structure, left forearm: Secondary | ICD-10-CM | POA: Diagnosis not present

## 2018-01-18 DIAGNOSIS — I2510 Atherosclerotic heart disease of native coronary artery without angina pectoris: Secondary | ICD-10-CM

## 2018-01-18 DIAGNOSIS — M8589 Other specified disorders of bone density and structure, multiple sites: Secondary | ICD-10-CM | POA: Diagnosis not present

## 2018-01-18 DIAGNOSIS — I10 Essential (primary) hypertension: Secondary | ICD-10-CM | POA: Diagnosis not present

## 2018-01-18 DIAGNOSIS — R42 Dizziness and giddiness: Secondary | ICD-10-CM | POA: Diagnosis not present

## 2018-01-18 DIAGNOSIS — C50211 Malignant neoplasm of upper-inner quadrant of right female breast: Secondary | ICD-10-CM

## 2018-01-18 NOTE — Progress Notes (Signed)
Patient: [name] [name] Female    DOB: 1940-05-24   77 y.o.   MRN: [id number] Visit Date: 01/18/2018  Today's Provider: Wilhemena Durie, MD   Chief Complaint  Patient presents with  . ER  follow up   Subjective:    HPI  Follow Up ER Visit  Patient is here for ER follow up.  She was recently seen at [facility] for syncope on 01/11/2018.  She reports good compliance with treatment. She reports this condition is Improved. Patient reports that she has not had any more episodes since then.   She reports that she saw Dr. Nehemiah Massed, and he has taken her off of amlodipine to see if her dizzy spells would improve. She reports that she done this prior to her episode.     Allergies  Allergen Reactions  . Celebrex  [Celecoxib]     Dark stools.  . Penicillins Other (See Comments)    Childhood reaction Has patient had a PCN reaction causing immediate rash, facial/tongue/throat swelling, SOB or lightheadedness with hypotension: Unknown Has patient had a PCN reaction causing severe rash involving mucus membranes or skin necrosis: Unknown Has patient had a PCN reaction that required hospitalization: Unknown Has patient had a PCN reaction occurring within the last 10 years: Unknown If all of the above answers are "NO", then may proceed with Cephalosporin use.   Marland Kitchen Prevacid [Lansoprazole] Diarrhea     Current Outpatient Medications:  .  aspirin EC 81 MG tablet, Take 81 mg by mouth daily., Disp: , Rfl:  .  Clobetasol Prop Emollient Base 0.05 % emollient cream, , Disp: , Rfl:  .  letrozole (FEMARA) 2.5 MG tablet, Take 1 tablet by mouth daily, Disp: 90 tablet, Rfl: 3 .  lisinopril (PRINIVIL,ZESTRIL) 20 MG tablet, TAKE 1 TABLET BY MOUTH EVERY DAY, Disp: , Rfl:  .  omeprazole (PRILOSEC) 20 MG capsule, Take 20 mg by mouth daily as needed (indigestion)., Disp: , Rfl:  .  pravastatin (PRAVACHOL) 10 MG tablet, Take 10 mg by mouth daily., Disp: , Rfl:  .  triamcinolone cream (KENALOG) 0.1 %,  APPLY TWICE DAILY TO RASH ON RIGHT BREAST UNTIL CLEAR, Disp: , Rfl: 0 .  amLODipine (NORVASC) 5 MG tablet, TAKE 1 TABLET BY MOUTH EVERY DAY (Patient not taking: Reported on 01/18/2018), Disp: 90 tablet, Rfl: 4 .  nystatin cream (MYCOSTATIN), Apply 1 application topically 2 (two) times daily. (Patient not taking: Reported on 01/11/2018), Disp: 30 g, Rfl: 0 .  sucralfate (CARAFATE) 1 g tablet, Take 1 tablet (1 g total) by mouth 4 (four) times daily. (Patient not taking: Reported on 01/11/2018), Disp: 60 tablet, Rfl: 0  Review of Systems  Constitutional: Negative.   HENT: Positive for postnasal drip. Negative for congestion.   Eyes: Negative.   Respiratory: Positive for cough.   Cardiovascular: Negative.  Negative for chest pain, palpitations and leg swelling.  Gastrointestinal: Negative.   Endocrine: Negative.   Allergic/Immunologic: Positive for environmental allergies.  Neurological: Negative for dizziness, syncope, weakness, light-headedness and headaches.  Psychiatric/Behavioral: Negative.     Social History   Tobacco Use  . Smoking status: Former Smoker    Packs/day: 0.25    Years: 10.00    Pack years: 2.50    Types: Cigarettes    Last attempt to quit: 03/06/1998    Years since quitting: 19.8  . Smokeless tobacco: Never Used  Substance Use Topics  . Alcohol use: No   Objective:   BP 138/72 (BP  Location: Left Arm, Patient Position: Sitting, Cuff Size: Normal)   Pulse 80   Temp 98.9 F (37.2 C)   Resp 16   Wt 150 lb (68 kg)   BMI 24.96 kg/m  Vitals:   01/18/18 1131  BP: 138/72  Pulse: 80  Resp: 16  Temp: 98.9 F (37.2 C)  Weight: 150 lb (68 kg)     Physical Exam  Constitutional: She is oriented to person, place, and time. She appears well-developed and well-nourished.  HENT:  Head: Normocephalic and atraumatic.  Right Ear: External ear normal.  Left Ear: External ear normal.  Nose: Nose normal.  Eyes: No scleral icterus.  Neck: No thyromegaly present.    Cardiovascular: Normal rate, regular rhythm and normal heart sounds.  Pulmonary/Chest: Effort normal and breath sounds normal.  Abdominal: Soft.  Musculoskeletal: She exhibits no edema.  Neurological: She is alert and oriented to person, place, and time.  Nonfocal,no nystagmus.  Skin: Skin is warm and dry.  Psychiatric: She has a normal mood and affect. Her behavior is normal. Judgment and thought content normal.        Assessment & Plan:     1. Dizziness Sees cardiology regularly. - Ambulatory referral to Neurology  2. Syncope, unspecified syncope type Vasovagal likely with overheating. - Ambulatory referral to Neurology  3. Atherosclerosis of coronary artery of native heart without angina pectoris, unspecified vessel or lesion type   4. Essential (primary) hypertension Off of Norvasc.  I have done the exam and reviewed the chart and it is accurate to the best of my knowledge. Development worker, community has been used and  any errors in dictation or transcription are unintentional. Miguel Aschoff M.D. [facility]     Wilhemena Durie, MD

## 2018-01-20 ENCOUNTER — Ambulatory Visit: Payer: PPO

## 2018-01-25 ENCOUNTER — Telehealth: Payer: Self-pay | Admitting: Family Medicine

## 2018-01-25 ENCOUNTER — Telehealth: Payer: Self-pay

## 2018-01-25 NOTE — Telephone Encounter (Signed)
Pt needing McKenzie to call her regarding her AWV. Needling to ask some questions.  Thanks, [employer]

## 2018-01-25 NOTE — Telephone Encounter (Signed)
LMTCB and reschedule previously missed AWV. -MM

## 2018-01-26 NOTE — Telephone Encounter (Signed)
Scheduled AWV for 02/21/18 @ 9 AM. -MM

## 2018-02-07 DIAGNOSIS — E782 Mixed hyperlipidemia: Secondary | ICD-10-CM | POA: Diagnosis not present

## 2018-02-07 DIAGNOSIS — I6523 Occlusion and stenosis of bilateral carotid arteries: Secondary | ICD-10-CM | POA: Diagnosis not present

## 2018-02-07 DIAGNOSIS — I10 Essential (primary) hypertension: Secondary | ICD-10-CM | POA: Diagnosis not present

## 2018-02-07 DIAGNOSIS — I340 Nonrheumatic mitral (valve) insufficiency: Secondary | ICD-10-CM | POA: Diagnosis not present

## 2018-02-07 DIAGNOSIS — I739 Peripheral vascular disease, unspecified: Secondary | ICD-10-CM | POA: Diagnosis not present

## 2018-02-07 DIAGNOSIS — I2510 Atherosclerotic heart disease of native coronary artery without angina pectoris: Secondary | ICD-10-CM | POA: Diagnosis not present

## 2018-02-21 ENCOUNTER — Ambulatory Visit (INDEPENDENT_AMBULATORY_CARE_PROVIDER_SITE_OTHER): Payer: PPO

## 2018-02-21 VITALS — BP 124/70 | HR 75 | Temp 98.7°F | Ht 62.0 in | Wt 148.2 lb

## 2018-02-21 DIAGNOSIS — Z0000 Encounter for general adult medical examination without abnormal findings: Secondary | ICD-10-CM

## 2018-02-21 DIAGNOSIS — Z23 Encounter for immunization: Secondary | ICD-10-CM | POA: Diagnosis not present

## 2018-02-21 NOTE — Progress Notes (Addendum)
Subjective:   [name] [name] is a 77 y.o. female who presents for Medicare Annual (Subsequent) preventive examination.  Review of Systems:  N/A  Cardiac Risk Factors include: advanced age (>49men, >27 women);diabetes mellitus;dyslipidemia;hypertension     Objective:     Vitals: BP 124/70 (BP Location: Right Arm)   Pulse 75   Temp 98.7 F (37.1 C) (Oral)   Ht 5\' 2"  (1.575 m)   Wt 148 lb 3.2 oz (67.2 kg)   BMI 27.11 kg/m   Body mass index is 27.11 kg/m.  Advanced Directives 02/21/2018 01/11/2018 01/11/2018 12/11/2017 11/04/2017 07/04/2017 05/05/2017  Does Patient Have a Medical Advance Directive? Yes Yes Yes Yes Yes No No  Type of Paramedic of [place];Living will [place];Living will [place];Living will - [place];Living will - -  Does patient want to make changes to medical advance directive? - No - Patient declined - - No - Patient declined - -  Copy of [place] in Chart? No - copy requested No - copy requested No - copy requested - No - copy requested - -  Would patient like information on creating a medical advance directive? - - - - - - No - Patient declined    Tobacco Social History   Tobacco Use  Smoking Status Former Smoker  . Packs/day: 0.25  . Years: 10.00  . Pack years: 2.50  . Types: Cigarettes  . Last attempt to quit: 03/06/1998  . Years since quitting: 19.9  Smokeless Tobacco Never Used     Counseling given: Not Answered   Clinical Intake:  Pre-visit preparation completed: Yes  Pain : No/denies pain Pain Score: 0-No pain    Diabetes:  Is the patient diabetic?  Yes  If diabetic, was a CBG obtained today?  No  Did the patient bring in their glucometer from home?  No  How often do you monitor your CBG's? Does not.   Financial Strains and Diabetes Management:  Are you having any financial strains with the device, your supplies or your  medication? No .  Does the patient want to be seen by Chronic Care Management for management of their diabetes?  No  Would the patient like to be referred to a Nutritionist or for Diabetic Management?  No   Diabetic Exams:  Diabetic Eye Exam: Completed this month per pt. Overdue for diabetic eye exam. Requested records be sent to office.   Diabetic Foot Exam: Pt has been advised about the importance in completing this exam. Note made to f/u on this at next OV.    Nutritional Status: BMI 25 -29 Overweight Nutritional Risks: None  How often do you need to have someone help you when you read instructions, pamphlets, or other written materials from your doctor or pharmacy?: 1 - Never  Interpreter Needed?: No  Information entered by :: [facility], LPN  Past Medical History:  Diagnosis Date  . 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 Mucin noted on biopsy, not on wide excision.  . Coronary artery disease   . GERD (gastroesophageal reflux disease)   . Hyperlipidemia   . Hypertension   . Lichen planus 83/38/2505   Right breast in field of whole breast radiation. Dr. Kellie Moor DX by punch bioppsy  . Lichen planus    right breast   . MI (myocardial infarction) ([place])    2015  . Personal history of radiation  therapy 09/2016   RIGHT lumpectomy w/ radiation   Past Surgical History:  Procedure Laterality Date  . ABDOMINAL HYSTERECTOMY    . APPENDECTOMY    . BREAST BIOPSY Right 07/23/2016   INVASIVE MAMMARY CARCINOMA WITH AREAS OF EXTRACELLULAR MUCIN  . BREAST EXCISIONAL BIOPSY     INVASIVE MUCINOUS MAMMARY CARCINOMA.   Marland Kitchen BREAST LUMPECTOMY Right 08/20/2016   INVASIVE MUCINOUS MAMMARY CARCINOMA. /Grade 2   . COLONOSCOPY  2016  . CORONARY ANGIOPLASTY WITH STENT PLACEMENT    . CYSTOSCOPY    . OOPHORECTOMY    . PARTIAL MASTECTOMY WITH AXILLARY SENTINEL LYMPH NODE BIOPSY Right 08/09/2016   Procedure: PARTIAL MASTECTOMY WITH AXILLARY  SENTINEL LYMPH NODE BIOPSY;  Surgeon: Robert Bellow, MD;  Location: [facility] ORS;  Service: General;  Laterality: Right;  . TONSILLECTOMY     Family History  Problem Relation Age of Onset  . Hyperlipidemia Mother   . Allergies Mother   . Cerebral aneurysm Mother        cause of death at age 54  . Heart disease Father        Fatal MI ag 50  . Cancer Brother        lung cancer  . Hyperlipidemia Brother   . Colonic polyp Brother   . Healthy Son   . Cancer - Lung Brother        colon  . Healthy Son   . Breast cancer Neg Hx    Social History   Socioeconomic History  . Marital status: Married    Spouse name: [name]  . Number of children: 2  . Years of education: Not on file  . Highest education level: High school graduate  Occupational History  . Occupation: retired  Scientific laboratory technician  . Financial resource strain: Not hard at all  . Food insecurity:    Worry: Never true    Inability: Never true  . Transportation needs:    Medical: No    Non-medical: No  Tobacco Use  . Smoking status: Former Smoker    Packs/day: 0.25    Years: 10.00    Pack years: 2.50    Types: Cigarettes    Last attempt to quit: 03/06/1998    Years since quitting: 19.9  . Smokeless tobacco: Never Used  Substance and Sexual Activity  . Alcohol use: No  . Drug use: No  . Sexual activity: Yes    Birth control/protection: Surgical  Lifestyle  . Physical activity:    Days per week: 0 days    Minutes per session: 0 min  . Stress: Not at all  Relationships  . Social connections:    Talks on phone: Patient refused    Gets together: Patient refused    Attends religious service: Patient refused    Active member of club or organization: Patient refused    Attends meetings of clubs or organizations: Patient refused    Relationship status: Patient refused  Other Topics Concern  . Not on file  Social History Narrative  . Not on file    Outpatient Encounter Medications as of 02/21/2018  Medication Sig    . aspirin EC 81 MG tablet Take 81 mg by mouth daily.  Marland Kitchen letrozole (FEMARA) 2.5 MG tablet Take 1 tablet by mouth daily  . lisinopril (PRINIVIL,ZESTRIL) 20 MG tablet TAKE 1 TABLET BY MOUTH EVERY DAY  . nystatin cream (MYCOSTATIN) Apply 1 application topically 2 (two) times daily. (Patient taking differently: Apply 1 application topically 2 (two) times daily.  As needed)  . omeprazole (PRILOSEC) 20 MG capsule Take 20 mg by mouth daily as needed (indigestion).  . pravastatin (PRAVACHOL) 10 MG tablet Take 10 mg by mouth daily.  Marland Kitchen amLODipine (NORVASC) 5 MG tablet TAKE 1 TABLET BY MOUTH EVERY DAY (Patient not taking: Reported on 01/18/2018)  . Clobetasol Prop Emollient Base 0.05 % emollient cream   . sucralfate (CARAFATE) 1 g tablet Take 1 tablet (1 g total) by mouth 4 (four) times daily. (Patient not taking: Reported on 01/11/2018)  . triamcinolone cream (KENALOG) 0.1 % APPLY TWICE DAILY TO RASH ON RIGHT BREAST UNTIL CLEAR   No facility-administered encounter medications on file as of 02/21/2018.     Activities of Daily Living In your present state of health, do you have any difficulty performing the following activities: 02/21/2018  Hearing? N  Vision? N  Difficulty concentrating or making decisions? Y  Walking or climbing stairs? Y  Comment Due to right knee pain.  Dressing or bathing? N  Doing errands, shopping? N  Preparing Food and eating ? N  Using the Toilet? N  In the past six months, have you accidently leaked urine? N  Do you have problems with loss of bowel control? N  Managing your Medications? N  Managing your Finances? N  Housekeeping or managing your Housekeeping? N  Some recent data might be hidden    Patient Care Team: Jerrol Banana., MD as PCP - General (Family Medicine) Corey Skains, MD as Consulting Physician (Cardiology) Odette Fraction as Consulting Physician (Optometry) Oneta Rack, MD as Consulting Physician (Dermatology) Bary Castilla, [name], MD (General Surgery) Lloyd Huger, MD as Consulting Physician (Oncology)    Assessment:   This is a routine wellness examination for Anjana.  Exercise Activities and Dietary recommendations Current Exercise Habits: The patient does not participate in regular exercise at present, Exercise limited by: None identified  Goals    . Exercise      Recommend increasing exercise every week. Pt to start walking 3 times a week for at least 20 minutes.   02/21/18- Recommend to start exercising 3 days a week for at least 30 minutes at a time.        Fall Risk Fall Risk  02/21/2018 11/25/2016 06/30/2016 02/04/2016 08/28/2014  Falls in the past year? 0 No No No No   FALL RISK PREVENTION PERTAINING TO THE HOME:  Any stairs in or around the home WITH handrails? No  Home free of loose throw rugs in walkways, pet beds, electrical cords, etc? Yes  Adequate lighting in your home to reduce risk of falls? Yes   ASSISTIVE DEVICES UTILIZED TO PREVENT FALLS:  Life alert? No  Use of a cane, walker or w/c? No  Grab bars in the bathroom? Yes  Shower chair or bench in shower? No  Elevated toilet seat or a handicapped toilet? No    TIMED UP AND GO:  Was the test performed? No .     Depression Screen PHQ 2/9 Scores 02/21/2018 02/21/2018 11/25/2016 06/30/2016  PHQ - 2 Score 0 0 0 0  PHQ- 9 Score 1 - - 0     Cognitive Function: Declined today.      6CIT Screen 06/30/2016  What Year? 0 points  What month? 0 points  What time? 0 points  Count back from 20 0 points  Months in reverse 0 points  Repeat phrase 2 points  Total Score 2    Immunization History  Administered  Date(s) Administered  . Influenza, High Dose Seasonal PF 02/06/2014, 01/20/2017, 02/21/2018  . Influenza,inj,Quad PF,6+ Mos 03/05/2013  . Pneumococcal Conjugate-13 02/06/2014  . Pneumococcal Polysaccharide-23 07/04/2012  . Tdap 07/04/2012  . Zoster 07/04/2012    Qualifies for Shingles Vaccine? Yes  Zostavax  completed 07/04/12. Due for Shingrix. Education has been provided regarding the importance of this vaccine. Pt has been advised to call insurance company to determine out of pocket expense. Advised may also receive vaccine at local pharmacy or Health Dept. Verbalized acceptance and understanding.  Tdap: Up to date  Flu Vaccine: Due for Flu vaccine. Does the patient want to receive this vaccine today?  Yes .  Pneumococcal Vaccine: Up to date  Screening Tests Health Maintenance  Topic Date Due  . FOOT EXAM  11/17/1950  . HEMOGLOBIN A1C  12/16/2017  . OPHTHALMOLOGY EXAM  12/28/2018  . TETANUS/TDAP  07/05/2022  . DEXA SCAN  01/19/2023  . INFLUENZA VACCINE  Completed  . PNA vac Low Risk Adult  Completed    Cancer Screenings:  Colorectal Screening: No longer required.   Mammogram: No longer required.   Bone Density: Completed 01/24/17. Results reflect OSTEOPENIA. Repeat every 5 years.   Lung Cancer Screening: (Low Dose CT Chest recommended if Age 76-80 years, 30 pack-year currently smoking OR have quit w/in 15years.) does not qualify.    Additional Screening:  Vision Screening: Recommended annual ophthalmology exams for early detection of glaucoma and other disorders of the eye.  Dental Screening: Recommended annual dental exams for proper oral hygiene  Community Resource Referral:  CRR required this visit?  No       Plan:  I have personally reviewed and addressed the Medicare Annual Wellness questionnaire and have noted the following in the patient's chart:  A. Medical and social history B. Use of alcohol, tobacco or illicit drugs  C. Current medications and supplements D. Functional ability and status E.  Nutritional status F.  Physical activity G. Advance directives H. List of other physicians I.  Hospitalizations, surgeries, and ER visits in previous 12 months J.  [place] such as hearing and vision if needed, cognitive and depression L. Referrals and  appointments - none  In addition, I have reviewed and discussed with patient certain preventive protocols, quality metrics, and best practice recommendations. A written personalized care plan for preventive services as well as general preventive health recommendations were provided to patient.  See attached scanned questionnaire for additional information.   Signed,  Fabio Neighbors, LPN Nurse Health Advisor   Nurse Recommendations: Pt needs a diabetic foot exam and Hgb A1c checked at next OV.

## 2018-02-21 NOTE — Patient Instructions (Signed)
[name] , Thank you for taking time to come for your Medicare Wellness Visit. I appreciate your ongoing commitment to your health goals. Please review the following plan we discussed and let me know if I can assist you in the future.   Screening recommendations/referrals: Colonoscopy: No longer required.  Mammogram: No longer required.  Bone Density: Up to date, due 01/2022 Recommended yearly ophthalmology/optometry visit for glaucoma screening and checkup Recommended yearly dental visit for hygiene and checkup  Vaccinations: Influenza vaccine: Administered today. Pneumococcal vaccine: Completed series Tdap vaccine: Up to date, due 06/2022 Shingles vaccine: Pt declines today.     Advanced directives: Please bring a copy of your POA (Power of Attorney) and/or Living Will to your next appointment.   Conditions/risks identified: 02/21/18- Recommend to start exercising 3 days a week for at least 30 minutes at a time.   Next appointment: 04/25/18 with Dr Rosanna Randy.    Preventive Care 79 Years and Older, Female Preventive care refers to lifestyle choices and visits with your health care provider that can promote health and wellness. What does preventive care include?  A yearly physical exam. This is also called an annual well check.  Dental exams once or twice a year.  Routine eye exams. Ask your health care provider how often you should have your eyes checked.  Personal lifestyle choices, including:  Daily care of your teeth and gums.  Regular physical activity.  Eating a healthy diet.  Avoiding tobacco and drug use.  Limiting alcohol use.  Practicing safe sex.  Taking low-dose aspirin every day.  Taking vitamin and mineral supplements as recommended by your health care provider. What happens during an annual well check? The services and screenings done by your health care provider during your annual well check will depend on your age, overall health, lifestyle risk  factors, and family history of disease. Counseling  Your health care provider may ask you questions about your:  Alcohol use.  Tobacco use.  Drug use.  Emotional well-being.  Home and relationship well-being.  Sexual activity.  Eating habits.  History of falls.  Memory and ability to understand (cognition).  Work and work Statistician.  Reproductive health. Screening  You may have the following tests or measurements:  Height, weight, and BMI.  Blood pressure.  Lipid and cholesterol levels. These may be checked every 5 years, or more frequently if you are over 45 years old.  Skin check.  Lung cancer screening. You may have this screening every year starting at age 75 if you have a 30-pack-year history of smoking and currently smoke or have quit within the past 15 years.  Fecal occult blood test (FOBT) of the stool. You may have this test every year starting at age 47.  Flexible sigmoidoscopy or colonoscopy. You may have a sigmoidoscopy every 5 years or a colonoscopy every 10 years starting at age 30.  Hepatitis C blood test.  Hepatitis B blood test.  Sexually transmitted disease (STD) testing.  Diabetes screening. This is done by checking your blood sugar (glucose) after you have not eaten for a while (fasting). You may have this done every 1-3 years.  Bone density scan. This is done to screen for osteoporosis. You may have this done starting at age 77.  Mammogram. This may be done every 1-2 years. Talk to your health care provider about how often you should have regular mammograms. Talk with your health care provider about your test results, treatment options, and if necessary, the need for  more tests. Vaccines  Your health care provider may recommend certain vaccines, such as:  Influenza vaccine. This is recommended every year.  Tetanus, diphtheria, and acellular pertussis (Tdap, Td) vaccine. You may need a Td booster every 10 years.  Zoster vaccine. You  may need this after age 36.  Pneumococcal 13-valent conjugate (PCV13) vaccine. One dose is recommended after age 16.  Pneumococcal polysaccharide (PPSV23) vaccine. One dose is recommended after age 64. Talk to your health care provider about which screenings and vaccines you need and how often you need them. This information is not intended to replace advice given to you by your health care provider. Make sure you discuss any questions you have with your health care provider. Document Released: 03/21/2015 Document Revised: 11/12/2015 Document Reviewed: 12/24/2014 Elsevier Interactive Patient Education  2017 [place] Prevention in the Home Falls can cause injuries. They can happen to people of all ages. There are many things you can do to make your home safe and to help prevent falls. What can I do on the outside of my home?  Regularly fix the edges of walkways and driveways and fix any cracks.  Remove anything that might make you trip as you walk through a door, such as a raised step or threshold.  Trim any bushes or trees on the path to your home.  Use bright outdoor lighting.  Clear any walking paths of anything that might make someone trip, such as rocks or tools.  Regularly check to see if handrails are loose or broken. Make sure that both sides of any steps have handrails.  Any raised decks and porches should have guardrails on the edges.  Have any leaves, snow, or ice cleared regularly.  Use sand or salt on walking paths during winter.  Clean up any spills in your garage right away. This includes oil or grease spills. What can I do in the bathroom?  Use night lights.  Install grab bars by the toilet and in the tub and shower. Do not use towel bars as grab bars.  Use non-skid mats or decals in the tub or shower.  If you need to sit down in the shower, use a plastic, non-slip stool.  Keep the floor dry. Clean up any water that spills on the floor as soon as  it happens.  Remove soap buildup in the tub or shower regularly.  Attach bath mats securely with double-sided non-slip rug tape.  Do not have throw rugs and other things on the floor that can make you trip. What can I do in the bedroom?  Use night lights.  Make sure that you have a light by your bed that is easy to reach.  Do not use any sheets or blankets that are too big for your bed. They should not hang down onto the floor.  Have a firm chair that has side arms. You can use this for support while you get dressed.  Do not have throw rugs and other things on the floor that can make you trip. What can I do in the kitchen?  Clean up any spills right away.  Avoid walking on wet floors.  Keep items that you use a lot in easy-to-reach places.  If you need to reach something above you, use a strong step stool that has a grab bar.  Keep electrical cords out of the way.  Do not use floor polish or wax that makes floors slippery. If you must use wax, use non-skid  floor wax.  Do not have throw rugs and other things on the floor that can make you trip. What can I do with my stairs?  Do not leave any items on the stairs.  Make sure that there are handrails on both sides of the stairs and use them. Fix handrails that are broken or loose. Make sure that handrails are as long as the stairways.  Check any carpeting to make sure that it is firmly attached to the stairs. Fix any carpet that is loose or worn.  Avoid having throw rugs at the top or bottom of the stairs. If you do have throw rugs, attach them to the floor with carpet tape.  Make sure that you have a light switch at the top of the stairs and the bottom of the stairs. If you do not have them, ask someone to add them for you. What else can I do to help prevent falls?  Wear shoes that:  Do not have high heels.  Have rubber bottoms.  Are comfortable and fit you well.  Are closed at the toe. Do not wear sandals.  If  you use a stepladder:  Make sure that it is fully opened. Do not climb a closed stepladder.  Make sure that both sides of the stepladder are locked into place.  Ask someone to hold it for you, if possible.  Clearly mark and make sure that you can see:  Any grab bars or handrails.  First and last steps.  Where the edge of each step is.  Use tools that help you move around (mobility aids) if they are needed. These include:  Canes.  Walkers.  Scooters.  Crutches.  Turn on the lights when you go into a dark area. Replace any light bulbs as soon as they burn out.  Set up your furniture so you have a clear path. Avoid moving your furniture around.  If any of your floors are uneven, fix them.  If there are any pets around you, be aware of where they are.  Review your medicines with your doctor. Some medicines can make you feel dizzy. This can increase your chance of falling. Ask your doctor what other things that you can do to help prevent falls. This information is not intended to replace advice given to you by your health care provider. Make sure you discuss any questions you have with your health care provider. Document Released: 12/19/2008 Document Revised: 07/31/2015 Document Reviewed: 03/29/2014 Elsevier Interactive Patient Education  2017 [employer].

## 2018-03-14 DIAGNOSIS — R55 Syncope and collapse: Secondary | ICD-10-CM | POA: Diagnosis not present

## 2018-03-14 DIAGNOSIS — R42 Dizziness and giddiness: Secondary | ICD-10-CM | POA: Diagnosis not present

## 2018-03-17 NOTE — Telephone Encounter (Signed)
Completed AWV on 02/21/18. -MM

## 2018-03-20 DIAGNOSIS — R55 Syncope and collapse: Secondary | ICD-10-CM | POA: Insufficient documentation

## 2018-03-30 DIAGNOSIS — M1711 Unilateral primary osteoarthritis, right knee: Secondary | ICD-10-CM | POA: Diagnosis not present

## 2018-03-31 ENCOUNTER — Observation Stay
Admission: EM | Admit: 2018-03-31 | Discharge: 2018-04-01 | Disposition: A | Discharge status: Home / Self Care | Payer: PPO | Attending: Internal Medicine | Admitting: Internal Medicine

## 2018-03-31 ENCOUNTER — Other Ambulatory Visit: Payer: Self-pay

## 2018-03-31 ENCOUNTER — Emergency Department: Payer: PPO

## 2018-03-31 ENCOUNTER — Encounter: Payer: Self-pay | Admitting: Emergency Medicine

## 2018-03-31 DIAGNOSIS — Z9221 Personal history of antineoplastic chemotherapy: Secondary | ICD-10-CM | POA: Diagnosis not present

## 2018-03-31 DIAGNOSIS — Z923 Personal history of irradiation: Secondary | ICD-10-CM | POA: Diagnosis not present

## 2018-03-31 DIAGNOSIS — Z7982 Long term (current) use of aspirin: Secondary | ICD-10-CM | POA: Insufficient documentation

## 2018-03-31 DIAGNOSIS — Z853 Personal history of malignant neoplasm of breast: Secondary | ICD-10-CM | POA: Insufficient documentation

## 2018-03-31 DIAGNOSIS — G9589 Other specified diseases of spinal cord: Secondary | ICD-10-CM | POA: Diagnosis not present

## 2018-03-31 DIAGNOSIS — I10 Essential (primary) hypertension: Secondary | ICD-10-CM | POA: Diagnosis not present

## 2018-03-31 DIAGNOSIS — I2510 Atherosclerotic heart disease of native coronary artery without angina pectoris: Secondary | ICD-10-CM | POA: Insufficient documentation

## 2018-03-31 DIAGNOSIS — I252 Old myocardial infarction: Secondary | ICD-10-CM | POA: Diagnosis not present

## 2018-03-31 DIAGNOSIS — E785 Hyperlipidemia, unspecified: Secondary | ICD-10-CM | POA: Insufficient documentation

## 2018-03-31 DIAGNOSIS — J9811 Atelectasis: Secondary | ICD-10-CM | POA: Diagnosis not present

## 2018-03-31 DIAGNOSIS — R079 Chest pain, unspecified: Principal | ICD-10-CM | POA: Diagnosis present

## 2018-03-31 DIAGNOSIS — Z87891 Personal history of nicotine dependence: Secondary | ICD-10-CM | POA: Diagnosis not present

## 2018-03-31 DIAGNOSIS — K449 Diaphragmatic hernia without obstruction or gangrene: Secondary | ICD-10-CM | POA: Diagnosis not present

## 2018-03-31 DIAGNOSIS — Z79811 Long term (current) use of aromatase inhibitors: Secondary | ICD-10-CM | POA: Diagnosis not present

## 2018-03-31 DIAGNOSIS — Z79899 Other long term (current) drug therapy: Secondary | ICD-10-CM | POA: Diagnosis not present

## 2018-03-31 DIAGNOSIS — R0789 Other chest pain: Secondary | ICD-10-CM | POA: Diagnosis present

## 2018-03-31 DIAGNOSIS — K219 Gastro-esophageal reflux disease without esophagitis: Secondary | ICD-10-CM | POA: Insufficient documentation

## 2018-03-31 DIAGNOSIS — R000 Tachycardia, unspecified: Secondary | ICD-10-CM | POA: Diagnosis not present

## 2018-03-31 LAB — BASIC METABOLIC PANEL
Anion gap: 8 (ref 5–15)
BUN: 25 mg/dL — ABNORMAL HIGH (ref 8–23)
CHLORIDE: 107 mmol/L (ref 98–111)
CO2: 26 mmol/L (ref 22–32)
Calcium: 9.5 mg/dL (ref 8.9–10.3)
Creatinine, Ser: 1.11 mg/dL — ABNORMAL HIGH (ref 0.44–1.00)
GFR calc Af Amer: 55 mL/min — ABNORMAL LOW (ref >60)
GFR calc non Af Amer: 47 mL/min — ABNORMAL LOW (ref >60)
Glucose, Bld: 170 mg/dL — ABNORMAL HIGH (ref 70–99)
Potassium: 3.8 mmol/L (ref 3.5–5.1)
Sodium: 141 mmol/L (ref 135–145)

## 2018-03-31 LAB — TROPONIN I
TROPONIN I: 0.12 ng/mL — AB (ref <0.03)
Troponin I: 0.07 ng/mL (ref <0.03)
Troponin I: 0.1 ng/mL (ref <0.03)
Troponin I: 0.11 ng/mL (ref <0.03)

## 2018-03-31 LAB — CBC
HEMATOCRIT: 43.3 % (ref 36.0–46.0)
Hemoglobin: 14 g/dL (ref 12.0–15.0)
MCH: 30.1 pg (ref 26.0–34.0)
MCHC: 32.3 g/dL (ref 30.0–36.0)
MCV: 93.1 fL (ref 80.0–100.0)
Platelets: 271 10*3/uL (ref 150–400)
RBC: 4.65 MIL/uL (ref 3.87–5.11)
RDW: 14.6 % (ref 11.5–15.5)
WBC: 11.4 10*3/uL — ABNORMAL HIGH (ref 4.0–10.5)
nRBC: 0 % (ref 0.0–0.2)

## 2018-03-31 MED ORDER — AMLODIPINE BESYLATE 10 MG PO TABS
10.0000 mg | ORAL_TABLET | Freq: Every day | ORAL | Status: DC
  Administered 2018-03-31 – 2018-04-01 (×2): 10 mg via ORAL
  Filled 2018-03-31 (×2): qty 1

## 2018-03-31 MED ORDER — PRAVASTATIN SODIUM 10 MG PO TABS
10.0000 mg | ORAL_TABLET | Freq: Every day | ORAL | Status: DC
  Administered 2018-03-31 – 2018-04-01 (×2): 10 mg via ORAL
  Filled 2018-03-31 (×2): qty 1

## 2018-03-31 MED ORDER — SUCRALFATE 1 G PO TABS
1.0000 g | ORAL_TABLET | Freq: Four times a day (QID) | ORAL | Status: DC
  Administered 2018-03-31 – 2018-04-01 (×5): 1 g via ORAL
  Filled 2018-03-31 (×5): qty 1

## 2018-03-31 MED ORDER — HYDRALAZINE HCL 20 MG/ML IJ SOLN: 10.0000 mg | INTRAMUSCULAR | Status: DC | PRN

## 2018-03-31 MED ORDER — SODIUM CHLORIDE 0.9% FLUSH
3.0000 mL | Freq: Once | INTRAVENOUS | Status: AC
  Administered 2018-03-31: 3 mL via INTRAVENOUS

## 2018-03-31 MED ORDER — ALUM & MAG HYDROXIDE-SIMETH 200-200-20 MG/5ML PO SUSP
30.0000 mL | Freq: Once | ORAL | Status: AC
  Administered 2018-03-31: 30 mL via ORAL
  Filled 2018-03-31: qty 30

## 2018-03-31 MED ORDER — MORPHINE SULFATE (PF) 2 MG/ML IV SOLN: 2.0000 mg | INTRAVENOUS | Status: DC | PRN

## 2018-03-31 MED ORDER — ONDANSETRON HCL 4 MG/2ML IJ SOLN: 4.0000 mg | Freq: Four times a day (QID) | INTRAMUSCULAR | Status: DC | PRN

## 2018-03-31 MED ORDER — BUDESONIDE 0.5 MG/2ML IN SUSP
0.5000 mg | Freq: Two times a day (BID) | RESPIRATORY_TRACT | Status: DC
  Administered 2018-03-31 – 2018-04-01 (×2): 0.5 mg via RESPIRATORY_TRACT
  Filled 2018-03-31 (×2): qty 2

## 2018-03-31 MED ORDER — DOXYCYCLINE HYCLATE 100 MG PO TABS
100.0000 mg | ORAL_TABLET | Freq: Two times a day (BID) | ORAL | Status: DC
  Administered 2018-03-31 – 2018-04-01 (×2): 100 mg via ORAL
  Filled 2018-03-31 (×2): qty 1

## 2018-03-31 MED ORDER — LIDOCAINE VISCOUS HCL 2 % MT SOLN
15.0000 mL | Freq: Once | OROMUCOSAL | Status: AC
  Administered 2018-03-31: 15 mL via ORAL
  Filled 2018-03-31 (×2): qty 15

## 2018-03-31 MED ORDER — PANTOPRAZOLE SODIUM 40 MG PO TBEC
40.0000 mg | DELAYED_RELEASE_TABLET | Freq: Every day | ORAL | Status: DC
  Administered 2018-03-31 – 2018-04-01 (×2): 40 mg via ORAL
  Filled 2018-03-31 (×2): qty 1

## 2018-03-31 MED ORDER — LETROZOLE 2.5 MG PO TABS
2.5000 mg | ORAL_TABLET | Freq: Every day | ORAL | Status: DC
  Administered 2018-04-01: 2.5 mg via ORAL
  Filled 2018-03-31: qty 1

## 2018-03-31 MED ORDER — NYSTATIN 100000 UNIT/GM EX CREA
1.0000 "application " | TOPICAL_CREAM | Freq: Two times a day (BID) | CUTANEOUS | Status: DC
  Administered 2018-03-31 – 2018-04-01 (×2): 1 via TOPICAL
  Filled 2018-03-31: qty 15

## 2018-03-31 MED ORDER — AMLODIPINE BESYLATE 10 MG PO TABS: 10.0000 mg | ORAL_TABLET | Freq: Every day | ORAL | Status: DC

## 2018-03-31 MED ORDER — IPRATROPIUM-ALBUTEROL 0.5-2.5 (3) MG/3ML IN SOLN: 3.0000 mL | Freq: Three times a day (TID) | RESPIRATORY_TRACT | Status: DC

## 2018-03-31 MED ORDER — ALPRAZOLAM 0.25 MG PO TABS: 0.2500 mg | ORAL_TABLET | Freq: Two times a day (BID) | ORAL | Status: DC | PRN

## 2018-03-31 MED ORDER — LISINOPRIL 20 MG PO TABS
20.0000 mg | ORAL_TABLET | Freq: Every day | ORAL | Status: DC
  Administered 2018-04-01: 20 mg via ORAL
  Filled 2018-03-31: qty 1

## 2018-03-31 MED ORDER — ENOXAPARIN SODIUM 40 MG/0.4ML ~~LOC~~ SOLN
40.0000 mg | SUBCUTANEOUS | Status: DC
  Administered 2018-03-31: 40 mg via SUBCUTANEOUS
  Filled 2018-03-31: qty 0.4

## 2018-03-31 MED ORDER — HYDROCODONE-ACETAMINOPHEN 7.5-325 MG PO TABS: 1.0000 | ORAL_TABLET | Freq: Four times a day (QID) | ORAL | Status: DC | PRN

## 2018-03-31 MED ORDER — METOPROLOL TARTRATE 25 MG PO TABS
37.5000 mg | ORAL_TABLET | Freq: Two times a day (BID) | ORAL | Status: DC
  Administered 2018-03-31 – 2018-04-01 (×2): 37.5 mg via ORAL
  Filled 2018-03-31 (×2): qty 2

## 2018-03-31 MED ORDER — IPRATROPIUM-ALBUTEROL 0.5-2.5 (3) MG/3ML IN SOLN
3.0000 mL | Freq: Three times a day (TID) | RESPIRATORY_TRACT | Status: DC
  Administered 2018-04-01: 3 mL via RESPIRATORY_TRACT
  Filled 2018-03-31: qty 3

## 2018-03-31 MED ORDER — AMLODIPINE BESYLATE 5 MG PO TABS: 5.0000 mg | ORAL_TABLET | Freq: Every day | ORAL | Status: DC

## 2018-03-31 MED ORDER — HYOSCYAMINE SULFATE 0.125 MG SL SUBL
0.2500 mg | SUBLINGUAL_TABLET | Freq: Once | SUBLINGUAL | Status: AC
  Administered 2018-03-31: 0.25 mg via SUBLINGUAL
  Filled 2018-03-31: qty 2

## 2018-03-31 MED ORDER — METOPROLOL TARTRATE 5 MG/5ML IV SOLN
5.0000 mg | Freq: Once | INTRAVENOUS | Status: AC
  Administered 2018-03-31: 5 mg via INTRAVENOUS
  Filled 2018-03-31: qty 5

## 2018-03-31 MED ORDER — DICYCLOMINE HCL 10 MG/5ML PO SOLN
10.0000 mg | Freq: Once | ORAL | Status: AC
  Administered 2018-03-31: 10 mg via ORAL
  Filled 2018-03-31 (×2): qty 5

## 2018-03-31 MED ORDER — ASPIRIN EC 325 MG PO TBEC
325.0000 mg | DELAYED_RELEASE_TABLET | Freq: Every day | ORAL | Status: DC
  Administered 2018-04-01: 325 mg via ORAL
  Filled 2018-03-31: qty 1

## 2018-03-31 MED ORDER — ASPIRIN 81 MG PO CHEW
243.0000 mg | CHEWABLE_TABLET | Freq: Once | ORAL | Status: AC
  Administered 2018-03-31: 243 mg via ORAL
  Filled 2018-03-31: qty 3

## 2018-03-31 MED ORDER — NITROGLYCERIN 0.4 MG SL SUBL: 0.4000 mg | SUBLINGUAL_TABLET | SUBLINGUAL | Status: DC | PRN

## 2018-03-31 MED ORDER — ACETAMINOPHEN 325 MG PO TABS: 650.0000 mg | ORAL_TABLET | ORAL | Status: DC | PRN

## 2018-03-31 MED ORDER — IOHEXOL 350 MG/ML SOLN
75.0000 mL | Freq: Once | INTRAVENOUS | Status: AC | PRN
  Administered 2018-03-31: 75 mL via INTRAVENOUS

## 2018-03-31 NOTE — Progress Notes (Signed)
CRITICAL VALUE ALERT  Critical Value:  Troponin 0.10 was 0.07  Date & Time Notied:  03/31/2018, 1858  Provider Notified: Dr. Jerelyn Charles  Orders Received/Actions taken: Continue to monitor.

## 2018-03-31 NOTE — ED Triage Notes (Signed)
Here for left sided intermittent chest pain that started this morning and went away and then came back just before coming to hospital.  Previous MI present similar.  Pt reports pain 8/10 at this time to left chest. Does not have NTG at home but did have ASA.  Has had some SHOB.  unlabored currently. Tachy but otherwise VSS at this time.  Pain goes into shoulder and arm.

## 2018-03-31 NOTE — ED Notes (Signed)
Pt c/o CP that has been intermittent since yesterday. Pt states hx of MI. Pt states pain reoccurred at approx 1330-1400. Pt states was sitting on the sofa when the pain happened. Pt is alert and oriented at this time, able to answer all questions. Pt states pain felt like when she had prior MI. Pt denies radiation of pain, states "recently I've been short of breath anyway". Pt denies diaphoresis during episode. Pt states pain last approx 15-20 mins.

## 2018-03-31 NOTE — Progress Notes (Signed)
Talked to Dr. Jerelyn Charles about patient's BP of 210/97, patient just receive metoprolol at 1711, order to increase Amlodipine to 10 mg, then will recheck, if BP is still high then will give IV Hydralazine. Also let MD know about patient' states she does not take carafate, protonix or pravastatin, per MD it was order to see if it will help with her symptoms. RN will continue to monitor.

## 2018-03-31 NOTE — ED Notes (Signed)
Admitting MD at bedside at this time.

## 2018-03-31 NOTE — H&P (Signed)
[facility] [place] NAME: [name] [name]    MR#:  [id number]  DATE OF BIRTH:  02/24/1941  DATE OF ADMISSION:  03/31/2018  PRIMARY CARE PHYSICIAN: Jerrol Banana., MD   REQUESTING/REFERRING PHYSICIAN:   CHIEF COMPLAINT:   Chief Complaint  Patient presents with  . Chest Pain    HISTORY OF PRESENT ILLNESS: [name] [name]  is a 78 y.o. female with a known history per below presenting to the emergency room with intermittent chest pain for 1 year, had occurred twice with the last 24 hours, located on the left side of her chest up to 15 to 20 minutes, associated with shortness of breath, pain is 8 out of 10 in terms of intensity, patient states that it feels similar to her last cardiac event several years ago, worse with deep breathing, not associated with activity, patient states that sometimes it may occur after certain foods, noted productive cough over the last 1 week without fevers/chills, in the emergency room patient was noted to be tachycardic, tachypneic, hypertensive, white count 11,000, creatinine 1.1, noted stress testing done at [facility] last month was a normal study, patient evaluated in the emergency room, family friends present, patient currently without chest pain, patient is now being admitted for acute atypical chest pain.  PAST MEDICAL HISTORY:   Past Medical History:  Diagnosis Date  . 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 Mucin noted on biopsy, not on wide excision.  . Coronary artery disease   . GERD (gastroesophageal reflux disease)   . Hyperlipidemia   . Hypertension   . Lichen planus 27/08/2374   Right breast in field of whole breast radiation. Dr. Kellie Moor DX by punch bioppsy  . Lichen planus    right breast   . MI (myocardial infarction) ([place])    2015  . Personal history of radiation therapy 09/2016   RIGHT lumpectomy w/ radiation    PAST SURGICAL HISTORY:   Past Surgical History:  Procedure Laterality Date  . ABDOMINAL HYSTERECTOMY    . APPENDECTOMY    . BREAST BIOPSY Right 07/23/2016   INVASIVE MAMMARY CARCINOMA WITH AREAS OF EXTRACELLULAR MUCIN  . BREAST EXCISIONAL BIOPSY     INVASIVE MUCINOUS MAMMARY CARCINOMA.   Marland Kitchen BREAST LUMPECTOMY Right 08/20/2016   INVASIVE MUCINOUS MAMMARY CARCINOMA. /Grade 2   . COLONOSCOPY  2016  . CORONARY ANGIOPLASTY WITH STENT PLACEMENT    . CYSTOSCOPY    . OOPHORECTOMY    . PARTIAL MASTECTOMY WITH AXILLARY SENTINEL LYMPH NODE BIOPSY Right 08/09/2016   Procedure: PARTIAL MASTECTOMY WITH AXILLARY SENTINEL LYMPH NODE BIOPSY;  Surgeon: Robert Bellow, MD;  Location: [facility] ORS;  Service: General;  Laterality: Right;  . TONSILLECTOMY      SOCIAL HISTORY:  Social History   Tobacco Use  . Smoking status: Former Smoker    Packs/day: 0.25    Years: 10.00    Pack years: 2.50    Types: Cigarettes    Last attempt to quit: 03/06/1998    Years since quitting: 20.0  . Smokeless tobacco: Never Used  Substance Use Topics  . Alcohol use: No    FAMILY HISTORY:  Family History  Problem Relation Age of Onset  . Hyperlipidemia Mother   . Allergies Mother   . Cerebral aneurysm Mother        cause of death at age 15  . Heart disease Father  Fatal MI ag 52  . Cancer Brother        lung cancer  . Hyperlipidemia Brother   . Colonic polyp Brother   . Healthy Son   . Cancer - Lung Brother        colon  . Healthy Son   . Breast cancer Neg Hx     DRUG ALLERGIES:  Allergies  Allergen Reactions  . Celebrex  [Celecoxib]     Dark stools.  . Penicillins Other (See Comments)    Childhood reaction Has patient had a PCN reaction causing immediate rash, facial/tongue/throat swelling, SOB or lightheadedness with hypotension: Unknown Has patient had a PCN reaction causing severe rash involving mucus membranes or skin necrosis: Unknown Has patient had a PCN reaction that required hospitalization: Unknown Has  patient had a PCN reaction occurring within the last 10 years: Unknown If all of the above answers are "NO", then may proceed with Cephalosporin use.   Marland Kitchen Prevacid [Lansoprazole] Diarrhea    REVIEW OF SYSTEMS:   CONSTITUTIONAL: No fever, fatigue or weakness.  EYES: No blurred or double vision.  EARS, NOSE, AND THROAT: No tinnitus or ear pain.  RESPIRATORY: + cough, shortness of breath, no wheezing or hemoptysis.  CARDIOVASCULAR: + chest pain, no orthopnea, edema.  GASTROINTESTINAL: No nausea, vomiting, diarrhea or abdominal pain.  GENITOURINARY: No dysuria, hematuria.  ENDOCRINE: No polyuria, nocturia,  HEMATOLOGY: No anemia, easy bruising or bleeding SKIN: No rash or lesion. MUSCULOSKELETAL: No joint pain or arthritis.   NEUROLOGIC: No tingling, numbness, weakness.  PSYCHIATRY: No anxiety or depression.   MEDICATIONS AT HOME:  Prior to Admission medications   Medication Sig Start Date End Date Taking? Authorizing Provider  aspirin EC 81 MG tablet Take 81 mg by mouth daily.   Yes [provider]  amLODipine (NORVASC) 5 MG tablet TAKE 1 TABLET BY MOUTH EVERY DAY Patient not taking: Reported on 01/18/2018 11/02/17   Jerrol Banana., MD  Clobetasol Prop Emollient Base 0.05 % emollient cream  05/03/17   [provider]  HYDROcodone-acetaminophen (NORCO) 7.5-325 MG tablet Take 1 tablet by mouth every 6 (six) hours as needed for pain. 03/14/18   [provider]  letrozole [facility]) 2.5 MG tablet Take 1 tablet by mouth daily 01/06/18   Lloyd Huger, MD  lisinopril (PRINIVIL,ZESTRIL) 20 MG tablet TAKE 1 TABLET BY MOUTH EVERY DAY 10/12/16   [provider]  nystatin cream (MYCOSTATIN) Apply 1 application topically 2 (two) times daily. Patient taking differently: Apply 1 application topically 2 (two) times daily. As needed 08/30/17   Jerrol Banana., MD  omeprazole (PRILOSEC) 20 MG capsule Take 20 mg by mouth daily as needed (indigestion).     [provider]  pravastatin (PRAVACHOL) 10 MG tablet Take 10 mg by mouth daily.    [provider]  sucralfate (CARAFATE) 1 g tablet Take 1 tablet (1 g total) by mouth 4 (four) times daily. Patient not taking: Reported on 01/11/2018 12/11/17   Nance Pear, MD  triamcinolone cream (KENALOG) 0.1 % APPLY TWICE DAILY TO RASH ON RIGHT BREAST UNTIL CLEAR 04/21/17   [provider]      PHYSICAL EXAMINATION:   VITAL SIGNS: Blood pressure (!) 167/75, pulse (!) 110, temperature 98.3 F (36.8 C), temperature source Oral, resp. rate 18, height 5\' 5"  (1.651 m), weight 72.6 kg, SpO2 97 %.  GENERAL:  78 y.o.-year-old patient lying in the bed with no acute distress.  EYES: Pupils equal, round, reactive to  light and accommodation. No scleral icterus. Extraocular muscles intact.  HEENT: Head atraumatic, normocephalic. Oropharynx and nasopharynx clear.  NECK:  Supple, no jugular venous distention. No thyroid enlargement, no tenderness.  LUNGS: Normal breath sounds bilaterally, no wheezing, rales,rhonchi or crepitation. No use of accessory muscles of respiration.  CARDIOVASCULAR: S1, S2 normal. No murmurs, rubs, or gallops.  ABDOMEN: Soft, nontender, nondistended. Bowel sounds present. No organomegaly or mass.  EXTREMITIES: No pedal edema, cyanosis, or clubbing.  NEUROLOGIC: Cranial nerves II through XII are intact. Muscle strength 5/5 in all extremities. Sensation intact. Gait not checked.  PSYCHIATRIC: The patient is alert and oriented x 3.  SKIN: No obvious rash, lesion, or ulcer.   LABORATORY PANEL:   CBC Recent Labs  Lab 03/31/18 1431  WBC 11.4*  HGB 14.0  HCT 43.3  PLT 271  MCV 93.1  MCH 30.1  MCHC 32.3  RDW 14.6   ------------------------------------------------------------------------------------------------------------------  Chemistries  Recent Labs  Lab 03/31/18 1431  NA 141  K 3.8  CL 107  CO2 26  GLUCOSE 170*  BUN 25*  CREATININE 1.11*   CALCIUM 9.5   ------------------------------------------------------------------------------------------------------------------ estimated creatinine clearance is 41 mL/min (A) (by C-G formula based on SCr of 1.11 mg/dL (H)). ------------------------------------------------------------------------------------------------------------------ No results for input(s): TSH, T4TOTAL, T3FREE, THYROIDAB in the last 72 hours.  Invalid input(s): FREET3   Coagulation profile No results for input(s): INR, PROTIME in the last 168 hours. ------------------------------------------------------------------------------------------------------------------- No results for input(s): DDIMER in the last 72 hours. -------------------------------------------------------------------------------------------------------------------  Cardiac Enzymes Recent Labs  Lab 03/31/18 1431  TROPONINI 0.07*   ------------------------------------------------------------------------------------------------------------------ Invalid input(s): POCBNP  ---------------------------------------------------------------------------------------------------------------  Urinalysis    Component Value Date/Time   COLORURINE YELLOW (A) 01/11/2018 1926   APPEARANCEUR CLEAR (A) 01/11/2018 1926   APPEARANCEUR Cloudy (A) 04/25/2015 1449   LABSPEC 1.023 01/11/2018 1926   LABSPEC 1.021 01/17/2014 1524   PHURINE 5.0 01/11/2018 1926   GLUCOSEU NEGATIVE 01/11/2018 1926   GLUCOSEU Negative 01/17/2014 1524   HGBUR NEGATIVE 01/11/2018 1926   BILIRUBINUR NEGATIVE 01/11/2018 1926   BILIRUBINUR neg 06/30/2016 1619   BILIRUBINUR Negative 04/25/2015 1449   BILIRUBINUR Negative 01/17/2014 1524   KETONESUR NEGATIVE 01/11/2018 1926   PROTEINUR NEGATIVE 01/11/2018 1926   UROBILINOGEN 0.2 06/30/2016 1619   NITRITE NEGATIVE 01/11/2018 1926   LEUKOCYTESUR TRACE (A) 01/11/2018 1926   LEUKOCYTESUR 2+ (A) 04/25/2015 1449   LEUKOCYTESUR 2+  01/17/2014 1524     RADIOLOGY: Dg Chest 2 View  Result Date: 03/31/2018 CLINICAL DATA:  Left-sided chest pain radiating to left shoulder. History of breast cancer. EXAM: CHEST - 2 VIEW COMPARISON:  12/11/2017 FINDINGS: Stable large hiatal hernia. Normal heart size. Right lung is clear. Subsegmental atelectasis at the left base. No pneumothorax or pleural effusion. IMPRESSION: Hiatal hernia and associated subsegmental atelectasis at the left base. Electronically Signed   By: Marybelle Killings M.D.   On: 03/31/2018 15:08    EKG: Orders placed or performed during the hospital encounter of 03/31/18  . ED EKG  . ED EKG    IMPRESSION AND PLAN: *Acute recurrent atypical chest pain with history of CAD Similar to her previous cardiac event per patient, noted history of CAD/MI, normal nuclear medicine stress testing done at [facility] last month, pain is pleuritic/worse with deep breathing, noted coughing over the last week, remote history of smoking, history of radiation to the chest for breast cancer treatment ?  Acute radiation pneumonitis versus COPD versus GERD versus costochondritis versus ischemic heart disease Admit to telemetry bed on our ACS  protocol, cardiology to see, cycle cardiac enzymes, check CT of the chest to evaluate for possible pulmonary embolism, trial of inhaled corticosteroids/breathing treatments, GI cocktail x1, PPI daily, check echocardiogram, and continue close medical monitoring  *Acute accelerated hypertension Add beta-blocker therapy to current regiment, PRN hydralazine, restart Norvasc, vitals per routine, make changes as per necessary  *Chronic GERD PPI daily  *Hyperlipidemia Statin therapy and check lipids in the morning  *History of breast cancer Stable Continue oral chemotherapy have patient follow-up with oncology status post discharge for continued medical surveillance   All the records are reviewed and case discussed with ED provider. Management plans discussed  with the patient, family and they are in agreement.  CODE STATUS:DNI Code Status History    Date Active Date Inactive Code Status Order ID Comments User Context   08/18/2014 1831 08/20/2014 1300 Full Code [id number]  Theodoro Grist, MD Inpatient       TOTAL TIME TAKING CARE OF THIS PATIENT: 40 minutes.    Avel Peace Danyle Boening M.D on 03/31/2018   Between 7am to 6pm - Pager - [phone]  After 6pm go to [email or web address] - password [email or web address] [facility]  Office  [phone]  CC: Primary care physician; Jerrol Banana., MD   Note: This dictation was prepared with Dragon dictation along with smaller phrase technology. Any transcriptional errors that result from this process are unintentional.

## 2018-03-31 NOTE — ED Provider Notes (Addendum)
[facility] Emergency Department Provider Note  ____________________________________________   First MD Initiated Contact with Patient 03/31/18 1523     (approximate)  I have reviewed the triage vital signs and the nursing notes.   HISTORY  Chief Complaint Chest Pain   HPI [name] [name] is a 78 y.o. female with a history of CAD, hypertension as well as an MI in 2015 status post stenting who is presenting to the emergency department 2 episodes of chest pain.  States that the episode lasted about 15 to 20 minutes and feeling a pressure-like pain to the left side of the chest that radiated to her armpit.  The episodes are not worsened with exertion or deep breathing and seem to come on at random.  She says that she took 81 mg of aspirin today.  Says that these are similar to the episode she had when she needed her stent several years ago.  Patient is a patient of Dr. Nehemiah Massed.  Says that she also has associated shortness of breath.  No nausea or vomiting.  No diaphoresis.  Patient is pain-free at this time.   Past Medical History:  Diagnosis Date  . 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 Mucin noted on biopsy, not on wide excision.  . Coronary artery disease   . GERD (gastroesophageal reflux disease)   . Hyperlipidemia   . Hypertension   . Lichen planus 81/44/8185   Right breast in field of whole breast radiation. Dr. Kellie Moor DX by punch bioppsy  . Lichen planus    right breast   . MI (myocardial infarction) ([place])    2015  . Personal history of radiation therapy 09/2016   RIGHT lumpectomy w/ radiation    Patient Active Problem List   Diagnosis Date Noted  . Lichen planus 63/14/9702  . Malignant neoplasm of upper-inner quadrant of right breast in female, estrogen receptor positive ([place]) 07/28/2016  . Gastric ulcer requiring drug therapy, chronic 01/23/2016  . MI (mitral incompetence) 07/22/2015    . Combined fat and carbohydrate induced hyperlipemia 12/26/2014  . UTI (lower urinary tract infection) 08/18/2014  . Blurry vision 08/18/2014  . Allergic rhinitis 07/12/2014  . Absolute anemia 07/12/2014  . Baker's cyst of knee 07/12/2014  . Atherosclerosis of coronary artery 07/12/2014  . CAFL (chronic airflow limitation) ([place]) 07/12/2014  . Dizziness 07/12/2014  . Essential (primary) hypertension 07/12/2014  . Acid reflux 07/12/2014  . 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 History of colon polyps 07/12/2014  . Hypercholesteremia 07/12/2014  . Malaise and fatigue 07/12/2014  . Heart attack ([place]) 07/12/2014  . Muscle ache 07/12/2014  . Arthritis, degenerative 07/12/2014  . Bradycardia 08/23/2013  . Arteriosclerosis of coronary artery 08/17/2013  . Diabetes ([place]) 08/17/2013  . Diabetes mellitus ([place]) 08/17/2013  . Peripheral vascular disease ([place]) 08/17/2013    Past Surgical History:  Procedure Laterality Date  . ABDOMINAL HYSTERECTOMY    . APPENDECTOMY    . BREAST BIOPSY Right 07/23/2016   INVASIVE MAMMARY CARCINOMA WITH AREAS OF EXTRACELLULAR MUCIN  . BREAST EXCISIONAL BIOPSY     INVASIVE MUCINOUS MAMMARY CARCINOMA.   Marland Kitchen BREAST LUMPECTOMY Right 08/20/2016   INVASIVE MUCINOUS MAMMARY CARCINOMA. /Grade 2   . COLONOSCOPY  2016  . CORONARY ANGIOPLASTY WITH STENT PLACEMENT    . CYSTOSCOPY    . OOPHORECTOMY    . PARTIAL MASTECTOMY WITH AXILLARY SENTINEL LYMPH NODE BIOPSY Right 08/09/2016   Procedure: PARTIAL MASTECTOMY WITH AXILLARY SENTINEL  LYMPH NODE BIOPSY;  Surgeon: Robert Bellow, MD;  Location: [facility] ORS;  Service: General;  Laterality: Right;  . TONSILLECTOMY      Prior to Admission medications   Medication Sig Start Date End Date Taking? Authorizing Provider  amLODipine (NORVASC) 5 MG tablet TAKE 1 TABLET BY MOUTH EVERY DAY Patient not taking: Reported on 01/18/2018 11/02/17   Jerrol Banana., MD  aspirin EC 81 MG tablet Take 81 mg by mouth daily.    [provider]  Clobetasol Prop Emollient Base 0.05 % emollient cream  05/03/17   [provider]  letrozole [facility] 2.5 MG tablet Take 1 tablet by mouth daily 01/06/18   Lloyd Huger, MD  lisinopril (PRINIVIL,ZESTRIL) 20 MG tablet TAKE 1 TABLET BY MOUTH EVERY DAY 10/12/16   [provider]  nystatin cream (MYCOSTATIN) Apply 1 application topically 2 (two) times daily. Patient taking differently: Apply 1 application topically 2 (two) times daily. As needed 08/30/17   Jerrol Banana., MD  omeprazole (PRILOSEC) 20 MG capsule Take 20 mg by mouth daily as needed (indigestion).    [provider]  pravastatin (PRAVACHOL) 10 MG tablet Take 10 mg by mouth daily.    [provider]  sucralfate (CARAFATE) 1 g tablet Take 1 tablet (1 g total) by mouth 4 (four) times daily. Patient not taking: Reported on 01/11/2018 12/11/17   Nance Pear, MD  triamcinolone cream (KENALOG) 0.1 % APPLY TWICE DAILY TO RASH ON RIGHT BREAST UNTIL CLEAR 04/21/17   [provider]    Allergies Celebrex  [celecoxib]; Penicillins; and Prevacid [lansoprazole]  Family History  Problem Relation Age of Onset  . Hyperlipidemia Mother   . Allergies Mother   . Cerebral aneurysm Mother        cause of death at age 99  . Heart disease Father        Fatal MI ag 26  . Cancer Brother        lung cancer  . Hyperlipidemia Brother   . Colonic polyp Brother   . Healthy Son   . Cancer - Lung Brother        colon  . Healthy Son   . Breast cancer Neg Hx     Social History Social History   Tobacco Use  . Smoking status: Former Smoker    Packs/day: 0.25    Years: 10.00    Pack years: 2.50    Types: Cigarettes    Last attempt to quit: 03/06/1998    Years since quitting: 20.0  . Smokeless tobacco: Never Used  Substance Use Topics  . Alcohol use: No  . Drug use: No    Review of Systems  Constitutional: No fever/chills Eyes: No visual changes. ENT: No sore  throat. Cardiovascular: As above Respiratory: As above Gastrointestinal: No abdominal pain.  No nausea, no vomiting.  No diarrhea.  No constipation. Genitourinary: Negative for dysuria. Musculoskeletal: Negative for back pain. Skin: Negative for rash. Neurological: Negative for headaches, focal weakness or numbness.   ____________________________________________   PHYSICAL EXAM:  VITAL SIGNS: ED Triage Vitals  Enc Vitals Group     BP 03/31/18 1422 (!) 167/75     Pulse Rate 03/31/18 1422 (!) 110     Resp 03/31/18 1422 18     Temp 03/31/18 1422 98.3 F (36.8 C)     Temp Source 03/31/18 1422 Oral     SpO2 03/31/18 1422 97 %     Weight 03/31/18 1424 160  lb (72.6 kg)     Height 03/31/18 1424 5\' 5"  (1.651 m)     Head Circumference --      Peak Flow --      Pain Score 03/31/18 1425 8     Pain Loc --      Pain Edu? --      Excl. in [place]? --     Constitutional: Alert and oriented. Well appearing and in no acute distress. Eyes: Conjunctivae are normal.  Head: Atraumatic. Nose: No congestion/rhinnorhea. Mouth/Throat: Mucous membranes are moist.  Neck: No stridor.   Cardiovascular: Tachycardic, regular rhythm. Grossly normal heart sounds.  Respiratory: Normal respiratory effort.  No retractions. Lungs CTAB. Gastrointestinal: Soft and nontender. No distention.  Musculoskeletal: No lower extremity tenderness nor edema.  No joint effusions. Neurologic:  Normal speech and language. No gross focal neurologic deficits are appreciated. Skin:  Skin is warm, dry and intact. No rash noted. Psychiatric: Mood and affect are normal. Speech and behavior are normal.  ____________________________________________   LABS (all labs ordered are listed, but only abnormal results are displayed)  Labs Reviewed  BASIC METABOLIC PANEL - Abnormal; Notable for the following components:      Result Value   Glucose, Bld 170 (*)    BUN 25 (*)    Creatinine, Ser 1.11 (*)    GFR calc non Af Amer 47 (*)     GFR calc Af Amer 55 (*)    All other components within normal limits  CBC - Abnormal; Notable for the following components:   WBC 11.4 (*)    All other components within normal limits  TROPONIN I - Abnormal; Notable for the following components:   Troponin I 0.07 (*)    All other components within normal limits   ____________________________________________  EKG  ED ECG REPORT I, Doran Stabler, the attending physician, personally viewed and interpreted this ECG.   Date: 03/31/2018  EKG Time: 1428  Rate: 109  Rhythm: sinus tachycardia  Axis: Normal  Intervals:none  ST&T Change: No ST segment elevation or depression.  No abnormal T wave inversion.  ____________________________________________  RADIOLOGY  Chest x-ray with hiatal hernia and associated subsegmental atelectasis. ____________________________________________   PROCEDURES  Procedure(s) performed:   Procedures  Critical Care performed:   ____________________________________________   INITIAL IMPRESSION / ASSESSMENT AND PLAN / ED COURSE  Pertinent labs & imaging results that were available during my care of the patient were reviewed by me and considered in my medical decision making (see chart for details).  Differential diagnosis includes, but is not limited to, ACS, aortic dissection, pulmonary embolism, cardiac tamponade, pneumothorax, pneumonia, pericarditis, myocarditis, GI-related causes including esophagitis/gastritis, and musculoskeletal chest wall pain.   As part of my medical decision making, I reviewed the following data within the electronic MEDICAL RECORD NUMBER Notes from prior ED visits  Patient with high risk chest pain.  Usually with a normal troponin.  Will be admitted to the hospital for further evaluation.  We will give 243 mg of additional aspirin.  Hold heparin at this time as the patient is pain-free with only an elevated troponin of 0.07.  Patient aware of need for admission to the  hospital.  Signed out to Dr. Jerelyn Charles. ____________________________________________   FINAL CLINICAL IMPRESSION(S) / ED DIAGNOSES  Chest pain  NEW MEDICATIONS STARTED DURING THIS VISIT:  New Prescriptions   No medications on file     Note:  This document was prepared using Dragon voice recognition software and may include unintentional  dictation errors.     Orbie Pyo, MD 03/31/18 1545  ED ECG REPORT I, Doran Stabler, the attending physician, personally viewed and interpreted this ECG.   Date: 03/31/2018  EKG Time: 1635  Rate: 102  Rhythm: sinus tachycardia  Axis: Normal  Intervals:Prolonged QT interval at 374  ST&T Change: No ST segment elevation or depression.  No abnormal T wave inversion.     Orbie Pyo, MD 03/31/18 [phone]

## 2018-03-31 NOTE — Progress Notes (Signed)
Family Meeting Note  Advance Directive:yes  Today a meeting took place with the Patient.  Patient is able to participate   The following clinical team members were present during this meeting:MD  The following were discussed:Patient's diagnosis: Chest pain, GERD, coronary artery disease, hypertension, history of breast cancer, hyperlipidemia, Patient's progosis: Unable to determine and Goals for treatment: DNI  Additional follow-up to be provided: prn  Time spent during discussion:20 minutes  Gorden Harms, MD

## 2018-04-01 ENCOUNTER — Other Ambulatory Visit: Payer: Self-pay

## 2018-04-01 DIAGNOSIS — C50911 Malignant neoplasm of unspecified site of right female breast: Secondary | ICD-10-CM

## 2018-04-01 DIAGNOSIS — R0789 Other chest pain: Secondary | ICD-10-CM

## 2018-04-01 DIAGNOSIS — I2510 Atherosclerotic heart disease of native coronary artery without angina pectoris: Secondary | ICD-10-CM | POA: Diagnosis not present

## 2018-04-01 DIAGNOSIS — K219 Gastro-esophageal reflux disease without esophagitis: Secondary | ICD-10-CM | POA: Diagnosis not present

## 2018-04-01 DIAGNOSIS — I209 Angina pectoris, unspecified: Secondary | ICD-10-CM | POA: Diagnosis not present

## 2018-04-01 DIAGNOSIS — I252 Old myocardial infarction: Secondary | ICD-10-CM | POA: Diagnosis not present

## 2018-04-01 DIAGNOSIS — R079 Chest pain, unspecified: Secondary | ICD-10-CM | POA: Diagnosis not present

## 2018-04-01 DIAGNOSIS — I10 Essential (primary) hypertension: Secondary | ICD-10-CM | POA: Diagnosis not present

## 2018-04-01 LAB — HEPATIC FUNCTION PANEL
ALT: 12 U/L (ref 0–44)
AST: 19 U/L (ref 15–41)
Albumin: 3.5 g/dL (ref 3.5–5.0)
Alkaline Phosphatase: 55 U/L (ref 38–126)
Bilirubin, Direct: <0.1 mg/dL (ref 0.0–0.2)
Total Bilirubin: 0.8 mg/dL (ref 0.3–1.2)
Total Protein: 6.5 g/dL (ref 6.5–8.1)

## 2018-04-01 LAB — BASIC METABOLIC PANEL
Anion gap: 7 (ref 5–15)
BUN: 24 mg/dL — ABNORMAL HIGH (ref 8–23)
CO2: 27 mmol/L (ref 22–32)
Calcium: 9 mg/dL (ref 8.9–10.3)
Chloride: 104 mmol/L (ref 98–111)
Creatinine, Ser: 0.96 mg/dL (ref 0.44–1.00)
GFR, EST NON AFRICAN AMERICAN: 56 mL/min — AB (ref >60)
Glucose, Bld: 144 mg/dL — ABNORMAL HIGH (ref 70–99)
Potassium: 3.6 mmol/L (ref 3.5–5.1)
Sodium: 138 mmol/L (ref 135–145)

## 2018-04-01 LAB — CBC
HCT: 43.3 % (ref 36.0–46.0)
Hemoglobin: 13.8 g/dL (ref 12.0–15.0)
MCH: 29.4 pg (ref 26.0–34.0)
MCHC: 31.9 g/dL (ref 30.0–36.0)
MCV: 92.1 fL (ref 80.0–100.0)
Platelets: 253 10*3/uL (ref 150–400)
RBC: 4.7 MIL/uL (ref 3.87–5.11)
RDW: 15 % (ref 11.5–15.5)
WBC: 10 10*3/uL (ref 4.0–10.5)
nRBC: 0 % (ref 0.0–0.2)

## 2018-04-01 MED ORDER — IPRATROPIUM-ALBUTEROL 0.5-2.5 (3) MG/3ML IN SOLN: 3.0000 mL | Freq: Four times a day (QID) | RESPIRATORY_TRACT | Status: DC | PRN

## 2018-04-01 MED ORDER — DOXYCYCLINE HYCLATE 100 MG PO TABS
100.0000 mg | ORAL_TABLET | Freq: Two times a day (BID) | ORAL | Status: DC
  Administered 2018-04-01: 100 mg via ORAL
  Filled 2018-04-01: qty 1

## 2018-04-01 MED ORDER — METOPROLOL TARTRATE 37.5 MG PO TABS: 37.5000 mg | ORAL_TABLET | Freq: Two times a day (BID) | ORAL | 0 refills | Status: DC

## 2018-04-01 NOTE — Progress Notes (Signed)
[place] [facility] [place] NAME: [name] [name]    MR#:  [id number]  DATE OF BIRTH:  03-Sep-1940  SUBJECTIVE:   Patient states she is doing well this morning.  Her chest pain has resolved.  She denies any shortness of breath or palpitations.  REVIEW OF SYSTEMS:  Review of Systems  Constitutional: Negative for chills and fever.  HENT: Negative for congestion and sore throat.   Eyes: Negative for blurred vision and double vision.  Respiratory: Negative for cough and shortness of breath.   Cardiovascular: Negative for chest pain and palpitations.  Gastrointestinal: Negative for nausea and vomiting.  Genitourinary: Negative for dysuria and urgency.  Musculoskeletal: Negative for back pain and neck pain.  Neurological: Negative for dizziness and headaches.  Psychiatric/Behavioral: Negative for depression. The patient is not nervous/anxious.     DRUG ALLERGIES:   Allergies  Allergen Reactions  . Celebrex  [Celecoxib]     Dark stools.  . Penicillins Other (See Comments)    Childhood reaction Has patient had a PCN reaction causing immediate rash, facial/tongue/throat swelling, SOB or lightheadedness with hypotension: Unknown Has patient had a PCN reaction causing severe rash involving mucus membranes or skin necrosis: Unknown Has patient had a PCN reaction that required hospitalization: Unknown Has patient had a PCN reaction occurring within the last 10 years: Unknown If all of the above answers are "NO", then may proceed with Cephalosporin use.   Marland Kitchen Prevacid [Lansoprazole] Diarrhea   VITALS:  Blood pressure 140/86, pulse 80, temperature 98.4 F (36.9 C), temperature source Oral, resp. rate 18, height 5\' 4"  (1.626 m), weight 65.2 kg, SpO2 100 %. PHYSICAL EXAMINATION:  Physical Exam  GENERAL:  78 y.o.-year-old patient lying in the bed with no acute distress.  EYES: Pupils equal, round, reactive to light and accommodation. No scleral icterus.  Extraocular muscles intact.  HEENT: Head atraumatic, normocephalic. Oropharynx and nasopharynx clear.  NECK:  Supple, no jugular venous distention. No thyroid enlargement, no tenderness.  LUNGS: Normal breath sounds bilaterally, no wheezing, rales,rhonchi or crepitation. No use of accessory muscles of respiration.  CARDIOVASCULAR: RRR, S1, S2 normal. No murmurs, rubs, or gallops.  ABDOMEN: Soft, nontender, nondistended. Bowel sounds present. No organomegaly or mass.  EXTREMITIES: No pedal edema, cyanosis, or clubbing.  NEUROLOGIC: Cranial nerves II through XII are intact. Muscle strength 5/5 in all extremities. Sensation intact. Gait not checked.  PSYCHIATRIC: The patient is alert and oriented x 3.  SKIN: No obvious rash, lesion, or ulcer.   LABORATORY PANEL:  Female CBC Recent Labs  Lab 04/01/18 0937  WBC 10.0  HGB 13.8  HCT 43.3  PLT 253   ------------------------------------------------------------------------------------------------------------------ Chemistries  Recent Labs  Lab 04/01/18 0513 04/01/18 0937  NA  --  138  K  --  3.6  CL  --  104  CO2  --  27  GLUCOSE  --  144*  BUN  --  24*  CREATININE  --  0.96  CALCIUM  --  9.0  AST 19  --   ALT 12  --   ALKPHOS 55  --   BILITOT 0.8  --    RADIOLOGY:  Dg Chest 2 View  Result Date: 03/31/2018 CLINICAL DATA:  Left-sided chest pain radiating to left shoulder. History of breast cancer. EXAM: CHEST - 2 VIEW COMPARISON:  12/11/2017 FINDINGS: Stable large hiatal hernia. Normal heart size. Right lung is clear. Subsegmental atelectasis at the left base. No pneumothorax or pleural effusion. IMPRESSION: Hiatal  hernia and associated subsegmental atelectasis at the left base. Electronically Signed   By: Marybelle Killings M.D.   On: 03/31/2018 15:08   Ct Angio Chest Pe W And/or Wo Contrast  Result Date: 03/31/2018 CLINICAL DATA:  Chest pain this morning EXAM: CT ANGIOGRAPHY CHEST WITH CONTRAST TECHNIQUE: Multidetector CT imaging of  the chest was performed using the standard protocol during bolus administration of intravenous contrast. Multiplanar CT image reconstructions and MIPs were obtained to evaluate the vascular anatomy. CONTRAST:  2mL OMNIPAQUE IOHEXOL 350 MG/ML SOLN COMPARISON:  None. FINDINGS: Cardiovascular: There are no filling defects in the pulmonary arterial tree to suggest acute pulmonary thromboembolism. There is no obvious evidence of aortic dissection or aortic aneurysm. Great vessels are patent. Moderate 3 vessel coronary artery calcifications. Mediastinum/Nodes: No abnormal mediastinal adenopathy. No pericardial effusion. Thyroid is unremarkable. A large hiatal hernia contains nearly the entire stomach. Lungs/Pleura: Dependent atelectasis at the left base. No pneumothorax or pleural effusion. No spiculated lung mass or consolidation. Upper Abdomen: 5.2 cm nonspecific mass in the left lobe of the liver on image 78 is stable. This is compared to a prior abdomen CT dated 01/21/2015. On that study, this area fill to enhance. Other benign appearing cysts within the liver are again noted and are not significantly changed. Musculoskeletal: No vertebral compression deformity. There is patchy sclerosis throughout the entire thoracic and visualized lumbar spine. No focal destructive lesion is identified. Review of the MIP images confirms the above findings. IMPRESSION: No evidence of acute pulmonary thromboembolism. Large hiatal hernia contains most of the stomach. There is associated atelectasis at the left lung base. Stable benign findings in the abdomen. There is patchy sclerosis throughout the visualized thoracic and lumbar spine. This may simply represent a variation of the patient's vertebrae and bone marrow, however given the history of breast cancer, sclerotic metastatic disease throughout the spine is not excluded. Consider bone scan to further delineate. Aortic Atherosclerosis (ICD10-I70.0). Electronically Signed   By:  Marybelle Killings M.D.   On: 03/31/2018 16:43   ASSESSMENT AND PLAN:   Atypical chest pain with history of CAD- due to acute radiation pneumonitis versus COPD versus GERD versus costochondritis versus ischemic heart disease.  Chest pain resolved this morning. 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  May be a variation of the patient's vertebra versus sclerotic metastatic disease -Oncology consult  Hypertension- BPs have improved -Continue home Norvasc and lisinopril -Metoprolol all started this admission -IV hydralazine PRN  Chronic GERD -PPI daily  Hyperlipidemia -Continue statin  History of breast cancer- stable -Continue oral chemotherapy  All the records are reviewed and case discussed with Care Management/Social Worker. Management plans discussed with the patient, family and they are in agreement.  CODE STATUS: Partial Code  TOTAL TIME TAKING CARE OF THIS PATIENT: 40 minutes.   More than 50% of the time was spent in counseling/coordination of care: YES  POSSIBLE D/C IN 1-2 DAYS, DEPENDING ON CLINICAL CONDITION.   Berna Spare Ariyan Sinnett M.D on 04/01/2018 at 1:40 PM  Between 7am to 6pm - Pager - [phone]  After 6pm go to [email or web address] - password [email or web address] [facility]  [employer] [facility]  Office  [phone]  CC: Primary care physician; Jerrol Banana., MD  Note: This dictation was prepared with Dragon dictation along with smaller phrase technology. Any transcriptional errors that result from this process are unintentional.

## 2018-04-01 NOTE — Care Management Obs Status (Signed)
[place] NOTIFICATION   Patient Details  Name: [name] [name] MRN: [id number] Date of Birth: 1940/06/28   Medicare Observation Status Notification Given:  Yes    Elza Rafter, RN 04/01/2018, 3:28 PM

## 2018-04-01 NOTE — Consult Note (Signed)
Reason for Consult: Chest pain angina Referring Physician: Dr. Miguel Aschoff primary Dr. Clare Charon salary hospitalist Cardiologist Dr. Lovette Cliche [name] is an 78 y.o. [name].  HPI: 78 year old black [name] known history of coronary disease history of PCI and stent in the past recently had chest pain symptoms underwent functional study at [facility] about a month ago which was read to be unremarkable patient continued to have chest pain with many atypical features also complained of reflux symptoms still patient presented for further assessment evaluation known history of breast cancer hypertension hyperlipidemia as well  Past Medical History:  Diagnosis Date  . 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 Mucin noted on biopsy, not on wide excision.  . Coronary artery disease   . GERD (gastroesophageal reflux disease)   . Hyperlipidemia   . Hypertension   . Lichen planus 13/10/6576   Right breast in field of whole breast radiation. Dr. Kellie Moor DX by punch bioppsy  . Lichen planus    right breast   . MI (myocardial infarction) ([place])    2015  . Personal history of radiation therapy 09/2016   RIGHT lumpectomy w/ radiation    Past Surgical History:  Procedure Laterality Date  . ABDOMINAL HYSTERECTOMY    . APPENDECTOMY    . BREAST BIOPSY Right 07/23/2016   INVASIVE MAMMARY CARCINOMA WITH AREAS OF EXTRACELLULAR MUCIN  . BREAST EXCISIONAL BIOPSY     INVASIVE MUCINOUS MAMMARY CARCINOMA.   Marland Kitchen BREAST LUMPECTOMY Right 08/20/2016   INVASIVE MUCINOUS MAMMARY CARCINOMA. /Grade 2   . COLONOSCOPY  2016  . CORONARY ANGIOPLASTY WITH STENT PLACEMENT    . CYSTOSCOPY    . OOPHORECTOMY    . PARTIAL MASTECTOMY WITH AXILLARY SENTINEL LYMPH NODE BIOPSY Right 08/09/2016   Procedure: PARTIAL MASTECTOMY WITH AXILLARY SENTINEL LYMPH NODE BIOPSY;  Surgeon: Robert Bellow, MD;  Location: [facility] ORS;  Service: General;  Laterality: Right;  . TONSILLECTOMY       Family History  Problem Relation Age of Onset  . Hyperlipidemia Mother   . Allergies Mother   . Cerebral aneurysm Mother        cause of death at age 52  . Heart disease Father        Fatal MI ag 31  . Cancer Brother        lung cancer  . Hyperlipidemia Brother   . Colonic polyp Brother   . Healthy Son   . Cancer - Lung Brother        colon  . Healthy Son   . Breast cancer Neg Hx     Social History:  reports that she quit smoking about 20 years ago. Her smoking use included cigarettes. She has a 2.50 pack-year smoking history. She has never used smokeless tobacco. She reports that she does not drink alcohol or use drugs.  Allergies:  Allergies  Allergen Reactions  . Celebrex  [Celecoxib]     Dark stools.  . Penicillins Other (See Comments)    Childhood reaction Has patient had a PCN reaction causing immediate rash, facial/tongue/throat swelling, SOB or lightheadedness with hypotension: Unknown Has patient had a PCN reaction causing severe rash involving mucus membranes or skin necrosis: Unknown Has patient had a PCN reaction that required hospitalization: Unknown Has patient had a PCN reaction occurring within the last 10 years: Unknown If all of the above answers are "NO", then may proceed with Cephalosporin use.   Marland Kitchen Prevacid [Lansoprazole] Diarrhea  Medications: I have reviewed the patient's current medications.  Results for orders placed or performed during the hospital encounter of 03/31/18 (from the past 48 hour(s))  Basic metabolic panel     Status: Abnormal   Collection Time: 03/31/18  2:31 PM  Result Value Ref Range   Sodium 141 135 - 145 mmol/L   Potassium 3.8 3.5 - 5.1 mmol/L   Chloride 107 98 - 111 mmol/L   CO2 26 22 - 32 mmol/L   Glucose, Bld 170 (H) 70 - 99 mg/dL   BUN 25 (H) 8 - 23 mg/dL   Creatinine, Ser 1.11 (H) 0.44 - 1.00 mg/dL   Calcium 9.5 8.9 - 10.3 mg/dL   GFR calc non Af Amer 47 (L) >60 mL/min   GFR calc Af Amer 55 (L) >60 mL/min    Anion gap 8 5 - 15    Comment: Performed at [facility], [address] [place], [place] [address]  CBC     Status: Abnormal   Collection Time: 03/31/18  2:31 PM  Result Value Ref Range   WBC 11.4 (H) 4.0 - 10.5 K/uL   RBC 4.65 3.87 - 5.11 MIL/uL   Hemoglobin 14.0 12.0 - 15.0 g/dL   HCT 43.3 36.0 - 46.0 %   MCV 93.1 80.0 - 100.0 fL   MCH 30.1 26.0 - 34.0 pg   MCHC 32.3 30.0 - 36.0 g/dL   RDW 14.6 11.5 - 15.5 %   Platelets 271 150 - 400 K/uL   nRBC 0.0 0.0 - 0.2 %    Comment: Performed at [facility], [address] [place], [place] [address]  Troponin I - ONCE - STAT     Status: Abnormal   Collection Time: 03/31/18  2:31 PM  Result Value Ref Range   Troponin I 0.07 (HH) <0.03 ng/mL    Comment: CRITICAL RESULT CALLED TO, READ BACK BY AND VERIFIED WITH AMBER PAYNE @ 1511 03/31/2018 SMA/PMF Performed at [facility], [address] [place], [place] [address]   Troponin I - Now Then [facility]     Status: Abnormal   Collection Time: 03/31/18  5:41 PM  Result Value Ref Range   Troponin I 0.10 (HH) <0.03 ng/mL    Comment: CRITICAL VALUE NOTED. VALUE IS CONSISTENT WITH PREVIOUSLY REPORTED/CALLED VALUE.PMF Performed at [facility], [address] [place], [place] [address]   Troponin I - Now Then [facility]     Status: Abnormal   Collection Time: 03/31/18  8:17 PM  Result Value Ref Range   Troponin I 0.12 (HH) <0.03 ng/mL    Comment: CRITICAL VALUE NOTED. VALUE IS CONSISTENT WITH PREVIOUSLY REPORTED/CALLED VALUE / [place] Performed at [facility], [address] [place], [place] [address]   Troponin I - Now Then [facility]     Status: Abnormal   Collection Time: 03/31/18 11:10 PM  Result Value Ref Range   Troponin I 0.11 (HH) <0.03 ng/mL    Comment: CRITICAL VALUE NOTED. VALUE IS CONSISTENT WITH PREVIOUSLY REPORTED/CALLED VALUE / [place] Performed at [facility], [address] [place], [place] [address]   Hepatic function panel      Status: None   Collection Time: 04/01/18  5:13 AM  Result Value Ref Range   Total Protein 6.5 6.5 - 8.1 g/dL   Albumin 3.5 3.5 - 5.0 g/dL   AST 19 15 - 41 U/L   ALT 12 0 - 44 U/L   Alkaline Phosphatase 55 38 - 126  U/L   Total Bilirubin 0.8 0.3 - 1.2 mg/dL   Bilirubin, Direct <0.1 0.0 - 0.2 mg/dL   Indirect Bilirubin NOT CALCULATED 0.3 - 0.9 mg/dL    Comment: Performed at [facility], [address] [place], [place] [address]  CBC     Status: None   Collection Time: 04/01/18  9:37 AM  Result Value Ref Range   WBC 10.0 4.0 - 10.5 K/uL   RBC 4.70 3.87 - 5.11 MIL/uL   Hemoglobin 13.8 12.0 - 15.0 g/dL   HCT 43.3 36.0 - 46.0 %   MCV 92.1 80.0 - 100.0 fL   MCH 29.4 26.0 - 34.0 pg   MCHC 31.9 30.0 - 36.0 g/dL   RDW 15.0 11.5 - 15.5 %   Platelets 253 150 - 400 K/uL   nRBC 0.0 0.0 - 0.2 %    Comment: Performed at [facility], [address] [place], [place] [address]  Basic metabolic panel     Status: Abnormal   Collection Time: 04/01/18  9:37 AM  Result Value Ref Range   Sodium 138 135 - 145 mmol/L   Potassium 3.6 3.5 - 5.1 mmol/L   Chloride 104 98 - 111 mmol/L   CO2 27 22 - 32 mmol/L   Glucose, Bld 144 (H) 70 - 99 mg/dL   BUN 24 (H) 8 - 23 mg/dL   Creatinine, Ser 0.96 0.44 - 1.00 mg/dL   Calcium 9.0 8.9 - 10.3 mg/dL   GFR calc non Af Amer 56 (L) >60 mL/min   GFR calc Af Amer >60 >60 mL/min   Anion gap 7 5 - 15    Comment: Performed at [facility], [address], [place], [place] [address]    Dg Chest 2 View  Result Date: 03/31/2018 CLINICAL DATA:  Left-sided chest pain radiating to left shoulder. History of breast cancer. EXAM: CHEST - 2 VIEW COMPARISON:  12/11/2017 FINDINGS: Stable large hiatal hernia. Normal heart size. Right lung is clear. Subsegmental atelectasis at the left base. No pneumothorax or pleural effusion. IMPRESSION: Hiatal hernia and associated subsegmental atelectasis at the left base. Electronically Signed   By: Marybelle Killings  M.D.   On: 03/31/2018 15:08   Ct Angio Chest Pe W And/or Wo Contrast  Result Date: 03/31/2018 CLINICAL DATA:  Chest pain this morning EXAM: CT ANGIOGRAPHY CHEST WITH CONTRAST TECHNIQUE: Multidetector CT imaging of the chest was performed using the standard protocol during bolus administration of intravenous contrast. Multiplanar CT image reconstructions and MIPs were obtained to evaluate the vascular anatomy. CONTRAST:  14mL OMNIPAQUE IOHEXOL 350 MG/ML SOLN COMPARISON:  None. FINDINGS: Cardiovascular: There are no filling defects in the pulmonary arterial tree to suggest acute pulmonary thromboembolism. There is no obvious evidence of aortic dissection or aortic aneurysm. Great vessels are patent. Moderate 3 vessel coronary artery calcifications. Mediastinum/Nodes: No abnormal mediastinal adenopathy. No pericardial effusion. Thyroid is unremarkable. A large hiatal hernia contains nearly the entire stomach. Lungs/Pleura: Dependent atelectasis at the left base. No pneumothorax or pleural effusion. No spiculated lung mass or consolidation. Upper Abdomen: 5.2 cm nonspecific mass in the left lobe of the liver on image 78 is stable. This is compared to a prior abdomen CT dated 01/21/2015. On that study, this area fill to enhance. Other benign appearing cysts within the liver are again noted and are not significantly changed. Musculoskeletal: No vertebral compression deformity. There is patchy sclerosis throughout the entire thoracic and visualized lumbar spine. No focal destructive lesion is identified. Review  of the MIP images confirms the above findings. IMPRESSION: No evidence of acute pulmonary thromboembolism. Large hiatal hernia contains most of the stomach. There is associated atelectasis at the left lung base. Stable benign findings in the abdomen. There is patchy sclerosis throughout the visualized thoracic and lumbar spine. This may simply represent a variation of the patient's vertebrae and bone marrow,  however given the history of breast cancer, sclerotic metastatic disease throughout the spine is not excluded. Consider bone scan to further delineate. Aortic Atherosclerosis (ICD10-I70.0). Electronically Signed   By: Marybelle Killings M.D.   On: 03/31/2018 16:43    Review of Systems  Constitutional: Positive for malaise/fatigue.  HENT: Negative.   Eyes: Negative.   Respiratory: Positive for shortness of breath.   Cardiovascular: Positive for chest pain.  Gastrointestinal: Positive for heartburn.  Genitourinary: Negative.   Musculoskeletal: Negative.   Skin: Negative.   Neurological: Negative.   Endo/Heme/Allergies: Negative.   Psychiatric/Behavioral: Negative.    Blood pressure 131/68, pulse 71, temperature 98.4 F (36.9 C), temperature source Oral, resp. rate 18, height 5\' 4"  (1.626 m), weight 65.2 kg, SpO2 99 %. Physical Exam  Nursing note and vitals reviewed. Constitutional: She is oriented to person, place, and time. She appears well-developed and well-nourished.  HENT:  Head: Normocephalic and atraumatic.  Eyes: Pupils are equal, round, and reactive to light. Conjunctivae and EOM are normal.  Neck: Normal range of motion. Neck supple.  Cardiovascular: Normal rate, regular rhythm and normal heart sounds.  Respiratory: Effort normal and breath sounds normal.  GI: Soft. Bowel sounds are normal.  Musculoskeletal: Normal range of motion.  Neurological: She is alert and oriented to person, place, and time. She has normal reflexes.  Skin: Skin is warm and dry.  Psychiatric: She has a normal mood and affect.    Assessment/Plan: Known coronary disease Chest pain Possible angina GERD Hypertension Hyperlipidemia History of breast cancer Borderline troponins . Plan Agree with admission Rule out for myocardial infarction Follow-up troponins and EKGs Consider outpatient functional study versus cardiac cath Treat medically for now   [name] [name] 04/01/2018, 7:27 PM

## 2018-04-01 NOTE — Consult Note (Signed)
[facility]  Date of admission:  03/31/2018  Inpatient day:  04/01/2018  Consulting physician:  Dr. Holly Bodily Salary  Reason for Consultation:  ? Recurrence of breast cancer to spine, abnormal CT chest  Chief Complaint: [name] [name] is a 78 y.o. female with a history of stage IA right breast cancer who was admitted with atypical chest pain.  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  She is status post lumpectomy on 08/09/2016 followed by adjuvant radiation completed in 10/2016.  She has been on letrozole.  She was last seen in the medical oncology clinic on 01/11/2018 by Dr. Grayland Ormond.  At that time, she was doing well without evidence of recurrent disease.  The patient notes a history of coronary artery disease that is post myocardial infarction in 2015.  She states that she experienced similar chest pain ovber the past 2 days.  Left sided chest pain lasted 10 to 20 minutes on 03/30/2018 and 03/31/2018.  She thus presented to the emergency room.  As part of her work-up, she underwent chest CT angiogram on 03/31/2018.  Imaging revealed no evidence of pulmonary embolism.  She has a large hiatal hernia and atelectasis of the left lung base.  There was patchy sclerosis throughout the visualized thoracic and lumbar spine possibly representing a variation of the patient's vertebrae and bone marrow.  Sclerotic metastatic disease throughout the spine was not excluded.  Symptomatically, the patient denies any further chest pain.  She denies any back pain or other pain.  She denies any breast concerns.  Past Medical History:  Diagnosis Date  . Cancer ([place]) 07/23/2016   T1b, N0; ER/PR+, 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 Mucin noted on biopsy, not on wide excision.  . Coronary artery disease   . GERD (gastroesophageal reflux disease)   . Hyperlipidemia   . Hypertension   . Lichen planus 70/35/0093   Right  breast in field of whole breast radiation. Dr. Kellie Moor DX by punch bioppsy  . Lichen planus    right breast   . MI (myocardial infarction) ([place])    2015  . Personal history of radiation therapy 09/2016   RIGHT lumpectomy w/ radiation    Past Surgical History:  Procedure Laterality Date  . ABDOMINAL HYSTERECTOMY    . APPENDECTOMY    . BREAST BIOPSY Right 07/23/2016   INVASIVE MAMMARY CARCINOMA WITH AREAS OF EXTRACELLULAR MUCIN  . BREAST EXCISIONAL BIOPSY     INVASIVE MUCINOUS MAMMARY CARCINOMA.   Marland Kitchen BREAST LUMPECTOMY Right 08/20/2016   INVASIVE MUCINOUS MAMMARY CARCINOMA. /Grade 2   . COLONOSCOPY  2016  . CORONARY ANGIOPLASTY WITH STENT PLACEMENT    . CYSTOSCOPY    . OOPHORECTOMY    . PARTIAL MASTECTOMY WITH AXILLARY SENTINEL LYMPH NODE BIOPSY Right 08/09/2016   Procedure: PARTIAL MASTECTOMY WITH AXILLARY SENTINEL LYMPH NODE BIOPSY;  Surgeon: Robert Bellow, MD;  Location: [facility] ORS;  Service: General;  Laterality: Right;  . TONSILLECTOMY      Family History  Problem Relation Age of Onset  . Hyperlipidemia Mother   . Allergies Mother   . Cerebral aneurysm Mother        cause of death at age 45  . Heart disease Father        Fatal MI ag 34  . Cancer Brother        lung cancer  . Hyperlipidemia Brother   . Colonic polyp Brother   . Healthy Son   .  Cancer - Lung Brother        colon  . Healthy Son   . Breast cancer Neg Hx     Social History:  reports that she quit smoking about 20 years ago. Her smoking use included cigarettes. She has a 2.50 pack-year smoking history. She has never used smokeless tobacco. She reports that she does not drink alcohol or use drugs.   She is alone today.  Allergies:  Allergies  Allergen Reactions  . Celebrex  [Celecoxib]     Dark stools.  . Penicillins Other (See Comments)    Childhood reaction Has patient had a PCN reaction causing immediate rash, facial/tongue/throat swelling, SOB or lightheadedness with hypotension: Unknown Has  patient had a PCN reaction causing severe rash involving mucus membranes or skin necrosis: Unknown Has patient had a PCN reaction that required hospitalization: Unknown Has patient had a PCN reaction occurring within the last 10 years: Unknown If all of the above answers are "NO", then may proceed with Cephalosporin use.   Marland Kitchen Prevacid [Lansoprazole] Diarrhea    Medications Prior to Admission  Medication Sig Dispense Refill  . aspirin EC 81 MG tablet Take 81 mg by mouth daily.    Marland Kitchen lisinopril (PRINIVIL,ZESTRIL) 20 MG tablet TAKE 1 TABLET BY MOUTH EVERY DAY    . omeprazole (PRILOSEC) 20 MG capsule Take 20 mg by mouth daily as needed (indigestion).    . pravastatin (PRAVACHOL) 10 MG tablet Take 10 mg by mouth daily.    Marland Kitchen triamcinolone cream (KENALOG) 0.1 % APPLY TWICE DAILY TO RASH ON RIGHT BREAST UNTIL CLEAR  0  . amLODipine (NORVASC) 5 MG tablet TAKE 1 TABLET BY MOUTH EVERY DAY (Patient not taking: Reported on 01/18/2018) 90 tablet 4  . Clobetasol Prop Emollient Base 0.05 % emollient cream     . HYDROcodone-acetaminophen (NORCO) 7.5-325 MG tablet Take 1 tablet by mouth every 6 (six) hours as needed for pain.    Marland Kitchen letrozole (FEMARA) 2.5 MG tablet Take 1 tablet by mouth daily (Patient not taking: Reported on 03/31/2018) 90 tablet 3  . nystatin cream (MYCOSTATIN) Apply 1 application topically 2 (two) times daily. (Patient taking differently: Apply 1 application topically 2 (two) times daily. As needed) 30 g 0  . sucralfate (CARAFATE) 1 g tablet Take 1 tablet (1 g total) by mouth 4 (four) times daily. (Patient not taking: Reported on 01/11/2018) 60 tablet 0    Review of Systems: GENERAL:  Feels "ok"e.  No fevers, sweats or weight loss. PERFORMANCE STATUS (ECOG):  1 HEENT:  No visual changes, runny nose, sore throat, mouth sores or tenderness. Lungs: No shortness of breath or cough.  No hemoptysis. Cardiac:  Chest pain (see HPI). No palpitations, orthopnea, or PND. GI:  Diarrhea (last night).  No  nausea, vomiting, constipation, melena or hematochezia. GU:  No urgency, frequency, dysuria, or hematuria. Musculoskeletal:  No back pain.  No joint pain.  No muscle tenderness. Extremities:  No pain or swelling. Skin:  No rashes or skin changes. Neuro:  No headache, numbness or weakness, balance or coordination issues.  Notes syncopal episode 2-3 weeks ago (- cardiology w/u). Endocrine:  No diabetes, thyroid issues, hot flashes or night sweats. Psych:  No mood changes, depression or anxiety.  Poor sleep. Pain:  No focal pain. Review of systems:  All other systems reviewed and found to be negative.  Physical Exam:  Blood pressure 140/86, pulse 80, temperature 98.4 F (36.9 C), temperature source Oral, resp. rate 18, height '5\' 4"'$  (  1.626 m), weight 143 lb 11.2 oz (65.2 kg), SpO2 100 %.  GENERAL:  Well developed, well nourished, woman sitting comfortably on the medical unit in no acute distress. MENTAL STATUS:  Alert and oriented to person, place and time. HEAD:  Curly dark hair.  Normocephalic, atraumatic, face symmetric, no Cushingoid features. EYES:  Glasses.  Brown eyes.  Pupils equal round and reactive to light and accomodation.  No conjunctivitis or scleral icterus. ENT:  Oropharynx clear without lesion.  Tongue normal. Mucous membranes moist.  RESPIRATORY:  Clear to auscultation without rales, wheezes or rhonchi. CARDIOVASCULAR:  Regular rate and rhythm without murmur, rub or gallop. ABDOMEN:  Soft, non-tender, with active bowel sounds, and no hepatosplenomegaly.  No masses. SKIN:  No rashes, ulcers or lesions. EXTREMITIES: No edema, no skin discoloration or tenderness.  No palpable cords. LYMPH NODES: No palpable cervical, supraclavicular, axillary or inguinal adenopathy  NEUROLOGICAL: Unremarkable. PSYCH:  Appropriate.   Results for orders placed or performed during the hospital encounter of 03/31/18 (from the past 48 hour(s))  Basic metabolic panel     Status: Abnormal    Collection Time: 03/31/18  2:31 PM  Result Value Ref Range   Sodium 141 135 - 145 mmol/L   Potassium 3.8 3.5 - 5.1 mmol/L   Chloride 107 98 - 111 mmol/L   CO2 26 22 - 32 mmol/L   Glucose, Bld 170 (H) 70 - 99 mg/dL   BUN 25 (H) 8 - 23 mg/dL   Creatinine, Ser 1.11 (H) 0.44 - 1.00 mg/dL   Calcium 9.5 8.9 - 10.3 mg/dL   GFR calc non Af Amer 47 (L) >60 mL/min   GFR calc Af Amer 55 (L) >60 mL/min   Anion gap 8 5 - 15    Comment: Performed at [facility], [address] [place], [place] [address]  CBC     Status: Abnormal   Collection Time: 03/31/18  2:31 PM  Result Value Ref Range   WBC 11.4 (H) 4.0 - 10.5 K/uL   RBC 4.65 3.87 - 5.11 MIL/uL   Hemoglobin 14.0 12.0 - 15.0 g/dL   HCT 43.3 36.0 - 46.0 %   MCV 93.1 80.0 - 100.0 fL   MCH 30.1 26.0 - 34.0 pg   MCHC 32.3 30.0 - 36.0 g/dL   RDW 14.6 11.5 - 15.5 %   Platelets 271 150 - 400 K/uL   nRBC 0.0 0.0 - 0.2 %    Comment: Performed at [facility], [address] [place], [place] [address]  Troponin I - ONCE - STAT     Status: Abnormal   Collection Time: 03/31/18  2:31 PM  Result Value Ref Range   Troponin I 0.07 (HH) <0.03 ng/mL    Comment: CRITICAL RESULT CALLED TO, READ BACK BY AND VERIFIED WITH AMBER PAYNE @ 1511 03/31/2018 SMA/PMF Performed at [facility], [address] [place], [place] [address]   Troponin I - Now Then [facility]     Status: Abnormal   Collection Time: 03/31/18  5:41 PM  Result Value Ref Range   Troponin I 0.10 (HH) <0.03 ng/mL    Comment: CRITICAL VALUE NOTED. VALUE IS CONSISTENT WITH PREVIOUSLY REPORTED/CALLED VALUE.PMF Performed at [facility], [address] [place],  [address]   Troponin I - Now Then [facility]     Status: Abnormal   Collection Time: 03/31/18  8:17 PM  Result Value Ref Range   Troponin I 0.12 (HH) <0.03 ng/mL    Comment:  CRITICAL VALUE NOTED. VALUE IS CONSISTENT WITH PREVIOUSLY REPORTED/CALLED VALUE / Manuel Garcia Performed at [facility],  [address] [place], [place] [address]   Troponin I - Now Then [facility]     Status: Abnormal   Collection Time: 03/31/18 11:10 PM  Result Value Ref Range   Troponin I 0.11 (HH) <0.03 ng/mL    Comment: CRITICAL VALUE NOTED. VALUE IS CONSISTENT WITH PREVIOUSLY REPORTED/CALLED VALUE / JAG Performed at [facility], [address] [place], [place] [address]   Hepatic function panel     Status: None   Collection Time: 04/01/18  5:13 AM  Result Value Ref Range   Total Protein 6.5 6.5 - 8.1 g/dL   Albumin 3.5 3.5 - 5.0 g/dL   AST 19 15 - 41 U/L   ALT 12 0 - 44 U/L   Alkaline Phosphatase 55 38 - 126 U/L   Total Bilirubin 0.8 0.3 - 1.2 mg/dL   Bilirubin, Direct <0.1 0.0 - 0.2 mg/dL   Indirect Bilirubin NOT CALCULATED 0.3 - 0.9 mg/dL    Comment: Performed at [facility], [address] [place], [place] [address]  CBC     Status: None   Collection Time: 04/01/18  9:37 AM  Result Value Ref Range   WBC 10.0 4.0 - 10.5 K/uL   RBC 4.70 3.87 - 5.11 MIL/uL   Hemoglobin 13.8 12.0 - 15.0 g/dL   HCT 43.3 36.0 - 46.0 %   MCV 92.1 80.0 - 100.0 fL   MCH 29.4 26.0 - 34.0 pg   MCHC 31.9 30.0 - 36.0 g/dL   RDW 15.0 11.5 - 15.5 %   Platelets 253 150 - 400 K/uL   nRBC 0.0 0.0 - 0.2 %    Comment: Performed at [facility], [address] [place], [place] [address]  Basic metabolic panel     Status: Abnormal   Collection Time: 04/01/18  9:37 AM  Result Value Ref Range   Sodium 138 135 - 145 mmol/L   Potassium 3.6 3.5 - 5.1 mmol/L   Chloride 104 98 - 111 mmol/L   CO2 27 22 - 32 mmol/L   Glucose, Bld 144 (H) 70 - 99 mg/dL   BUN 24 (H) 8 - 23 mg/dL   Creatinine, Ser 0.96 0.44 - 1.00 mg/dL   Calcium 9.0 8.9 - 10.3 mg/dL   GFR calc non Af Amer 56 (L) >60 mL/min   GFR calc Af Amer >60 >60 mL/min   Anion gap 7 5 - 15    Comment: Performed at [facility], [address] [place],  [address]   Dg Chest 2 View  Result Date: 03/31/2018 CLINICAL DATA:   Left-sided chest pain radiating to left shoulder. History of breast cancer. EXAM: CHEST - 2 VIEW COMPARISON:  12/11/2017 FINDINGS: Stable large hiatal hernia. Normal heart size. Right lung is clear. Subsegmental atelectasis at the left base. No pneumothorax or pleural effusion. IMPRESSION: Hiatal hernia and associated subsegmental atelectasis at the left base. Electronically Signed   By: Marybelle Killings M.D.   On: 03/31/2018 15:08   Ct Angio Chest Pe W And/or Wo Contrast  Result Date: 03/31/2018 CLINICAL DATA:  Chest pain this morning EXAM: CT ANGIOGRAPHY CHEST WITH CONTRAST TECHNIQUE: Multidetector CT imaging of the chest was performed using the standard protocol during bolus administration of intravenous contrast. Multiplanar CT image reconstructions and MIPs were obtained to evaluate the vascular anatomy. CONTRAST:  57m OMNIPAQUE IOHEXOL 350 MG/ML SOLN COMPARISON:  None.  FINDINGS: Cardiovascular: There are no filling defects in the pulmonary arterial tree to suggest acute pulmonary thromboembolism. There is no obvious evidence of aortic dissection or aortic aneurysm. Great vessels are patent. Moderate 3 vessel coronary artery calcifications. Mediastinum/Nodes: No abnormal mediastinal adenopathy. No pericardial effusion. Thyroid is unremarkable. A large hiatal hernia contains nearly the entire stomach. Lungs/Pleura: Dependent atelectasis at the left base. No pneumothorax or pleural effusion. No spiculated lung mass or consolidation. Upper Abdomen: 5.2 cm nonspecific mass in the left lobe of the liver on image 78 is stable. This is compared to a prior abdomen CT dated 01/21/2015. On that study, this area fill to enhance. Other benign appearing cysts within the liver are again noted and are not significantly changed. Musculoskeletal: No vertebral compression deformity. There is patchy sclerosis throughout the entire thoracic and visualized lumbar spine. No focal destructive lesion is identified. Review of the MIP  images confirms the above findings. IMPRESSION: No evidence of acute pulmonary thromboembolism. Large hiatal hernia contains most of the stomach. There is associated atelectasis at the left lung base. Stable benign findings in the abdomen. There is patchy sclerosis throughout the visualized thoracic and lumbar spine. This may simply represent a variation of the patient's vertebrae and bone marrow, however given the history of breast cancer, sclerotic metastatic disease throughout the spine is not excluded. Consider bone scan to further delineate. Aortic Atherosclerosis (ICD10-I70.0). Electronically Signed   By: Marybelle Killings M.D.   On: 03/31/2018 16:43    Assessment:  The patient is a 78 y.o. woman with stage IA right breast cancer on Femara.  She has a history of coronary artery disease status post myocardial infarction.  She was admitted with atypical chest pain.  Chest CT angiogram on 03/31/2018 revealed no evidence of pulmonary embolism.  She has a large hiatal hernia and atelectasis of the left lung base.  There was patchy sclerosis throughout the visualized thoracic and lumbar spine possibly representing a variation of the patient's vertebrae and bone marrow.  Sclerotic metastatic disease throughout the spine was not excluded.  Symptomatically, her left-sided chest pain has resolved.  She denies any bone pain.  Alkaline phosphatase is 55.  Plan:   1.  Oncology:   Patient has a history of stage IA right breast cancer on endocrine therapy.  She is felt to have a low likelihood of metastatic disease.    She denies any bone pain.  Alkaline phosphatase is normal.  CA 27-29 is pending.    I discussed with the patient consideration of bone scan in the outpatient department if there are any concerns.   Thank you for allowing me to participate in [facility] 's care.  I will follow her closely with you while hospitalized.  She will see Dr. Grayland Ormond after discharge in the outpatient  department.   Lequita Asal, MD  04/01/2018, 10:50 AM

## 2018-04-01 NOTE — Discharge Instructions (Signed)
It was so nice to meet you during this hospitalization!  We have started a new medication called Metoprolol. Please take 1 tablet twice a day.  Please make sure you follow-up with your primary care doctor and your cardiologist.  Take care, Dr. Brett Albino

## 2018-04-01 NOTE — Progress Notes (Signed)
Informed Dr. Brett Albino of patient's first and second sinus pauses on the monitor shortly after I was notified by CCMD throughout this afternoon via text pages. I reviewed the strips as well. Awaiting new order(s) / possible discharge order at this time. Renewed cardiac monitoring order until potential discharge. Will continue to monitor. Wenda Low [facility]

## 2018-04-01 NOTE — Plan of Care (Signed)
  Problem: Clinical Measurements: Goal: Ability to maintain clinical measurements within normal limits will improve Outcome: Not Progressing Note:  Patient's B.U.N. is elevated today at 24. Will continue to monitor renal function. Wenda Low [facility]

## 2018-04-01 NOTE — Discharge Summary (Signed)
[facility] NAME: [name] [name]    MR#:  [id number]  DATE OF BIRTH:  November 16, 1940  DATE OF ADMISSION:  03/31/2018   ADMITTING PHYSICIAN: Gorden Harms, MD  DATE OF DISCHARGE: 04/02/18  PRIMARY CARE PHYSICIAN: Jerrol Banana., MD   ADMISSION DIAGNOSIS:  Chest pain, unspecified type [R07.9] DISCHARGE DIAGNOSIS:  Active Problems:   Chest pain  SECONDARY DIAGNOSIS:   Past Medical History:  Diagnosis Date  . 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 Mucin noted on biopsy, not on wide excision.  . Coronary artery disease   . GERD (gastroesophageal reflux disease)   . Hyperlipidemia   . Hypertension   . Lichen planus [id number]   Right breast in field of whole breast radiation. Dr. Kellie Moor DX by punch bioppsy  . Lichen planus    right breast   . MI (myocardial infarction) ([place])    2015  . Personal history of radiation therapy 09/2016   RIGHT lumpectomy w/ radiation   HOSPITAL COURSE:   [name] [name] is a 78 year old female presented to the ED with left-sided chest pain and associated shortness of breath.  She was admitted for further management.  Troponins were mildly elevated and flat.  Cardiology was consulted and recommended continuing medical management with consideration of outpatient stress test versus cath.  She was started on metoprolol this admission.  She was discharged home with close PCP and cardiology follow-up.  She had a CTA chest on admission to rule out PE, which showed sclerosis of the lumbar spine due to variation of patient's vertebra versus sclerotic metastatic disease (given her history of breast cancer).  Oncology was consulted and felt that she had a low likelihood of metastatic disease, due to a lack of bone pain and a normal alkaline phosphatase. She should be considered for bone scan in the outpatient setting. Patient stated that she was no longer  taking letrozole.  This should be followed up in oncology clinic, as the most recent oncology note states that she should be taking letrozole for 5 years. She will follow-up with her oncologist on discharge.  DISCHARGE CONDITIONS:  CAD Sclerosis of lumbar spine Hypertension Chronic GERD Hyperlipidemia History of breast cancer on chemotherapy CONSULTS OBTAINED:  Treatment Team:  Yolonda Kida, MD DRUG ALLERGIES:   Allergies  Allergen Reactions  . Celebrex  [Celecoxib]     Dark stools.  . Penicillins Other (See Comments)    Childhood reaction Has patient had a PCN reaction causing immediate rash, facial/tongue/throat swelling, SOB or lightheadedness with hypotension: Unknown Has patient had a PCN reaction causing severe rash involving mucus membranes or skin necrosis: Unknown Has patient had a PCN reaction that required hospitalization: Unknown Has patient had a PCN reaction occurring within the last 10 years: Unknown If all of the above answers are "NO", then may proceed with Cephalosporin use.   Marland Kitchen Prevacid [Lansoprazole] Diarrhea   DISCHARGE MEDICATIONS:   Allergies as of 04/01/2018      Reactions   Celebrex  [celecoxib]    Dark stools.   Penicillins Other (See Comments)   Childhood reaction Has patient had a PCN reaction causing immediate rash, facial/tongue/throat swelling, SOB or lightheadedness with hypotension: Unknown Has patient had a PCN reaction causing severe rash involving mucus membranes or skin necrosis: Unknown Has patient had a PCN reaction that required hospitalization: Unknown Has patient had a PCN reaction occurring within the last  10 years: Unknown If all of the above answers are "NO", then may proceed with Cephalosporin use.   Prevacid [lansoprazole] Diarrhea      Medication List    STOP taking these medications   amLODipine 5 MG tablet Commonly known as:  NORVASC   letrozole 2.5 MG tablet Commonly known as:  FEMARA   sucralfate 1 g  tablet Commonly known as:  CARAFATE     TAKE these medications   aspirin EC 81 MG tablet Take 81 mg by mouth daily.   Clobetasol Prop Emollient Base 0.05 % emollient cream   HYDROcodone-acetaminophen 7.5-325 MG tablet Commonly known as:  NORCO Take 1 tablet by mouth every 6 (six) hours as needed for pain.   lisinopril 20 MG tablet Commonly known as:  PRINIVIL,ZESTRIL TAKE 1 TABLET BY MOUTH EVERY DAY   Metoprolol Tartrate 37.5 MG Tabs Take 37.5 mg by mouth 2 (two) times daily.   nystatin cream Commonly known as:  MYCOSTATIN Apply 1 application topically 2 (two) times daily. What changed:  additional instructions   omeprazole 20 MG capsule Commonly known as:  PRILOSEC Take 20 mg by mouth daily as needed (indigestion).   pravastatin 10 MG tablet Commonly known as:  PRAVACHOL Take 10 mg by mouth daily.   triamcinolone cream 0.1 % Commonly known as:  KENALOG APPLY TWICE DAILY TO RASH ON RIGHT BREAST UNTIL CLEAR        DISCHARGE INSTRUCTIONS:  1.  Follow-up with PCP in 5 days 2.  Follow-up with oncology in 1 to 2 weeks 3.  Follow-up with cardiology in 1 to 2 weeks 4.  Patient states she is no longer taking letrozole.  Please discuss this as an outpatient, as most recent oncology note states she should be taking letrozole for 5 years. DIET:  Cardiac diet DISCHARGE CONDITION:  Stable ACTIVITY:  Activity as tolerated OXYGEN:  Home Oxygen: No.  Oxygen Delivery: room air DISCHARGE LOCATION:  home   If you experience worsening of your admission symptoms, develop shortness of breath, life threatening emergency, suicidal or homicidal thoughts you must seek medical attention immediately by calling 911 or calling your MD immediately  if symptoms less severe.  You Must read complete instructions/literature along with all the possible adverse reactions/side effects for all the Medicines you take and that have been prescribed to you. Take any new Medicines after you have  completely understood and accpet all the possible adverse reactions/side effects.   Please note  You were cared for by a hospitalist during your hospital stay. If you have any questions about your discharge medications or the care you received while you were in the hospital after you are discharged, you can call the unit and asked to speak with the hospitalist on call if the hospitalist that took care of you is not available. Once you are discharged, your primary care physician will handle any further medical issues. Please note that NO REFILLS for any discharge medications will be authorized once you are discharged, as it is imperative that you return to your primary care physician (or establish a relationship with a primary care physician if you do not have one) for your aftercare needs so that they can reassess your need for medications and monitor your lab values.    On the day of Discharge:  VITAL SIGNS:  Blood pressure 131/68, pulse 71, temperature 98.4 F (36.9 C), temperature source Oral, resp. rate 18, height 5\' 4"  (1.626 m), weight 65.2 kg, SpO2 99 %. PHYSICAL EXAMINATION:  GENERAL:  78 y.o.-year-old patient lying in the bed with no acute distress.  EYES: Pupils equal, round, reactive to light and accommodation. No scleral icterus. Extraocular muscles intact.  HEENT: Head atraumatic, normocephalic. Oropharynx and nasopharynx clear.  NECK:  Supple, no jugular venous distention. No thyroid enlargement, no tenderness.  LUNGS: Normal breath sounds bilaterally, no wheezing, rales,rhonchi or crepitation. No use of accessory muscles of respiration.  CARDIOVASCULAR: S1, S2 normal. No murmurs, rubs, or gallops.  ABDOMEN: Soft, non-tender, non-distended. Bowel sounds present. No organomegaly or mass.  EXTREMITIES: No pedal edema, cyanosis, or clubbing.  NEUROLOGIC: Cranial nerves II through XII are intact. Muscle strength 5/5 in all extremities. Sensation intact. Gait not checked.  PSYCHIATRIC:  The patient is alert and oriented x 3.  SKIN: No obvious rash, lesion, or ulcer.  DATA REVIEW:   CBC Recent Labs  Lab 04/01/18 0937  WBC 10.0  HGB 13.8  HCT 43.3  PLT 253    Chemistries  Recent Labs  Lab 04/01/18 0513 04/01/18 0937  NA  --  138  K  --  3.6  CL  --  104  CO2  --  27  GLUCOSE  --  144*  BUN  --  24*  CREATININE  --  0.96  CALCIUM  --  9.0  AST 19  --   ALT 12  --   ALKPHOS 55  --   BILITOT 0.8  --      Microbiology Results  Results for orders placed or performed in visit on 06/30/16  Urine culture     Status: None   Collection Time: 06/30/16 12:00 AM  Result Value Ref Range Status   Urine Culture, Routine Final report  Final   Organism ID, Bacteria No growth  Final    RADIOLOGY:  No results found.   Management plans discussed with the patient, family and they are in agreement.  CODE STATUS: Partial Code   TOTAL TIME TAKING CARE OF THIS PATIENT: 35 minutes.    Berna Spare  M.D on 04/01/2018 at 6:16 PM  Between 7am to 6pm - Pager [phone]  After 6pm go to [email or web address] - password [email or web address] [facility]  [facility]  Office  [phone]  CC: Primary care physician; Jerrol Banana., MD   Note: This dictation was prepared with Dragon dictation along with smaller phrase technology. Any transcriptional errors that result from this process are unintentional.

## 2018-04-03 ENCOUNTER — Telehealth: Payer: Self-pay

## 2018-04-03 NOTE — Telephone Encounter (Signed)
Transition Care Management Follow-up Telephone Call  Date of discharge and from where: [facility] on 04/01/18.  How have you been since you were released from the hospital? Doing better, no more chest pain. Declines any other s/s.   Any questions or concerns? No   Items Reviewed:  Did the pt receive and understand the discharge instructions provided? Yes   Medications obtained and verified? Yes   Any new allergies since your discharge? No   Dietary orders reviewed? Yes  Do you have support at home? Yes   Other (ie: DME, Home Health, etc) N/A  Functional Questionnaire: (I = Independent and D = Dependent)  Bathing/Dressing- I   Meal Prep- I  Eating- I  Maintaining continence- I  Transferring/Ambulation- I  Managing Meds- I   Follow up appointments reviewed:    PCP Hospital f/u appt confirmed? Yes  Scheduled to see Dr Rosanna Randy on 04/06/18 @ 10:40 AM.  [facility] f/u appt confirmed? Yes   Are transportation arrangements needed? No   If their condition worsens, is the pt aware to call  their PCP or go to the ED? Yes  Was the patient provided with contact information for the PCP's office or ED? Yes  Was the pt encouraged to call back with questions or concerns? Yes

## 2018-04-04 LAB — CANCER ANTIGEN 27.29: CA 27.29: 24.8 U/mL (ref 0.0–38.6)

## 2018-04-06 ENCOUNTER — Encounter: Payer: Self-pay | Admitting: Family Medicine

## 2018-04-06 ENCOUNTER — Other Ambulatory Visit: Payer: Self-pay

## 2018-04-06 ENCOUNTER — Ambulatory Visit (INDEPENDENT_AMBULATORY_CARE_PROVIDER_SITE_OTHER): Payer: PPO | Admitting: Family Medicine

## 2018-04-06 VITALS — BP 136/78 | HR 80 | Temp 98.2°F | Ht 64.0 in | Wt 144.0 lb

## 2018-04-06 DIAGNOSIS — I2510 Atherosclerotic heart disease of native coronary artery without angina pectoris: Secondary | ICD-10-CM

## 2018-04-06 DIAGNOSIS — E119 Type 2 diabetes mellitus without complications: Secondary | ICD-10-CM | POA: Diagnosis not present

## 2018-04-06 DIAGNOSIS — Z170 Estrogen receptor positive status [ER+]: Secondary | ICD-10-CM | POA: Diagnosis not present

## 2018-04-06 DIAGNOSIS — C50211 Malignant neoplasm of upper-inner quadrant of right female breast: Secondary | ICD-10-CM

## 2018-04-06 DIAGNOSIS — R0789 Other chest pain: Secondary | ICD-10-CM | POA: Diagnosis not present

## 2018-04-06 DIAGNOSIS — K257 Chronic gastric ulcer without hemorrhage or perforation: Secondary | ICD-10-CM

## 2018-04-06 MED ORDER — OMEPRAZOLE 20 MG PO CPDR: 20.0000 mg | DELAYED_RELEASE_CAPSULE | Freq: Every day | ORAL | 5 refills | Status: DC | PRN

## 2018-04-06 NOTE — Progress Notes (Signed)
Patient: [name] [name] Female    DOB: 1940-03-24   78 y.o.   MRN: [id number] Visit Date: 04/06/2018  Today's Provider: Wilhemena Durie, MD   Chief Complaint  Patient presents with  . Hospitalization Follow-up    chest pain   Subjective:     HPI   Follow up Hospitalization This is a transitional care visit. Patient was admitted to [facility] on 03/31/18 and discharged on 04/01/18. She was treated for chest pain. Treatment for this included starting metoprolol.  Discontinued amlodipine, letrozole, sulcrafate . Telephone follow up was done on  She reports good compliance with treatment pt reports she hasn't started on the metoprolol yet due to the pharmacy not having in stock.. She reports this condition is Improved.  ------------------------------------------------------------------------------------     Allergies  Allergen Reactions  . Celebrex  [Celecoxib]     Dark stools.  . Penicillins Other (See Comments)    Childhood reaction Has patient had a PCN reaction causing immediate rash, facial/tongue/throat swelling, SOB or lightheadedness with hypotension: Unknown Has patient had a PCN reaction causing severe rash involving mucus membranes or skin necrosis: Unknown Has patient had a PCN reaction that required hospitalization: Unknown Has patient had a PCN reaction occurring within the last 10 years: Unknown If all of the above answers are "NO", then may proceed with Cephalosporin use.   Marland Kitchen Prevacid [Lansoprazole] Diarrhea     Current Outpatient Medications:  .  aspirin EC 81 MG tablet, Take 81 mg by mouth daily., Disp: , Rfl:  .  lisinopril (PRINIVIL,ZESTRIL) 20 MG tablet, TAKE 1 TABLET BY MOUTH EVERY DAY, Disp: , Rfl:  .  triamcinolone cream (KENALOG) 0.1 %, APPLY TWICE DAILY TO RASH ON RIGHT BREAST UNTIL CLEAR, Disp: , Rfl: 0 .  Clobetasol Prop Emollient Base 0.05 % emollient cream, as needed. , Disp: , Rfl:  .  HYDROcodone-acetaminophen (NORCO) 7.5-325 MG  tablet, Take 1 tablet by mouth every 6 (six) hours as needed for pain., Disp: , Rfl:  .  metoprolol tartrate 37.5 MG TABS, Take 37.5 mg by mouth 2 (two) times daily. (Patient not taking: Reported on 04/03/2018), Disp: 60 tablet, Rfl: 0 .  nystatin cream (MYCOSTATIN), Apply 1 application topically 2 (two) times daily. (Patient not taking: Reported on 04/06/2018), Disp: 30 g, Rfl: 0 .  omeprazole (PRILOSEC) 20 MG capsule, Take 20 mg by mouth daily as needed (indigestion)., Disp: , Rfl:  .  pravastatin (PRAVACHOL) 10 MG tablet, Take 10 mg by mouth daily., Disp: , Rfl:   Review of Systems  Constitutional: Negative.   HENT: Negative.   Eyes: Negative.   Respiratory: Negative.   Cardiovascular: Negative.   Gastrointestinal: Negative.   Endocrine: Negative.   Genitourinary: Negative.   Musculoskeletal: Negative.   Skin: Negative.   Allergic/Immunologic: Negative.   Neurological: Negative.   Hematological: Negative.   Psychiatric/Behavioral: Negative.     Social History   Tobacco Use  . Smoking status: Former Smoker    Packs/day: 0.25    Years: 10.00    Pack years: 2.50    Types: Cigarettes    Last attempt to quit: 03/06/1998    Years since quitting: 20.0  . Smokeless tobacco: Never Used  Substance Use Topics  . Alcohol use: No      Objective:   BP 136/78 (BP Location: Right Arm, Patient Position: Sitting, Cuff Size: Normal)   Pulse 80   Temp 98.2 F (36.8 C) (Oral)   Ht 5\' 4"  (1.626  m)   Wt 144 lb (65.3 kg)   SpO2 99%   BMI 24.72 kg/m  Vitals:   04/06/18 1105  BP: 136/78  Pulse: 80  Temp: 98.2 F (36.8 C)  TempSrc: Oral  SpO2: 99%  Weight: 144 lb (65.3 kg)  Height: 5\' 4"  (1.626 m)     Physical Exam Constitutional:      Appearance: She is well-developed.  HENT:     Head: Normocephalic and atraumatic.     Right Ear: External ear normal.     Left Ear: External ear normal.     Nose: Nose normal.  Eyes:     General: No scleral icterus.    Conjunctiva/sclera:  Conjunctivae normal.  Neck:     Thyroid: No thyromegaly.  Cardiovascular:     Rate and Rhythm: Normal rate and regular rhythm.     Heart sounds: Normal heart sounds.  Pulmonary:     Effort: Pulmonary effort is normal.     Breath sounds: Normal breath sounds.  Abdominal:     Palpations: Abdomen is soft.  Skin:    General: Skin is warm and dry.  Neurological:     Mental Status: She is alert and oriented to person, place, and time.  Psychiatric:        Behavior: Behavior normal.        Thought Content: Thought content normal.        Judgment: Judgment normal.         Assessment & Plan    1. Other chest pain Probable noncardiac chest pain.  2. Atherosclerosis of coronary artery of native heart without angina pectoris, unspecified vessel or lesion type Follow-up with cardiology.  3. Gastric ulcer requiring drug therapy, chronic Continue omeprazole indefinitely.  4. Type 2 diabetes mellitus without complication, unspecified whether long term insulin use ([place])   5. Malignant neoplasm of upper-inner quadrant of right breast in female, estrogen receptor positive [facility]) Per oncology.     I have done the exam and reviewed the above chart and it is accurate to the best of my knowledge. Development worker, community has been used in this note in any air is in the dictation or transcription are unintentional.  Wilhemena Durie, MD  [name]

## 2018-04-10 DIAGNOSIS — R7989 Other specified abnormal findings of blood chemistry: Secondary | ICD-10-CM | POA: Diagnosis not present

## 2018-04-10 DIAGNOSIS — R0789 Other chest pain: Secondary | ICD-10-CM | POA: Diagnosis not present

## 2018-04-10 DIAGNOSIS — I10 Essential (primary) hypertension: Secondary | ICD-10-CM | POA: Diagnosis not present

## 2018-04-10 DIAGNOSIS — I25118 Atherosclerotic heart disease of native coronary artery with other forms of angina pectoris: Secondary | ICD-10-CM | POA: Diagnosis not present

## 2018-04-10 DIAGNOSIS — I119 Hypertensive heart disease without heart failure: Secondary | ICD-10-CM | POA: Insufficient documentation

## 2018-04-25 ENCOUNTER — Ambulatory Visit (INDEPENDENT_AMBULATORY_CARE_PROVIDER_SITE_OTHER): Payer: PPO | Admitting: Family Medicine

## 2018-04-25 ENCOUNTER — Encounter: Payer: Self-pay | Admitting: Family Medicine

## 2018-04-25 VITALS — BP 128/72 | HR 80 | Temp 98.2°F | Resp 16 | Ht 64.0 in | Wt 142.0 lb

## 2018-04-25 DIAGNOSIS — E119 Type 2 diabetes mellitus without complications: Secondary | ICD-10-CM

## 2018-04-25 DIAGNOSIS — K219 Gastro-esophageal reflux disease without esophagitis: Secondary | ICD-10-CM

## 2018-04-25 DIAGNOSIS — M791 Myalgia, unspecified site: Secondary | ICD-10-CM | POA: Diagnosis not present

## 2018-04-25 DIAGNOSIS — R739 Hyperglycemia, unspecified: Secondary | ICD-10-CM

## 2018-04-25 DIAGNOSIS — I10 Essential (primary) hypertension: Secondary | ICD-10-CM

## 2018-04-25 DIAGNOSIS — E7800 Pure hypercholesterolemia, unspecified: Secondary | ICD-10-CM

## 2018-04-25 NOTE — Progress Notes (Signed)
Patient: [name] [name] Female    DOB: [birth year]   78 y.o.   MRN: [id number] Visit Date: 04/25/2018  Today's Provider: Wilhemena Durie, MD   Chief Complaint  Patient presents with  . Hypertension  . Dizziness  . Gastroesophageal Reflux   Subjective:     HPI   Hypertension, follow-up: She was last seen for hypertension 3 weeks ago.  BP at that visit was 136/78. Management since that visit includes no changes. We were to continue to monitor her BP. Patient was d/c from the hospital about a month ago.  She reports good compliance with treatment. She is not having side effects. However, patient reports that she still has occasional dizzy spells. This is much better than before.  BP Readings from Last 3 Encounters:  04/25/18 128/72  04/06/18 136/78  04/01/18 131/68   Outside blood pressures are checked occasionally. Patient denies exertional chest pressure/discomfort, lower extremity edema and palpitations.   Cardiovascular risk factors include dyslipidemia.  Weight trend: stable Wt Readings from Last 3 Encounters:  04/25/18 142 lb (64.4 kg)  04/06/18 144 lb (65.3 kg)  04/01/18 143 lb 11.2 oz (65.2 kg)   GERD, follow up: Patient reports that she has not started omeprazole yet. She has not had any GERD symptoms or chest pain since her last visit. Patient will keep medication on hand just in case she develops symptoms again.  Allergies  Allergen Reactions  . Celebrex  [Celecoxib]     Dark stools.  . Penicillins Other (See Comments)    Childhood reaction Has patient had a PCN reaction causing immediate rash, facial/tongue/throat swelling, SOB or lightheadedness with hypotension: Unknown Has patient had a PCN reaction causing severe rash involving mucus membranes or skin necrosis: Unknown Has patient had a PCN reaction that required hospitalization: Unknown Has patient had a PCN reaction occurring within the last 10 years: Unknown If all of the above answers  are "NO", then may proceed with Cephalosporin use.   Marland Kitchen Prevacid [Lansoprazole] Diarrhea     Current Outpatient Medications:  .  aspirin EC 81 MG tablet, Take 81 mg by mouth daily., Disp: , Rfl:  .  Clobetasol Prop Emollient Base 0.05 % emollient cream, as needed. , Disp: , Rfl:  .  HYDROcodone-acetaminophen (NORCO) 7.5-325 MG tablet, Take 1 tablet by mouth every 6 (six) hours as needed for pain., Disp: , Rfl:  .  lisinopril (PRINIVIL,ZESTRIL) 20 MG tablet, TAKE 1 TABLET BY MOUTH EVERY DAY, Disp: , Rfl:  .  pravastatin (PRAVACHOL) 10 MG tablet, Take 10 mg by mouth daily., Disp: , Rfl:  .  triamcinolone cream (KENALOG) 0.1 %, APPLY TWICE DAILY TO RASH ON RIGHT BREAST UNTIL CLEAR, Disp: , Rfl: 0 .  metoprolol tartrate 37.5 MG TABS, Take 37.5 mg by mouth 2 (two) times daily. (Patient not taking: Reported on 04/03/2018), Disp: 60 tablet, Rfl: 0 .  nystatin cream (MYCOSTATIN), Apply 1 application topically 2 (two) times daily. (Patient not taking: Reported on 04/06/2018), Disp: 30 g, Rfl: 0 .  omeprazole (PRILOSEC) 20 MG capsule, Take 1 capsule (20 mg total) by mouth daily as needed (indigestion). (Patient not taking: Reported on 04/25/2018), Disp: 30 capsule, Rfl: 5  Review of Systems  Constitutional: Negative for activity change, appetite change, chills, diaphoresis, fatigue, fever and unexpected weight change.  HENT: Negative.   Eyes: Negative.   Respiratory: Negative for cough and shortness of breath.   Cardiovascular: Negative for chest pain, palpitations and leg swelling.  Gastrointestinal: Negative for abdominal distention, abdominal pain, anal bleeding, blood in stool, constipation, diarrhea, nausea, rectal pain and vomiting.       Heartburn positive/GERD symptoms  Endocrine: Negative.   Neurological: Positive for dizziness. Negative for weakness, light-headedness and headaches.    Social History   Tobacco Use  . Smoking status: Former Smoker    Packs/day: 0.25    Years: 10.00     Pack years: 2.50    Types: Cigarettes    Last attempt to quit: 03/06/1998    Years since quitting: 20.1  . Smokeless tobacco: Never Used  Substance Use Topics  . Alcohol use: No      Objective:   BP 128/72 (BP Location: Right Arm, Patient Position: Sitting, Cuff Size: Normal)   Pulse 80   Temp 98.2 F (36.8 C)   Resp 16   Ht 5\' 4"  (1.626 m)   Wt 142 lb (64.4 kg)   BMI 24.37 kg/m  Vitals:   04/25/18 1053  BP: 128/72  Pulse: 80  Resp: 16  Temp: 98.2 F (36.8 C)  Weight: 142 lb (64.4 kg)  Height: 5\' 4"  (1.626 m)     Physical Exam Constitutional:      Appearance: She is well-developed.  HENT:     Head: Normocephalic and atraumatic.     Right Ear: External ear normal.     Left Ear: External ear normal.     Nose: Nose normal.  Eyes:     General: No scleral icterus.    Conjunctiva/sclera: Conjunctivae normal.  Neck:     Thyroid: No thyromegaly.  Cardiovascular:     Rate and Rhythm: Normal rate and regular rhythm.     Heart sounds: Normal heart sounds.  Pulmonary:     Effort: Pulmonary effort is normal.     Breath sounds: Normal breath sounds.  Abdominal:     Palpations: Abdomen is soft.  Musculoskeletal:     Right lower leg: No edema.     Left lower leg: No edema.  Skin:    General: Skin is warm and dry.  Neurological:     General: No focal deficit present.     Mental Status: She is alert and oriented to person, place, and time. Mental status is at baseline.  Psychiatric:        Mood and Affect: Mood normal.        Behavior: Behavior normal.        Thought Content: Thought content normal.        Judgment: Judgment normal.         Assessment & Plan    1. Hypercholesteremia On pravastatin. - Lipid panel - TSH - CBC with Differential/Platelet  2. Type 2 diabetes mellitus without complication, unspecified whether long term insulin use ([facility]) Check A1c  3. Hyperglycemia  - Hemoglobin A1c  4. Essential (primary) hypertension Controlled. -  Comprehensive metabolic panel  5. Myalgia  - CK  6. Gastroesophageal reflux disease, esophagitis presence not specified Omeprazole 20 mg every morning.       I have done the exam and reviewed the above chart and it is accurate to the best of my knowledge. Development worker, community has been used in this note in any air is in the dictation or transcription are unintentional.  Wilhemena Durie, MD  [place]

## 2018-04-26 LAB — HEMOGLOBIN A1C
Est. average glucose Bld gHb Est-mCnc: 123 mg/dL
Hgb A1c MFr Bld: 5.9 % — ABNORMAL HIGH (ref 4.8–5.6)

## 2018-04-26 LAB — COMPREHENSIVE METABOLIC PANEL
ALT: 10 IU/L (ref 0–32)
AST: 14 IU/L (ref 0–40)
Albumin/Globulin Ratio: 1.9 (ref 1.2–2.2)
Albumin: 4.1 g/dL (ref 3.7–4.7)
Alkaline Phosphatase: 69 IU/L (ref 39–117)
BUN/Creatinine Ratio: 18 (ref 12–28)
BUN: 18 mg/dL (ref 8–27)
Bilirubin Total: 0.8 mg/dL (ref 0.0–1.2)
CO2: 25 mmol/L (ref 20–29)
CREATININE: 0.98 mg/dL (ref 0.57–1.00)
Calcium: 9.7 mg/dL (ref 8.7–10.3)
Chloride: 105 mmol/L (ref 96–106)
GFR calc Af Amer: 63 mL/min/{1.73_m2} (ref >59)
GFR calc non Af Amer: 55 mL/min/{1.73_m2} — ABNORMAL LOW (ref >59)
Globulin, Total: 2.2 g/dL (ref 1.5–4.5)
Glucose: 87 mg/dL (ref 65–99)
Potassium: 4.8 mmol/L (ref 3.5–5.2)
SODIUM: 144 mmol/L (ref 134–144)
Total Protein: 6.3 g/dL (ref 6.0–8.5)

## 2018-04-26 LAB — CBC WITH DIFFERENTIAL/PLATELET
BASOS ABS: 0.1 10*3/uL (ref 0.0–0.2)
Basos: 1 %
EOS (ABSOLUTE): 0.1 10*3/uL (ref 0.0–0.4)
Eos: 2 %
Hematocrit: 41.2 % (ref 34.0–46.6)
Hemoglobin: 13.5 g/dL (ref 11.1–15.9)
Immature Grans (Abs): 0 10*3/uL (ref 0.0–0.1)
Immature Granulocytes: 1 %
Lymphocytes Absolute: 1.2 10*3/uL (ref 0.7–3.1)
Lymphs: 19 %
MCH: 30.1 pg (ref 26.6–33.0)
MCHC: 32.8 g/dL (ref 31.5–35.7)
MCV: 92 fL (ref 79–97)
Monocytes Absolute: 0.4 10*3/uL (ref 0.1–0.9)
Monocytes: 7 %
Neutrophils Absolute: 4.6 10*3/uL (ref 1.4–7.0)
Neutrophils: 70 %
PLATELETS: 213 10*3/uL (ref 150–450)
RBC: 4.48 x10E6/uL (ref 3.77–5.28)
RDW: 14.2 % (ref 11.7–15.4)
WBC: 6.5 10*3/uL (ref 3.4–10.8)

## 2018-04-26 LAB — LIPID PANEL
CHOLESTEROL TOTAL: 157 mg/dL (ref 100–199)
Chol/HDL Ratio: 3.1 ratio (ref 0.0–4.4)
HDL: 51 mg/dL (ref >39)
LDL Calculated: 84 mg/dL (ref 0–99)
Triglycerides: 111 mg/dL (ref 0–149)
VLDL Cholesterol Cal: 22 mg/dL (ref 5–40)

## 2018-04-26 LAB — CK: Total CK: 82 U/L (ref 24–173)

## 2018-04-26 LAB — TSH: TSH: 0.786 u[IU]/mL (ref 0.450–4.500)

## 2018-05-03 ENCOUNTER — Ambulatory Visit
Admission: RE | Admit: 2018-05-03 | Discharge: 2018-05-03 | Disposition: A | Discharge status: Home / Self Care | Payer: PPO | Source: Ambulatory Visit | Attending: Oncology | Admitting: Oncology

## 2018-05-03 DIAGNOSIS — R918 Other nonspecific abnormal finding of lung field: Secondary | ICD-10-CM | POA: Diagnosis not present

## 2018-05-03 DIAGNOSIS — C50211 Malignant neoplasm of upper-inner quadrant of right female breast: Secondary | ICD-10-CM

## 2018-05-03 DIAGNOSIS — Z170 Estrogen receptor positive status [ER+]: Principal | ICD-10-CM

## 2018-05-03 DIAGNOSIS — Z853 Personal history of malignant neoplasm of breast: Secondary | ICD-10-CM | POA: Diagnosis not present

## 2018-05-03 HISTORY — DX: Malignant neoplasm of unspecified site of unspecified female breast: C50.919

## 2018-05-09 ENCOUNTER — Ambulatory Visit (INDEPENDENT_AMBULATORY_CARE_PROVIDER_SITE_OTHER): Payer: PPO | Admitting: General Surgery

## 2018-05-09 ENCOUNTER — Encounter: Payer: Self-pay | Admitting: General Surgery

## 2018-05-09 ENCOUNTER — Other Ambulatory Visit: Payer: Self-pay

## 2018-05-09 VITALS — BP 152/76 | HR 90 | Temp 97.3°F | Resp 14 | Ht 64.0 in | Wt 142.0 lb

## 2018-05-09 DIAGNOSIS — C50211 Malignant neoplasm of upper-inner quadrant of right female breast: Secondary | ICD-10-CM

## 2018-05-09 DIAGNOSIS — Z170 Estrogen receptor positive status [ER+]: Secondary | ICD-10-CM

## 2018-05-09 NOTE — Patient Instructions (Signed)
The patient has been asked to return to the office in one year with a bilateral diagnostic mammogram.The patient is aware to call back for any questions or concerns. 

## 2018-05-09 NOTE — Progress Notes (Signed)
Patient ID: [name] [name], female   DOB: Oct 13, 1940, 78 y.o.   MRN: [id number]  Chief Complaint  Patient presents with  . Follow-up    HPI [name] [name] is a 78 y.o. female who presents for a breast evaluation. The most recent mammogram was done on 05/03/2018. Skin changes on her breast and back. Patient does perform regular self breast checks and gets regular mammograms done.    HPI  Past Medical History:  Diagnosis Date  . Breast cancer ([place]) 2018   Right breast  . 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 Mucin noted on biopsy, not on wide excision.  . Coronary artery disease   . GERD (gastroesophageal reflux disease)   . Hyperlipidemia   . Hypertension   . Lichen planus 26/94/8546   Right breast in field of whole breast radiation. Dr. Kellie Moor DX by punch bioppsy  . Lichen planus    right breast   . MI (myocardial infarction) ([place])    2015  . Personal history of radiation therapy 09/2016   RIGHT lumpectomy w/ radiation    Past Surgical History:  Procedure Laterality Date  . ABDOMINAL HYSTERECTOMY    . APPENDECTOMY    . BREAST BIOPSY Right 07/23/2016   INVASIVE MAMMARY CARCINOMA WITH AREAS OF EXTRACELLULAR MUCIN  . BREAST EXCISIONAL BIOPSY     INVASIVE MUCINOUS MAMMARY CARCINOMA.   Marland Kitchen BREAST LUMPECTOMY Right 08/20/2016   INVASIVE MUCINOUS MAMMARY CARCINOMA. /Grade 2   . COLONOSCOPY  2016  . CORONARY ANGIOPLASTY WITH STENT PLACEMENT    . CYSTOSCOPY    . OOPHORECTOMY    . PARTIAL MASTECTOMY WITH AXILLARY SENTINEL LYMPH NODE BIOPSY Right 08/09/2016   Procedure: PARTIAL MASTECTOMY WITH AXILLARY SENTINEL LYMPH NODE BIOPSY;  Surgeon: Robert Bellow, MD;  Location: [facility] ORS;  Service: General;  Laterality: Right;  . TONSILLECTOMY      Family History  Problem Relation Age of Onset  . Hyperlipidemia Mother   . Allergies Mother   . Cerebral aneurysm Mother        cause of death at age 38  . Heart disease Father         Fatal MI ag 64  . Cancer Brother        lung cancer  . Hyperlipidemia Brother   . Colonic polyp Brother   . Healthy Son   . Cancer - Lung Brother        colon  . Healthy Son   . Breast cancer Neg Hx     Social History Social History   Tobacco Use  . Smoking status: Former Smoker    Packs/day: 0.25    Years: 10.00    Pack years: 2.50    Types: Cigarettes    Last attempt to quit: 03/06/1998    Years since quitting: 20.1  . Smokeless tobacco: Never Used  Substance Use Topics  . Alcohol use: No  . Drug use: No    Allergies  Allergen Reactions  . Celebrex  [Celecoxib]     Dark stools.  . Penicillins Other (See Comments)    Childhood reaction Has patient had a PCN reaction causing immediate rash, facial/tongue/throat swelling, SOB or lightheadedness with hypotension: Unknown Has patient had a PCN reaction causing severe rash involving mucus membranes or skin necrosis: Unknown Has patient had a PCN reaction that required hospitalization: Unknown Has patient had a PCN reaction occurring within the last 10 years: Unknown If all of the  above answers are "NO", then may proceed with Cephalosporin use.   Marland Kitchen Prevacid [Lansoprazole] Diarrhea    Current Outpatient Medications  Medication Sig Dispense Refill  . aspirin EC 81 MG tablet Take 81 mg by mouth daily.    . Clobetasol Prop Emollient Base 0.05 % emollient cream as needed.     Marland Kitchen lisinopril (PRINIVIL,ZESTRIL) 20 MG tablet TAKE 1 TABLET BY MOUTH EVERY DAY    . metoprolol tartrate 37.5 MG TABS Take 37.5 mg by mouth 2 (two) times daily. 60 tablet 0  . nystatin cream (MYCOSTATIN) Apply 1 application topically 2 (two) times daily. 30 g 0  . omeprazole (PRILOSEC) 20 MG capsule Take 1 capsule (20 mg total) by mouth daily as needed (indigestion). 30 capsule 5  . pravastatin (PRAVACHOL) 10 MG tablet Take 10 mg by mouth daily.    Marland Kitchen triamcinolone cream (KENALOG) 0.1 % APPLY TWICE DAILY TO RASH ON RIGHT BREAST UNTIL CLEAR  0    No current facility-administered medications for this visit.     Review of Systems Review of Systems  Constitutional: Negative.   Respiratory: Negative.   Cardiovascular: Negative.     Blood pressure (!) 152/76, pulse 90, temperature (!) 97.3 F (36.3 C), resp. rate 14, height 5\' 4"  (1.626 m), weight 142 lb (64.4 kg), SpO2 96 %.  Physical Exam Physical Exam Constitutional:      Appearance: She is well-developed.  Eyes:     General: No scleral icterus.    Conjunctiva/sclera: Conjunctivae normal.  Neck:     Musculoskeletal: Neck supple.  Cardiovascular:     Rate and Rhythm: Normal rate and regular rhythm.     Heart sounds: Normal heart sounds.  Pulmonary:     Effort: Pulmonary effort is normal.     Breath sounds: Normal breath sounds.  Chest:     Breasts:        Right: Skin change present. No inverted nipple, mass, nipple discharge or tenderness.        Left: No inverted nipple, mass, nipple discharge, skin change or tenderness.    Lymphadenopathy:     Cervical: No cervical adenopathy.  Skin:    General: Skin is warm and dry.  Neurological:     Mental Status: She is alert and oriented to person, place, and time.    Back  Untreated left breast  Treated right breast.    Data Reviewed Bilateral diagnostic mammograms of May 03, 2018 reviewed: BI-RADS-2.  Dermal evaluation and spring 2019 reported improvement with management for lichen planus.  Assessment No evidence of recurrent breast cancer.  Plan  The patient has seen Dr. Kellie Moor from dermatology in the past and will ask her to follow-up there to see if anything else can be done to help with the discoloration in the right breast, likely aggravating the underlying lichen planus with post wide excision radiation.  The patient has been asked to return to the office in one year with a bilateral diagnostic mammogram. The patient is aware to call back for any questions or concerns.  HPI, Physical  Exam, Assessment and Plan have been scribed under the direction and in the presence of Hervey Ard, MD.  Gaspar Cola, CMA  I have completed the exam and reviewed the above documentation for accuracy and completeness.  I agree with the above.  Haematologist has been used and any errors in dictation or transcription are unintentional.  Hervey Ard, M.D., F.A.C.S.  [name] Debanhi Blaker 05/09/2018, 9:17 PM

## 2018-05-11 ENCOUNTER — Telehealth: Payer: Self-pay

## 2018-05-11 DIAGNOSIS — H532 Diplopia: Secondary | ICD-10-CM | POA: Diagnosis not present

## 2018-05-11 DIAGNOSIS — H5340 Unspecified visual field defects: Secondary | ICD-10-CM | POA: Diagnosis not present

## 2018-05-11 NOTE — Telephone Encounter (Signed)
I spoke with Dr Gershon Crane office at [facility] and let them know what the patient needs to be seen for. They will contact the patient today to get her scheduled to be seen and will call us back with the appointment information.

## 2018-05-11 NOTE — Telephone Encounter (Signed)
-----   Message from Robert Bellow, MD sent at 05/09/2018  9:22 PM EST ----- Please arrange for patient to have a f/u evaluation for lichen planus w/ Karle Barr, MD from dermatology.

## 2018-05-16 DIAGNOSIS — R42 Dizziness and giddiness: Secondary | ICD-10-CM | POA: Diagnosis not present

## 2018-05-16 DIAGNOSIS — R55 Syncope and collapse: Secondary | ICD-10-CM | POA: Diagnosis not present

## 2018-05-16 DIAGNOSIS — H532 Diplopia: Secondary | ICD-10-CM | POA: Diagnosis not present

## 2018-05-18 ENCOUNTER — Other Ambulatory Visit: Payer: Self-pay | Admitting: Neurology

## 2018-05-18 DIAGNOSIS — R42 Dizziness and giddiness: Secondary | ICD-10-CM

## 2018-05-19 NOTE — Telephone Encounter (Signed)
The patient is scheduled with Dr Kellie Moor on 05/31/18 at 8:45 am.

## 2018-05-25 ENCOUNTER — Ambulatory Visit
Admission: RE | Admit: 2018-05-25 | Discharge: 2018-05-25 | Disposition: A | Discharge status: Home / Self Care | Payer: PPO | Source: Ambulatory Visit | Attending: Neurology | Admitting: Neurology

## 2018-05-25 ENCOUNTER — Other Ambulatory Visit: Payer: Self-pay

## 2018-05-25 DIAGNOSIS — R42 Dizziness and giddiness: Secondary | ICD-10-CM | POA: Diagnosis not present

## 2018-06-07 DIAGNOSIS — E782 Mixed hyperlipidemia: Secondary | ICD-10-CM | POA: Diagnosis not present

## 2018-06-07 DIAGNOSIS — I2510 Atherosclerotic heart disease of native coronary artery without angina pectoris: Secondary | ICD-10-CM | POA: Diagnosis not present

## 2018-06-07 DIAGNOSIS — N179 Acute kidney failure, unspecified: Secondary | ICD-10-CM | POA: Diagnosis not present

## 2018-06-07 DIAGNOSIS — J690 Pneumonitis due to inhalation of food and vomit: Secondary | ICD-10-CM | POA: Diagnosis not present

## 2018-06-07 DIAGNOSIS — W01198A Fall on same level from slipping, tripping and stumbling with subsequent striking against other object, initial encounter: Secondary | ICD-10-CM | POA: Diagnosis not present

## 2018-06-07 DIAGNOSIS — I739 Peripheral vascular disease, unspecified: Secondary | ICD-10-CM | POA: Diagnosis not present

## 2018-06-07 DIAGNOSIS — S0003XA Contusion of scalp, initial encounter: Secondary | ICD-10-CM | POA: Diagnosis not present

## 2018-06-07 DIAGNOSIS — R42 Dizziness and giddiness: Secondary | ICD-10-CM | POA: Diagnosis not present

## 2018-06-07 DIAGNOSIS — F1010 Alcohol abuse, uncomplicated: Secondary | ICD-10-CM | POA: Diagnosis not present

## 2018-06-07 DIAGNOSIS — S0990XA Unspecified injury of head, initial encounter: Secondary | ICD-10-CM | POA: Diagnosis not present

## 2018-06-07 DIAGNOSIS — Y998 Other external cause status: Secondary | ICD-10-CM | POA: Diagnosis not present

## 2018-06-07 DIAGNOSIS — I10 Essential (primary) hypertension: Secondary | ICD-10-CM | POA: Diagnosis not present

## 2018-06-07 DIAGNOSIS — E871 Hypo-osmolality and hyponatremia: Secondary | ICD-10-CM | POA: Diagnosis not present

## 2018-06-20 DIAGNOSIS — R42 Dizziness and giddiness: Secondary | ICD-10-CM | POA: Diagnosis not present

## 2018-06-20 DIAGNOSIS — R55 Syncope and collapse: Secondary | ICD-10-CM | POA: Diagnosis not present

## 2018-07-11 ENCOUNTER — Inpatient Hospital Stay: Payer: PPO | Admitting: Oncology

## 2018-08-07 DIAGNOSIS — M1711 Unilateral primary osteoarthritis, right knee: Secondary | ICD-10-CM | POA: Diagnosis not present

## 2018-08-10 ENCOUNTER — Telehealth: Payer: Self-pay

## 2018-08-10 NOTE — Telephone Encounter (Signed)
Pt called stating she dropped off a form to for a handicap placard .  She is wanting to know if it is ready yet.   Please advise.   Thanks,   -Mickel Baas

## 2018-08-10 NOTE — Telephone Encounter (Signed)
Dr. Darnell Level, have you done this yet? Please advise. Thanks!

## 2018-08-10 NOTE — Telephone Encounter (Signed)
Be ready monday

## 2018-08-11 NOTE — Telephone Encounter (Signed)
Done. Patient advise. Letter up front.

## 2018-08-24 ENCOUNTER — Other Ambulatory Visit: Payer: Self-pay

## 2018-08-24 ENCOUNTER — Ambulatory Visit (INDEPENDENT_AMBULATORY_CARE_PROVIDER_SITE_OTHER): Payer: PPO | Admitting: Family Medicine

## 2018-08-24 VITALS — BP 128/78 | HR 83 | Temp 98.0°F | Resp 18 | Wt 142.0 lb

## 2018-08-24 DIAGNOSIS — R42 Dizziness and giddiness: Secondary | ICD-10-CM | POA: Diagnosis not present

## 2018-08-24 DIAGNOSIS — I10 Essential (primary) hypertension: Secondary | ICD-10-CM

## 2018-08-24 DIAGNOSIS — E7800 Pure hypercholesterolemia, unspecified: Secondary | ICD-10-CM | POA: Diagnosis not present

## 2018-08-24 DIAGNOSIS — K257 Chronic gastric ulcer without hemorrhage or perforation: Secondary | ICD-10-CM

## 2018-08-24 DIAGNOSIS — E119 Type 2 diabetes mellitus without complications: Secondary | ICD-10-CM

## 2018-08-24 DIAGNOSIS — I2510 Atherosclerotic heart disease of native coronary artery without angina pectoris: Secondary | ICD-10-CM

## 2018-08-24 MED ORDER — LISINOPRIL 10 MG PO TABS: 10.0000 mg | ORAL_TABLET | Freq: Every day | ORAL | 3 refills | Status: DC

## 2018-08-24 NOTE — Progress Notes (Signed)
Patient: [name] [name] Female    DOB: Nov 06, 1940   78 y.o.   MRN: [id number] Visit Date: 08/24/2018  Today's Provider: Wilhemena Durie, MD   Chief Complaint  Patient presents with  . Follow-up   Subjective:     HPI 4 Month follow up for hypertension. Patient states that she is still having dizzy spells one to two times a week.  Allergies  Allergen Reactions  . Celebrex  [Celecoxib]     Dark stools.  . Penicillins Other (See Comments)    Childhood reaction Has patient had a PCN reaction causing immediate rash, facial/tongue/throat swelling, SOB or lightheadedness with hypotension: Unknown Has patient had a PCN reaction causing severe rash involving mucus membranes or skin necrosis: Unknown Has patient had a PCN reaction that required hospitalization: Unknown Has patient had a PCN reaction occurring within the last 10 years: Unknown If all of the above answers are "NO", then may proceed with Cephalosporin use.   Marland Kitchen Prevacid [Lansoprazole] Diarrhea     Current Outpatient Medications:  .  aspirin EC 81 MG tablet, Take 81 mg by mouth daily., Disp: , Rfl:  .  Clobetasol Prop Emollient Base 0.05 % emollient cream, as needed. , Disp: , Rfl:  .  lisinopril (PRINIVIL,ZESTRIL) 20 MG tablet, TAKE 1 TABLET BY MOUTH EVERY DAY, Disp: , Rfl:  .  omeprazole (PRILOSEC) 20 MG capsule, Take 1 capsule (20 mg total) by mouth daily as needed (indigestion)., Disp: 30 capsule, Rfl: 5 .  pravastatin (PRAVACHOL) 10 MG tablet, Take 10 mg by mouth daily., Disp: , Rfl:  .  triamcinolone cream (KENALOG) 0.1 %, APPLY TWICE DAILY TO RASH ON RIGHT BREAST UNTIL CLEAR, Disp: , Rfl: 0 .  metoprolol tartrate 37.5 MG TABS, Take 37.5 mg by mouth 2 (two) times daily. (Patient not taking: Reported on 08/24/2018), Disp: 60 tablet, Rfl: 0 .  nystatin cream (MYCOSTATIN), Apply 1 application topically 2 (two) times daily. (Patient not taking: Reported on 08/24/2018), Disp: 30 g, Rfl: 0  Review of Systems   Constitutional: Negative for activity change, appetite change, chills, diaphoresis, fatigue, fever and unexpected weight change.  HENT: Negative.   Eyes: Negative.   Respiratory: Negative for cough and shortness of breath.   Cardiovascular: Negative for chest pain, palpitations and leg swelling.  Gastrointestinal: Negative for abdominal distention, abdominal pain, anal bleeding, blood in stool, constipation, diarrhea, nausea, rectal pain and vomiting.       Heartburn positive/GERD symptoms  Endocrine: Negative.  Negative for cold intolerance, heat intolerance and polydipsia.  Genitourinary: Negative.   Musculoskeletal: Negative for back pain, joint swelling, myalgias and neck pain.  Skin: Negative.   Allergic/Immunologic: Negative.   Neurological: Positive for dizziness. Negative for syncope, weakness, light-headedness and headaches.  Hematological: Negative.   Psychiatric/Behavioral: Negative.     Social History   Tobacco Use  . Smoking status: Former Smoker    Packs/day: 0.25    Years: 10.00    Pack years: 2.50    Types: Cigarettes    Quit date: 03/06/1998    Years since quitting: 20.4  . Smokeless tobacco: Never Used  Substance Use Topics  . Alcohol use: No      Objective:   BP 128/78 (BP Location: Right Arm, Patient Position: Sitting, Cuff Size: Normal)   Pulse 83   Temp 98 F (36.7 C) (Oral)   Resp 18   Wt 142 lb (64.4 kg)   SpO2 99%   BMI 24.37 kg/m  Vitals:   08/24/18 1103  BP: 128/78  Pulse: 83  Resp: 18  Temp: 98 F (36.7 C)  TempSrc: Oral  SpO2: 99%  Weight: 142 lb (64.4 kg)     Physical Exam Vitals signs reviewed.  Constitutional:      Appearance: She is well-developed.  HENT:     Head: Normocephalic and atraumatic.     Right Ear: External ear normal.     Left Ear: External ear normal.     Nose: Nose normal.  Eyes:     General: No scleral icterus.    Conjunctiva/sclera: Conjunctivae normal.  Neck:     Thyroid: No thyromegaly.   Cardiovascular:     Rate and Rhythm: Normal rate and regular rhythm.     Heart sounds: Normal heart sounds.  Pulmonary:     Effort: Pulmonary effort is normal.     Breath sounds: Normal breath sounds.  Abdominal:     Palpations: Abdomen is soft.  Lymphadenopathy:     Cervical: No cervical adenopathy.  Skin:    General: Skin is warm and dry.  Neurological:     General: No focal deficit present.     Mental Status: She is alert and oriented to person, place, and time. Mental status is at baseline.  Psychiatric:        Mood and Affect: Mood normal.        Behavior: Behavior normal.        Thought Content: Thought content normal.        Judgment: Judgment normal.         Assessment & Plan    1. Essential (primary) hypertension Decrease lisinopril from 40 to 20mg  daily.  2. Arteriosclerosis of coronary artery   3. Gastric ulcer requiring drug therapy, chronic   4. Type 2 diabetes mellitus without complication, unspecified whether long term insulin use ([place])   5. Hypercholesteremia   6. Dizziness Possible combination of vertigo and orthostasis. Decrease ACEI dose.Push fluids.    I have done the exam and reviewed the above chart and it is accurate to the best of my knowledge. Development worker, community has been used in this note in any air is in the dictation or transcription are unintentional.  Wilhemena Durie, MD  [place]

## 2018-08-29 ENCOUNTER — Telehealth: Payer: Self-pay | Admitting: Family Medicine

## 2018-08-29 NOTE — Chronic Care Management (AMB) (Signed)
°  Chronic Care Management   Outreach Note  08/29/2018 Name: [name] [name] MRN: [id number] DOB: 06-05-1940  Referred by: Jerrol Banana., MD Reason for referral : No chief complaint on file.   An unsuccessful telephone outreach was attempted today. The patient was referred to the case management team by for assistance with chronic care management and care coordination.   Follow Up Plan: A HIPPA compliant phone message was left for the patient providing contact information and requesting a return call.  The care management team will reach out to the patient again over the next 7 days.  If patient returns call to provider office, please advise to call [place] at [place]  [email or web address]   ??[phone]

## 2018-09-05 ENCOUNTER — Telehealth: Payer: Self-pay | Admitting: *Deleted

## 2018-09-05 ENCOUNTER — Inpatient Hospital Stay: Payer: PPO

## 2018-09-05 ENCOUNTER — Inpatient Hospital Stay
Admission: EM | Admit: 2018-09-05 | Discharge: 2018-09-08 | DRG: 378 | Disposition: A | Discharge status: Home / Self Care | Payer: PPO | Attending: Internal Medicine | Admitting: Internal Medicine

## 2018-09-05 ENCOUNTER — Other Ambulatory Visit: Payer: Self-pay

## 2018-09-05 DIAGNOSIS — K635 Polyp of colon: Secondary | ICD-10-CM | POA: Diagnosis present

## 2018-09-05 DIAGNOSIS — E1151 Type 2 diabetes mellitus with diabetic peripheral angiopathy without gangrene: Secondary | ICD-10-CM | POA: Diagnosis not present

## 2018-09-05 DIAGNOSIS — Z801 Family history of malignant neoplasm of trachea, bronchus and lung: Secondary | ICD-10-CM | POA: Diagnosis not present

## 2018-09-05 DIAGNOSIS — Z170 Estrogen receptor positive status [ER+]: Secondary | ICD-10-CM | POA: Diagnosis not present

## 2018-09-05 DIAGNOSIS — Z7982 Long term (current) use of aspirin: Secondary | ICD-10-CM

## 2018-09-05 DIAGNOSIS — Z951 Presence of aortocoronary bypass graft: Secondary | ICD-10-CM

## 2018-09-05 DIAGNOSIS — E785 Hyperlipidemia, unspecified: Secondary | ICD-10-CM | POA: Diagnosis present

## 2018-09-05 DIAGNOSIS — Z8349 Family history of other endocrine, nutritional and metabolic diseases: Secondary | ICD-10-CM | POA: Diagnosis not present

## 2018-09-05 DIAGNOSIS — K449 Diaphragmatic hernia without obstruction or gangrene: Secondary | ICD-10-CM | POA: Diagnosis not present

## 2018-09-05 DIAGNOSIS — Z853 Personal history of malignant neoplasm of breast: Secondary | ICD-10-CM | POA: Diagnosis not present

## 2018-09-05 DIAGNOSIS — Z8371 Family history of colonic polyps: Secondary | ICD-10-CM | POA: Diagnosis not present

## 2018-09-05 DIAGNOSIS — I252 Old myocardial infarction: Secondary | ICD-10-CM | POA: Diagnosis not present

## 2018-09-05 DIAGNOSIS — Z955 Presence of coronary angioplasty implant and graft: Secondary | ICD-10-CM

## 2018-09-05 DIAGNOSIS — Z20828 Contact with and (suspected) exposure to other viral communicable diseases: Secondary | ICD-10-CM | POA: Diagnosis present

## 2018-09-05 DIAGNOSIS — K5730 Diverticulosis of large intestine without perforation or abscess without bleeding: Secondary | ICD-10-CM

## 2018-09-05 DIAGNOSIS — K5731 Diverticulosis of large intestine without perforation or abscess with bleeding: Principal | ICD-10-CM | POA: Diagnosis present

## 2018-09-05 DIAGNOSIS — D62 Acute posthemorrhagic anemia: Secondary | ICD-10-CM | POA: Diagnosis not present

## 2018-09-05 DIAGNOSIS — K210 Gastro-esophageal reflux disease with esophagitis: Secondary | ICD-10-CM | POA: Diagnosis present

## 2018-09-05 DIAGNOSIS — E782 Mixed hyperlipidemia: Secondary | ICD-10-CM | POA: Diagnosis not present

## 2018-09-05 DIAGNOSIS — E7800 Pure hypercholesterolemia, unspecified: Secondary | ICD-10-CM | POA: Diagnosis present

## 2018-09-05 DIAGNOSIS — K5790 Diverticulosis of intestine, part unspecified, without perforation or abscess without bleeding: Secondary | ICD-10-CM | POA: Diagnosis not present

## 2018-09-05 DIAGNOSIS — Z923 Personal history of irradiation: Secondary | ICD-10-CM | POA: Diagnosis not present

## 2018-09-05 DIAGNOSIS — K921 Melena: Secondary | ICD-10-CM | POA: Diagnosis not present

## 2018-09-05 DIAGNOSIS — R1084 Generalized abdominal pain: Secondary | ICD-10-CM | POA: Diagnosis not present

## 2018-09-05 DIAGNOSIS — Z880 Allergy status to penicillin: Secondary | ICD-10-CM | POA: Diagnosis not present

## 2018-09-05 DIAGNOSIS — I10 Essential (primary) hypertension: Secondary | ICD-10-CM | POA: Diagnosis present

## 2018-09-05 DIAGNOSIS — Z87891 Personal history of nicotine dependence: Secondary | ICD-10-CM | POA: Diagnosis not present

## 2018-09-05 DIAGNOSIS — Z03818 Encounter for observation for suspected exposure to other biological agents ruled out: Secondary | ICD-10-CM | POA: Diagnosis not present

## 2018-09-05 DIAGNOSIS — R42 Dizziness and giddiness: Secondary | ICD-10-CM | POA: Diagnosis not present

## 2018-09-05 DIAGNOSIS — R195 Other fecal abnormalities: Secondary | ICD-10-CM | POA: Diagnosis not present

## 2018-09-05 DIAGNOSIS — D122 Benign neoplasm of ascending colon: Secondary | ICD-10-CM | POA: Diagnosis not present

## 2018-09-05 DIAGNOSIS — I2510 Atherosclerotic heart disease of native coronary artery without angina pectoris: Secondary | ICD-10-CM | POA: Diagnosis present

## 2018-09-05 DIAGNOSIS — Z888 Allergy status to other drugs, medicaments and biological substances status: Secondary | ICD-10-CM

## 2018-09-05 DIAGNOSIS — Z8249 Family history of ischemic heart disease and other diseases of the circulatory system: Secondary | ICD-10-CM

## 2018-09-05 DIAGNOSIS — R52 Pain, unspecified: Secondary | ICD-10-CM | POA: Diagnosis not present

## 2018-09-05 DIAGNOSIS — K922 Gastrointestinal hemorrhage, unspecified: Secondary | ICD-10-CM | POA: Diagnosis present

## 2018-09-05 DIAGNOSIS — D649 Anemia, unspecified: Secondary | ICD-10-CM | POA: Diagnosis not present

## 2018-09-05 DIAGNOSIS — N179 Acute kidney failure, unspecified: Secondary | ICD-10-CM | POA: Diagnosis not present

## 2018-09-05 DIAGNOSIS — R197 Diarrhea, unspecified: Secondary | ICD-10-CM | POA: Diagnosis not present

## 2018-09-05 DIAGNOSIS — R58 Hemorrhage, not elsewhere classified: Secondary | ICD-10-CM | POA: Diagnosis not present

## 2018-09-05 DIAGNOSIS — K219 Gastro-esophageal reflux disease without esophagitis: Secondary | ICD-10-CM | POA: Diagnosis not present

## 2018-09-05 DIAGNOSIS — Z800 Family history of malignant neoplasm of digestive organs: Secondary | ICD-10-CM

## 2018-09-05 LAB — SARS CORONAVIRUS 2 BY RT PCR (HOSPITAL ORDER, PERFORMED IN ~~LOC~~ HOSPITAL LAB): SARS Coronavirus 2: NEGATIVE

## 2018-09-05 LAB — CBC
HCT: 28.4 % — ABNORMAL LOW (ref 36.0–46.0)
HCT: 27.7 % — ABNORMAL LOW (ref 36.0–46.0)
Hemoglobin: 8.9 g/dL — ABNORMAL LOW (ref 12.0–15.0)
Hemoglobin: 8.9 g/dL — ABNORMAL LOW (ref 12.0–15.0)
MCH: 30.1 pg (ref 26.0–34.0)
MCH: 30.7 pg (ref 26.0–34.0)
MCHC: 31.3 g/dL (ref 30.0–36.0)
MCHC: 32.1 g/dL (ref 30.0–36.0)
MCV: 95.9 fL (ref 80.0–100.0)
MCV: 95.5 fL (ref 80.0–100.0)
Platelets: 161 10*3/uL (ref 150–400)
Platelets: 137 10*3/uL — ABNORMAL LOW (ref 150–400)
RBC: 2.96 MIL/uL — ABNORMAL LOW (ref 3.87–5.11)
RBC: 2.9 MIL/uL — ABNORMAL LOW (ref 3.87–5.11)
RDW: 14.7 % (ref 11.5–15.5)
RDW: 15.1 % (ref 11.5–15.5)
WBC: 9.1 10*3/uL (ref 4.0–10.5)
WBC: 14.8 10*3/uL — ABNORMAL HIGH (ref 4.0–10.5)
nRBC: 0 % (ref 0.0–0.2)
nRBC: 0 % (ref 0.0–0.2)

## 2018-09-05 LAB — COMPREHENSIVE METABOLIC PANEL
ALT: 9 U/L (ref 0–44)
AST: 14 U/L — ABNORMAL LOW (ref 15–41)
Albumin: 2.9 g/dL — ABNORMAL LOW (ref 3.5–5.0)
Alkaline Phosphatase: 41 U/L (ref 38–126)
Anion gap: 7 (ref 5–15)
BUN: 32 mg/dL — ABNORMAL HIGH (ref 8–23)
CO2: 23 mmol/L (ref 22–32)
Calcium: 8 mg/dL — ABNORMAL LOW (ref 8.9–10.3)
Chloride: 113 mmol/L — ABNORMAL HIGH (ref 98–111)
Creatinine, Ser: 1.38 mg/dL — ABNORMAL HIGH (ref 0.44–1.00)
GFR calc Af Amer: 42 mL/min — ABNORMAL LOW (ref >60)
GFR calc non Af Amer: 36 mL/min — ABNORMAL LOW (ref >60)
Glucose, Bld: 183 mg/dL — ABNORMAL HIGH (ref 70–99)
Potassium: 3.8 mmol/L (ref 3.5–5.1)
Sodium: 143 mmol/L (ref 135–145)
Total Bilirubin: 0.5 mg/dL (ref 0.3–1.2)
Total Protein: 5.2 g/dL — ABNORMAL LOW (ref 6.5–8.1)

## 2018-09-05 LAB — PREPARE RBC (CROSSMATCH)

## 2018-09-05 LAB — ABO/RH: ABO/RH(D): A POS

## 2018-09-05 LAB — PROTIME-INR
INR: 1.1 (ref 0.8–1.2)
Prothrombin Time: 14.1 seconds (ref 11.4–15.2)

## 2018-09-05 LAB — TSH: TSH: 2.93 u[IU]/mL (ref 0.350–4.500)

## 2018-09-05 LAB — APTT: aPTT: 27 seconds (ref 24–36)

## 2018-09-05 LAB — GLUCOSE, CAPILLARY: Glucose-Capillary: 147 mg/dL — ABNORMAL HIGH (ref 70–99)

## 2018-09-05 MED ORDER — ACETAMINOPHEN 650 MG RE SUPP: 650.0000 mg | Freq: Four times a day (QID) | RECTAL | Status: DC | PRN

## 2018-09-05 MED ORDER — DILTIAZEM HCL 25 MG/5ML IV SOLN
INTRAVENOUS | Status: AC
  Filled 2018-09-05: qty 5

## 2018-09-05 MED ORDER — ONDANSETRON HCL 4 MG PO TABS: 4.0000 mg | ORAL_TABLET | Freq: Four times a day (QID) | ORAL | Status: DC | PRN

## 2018-09-05 MED ORDER — ACETAMINOPHEN 325 MG PO TABS: 650.0000 mg | ORAL_TABLET | Freq: Four times a day (QID) | ORAL | Status: DC | PRN

## 2018-09-05 MED ORDER — PRAVASTATIN SODIUM 20 MG PO TABS
10.0000 mg | ORAL_TABLET | Freq: Every day | ORAL | Status: DC
  Administered 2018-09-05 – 2018-09-08 (×3): 10 mg via ORAL
  Filled 2018-09-05 (×3): qty 1

## 2018-09-05 MED ORDER — SODIUM CHLORIDE 0.9 % IV SOLN
10.0000 mL/h | Freq: Once | INTRAVENOUS | Status: AC
  Administered 2018-09-05: 06:00:00 10 mL/h via INTRAVENOUS

## 2018-09-05 MED ORDER — SODIUM CHLORIDE 0.9 % IV BOLUS
500.0000 mL | Freq: Once | INTRAVENOUS | Status: AC
  Administered 2018-09-05: 11:00:00 500 mL via INTRAVENOUS

## 2018-09-05 MED ORDER — ONDANSETRON HCL 4 MG/2ML IJ SOLN
4.0000 mg | Freq: Four times a day (QID) | INTRAMUSCULAR | Status: DC | PRN
  Filled 2018-09-05: qty 2

## 2018-09-05 MED ORDER — DILTIAZEM HCL 25 MG/5ML IV SOLN
5.0000 mg | Freq: Once | INTRAVENOUS | Status: AC
  Administered 2018-09-05: 5 mg via INTRAVENOUS

## 2018-09-05 MED ORDER — SODIUM CHLORIDE 0.9 % IV BOLUS
1000.0000 mL | Freq: Once | INTRAVENOUS | Status: AC
  Administered 2018-09-05: 1000 mL via INTRAVENOUS

## 2018-09-05 MED ORDER — PROCHLORPERAZINE EDISYLATE 10 MG/2ML IJ SOLN
10.0000 mg | Freq: Four times a day (QID) | INTRAMUSCULAR | Status: DC | PRN
  Administered 2018-09-05: 06:00:00 10 mg via INTRAVENOUS
  Filled 2018-09-05 (×2): qty 2

## 2018-09-05 MED ORDER — ONDANSETRON HCL 4 MG/2ML IJ SOLN
4.0000 mg | Freq: Once | INTRAMUSCULAR | Status: AC
  Administered 2018-09-05: 4 mg via INTRAVENOUS

## 2018-09-05 MED ORDER — SODIUM CHLORIDE 0.9 % IV BOLUS
500.0000 mL | Freq: Once | INTRAVENOUS | Status: AC
  Administered 2018-09-05: 500 mL via INTRAVENOUS

## 2018-09-05 MED ORDER — TECHNETIUM TC 99M-LABELED RED BLOOD CELLS IV KIT
20.0000 | PACK | Freq: Once | INTRAVENOUS | Status: AC | PRN
  Administered 2018-09-05: 16:00:00 20.92 via INTRAVENOUS

## 2018-09-05 MED ORDER — PANTOPRAZOLE SODIUM 40 MG IV SOLR
40.0000 mg | Freq: Two times a day (BID) | INTRAVENOUS | Status: DC
  Administered 2018-09-05 – 2018-09-06 (×3): 40 mg via INTRAVENOUS
  Filled 2018-09-05 (×3): qty 40

## 2018-09-05 MED ORDER — BISACODYL 5 MG PO TBEC
10.0000 mg | DELAYED_RELEASE_TABLET | Freq: Once | ORAL | Status: AC
  Administered 2018-09-05: 10 mg via ORAL
  Filled 2018-09-05: qty 2

## 2018-09-05 MED ORDER — PEG 3350-KCL-NA BICARB-NACL 420 G PO SOLR
4000.0000 mL | Freq: Once | ORAL | Status: AC
  Administered 2018-09-05: 18:00:00 4000 mL via ORAL
  Filled 2018-09-05: qty 4000

## 2018-09-05 MED ORDER — LETROZOLE 2.5 MG PO TABS
2.5000 mg | ORAL_TABLET | Freq: Every day | ORAL | Status: DC
  Administered 2018-09-05 – 2018-09-08 (×3): 2.5 mg via ORAL
  Filled 2018-09-05 (×4): qty 1

## 2018-09-05 NOTE — Plan of Care (Signed)
No further bleeding noted.  Pt a&ox4. VSS. Pt scheduled for colonoscopy tomorrow. GoLytely initiated.

## 2018-09-05 NOTE — ED Triage Notes (Signed)
Pt to ED via EMS from home. Pt states starting today she has had bright red bloody diarrhea x5. Pt states she feels dizzy and has some chest tightness. Pt takes baby Asprin daily. Pt arrives hypotensive at 89/53

## 2018-09-05 NOTE — Progress Notes (Signed)
A CODE BLUE was called as patient was sitting on the bedside commode and found to be unresponsive.  Shortly thereafter patient regained consciousness but remained somewhat lethargic and followed commands.  Physical Exam:  Gen - lethargic female lying in bed following commands.  Heart - s1, s2 tachy Abd - soft, nt, nd, + BS no organomegaly Ext - no cyanosis, clubbing, edema b/l.   Assessment & Plan  1. Syncope/presyncope/vagal event -this was the cause of patient's unresponsiveness and lethargy. -Patient was on bedside commode and had this event.  Patient noted to have a heart rate maroon stool with clots and also some brown stool. -Patient is here due to a GI bleed. -Hemodynamically stable presently.  Maintaining her airway and not hypoxic.  Will give the patient a 500 cc IV fluid bolus, get a nuclear medicine GI bleeding scan.  Check a stat CBC. -Discussed plan of care with gastroenterology.  Time Spent: 30 min.

## 2018-09-05 NOTE — Consult Note (Signed)
[name] Antigua, MD [address] [place], [place], [place], [address] [address], [place], [place], [place], [address] Phone: [phone]  Fax: [phone]  Consultation  Referring Provider:     Dr. Marcille Blanco Primary Care Physician:  Jerrol Banana., MD Reason for Consultation:     Hematochezia  Date of Admission:  09/05/2018 Date of Consultation:  09/05/2018         HPI:   [name] [name] is a 78 y.o. female who started having hematochezia yesterday evening.  No previous history of GI bleed.  States has had 1-2 bowel movements with red to burgundy colored blood.  No melena.  No nausea or vomiting.  No abdominal pain.  Denies any NSAID use besides aspirin 81 mg daily.  No dysphagia.  No recent weight loss.  Patient had a colonoscopy in 2014.  Procedure report itself not available.  HM colonoscopy encounter under procedures as "diverticulosis, large diverticula".    As per previous GI notes by Dr. Percell Boston team in 2015, when she was evaluated for anemia: "Has had endscopic eval: EGDs 2008, 2010, 2010. 2008 with gastritis, no H pylori. 2010 with esophageal stenosis/La grade A esophagitis that was dilated, gastritis, duodenitis. 2012 with tortuous esophagus with stenosis, redilated. 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 Otherwise exam normal. Colonsocopy 2006 with adenoma. Colonoscopies 2010 and most recently 2014 with diverticula only. Brother had colon cancer."  They attributed to her anemia and to NSAIDs including Mobic and aspirin.  Placed her on PPI and Carafate.  I did not recommend endoscopy at the time due to no active bleeding.  Past Medical History:  Diagnosis Date  . Breast cancer ([place]) 2018   Right breast  . 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 Mucin noted on biopsy, not on wide excision.  . Coronary artery disease   . GERD (gastroesophageal reflux disease)   . Hyperlipidemia   . Hypertension   . Lichen  planus 70/26/3785   Right breast in field of whole breast radiation. Dr. Kellie Moor DX by punch bioppsy  . Lichen planus    right breast   . Lichen planus   . MI (myocardial infarction) ([place])    2015  . Personal history of radiation therapy 09/2016   RIGHT lumpectomy w/ radiation    Past Surgical History:  Procedure Laterality Date  . ABDOMINAL HYSTERECTOMY    . APPENDECTOMY    . BREAST BIOPSY Right 07/23/2016   INVASIVE MAMMARY CARCINOMA WITH AREAS OF EXTRACELLULAR MUCIN  . BREAST EXCISIONAL BIOPSY     INVASIVE MUCINOUS MAMMARY CARCINOMA.   Marland Kitchen BREAST LUMPECTOMY Right 08/20/2016   INVASIVE MUCINOUS MAMMARY CARCINOMA. /Grade 2   . COLONOSCOPY  2016  . CORONARY ANGIOPLASTY WITH STENT PLACEMENT    . CYSTOSCOPY    . OOPHORECTOMY    . PARTIAL MASTECTOMY WITH AXILLARY SENTINEL LYMPH NODE BIOPSY Right 08/09/2016   Procedure: PARTIAL MASTECTOMY WITH AXILLARY SENTINEL LYMPH NODE BIOPSY;  Surgeon: Robert Bellow, MD;  Location: [facility] ORS;  Service: General;  Laterality: Right;  . TONSILLECTOMY      Prior to Admission medications   Medication Sig Start Date End Date Taking? Authorizing Provider  aspirin EC 81 MG tablet Take 81 mg by mouth daily.   Yes [provider]  letrozole (FEMARA) 2.5 MG tablet Take 2.5 mg by mouth daily. 06/14/18  Yes [provider]  lisinopril (ZESTRIL) 10 MG tablet Take 1 tablet (10 mg  total) by mouth daily. 08/24/18  Yes Jerrol Banana., MD  nystatin cream (MYCOSTATIN) Apply 1 application topically 2 (two) times daily. 08/30/17  Yes Jerrol Banana., MD  omeprazole (PRILOSEC) 20 MG capsule Take 1 capsule (20 mg total) by mouth daily as needed (indigestion). 04/06/18  Yes Jerrol Banana., MD  pravastatin (PRAVACHOL) 10 MG tablet Take 10 mg by mouth daily.   Yes [provider]  Clobetasol Prop Emollient Base 0.05 % emollient cream as needed.  05/03/17   [provider]  triamcinolone cream (KENALOG) 0.1 % APPLY  TWICE DAILY TO RASH ON RIGHT BREAST UNTIL CLEAR 04/21/17   [provider]    Family History  Problem Relation Age of Onset  . Hyperlipidemia Mother   . Allergies Mother   . Cerebral aneurysm Mother        cause of death at age 27  . Heart disease Father        Fatal MI ag 66  . Cancer Brother        lung cancer  . Hyperlipidemia Brother   . Colonic polyp Brother   . Healthy Son   . Cancer - Lung Brother        colon  . Healthy Son   . Breast cancer Neg Hx      Social History   Tobacco Use  . Smoking status: Former Smoker    Packs/day: 0.25    Years: 10.00    Pack years: 2.50    Types: Cigarettes    Quit date: 03/06/1998    Years since quitting: 20.5  . Smokeless tobacco: Never Used  Substance Use Topics  . Alcohol use: No  . Drug use: No    Allergies as of 09/05/2018 - Review Complete 09/05/2018  Allergen Reaction Noted  . Celebrex  [celecoxib]  07/12/2014  . Penicillins Other (See Comments) 07/12/2014  . Prevacid [lansoprazole] Diarrhea 07/12/2014    Review of Systems:    All systems reviewed and negative except where noted in HPI.   Physical Exam:  Vital signs in last 24 hours: Vitals:   09/05/18 0500 09/05/18 0508 09/05/18 0554 09/05/18 1045  BP: (!) 111/56 127/68 (!) 119/59 (!) 85/47  Pulse: 96 93 84 84  Resp:  (!) 21  18  Temp:   98 F (36.7 C) 97.7 F (36.5 C)  TempSrc:   Oral Oral  SpO2: 98% 99%  97%  Weight:      Height:         General:   Pleasant, cooperative in NAD Head:  Normocephalic and atraumatic. Eyes:   No icterus.   Conjunctiva pink. PERRLA. Ears:  Normal auditory acuity. Neck:  Supple; no masses or thyroidomegaly Lungs: Respirations even and unlabored. Lungs clear to auscultation bilaterally.   No wheezes, crackles, or rhonchi.  Abdomen:  Soft, nondistended, nontender. Normal bowel sounds. No appreciable masses or hepatomegaly.  No rebound or guarding.  Neurologic:  Alert and oriented x3;  grossly normal  neurologically. Skin:  Intact without significant lesions or rashes. Cervical Nodes:  No significant cervical adenopathy. Psych:  Alert and cooperative. Normal affect.  LAB RESULTS: Recent Labs    09/05/18 0154  WBC 9.1  HGB 8.9*  HCT 28.4*  PLT 161   BMET Recent Labs    09/05/18 0154  NA 143  K 3.8  CL 113*  CO2 23  GLUCOSE 183*  BUN 32*  CREATININE 1.38*  CALCIUM 8.0*   LFT Recent Labs  09/05/18 0154  PROT 5.2*  ALBUMIN 2.9*  AST 14*  ALT 9  ALKPHOS 41  BILITOT 0.5   PT/INR Recent Labs    09/05/18 0605  LABPROT 14.1  INR 1.1    STUDIES: No results found.    Impression / Plan:   [name] [name] is a 78 y.o. y/o female with hematochezia and anemia  Symptoms are most likely due to underlying diverticulosis However, patient has previous history of duodenitis and previous EGDs as listed above.  We will plan on EGD and colonoscopy for evaluation of above symptoms  PPI IV twice daily  Continue serial CBCs and transfuse PRN Avoid NSAIDs Maintain 2 large-bore IV lines Please page GI with any acute hemodynamic changes, or signs of active GI bleeding  I have discussed alternative options, risks & benefits,  which include, but are not limited to, bleeding, infection, perforation,respiratory complication & drug reaction.  The patient agrees with this plan & written consent will be obtained.     Thank you for involving me in the care of this patient.      LOS: 0 days   Vir[place] Manifold, MD  09/05/2018, 12:47 PM

## 2018-09-05 NOTE — ED Provider Notes (Signed)
[facility] Emergency Department Provider Note  ____________________________________________  Time seen: Approximately 2:44 AM  I have reviewed the triage vital signs and the nursing notes.   HISTORY  Chief Complaint Melena    HPI [name] [name] is a 78 y.o. female with a history of breast cancer, GERD, hypertension who was in her usual state of health until about midnight tonight when she felt like she needed to have a bowel movement.  When she went to the bathroom she had a large bloody bowel movement.  She reports having 4 bloody bowel movements in quick succession.  She feels dizzy and describes some chest tightness as well.   Symptoms are constant.  No aggravating or alleviating factors.  She denies any abdominal pain nausea or vomiting.  No history of GI bleed.  She takes baby aspirin, but no anticoagulants.  No current chemotherapy.     Past Medical History:  Diagnosis Date  . Breast cancer ([place]) 2018   Right breast  . 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 Mucin noted on biopsy, not on wide excision.  . Coronary artery disease   . GERD (gastroesophageal reflux disease)   . Hyperlipidemia   . Hypertension   . Lichen planus 67/67/2094   Right breast in field of whole breast radiation. Dr. Kellie Moor DX by punch bioppsy  . Lichen planus    right breast   . Lichen planus   . MI (myocardial infarction) ([place])    2015  . Personal history of radiation therapy 09/2016   RIGHT lumpectomy w/ radiation     Patient Active Problem List   Diagnosis Date Noted  . Chest pain 03/31/2018  . Lichen planus 70/96/2836  . Malignant neoplasm of upper-inner quadrant of right breast in female, estrogen receptor positive ([place]) 07/28/2016  . Gastric ulcer requiring drug therapy, chronic 01/23/2016  . MI (mitral incompetence) 07/22/2015  . Combined fat and carbohydrate induced hyperlipemia 12/26/2014  . UTI  (lower urinary tract infection) 08/18/2014  . Blurry vision 08/18/2014  . Allergic rhinitis 07/12/2014  . Absolute anemia 07/12/2014  . Baker's cyst of knee 07/12/2014  . Atherosclerosis of coronary artery 07/12/2014  . CAFL (chronic airflow limitation) ([place]) 07/12/2014  . Dizziness 07/12/2014  . Essential (primary) hypertension 07/12/2014  . Acid reflux 07/12/2014  . 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 History of colon polyps 07/12/2014  . Hypercholesteremia 07/12/2014  . Malaise and fatigue 07/12/2014  . Heart attack ([place]) 07/12/2014  . Muscle ache 07/12/2014  . Arthritis, degenerative 07/12/2014  . Bradycardia 08/23/2013  . Arteriosclerosis of coronary artery 08/17/2013  . Diabetes ([place]) 08/17/2013  . Diabetes mellitus ([place]) 08/17/2013  . Peripheral vascular disease ([place]) 08/17/2013     Past Surgical History:  Procedure Laterality Date  . ABDOMINAL HYSTERECTOMY    . APPENDECTOMY    . BREAST BIOPSY Right 07/23/2016   INVASIVE MAMMARY CARCINOMA WITH AREAS OF EXTRACELLULAR MUCIN  . BREAST EXCISIONAL BIOPSY     INVASIVE MUCINOUS MAMMARY CARCINOMA.   Marland Kitchen BREAST LUMPECTOMY Right 08/20/2016   INVASIVE MUCINOUS MAMMARY CARCINOMA. /Grade 2   . COLONOSCOPY  2016  . CORONARY ANGIOPLASTY WITH STENT PLACEMENT    . CYSTOSCOPY    . OOPHORECTOMY    . PARTIAL MASTECTOMY WITH AXILLARY SENTINEL LYMPH NODE BIOPSY Right 08/09/2016   Procedure: PARTIAL MASTECTOMY WITH AXILLARY SENTINEL LYMPH NODE BIOPSY;  Surgeon: Robert Bellow, MD;  Location: [facility] ORS;  Service: General;  Laterality: Right;  .  TONSILLECTOMY       Prior to Admission medications   Medication Sig Start Date End Date Taking? Authorizing Provider  aspirin EC 81 MG tablet Take 81 mg by mouth daily.    [provider]  Clobetasol Prop Emollient Base 0.05 % emollient cream as needed.  05/03/17   [provider]  lisinopril (ZESTRIL) 10 MG tablet Take 1 tablet (10 mg total) by mouth daily. 08/24/18   Jerrol Banana., MD  metoprolol tartrate 37.5 MG TABS Take 37.5 mg by mouth 2 (two) times daily. Patient not taking: Reported on 08/24/2018 04/01/18   Mayo, Pete Pelt, MD  nystatin cream (MYCOSTATIN) Apply 1 application topically 2 (two) times daily. Patient not taking: Reported on 08/24/2018 08/30/17   Jerrol Banana., MD  omeprazole (PRILOSEC) 20 MG capsule Take 1 capsule (20 mg total) by mouth daily as needed (indigestion). 04/06/18   Jerrol Banana., MD  pravastatin (PRAVACHOL) 10 MG tablet Take 10 mg by mouth daily.    [provider]  triamcinolone cream (KENALOG) 0.1 % APPLY TWICE DAILY TO RASH ON RIGHT BREAST UNTIL CLEAR 04/21/17   [provider]     Allergies Celebrex  [celecoxib], Penicillins, and Prevacid [lansoprazole]   Family History  Problem Relation Age of Onset  . Hyperlipidemia Mother   . Allergies Mother   . Cerebral aneurysm Mother        cause of death at age 43  . Heart disease Father        Fatal MI ag 85  . Cancer Brother        lung cancer  . Hyperlipidemia Brother   . Colonic polyp Brother   . Healthy Son   . Cancer - Lung Brother        colon  . Healthy Son   . Breast cancer Neg Hx     Social History Social History   Tobacco Use  . Smoking status: Former Smoker    Packs/day: 0.25    Years: 10.00    Pack years: 2.50    Types: Cigarettes    Quit date: 03/06/1998    Years since quitting: 20.5  . Smokeless tobacco: Never Used  Substance Use Topics  . Alcohol use: No  . Drug use: No    Review of Systems  Constitutional:   No fever or chills.  ENT:   No sore throat. No rhinorrhea. Cardiovascular:   No chest pain or syncope. Respiratory:   No dyspnea or cough. Gastrointestinal:   Negative for abdominal pain or vomiting.  Positive bloody bowel movement Musculoskeletal:   Negative for focal pain or swelling All other systems reviewed and are negative except as documented above in ROS and  HPI.  ____________________________________________   PHYSICAL EXAM:  VITAL SIGNS: ED Triage Vitals  Enc Vitals Group     BP 09/05/18 0149 (!) 89/53     Pulse Rate 09/05/18 0149 94     Resp 09/05/18 0149 14     Temp 09/05/18 0149 97.9 F (36.6 C)     Temp Source 09/05/18 0149 Oral     SpO2 09/05/18 0149 100 %     Weight 09/05/18 0146 164 lb (74.4 kg)     Height 09/05/18 0146 5\' 4"  (1.626 m)     Head Circumference --      Peak Flow --      Pain Score 09/05/18 0146 0     Pain Loc --  Pain Edu? --      Excl. in [place]? --     Vital signs reviewed, nursing assessments reviewed.   Constitutional:   Alert and oriented. Non-toxic appearance. Eyes:   Conjunctivae are pale. EOMI. PERRL. ENT      Head:   Normocephalic and atraumatic.      Nose:   No congestion/rhinnorhea.       Mouth/Throat:   MMM, no pharyngeal erythema. No peritonsillar mass.       Neck:   No meningismus. Full ROM. Hematological/Lymphatic/Immunilogical:   No cervical lymphadenopathy. Cardiovascular:   RRR. Symmetric bilateral radial and DP pulses.  No murmurs. Cap refill less than 2 seconds. Respiratory:   Normal respiratory effort without tachypnea/retractions. Breath sounds are clear and equal bilaterally. No wheezes/rales/rhonchi. Gastrointestinal:   Soft and nontender. Non distended. There is no CVA tenderness.  No rebound, rigidity, or guarding.  Rectal exam performed with nurse at bedside, shows gross blood, maroon-colored.  Hemoccult positive  Musculoskeletal:   Normal range of motion in all extremities. No joint effusions.  No lower extremity tenderness.  No edema. Neurologic:   Normal speech and language.  Motor grossly intact. No acute focal neurologic deficits are appreciated.  Skin:    Skin is warm, dry and intact. No rash noted.  No petechiae, purpura, or bullae.  ____________________________________________    LABS (pertinent positives/negatives) (all labs ordered are listed, but only  abnormal results are displayed) Labs Reviewed  COMPREHENSIVE METABOLIC PANEL - Abnormal; Notable for the following components:      Result Value   Chloride 113 (*)    Glucose, Bld 183 (*)    BUN 32 (*)    Creatinine, Ser 1.38 (*)    Calcium 8.0 (*)    Total Protein 5.2 (*)    Albumin 2.9 (*)    AST 14 (*)    GFR calc non Af Amer 36 (*)    GFR calc Af Amer 42 (*)    All other components within normal limits  CBC - Abnormal; Notable for the following components:   RBC 2.96 (*)    Hemoglobin 8.9 (*)    HCT 28.4 (*)    All other components within normal limits  SARS CORONAVIRUS 2 (HOSPITAL ORDER, [place] [facility])  PROTIME-INR  APTT  POC OCCULT BLOOD, ED  TYPE AND SCREEN  PREPARE RBC (CROSSMATCH)   ____________________________________________   EKG  Interpreted by me Sinus rhythm rate of 92, normal axis and intervals.  Normal QRS ST segments and T waves.  ____________________________________________    RADIOLOGY  No results found.  ____________________________________________   PROCEDURES .Critical Care Performed by: Carrie Mew, MD Authorized by: Carrie Mew, MD   Critical care provider statement:    Critical care time (minutes):  35   Critical care time was exclusive of:  Separately billable procedures and treating other patients   Critical care was necessary to treat or prevent imminent or life-threatening deterioration of the following conditions:  Shock and circulatory failure   Critical care was time spent personally by me on the following activities:  Development of treatment plan with patient or surrogate, discussions with consultants, evaluation of patient's response to treatment, examination of patient, obtaining history from patient or surrogate, ordering and performing treatments and interventions, ordering and review of laboratory studies, ordering and review of radiographic studies, pulse oximetry, re-evaluation of  patient's condition and review of old charts    ____________________________________________    [place] / ASSESSMENT AND  PLAN / ED COURSE  Medications ordered in the ED: Medications  0.9 %  sodium chloride infusion (has no administration in time range)  sodium chloride 0.9 % bolus 1,000 mL (1,000 mLs Intravenous New Bag/Given 09/05/18 0213)    Pertinent labs & imaging results that were available during my care of the patient were reviewed by me and considered in my medical decision making (see chart for details).  [name] [name] was evaluated in Emergency Department on 09/05/2018 for the symptoms described in the history of present illness. She was evaluated in the context of the global COVID-19 pandemic, which necessitated consideration that the patient might be at risk for infection with the SARS-CoV-2 virus that causes COVID-19. Institutional protocols and algorithms that pertain to the evaluation of patients at risk for COVID-19 are in a state of rapid change based on information released by regulatory bodies including the CDC and federal and state organizations. These policies and algorithms were followed during the patient's care in the ED.   Patient presents with clinically apparent lower GI bleed, likely diverticular.  Blood pressure initially borderline 89/53 and some symptoms of acute anemia.  With IV fluids her blood pressure is improved to 105/55, but hemoglobin has returned at 8.9, down from a prior baseline of 13.  With this acute drop in her hemoglobin, symptoms, I suspect that serial hemoglobins will show a more profound anemia, and with the risk of hypotension and impending hemorrhagic shock, I will go ahead and start a transfusion with 1 unit of packed red blood cells.  Patient does provide verbal consent after discussing this with her.  Case discussed with the hospitalist for further management.      ____________________________________________   FINAL  CLINICAL IMPRESSION(S) / ED DIAGNOSES    Final diagnoses:  Acute lower GI bleeding  Acute blood loss anemia     ED Discharge Orders    None      Portions of this note were generated with dragon dictation software. Dictation errors may occur despite best attempts at proofreading.   Carrie Mew, MD 09/05/18 [phone]

## 2018-09-05 NOTE — Telephone Encounter (Signed)
FYI! Patient's family called to let Dr. Rosanna Randy know pt was in ED today.

## 2018-09-05 NOTE — Progress Notes (Signed)
Pt assisted by NT to the bsc. While on the bsc pt lost consciousness per NT.  NT called medical alert.  This Probation officer rushed to pt's room. Nursing staff present in room.  Pt had pulse and begun to awaken. Dr Verdell Carmine and [facility] in pt's room.  Pt a&O.  Hypertensive on bsc. Pt had medium bloody stool. Pt c/o nausea, zofran given. Pt was sweaty. CBG 147.  Pt assisted to the bed. 1/2 L bolus NS initiated. Bleeding scan pending. VSS at recheck once pt placed back to bed.

## 2018-09-05 NOTE — Progress Notes (Signed)
[facility] [place] NAME: [name] [name]    MR#:  [id number]  DATE OF BIRTH:  1941/02/08  SUBJECTIVE:   She presented to the hospital due to rectal bleeding and suspected lower GI bleed.  Hemoglobin has dropped but no acute need for transfusion presently.  She denies any abdominal pain nausea or vomiting.  REVIEW OF SYSTEMS:    Review of Systems  Constitutional: Negative for chills and fever.  HENT: Negative for congestion and tinnitus.   Eyes: Negative for blurred vision and double vision.  Respiratory: Negative for cough, shortness of breath and wheezing.   Cardiovascular: Negative for chest pain, orthopnea and PND.  Gastrointestinal: Positive for blood in stool. Negative for abdominal pain, diarrhea, nausea and vomiting.  Genitourinary: Negative for dysuria and hematuria.  Neurological: Negative for dizziness, sensory change and focal weakness.  All other systems reviewed and are negative.   Nutrition: Clear liquid Tolerating Diet: Yes Tolerating PT: Ambulatory  DRUG ALLERGIES:   Allergies  Allergen Reactions  . Celebrex  [Celecoxib]     Dark stools.  . Penicillins Other (See Comments)    Childhood reaction Has patient had a PCN reaction causing immediate rash, facial/tongue/throat swelling, SOB or lightheadedness with hypotension: Unknown Has patient had a PCN reaction causing severe rash involving mucus membranes or skin necrosis: Unknown Has patient had a PCN reaction that required hospitalization: Unknown Has patient had a PCN reaction occurring within the last 10 years: Unknown If all of the above answers are "NO", then may proceed with Cephalosporin use.   Marland Kitchen Prevacid [Lansoprazole] Diarrhea    VITALS:  Blood pressure 123/63, pulse 89, temperature 98.6 F (37 C), temperature source Oral, resp. rate 18, height 5\' 4"  (1.626 m), weight 74.4 kg, SpO2 100 %.  PHYSICAL EXAMINATION:   Physical Exam  GENERAL:  78  y.o.-year-old obese patient lying in bed in no acute distress.  EYES: Pupils equal, round, reactive to light and accommodation. No scleral icterus. Extraocular muscles intact.  HEENT: Head atraumatic, normocephalic. Oropharynx and nasopharynx clear.  NECK:  Supple, no jugular venous distention. No thyroid enlargement, no tenderness.  LUNGS: Normal breath sounds bilaterally, no wheezing, rales, rhonchi. No use of accessory muscles of respiration.  CARDIOVASCULAR: S1, S2 normal. No murmurs, rubs, or gallops.  ABDOMEN: Soft, nontender, nondistended. Bowel sounds present. No organomegaly or mass.  EXTREMITIES: No cyanosis, clubbing or edema b/l.    NEUROLOGIC: Cranial nerves II through XII are intact. No focal Motor or sensory deficits b/l.   PSYCHIATRIC: The patient is alert and oriented x 3.  SKIN: No obvious rash, lesion, or ulcer.    LABORATORY PANEL:   CBC Recent Labs  Lab 09/05/18 0154  WBC 9.1  HGB 8.9*  HCT 28.4*  PLT 161   ------------------------------------------------------------------------------------------------------------------  Chemistries  Recent Labs  Lab 09/05/18 0154  NA 143  K 3.8  CL 113*  CO2 23  GLUCOSE 183*  BUN 32*  CREATININE 1.38*  CALCIUM 8.0*  AST 14*  ALT 9  ALKPHOS 41  BILITOT 0.5   ------------------------------------------------------------------------------------------------------------------  Cardiac Enzymes No results for input(s): TROPONINI in the last 168 hours. ------------------------------------------------------------------------------------------------------------------  RADIOLOGY:  No results found.   ASSESSMENT AND PLAN:   78 year old female with past medical history of coronary artery disease status post bypass, history of breast cancer, hypertension, hyperlipidemia, previous history of MI who presents to the hospital due to multiple episodes of rectal bleeding.  1.  GI bleed-suspected to be  a lower GI bleed given  the patient's rectal bleeding. - Hemoglobin did drop to 8.9 from baseline around 13. -Hemodynamically stable.  Hold aspirin.  Continue IV Protonix, placed on clear liquid diet and seen by gastroenterology and plan for endoscopy/colonoscopy tomorrow.  2.  Acute blood loss anemia-secondary to the GI bleed. - Hemoglobin has dropped from baseline around 13-8.9 and patient has been transfused 1 unit of packed red blood cells.  Will follow serial hemoglobins.  Transfuse if hemoglobin less than 7.  3.  Acute kidney injury-secondary to GI bleed and volume loss. -Continue IV fluids, blood transfusion as needed.  Follow BUN/creatinine urine output.  4.  Essential hypertension-patient's blood pressures in the low side.  Hold lisinopril for now.  5.  History of breast cancer-continue Femara.  6.  Hyperlipidemia-continue Pravachol.   All the records are reviewed and case discussed with Care Management/Social Worker. Management plans discussed with the patient, family and they are in agreement.  CODE STATUS: Partial code  DVT Prophylaxis: Teds and SCDs  TOTAL TIME TAKING CARE OF THIS PATIENT: 30 minutes.   POSSIBLE D/C IN 1-2 DAYS, DEPENDING ON CLINICAL CONDITION.   Henreitta Leber M.D on 09/05/2018 at 1:56 PM  Between 7am to 6pm - Pager - [phone]  After 6pm go to [email or web address] - password [email or web address] [facility]  Office  [phone]  CC: Primary care physician; Jerrol Banana., MD

## 2018-09-05 NOTE — Telephone Encounter (Signed)
Please review

## 2018-09-05 NOTE — ED Notes (Signed)
ED TO INPATIENT HANDOFF REPORT  ED Nurse Name and Phone #: Terri Piedra [phone]  S Name/Age/Gender [name] [name] 78 y.o. female Room/Bed: ED19A/ED19A  Code Status   Code Status: Prior  Home/SNF/Other Home Patient oriented to: self, place, time and situation Is this baseline? Yes   Triage Complete: Triage complete  Chief Complaint Melena  Triage Note Pt to ED via EMS from home. Pt states starting today she has had bright red bloody diarrhea x5. Pt states she feels dizzy and has some chest tightness. Pt takes baby Asprin daily. Pt arrives hypotensive at 89/53   Allergies Allergies  Allergen Reactions  . Celebrex  [Celecoxib]     Dark stools.  . Penicillins Other (See Comments)    Childhood reaction Has patient had a PCN reaction causing immediate rash, facial/tongue/throat swelling, SOB or lightheadedness with hypotension: Unknown Has patient had a PCN reaction causing severe rash involving mucus membranes or skin necrosis: Unknown Has patient had a PCN reaction that required hospitalization: Unknown Has patient had a PCN reaction occurring within the last 10 years: Unknown If all of the above answers are "NO", then may proceed with Cephalosporin use.   Marland Kitchen Prevacid [Lansoprazole] Diarrhea    Level of Care/Admitting Diagnosis ED Disposition    ED Disposition Condition [facility]: [place] [[id number]]  Level of Care: Med-Surg [16]  Covid Evaluation: Confirmed COVID Negative  Diagnosis: GIB (gastrointestinal bleeding) [778242]  Admitting Physician: Harrie Foreman [[id number]]  Attending Physician: Harrie Foreman [[id number]]  Estimated length of stay: past midnight tomorrow  Certification:: I certify this patient will need inpatient services for at least 2 midnights  PT Class (Do Not Modify): Inpatient [101]  PT Acc Code (Do Not Modify): Private [1]       B Medical/Surgery History Past Medical History:  Diagnosis Date   . Breast cancer ([place]) 2018   Right breast  . 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 Mucin noted on biopsy, not on wide excision.  . Coronary artery disease   . GERD (gastroesophageal reflux disease)   . Hyperlipidemia   . Hypertension   . Lichen planus 67/61/9509   Right breast in field of whole breast radiation. Dr. Kellie Moor DX by punch bioppsy  . Lichen planus    right breast   . Lichen planus   . MI (myocardial infarction) ([place])    2015  . Personal history of radiation therapy 09/2016   RIGHT lumpectomy w/ radiation   Past Surgical History:  Procedure Laterality Date  . ABDOMINAL HYSTERECTOMY    . APPENDECTOMY    . BREAST BIOPSY Right 07/23/2016   INVASIVE MAMMARY CARCINOMA WITH AREAS OF EXTRACELLULAR MUCIN  . BREAST EXCISIONAL BIOPSY     INVASIVE MUCINOUS MAMMARY CARCINOMA.   Marland Kitchen BREAST LUMPECTOMY Right 08/20/2016   INVASIVE MUCINOUS MAMMARY CARCINOMA. /Grade 2   . COLONOSCOPY  2016  . CORONARY ANGIOPLASTY WITH STENT PLACEMENT    . CYSTOSCOPY    . OOPHORECTOMY    . PARTIAL MASTECTOMY WITH AXILLARY SENTINEL LYMPH NODE BIOPSY Right 08/09/2016   Procedure: PARTIAL MASTECTOMY WITH AXILLARY SENTINEL LYMPH NODE BIOPSY;  Surgeon: Robert Bellow, MD;  Location: [facility] ORS;  Service: General;  Laterality: Right;  . TONSILLECTOMY       A IV Location/Drains/Wounds Patient Lines/Drains/Airways Status   Active Line/Drains/Airways    Name:   Placement date:   Placement time:   Site:  Days:   Peripheral IV 09/05/18 Left Antecubital   09/05/18    0152    Antecubital   less than 1   Incision (Closed) 08/09/16 Breast Right   08/09/16    1521     757   Incision (Closed) 08/09/16 Axilla Right   08/09/16    1526     757          Intake/Output Last 24 hours  Intake/Output Summary (Last 24 hours) at 09/05/2018 0505 Last data filed at 09/05/2018 0017 Gross per 24 hour  Intake 320 ml  Output -  Net 320 ml     Labs/Imaging Results for orders placed or performed during the hospital encounter of 09/05/18 (from the past 48 hour(s))  Type and screen [facility]     Status: None (Preliminary result)   Collection Time: 09/05/18  1:53 AM  Result Value Ref Range   ABO/RH(D) A POS    Antibody Screen NEG    Sample Expiration 09/08/2018,2359    Unit Number [id number]    Blood Component Type RBC LR PHER1    Unit division 00    Status of Unit ISSUED    Transfusion Status OK TO TRANSFUSE    Crossmatch Result      Compatible Performed at [facility], [address] [place], [place] [address]   SARS Coronavirus 2 (CEPHEID - Performed in [facility] hospital lab), Hosp Order     Status: None   Collection Time: 09/05/18  1:53 AM   Specimen: Nasopharyngeal Swab  Result Value Ref Range   SARS Coronavirus 2 NEGATIVE NEGATIVE    Comment: (NOTE) If result is NEGATIVE SARS-CoV-2 target nucleic acids are NOT DETECTED. The SARS-CoV-2 RNA is generally detectable in upper and lower  respiratory specimens during the acute phase of infection. The lowest  concentration of SARS-CoV-2 viral copies this assay can detect is 250  copies / mL. A negative result does not preclude SARS-CoV-2 infection  and should not be used as the sole basis for treatment or other  patient management decisions.  A negative result may occur with  improper specimen collection / handling, submission of specimen other  than nasopharyngeal swab, presence of viral mutation(s) within the  areas targeted by this assay, and inadequate number of viral copies  (<250 copies / mL). A negative result must be combined with clinical  observations, patient history, and epidemiological information. If result is POSITIVE SARS-CoV-2 target nucleic acids are DETECTED. The SARS-CoV-2 RNA is generally detectable in upper and lower  respiratory specimens dur ing the acute phase of infection.  Positive  results are  indicative of active infection with SARS-CoV-2.  Clinical  correlation with patient history and other diagnostic information is  necessary to determine patient infection status.  Positive results do  not rule out bacterial infection or co-infection with other viruses. If result is PRESUMPTIVE POSTIVE SARS-CoV-2 nucleic acids MAY BE PRESENT.   A presumptive positive result was obtained on the submitted specimen  and confirmed on repeat testing.  While 2019 novel coronavirus  (SARS-CoV-2) nucleic acids may be present in the submitted sample  additional confirmatory testing may be necessary for epidemiological  and / or clinical management purposes  to differentiate between  SARS-CoV-2 and other Sarbecovirus currently known to infect humans.  If clinically indicated additional testing with an alternate test  methodology [phone]) is advised. The SARS-CoV-2 RNA is generally  detectable in upper and lower respiratory sp ecimens during the acute  phase of infection. The expected result is Negative. Fact Sheet for Patients:  StrictlyIdeas.no Fact Sheet for Healthcare Providers: BankingDealers.co.za This test is not yet approved or cleared by the [place] FDA and has been authorized for detection and/or diagnosis of SARS-CoV-2 by FDA under an Emergency Use Authorization (EUA).  This EUA will remain in effect (meaning this test can be used) for the duration of the COVID-19 declaration under Section 564(b)(1) of the Act, 21 U.S.C. section 360bbb-3(b)(1), unless the authorization is terminated or revoked sooner. Performed at [facility], [address] [place], [place] [address]   Comprehensive metabolic panel     Status: Abnormal   Collection Time: 09/05/18  1:54 AM  Result Value Ref Range   Sodium 143 135 - 145 mmol/L   Potassium 3.8 3.5 - 5.1 mmol/L   Chloride 113 (H) 98 - 111 mmol/L   CO2 23 22 - 32 mmol/L   Glucose, Bld  183 (H) 70 - 99 mg/dL   BUN 32 (H) 8 - 23 mg/dL   Creatinine, Ser 1.38 (H) 0.44 - 1.00 mg/dL   Calcium 8.0 (L) 8.9 - 10.3 mg/dL   Total Protein 5.2 (L) 6.5 - 8.1 g/dL   Albumin 2.9 (L) 3.5 - 5.0 g/dL   AST 14 (L) 15 - 41 U/L   ALT 9 0 - 44 U/L   Alkaline Phosphatase 41 38 - 126 U/L   Total Bilirubin 0.5 0.3 - 1.2 mg/dL   GFR calc non Af Amer 36 (L) >60 mL/min   GFR calc Af Amer 42 (L) >60 mL/min   Anion gap 7 5 - 15    Comment: Performed at [facility], [address] [place], [place] [address]  CBC     Status: Abnormal   Collection Time: 09/05/18  1:54 AM  Result Value Ref Range   WBC 9.1 4.0 - 10.5 K/uL   RBC 2.96 (L) 3.87 - 5.11 MIL/uL   Hemoglobin 8.9 (L) 12.0 - 15.0 g/dL   HCT 28.4 (L) 36.0 - 46.0 %   MCV 95.9 80.0 - 100.0 fL   MCH 30.1 26.0 - 34.0 pg   MCHC 31.3 30.0 - 36.0 g/dL   RDW 14.7 11.5 - 15.5 %   Platelets 161 150 - 400 K/uL   nRBC 0.0 0.0 - 0.2 %    Comment: Performed at [facility], [address], [place], [place] [address]  ABO/Rh     Status: None   Collection Time: 09/05/18  1:54 AM  Result Value Ref Range   ABO/RH(D)      A POS Performed at [facility], [address] [place], [place] [address]   Prepare RBC     Status: None   Collection Time: 09/05/18  2:43 AM  Result Value Ref Range   Order Confirmation      ORDER PROCESSED BY BLOOD BANK Performed at [facility], [address], [place], [place] [address]    No results found.  Pending Labs [employer] (From admission, onward)    Start     Ordered   09/05/18 0224  Protime-INR  Once,   STAT     09/05/18 0223   09/05/18 0224  APTT  ONCE - STAT,   STAT     09/05/18 0223   Signed and Held  TSH  Add-on,   R     Signed and Held   Signed and Held  Hemoglobin A1c  Add-on,   R  Signed and Held          Vitals/Pain Today's Vitals   09/05/18 0315 09/05/18 0324 09/05/18 0341 09/05/18 0448  BP:  125/61 122/65 (!) 170/63  Pulse: 60 77 82 100   Resp: 15 17  20   Temp:  97.9 F (36.6 C) 97.6 F (36.4 C) 97.8 F (36.6 C)  TempSrc:  Oral Oral   SpO2: 100% 100% 100% 98%  Weight:      Height:      PainSc:        Isolation Precautions No active isolations  Medications Medications  0.9 %  sodium chloride infusion (has no administration in time range)  diltiazem (CARDIZEM) 25 MG/5ML injection (has no administration in time range)  sodium chloride 0.9 % bolus 1,000 mL (1,000 mLs Intravenous New Bag/Given 09/05/18 0213)  diltiazem (CARDIZEM) injection 5 mg (5 mg Intravenous Given 09/05/18 0457)    Mobility walks Low fall risk   Focused Assessments cardiac   R Recommendations: See Admitting Provider Note  Report given to:   Additional Notes:

## 2018-09-05 NOTE — Chronic Care Management (AMB) (Signed)
°  Chronic Care Management   Outreach Note  09/05/2018 Name: [name] [name] MRN: [id number] DOB: 1940/11/25  Referred by: Jerrol Banana., MD Reason for referral : Chronic Care Management (Initial CCM outreach was unsuccessful. ) and Chronic Care Management (Second CCM outreach was unsuccessful )   A second unsuccessful telephone outreach was attempted today. The patient was referred to the case management team for assistance with chronic care management and care coordination.   Follow Up Plan: A HIPPA compliant phone message was left for the patient providing contact information and requesting a return call.  The care management team will reach out to the patient again over the next 7 days.  If patient returns call to provider office, please advise to call [place]  at [place]  [email or web address]   ??[phone]

## 2018-09-05 NOTE — ED Notes (Signed)
[name] [name] (husband) [phone]

## 2018-09-05 NOTE — H&P (Signed)
[name] [name] is an 78 y.o. female.   Chief Complaint: Rectal bleeding HPI: The patient with past medical history of CAD status post MI, hypertension and breast cancer presents to the emergency department complaining of rectal bleeding.  The patient reports seeing dark stools and having severe nausea and diarrhea.  He denies abdominal pain or painful defecation.  Symptoms began less than 12 hours ago.  Laboratory evaluation significant for acute kidney injury as well as a 5 g drop in hemoglobin.  A blood transfusion was ordered in the emergency department after which the hospitalist service was called for admission.  Past Medical History:  Diagnosis Date  . Breast cancer ([place]) 2018   Right breast  . 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 Mucin noted on biopsy, not on wide excision.  . Coronary artery disease   . GERD (gastroesophageal reflux disease)   . Hyperlipidemia   . Hypertension   . Lichen planus 64/40/3474   Right breast in field of whole breast radiation. Dr. Kellie Moor DX by punch bioppsy  . Lichen planus    right breast   . Lichen planus   . MI (myocardial infarction) ([place])    2015  . Personal history of radiation therapy 09/2016   RIGHT lumpectomy w/ radiation    Past Surgical History:  Procedure Laterality Date  . ABDOMINAL HYSTERECTOMY    . APPENDECTOMY    . BREAST BIOPSY Right 07/23/2016   INVASIVE MAMMARY CARCINOMA WITH AREAS OF EXTRACELLULAR MUCIN  . BREAST EXCISIONAL BIOPSY     INVASIVE MUCINOUS MAMMARY CARCINOMA.   Marland Kitchen BREAST LUMPECTOMY Right 08/20/2016   INVASIVE MUCINOUS MAMMARY CARCINOMA. /Grade 2   . COLONOSCOPY  2016  . CORONARY ANGIOPLASTY WITH STENT PLACEMENT    . CYSTOSCOPY    . OOPHORECTOMY    . PARTIAL MASTECTOMY WITH AXILLARY SENTINEL LYMPH NODE BIOPSY Right 08/09/2016   Procedure: PARTIAL MASTECTOMY WITH AXILLARY SENTINEL LYMPH NODE BIOPSY;  Surgeon: Robert Bellow, MD;  Location: [facility] ORS;   Service: General;  Laterality: Right;  . TONSILLECTOMY      Family History  Problem Relation Age of Onset  . Hyperlipidemia Mother   . Allergies Mother   . Cerebral aneurysm Mother        cause of death at age 62  . Heart disease Father        Fatal MI ag 66  . Cancer Brother        lung cancer  . Hyperlipidemia Brother   . Colonic polyp Brother   . Healthy Son   . Cancer - Lung Brother        colon  . Healthy Son   . Breast cancer Neg Hx    Social History:  reports that she quit smoking about 20 years ago. Her smoking use included cigarettes. She has a 2.50 pack-year smoking history. She has never used smokeless tobacco. She reports that she does not drink alcohol or use drugs.  Allergies:  Allergies  Allergen Reactions  . Celebrex  [Celecoxib]     Dark stools.  . Penicillins Other (See Comments)    Childhood reaction Has patient had a PCN reaction causing immediate rash, facial/tongue/throat swelling, SOB or lightheadedness with hypotension: Unknown Has patient had a PCN reaction causing severe rash involving mucus membranes or skin necrosis: Unknown Has patient had a PCN reaction that required hospitalization: Unknown Has patient had a PCN reaction occurring within the last 10 years: Unknown  If all of the above answers are "NO", then may proceed with Cephalosporin use.   Marland Kitchen Prevacid [Lansoprazole] Diarrhea    Medications Prior to Admission  Medication Sig Dispense Refill  . aspirin EC 81 MG tablet Take 81 mg by mouth daily.    Marland Kitchen letrozole (FEMARA) 2.5 MG tablet Take 2.5 mg by mouth daily.    Marland Kitchen lisinopril (ZESTRIL) 10 MG tablet Take 1 tablet (10 mg total) by mouth daily. 90 tablet 3  . nystatin cream (MYCOSTATIN) Apply 1 application topically 2 (two) times daily. 30 g 0  . omeprazole (PRILOSEC) 20 MG capsule Take 1 capsule (20 mg total) by mouth daily as needed (indigestion). 30 capsule 5  . pravastatin (PRAVACHOL) 10 MG tablet Take 10 mg by mouth daily.    .  Clobetasol Prop Emollient Base 0.05 % emollient cream as needed.     . triamcinolone cream (KENALOG) 0.1 % APPLY TWICE DAILY TO RASH ON RIGHT BREAST UNTIL CLEAR  0    Results for orders placed or performed during the hospital encounter of 09/05/18 (from the past 48 hour(s))  Type and screen [place]     Status: None (Preliminary result)   Collection Time: 09/05/18  1:53 AM  Result Value Ref Range   ABO/RH(D) A POS    Antibody Screen NEG    Sample Expiration 09/08/2018,2359    Unit Number [id number]    Blood Component Type RBC LR PHER1    Unit division 00    Status of Unit ISSUED    Transfusion Status OK TO TRANSFUSE    Crossmatch Result      Compatible Performed at [facility], [address], [place], [place] [address]   SARS Coronavirus 2 (CEPHEID - Performed in [place] hospital lab), Hosp Order     Status: None   Collection Time: 09/05/18  1:53 AM   Specimen: Nasopharyngeal Swab  Result Value Ref Range   SARS Coronavirus 2 NEGATIVE NEGATIVE    Comment: (NOTE) If result is NEGATIVE SARS-CoV-2 target nucleic acids are NOT DETECTED. The SARS-CoV-2 RNA is generally detectable in upper and lower  respiratory specimens during the acute phase of infection. The lowest  concentration of SARS-CoV-2 viral copies this assay can detect is 250  copies / mL. A negative result does not preclude SARS-CoV-2 infection  and should not be used as the sole basis for treatment or other  patient management decisions.  A negative result may occur with  improper specimen collection / handling, submission of specimen other  than nasopharyngeal swab, presence of viral mutation(s) within the  areas targeted by this assay, and inadequate number of viral copies  (<250 copies / mL). A negative result must be combined with clinical  observations, patient history, and epidemiological information. If result is POSITIVE SARS-CoV-2 target nucleic acids are  DETECTED. The SARS-CoV-2 RNA is generally detectable in upper and lower  respiratory specimens dur ing the acute phase of infection.  Positive  results are indicative of active infection with SARS-CoV-2.  Clinical  correlation with patient history and other diagnostic information is  necessary to determine patient infection status.  Positive results do  not rule out bacterial infection or co-infection with other viruses. If result is PRESUMPTIVE POSTIVE SARS-CoV-2 nucleic acids MAY BE PRESENT.   A presumptive positive result was obtained on the submitted specimen  and confirmed on repeat testing.  While 2019 novel coronavirus  (SARS-CoV-2) nucleic acids may be present in the submitted sample  additional confirmatory testing may be necessary for epidemiological  and / or clinical management purposes  to differentiate between  SARS-CoV-2 and other Sarbecovirus currently known to infect humans.  If clinically indicated additional testing with an alternate test  methodology [phone]) is advised. The SARS-CoV-2 RNA is generally  detectable in upper and lower respiratory sp ecimens during the acute  phase of infection. The expected result is Negative. Fact Sheet for Patients:  StrictlyIdeas.no Fact Sheet for Healthcare Providers: BankingDealers.co.za This test is not yet approved or cleared by the [place] FDA and has been authorized for detection and/or diagnosis of SARS-CoV-2 by FDA under an Emergency Use Authorization (EUA).  This EUA will remain in effect (meaning this test can be used) for the duration of the COVID-19 declaration under Section 564(b)(1) of the Act, 21 U.S.C. section 360bbb-3(b)(1), unless the authorization is terminated or revoked sooner. Performed at [facility] [place], [address] [place], [place] [address]   Comprehensive metabolic panel     Status: Abnormal   Collection Time: 09/05/18  1:54 AM  Result  Value Ref Range   Sodium 143 135 - 145 mmol/L   Potassium 3.8 3.5 - 5.1 mmol/L   Chloride 113 (H) 98 - 111 mmol/L   CO2 23 22 - 32 mmol/L   Glucose, Bld 183 (H) 70 - 99 mg/dL   BUN 32 (H) 8 - 23 mg/dL   Creatinine, Ser 1.38 (H) 0.44 - 1.00 mg/dL   Calcium 8.0 (L) 8.9 - 10.3 mg/dL   Total Protein 5.2 (L) 6.5 - 8.1 g/dL   Albumin 2.9 (L) 3.5 - 5.0 g/dL   AST 14 (L) 15 - 41 U/L   ALT 9 0 - 44 U/L   Alkaline Phosphatase 41 38 - 126 U/L   Total Bilirubin 0.5 0.3 - 1.2 mg/dL   GFR calc non Af Amer 36 (L) >60 mL/min   GFR calc Af Amer 42 (L) >60 mL/min   Anion gap 7 5 - 15    Comment: Performed at [facility], [address] [place], [place] [address]  CBC     Status: Abnormal   Collection Time: 09/05/18  1:54 AM  Result Value Ref Range   WBC 9.1 4.0 - 10.5 K/uL   RBC 2.96 (L) 3.87 - 5.11 MIL/uL   Hemoglobin 8.9 (L) 12.0 - 15.0 g/dL   HCT 28.4 (L) 36.0 - 46.0 %   MCV 95.9 80.0 - 100.0 fL   MCH 30.1 26.0 - 34.0 pg   MCHC 31.3 30.0 - 36.0 g/dL   RDW 14.7 11.5 - 15.5 %   Platelets 161 150 - 400 K/uL   nRBC 0.0 0.0 - 0.2 %    Comment: Performed at [facility], [address], [place], [place] [address]  ABO/Rh     Status: None   Collection Time: 09/05/18  1:54 AM  Result Value Ref Range   ABO/RH(D)      A POS Performed at [facility], [address] [place], [place] [address]   Prepare RBC     Status: None   Collection Time: 09/05/18  2:43 AM  Result Value Ref Range   Order Confirmation      ORDER PROCESSED BY BLOOD BANK Performed at [facility], [address], [place], [place] [address]    No results found.  Review of Systems  Constitutional: Negative for chills and fever.  HENT: Negative for sore throat and tinnitus.   Eyes: Negative for  blurred vision and redness.  Respiratory: Negative for cough and shortness of breath.   Cardiovascular: Negative for chest pain, palpitations, orthopnea and PND.  Gastrointestinal: Positive  for melena and nausea. Negative for abdominal pain, diarrhea and vomiting.  Genitourinary: Negative for dysuria, frequency and urgency.  Musculoskeletal: Negative for joint pain and myalgias.  Skin: Negative for rash.       No lesions  Neurological: Negative for speech change, focal weakness and weakness.  Endo/Heme/Allergies: Does not bruise/bleed easily.       No temperature intolerance  Psychiatric/Behavioral: Negative for depression and suicidal ideas.    Blood pressure 127/68, pulse 93, temperature 97.8 F (36.6 C), resp. rate (!) 21, height 5\' 4"  (1.626 m), weight 74.4 kg, SpO2 99 %. Physical Exam  Vitals reviewed. Constitutional: She is oriented to person, place, and time. She appears well-developed and well-nourished. No distress.  HENT:  Head: Normocephalic and atraumatic.  Mouth/Throat: Oropharynx is clear and moist.  Eyes: Pupils are equal, round, and reactive to light. Conjunctivae and EOM are normal. No scleral icterus.  Neck: Normal range of motion. Neck supple. No JVD present. No tracheal deviation present. No thyromegaly present.  Cardiovascular: Normal rate, regular rhythm and normal heart sounds. Exam reveals no gallop and no friction rub.  No murmur heard. Respiratory: Effort normal and breath sounds normal.  GI: Soft. Bowel sounds are normal. She exhibits no distension. There is no abdominal tenderness.  Genitourinary:    Genitourinary Comments: Deferred   Musculoskeletal: Normal range of motion.        General: No edema.  Lymphadenopathy:    She has no cervical adenopathy.  Neurological: She is alert and oriented to person, place, and time. No cranial nerve deficit. She exhibits normal muscle tone.  Skin: Skin is warm and dry. No rash noted. No erythema.  Psychiatric: She has a normal mood and affect. Her behavior is normal. Judgment and thought content normal.     Assessment/Plan This is a 78 year old female admitted for GI bleed. 1.  GI bleeding: Melena;  painless with associated nausea.  She is hemodynamically stable.  In fact her blood pressure is elevated.  We will try to control hypertension in order not to exacerbate bleeding.  Nonetheless maintain 2 large-bore IVs.  Continue blood transfusion. 2.  Hypertension: Uncontrolled; I have given the patient diltiazem 5 mg IV which has normalized her blood pressure.  I have held her lisinopril at this time.  Consider amlodipine when resuming the hypertensive medication. 3. Acute kidney injury: Prerenal; volume resuscitate with saline and blood.  Avoid nephrotoxic agents. 4. CAD: Stable; hold aspirin for now.  Monitor telemetry.  No ischemia on EKG or arrhythmias noted. 5.  Breast cancer: Status post chemo and radiation.  Continue Femara 6.  DVT prophylaxis: SCDs 7.  GI prophylaxis: IV pantoprazole The patient is a full code.  Time spent on admission orders and patient care approximately 45 minutes  Harrie Foreman, MD 09/05/2018, 5:52 AM

## 2018-09-05 NOTE — Progress Notes (Signed)
Around 1700 Nuclear med tech called this Probation officer and asked if pt was confused prior to scan. Per Nuclear med tech pt was asleep upon arrival and when woke up she was disoriented to place,situation and time and pulled out her IV from L hand.  This Probation officer went to nuclear med to check on pt.  Pt was awake and oriented to person,time and situation. She said she is at [facility]. Pt was reoriented. Calm and cooperative.

## 2018-09-06 ENCOUNTER — Inpatient Hospital Stay: Payer: PPO | Admitting: Anesthesiology

## 2018-09-06 ENCOUNTER — Encounter
Admission: EM | Disposition: A | Discharge status: Home / Self Care | Payer: Self-pay | Source: Home / Self Care | Attending: Internal Medicine

## 2018-09-06 DIAGNOSIS — K5730 Diverticulosis of large intestine without perforation or abscess without bleeding: Secondary | ICD-10-CM

## 2018-09-06 DIAGNOSIS — K635 Polyp of colon: Secondary | ICD-10-CM

## 2018-09-06 DIAGNOSIS — K449 Diaphragmatic hernia without obstruction or gangrene: Secondary | ICD-10-CM

## 2018-09-06 DIAGNOSIS — K921 Melena: Secondary | ICD-10-CM

## 2018-09-06 DIAGNOSIS — D122 Benign neoplasm of ascending colon: Secondary | ICD-10-CM

## 2018-09-06 HISTORY — PX: ESOPHAGOGASTRODUODENOSCOPY: SHX5428

## 2018-09-06 HISTORY — PX: COLONOSCOPY: SHX5424

## 2018-09-06 LAB — BASIC METABOLIC PANEL
Anion gap: 8 (ref 5–15)
BUN: 20 mg/dL (ref 8–23)
CO2: 21 mmol/L — ABNORMAL LOW (ref 22–32)
Calcium: 8.1 mg/dL — ABNORMAL LOW (ref 8.9–10.3)
Chloride: 115 mmol/L — ABNORMAL HIGH (ref 98–111)
Creatinine, Ser: 0.94 mg/dL (ref 0.44–1.00)
GFR calc Af Amer: >60 mL/min (ref >60)
GFR calc non Af Amer: 58 mL/min — ABNORMAL LOW (ref >60)
Glucose, Bld: 104 mg/dL — ABNORMAL HIGH (ref 70–99)
Potassium: 4 mmol/L (ref 3.5–5.1)
Sodium: 144 mmol/L (ref 135–145)

## 2018-09-06 LAB — CBC
HCT: 25.3 % — ABNORMAL LOW (ref 36.0–46.0)
Hemoglobin: 8 g/dL — ABNORMAL LOW (ref 12.0–15.0)
MCH: 30.8 pg (ref 26.0–34.0)
MCHC: 31.6 g/dL (ref 30.0–36.0)
MCV: 97.3 fL (ref 80.0–100.0)
Platelets: 124 10*3/uL — ABNORMAL LOW (ref 150–400)
RBC: 2.6 MIL/uL — ABNORMAL LOW (ref 3.87–5.11)
RDW: 15.5 % (ref 11.5–15.5)
WBC: 9.8 10*3/uL (ref 4.0–10.5)
nRBC: 0 % (ref 0.0–0.2)

## 2018-09-06 LAB — TROPONIN I (HIGH SENSITIVITY): Troponin I (High Sensitivity): 22 ng/L — ABNORMAL HIGH (ref <18)

## 2018-09-06 LAB — HEMOGLOBIN A1C
Hgb A1c MFr Bld: 6.2 % — ABNORMAL HIGH (ref 4.8–5.6)
Mean Plasma Glucose: 131 mg/dL

## 2018-09-06 LAB — HEMOGLOBIN: Hemoglobin: 8.8 g/dL — ABNORMAL LOW (ref 12.0–15.0)

## 2018-09-06 SURGERY — COLONOSCOPY
Anesthesia: General

## 2018-09-06 MED ORDER — PROPOFOL 10 MG/ML IV BOLUS
INTRAVENOUS | Status: DC | PRN
  Administered 2018-09-06: 40 mg via INTRAVENOUS

## 2018-09-06 MED ORDER — SODIUM CHLORIDE 0.9 % IV SOLN
INTRAVENOUS | Status: DC
  Administered 2018-09-06: 13:00:00 via INTRAVENOUS

## 2018-09-06 MED ORDER — LIDOCAINE HCL (CARDIAC) PF 100 MG/5ML IV SOSY
PREFILLED_SYRINGE | INTRAVENOUS | Status: DC | PRN
  Administered 2018-09-06: 60 mg via INTRAVENOUS

## 2018-09-06 MED ORDER — PROPOFOL 500 MG/50ML IV EMUL
INTRAVENOUS | Status: AC
  Filled 2018-09-06: qty 50

## 2018-09-06 MED ORDER — PHENYLEPHRINE HCL (PRESSORS) 10 MG/ML IV SOLN
INTRAVENOUS | Status: DC | PRN
  Administered 2018-09-06: 100 ug via INTRAVENOUS
  Administered 2018-09-06: 50 ug via INTRAVENOUS

## 2018-09-06 MED ORDER — PROPOFOL 500 MG/50ML IV EMUL
INTRAVENOUS | Status: DC | PRN
  Administered 2018-09-06: 150 ug/kg/min via INTRAVENOUS

## 2018-09-06 NOTE — Anesthesia Preprocedure Evaluation (Signed)
Anesthesia Evaluation  Patient identified by MRN, date of birth, ID band Patient awake    Reviewed: Allergy & Precautions, H&P , NPO status , Patient's Chart, lab work & pertinent test results  Airway Mallampati: II  TM Distance: >3 FB Neck ROM: full    Dental  (+) Missing   Pulmonary neg pulmonary ROS, neg COPD, neg recent URI, former smoker,           Cardiovascular hypertension, (-) angina+ CAD, + Past MI (13 yrs ago), + Cardiac Stents (13 yrs ago) and + Peripheral Vascular Disease       Neuro/Psych negative neurological ROS  negative psych ROS   GI/Hepatic Neg liver ROS, hiatal hernia, PUD, GERD  Controlled,  Endo/Other  diabetes  Renal/GU negative Renal ROS  negative genitourinary   Musculoskeletal   Abdominal   Peds  Hematology  (+) Blood dyscrasia, anemia , Hgb 8.0   Anesthesia Other Findings Past Medical History: 2018: Breast cancer ([place])     Comment:  Right breast 07/23/2016: Cancer ([place])     Comment:  T1b, N0; ER/PR+, 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 Mucin noted on biopsy, not on wide excision. No date: Coronary artery disease No date: GERD (gastroesophageal reflux disease) No date: Hyperlipidemia No date: Hypertension 55/97/4163: Lichen planus     Comment:  Right breast in field of whole breast radiation. Dr.               Kellie Moor DX by punch bioppsy No date: Lichen planus     Comment:  right breast  No date: Lichen planus No date: MI (myocardial infarction) [facility])     Comment:  2015 09/2016: Personal history of radiation therapy     Comment:  RIGHT lumpectomy w/ radiation  Past Surgical History: No date: ABDOMINAL HYSTERECTOMY No date: APPENDECTOMY 07/23/2016: BREAST BIOPSY; Right     Comment:  INVASIVE MAMMARY CARCINOMA WITH AREAS OF EXTRACELLULAR               MUCIN No date: BREAST EXCISIONAL BIOPSY     Comment:  INVASIVE MUCINOUS MAMMARY CARCINOMA.   08/20/2016: BREAST LUMPECTOMY; Right     Comment:  INVASIVE MUCINOUS MAMMARY CARCINOMA. /Grade 2  2016: COLONOSCOPY No date: CORONARY ANGIOPLASTY WITH STENT PLACEMENT No date: CYSTOSCOPY No date: OOPHORECTOMY 08/09/2016: PARTIAL MASTECTOMY WITH AXILLARY SENTINEL LYMPH NODE  BIOPSY; Right     Comment:  Procedure: PARTIAL MASTECTOMY WITH AXILLARY SENTINEL               LYMPH NODE BIOPSY;  Surgeon: Robert Bellow, MD;                Location: [facility] ORS;  Service: General;  Laterality:               Right; No date: TONSILLECTOMY  BMI    Body Mass Index: 25.01 kg/m      Reproductive/Obstetrics negative OB ROS                             Anesthesia Physical Anesthesia Plan  ASA: III  Anesthesia Plan: General   Post-op Pain Management:    Induction:   PONV Risk Score and Plan: Propofol infusion and TIVA  Airway Management Planned: Natural Airway and Nasal Cannula  Additional Equipment:   Intra-op Plan:   Post-operative Plan:   Informed Consent: I have reviewed  the patients History and Physical, chart, labs and discussed the procedure including the risks, benefits and alternatives for the proposed anesthesia with the patient or authorized representative who has indicated his/her understanding and acceptance.     Dental Advisory Given  Plan Discussed with: Anesthesiologist and CRNA  Anesthesia Plan Comments:         Anesthesia Quick Evaluation

## 2018-09-06 NOTE — Anesthesia Procedure Notes (Signed)
Date/Time: 09/06/2018 12:45 PM Performed by: Johnna Acosta, CRNA Pre-anesthesia Checklist: Patient identified, Emergency Drugs available, Suction available, Patient being monitored and Timeout performed Patient Re-evaluated:Patient Re-evaluated prior to induction Oxygen Delivery Method: Nasal cannula Preoxygenation: Pre-oxygenation with 100% oxygen Induction Type: IV induction

## 2018-09-06 NOTE — Anesthesia Post-op Follow-up Note (Signed)
Anesthesia QCDR form completed.        

## 2018-09-06 NOTE — Op Note (Signed)
[facility] Gastroenterology Patient Name: [name] [name] Procedure Date: 09/06/2018 12:23 PM MRN: [id number] Account #: [id number] Date of Birth: [birth year] Admit Type: Inpatient Age: 78 Room: [facility] ENDO ROOM 1 Gender: Female Note Status: Finalized Procedure:            Colonoscopy Indications:          Hematochezia Providers:            Shondell Poulson B. Bonna Gains MD, MD Referring MD:         Janine Ores. Rosanna Randy, MD (Referring MD) Medicines:            Monitored Anesthesia Care Complications:        No immediate complications. Procedure:            Pre-Anesthesia Assessment:                       - ASA Grade Assessment: III - A patient with severe                        systemic disease.                       - Prior to the procedure, a History and Physical was                        performed, and patient medications, allergies and                        sensitivities were reviewed. The patient's tolerance of                        previous anesthesia was reviewed.                       - The risks and benefits of the procedure and the                        sedation options and risks were discussed with the                        patient. All questions were answered and informed                        consent was obtained.                       - Patient identification and proposed procedure were                        verified prior to the procedure by the physician, the                        nurse, the anesthesiologist, the anesthetist and the                        technician. The procedure was verified in the procedure                        room.                       After obtaining informed  consent, the colonoscope was                        passed under direct vision. Throughout the procedure,                        the patient's blood pressure, pulse, and oxygen                        saturations were monitored continuously. The                         Colonoscope was introduced through the anus and                        advanced to the the cecum, identified by appendiceal                        orifice and ileocecal valve. The colonoscopy was                        performed with ease. The patient tolerated the                        procedure well. The quality of the bowel preparation                        was fair. Findings:      The perianal and digital rectal examinations were normal.      Two sessile polyps were found in the ascending colon. The polyps were 2       to 3 mm in size. These polyps were removed with a cold biopsy forceps.       Resection and retrieval were complete.      Clotted blood was found in the entire colon. There was evidence of old       blood throughout the colon. No active bleeding present.      Multiple diverticula were found in the sigmoid colon, transverse colon       and ascending colon. The diverticuli were more in number in the sigmoid       colon than elsewhere in the colon.      The exam was otherwise without abnormality.      The retroflexed view of the distal rectum and anal verge was normal and       showed no anal or rectal abnormalities. Impression:           - Preparation of the colon was fair.                       - Two 2 to 3 mm polyps in the ascending colon, removed                        with a cold biopsy forceps. Resected and retrieved.                       - Blood in the entire examined colon.                       - Patient's hematochezia was from diverticular bleeding  that has resolved at this time. No active bleeding                        present.                       - Diverticulosis in the sigmoid colon, in the                        transverse colon and in the ascending colon.                       - The examination was otherwise normal.                       - The distal rectum and anal verge are normal on                        retroflexion  view. Recommendation:       - High fiber diet.                       - Advance diet as tolerated.                       - Continue present medications.                       - Await pathology results.                       - Repeat colonoscopy date to be determined after                        pending pathology results are reviewed.                       - The findings and recommendations were discussed with                        the patient.                       - Return to primary care physician as previously                        scheduled.                       - Due to the fair prep, the examination was limited and                        underlying small or flat lesions or polyps could have                        been present underneath the stool. Procedure Code(s):    --- Professional ---                       [phone], Colonoscopy, flexible; with biopsy, single or                        multiple Diagnosis Code(s):    --- Professional ---  K63.5, Polyp of colon                       K92.2, Gastrointestinal hemorrhage, unspecified                       K92.1, Melena (includes Hematochezia)                       K57.30, Diverticulosis of large intestine without                        perforation or abscess without bleeding CPT copyright 2019 American Medical Association. All rights reserved. The codes documented in this report are preliminary and upon coder review may  be revised to meet current compliance requirements.  Vonda Antigua, MD Margretta Sidle B. Bonna Gains MD, MD 09/06/2018 1:32:57 PM This report has been signed electronically. Number of Addenda: 0 Note Initiated On: 09/06/2018 12:23 PM Scope Withdrawal Time: 0 hours 11 minutes 34 seconds  Total Procedure Duration: 0 hours 24 minutes 40 seconds  Estimated Blood Loss: Estimated blood loss: none.      [facility]

## 2018-09-06 NOTE — Plan of Care (Signed)
Pt s/p EGD and colonoscopy. No further bleeding noted post procedures. Pt tolerates cardiac diet. Denies pain. VSS.

## 2018-09-06 NOTE — Transfer of Care (Signed)
Immediate Anesthesia Transfer of Care Note  Patient: [name] [name]  Procedure(s) Performed: COLONOSCOPY (N/A ) ESOPHAGOGASTRODUODENOSCOPY (EGD) (N/A )  Patient Location: PACU  Anesthesia Type:General  Level of Consciousness: sedated  Airway & Oxygen Therapy: Patient Spontanous Breathing and Patient connected to nasal cannula oxygen  Post-op Assessment: Report given to RN and Post -op Vital signs reviewed and stable  Post vital signs: Reviewed and stable  Last Vitals:  Vitals Value Taken Time  BP 154/47 09/06/18 1334  Temp 36.1 C 09/06/18 1334  Pulse 75 09/06/18 1338  Resp 12 09/06/18 1338  SpO2 100 % 09/06/18 1338  Vitals shown include unvalidated device data.  Last Pain:  Vitals:   09/06/18 1334  TempSrc: Tympanic  PainSc: Asleep         Complications: No apparent anesthesia complications

## 2018-09-06 NOTE — Op Note (Signed)
[facility] Gastroenterology Patient Name: [name] [name] Procedure Date: 09/06/2018 12:27 PM MRN: [id number] Account #: [id number] Date of Birth: September 05, 1940 Admit Type: Inpatient Age: 78 Room: [facility] ENDO ROOM 1 Gender: Female Note Status: Finalized Procedure:            Upper GI endoscopy Indications:          Hematochezia Providers:            Kareema Keitt B. Bonna Gains MD, MD Referring MD:         Janine Ores. Rosanna Randy, MD (Referring MD) Medicines:            Monitored Anesthesia Care Complications:        No immediate complications. Procedure:            Pre-Anesthesia Assessment:                       - Prior to the procedure, a History and Physical was                        performed, and patient medications, allergies and                        sensitivities were reviewed. The patient's tolerance of                        previous anesthesia was reviewed.                       - The risks and benefits of the procedure and the                        sedation options and risks were discussed with the                        patient. All questions were answered and informed                        consent was obtained.                       - Patient identification and proposed procedure were                        verified prior to the procedure by the physician, the                        nurse, the anesthesiologist, the anesthetist and the                        technician. The procedure was verified in the procedure                        room.                       - ASA Grade Assessment: III - A patient with severe                        systemic disease.                       After  obtaining informed consent, the endoscope was                        passed under direct vision. Throughout the procedure,                        the patient's blood pressure, pulse, and oxygen                        saturations were monitored continuously. The Endoscope            was introduced through the mouth, and advanced to the                        second part of duodenum. The upper GI endoscopy was                        accomplished with ease. The patient tolerated the                        procedure well. Findings:      The examined esophagus was normal.      The entire examined stomach was normal.      A large hiatal hernia was present.      The duodenal bulb, second portion of the duodenum and examined duodenum       were normal. Impression:           - Normal esophagus.                       - Normal stomach.                       - Large hiatal hernia.                       - Normal duodenal bulb, second portion of the duodenum                        and examined duodenum.                       - No specimens collected. Recommendation:       - D/C PPI                       - Continue present medications.                       - Patient has a contact number available for                        emergencies. The signs and symptoms of potential                        delayed complications were discussed with the patient.                        Return to normal activities tomorrow. Written discharge                        instructions were provided to the patient.                       -  The findings and recommendations were discussed with                        the patient. Procedure Code(s):    --- Professional ---                       [id number], Esophagogastroduodenoscopy, flexible, transoral;                        diagnostic, including collection of specimen(s) by                        brushing or washing, when performed (separate procedure) Diagnosis Code(s):    --- Professional ---                       K44.9, Diaphragmatic hernia without obstruction or                        gangrene                       K92.1, Melena (includes Hematochezia) CPT copyright 2019 American Medical Association. All rights reserved. The codes documented  in this report are preliminary and upon coder review may  be revised to meet current compliance requirements.  Vonda Antigua, MD Margretta Sidle B. Bonna Gains MD, MD 09/06/2018 12:58:25 PM This report has been signed electronically. Number of Addenda: 0 Note Initiated On: 09/06/2018 12:27 PM Estimated Blood Loss: Estimated blood loss: none.      [facility]

## 2018-09-06 NOTE — Progress Notes (Signed)
[facility] [facility] [place] NAME: [name] [name]    MR#:  [id number]  DATE OF BIRTH:  27-Nov-1940  SUBJECTIVE:   The patient has melena and dark bloody stool during preparation for colonoscopy.  She denies any abdominal pain nausea or vomiting.  REVIEW OF SYSTEMS:    Review of Systems  Constitutional: Positive for malaise/fatigue. Negative for chills and fever.  HENT: Negative for congestion and tinnitus.   Eyes: Negative for blurred vision and double vision.  Respiratory: Negative for cough, shortness of breath and wheezing.   Cardiovascular: Negative for chest pain, orthopnea and PND.  Gastrointestinal: Positive for blood in stool and melena. Negative for abdominal pain, diarrhea, nausea and vomiting.  Genitourinary: Negative for dysuria and hematuria.  Musculoskeletal: Negative for back pain.  Skin: Negative for rash.  Neurological: Negative for dizziness, sensory change and focal weakness.  Psychiatric/Behavioral: Negative for depression. The patient is not nervous/anxious.   All other systems reviewed and are negative.   DRUG ALLERGIES:   Allergies  Allergen Reactions  . Celebrex  [Celecoxib]     Dark stools.  . Penicillins Other (See Comments)    Childhood reaction Has patient had a PCN reaction causing immediate rash, facial/tongue/throat swelling, SOB or lightheadedness with hypotension: Unknown Has patient had a PCN reaction causing severe rash involving mucus membranes or skin necrosis: Unknown Has patient had a PCN reaction that required hospitalization: Unknown Has patient had a PCN reaction occurring within the last 10 years: Unknown If all of the above answers are "NO", then may proceed with Cephalosporin use.   Marland Kitchen Prevacid [Lansoprazole] Diarrhea    VITALS:  Blood pressure 124/69, pulse 85, temperature 97.8 F (36.6 C), temperature source Oral, resp. rate 18, height 5\' 4"  (1.626 m), weight 66.1 kg, SpO2 100 %.   PHYSICAL EXAMINATION:   Physical Exam  GENERAL:  78 y.o.-year-old obese patient lying in bed in no acute distress.  EYES: Pupils equal, round, reactive to light and accommodation. No scleral icterus. Extraocular muscles intact.  HEENT: Head atraumatic, normocephalic. Oropharynx and nasopharynx clear.  NECK:  Supple, no jugular venous distention. No thyroid enlargement, no tenderness.  LUNGS: Normal breath sounds bilaterally, no wheezing, rales, rhonchi. No use of accessory muscles of respiration.  CARDIOVASCULAR: S1, S2 normal. No murmurs, rubs, or gallops.  ABDOMEN: Soft, nontender, nondistended. Bowel sounds present. No organomegaly or mass.  EXTREMITIES: No cyanosis, clubbing or edema b/l.    NEUROLOGIC: Cranial nerves II through XII are intact. No focal Motor or sensory deficits b/l.   PSYCHIATRIC: The patient is alert and oriented x 3.  SKIN: No obvious rash, lesion, or ulcer.    LABORATORY PANEL:   CBC Recent Labs  Lab 09/06/18 0617  WBC 9.8  HGB 8.0*  HCT 25.3*  PLT 124*   ------------------------------------------------------------------------------------------------------------------  Chemistries  Recent Labs  Lab 09/05/18 0154 09/06/18 0617  NA 143 144  K 3.8 4.0  CL 113* 115*  CO2 23 21*  GLUCOSE 183* 104*  BUN 32* 20  CREATININE 1.38* 0.94  CALCIUM 8.0* 8.1*  AST 14*  --   ALT 9  --   ALKPHOS 41  --   BILITOT 0.5  --    ------------------------------------------------------------------------------------------------------------------  Cardiac Enzymes No results for input(s): TROPONINI in the last 168 hours. ------------------------------------------------------------------------------------------------------------------  RADIOLOGY:  Nm Gi Blood Loss  Result Date: 09/05/2018 CLINICAL DATA:  78 year old female with dark red stools. EXAM: NUCLEAR MEDICINE GASTROINTESTINAL BLEEDING SCAN TECHNIQUE: Sequential abdominal  images were obtained following  intravenous administration of Tc-2m labeled red blood cells. RADIOPHARMACEUTICALS:  20.9 mCi Tc-68m pertechnetate in-vitro labeled red cells. COMPARISON:  CT Abdomen and Pelvis 01/21/2015. FINDINGS: Normal blood pool activity with gradual radiotracer accumulation in the urinary bladder. No abnormal accumulation to suggest active gastrointestinal bleeding. IMPRESSION: No evidence of acute GI bleeding. Electronically Signed   By: Genevie Ann M.D.   On: 09/05/2018 19:36     ASSESSMENT AND PLAN:   78 year old female with past medical history of coronary artery disease status post bypass, history of breast cancer, hypertension, hyperlipidemia, previous history of MI who presents to the hospital due to multiple episodes of rectal bleeding.  1.  GI bleed-suspected to be a lower GI bleed given the patient's rectal bleeding. - Hemoglobin did drop to 8.0 from baseline around 13. -Hemodynamically stable.  Hold aspirin.  Continue IV Protonix, placed on clear liquid diet and seen by gastroenterology and plan for endoscopy/colonoscopy today.  2.  Acute blood loss anemia-secondary to the GI bleed. - Hemoglobin has dropped from baseline around 13-8.9 and patient has been transfused 1 unit of packed red blood cells.  Will follow serial hemoglobins.  Transfuse if hemoglobin less than 7.  3.  Acute kidney injury-secondary to GI bleed and volume loss. Improved with IV fluid support.  4.  Essential hypertension-patient's blood pressures in the low side.  Hold lisinopril for now.  5.  History of breast cancer-continue Femara.  6.  Hyperlipidemia-continue Pravachol.  All the records are reviewed and case discussed with Care Management/Social Worker. Management plans discussed with the patient, family and they are in agreement.  CODE STATUS: Partial code  DVT Prophylaxis: Teds and SCDs  TOTAL TIME TAKING CARE OF THIS PATIENT: 33 minutes.   POSSIBLE D/C IN 2 DAYS, DEPENDING ON CLINICAL CONDITION.   Demetrios Loll  M.D on 09/06/2018 at 11:31 AM  Between 7am to 6pm - Pager - [phone]  After 6pm go to [email or web address] - password [email or web address] [facility]  Office  [phone]  CC: Primary care physician; Jerrol Banana., MD

## 2018-09-07 ENCOUNTER — Encounter: Payer: Self-pay | Admitting: Gastroenterology

## 2018-09-07 LAB — HEMOGLOBIN: Hemoglobin: 7.2 g/dL — ABNORMAL LOW (ref 12.0–15.0)

## 2018-09-07 LAB — PREPARE RBC (CROSSMATCH)

## 2018-09-07 MED ORDER — ACETAMINOPHEN 325 MG PO TABS
650.0000 mg | ORAL_TABLET | Freq: Once | ORAL | Status: AC
  Administered 2018-09-07: 650 mg via ORAL
  Filled 2018-09-07: qty 2

## 2018-09-07 MED ORDER — SODIUM CHLORIDE 0.9% IV SOLUTION
Freq: Once | INTRAVENOUS | Status: AC
  Administered 2018-09-07: 18:00:00 via INTRAVENOUS

## 2018-09-07 NOTE — Progress Notes (Signed)
[facility]'s Blood Consent electronically signed on 09/05/2018 by the pt and placed it in the chart

## 2018-09-07 NOTE — Care Management Important Message (Signed)
Important Message  Patient Details  Name: KLARISSA MCILVAIN MRN: [id number] Date of Birth: 09-20-1940   Medicare Important Message Given:  Yes  Initial Medicare IM given by Patient Access Associate on 09/07/2018 at 1:04pm. Still valid.   Dannette Barbara 09/07/2018, 3:21 PM

## 2018-09-07 NOTE — Progress Notes (Signed)
[facility] [place] NAME: Gratia Disla    MR#:  [id number]  DATE OF BIRTH:  October 11, 1940  SUBJECTIVE:   The patient has no complaints. REVIEW OF SYSTEMS:    Review of Systems  Constitutional: Positive for malaise/fatigue. Negative for chills and fever.  HENT: Negative for congestion and tinnitus.   Eyes: Negative for blurred vision and double vision.  Respiratory: Negative for cough, shortness of breath and wheezing.   Cardiovascular: Negative for chest pain, orthopnea and PND.  Gastrointestinal: Negative for abdominal pain, blood in stool, diarrhea, melena, nausea and vomiting.  Genitourinary: Negative for dysuria and hematuria.  Musculoskeletal: Negative for back pain.  Skin: Negative for rash.  Neurological: Negative for dizziness, sensory change and focal weakness.  Psychiatric/Behavioral: Negative for depression. The patient is not nervous/anxious.   All other systems reviewed and are negative.   DRUG ALLERGIES:   Allergies  Allergen Reactions  . Celebrex  [Celecoxib]     Dark stools.  . Penicillins Other (See Comments)    Childhood reaction Has patient had a PCN reaction causing immediate rash, facial/tongue/throat swelling, SOB or lightheadedness with hypotension: Unknown Has patient had a PCN reaction causing severe rash involving mucus membranes or skin necrosis: Unknown Has patient had a PCN reaction that required hospitalization: Unknown Has patient had a PCN reaction occurring within the last 10 years: Unknown If all of the above answers are "NO", then may proceed with Cephalosporin use.   Marland Kitchen Prevacid [Lansoprazole] Diarrhea    VITALS:  Blood pressure (!) 108/49, pulse 78, temperature 98.5 F (36.9 C), temperature source Oral, resp. rate 16, height 5\' 4"  (1.626 m), weight 66.1 kg, SpO2 98 %.  PHYSICAL EXAMINATION:   Physical Exam  GENERAL:  78 y.o.-year-old obese patient lying in bed in no acute distress.   EYES: Pupils equal, round, reactive to light and accommodation. No scleral icterus. Extraocular muscles intact.  HEENT: Head atraumatic, normocephalic. Oropharynx and nasopharynx clear.  NECK:  Supple, no jugular venous distention. No thyroid enlargement, no tenderness.  LUNGS: Normal breath sounds bilaterally, no wheezing, rales, rhonchi. No use of accessory muscles of respiration.  CARDIOVASCULAR: S1, S2 normal. No murmurs, rubs, or gallops.  ABDOMEN: Soft, nontender, nondistended. Bowel sounds present. No organomegaly or mass.  EXTREMITIES: No cyanosis, clubbing or edema b/l.    NEUROLOGIC: Cranial nerves II through XII are intact. No focal Motor or sensory deficits b/l.   PSYCHIATRIC: The patient is alert and oriented x 3.  SKIN: No obvious rash, lesion, or ulcer.    LABORATORY PANEL:   CBC Recent Labs  Lab 09/06/18 0617  09/07/18 0354  WBC 9.8  --   --   HGB 8.0*   < > 7.2*  HCT 25.3*  --   --   PLT 124*  --   --    < > = values in this interval not displayed.   ------------------------------------------------------------------------------------------------------------------  Chemistries  Recent Labs  Lab 09/05/18 0154 09/06/18 0617  NA 143 144  K 3.8 4.0  CL 113* 115*  CO2 23 21*  GLUCOSE 183* 104*  BUN 32* 20  CREATININE 1.38* 0.94  CALCIUM 8.0* 8.1*  AST 14*  --   ALT 9  --   ALKPHOS 41  --   BILITOT 0.5  --    ------------------------------------------------------------------------------------------------------------------  Cardiac Enzymes No results for input(s): TROPONINI in the last 168 hours. ------------------------------------------------------------------------------------------------------------------  RADIOLOGY:  Nm Gi Blood Loss  Result Date: 09/05/2018  CLINICAL DATA:  78 year old female with dark red stools. EXAM: NUCLEAR MEDICINE GASTROINTESTINAL BLEEDING SCAN TECHNIQUE: Sequential abdominal images were obtained following intravenous  administration of Tc-7m labeled red blood cells. RADIOPHARMACEUTICALS:  20.9 mCi Tc-85m pertechnetate in-vitro labeled red cells. COMPARISON:  CT Abdomen and Pelvis 01/21/2015. FINDINGS: Normal blood pool activity with gradual radiotracer accumulation in the urinary bladder. No abnormal accumulation to suggest active gastrointestinal bleeding. IMPRESSION: No evidence of acute GI bleeding. Electronically Signed   By: Genevie Ann M.D.   On: 09/05/2018 19:36     ASSESSMENT AND PLAN:   78 year old female with past medical history of coronary artery disease status post bypass, history of breast cancer, hypertension, hyperlipidemia, previous history of MI who presents to the hospital due to multiple episodes of rectal bleeding.  1.  GI bleed-suspected to be a lower GI bleed given the patient's rectal bleeding. - Hemoglobin did drop to 8.0 from baseline around 13. -Hemodynamically stable.  Hold aspirin.   She was treated with IV Protonix. EGD is unremarkable.  Colonoscopy report to polyps removed and diverticulosis.  2.  Acute blood loss anemia-secondary to the GI bleed. - Hemoglobin has dropped from baseline around 13-8.9 and patient was transfused 1 unit of packed red blood cells.  Hemoglobin dropped to 7.2 today.  1 more unit PRBC transfusion.  Follow-up hemoglobin in a.m.  3.  Acute kidney injury-secondary to GI bleed and volume loss. Improved with IV fluid support.  4.  Essential hypertension-patient's blood pressures in the low side.  Hold lisinopril for now.  5.  History of breast cancer-continue Femara.  6.  Hyperlipidemia-continue Pravachol.  All the records are reviewed and case discussed with Care Management/Social Worker. Management plans discussed with the patient, family and they are in agreement.  CODE STATUS: Partial code  DVT Prophylaxis: Teds and SCDs  TOTAL TIME TAKING CARE OF THIS PATIENT: 33 minutes.   POSSIBLE D/C IN 2 DAYS, DEPENDING ON CLINICAL CONDITION.   Demetrios Loll M.D on 09/07/2018 at 12:39 PM  Between 7am to 6pm - Pager - [phone]  After 6pm go to [email or web address] - password [email or web address] [facility]  Office  [phone]  CC: Primary care physician; Jerrol Banana., MD

## 2018-09-07 NOTE — Anesthesia Postprocedure Evaluation (Signed)
Anesthesia Post Note  Patient: [name] [name]  Procedure(s) Performed: COLONOSCOPY (N/A ) ESOPHAGOGASTRODUODENOSCOPY (EGD) (N/A )  Patient location during evaluation: PACU Anesthesia Type: General Level of consciousness: awake and alert Pain management: pain level controlled Vital Signs Assessment: post-procedure vital signs reviewed and stable Respiratory status: spontaneous breathing, nonlabored ventilation and respiratory function stable Cardiovascular status: blood pressure returned to baseline and stable Postop Assessment: no apparent nausea or vomiting Anesthetic complications: no     Last Vitals:  Vitals:   09/06/18 1943 09/07/18 0631  BP: 104/70 (!) 108/49  Pulse: 87 78  Resp: 16 16  Temp: 37.1 C 36.9 C  SpO2: 95% 98%    Last Pain:  Vitals:   09/07/18 0631  TempSrc: Oral  PainSc: 0-No pain                 Durenda Hurt

## 2018-09-07 NOTE — Discharge Instructions (Signed)
high-fiber diet with goal of 1-2 soft bowel movements daily.  If not at goal patient was advised to start taking MiraLAX and Metamucil

## 2018-09-07 NOTE — Progress Notes (Signed)
   09/07/18 1400  Clinical Encounter Type  Visited With Patient  Visit Type Initial  Spiritual Encounters  Spiritual Needs Emotional  Stress Factors  Patient Stress Factors Health changes  Ch was rounding. Upon the initial visit, pt said she was feeling down. Pt said she had been depressed before and that she might be feeling in similar ways now. Ch helped her understand the mind body relationship and explained how after what she had experienced last Sunday (bleeding) her mind also might have been affected by the traumatic event. Pt agreed and shared the wisdom of working with what you have and staying focused on the positives. Pt exhibits resilience and internal strength. Pt also has good support system of a husband of 92 years and two sons. While conversing, pt also got a call from one of the church members. Upon leaving, pt requested to talk to the doctor to ask a few questions and ch referred the case to the nurse secretary.

## 2018-09-07 NOTE — Plan of Care (Signed)
Nutrition Education Note   RD consulted to provide high fiber diet education for diverticulosis.   78 year old female with past medical history of coronary artery disease status post bypass, history of breast cancer, hypertension, hyperlipidemia, previous history of MI who presents to the hospital due to multiple episodes of rectal bleeding.  Spoke with pt via phone. Pt reports good appetite and oral intake; pt eating 100% of meals in hospital.    Provided "High Fiber Diet Education Handout" from the [employer] via mail. RD discussed with patient the importance of a high fiber diet in relation to diverticulosis. Explained the difference between diverticulosis and diverticulitis and explained fiber's role in prevention of diverticulitis. Explained the different types of fiber and gave patient examples of each. Provided pt with a handout listing different types of foods and their fiber content. Recommended slowly increasing fiber in the diet with a goal to aim for of 25-30g/day of fiber. Also recommended the use of fiber supplements if needed.   Suspect good compliance.   No further nutrition interventions warranted. Please re-consult if further help is needed.   Koleen Distance MS, RD, LDN Pager #- [phone] Office#- [phone] After Hours Pager: [phone]

## 2018-09-07 NOTE — Progress Notes (Signed)
Vonda Antigua, MD [address] [place], [place], [place], [address] [address], [place], [place], [place], [address] Phone: [phone]  Fax: [phone]   Subjective: Patient denies any further episodes of bleeding.  Tolerating solid food diet without difficulty.   Objective: Exam: Vital signs in last 24 hours: Vitals:   09/06/18 1507 09/06/18 1753 09/06/18 1943 09/07/18 0631  BP: (!) 155/67 (!) 122/92 104/70 (!) 108/49  Pulse: 85 93 87 78  Resp: 18 18 16 16   Temp: 97.7 F (36.5 C) 97.9 F (36.6 C) 98.8 F (37.1 C) 98.5 F (36.9 C)  TempSrc: Oral Oral Oral Oral  SpO2: 99% 92% 95% 98%  Weight:      Height:       Weight change: 0 kg  Intake/Output Summary (Last 24 hours) at 09/07/2018 1354 Last data filed at 09/07/2018 1344 Gross per 24 hour  Intake 480 ml  Output -  Net 480 ml    General: No acute distress, AAO x3 Abd: Soft, NT/ND, No HSM Skin: Warm, no rashes Neck: Supple, Trachea midline   Lab Results: Lab Results  Component Value Date   WBC 9.8 09/06/2018   HGB 7.2 (L) 09/07/2018   HCT 25.3 (L) 09/06/2018   MCV 97.3 09/06/2018   PLT 124 (L) 09/06/2018   Micro Results: Recent Results (from the past 240 hour(s))  SARS Coronavirus 2 (CEPHEID - Performed in [facility] hospital lab), Hosp Order     Status: None   Collection Time: 09/05/18  1:53 AM   Specimen: Nasopharyngeal Swab  Result Value Ref Range Status   SARS Coronavirus 2 NEGATIVE NEGATIVE Final    Comment: (NOTE) If result is NEGATIVE SARS-CoV-2 target nucleic acids are NOT DETECTED. The SARS-CoV-2 RNA is generally detectable in upper and lower  respiratory specimens during the acute phase of infection. The lowest  concentration of SARS-CoV-2 viral copies this assay can detect is 250  copies / mL. A negative result does not preclude SARS-CoV-2 infection  and should not be used as the sole basis for treatment or other  patient management decisions.  A negative result may occur  with  improper specimen collection / handling, submission of specimen other  than nasopharyngeal swab, presence of viral mutation(s) within the  areas targeted by this assay, and inadequate number of viral copies  (<250 copies / mL). A negative result must be combined with clinical  observations, patient history, and epidemiological information. If result is POSITIVE SARS-CoV-2 target nucleic acids are DETECTED. The SARS-CoV-2 RNA is generally detectable in upper and lower  respiratory specimens dur ing the acute phase of infection.  Positive  results are indicative of active infection with SARS-CoV-2.  Clinical  correlation with patient history and other diagnostic information is  necessary to determine patient infection status.  Positive results do  not rule out bacterial infection or co-infection with other viruses. If result is PRESUMPTIVE POSTIVE SARS-CoV-2 nucleic acids MAY BE PRESENT.   A presumptive positive result was obtained on the submitted specimen  and confirmed on repeat testing.  While 2019 novel coronavirus  (SARS-CoV-2) nucleic acids may be present in the submitted sample  additional confirmatory testing may be necessary for epidemiological  and / or clinical management purposes  to differentiate between  SARS-CoV-2 and other Sarbecovirus currently known to infect humans.  If clinically indicated additional testing with an alternate test  methodology [phone]) is advised. The SARS-CoV-2 RNA is generally  detectable in upper and lower respiratory sp ecimens during  the acute  phase of infection. The expected result is Negative. Fact Sheet for Patients:  StrictlyIdeas.no Fact Sheet for Healthcare Providers: BankingDealers.co.za This test is not yet approved or cleared by the [place] FDA and has been authorized for detection and/or diagnosis of SARS-CoV-2 by FDA under an Emergency Use Authorization (EUA).  This EUA  will remain in effect (meaning this test can be used) for the duration of the COVID-19 declaration under Section 564(b)(1) of the Act, 21 U.S.C. section 360bbb-3(b)(1), unless the authorization is terminated or revoked sooner. Performed at [facility], [address] [place], [place] [address]    Studies/Results: Nm Fabienne Bruns Blood Loss  Result Date: 09/05/2018 CLINICAL DATA:  78 year old female with dark red stools. EXAM: NUCLEAR MEDICINE GASTROINTESTINAL BLEEDING SCAN TECHNIQUE: Sequential abdominal images were obtained following intravenous administration of Tc-77m labeled red blood cells. RADIOPHARMACEUTICALS:  20.9 mCi Tc-66m pertechnetate in-vitro labeled red cells. COMPARISON:  CT Abdomen and Pelvis 01/21/2015. FINDINGS: Normal blood pool activity with gradual radiotracer accumulation in the urinary bladder. No abnormal accumulation to suggest active gastrointestinal bleeding. IMPRESSION: No evidence of acute GI bleeding. Electronically Signed   By: Genevie Ann M.D.   On: 09/05/2018 19:36   Medications:  Scheduled Meds: . sodium chloride   Intravenous Once  . acetaminophen  650 mg Oral Once  . letrozole  2.5 mg Oral Daily  . pravastatin  10 mg Oral Daily   Continuous Infusions: PRN Meds:.acetaminophen **OR** acetaminophen, ondansetron **OR** ondansetron (ZOFRAN) IV, prochlorperazine   Assessment: Active Problems:   GIB (gastrointestinal bleeding)   Hiatal hernia   Melena   Polyp of ascending colon   Diverticulosis of large intestine without diverticulitis    Plan: No further episodes of GI bleeding Source of GI bleeding was patient's diverticulosis which has resolved at this time Hemoglobin dropped slightly today but is expected to improve as she continues to have no further bleeding  Patient should follow a high-fiber diet with goal of 1-2 soft bowel movements daily.  If not at goal patient was advised to start taking MiraLAX and Metamucil and she verbalized  understanding  GI service will sign off P please page with any signs of active GI bleeding or any other concerns or questions   LOS: 2 days   Vonda Antigua, MD 09/07/2018, 1:54 PM

## 2018-09-08 LAB — TYPE AND SCREEN
ABO/RH(D): A POS
Antibody Screen: NEGATIVE
Unit division: 0
Unit division: 0

## 2018-09-08 LAB — BPAM RBC
Blood Product Expiration Date: 202007162359
Blood Product Expiration Date: 202007182359
ISSUE DATE / TIME: 202006300319
ISSUE DATE / TIME: 202007021744
Unit Type and Rh: 6200
Unit Type and Rh: 6200

## 2018-09-08 LAB — HEMOGLOBIN: Hemoglobin: 9.9 g/dL — ABNORMAL LOW (ref 12.0–15.0)

## 2018-09-08 NOTE — Discharge Summary (Signed)
[facility] [place] NAME: [name] [name]    MR#:  [id number]  DATE OF BIRTH:  [birth year]  DATE OF ADMISSION:  09/05/2018   ADMITTING PHYSICIAN: Harrie Foreman, MD  DATE OF DISCHARGE: 09/08/2018  PRIMARY CARE PHYSICIAN: Jerrol Banana., MD   ADMISSION DIAGNOSIS:  Acute blood loss anemia [D62] Acute lower GI bleeding [K92.2] DISCHARGE DIAGNOSIS:  Active Problems:   GIB (gastrointestinal bleeding)   Hiatal hernia   Melena   Polyp of ascending colon   Diverticulosis of large intestine without diverticulitis  SECONDARY DIAGNOSIS:   Past Medical History:  Diagnosis Date   Breast cancer ([place]) 2018   Right breast   Cancer ([place]) 07/23/2016   T1b, N0; ER/PR+, 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 Mucin noted on biopsy, not on wide excision.   Coronary artery disease    GERD (gastroesophageal reflux disease)    Hyperlipidemia    Hypertension    Lichen planus 96/28/3662   Right breast in field of whole breast radiation. Dr. Kellie Moor DX by punch bioppsy   Lichen planus    right breast    Lichen planus    MI (myocardial infarction) [facility])    2015   Personal history of radiation therapy 09/2016   RIGHT lumpectomy w/ radiation   HOSPITAL COURSE:  78 year old female with past medical history of coronary artery disease status post bypass, history of breast cancer, hypertension, hyperlipidemia, previous history of MI who presents to the hospital due to multiple episodes of rectal bleeding.  1.  GI bleed-suspected to be a lower GI bleed given the patient's rectal bleeding. - Hemoglobin did drop to 8.0 from baseline around 13. -Hemodynamically stable.  Hold aspirin.   She was treated with IV Protonix. EGD is unremarkable.  Colonoscopy report to polyps removed and diverticulosis.  2.  Acute blood loss anemia-secondary to the GI bleed. - Hemoglobin has dropped from baseline around 13-8.9 and patient was  transfused 1 unit of packed red blood cells.  Hemoglobin dropped to 7.2, s/p 1 more unit PRBC transfusion.  Follow-up hemoglobin 9.9.  3.  Acute kidney injury-secondary to GI bleed and volume loss. Improved with IV fluid support.  4.  Essential hypertension-patient's blood pressures in the low side.    BP is elevated, resume lisinopril.  5.  History of breast cancer-continue Femara.  6.  Hyperlipidemia-continue Pravachol.  DISCHARGE CONDITIONS:  Stable, discharge to home today. CONSULTS OBTAINED:   DRUG ALLERGIES:   Allergies  Allergen Reactions   Celebrex  [Celecoxib]     Dark stools.   Penicillins Other (See Comments)    Childhood reaction Has patient had a PCN reaction causing immediate rash, facial/tongue/throat swelling, SOB or lightheadedness with hypotension: Unknown Has patient had a PCN reaction causing severe rash involving mucus membranes or skin necrosis: Unknown Has patient had a PCN reaction that required hospitalization: Unknown Has patient had a PCN reaction occurring within the last 10 years: Unknown If all of the above answers are "NO", then may proceed with Cephalosporin use.    Prevacid [Lansoprazole] Diarrhea   DISCHARGE MEDICATIONS:   Allergies as of 09/08/2018      Reactions   Celebrex  [celecoxib]    Dark stools.   Penicillins Other (See Comments)   Childhood reaction Has patient had a PCN reaction causing immediate rash, facial/tongue/throat swelling, SOB or lightheadedness with hypotension: Unknown Has patient had a PCN reaction causing severe rash involving mucus membranes or skin necrosis:  Unknown Has patient had a PCN reaction that required hospitalization: Unknown Has patient had a PCN reaction occurring within the last 10 years: Unknown If all of the above answers are "NO", then may proceed with Cephalosporin use.   Prevacid [lansoprazole] Diarrhea      Medication List    STOP taking these medications   aspirin EC 81 MG tablet    triamcinolone cream 0.1 % Commonly known as: KENALOG     TAKE these medications   Clobetasol Prop Emollient Base 0.05 % emollient cream as needed.   letrozole 2.5 MG tablet Commonly known as: FEMARA Take 2.5 mg by mouth daily.   lisinopril 10 MG tablet Commonly known as: ZESTRIL Take 1 tablet (10 mg total) by mouth daily.   nystatin cream Commonly known as: MYCOSTATIN Apply 1 application topically 2 (two) times daily.   omeprazole 20 MG capsule Commonly known as: PRILOSEC Take 1 capsule (20 mg total) by mouth daily as needed (indigestion).   pravastatin 10 MG tablet Commonly known as: PRAVACHOL Take 10 mg by mouth daily.        DISCHARGE INSTRUCTIONS:  See AVS.  If you experience worsening of your admission symptoms, develop shortness of breath, life threatening emergency, suicidal or homicidal thoughts you must seek medical attention immediately by calling 911 or calling your MD immediately  if symptoms less severe.  You Must read complete instructions/literature along with all the possible adverse reactions/side effects for all the Medicines you take and that have been prescribed to you. Take any new Medicines after you have completely understood and accpet all the possible adverse reactions/side effects.   Please note  You were cared for by a hospitalist during your hospital stay. If you have any questions about your discharge medications or the care you received while you were in the hospital after you are discharged, you can call the unit and asked to speak with the hospitalist on call if the hospitalist that took care of you is not available. Once you are discharged, your primary care physician will handle any further medical issues. Please note that NO REFILLS for any discharge medications will be authorized once you are discharged, as it is imperative that you return to your primary care physician (or establish a relationship with a primary care physician if you do  not have one) for your aftercare needs so that they can reassess your need for medications and monitor your lab values.    On the day of Discharge:  VITAL SIGNS:  Blood pressure (!) 161/81, pulse 75, temperature 97.9 F (36.6 C), temperature source Oral, resp. rate 20, height 5\' 4"  (1.626 m), weight 64.6 kg, SpO2 100 %. PHYSICAL EXAMINATION:  GENERAL:  78 y.o.-year-old patient lying in the bed with no acute distress.  EYES: Pupils equal, round, reactive to light and accommodation. No scleral icterus. Extraocular muscles intact.  HEENT: Head atraumatic, normocephalic. Oropharynx and nasopharynx clear.  NECK:  Supple, no jugular venous distention. No thyroid enlargement, no tenderness.  LUNGS: Normal breath sounds bilaterally, no wheezing, rales,rhonchi or crepitation. No use of accessory muscles of respiration.  CARDIOVASCULAR: S1, S2 normal. No murmurs, rubs, or gallops.  ABDOMEN: Soft, non-tender, non-distended. Bowel sounds present. No organomegaly or mass.  EXTREMITIES: No pedal edema, cyanosis, or clubbing.  NEUROLOGIC: Cranial nerves II through XII are intact. Muscle strength 5/5 in all extremities. Sensation intact. Gait not checked.  PSYCHIATRIC: The patient is alert and oriented x 3.  SKIN: No obvious rash, lesion, or ulcer.  DATA REVIEW:   CBC Recent Labs  Lab 09/06/18 0617  09/08/18 0626  WBC 9.8  --   --   HGB 8.0*   < > 9.9*  HCT 25.3*  --   --   PLT 124*  --   --    < > = values in this interval not displayed.    Chemistries  Recent Labs  Lab 09/05/18 0154 09/06/18 0617  NA 143 144  K 3.8 4.0  CL 113* 115*  CO2 23 21*  GLUCOSE 183* 104*  BUN 32* 20  CREATININE 1.38* 0.94  CALCIUM 8.0* 8.1*  AST 14*  --   ALT 9  --   ALKPHOS 41  --   BILITOT 0.5  --      Microbiology Results  Results for orders placed or performed during the hospital encounter of 09/05/18  SARS Coronavirus 2 (CEPHEID - Performed in [facility] hospital lab), Hosp Order     Status:  None   Collection Time: 09/05/18  1:53 AM   Specimen: Nasopharyngeal Swab  Result Value Ref Range Status   SARS Coronavirus 2 NEGATIVE NEGATIVE Final    Comment: (NOTE) If result is NEGATIVE SARS-CoV-2 target nucleic acids are NOT DETECTED. The SARS-CoV-2 RNA is generally detectable in upper and lower  respiratory specimens during the acute phase of infection. The lowest  concentration of SARS-CoV-2 viral copies this assay can detect is 250  copies / mL. A negative result does not preclude SARS-CoV-2 infection  and should not be used as the sole basis for treatment or other  patient management decisions.  A negative result may occur with  improper specimen collection / handling, submission of specimen other  than nasopharyngeal swab, presence of viral mutation(s) within the  areas targeted by this assay, and inadequate number of viral copies  (<250 copies / mL). A negative result must be combined with clinical  observations, patient history, and epidemiological information. If result is POSITIVE SARS-CoV-2 target nucleic acids are DETECTED. The SARS-CoV-2 RNA is generally detectable in upper and lower  respiratory specimens dur ing the acute phase of infection.  Positive  results are indicative of active infection with SARS-CoV-2.  Clinical  correlation with patient history and other diagnostic information is  necessary to determine patient infection status.  Positive results do  not rule out bacterial infection or co-infection with other viruses. If result is PRESUMPTIVE POSTIVE SARS-CoV-2 nucleic acids MAY BE PRESENT.   A presumptive positive result was obtained on the submitted specimen  and confirmed on repeat testing.  While 2019 novel coronavirus  (SARS-CoV-2) nucleic acids may be present in the submitted sample  additional confirmatory testing may be necessary for epidemiological  and / or clinical management purposes  to differentiate between  SARS-CoV-2 and other  Sarbecovirus currently known to infect humans.  If clinically indicated additional testing with an alternate test  methodology [phone]) is advised. The SARS-CoV-2 RNA is generally  detectable in upper and lower respiratory sp ecimens during the acute  phase of infection. The expected result is Negative. Fact Sheet for Patients:  StrictlyIdeas.no Fact Sheet for Healthcare Providers: BankingDealers.co.za This test is not yet approved or cleared by the [place] FDA and has been authorized for detection and/or diagnosis of SARS-CoV-2 by FDA under an Emergency Use Authorization (EUA).  This EUA will remain in effect (meaning this test can be used) for the duration of the COVID-19 declaration under Section 564(b)(1) of the Act, 21 U.S.C. section 360bbb-3(b)(1), unless  the authorization is terminated or revoked sooner. Performed at [facility], [address] [place], [place] [address]     RADIOLOGY:  No results found.   Management plans discussed with the patient, family and they are in agreement.  CODE STATUS: Partial Code   TOTAL TIME TAKING CARE OF THIS PATIENT: 32 minutes.    Demetrios Loll M.D on 09/08/2018 at 11:36 AM  Between 7am to 6pm - Pager - [phone]  After 6pm go to [email or web address] - password [email or web address] [facility]  [facility]  Office  [phone]  CC: Primary care physician; Jerrol Banana., MD   Note: This dictation was prepared with Dragon dictation along with smaller phrase technology. Any transcriptional errors that result from this process are unintentional.

## 2018-09-08 NOTE — Progress Notes (Signed)
Pt d/c to home via husband. IV removed intact. VSS. Education completed. All belongings sent with pt. Pt said she did not have any questions for me at this time.

## 2018-09-09 ENCOUNTER — Inpatient Hospital Stay
Admission: EM | Admit: 2018-09-09 | Discharge: 2018-09-14 | DRG: 378 | Disposition: A | Discharge status: Home / Self Care | Payer: PPO | Attending: Internal Medicine | Admitting: Internal Medicine

## 2018-09-09 ENCOUNTER — Other Ambulatory Visit: Payer: Self-pay

## 2018-09-09 ENCOUNTER — Encounter: Payer: Self-pay | Admitting: *Deleted

## 2018-09-09 DIAGNOSIS — I6523 Occlusion and stenosis of bilateral carotid arteries: Secondary | ICD-10-CM | POA: Diagnosis not present

## 2018-09-09 DIAGNOSIS — Z853 Personal history of malignant neoplasm of breast: Secondary | ICD-10-CM

## 2018-09-09 DIAGNOSIS — R55 Syncope and collapse: Secondary | ICD-10-CM | POA: Diagnosis not present

## 2018-09-09 DIAGNOSIS — Z170 Estrogen receptor positive status [ER+]: Secondary | ICD-10-CM

## 2018-09-09 DIAGNOSIS — Z87891 Personal history of nicotine dependence: Secondary | ICD-10-CM | POA: Diagnosis not present

## 2018-09-09 DIAGNOSIS — E785 Hyperlipidemia, unspecified: Secondary | ICD-10-CM | POA: Diagnosis present

## 2018-09-09 DIAGNOSIS — Z90710 Acquired absence of both cervix and uterus: Secondary | ICD-10-CM | POA: Diagnosis not present

## 2018-09-09 DIAGNOSIS — Z955 Presence of coronary angioplasty implant and graft: Secondary | ICD-10-CM | POA: Diagnosis not present

## 2018-09-09 DIAGNOSIS — K219 Gastro-esophageal reflux disease without esophagitis: Secondary | ICD-10-CM | POA: Diagnosis not present

## 2018-09-09 DIAGNOSIS — I252 Old myocardial infarction: Secondary | ICD-10-CM | POA: Diagnosis not present

## 2018-09-09 DIAGNOSIS — D62 Acute posthemorrhagic anemia: Secondary | ICD-10-CM | POA: Diagnosis not present

## 2018-09-09 DIAGNOSIS — Z79899 Other long term (current) drug therapy: Secondary | ICD-10-CM | POA: Diagnosis not present

## 2018-09-09 DIAGNOSIS — Z923 Personal history of irradiation: Secondary | ICD-10-CM | POA: Diagnosis not present

## 2018-09-09 DIAGNOSIS — I2510 Atherosclerotic heart disease of native coronary artery without angina pectoris: Secondary | ICD-10-CM | POA: Diagnosis present

## 2018-09-09 DIAGNOSIS — K922 Gastrointestinal hemorrhage, unspecified: Secondary | ICD-10-CM

## 2018-09-09 DIAGNOSIS — K5791 Diverticulosis of intestine, part unspecified, without perforation or abscess with bleeding: Principal | ICD-10-CM | POA: Diagnosis present

## 2018-09-09 DIAGNOSIS — R0902 Hypoxemia: Secondary | ICD-10-CM | POA: Diagnosis not present

## 2018-09-09 DIAGNOSIS — R195 Other fecal abnormalities: Secondary | ICD-10-CM | POA: Diagnosis not present

## 2018-09-09 DIAGNOSIS — Z1159 Encounter for screening for other viral diseases: Secondary | ICD-10-CM

## 2018-09-09 DIAGNOSIS — I10 Essential (primary) hypertension: Secondary | ICD-10-CM | POA: Diagnosis present

## 2018-09-09 DIAGNOSIS — E7800 Pure hypercholesterolemia, unspecified: Secondary | ICD-10-CM | POA: Diagnosis not present

## 2018-09-09 DIAGNOSIS — Z79811 Long term (current) use of aromatase inhibitors: Secondary | ICD-10-CM | POA: Diagnosis not present

## 2018-09-09 DIAGNOSIS — R531 Weakness: Secondary | ICD-10-CM | POA: Diagnosis not present

## 2018-09-09 DIAGNOSIS — Z03818 Encounter for observation for suspected exposure to other biological agents ruled out: Secondary | ICD-10-CM | POA: Diagnosis not present

## 2018-09-09 LAB — CBC WITH DIFFERENTIAL/PLATELET
Abs Immature Granulocytes: 0.08 10*3/uL — ABNORMAL HIGH (ref 0.00–0.07)
Basophils Absolute: 0.1 10*3/uL (ref 0.0–0.1)
Basophils Relative: 1 %
Eosinophils Absolute: 0.1 10*3/uL (ref 0.0–0.5)
Eosinophils Relative: 1 %
HCT: 27.2 % — ABNORMAL LOW (ref 36.0–46.0)
Hemoglobin: 9 g/dL — ABNORMAL LOW (ref 12.0–15.0)
Immature Granulocytes: 1 %
Lymphocytes Relative: 14 %
Lymphs Abs: 1.7 10*3/uL (ref 0.7–4.0)
MCH: 30.9 pg (ref 26.0–34.0)
MCHC: 33.1 g/dL (ref 30.0–36.0)
MCV: 93.5 fL (ref 80.0–100.0)
Monocytes Absolute: 0.7 10*3/uL (ref 0.1–1.0)
Monocytes Relative: 6 %
Neutro Abs: 9.2 10*3/uL — ABNORMAL HIGH (ref 1.7–7.7)
Neutrophils Relative %: 77 %
Platelets: 161 10*3/uL (ref 150–400)
RBC: 2.91 MIL/uL — ABNORMAL LOW (ref 3.87–5.11)
RDW: 15.7 % — ABNORMAL HIGH (ref 11.5–15.5)
WBC: 11.8 10*3/uL — ABNORMAL HIGH (ref 4.0–10.5)
nRBC: 0 % (ref 0.0–0.2)

## 2018-09-09 NOTE — ED Triage Notes (Signed)
Per EMS report, Patient was recently discharged from the hospital for diverticulosis. Patient had an episode of near-syncope tonight while watching TV. Patient denies nausea, vomiting or diarrhea. Patient denies pain. Patient reports bright red blood when wiping after bowel movement.

## 2018-09-10 ENCOUNTER — Other Ambulatory Visit: Payer: Self-pay

## 2018-09-10 ENCOUNTER — Observation Stay: Payer: PPO

## 2018-09-10 ENCOUNTER — Observation Stay
Admit: 2018-09-10 | Discharge: 2018-09-10 | Disposition: A | Discharge status: Home / Self Care | Payer: PPO | Attending: Family Medicine | Admitting: Family Medicine

## 2018-09-10 DIAGNOSIS — I6523 Occlusion and stenosis of bilateral carotid arteries: Secondary | ICD-10-CM | POA: Diagnosis not present

## 2018-09-10 DIAGNOSIS — R55 Syncope and collapse: Secondary | ICD-10-CM

## 2018-09-10 DIAGNOSIS — R195 Other fecal abnormalities: Secondary | ICD-10-CM | POA: Diagnosis not present

## 2018-09-10 DIAGNOSIS — K922 Gastrointestinal hemorrhage, unspecified: Secondary | ICD-10-CM | POA: Diagnosis present

## 2018-09-10 LAB — COMPREHENSIVE METABOLIC PANEL
ALT: 14 U/L (ref 0–44)
AST: 19 U/L (ref 15–41)
Albumin: 3.2 g/dL — ABNORMAL LOW (ref 3.5–5.0)
Alkaline Phosphatase: 33 U/L — ABNORMAL LOW (ref 38–126)
Anion gap: 7 (ref 5–15)
BUN: 24 mg/dL — ABNORMAL HIGH (ref 8–23)
CO2: 27 mmol/L (ref 22–32)
Calcium: 8.3 mg/dL — ABNORMAL LOW (ref 8.9–10.3)
Chloride: 108 mmol/L (ref 98–111)
Creatinine, Ser: 1.01 mg/dL — ABNORMAL HIGH (ref 0.44–1.00)
GFR calc Af Amer: >60 mL/min (ref >60)
GFR calc non Af Amer: 53 mL/min — ABNORMAL LOW (ref >60)
Glucose, Bld: 131 mg/dL — ABNORMAL HIGH (ref 70–99)
Potassium: 3.5 mmol/L (ref 3.5–5.1)
Sodium: 142 mmol/L (ref 135–145)
Total Bilirubin: 0.7 mg/dL (ref 0.3–1.2)
Total Protein: 5.5 g/dL — ABNORMAL LOW (ref 6.5–8.1)

## 2018-09-10 LAB — CBC
HCT: 29.1 % — ABNORMAL LOW (ref 36.0–46.0)
Hemoglobin: 9.4 g/dL — ABNORMAL LOW (ref 12.0–15.0)
MCH: 30.8 pg (ref 26.0–34.0)
MCHC: 32.3 g/dL (ref 30.0–36.0)
MCV: 95.4 fL (ref 80.0–100.0)
Platelets: 159 10*3/uL (ref 150–400)
RBC: 3.05 MIL/uL — ABNORMAL LOW (ref 3.87–5.11)
RDW: 15.8 % — ABNORMAL HIGH (ref 11.5–15.5)
WBC: 11 10*3/uL — ABNORMAL HIGH (ref 4.0–10.5)
nRBC: 0 % (ref 0.0–0.2)

## 2018-09-10 LAB — HEMATOCRIT
HCT: 27.5 % — ABNORMAL LOW (ref 36.0–46.0)
HCT: 23.6 % — ABNORMAL LOW (ref 36.0–46.0)

## 2018-09-10 LAB — URINALYSIS, COMPLETE (UACMP) WITH MICROSCOPIC
Bacteria, UA: NONE SEEN
Bilirubin Urine: NEGATIVE
Glucose, UA: NEGATIVE mg/dL
Ketones, ur: NEGATIVE mg/dL
Nitrite: NEGATIVE
Protein, ur: NEGATIVE mg/dL
Specific Gravity, Urine: 1.017 (ref 1.005–1.030)
pH: 5 (ref 5.0–8.0)

## 2018-09-10 LAB — BASIC METABOLIC PANEL
Anion gap: 7 (ref 5–15)
BUN: 24 mg/dL — ABNORMAL HIGH (ref 8–23)
CO2: 26 mmol/L (ref 22–32)
Calcium: 8.3 mg/dL — ABNORMAL LOW (ref 8.9–10.3)
Chloride: 109 mmol/L (ref 98–111)
Creatinine, Ser: 0.97 mg/dL (ref 0.44–1.00)
GFR calc Af Amer: >60 mL/min (ref >60)
GFR calc non Af Amer: 56 mL/min — ABNORMAL LOW (ref >60)
Glucose, Bld: 125 mg/dL — ABNORMAL HIGH (ref 70–99)
Potassium: 3.8 mmol/L (ref 3.5–5.1)
Sodium: 142 mmol/L (ref 135–145)

## 2018-09-10 LAB — HEMOGLOBIN
Hemoglobin: 9 g/dL — ABNORMAL LOW (ref 12.0–15.0)
Hemoglobin: 7.6 g/dL — ABNORMAL LOW (ref 12.0–15.0)

## 2018-09-10 MED ORDER — LISINOPRIL 10 MG PO TABS
10.0000 mg | ORAL_TABLET | Freq: Every day | ORAL | Status: DC
  Administered 2018-09-10 – 2018-09-14 (×5): 10 mg via ORAL
  Filled 2018-09-10 (×5): qty 1

## 2018-09-10 MED ORDER — TRAZODONE HCL 50 MG PO TABS: 25.0000 mg | ORAL_TABLET | Freq: Every evening | ORAL | Status: DC | PRN

## 2018-09-10 MED ORDER — POTASSIUM CHLORIDE 20 MEQ PO PACK
40.0000 meq | PACK | Freq: Once | ORAL | Status: AC
  Administered 2018-09-10: 03:00:00 40 meq via ORAL
  Filled 2018-09-10: qty 2

## 2018-09-10 MED ORDER — ACETAMINOPHEN 325 MG PO TABS
650.0000 mg | ORAL_TABLET | Freq: Four times a day (QID) | ORAL | Status: DC | PRN
  Administered 2018-09-11 – 2018-09-13 (×2): 650 mg via ORAL
  Filled 2018-09-10 (×2): qty 2

## 2018-09-10 MED ORDER — SODIUM CHLORIDE 0.9% IV SOLUTION
Freq: Once | INTRAVENOUS | Status: AC
  Administered 2018-09-10: 20:00:00 via INTRAVENOUS

## 2018-09-10 MED ORDER — ONDANSETRON HCL 4 MG PO TABS: 4.0000 mg | ORAL_TABLET | Freq: Four times a day (QID) | ORAL | Status: DC | PRN

## 2018-09-10 MED ORDER — ACETAMINOPHEN 650 MG RE SUPP: 650.0000 mg | Freq: Four times a day (QID) | RECTAL | Status: DC | PRN

## 2018-09-10 MED ORDER — PANTOPRAZOLE SODIUM 40 MG IV SOLR
40.0000 mg | Freq: Two times a day (BID) | INTRAVENOUS | Status: DC
  Administered 2018-09-10 – 2018-09-14 (×10): 40 mg via INTRAVENOUS
  Filled 2018-09-10 (×10): qty 40

## 2018-09-10 MED ORDER — PRAVASTATIN SODIUM 20 MG PO TABS
10.0000 mg | ORAL_TABLET | Freq: Every day | ORAL | Status: DC
  Administered 2018-09-11 – 2018-09-13 (×2): 10 mg via ORAL
  Filled 2018-09-10 (×2): qty 1

## 2018-09-10 MED ORDER — ACETAMINOPHEN 325 MG PO TABS
650.0000 mg | ORAL_TABLET | Freq: Once | ORAL | Status: AC
  Administered 2018-09-10: 650 mg via ORAL
  Filled 2018-09-10: qty 2

## 2018-09-10 MED ORDER — LETROZOLE 2.5 MG PO TABS
2.5000 mg | ORAL_TABLET | Freq: Every day | ORAL | Status: DC
  Administered 2018-09-10 – 2018-09-14 (×5): 2.5 mg via ORAL
  Filled 2018-09-10 (×5): qty 1

## 2018-09-10 MED ORDER — PANTOPRAZOLE SODIUM 40 MG PO TBEC: 40.0000 mg | DELAYED_RELEASE_TABLET | Freq: Every day | ORAL | Status: DC

## 2018-09-10 MED ORDER — DIPHENHYDRAMINE HCL 25 MG PO CAPS
25.0000 mg | ORAL_CAPSULE | Freq: Once | ORAL | Status: AC
  Administered 2018-09-10: 21:00:00 25 mg via ORAL
  Filled 2018-09-10: qty 1

## 2018-09-10 MED ORDER — SODIUM CHLORIDE 0.9 % IV SOLN
INTRAVENOUS | Status: DC
  Administered 2018-09-10 (×2): via INTRAVENOUS

## 2018-09-10 MED ORDER — TECHNETIUM TC 99M-LABELED RED BLOOD CELLS IV KIT
20.0000 | PACK | Freq: Once | INTRAVENOUS | Status: AC | PRN
  Administered 2018-09-10: 15:00:00 21.175 via INTRAVENOUS

## 2018-09-10 MED ORDER — ONDANSETRON HCL 4 MG/2ML IJ SOLN
4.0000 mg | Freq: Four times a day (QID) | INTRAMUSCULAR | Status: DC | PRN
  Administered 2018-09-10: 4 mg via INTRAVENOUS
  Filled 2018-09-10: qty 2

## 2018-09-10 NOTE — ED Notes (Signed)
Patient's son Wilfred Lacy given an update time.

## 2018-09-10 NOTE — Plan of Care (Signed)
  Problem: Education: Goal: Knowledge of General Education information will improve Description Including pain rating scale, medication(s)/side effects and non-pharmacologic comfort measures Outcome: Progressing   Problem: Health Behavior/Discharge Planning: Goal: Ability to manage health-related needs will improve Outcome: Progressing   

## 2018-09-10 NOTE — Consult Note (Addendum)
Subjective:   CC: GI bleed  HPI:  [name] [name] is a 78 y.o. female who was consulted by Bridgett Larsson for issue above.  Symptoms were first noted 2 weeks ago. Painless, BRBPR.   Associated with nothing specific, exacerbated by nothing specific.  Patient has no other complaints except for some generalized weakness.  This is her second admission for the same issue.  Previous admission included a upper endoscopy and lower endoscopy which showed some diffuse diverticuli and evidence of old blood within the colon.  Unable to pinpoint exact source of bleeding.  Upper GI showed large hiatal hernia, but no evidence of bleeding.  Bleeding resolved at that time so she was sent home, but started having some bloody bowel movements again shortly afterwards therefore was readmitted.  GI does not believe any further endoscopic intervention will be of any benefit at this time.  Therefore, general surgery and vascular surgery were consulted.   Past Medical History:  has a past medical history of Breast cancer ([place]) (2018), Cancer ([place]) (07/23/2016), Coronary artery disease, GERD (gastroesophageal reflux disease), Hyperlipidemia, Hypertension, Lichen planus (81/85/6314), Lichen planus, Lichen planus, MI (myocardial infarction) ([place]), and Personal history of radiation therapy (09/2016).  Past Surgical History:  Past Surgical History:  Procedure Laterality Date  . ABDOMINAL HYSTERECTOMY    . APPENDECTOMY    . BREAST BIOPSY Right 07/23/2016   INVASIVE MAMMARY CARCINOMA WITH AREAS OF EXTRACELLULAR MUCIN  . BREAST EXCISIONAL BIOPSY     INVASIVE MUCINOUS MAMMARY CARCINOMA.   Marland Kitchen BREAST LUMPECTOMY Right 08/20/2016   INVASIVE MUCINOUS MAMMARY CARCINOMA. /Grade 2   . COLONOSCOPY  2016  . COLONOSCOPY N/A 09/06/2018   Procedure: COLONOSCOPY;  Surgeon: Virgel Manifold, MD;  Location: [facility] ENDOSCOPY;  Service: Endoscopy;  Laterality: N/A;  . CORONARY ANGIOPLASTY WITH STENT PLACEMENT    . CYSTOSCOPY    .  ESOPHAGOGASTRODUODENOSCOPY N/A 09/06/2018   Procedure: ESOPHAGOGASTRODUODENOSCOPY (EGD);  Surgeon: Virgel Manifold, MD;  [facility] ENDOSCOPY;  Service: Endoscopy;  Laterality: N/A;  . OOPHORECTOMY    . PARTIAL MASTECTOMY WITH AXILLARY SENTINEL LYMPH NODE BIOPSY Right 08/09/2016   Procedure: PARTIAL MASTECTOMY WITH AXILLARY SENTINEL LYMPH NODE BIOPSY;  Surgeon: Robert Bellow, MD;  Location: [facility] ORS;  Service: General;  Laterality: Right;  . TONSILLECTOMY      Family History: family history includes Allergies in her mother; Cancer in her brother; Cancer - Lung in her brother; Cerebral aneurysm in her mother; Colonic polyp in her brother; Healthy in her son and son; Heart disease in her father; Hyperlipidemia in her brother and mother.  Social History:  reports that she quit smoking about 20 years ago. Her smoking use included cigarettes. She has a 2.50 pack-year smoking history. She has never used smokeless tobacco. She reports that she does not drink alcohol or use drugs.  Current Medications:  Medications Prior to Admission  Medication Sig Dispense Refill  . letrozole (FEMARA) 2.5 MG tablet Take 2.5 mg by mouth daily.    Marland Kitchen lisinopril (ZESTRIL) 10 MG tablet Take 1 tablet (10 mg total) by mouth daily. 90 tablet 3  . nystatin cream (MYCOSTATIN) Apply 1 application topically 2 (two) times daily. 30 g 0  . pravastatin (PRAVACHOL) 10 MG tablet Take 10 mg by mouth daily.    . Clobetasol Prop Emollient Base 0.05 % emollient cream as needed.     Marland Kitchen omeprazole (PRILOSEC) 20 MG capsule Take 1 capsule (20 mg total) by mouth daily as needed (indigestion). 30 capsule 5  Allergies:  Allergies as of 09/09/2018 - Review Complete 09/09/2018  Allergen Reaction Noted  . Celebrex  [celecoxib]  07/12/2014  . Penicillins Other (See Comments) 07/12/2014  . Prevacid [lansoprazole] Diarrhea 07/12/2014    ROS:  General: Denies weight loss, weight gain, fatigue, fevers, chills, and night  sweats. Eyes: Denies blurry vision, double vision, eye pain, itchy eyes, and tearing. Ears: Denies hearing loss, earache, and ringing in ears. Nose: Denies sinus pain, congestion, infections, runny nose, and nosebleeds. Mouth/throat: Denies hoarseness, sore throat, bleeding gums, and difficulty swallowing. Heart: Denies chest pain, palpitations, racing heart, irregular heartbeat, leg pain or swelling,  Respiratory: Denies breathing difficulty, shortness of breath, wheezing, cough, and sputum. GI: Denies change in appetite, heartburn, nausea, vomiting, constipation,  GU: Denies difficulty urinating, pain with urinating, urgency, frequency, blood in urine. Musculoskeletal: Denies joint stiffness, pain, swelling, muscle weakness. Skin: Denies rash, itching, mass, tumors, sores, and boils Neurologic: Denies headache, fainting, dizziness, seizures, numbness, and tingling. Psychiatric: Denies depression, anxiety, difficulty sleeping, and memory loss. Endocrine: Denies heat or cold intolerance, and increased thirst or urination. Blood/lymph: Denies easy bruising, easy bruising, and swollen glands     Objective:     BP (!) 181/79 (BP Location: Left Arm)   Pulse 95   Temp 98.5 F (36.9 C) (Oral)   Resp 18   Ht 5\' 4"  (1.626 m)   Wt 73.5 kg   SpO2 99%   BMI 27.81 kg/m   Constitutional :  alert, cooperative, appears stated age and no distress  Lymphatics/Throat:  no asymmetry, masses, or scars  Respiratory:  clear to auscultation bilaterally  Cardiovascular:  regular rate and rhythm  Gastrointestinal: soft, non-tender; bowel sounds normal; no masses,  no organomegaly.   Musculoskeletal: Steady gait and movement  Skin: Cool and moist,  surgical scars   Psychiatric: Normal affect, non-agitated, not confused       LABS:  CMP Latest Ref Rng & Units 09/10/2018 09/09/2018 09/06/2018  Glucose 70 - 99 mg/dL 125(H) 131(H) 104(H)  BUN 8 - 23 mg/dL 24(H) 24(H) 20  Creatinine 0.44 - 1.00 mg/dL 0.97  1.01(H) 0.94  Sodium 135 - 145 mmol/L 142 142 144  Potassium 3.5 - 5.1 mmol/L 3.8 3.5 4.0  Chloride 98 - 111 mmol/L 109 108 115(H)  CO2 22 - 32 mmol/L 26 27 21(L)  Calcium 8.9 - 10.3 mg/dL 8.3(L) 8.3(L) 8.1(L)  Total Protein 6.5 - 8.1 g/dL - 5.5(L) -  Total Bilirubin 0.3 - 1.2 mg/dL - 0.7 -  Alkaline Phos 38 - 126 U/L - 33(L) -  AST 15 - 41 U/L - 19 -  ALT 0 - 44 U/L - 14 -   CBC Latest Ref Rng & Units 09/10/2018 09/10/2018 09/09/2018  WBC 4.0 - 10.5 K/uL - 11.0(H) 11.8(H)  Hemoglobin 12.0 - 15.0 g/dL 9.0(L) 9.4(L) 9.0(L)  Hematocrit 36.0 - 46.0 % 27.5(L) 29.1(L) 27.2(L)  Platelets 150 - 400 K/uL - 159 161    RADS: Pending bleeding scan Assessment:   Lower GI bleed, recurrent History of diverticulosis History of coronary artery disease, MI History of breast cancer  Plan:     Agree with bleeding scan first to pinpoint bleeding, and ideally endovascular intervention if feasbile. Will like to avoid colon resection as much as possible, since we may need to consider a subtotal colectomy on her because she has diverticulosis throughout her colon and her last colonoscopy noted old blood throughout colon.  The patient verbalized understanding and all questions were answered to the patient's  satisfaction at this time.  Per patient request, husband and son were also updated via phone and all questions and concerns addressed.  Continue n.p.o. for now until bleeding scan is completed, and possible interventions are discussed  Repeat Hgb count noted at 7.6.  Recommend treating as if actively bleeding and transfuse one unit, place her on continuous monitoring.

## 2018-09-10 NOTE — Care Management Obs Status (Signed)
[place] NOTIFICATION   Patient Details  Name: [name] [name] MRN: [id number] Date of Birth: 1941-01-23   Medicare Observation Status Notification Given:  Yes    Clint Biello A Zach Tietje, RN 09/10/2018, 12:38 PM

## 2018-09-10 NOTE — Progress Notes (Signed)
Patient's oral temp 99.0, received verbal order from Gardiner Barefoot NP for one time dose of benadryl 25mg  for pre-blood transfusion medication.

## 2018-09-10 NOTE — Progress Notes (Signed)
*  PRELIMINARY RESULTS* Echocardiogram 2D Echocardiogram has been performed.  [name] [name] 09/10/2018, 9:40 AM

## 2018-09-10 NOTE — ED Notes (Signed)
Admitting md at bedside

## 2018-09-10 NOTE — ED Provider Notes (Signed)
[facility] Emergency Department Provider Note  ____________________________________________  Time seen: Approximately 12:26 AM  I have reviewed the triage vital signs and the nursing notes.   HISTORY  Chief Complaint Near Syncope   HPI [name] [name] is a 78 y.o. female with history of diverticulosis and lower GI bleeding who presents for evaluation of syncope.  Patient was discharged from the hospital yesterday after being admitted for 4 days for lower GI bleed due to diverticular disease.  Patient received a blood transfusion in-house.  She reports that today she was watching TV when she had an episode of dizziness and passed out on the couch.  She did not sustain an injury as she was sitting on the couch when this happened.  She is not on blood thinners.  She reports having 2 episodes of rectal bleeding today.  Patient reports that her lower GI bleed had resolved for 2 to 3 days before restarting again today.  She denies abdominal pain, fever or chills.  Past Medical History:  Diagnosis Date  . Breast cancer ([place]) 2018   Right breast  . 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 Mucin noted on biopsy, not on wide excision.  . Coronary artery disease   . GERD (gastroesophageal reflux disease)   . Hyperlipidemia   . Hypertension   . Lichen planus 48/18/5631   Right breast in field of whole breast radiation. Dr. Kellie Moor DX by punch bioppsy  . Lichen planus    right breast   . Lichen planus   . MI (myocardial infarction) ([place])    2015  . Personal history of radiation therapy 09/2016   RIGHT lumpectomy w/ radiation    Patient Active Problem List   Diagnosis Date Noted  . Hiatal hernia   . Melena   . Polyp of ascending colon   . Diverticulosis of large intestine without diverticulitis   . GIB (gastrointestinal bleeding) 09/05/2018  . Chest pain 03/31/2018  . Lichen planus 49/70/2637  .  Malignant neoplasm of upper-inner quadrant of right breast in female, estrogen receptor positive ([place]) 07/28/2016  . Gastric ulcer requiring drug therapy, chronic 01/23/2016  . MI (mitral incompetence) 07/22/2015  . Combined fat and carbohydrate induced hyperlipemia 12/26/2014  . UTI (lower urinary tract infection) 08/18/2014  . Blurry vision 08/18/2014  . Allergic rhinitis 07/12/2014  . Absolute anemia 07/12/2014  . Baker's cyst of knee 07/12/2014  . Atherosclerosis of coronary artery 07/12/2014  . CAFL (chronic airflow limitation) ([place]) 07/12/2014  . Dizziness 07/12/2014  . Essential (primary) hypertension 07/12/2014  . Acid reflux 07/12/2014  . 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 History of colon polyps 07/12/2014  . Hypercholesteremia 07/12/2014  . Malaise and fatigue 07/12/2014  . Heart attack ([place]) 07/12/2014  . Muscle ache 07/12/2014  . Arthritis, degenerative 07/12/2014  . Bradycardia 08/23/2013  . Arteriosclerosis of coronary artery 08/17/2013  . Diabetes ([place]) 08/17/2013  . Diabetes mellitus ([place]) 08/17/2013  . Peripheral vascular disease ([place]) 08/17/2013    Past Surgical History:  Procedure Laterality Date  . ABDOMINAL HYSTERECTOMY    . APPENDECTOMY    . BREAST BIOPSY Right 07/23/2016   INVASIVE MAMMARY CARCINOMA WITH AREAS OF EXTRACELLULAR MUCIN  . BREAST EXCISIONAL BIOPSY     INVASIVE MUCINOUS MAMMARY CARCINOMA.   Marland Kitchen BREAST LUMPECTOMY Right 08/20/2016   INVASIVE MUCINOUS MAMMARY CARCINOMA. /Grade 2   . COLONOSCOPY  2016  . COLONOSCOPY N/A 09/06/2018   Procedure: COLONOSCOPY;  Surgeon: Virgel Manifold, MD;  Location: [facility] ENDOSCOPY;  Service: Endoscopy;  Laterality: N/A;  . CORONARY ANGIOPLASTY WITH STENT PLACEMENT    . CYSTOSCOPY    . ESOPHAGOGASTRODUODENOSCOPY N/A 09/06/2018   Procedure: ESOPHAGOGASTRODUODENOSCOPY (EGD);  Surgeon: Virgel Manifold, MD;  Location: [facility] ENDOSCOPY;  Service: Endoscopy;  Laterality: N/A;  . OOPHORECTOMY    . PARTIAL  MASTECTOMY WITH AXILLARY SENTINEL LYMPH NODE BIOPSY Right 08/09/2016   Procedure: PARTIAL MASTECTOMY WITH AXILLARY SENTINEL LYMPH NODE BIOPSY;  Surgeon: Robert Bellow, MD;  Location: [facility] ORS;  Service: General;  Laterality: Right;  . TONSILLECTOMY      Prior to Admission medications   Medication Sig Start Date End Date Taking? Authorizing Provider  Clobetasol Prop Emollient Base 0.05 % emollient cream as needed.  05/03/17   [provider]  letrozole (FEMARA) 2.5 MG tablet Take 2.5 mg by mouth daily. 06/14/18   [provider]  lisinopril (ZESTRIL) 10 MG tablet Take 1 tablet (10 mg total) by mouth daily. 08/24/18   Jerrol Banana., MD  nystatin cream (MYCOSTATIN) Apply 1 application topically 2 (two) times daily. 08/30/17   Jerrol Banana., MD  omeprazole (PRILOSEC) 20 MG capsule Take 1 capsule (20 mg total) by mouth daily as needed (indigestion). 04/06/18   Jerrol Banana., MD  pravastatin (PRAVACHOL) 10 MG tablet Take 10 mg by mouth daily.    [provider]    Allergies Celebrex  [celecoxib], Penicillins, and Prevacid [lansoprazole]  Family History  Problem Relation Age of Onset  . Hyperlipidemia Mother   . Allergies Mother   . Cerebral aneurysm Mother        cause of death at age 91  . Heart disease Father        Fatal MI ag 66  . Cancer Brother        lung cancer  . Hyperlipidemia Brother   . Colonic polyp Brother   . Healthy Son   . Cancer - Lung Brother        colon  . Healthy Son   . Breast cancer Neg Hx     Social History Social History   Tobacco Use  . Smoking status: Former Smoker    Packs/day: 0.25    Years: 10.00    Pack years: 2.50    Types: Cigarettes    Quit date: 03/06/1998    Years since quitting: 20.5  . Smokeless tobacco: Never Used  Substance Use Topics  . Alcohol use: No  . Drug use: No    Review of Systems  Constitutional: Negative for fever. + syncope Eyes: Negative for visual changes. ENT:  Negative for sore throat. Neck: No neck pain  Cardiovascular: Negative for chest pain. Respiratory: Negative for shortness of breath. Gastrointestinal: Negative for abdominal pain, vomiting or diarrhea. + rectal bleeding Genitourinary: Negative for dysuria. Musculoskeletal: Negative for back pain. Skin: Negative for rash. Neurological: Negative for headaches, weakness or numbness. Psych: No SI or HI  ____________________________________________   PHYSICAL EXAM:  VITAL SIGNS: ED Triage Vitals  Enc Vitals Group     BP 09/09/18 2354 (!) 188/73     Pulse Rate 09/09/18 2354 88     Resp 09/09/18 2354 20     Temp 09/09/18 2354 98.5 F (36.9 C)     Temp src --      SpO2 09/09/18 2354 100 %     Weight 09/09/18 2321 162 lb (73.5 kg)     Height 09/09/18 2321  5\' 4"  (1.626 m)     Head Circumference --      Peak Flow --      Pain Score 09/09/18 2319 5     Pain Loc --      Pain Edu? --      Excl. in [place]? --     Constitutional: Alert and oriented. Well appearing and in no apparent distress. HEENT:      Head: Normocephalic and atraumatic.         Eyes: Conjunctivae are normal. Sclera is non-icteric.       Mouth/Throat: Mucous membranes are moist.       Neck: Supple with no signs of meningismus. Cardiovascular: Regular rate and rhythm. No murmurs, gallops, or rubs. 2+ symmetrical distal pulses are present in all extremities. No JVD. Respiratory: Normal respiratory effort. Lungs are clear to auscultation bilaterally. No wheezes, crackles, or rhonchi.  Gastrointestinal: Soft, non tender, and non distended with positive bowel sounds. No rebound or guarding. Genitourinary: No CVA tenderness.  Rectal exam showing red blood in the rectal vault Musculoskeletal: Nontender with normal range of motion in all extremities. No edema, cyanosis, or erythema of extremities. Neurologic: Normal speech and language. Face is symmetric. Moving all extremities. No gross focal neurologic deficits are  appreciated. Skin: Skin is warm, dry and intact. No rash noted. Psychiatric: Mood and affect are normal. Speech and behavior are normal.  ____________________________________________   LABS (all labs ordered are listed, but only abnormal results are displayed)  Labs Reviewed  CBC WITH DIFFERENTIAL/PLATELET - Abnormal; Notable for the following components:      Result Value   WBC 11.8 (*)    RBC 2.91 (*)    Hemoglobin 9.0 (*)    HCT 27.2 (*)    RDW 15.7 (*)    Neutro Abs 9.2 (*)    Abs Immature Granulocytes 0.08 (*)    All other components within normal limits  COMPREHENSIVE METABOLIC PANEL - Abnormal; Notable for the following components:   Glucose, Bld 131 (*)    BUN 24 (*)    Creatinine, Ser 1.01 (*)    Calcium 8.3 (*)    Total Protein 5.5 (*)    Albumin 3.2 (*)    Alkaline Phosphatase 33 (*)    GFR calc non Af Amer 53 (*)    All other components within normal limits  URINALYSIS, COMPLETE (UACMP) WITH MICROSCOPIC - Abnormal; Notable for the following components:   Color, Urine YELLOW (*)    APPearance HAZY (*)    Hgb urine dipstick LARGE (*)    Leukocytes,Ua TRACE (*)    All other components within normal limits  NOVEL CORONAVIRUS, NAA (HOSPITAL ORDER, SEND-OUT TO REF LAB)  SAMPLE TO BLOOD BANK  TYPE AND SCREEN   ____________________________________________  EKG  ED ECG REPORT I, Rudene Re, the attending physician, personally viewed and interpreted this ECG.  Normal sinus rhythm, rate of 87, normal intervals, normal axis, no ST elevations or depressions.  Normal EKG. ____________________________________________  RADIOLOGY  none  ____________________________________________   PROCEDURES  Procedure(s) performed: None Procedures Critical Care performed:  None ____________________________________________   INITIAL IMPRESSION / ASSESSMENT AND PLAN / ED COURSE  78 y.o. female with history of diverticulosis and lower GI bleeding who presents for  evaluation of syncope.  Patient discharged from the hospital yesterday after being admitted for 4 days for acute blood loss anemia in the setting of diverticular bleed requiring blood transfusion.  Lower GI bleed has recurred today with 2 large  episodes.  Patient had a syncopal event at home.  She is currently hemodynamically stable. Her hgb has dropped almost 1 point when compared to yesterday's labs. She is not anticoagulated. Type and cross active. No indicated for transfusion at this time. Will admit to the Hospitalist for monitoring.       As part of my medical decision making, I reviewed the following data within the [place] notes reviewed and incorporated, Labs reviewed , Old chart reviewed, Discussed with admitting physician , Notes from prior ED visits and [place] Controlled Substance Database    Pertinent labs & imaging results that were available during my care of the patient were reviewed by me and considered in my medical decision making (see chart for details).    ____________________________________________   FINAL CLINICAL IMPRESSION(S) / ED DIAGNOSES  Final diagnoses:  Syncope, unspecified syncope type  Lower GI bleed      NEW MEDICATIONS STARTED DURING THIS VISIT:  ED Discharge Orders    None       Note:  This document was prepared using Dragon voice recognition software and may include unintentional dictation errors.    Alfred Levins, [place], MD 09/10/18 [phone]

## 2018-09-10 NOTE — H&P (Addendum)
[facility] NAME: [name] [name]    MR#:  [id number]  DATE OF BIRTH:  Dec 19, 1940  DATE OF ADMISSION: 09/10/2018  PRIMARY CARE PHYSICIAN: Jerrol Banana., MD   REQUESTING/REFERRING PHYSICIAN: Gonzella Lex, MD CHIEF COMPLAINT:   Chief Complaint  Patient presents with  . Near Syncope    HISTORY OF PRESENT ILLNESS:  [name] [name]  is a 78 y.o. African-American female with a known history of multiple medical problems that will be mentioned below including recently diagnosed diverticulosis with lower GI bleeding for which she was admitted here few days ago and discharged on 09/08/2018.  She underwent EGD that was unremarkable and a colonoscopy showed polyps that were excised and diverticulosis.  Her hemoglobin dropped to 7.2 and she was transfused a total of 2 units of packed red blood cells during her hospital stay.  She had acute kidney injury that was managed with IV fluids.  Last night she was watching TV with her husband and apparently had a syncopal episode after having 2 bright red bleeding per rectum episodes.  She denied any melena or nausea or vomiting or heartburn.  No other bleeding diathesis.  She denied any chest pain or dyspnea or palpitations headache or dizziness or paresthesias or focal muscle weakness.  Her husband called EMS.  She denied any fever or chills.  No cough or wheezing.  No recent sick exposures.  Upon presentation to the emergency room, her blood pressure was elevated 188/73 with otherwise normal vital signs.  Labs revealed borderline potassium of 3.5 and a BUN of 24, albumin at 3.2 and total protein 5.5 with a WBC of 11.8, hemoglobin of 9 and hematocrit 27.2 with a previous hemoglobin of 9.9 on 09/08/2018.  Platelets were 161.  UA was unremarkable.  EKG showed normal sinus rhythm with rate of 87 with no acute findings.  The patient was typed and crossmatched.  She will be admitted to an observation  medical monitored bed for further evaluation and management. PAST MEDICAL HISTORY:   Past Medical History:  Diagnosis Date  . Breast cancer ([place]) 2018   Right breast  . 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 Mucin noted on biopsy, not on wide excision.  . Coronary artery disease   . GERD (gastroesophageal reflux disease)   . Hyperlipidemia   . Hypertension   . Lichen planus 85/04/7739   Right breast in field of whole breast radiation. Dr. Kellie Moor DX by punch bioppsy  . Lichen planus    right breast   . Lichen planus   . MI (myocardial infarction) ([place])    2015  . Personal history of radiation therapy 09/2016   RIGHT lumpectomy w/ radiation    PAST SURGICAL HISTORY:   Past Surgical History:  Procedure Laterality Date  . ABDOMINAL HYSTERECTOMY    . APPENDECTOMY    . BREAST BIOPSY Right 07/23/2016   INVASIVE MAMMARY CARCINOMA WITH AREAS OF EXTRACELLULAR MUCIN  . BREAST EXCISIONAL BIOPSY     INVASIVE MUCINOUS MAMMARY CARCINOMA.   Marland Kitchen BREAST LUMPECTOMY Right 08/20/2016   INVASIVE MUCINOUS MAMMARY CARCINOMA. /Grade 2   . COLONOSCOPY  2016  . COLONOSCOPY N/A 09/06/2018   Procedure: COLONOSCOPY;  Surgeon: Virgel Manifold, MD;  Location: [facility] ENDOSCOPY;  Service: Endoscopy;  Laterality: N/A;  . CORONARY ANGIOPLASTY WITH STENT PLACEMENT    . CYSTOSCOPY    . ESOPHAGOGASTRODUODENOSCOPY N/A 09/06/2018   Procedure:  ESOPHAGOGASTRODUODENOSCOPY (EGD);  Surgeon: Virgel Manifold, MD;  Location[facility] ENDOSCOPY;  Service: Endoscopy;  Laterality: N/A;  . OOPHORECTOMY    . PARTIAL MASTECTOMY WITH AXILLARY SENTINEL LYMPH NODE BIOPSY Right 08/09/2016   Procedure: PARTIAL MASTECTOMY WITH AXILLARY SENTINEL LYMPH NODE BIOPSY;  Surgeon: Robert Bellow, MD;  Location: [facility] ORS;  Service: General;  Laterality: Right;  . TONSILLECTOMY      SOCIAL HISTORY:   Social History   Tobacco Use  . Smoking status: Former Smoker    Packs/day:  0.25    Years: 10.00    Pack years: 2.50    Types: Cigarettes    Quit date: 03/06/1998    Years since quitting: 20.5  . Smokeless tobacco: Never Used  Substance Use Topics  . Alcohol use: No    FAMILY HISTORY:   Family History  Problem Relation Age of Onset  . Hyperlipidemia Mother   . Allergies Mother   . Cerebral aneurysm Mother        cause of death at age 60  . Heart disease Father        Fatal MI ag 51  . Cancer Brother        lung cancer  . Hyperlipidemia Brother   . Colonic polyp Brother   . Healthy Son   . Cancer - Lung Brother        colon  . Healthy Son   . Breast cancer Neg Hx     DRUG ALLERGIES:   Allergies  Allergen Reactions  . Celebrex  [Celecoxib]     Dark stools.  . Penicillins Other (See Comments)    Childhood reaction Has patient had a PCN reaction causing immediate rash, facial/tongue/throat swelling, SOB or lightheadedness with hypotension: Unknown Has patient had a PCN reaction causing severe rash involving mucus membranes or skin necrosis: Unknown Has patient had a PCN reaction that required hospitalization: Unknown Has patient had a PCN reaction occurring within the last 10 years: Unknown If all of the above answers are "NO", then may proceed with Cephalosporin use.   Marland Kitchen Prevacid [Lansoprazole] Diarrhea    REVIEW OF SYSTEMS:   ROS As per history of present illness. All pertinent systems were reviewed above. Constitutional,  HEENT, cardiovascular, respiratory, GI, GU, musculoskeletal, neuro, psychiatric, endocrine,  integumentary and hematologic systems were reviewed and are otherwise  negative/unremarkable except for positive findings mentioned above in the HPI.   MEDICATIONS AT HOME:   Prior to Admission medications   Medication Sig Start Date End Date Taking? Authorizing Provider  letrozole (FEMARA) 2.5 MG tablet Take 2.5 mg by mouth daily. 06/14/18  Yes [provider]  lisinopril (ZESTRIL) 10 MG tablet Take 1 tablet (10  mg total) by mouth daily. 08/24/18  Yes Jerrol Banana., MD  nystatin cream (MYCOSTATIN) Apply 1 application topically 2 (two) times daily. 08/30/17  Yes Jerrol Banana., MD  pravastatin (PRAVACHOL) 10 MG tablet Take 10 mg by mouth daily.   Yes [provider]  Clobetasol Prop Emollient Base 0.05 % emollient cream as needed.  05/03/17   [provider]  omeprazole (PRILOSEC) 20 MG capsule Take 1 capsule (20 mg total) by mouth daily as needed (indigestion). 04/06/18   Jerrol Banana., MD      VITAL SIGNS:  Blood pressure (!) 188/73, pulse 88, temperature 98.5 F (36.9 C), resp. rate 20, height 5\' 4"  (1.626 m), weight 73.5 kg, SpO2 100 %.  PHYSICAL EXAMINATION:  Physical Exam  GENERAL:  78 y.o.-year-old African-American female patient lying in the bed with no acute distress.  EYES: Pupils equal, round, reactive to light and accommodation. No scleral icterus. Extraocular muscles intact.  HEENT: Head atraumatic, normocephalic. Oropharynx and nasopharynx clear.  NECK:  Supple, no jugular venous distention. No thyroid enlargement, no tenderness.  LUNGS: Normal breath sounds bilaterally, no wheezing, rales,rhonchi or crepitation. No use of accessory muscles of respiration.  CARDIOVASCULAR: Regular rate and rhythm, S1, S2 normal. No murmurs, rubs, or gallops.  ABDOMEN: Soft, nondistended, nontender. Bowel sounds present. No organomegaly or mass.  EXTREMITIES: No pedal edema, cyanosis, or clubbing.  NEUROLOGIC: Cranial nerves II through XII are intact. Muscle strength 5/5 in all extremities. Sensation intact. Gait not checked.  PSYCHIATRIC: The patient is alert and oriented x 3.  Normal affect and good eye contact. SKIN: No obvious rash, lesion, or ulcer.   LABORATORY PANEL:   CBC Recent Labs  Lab 09/09/18 2333  WBC 11.8*  HGB 9.0*  HCT 27.2*  PLT 161    ------------------------------------------------------------------------------------------------------------------  Chemistries  Recent Labs  Lab 09/09/18 2333  NA 142  K 3.5  CL 108  CO2 27  GLUCOSE 131*  BUN 24*  CREATININE 1.01*  CALCIUM 8.3*  AST 19  ALT 14  ALKPHOS 33*  BILITOT 0.7   ------------------------------------------------------------------------------------------------------------------  Cardiac Enzymes No results for input(s): TROPONINI in the last 168 hours. ------------------------------------------------------------------------------------------------------------------  RADIOLOGY:  No results found.    IMPRESSION AND PLAN:   1.  GI bleeding likely of lower GI etiology, most probably recurrent from diverticulosis with subsequent acute blood loss anemia, mildly worse.  The patient will be admitted to an observation medical monitored bed.  Will follow serial hemoglobin and hematocrits.  At this time we do not believe she needs to be transfused.  We will put her prophylactically on IV Protonix.  A GI consultation will be obtained by Dr. Allen Norris.  2.  Syncope.  This is likely neurally mediated due to #1.  She will be monitored for arrhythmias.  Will obtain orthostatics every 12 hours as well as a 2D echo and bilateral carotid Doppler for complete evaluation.  3.  Hypertension.  We will continue her lisinopril.  4.  Dyslipidemia.  Statin therapy will be resumed.  5.  History of right breast cancer.  She will be continued on her letrozole.  6.  Coronary artery disease, status post PCI and stent.  Her aspirin has been held off since last admission.  7.  DVT prophylaxis.  SCDs.  Medical prophylaxis currently contraindicated due to her GI bleeding.  8.  GI prophylaxis.  This was addressed above with IV Protonix.  All the records are reviewed and case discussed with ED provider. The plan of care was discussed in details with the patient (and family). I answered  all questions. The patient agreed to proceed with the above mentioned plan. Further management will depend upon hospital course.   CODE STATUS: Full code  TOTAL TIME TAKING CARE OF THIS PATIENT: 50 minutes.    Christel Mormon M.D on 09/10/2018 at 12:56 AM  Pager - [phone]  After 6pm go to [email or web address] - password [email or web address] [facility]  [employer] [facility]  Office  [phone]  CC: Primary care physician; Jerrol Banana., MD   Note: This dictation was prepared with Dragon dictation along with smaller phrase technology. Any transcriptional errors that result from this process are unintentional.

## 2018-09-10 NOTE — Progress Notes (Addendum)
The patient has 2 episodes of small bloody stool and one episode large bloody stool. The patient feels sick, weak and dizzy.  She is drowsy and lethargic. Vitals are reviewed.  Physical examinations done. GI bleeding.  Follow-up hemoglobin, PRBC transfusion PRN.  Follow-up GI consult. Per Dr. [place] Norris, surgery and vascular surgery consult.  Per Dr. Radene Gunning, GI bleeding scan and he will discuss with general surgeon. I discussed with the patient and RN.  Discussed with Dr. Allen Norris. Time spent about 36 minutes.

## 2018-09-10 NOTE — Consult Note (Signed)
V[facility] and [facility]in Specialist of [facility]  Patient name: [name] [name] MRN: [id number] DOB: [birth year] Sex: female   REQUESTING PROVIDER:    Dr. Bridgett Larsson   REASON FOR CONSULT:    GI bleed and diverticulosis  HISTORY OF PRESENT ILLNESS:   [name] [name] is a 78 y.o. female, who presented to the emergency department having had a near syncopal event and to episodes of bright red blood in her stool.  She is not currently having any dizziness or lightheadedness.  She was hypertensive in the emergency department with a blood pressure of 188/73.  The patient was recently discharged following a similar episode.  At that time she had undergone upper and lower endoscopy.  Colonoscopy showed diverticulosis as well as some polyps which were excised.  During that hospitalization she was transfused 2 units of blood.  Patient has a history of coronary artery disease.  She takes a statin for hypercholesterolemia.  Her blood pressure is medically managed.  She is a former smoker.  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 Mucin noted on biopsy, not on wide excision.   Coronary artery disease    GERD (gastroesophageal reflux disease)    Hyperlipidemia    Hypertension    Lichen planus 26/33/3545   Right breast in field of whole breast radiation. Dr. Kellie Moor DX by punch bioppsy   Lichen planus    right breast    Lichen planus    MI (myocardial infarction) [facility])    2015   Personal history of radiation therapy 09/2016   RIGHT lumpectomy w/ radiation     FAMILY HISTORY   Family History  Problem Relation Age of Onset   Hyperlipidemia Mother    Allergies Mother    Cerebral aneurysm Mother        cause of death at age 45   Heart disease Father        Fatal MI ag 59   Cancer  Brother        lung cancer   Hyperlipidemia Brother    Colonic polyp Brother    Healthy Son    Cancer - Lung Brother        colon   Healthy Son    Breast cancer Neg Hx     SOCIAL HISTORY:   Social History   Socioeconomic History   Marital status: Married    Spouse name: [name]   Number of children: 2   Years of education: Not on file   Highest education level: High school graduate  Occupational History   Occupation: retired  Scientist, product/process development strain: Not hard at all   Food insecurity    Worry: Never true    Inability: Never true   Transportation needs    Medical: No    Non-medical: No  Tobacco Use   Smoking status: Former Smoker    Packs/day: 0.25    Years: 10.00    Pack years: 2.50    Types: Cigarettes    Quit date: 03/06/1998    Years since quitting: 20.5   Smokeless tobacco: Never Used  Substance and Sexual Activity   Alcohol use: No   Drug use: No   Sexual activity: Yes    Birth control/protection: Surgical  Lifestyle   Physical activity    Days per  week: 0 days    Minutes per session: 0 min   Stress: Not at all  Relationships   Social connections    Talks on phone: Patient refused    Gets together: Patient refused    Attends religious service: Patient refused    Active member of club or organization: Patient refused    Attends meetings of clubs or organizations: Patient refused    Relationship status: Patient refused   Intimate partner violence    Fear of current or ex partner: Patient refused    Emotionally abused: Patient refused    Physically abused: Patient refused    Forced sexual activity: Patient refused  Other Topics Concern   Not on file  Social History Narrative   Not on file    ALLERGIES:    Allergies  Allergen Reactions   Celebrex  [Celecoxib]     Dark stools.   Penicillins Other (See Comments)    Childhood reaction Has patient had a PCN reaction causing immediate rash,  facial/tongue/throat swelling, SOB or lightheadedness with hypotension: Unknown Has patient had a PCN reaction causing severe rash involving mucus membranes or skin necrosis: Unknown Has patient had a PCN reaction that required hospitalization: Unknown Has patient had a PCN reaction occurring within the last 10 years: Unknown If all of the above answers are "NO", then may proceed with Cephalosporin use.    Prevacid [Lansoprazole] Diarrhea    CURRENT MEDICATIONS:    Current Facility-Administered Medications  Medication Dose Route Frequency Provider Last Rate Last Dose   0.9 %  sodium chloride infusion   Intravenous Continuous Mansy, Jan A, MD 100 mL/hr at 09/10/18 1052     acetaminophen (TYLENOL) tablet 650 mg  650 mg Oral Q6H PRN Mansy, Jan A, MD       Or   acetaminophen (TYLENOL) suppository 650 mg  650 mg Rectal Q6H PRN Mansy, Jan A, MD       letrozole [facility]) tablet 2.5 mg  2.5 mg Oral Daily Mansy, Jan A, MD   2.5 mg at 09/10/18 0955   lisinopril (ZESTRIL) tablet 10 mg  10 mg Oral Daily Mansy, Jan A, MD   10 mg at 09/10/18 0955   ondansetron (ZOFRAN) tablet 4 mg  4 mg Oral Q6H PRN Mansy, Jan A, MD       Or   ondansetron [facility]) injection 4 mg  4 mg Intravenous Q6H PRN Mansy, Jan A, MD   4 mg at 09/10/18 1134   pantoprazole (PROTONIX) injection 40 mg  40 mg Intravenous Q12H Mansy, Jan A, MD   40 mg at 09/10/18 0955   pravastatin (PRAVACHOL) tablet 10 mg  10 mg Oral Daily Mansy, Jan A, MD       traZODone (DESYREL) tablet 25 mg  25 mg Oral QHS PRN Mansy, Arvella Merles, MD        REVIEW OF SYSTEMS:   [X]  denotes positive finding, [ ]  denotes negative finding Cardiac  Comments:  Chest pain or chest pressure:    Shortness of breath upon exertion:    Short of breath when lying flat:    Irregular heart rhythm:        Vascular    Pain in calf, thigh, or hip brought on by ambulation:    Pain in feet at night that wakes you up from your sleep:     Blood clot in your veins:      Leg swelling:         Pulmonary  Oxygen at home:    Productive cough:     Wheezing:         Neurologic    Sudden weakness in arms or legs:     Sudden numbness in arms or legs:     Sudden onset of difficulty speaking or slurred speech:    Temporary loss of vision in one eye:     Problems with dizziness:         Gastrointestinal    Blood in stool:  x    Vomited blood:         Genitourinary    Burning when urinating:     Blood in urine:        Psychiatric    Major depression:         Hematologic    Bleeding problems:    Problems with blood clotting too easily:        Skin    Rashes or ulcers:        Constitutional    Fever or chills:     PHYSICAL EXAM:   Vitals:   09/10/18 0156 09/10/18 0524 09/10/18 1047 09/10/18 1149  BP: (!) 147/73 138/62 (!) 156/49 (!) 181/79  Pulse: 90 75 87 95  Resp:   18   Temp: 98.3 F (36.8 C) 98.5 F (36.9 C)    TempSrc: Oral Oral    SpO2: 100% 98%  99%  Weight:      Height:        GENERAL: The patient is a well-nourished female, in no acute distress. The vital signs are documented above. CARDIAC: There is a regular rate and rhythm.  PULMONARY: Nonlabored respirations ABDOMEN: Soft and non-tender  MUSCULOSKELETAL: There are no major deformities or cyanosis. NEUROLOGIC: No focal weakness or paresthesias are detected. SKIN: There are no ulcers or rashes noted. PSYCHIATRIC: The patient has a normal affect.  STUDIES:   Bleeding scan pending  ASSESSMENT and PLAN   Recurrent lower GI bleed: The patient needs to be monitored in a stepdown or ICU bed.  She is anemic and would benefit from transfusion.  Bleeding scan has been ordered and the patient is currently getting her study.  Further recommendations will be based upon the results of her bleeding scan.  General surgery has also been consulted.   Leia Alf, MD, FACS [facility] Specialists of [facility] [phone] Pager [phone]

## 2018-09-10 NOTE — Progress Notes (Addendum)
Dr Bridgett Larsson previously informed that the GI Consult had not been performed to date; Dr Allen Norris, GI MD on call; sent secure chat to Dr Allen Norris, who advised that the pt was not on his rounds for consults today; Dr Bridgett Larsson brought in on the secure chat by Dr Allen Norris, decision made for Surgery Consult due to pt's history and current bloody stools, next timed Hgb at 1252; Dr Bridgett Larsson added on call surgeon to list for consults

## 2018-09-10 NOTE — Progress Notes (Signed)
Patient ID: [name] [name], female   DOB: 01-15-1941, 78 y.o.   MRN: [id number] Discussed with pt regarding getting 1 unit BT tonite. She understands risks and complications of BT Her hgb is 7.6 She is readmitted with GI bleed Pt has had transfusions in the past without any issues last week.

## 2018-09-11 DIAGNOSIS — Z79899 Other long term (current) drug therapy: Secondary | ICD-10-CM | POA: Diagnosis not present

## 2018-09-11 DIAGNOSIS — Z90710 Acquired absence of both cervix and uterus: Secondary | ICD-10-CM | POA: Diagnosis not present

## 2018-09-11 DIAGNOSIS — I252 Old myocardial infarction: Secondary | ICD-10-CM | POA: Diagnosis not present

## 2018-09-11 DIAGNOSIS — Z1159 Encounter for screening for other viral diseases: Secondary | ICD-10-CM | POA: Diagnosis not present

## 2018-09-11 DIAGNOSIS — K922 Gastrointestinal hemorrhage, unspecified: Secondary | ICD-10-CM

## 2018-09-11 DIAGNOSIS — D62 Acute posthemorrhagic anemia: Secondary | ICD-10-CM | POA: Diagnosis present

## 2018-09-11 DIAGNOSIS — I10 Essential (primary) hypertension: Secondary | ICD-10-CM | POA: Diagnosis present

## 2018-09-11 DIAGNOSIS — E7800 Pure hypercholesterolemia, unspecified: Secondary | ICD-10-CM | POA: Diagnosis present

## 2018-09-11 DIAGNOSIS — K219 Gastro-esophageal reflux disease without esophagitis: Secondary | ICD-10-CM | POA: Diagnosis present

## 2018-09-11 DIAGNOSIS — I2510 Atherosclerotic heart disease of native coronary artery without angina pectoris: Secondary | ICD-10-CM | POA: Diagnosis present

## 2018-09-11 DIAGNOSIS — Z170 Estrogen receptor positive status [ER+]: Secondary | ICD-10-CM | POA: Diagnosis not present

## 2018-09-11 DIAGNOSIS — Z87891 Personal history of nicotine dependence: Secondary | ICD-10-CM | POA: Diagnosis not present

## 2018-09-11 DIAGNOSIS — Z79811 Long term (current) use of aromatase inhibitors: Secondary | ICD-10-CM | POA: Diagnosis not present

## 2018-09-11 DIAGNOSIS — Z923 Personal history of irradiation: Secondary | ICD-10-CM | POA: Diagnosis not present

## 2018-09-11 DIAGNOSIS — K5791 Diverticulosis of intestine, part unspecified, without perforation or abscess with bleeding: Secondary | ICD-10-CM | POA: Diagnosis present

## 2018-09-11 DIAGNOSIS — Z955 Presence of coronary angioplasty implant and graft: Secondary | ICD-10-CM | POA: Diagnosis not present

## 2018-09-11 DIAGNOSIS — E785 Hyperlipidemia, unspecified: Secondary | ICD-10-CM | POA: Diagnosis present

## 2018-09-11 DIAGNOSIS — Z853 Personal history of malignant neoplasm of breast: Secondary | ICD-10-CM | POA: Diagnosis not present

## 2018-09-11 LAB — BASIC METABOLIC PANEL
Anion gap: 6 (ref 5–15)
BUN: 14 mg/dL (ref 8–23)
CO2: 23 mmol/L (ref 22–32)
Calcium: 8 mg/dL — ABNORMAL LOW (ref 8.9–10.3)
Chloride: 114 mmol/L — ABNORMAL HIGH (ref 98–111)
Creatinine, Ser: 0.84 mg/dL (ref 0.44–1.00)
GFR calc Af Amer: >60 mL/min (ref >60)
GFR calc non Af Amer: >60 mL/min (ref >60)
Glucose, Bld: 94 mg/dL (ref 70–99)
Potassium: 3.9 mmol/L (ref 3.5–5.1)
Sodium: 143 mmol/L (ref 135–145)

## 2018-09-11 LAB — CBC
HCT: 26.3 % — ABNORMAL LOW (ref 36.0–46.0)
Hemoglobin: 8.4 g/dL — ABNORMAL LOW (ref 12.0–15.0)
MCH: 28.9 pg (ref 26.0–34.0)
MCHC: 31.9 g/dL (ref 30.0–36.0)
MCV: 90.4 fL (ref 80.0–100.0)
Platelets: 122 10*3/uL — ABNORMAL LOW (ref 150–400)
RBC: 2.91 MIL/uL — ABNORMAL LOW (ref 3.87–5.11)
RDW: 17.2 % — ABNORMAL HIGH (ref 11.5–15.5)
WBC: 6.6 10*3/uL (ref 4.0–10.5)
nRBC: 0 % (ref 0.0–0.2)

## 2018-09-11 LAB — SAMPLE TO BLOOD BANK

## 2018-09-11 LAB — ECHOCARDIOGRAM COMPLETE
Height: 64 in
Weight: 2592 oz

## 2018-09-11 LAB — SURGICAL PATHOLOGY

## 2018-09-11 NOTE — Consult Note (Signed)
[name] Lame, MD [facility]  [address], [place], [place] [address] Phone: [phone] Fax : [phone]  Consultation  Referring Provider:     Dr. Bridgett Larsson Primary Care Physician:  Jerrol Banana., MD Primary Gastroenterologist:  Dr. Vira Agar Reason for Consultation:     Hematochezia  Date of Admission:  09/09/2018 Date of Consultation:  09/11/2018         HPI:   [name] [name] is a 78 y.o. female who was admitted on June 30 for rectal bleeding and was seen by Dr. Bonna Gains.  The patient underwent an EGD and colonoscopy that admission and was found to have diverticulosis as the cause of her rectal bleeding.  The patient was discharged on July 3rd.  The patient was admitted yesterday after having near syncope and recurrent lower GI bleed.  She had been noted to have a drop in her hemoglobin to 7.2 and she was transferred 2 units of packed red blood cells in her previous admission.  The night before this admission the patient has been watching TV and apparently had a simple call episode with 2 episodes of bright red blood per rectum the patient was brought to the emergency room with normal vital signs except some hypertension and her hemoglobin was 9.  The patient then had a GI consult called the following day and I recommended a bleeding scan, vascular surgery and general surgery consult.  The patient has not had any abdominal pain associated with her rectal bleeding.  The patient did have a bleeding scan done yesterday that was negative for any active bleeding.  Past Medical History:  Diagnosis Date  . Breast cancer ([place]) 2018   Right breast  . 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 Mucin noted on biopsy, not on wide excision.  . Coronary artery disease   . GERD (gastroesophageal reflux disease)   . Hyperlipidemia   . Hypertension   . Lichen planus 50/27/7412   Right breast in field of whole breast radiation. Dr. Kellie Moor  DX by punch bioppsy  . Lichen planus    right breast   . Lichen planus   . MI (myocardial infarction) ([place])    2015  . Personal history of radiation therapy 09/2016   RIGHT lumpectomy w/ radiation    Past Surgical History:  Procedure Laterality Date  . ABDOMINAL HYSTERECTOMY    . APPENDECTOMY    . BREAST BIOPSY Right 07/23/2016   INVASIVE MAMMARY CARCINOMA WITH AREAS OF EXTRACELLULAR MUCIN  . BREAST EXCISIONAL BIOPSY     INVASIVE MUCINOUS MAMMARY CARCINOMA.   Marland Kitchen BREAST LUMPECTOMY Right 08/20/2016   INVASIVE MUCINOUS MAMMARY CARCINOMA. /Grade 2   . COLONOSCOPY  2016  . COLONOSCOPY N/A 09/06/2018   Procedure: COLONOSCOPY;  Surgeon: Virgel Manifold, MD;  Location: [facility] ENDOSCOPY;  Service: Endoscopy;  Laterality: N/A;  . CORONARY ANGIOPLASTY WITH STENT PLACEMENT    . CYSTOSCOPY    . ESOPHAGOGASTRODUODENOSCOPY N/A 09/06/2018   Procedure: ESOPHAGOGASTRODUODENOSCOPY (EGD);  Surgeon: Virgel Manifold, MD;  [facility] ENDOSCOPY;  Service: Endoscopy;  Laterality: N/A;  . OOPHORECTOMY    . PARTIAL MASTECTOMY WITH AXILLARY SENTINEL LYMPH NODE BIOPSY Right 08/09/2016   Procedure: PARTIAL MASTECTOMY WITH AXILLARY SENTINEL LYMPH NODE BIOPSY;  Surgeon: Robert Bellow, MD;  Location: [facility] ORS;  Service: General;  Laterality: Right;  . TONSILLECTOMY      Prior to Admission medications   Medication Sig Start Date End Date  Taking? Authorizing Provider  letrozole (FEMARA) 2.5 MG tablet Take 2.5 mg by mouth daily. 06/14/18  Yes [provider]  lisinopril (ZESTRIL) 10 MG tablet Take 1 tablet (10 mg total) by mouth daily. 08/24/18  Yes Jerrol Banana., MD  nystatin cream (MYCOSTATIN) Apply 1 application topically 2 (two) times daily. 08/30/17  Yes Jerrol Banana., MD  pravastatin (PRAVACHOL) 10 MG tablet Take 10 mg by mouth daily.   Yes [provider]  Clobetasol Prop Emollient Base 0.05 % emollient cream as needed.  05/03/17   [provider]   omeprazole (PRILOSEC) 20 MG capsule Take 1 capsule (20 mg total) by mouth daily as needed (indigestion). 04/06/18   Jerrol Banana., MD    Family History  Problem Relation Age of Onset  . Hyperlipidemia Mother   . Allergies Mother   . Cerebral aneurysm Mother        cause of death at age 15  . Heart disease Father        Fatal MI ag 53  . Cancer Brother        lung cancer  . Hyperlipidemia Brother   . Colonic polyp Brother   . Healthy Son   . Cancer - Lung Brother        colon  . Healthy Son   . Breast cancer Neg Hx      Social History   Tobacco Use  . Smoking status: Former Smoker    Packs/day: 0.25    Years: 10.00    Pack years: 2.50    Types: Cigarettes    Quit date: 03/06/1998    Years since quitting: 20.5  . Smokeless tobacco: Never Used  Substance Use Topics  . Alcohol use: No  . Drug use: No    Allergies as of 09/09/2018 - Review Complete 09/09/2018  Allergen Reaction Noted  . Celebrex  [celecoxib]  07/12/2014  . Penicillins Other (See Comments) 07/12/2014  . Prevacid [lansoprazole] Diarrhea 07/12/2014    Review of Systems:    All systems reviewed and negative except where noted in HPI.   Physical Exam:  Vital signs in last 24 hours: Temp:  [97.8 F (36.6 C)-99 F (37.2 C)] 98.5 F (36.9 C) (07/06 0627) Pulse Rate:  [76-95] 84 (07/06 0853) Resp:  [16-18] 18 (07/06 0627) BP: (109-181)/(44-99) 161/74 (07/06 0853) SpO2:  [95 %-100 %] 100 % (07/06 0627) Last BM Date: 09/10/18 General:   Pleasant, cooperative in NAD Head:  Normocephalic and atraumatic. Eyes:   No icterus.   Conjunctiva pink. PERRLA. Ears:  Normal auditory acuity. Neck:  Supple; no masses or thyroidomegaly Lungs: Respirations even and unlabored. Lungs clear to auscultation bilaterally.   No wheezes, crackles, or rhonchi.  Heart:  Regular rate and rhythm;  Without murmur, clicks, rubs or gallops Abdomen:  Soft, nondistended, nontender. Normal bowel sounds. No appreciable masses  or hepatomegaly.  No rebound or guarding.  Rectal:  Not performed. Msk:  Symmetrical without gross deformities.    Extremities:  Without edema, cyanosis or clubbing. Neurologic:  Alert and oriented x3;  grossly normal neurologically. Skin:  Intact without significant lesions or rashes. Cervical Nodes:  No significant cervical adenopathy. Psych:  Alert and cooperative. Normal affect.  LAB RESULTS: Recent Labs    09/09/18 2333 09/10/18 0214 09/10/18 0448 09/10/18 1349 09/11/18 0336  WBC 11.8* 11.0*  --   --  6.6  HGB 9.0* 9.4* 9.0* 7.6* 8.4*  HCT 27.2* 29.1* 27.5* 23.6* 26.3*  PLT  161 159  --   --  122*   BMET Recent Labs    09/09/18 2333 09/10/18 0214 09/11/18 0336  NA 142 142 143  K 3.5 3.8 3.9  CL 108 109 114*  CO2 27 26 23   GLUCOSE 131* 125* 94  BUN 24* 24* 14  CREATININE 1.01* 0.97 0.84  CALCIUM 8.3* 8.3* 8.0*   LFT Recent Labs    09/09/18 2333  PROT 5.5*  ALBUMIN 3.2*  AST 19  ALT 14  ALKPHOS 33*  BILITOT 0.7   PT/INR No results for input(s): LABPROT, INR in the last 72 hours.  STUDIES: Nm Gi Blood Loss  Result Date: 09/10/2018 CLINICAL DATA:  6 bloody bowel movements today. EXAM: NUCLEAR MEDICINE GASTROINTESTINAL BLEEDING SCAN TECHNIQUE: Sequential abdominal images were obtained following intravenous administration of Tc-25m labeled red blood cells. RADIOPHARMACEUTICALS:  21.2 mCi Tc-7m pertechnetate in-vitro labeled red cells. COMPARISON:  08/09/2018 FINDINGS: Imaging was performed over a 2 hour period. No findings suspicious for active GI bleed are identified. IMPRESSION: Negative nuclear medicine bleeding scan. Electronically Signed   By: Marijo Sanes M.D.   On: 09/10/2018 17:33   US Carotid Bilateral  Result Date: 09/10/2018 CLINICAL DATA:  Syncope, hypertension EXAM: BILATERAL CAROTID DUPLEX ULTRASOUND TECHNIQUE: Pearline Cables scale imaging, color Doppler and duplex ultrasound were performed of bilateral carotid and vertebral arteries in the neck.  COMPARISON:  None. FINDINGS: Criteria: Quantification of carotid stenosis is based on velocity parameters that correlate the residual internal carotid diameter with NASCET-based stenosis levels, using the diameter of the distal internal carotid lumen as the denominator for stenosis measurement. The following velocity measurements were obtained: RIGHT ICA: 130/21 cm/sec CCA: 02/54 cm/sec SYSTOLIC ICA/CCA RATIO:  1.3 ECA: 145 cm/sec LEFT ICA: 90/21 cm/sec CCA: 27/06 cm/sec SYSTOLIC ICA/CCA RATIO:  0.9 ECA: 71 cm/sec RIGHT CAROTID ARTERY: Minor echogenic shadowing plaque formation. No hemodynamically significant right ICA stenosis, velocity elevation, or turbulent flow. Degree of narrowing less than 50%. RIGHT VERTEBRAL ARTERY:  Antegrade LEFT CAROTID ARTERY: Similar scattered minor echogenic plaque formation. No hemodynamically significant left ICA stenosis, velocity elevation, or turbulent flow. LEFT VERTEBRAL ARTERY:  Antegrade IMPRESSION: Minor carotid atherosclerosis. No hemodynamically significant ICA stenosis. Degree of narrowing less than 50% bilaterally by ultrasound criteria. Patent antegrade vertebral flow bilaterally Electronically Signed   By: Jerilynn Mages.  Shick M.D.   On: 09/10/2018 09:25      Impression / Plan:   Assessment: Active Problems:   Lower GI bleeding   Oriah B Tollison is a 78 y.o. y/o female with recurrent diverticular bleeding with a recent EGD and colonoscopy.  The patient's hemoglobin this morning was 8.4.  Plan:  This patient has recurrent diverticular bleed with a negative bleeding scan yesterday.  I had a long conversation with the patient's son about the nature of diverticular bleeding and how it can recur.  He was also told that the bleeding scan was negative precluding any vascular surgery intervention at this time.  The patient has been told that there is no utility in repeating the EGD and colonoscopy at this time.  The patients should include intervention with vascular  surgery or general surgery.  I will sign off.  Please call if any further GI concerns or questions.  We would like to thank you for the opportunity to participate in the care of [place].    Thank you for involving me in the care of this patient.      LOS: 0 days   [name] Lame, MD  09/11/2018, 11:20 AM    Note: This dictation was prepared with Dragon dictation along with smaller phrase technology. Any transcriptional errors that result from this process are unintentional.

## 2018-09-11 NOTE — Progress Notes (Signed)
Patient ID: [name] [name], female   DOB: [birth year], 78 y.o.   MRN: [id number]  [facility] PROGRESS NOTE  AVIANNAH CASTORO [id number]:[id number] DOB: September 20, 1940 DOA: 09/09/2018 PCP: Jerrol Banana., MD  HPI/Subjective: Patient had a bowel movement this morning with a little blood in it.  She stated it had some dark clots and lighter looking blood.  Objective: Vitals:   09/11/18 0853 09/11/18 1433  BP: (!) 161/74 (!) 175/79  Pulse: 84 79  Resp:  18  Temp:  98 F (36.7 C)  SpO2:      Filed Weights   09/09/18 2321  Weight: 73.5 kg    ROS: Review of Systems  Constitutional: Negative for chills and fever.  Eyes: Negative for blurred vision.  Respiratory: Negative for cough and shortness of breath.   Cardiovascular: Negative for chest pain.  Gastrointestinal: Positive for blood in stool. Negative for abdominal pain, constipation, diarrhea, nausea and vomiting.  Genitourinary: Negative for dysuria.  Musculoskeletal: Negative for joint pain.  Neurological: Negative for dizziness and headaches.   Exam: Physical Exam  Constitutional: She is oriented to person, place, and time.  HENT:  Nose: No mucosal edema.  Mouth/Throat: No oropharyngeal exudate or posterior oropharyngeal edema.  Eyes: Pupils are equal, round, and reactive to light. Conjunctivae, EOM and lids are normal.  Neck: No JVD present. Carotid bruit is not present. No edema present. No thyroid mass and no thyromegaly present.  Cardiovascular: S1 normal and S2 normal. Exam reveals no gallop.  No murmur heard. Pulses:      Dorsalis pedis pulses are 2+ on the right side and 2+ on the left side.  Respiratory: No respiratory distress. She has no wheezes. She has no rhonchi. She has no rales.  GI: Soft. Bowel sounds are normal. There is no abdominal tenderness.  Musculoskeletal:     Right ankle: She exhibits no swelling.     Left ankle: She exhibits no swelling.  Lymphadenopathy:    She has no cervical  adenopathy.  Neurological: She is alert and oriented to person, place, and time. No cranial nerve deficit.  Skin: Skin is warm. No rash noted. Nails show no clubbing.  Psychiatric: She has a normal mood and affect.      Data Reviewed: Basic Metabolic Panel: Recent Labs  Lab 09/05/18 0154 09/06/18 0617 09/09/18 2333 09/10/18 0214 09/11/18 0336  NA 143 144 142 142 143  K 3.8 4.0 3.5 3.8 3.9  CL 113* 115* 108 109 114*  CO2 23 21* 27 26 23   GLUCOSE 183* 104* 131* 125* 94  BUN 32* 20 24* 24* 14  CREATININE 1.38* 0.94 1.01* 0.97 0.84  CALCIUM 8.0* 8.1* 8.3* 8.3* 8.0*   Liver Function Tests: Recent Labs  Lab 09/05/18 0154 09/09/18 2333  AST 14* 19  ALT 9 14  ALKPHOS 41 33*  BILITOT 0.5 0.7  PROT 5.2* 5.5*  ALBUMIN 2.9* 3.2*   CBC: Recent Labs  Lab 09/05/18 1444 09/06/18 0617  09/09/18 2333 09/10/18 0214 09/10/18 0448 09/10/18 1349 09/11/18 0336  WBC 14.8* 9.8  --  11.8* 11.0*  --   --  6.6  NEUTROABS  --   --   --  9.2*  --   --   --   --   HGB 8.9* 8.0*   < > 9.0* 9.4* 9.0* 7.6* 8.4*  HCT 27.7* 25.3*  --  27.2* 29.1* 27.5* 23.6* 26.3*  MCV 95.5 97.3  --  93.5 95.4  --   --  90.4  PLT 137* 124*  --  161 159  --   --  122*   < > = values in this interval not displayed.    CBG: Recent Labs  Lab 09/05/18 1410  GLUCAP 147*    Recent Results (from the past 240 hour(s))  SARS Coronavirus 2 (CEPHEID - Performed in [place] hospital lab), Hosp Order     Status: None   Collection Time: 09/05/18  1:53 AM   Specimen: Nasopharyngeal Swab  Result Value Ref Range Status   SARS Coronavirus 2 NEGATIVE NEGATIVE Final    Comment: (NOTE) If result is NEGATIVE SARS-CoV-2 target nucleic acids are NOT DETECTED. The SARS-CoV-2 RNA is generally detectable in upper and lower  respiratory specimens during the acute phase of infection. The lowest  concentration of SARS-CoV-2 viral copies this assay can detect is 250  copies / mL. A negative result does not preclude  SARS-CoV-2 infection  and should not be used as the sole basis for treatment or other  patient management decisions.  A negative result may occur with  improper specimen collection / handling, submission of specimen other  than nasopharyngeal swab, presence of viral mutation(s) within the  areas targeted by this assay, and inadequate number of viral copies  (<250 copies / mL). A negative result must be combined with clinical  observations, patient history, and epidemiological information. If result is POSITIVE SARS-CoV-2 target nucleic acids are DETECTED. The SARS-CoV-2 RNA is generally detectable in upper and lower  respiratory specimens dur ing the acute phase of infection.  Positive  results are indicative of active infection with SARS-CoV-2.  Clinical  correlation with patient history and other diagnostic information is  necessary to determine patient infection status.  Positive results do  not rule out bacterial infection or co-infection with other viruses. If result is PRESUMPTIVE POSTIVE SARS-CoV-2 nucleic acids MAY BE PRESENT.   A presumptive positive result was obtained on the submitted specimen  and confirmed on repeat testing.  While 2019 novel coronavirus  (SARS-CoV-2) nucleic acids may be present in the submitted sample  additional confirmatory testing may be necessary for epidemiological  and / or clinical management purposes  to differentiate between  SARS-CoV-2 and other Sarbecovirus currently known to infect humans.  If clinically indicated additional testing with an alternate test  methodology [phone]) is advised. The SARS-CoV-2 RNA is generally  detectable in upper and lower respiratory sp ecimens during the acute  phase of infection. The expected result is Negative. Fact Sheet for Patients:  StrictlyIdeas.no Fact Sheet for Healthcare Providers: BankingDealers.co.za This test is not yet approved or cleared by the  [place] FDA and has been authorized for detection and/or diagnosis of SARS-CoV-2 by FDA under an Emergency Use Authorization (EUA).  This EUA will remain in effect (meaning this test can be used) for the duration of the COVID-19 declaration under Section 564(b)(1) of the Act, 21 U.S.C. section 360bbb-3(b)(1), unless the authorization is terminated or revoked sooner. Performed at [facility], [address] [place], [place] [address]      Studies: Nm Gi Blood Loss  Result Date: 09/10/2018 CLINICAL DATA:  6 bloody bowel movements today. EXAM: NUCLEAR MEDICINE GASTROINTESTINAL BLEEDING SCAN TECHNIQUE: Sequential abdominal images were obtained following intravenous administration of Tc-79m labeled red blood cells. RADIOPHARMACEUTICALS:  21.2 mCi Tc-98m pertechnetate in-vitro labeled red cells. COMPARISON:  08/09/2018 FINDINGS: Imaging was performed over a 2 hour period. No findings suspicious for active GI bleed are identified. IMPRESSION: Negative nuclear medicine  bleeding scan. Electronically Signed   By: Marijo Sanes M.D.   On: 09/10/2018 17:33   US Carotid Bilateral  Result Date: 09/10/2018 CLINICAL DATA:  Syncope, hypertension EXAM: BILATERAL CAROTID DUPLEX ULTRASOUND TECHNIQUE: Pearline Cables scale imaging, color Doppler and duplex ultrasound were performed of bilateral carotid and vertebral arteries in the neck. COMPARISON:  None. FINDINGS: Criteria: Quantification of carotid stenosis is based on velocity parameters that correlate the residual internal carotid diameter with NASCET-based stenosis levels, using the diameter of the distal internal carotid lumen as the denominator for stenosis measurement. The following velocity measurements were obtained: RIGHT ICA: 130/21 cm/sec CCA: 82/80 cm/sec SYSTOLIC ICA/CCA RATIO:  1.3 ECA: 145 cm/sec LEFT ICA: 90/21 cm/sec CCA: 03/49 cm/sec SYSTOLIC ICA/CCA RATIO:  0.9 ECA: 71 cm/sec RIGHT CAROTID ARTERY: Minor echogenic shadowing plaque formation.  No hemodynamically significant right ICA stenosis, velocity elevation, or turbulent flow. Degree of narrowing less than 50%. RIGHT VERTEBRAL ARTERY:  Antegrade LEFT CAROTID ARTERY: Similar scattered minor echogenic plaque formation. No hemodynamically significant left ICA stenosis, velocity elevation, or turbulent flow. LEFT VERTEBRAL ARTERY:  Antegrade IMPRESSION: Minor carotid atherosclerosis. No hemodynamically significant ICA stenosis. Degree of narrowing less than 50% bilaterally by ultrasound criteria. Patent antegrade vertebral flow bilaterally Electronically Signed   By: Jerilynn Mages.  Shick M.D.   On: 09/10/2018 09:25    Scheduled Meds: . letrozole  2.5 mg Oral Daily  . lisinopril  10 mg Oral Daily  . pantoprazole (PROTONIX) IV  40 mg Intravenous Q12H  . pravastatin  10 mg Oral Daily   Continuous Infusions:  Assessment/Plan:  1. Acute blood loss anemia with diverticular bleed.  Will need to watch a longer period of time and make sure she stops bleeding prior to going home.  Patient received a blood transfusion last night.  No anti-inflammatories.  GI signed off.  Bleeding scan negative so vascular surgery would not do any embolization.  If hemorrhages I can reorder a bleeding scan.  General surgery following.  Last resort would be a hemicolectomy.  Advance diet to full liquid diet. 2. Syncope secondary to acute blood loss anemia 3. Hypertension on lisinopril 4. Hyperlipidemia unspecified on pravastatin 5. Right breast cancer undergoing letrozole treatment 6. History of CAD.  Holding blood thinners since last admission.  Code Status:     Code Status Orders  (From admission, onward)         Start     Ordered   09/10/18 0052  Full code  Continuous     09/10/18 0056        Code Status History    Date Active Date Inactive Code Status Order ID Comments User Context   09/05/2018 0546 09/08/2018 1747 Partial Code [id number]  Harrie Foreman, MD Inpatient   03/31/2018 1723 04/01/2018 2155  Partial Code [id number]  Gorden Harms, MD ED   08/18/2014 1831 08/20/2014 1300 Full Code [id number]  Theodoro Grist, MD Inpatient   Advance Care Planning Activity    Advance Directive Documentation     Most Recent Value  Type of Advance Directive  Healthcare Power of Attorney, Living will  Pre-existing out of facility DNR order (yellow form or pink MOST form)  -  "MOST" Form in Place?  -     Family Communication: Spoke with the patient's son on the phone Disposition Plan: To be determined  Time spent: 28 minutes  [place]

## 2018-09-11 NOTE — Plan of Care (Signed)
  Problem: Education: Goal: Knowledge of General Education information will improve Description Including pain rating scale, medication(s)/side effects and non-pharmacologic comfort measures Outcome: Progressing   Problem: Health Behavior/Discharge Planning: Goal: Ability to manage health-related needs will improve Outcome: Progressing   

## 2018-09-12 ENCOUNTER — Encounter: Payer: Self-pay | Admitting: Gastroenterology

## 2018-09-12 ENCOUNTER — Inpatient Hospital Stay: Payer: PPO

## 2018-09-12 LAB — URINALYSIS, COMPLETE (UACMP) WITH MICROSCOPIC
Bacteria, UA: NONE SEEN
Bilirubin Urine: NEGATIVE
Glucose, UA: NEGATIVE mg/dL
Hgb urine dipstick: NEGATIVE
Ketones, ur: 5 mg/dL — AB
Leukocytes,Ua: NEGATIVE
Nitrite: NEGATIVE
Protein, ur: NEGATIVE mg/dL
Specific Gravity, Urine: 1.009 (ref 1.005–1.030)
pH: 5 (ref 5.0–8.0)

## 2018-09-12 LAB — CBC
HCT: 26.4 % — ABNORMAL LOW (ref 36.0–46.0)
Hemoglobin: 8.4 g/dL — ABNORMAL LOW (ref 12.0–15.0)
MCH: 29 pg (ref 26.0–34.0)
MCHC: 31.8 g/dL (ref 30.0–36.0)
MCV: 91 fL (ref 80.0–100.0)
Platelets: 151 10*3/uL (ref 150–400)
RBC: 2.9 MIL/uL — ABNORMAL LOW (ref 3.87–5.11)
RDW: 16.9 % — ABNORMAL HIGH (ref 11.5–15.5)
WBC: 7.1 10*3/uL (ref 4.0–10.5)
nRBC: 0 % (ref 0.0–0.2)

## 2018-09-12 LAB — NOVEL CORONAVIRUS, NAA (HOSP ORDER, SEND-OUT TO REF LAB; TAT 18-24 HRS): SARS-CoV-2, NAA: NOT DETECTED

## 2018-09-12 LAB — HEMOGLOBIN: Hemoglobin: 9.7 g/dL — ABNORMAL LOW (ref 12.0–15.0)

## 2018-09-12 MED ORDER — TECHNETIUM TC 99M-LABELED RED BLOOD CELLS IV KIT
20.0000 | PACK | Freq: Once | INTRAVENOUS | Status: AC | PRN
  Administered 2018-09-12: 18:00:00 19.53 via INTRAVENOUS

## 2018-09-12 NOTE — Progress Notes (Signed)
Patient had large bloody bowel movement, MD made aware. Madlyn Frankel, RN

## 2018-09-12 NOTE — Progress Notes (Signed)
NM Tech On-Call made aware of order for bleeding scan. Madlyn Frankel, RN

## 2018-09-12 NOTE — Progress Notes (Signed)
Patient ID: [name] [name], female   DOB: [birth year], 78 y.o.   MRN: [id number]  [facility] PROGRESS NOTE  MAKYLIE RIVERE ZJI:[id number] DOB: [birth year] DOA: 09/09/2018 PCP: Jerrol Banana., MD  HPI/Subjective: Patient had slight bloody bowel movement yesterday.  Was notified this morning that she had a slight bloody bowel movement this morning.  Just had a large bloody bowel movement this afternoon.  Objective: Vitals:   09/12/18 0457 09/12/18 1447  BP: (!) 154/65 122/62  Pulse: 75 97  Resp: 17 20  Temp: 98.7 F (37.1 C) (!) 97.4 F (36.3 C)  SpO2: 91% 98%    Intake/Output Summary (Last 24 hours) at 09/12/2018 1617 Last data filed at 09/12/2018 0953 Gross per 24 hour  Intake 240 ml  Output -  Net 240 ml   Filed Weights   09/09/18 2321  Weight: 73.5 kg    ROS: Review of Systems  Constitutional: Negative for chills and fever.  Eyes: Negative for blurred vision.  Respiratory: Negative for cough and shortness of breath.   Cardiovascular: Negative for chest pain.  Gastrointestinal: Positive for blood in stool. Negative for abdominal pain, constipation, diarrhea, nausea and vomiting.  Genitourinary: Negative for dysuria.  Musculoskeletal: Negative for joint pain.  Neurological: Negative for dizziness and headaches.   Exam: Physical Exam  HENT:  Nose: No mucosal edema.  Mouth/Throat: No oropharyngeal exudate or posterior oropharyngeal edema.  Eyes: Pupils are equal, round, and reactive to light. Conjunctivae, EOM and lids are normal.  Neck: No JVD present. Carotid bruit is not present. No edema present. No thyroid mass and no thyromegaly present.  Cardiovascular: S1 normal and S2 normal. Exam reveals no gallop.  No murmur heard. Pulses:      Dorsalis pedis pulses are 2+ on the right side and 2+ on the left side.  Respiratory: No respiratory distress. She has no wheezes. She has no rhonchi. She has no rales.  GI: Soft. Bowel sounds are normal. There is no  abdominal tenderness.  Musculoskeletal:     Right ankle: She exhibits no swelling.     Left ankle: She exhibits no swelling.  Lymphadenopathy:    She has no cervical adenopathy.  Neurological: She is alert. No cranial nerve deficit.  Skin: Skin is warm. No rash noted. Nails show no clubbing.  Psychiatric: She has a normal mood and affect.      Data Reviewed: Basic Metabolic Panel: Recent Labs  Lab 09/06/18 0617 09/09/18 2333 09/10/18 0214 09/11/18 0336  NA 144 142 142 143  K 4.0 3.5 3.8 3.9  CL 115* 108 109 114*  CO2 21* 27 26 23   GLUCOSE 104* 131* 125* 94  BUN 20 24* 24* 14  CREATININE 0.94 1.01* 0.97 0.84  CALCIUM 8.1* 8.3* 8.3* 8.0*   Liver Function Tests: Recent Labs  Lab 09/09/18 2333  AST 19  ALT 14  ALKPHOS 33*  BILITOT 0.7  PROT 5.5*  ALBUMIN 3.2*   No results for input(s): LIPASE, AMYLASE in the last 168 hours. No results for input(s): AMMONIA in the last 168 hours. CBC: Recent Labs  Lab 09/06/18 0617  09/09/18 2333 09/10/18 0214 09/10/18 0448 09/10/18 1349 09/11/18 0336 09/12/18 0414  WBC 9.8  --  11.8* 11.0*  --   --  6.6 7.1  NEUTROABS  --   --  9.2*  --   --   --   --   --   HGB 8.0*   < > 9.0* 9.4* 9.0* 7.6* 8.4*  8.4*  HCT 25.3*  --  27.2* 29.1* 27.5* 23.6* 26.3* 26.4*  MCV 97.3  --  93.5 95.4  --   --  90.4 91.0  PLT 124*  --  161 159  --   --  122* 151   < > = values in this interval not displayed.     Recent Results (from the past 240 hour(s))  SARS Coronavirus 2 (CEPHEID - Performed in [place] hospital lab), Hosp Order     Status: None   Collection Time: 09/05/18  1:53 AM   Specimen: Nasopharyngeal Swab  Result Value Ref Range Status   SARS Coronavirus 2 NEGATIVE NEGATIVE Final    Comment: (NOTE) If result is NEGATIVE SARS-CoV-2 target nucleic acids are NOT DETECTED. The SARS-CoV-2 RNA is generally detectable in upper and lower  respiratory specimens during the acute phase of infection. The lowest  concentration of  SARS-CoV-2 viral copies this assay can detect is 250  copies / mL. A negative result does not preclude SARS-CoV-2 infection  and should not be used as the sole basis for treatment or other  patient management decisions.  A negative result may occur with  improper specimen collection / handling, submission of specimen other  than nasopharyngeal swab, presence of viral mutation(s) within the  areas targeted by this assay, and inadequate number of viral copies  (<250 copies / mL). A negative result must be combined with clinical  observations, patient history, and epidemiological information. If result is POSITIVE SARS-CoV-2 target nucleic acids are DETECTED. The SARS-CoV-2 RNA is generally detectable in upper and lower  respiratory specimens dur ing the acute phase of infection.  Positive  results are indicative of active infection with SARS-CoV-2.  Clinical  correlation with patient history and other diagnostic information is  necessary to determine patient infection status.  Positive results do  not rule out bacterial infection or co-infection with other viruses. If result is PRESUMPTIVE POSTIVE SARS-CoV-2 nucleic acids MAY BE PRESENT.   A presumptive positive result was obtained on the submitted specimen  and confirmed on repeat testing.  While 2019 novel coronavirus  (SARS-CoV-2) nucleic acids may be present in the submitted sample  additional confirmatory testing may be necessary for epidemiological  and / or clinical management purposes  to differentiate between  SARS-CoV-2 and other Sarbecovirus currently known to infect humans.  If clinically indicated additional testing with an alternate test  methodology [phone]) is advised. The SARS-CoV-2 RNA is generally  detectable in upper and lower respiratory sp ecimens during the acute  phase of infection. The expected result is Negative. Fact Sheet for Patients:  StrictlyIdeas.no Fact Sheet for Healthcare  Providers: BankingDealers.co.za This test is not yet approved or cleared by the [place] FDA and has been authorized for detection and/or diagnosis of SARS-CoV-2 by FDA under an Emergency Use Authorization (EUA).  This EUA will remain in effect (meaning this test can be used) for the duration of the COVID-19 declaration under Section 564(b)(1) of the Act, 21 U.S.C. section 360bbb-3(b)(1), unless the authorization is terminated or revoked sooner. Performed at [facility], [address] [place], [place] [address]   Novel Coronavirus,NAA,(SEND-OUT TO REF LAB - TAT 24-48 hrs); Hosp Order     Status: None   Collection Time: 09/10/18 12:45 AM   Specimen: Respiratory  Result Value Ref Range Status   SARS-CoV-2, NAA NOT DETECTED NOT DETECTED Final    Comment: (NOTE) This test was developed and its performance characteristics determined by [employer].  This test has not been FDA cleared or approved. This test has been authorized by FDA under an Emergency Use Authorization (EUA). This test is only authorized for the duration of time the declaration that circumstances exist justifying the authorization of the emergency use of in vitro diagnostic tests for detection of SARS-CoV-2 virus and/or diagnosis of COVID-19 infection under section 564(b)(1) of the Act, 21 U.S.C. 458KDX-8(P)(3), unless the authorization is terminated or revoked sooner. When diagnostic testing is negative, the possibility of a false negative result should be considered in the context of a patient's recent exposures and the presence of clinical signs and symptoms consistent with COVID-19. An individual without symptoms of COVID-19 and who is not shedding SARS-CoV-2 virus would expect to have a negative (not detected) result in this assay. Performed  At: [facility] [place], [place] [id number] Rush Farmer MD BH:[id number]    [place]  Final    Comment: Performed at [facility], [address] [place], [place] [address]     Studies: Nm Gi Blood Loss  Result Date: 09/10/2018 CLINICAL DATA:  6 bloody bowel movements today. EXAM: NUCLEAR MEDICINE GASTROINTESTINAL BLEEDING SCAN TECHNIQUE: Sequential abdominal images were obtained following intravenous administration of Tc-70m labeled red blood cells. RADIOPHARMACEUTICALS:  21.2 mCi Tc-57m pertechnetate in-vitro labeled red cells. COMPARISON:  08/09/2018 FINDINGS: Imaging was performed over a 2 hour period. No findings suspicious for active GI bleed are identified. IMPRESSION: Negative nuclear medicine bleeding scan. Electronically Signed   By: Marijo Sanes M.D.   On: 09/10/2018 17:33    Scheduled Meds: . letrozole  2.5 mg Oral Daily  . lisinopril  10 mg Oral Daily  . pantoprazole (PROTONIX) IV  40 mg Intravenous Q12H  . pravastatin  10 mg Oral Daily   Continuous Infusions:  Assessment/Plan:  1. Acute blood loss anemia with diverticular bleed.  Since the patient had another large bleeding episode, I will get a bleeding scan.  We will get another hemoglobin.  Check another hemoglobin tomorrow morning.  Vascular surgery would not do anything unless a bleeding scan is positive.  General surgery would rather not do a hemicolectomy or colectomy.  Spoke with the nuclear medicine department and they will call and attack to repeat the bleeding scan. 2. Syncope secondary to acute blood loss anemia 3. Hypertension on lisinopril 4. Hyperlipidemia unspecified on pravastatin 5. Right breast cancer undergoing letrozole treatment 6. History of CAD.  Holding blood thinners since last admission.  Code Status:     Code Status Orders  (From admission, onward)         Start     Ordered   09/10/18 0052  Full code  Continuous     09/10/18 0056        Code Status History    Date Active Date Inactive Code Status Order ID Comments User Context   09/05/2018  0546 09/08/2018 1747 Partial Code [id number]  Harrie Foreman, MD Inpatient   03/31/2018 1723 04/01/2018 2155 Partial Code [id number]  Gorden Harms, MD ED   08/18/2014 1831 08/20/2014 1300 Full Code [id number]  Theodoro Grist, MD Inpatient   Advance Care Planning Activity    Advance Directive Documentation     Most Recent Value  Type of Advance Directive  Healthcare Power of Attorney, Living will  Pre-existing out of facility DNR order (yellow form or pink MOST form)  -  "MOST" Form in Place?  -     Family Communication: Spoke with son  on the phone Disposition Plan: To be determined  Time spent: 28 minutes.  Spoke with nuclear medicine department to call intact for bleeding scan.  Charly Holcomb [employer]

## 2018-09-13 ENCOUNTER — Telehealth: Payer: Self-pay | Admitting: *Deleted

## 2018-09-13 LAB — BPAM RBC
Blood Product Expiration Date: 202008052359
Blood Product Expiration Date: 202008052359
Blood Product Expiration Date: 202008052359
ISSUE DATE / TIME: 202007052038
Unit Type and Rh: 6200
Unit Type and Rh: 6200
Unit Type and Rh: 6200

## 2018-09-13 LAB — TYPE AND SCREEN
ABO/RH(D): A POS
Antibody Screen: NEGATIVE
Unit division: 0
Unit division: 0
Unit division: 0

## 2018-09-13 LAB — PREPARE RBC (CROSSMATCH)

## 2018-09-13 LAB — HEMOGLOBIN: Hemoglobin: 8 g/dL — ABNORMAL LOW (ref 12.0–15.0)

## 2018-09-13 NOTE — Progress Notes (Signed)
[place] [facility] [place] NAME: [name] [name]    MR#:  [id number]  DATE OF BIRTH:  May 21, 1940  SUBJECTIVE:  CHIEF COMPLAINT:   Chief Complaint  Patient presents with  . Near Syncope   No new complaint this morning.  Patient reported to have had bleeding yesterday and had a bleeding scan last night which was negative.  No further bleeding this morning. REVIEW OF SYSTEMS:  Review of Systems  Constitutional: Negative for chills and fever.  HENT: Negative for hearing loss and tinnitus.   Eyes: Negative for blurred vision and double vision.  Respiratory: Negative for cough and hemoptysis.   Cardiovascular: Negative for chest pain and palpitations.  Gastrointestinal: Negative for heartburn and nausea.       Blood in stool yesterday  Genitourinary: Negative for dysuria and urgency.  Musculoskeletal: Negative for myalgias and neck pain.  Skin: Negative for itching and rash.  Neurological: Negative for dizziness and headaches.  Psychiatric/Behavioral: Negative for depression and hallucinations.    DRUG ALLERGIES:   Allergies  Allergen Reactions  . Celebrex  [Celecoxib]     Dark stools.  . Penicillins Other (See Comments)    Childhood reaction Has patient had a PCN reaction causing immediate rash, facial/tongue/throat swelling, SOB or lightheadedness with hypotension: Unknown Has patient had a PCN reaction causing severe rash involving mucus membranes or skin necrosis: Unknown Has patient had a PCN reaction that required hospitalization: Unknown Has patient had a PCN reaction occurring within the last 10 years: Unknown If all of the above answers are "NO", then may proceed with Cephalosporin use.   Marland Kitchen Prevacid [Lansoprazole] Diarrhea   VITALS:  Blood pressure 130/63, pulse 75, temperature 98.6 F (37 C), temperature source Oral, resp. rate 16, height 5\' 4"  (1.626 m), weight 73.5 kg, SpO2 100 %. PHYSICAL EXAMINATION:   Physical Exam   Constitutional: She is oriented to person, place, and time. She appears well-developed.  HENT:  Head: Atraumatic.  Right Ear: External ear normal.  Eyes: Pupils are equal, round, and reactive to light. Conjunctivae are normal. Right eye exhibits no discharge.  Neck: Normal range of motion. Neck supple. No thyromegaly present.  Cardiovascular: Normal rate, regular rhythm and normal heart sounds.  Respiratory: Effort normal and breath sounds normal. She has no wheezes.  GI: Soft. Bowel sounds are normal. She exhibits no distension.  Musculoskeletal: Normal range of motion.        General: No edema.  Neurological: She is alert and oriented to person, place, and time. No cranial nerve deficit.  Skin: Skin is warm. She is not diaphoretic. No erythema.  Psychiatric: She has a normal mood and affect. Her behavior is normal.   LABORATORY PANEL:  Female CBC Recent Labs  Lab 09/12/18 0414  09/13/18 0602  WBC 7.1  --   --   HGB 8.4*   < > 8.0*  HCT 26.4*  --   --   PLT 151  --   --    < > = values in this interval not displayed.   ------------------------------------------------------------------------------------------------------------------ Chemistries  Recent Labs  Lab 09/09/18 2333  09/11/18 0336  NA 142   < > 143  K 3.5   < > 3.9  CL 108   < > 114*  CO2 27   < > 23  GLUCOSE 131*   < > 94  BUN 24*   < > 14  CREATININE 1.01*   < > 0.84  CALCIUM 8.3*   < >  8.0*  AST 19  --   --   ALT 14  --   --   ALKPHOS 33*  --   --   BILITOT 0.7  --   --    < > = values in this interval not displayed.   RADIOLOGY:  Nm Gi Blood Loss  Result Date: 09/12/2018 CLINICAL DATA:  Red blood in bowel movements, multiple transfusions EXAM: NUCLEAR MEDICINE GASTROINTESTINAL BLEEDING SCAN TECHNIQUE: Sequential abdominal images were obtained through 2 hours following intravenous administration of Tc-84m labeled red blood cells. RADIOPHARMACEUTICALS:  19.5 mCi Tc-53m pertechnetate in-vitro labeled red  cells. COMPARISON:  09/10/2018, 09/05/2018 FINDINGS: Expected blood pool activity without evidence of radiotracer localizing to bowel, or evidence of peristalsing radiotracer activity. IMPRESSION: Negative nuclear scintigraphic tagged red blood cell gastrointestinal bleeding scan. Electronically Signed   By: Eddie Candle M.D.   On: 09/12/2018 21:03   ASSESSMENT AND PLAN:   1. Acute blood loss anemia with diverticular bleed.  Since the patient had another large bleeding episode yesterday and subsequently had a repeat bleeding scan last night which was negative.  Hemoglobin fairly stable at 8.0 this morning.  No further bleeding this morning.  Vascular surgery would not do anything unless a bleeding scan is positive.  General surgery would rather not do a hemicolectomy or colectomy.    Plan is to monitor till am and then show no further bleeding or acute drop in hemoglobin.  If hemoglobin remained stable in a.m. we will discharge tomorrow. 2. Syncope secondary to acute blood loss anemia 2. Hypertension; blood pressure controlled on lisinopril 3. Hyperlipidemia unspecified on pravastatin 4. Right breast cancer undergoing letrozole treatment 5. History of CAD.  Holding blood thinners since last admission.  DVT prophylaxis; SCDs No heparin products due to GI bleed  All the records are reviewed and case discussed with Care Management/Social Worker. Management plans discussed with the patient, family and they are in agreement.  CODE STATUS: Full Code  TOTAL TIME TAKING CARE OF THIS PATIENT: 28 minutes.   More than 50% of the time was spent in counseling/coordination of care: YES  POSSIBLE D/C IN 1 DAY, DEPENDING ON CLINICAL CONDITION.   Karsyn Rochin M.D on 09/13/2018 at 1:10 PM  Between 7am to 6pm - Pager - [phone]  After 6pm go to [email or web address] - password [email or web address] [facility]  [facility] [place] [facility]  Office  [phone]  CC: Primary care physician; Jerrol Banana., MD   Note: This dictation was prepared with Dragon dictation along with smaller phrase technology. Any transcriptional errors that result from this process are unintentional.

## 2018-09-13 NOTE — Telephone Encounter (Signed)
LM to call our office to schedule a 4-6 wk f/u per Dr. Bonna Gains post procedure.

## 2018-09-14 LAB — GLUCOSE, CAPILLARY: Glucose-Capillary: 85 mg/dL (ref 70–99)

## 2018-09-14 LAB — CBC
HCT: 27.9 % — ABNORMAL LOW (ref 36.0–46.0)
Hemoglobin: 8.7 g/dL — ABNORMAL LOW (ref 12.0–15.0)
MCH: 28.7 pg (ref 26.0–34.0)
MCHC: 31.2 g/dL (ref 30.0–36.0)
MCV: 92.1 fL (ref 80.0–100.0)
Platelets: 196 10*3/uL (ref 150–400)
RBC: 3.03 MIL/uL — ABNORMAL LOW (ref 3.87–5.11)
RDW: 16.1 % — ABNORMAL HIGH (ref 11.5–15.5)
WBC: 6 10*3/uL (ref 4.0–10.5)
nRBC: 0 % (ref 0.0–0.2)

## 2018-09-14 NOTE — Progress Notes (Signed)
Patient discharged. Irja Wheless S, RN  

## 2018-09-14 NOTE — Progress Notes (Signed)
Discharge instructions given and went over with patient at bedside. Follow-up appointments reviewed. All questions answered. Madlyn Frankel, RN

## 2018-09-14 NOTE — Discharge Summary (Signed)
[place] at [place] NAME: [name] [name]    MR#:  [id number]  DATE OF BIRTH:  December 30, 1940  DATE OF ADMISSION:  09/09/2018   ADMITTING PHYSICIAN: Christel Mormon, MD  DATE OF DISCHARGE: 09/14/2018.  PRIMARY CARE PHYSICIAN: Jerrol Banana., MD   ADMISSION DIAGNOSIS:  Lower GI bleed [K92.2] Syncope, unspecified syncope type [R55] DISCHARGE DIAGNOSIS:  Active Problems:   Lower GI bleeding  SECONDARY DIAGNOSIS:   Past Medical History:  Diagnosis Date  . Breast cancer ([place]) 2018   Right breast  . 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 Mucin noted on biopsy, not on wide excision.  . Coronary artery disease   . GERD (gastroesophageal reflux disease)   . Hyperlipidemia   . Hypertension   . Lichen planus 34/19/3790   Right breast in field of whole breast radiation. Dr. Kellie Moor DX by punch bioppsy  . Lichen planus    right breast   . Lichen planus   . MI (myocardial infarction) ([place])    2015  . Personal history of radiation therapy 09/2016   RIGHT lumpectomy w/ radiation   HOSPITAL COURSE:  Chief complaint; near syncope  History of presenting complaint; [name] [name]  is a 78 y.o. African-American female with a known history of multiple medical problems recently diagnosed diverticulosis with lower GI bleeding for which she was admitted here few days prior to this admission and discharged on 09/08/2018.  She underwent EGD that was unremarkable and a colonoscopy showed polyps that were excised and diverticulosis.  Her hemoglobin dropped to 7.2 and she was transfused a total of 2 units of packed red blood cells during her hospital stay.  The night prior to presentation she was watching TV with her husband and apparently had a syncopal episode after having 2 bright red bleeding per rectum episodes.  Patient was admitted for further evaluation and management.   Hospital course;  Acute blood loss anemia with diverticular bleed.  During the course of this admission patient had 2 bleeding scans done due to rectal bleed.  Blood bleeding scans done were negative.  No further bleeding for more than 24 hours.  Hemoglobin remained stable at 8.7 this morning. General surgery would rather not do a hemicolectomy or colectomy.  Seen by gastroenterologist and signed off.  To follow-up with Dr. Bonna Gains in 4 weeks.  2. Syncope secondary to acute blood loss anemia.  Patient remains asymptomatic. 2. Hypertension; blood pressure controlled on lisinopril 3. Hyperlipidemia unspecified on pravastatin 4. Right breast cancer undergoing letrozole treatment 5. History of CAD. Holding blood thinners since last admission.  Patient clinically and hemodynamically stable.  Wishes to be discharged home today.  Called patient's husband Mr. [name] today.  Left a message for him shortly son about treatment and discharge plans as outlined above.  DISCHARGE CONDITIONS:  Stable CONSULTS OBTAINED:  Treatment Team:  Benjamine Sprague, DO DRUG ALLERGIES:   Allergies  Allergen Reactions  . Celebrex  [Celecoxib]     Dark stools.  . Penicillins Other (See Comments)    Childhood reaction Has patient had a PCN reaction causing immediate rash, facial/tongue/throat swelling, SOB or lightheadedness with hypotension: Unknown Has patient had a PCN reaction causing severe rash involving mucus membranes or skin necrosis: Unknown Has patient had a PCN reaction that required hospitalization: Unknown Has patient had a PCN reaction occurring within the last 10 years: Unknown If all of the above  answers are "NO", then may proceed with Cephalosporin use.   Marland Kitchen Prevacid [Lansoprazole] Diarrhea   DISCHARGE MEDICATIONS:   Allergies as of 09/14/2018      Reactions   Celebrex  [celecoxib]    Dark stools.   Penicillins Other (See Comments)   Childhood reaction Has patient had a PCN reaction causing immediate rash,  facial/tongue/throat swelling, SOB or lightheadedness with hypotension: Unknown Has patient had a PCN reaction causing severe rash involving mucus membranes or skin necrosis: Unknown Has patient had a PCN reaction that required hospitalization: Unknown Has patient had a PCN reaction occurring within the last 10 years: Unknown If all of the above answers are "NO", then may proceed with Cephalosporin use.   Prevacid [lansoprazole] Diarrhea      Medication List    TAKE these medications   Clobetasol Prop Emollient Base 0.05 % emollient cream as needed.   letrozole 2.5 MG tablet Commonly known as: FEMARA Take 2.5 mg by mouth daily.   lisinopril 10 MG tablet Commonly known as: ZESTRIL Take 1 tablet (10 mg total) by mouth daily.   nystatin cream Commonly known as: MYCOSTATIN Apply 1 application topically 2 (two) times daily.   omeprazole 20 MG capsule Commonly known as: PRILOSEC Take 1 capsule (20 mg total) by mouth daily as needed (indigestion).   pravastatin 10 MG tablet Commonly known as: PRAVACHOL Take 10 mg by mouth daily.        DISCHARGE INSTRUCTIONS:   DIET:  Cardiac diet DISCHARGE CONDITION:  Stable ACTIVITY:  Activity as tolerated OXYGEN:  Home Oxygen: No.  Oxygen Delivery: room air DISCHARGE LOCATION:  home   If you experience worsening of your admission symptoms, develop shortness of breath, life threatening emergency, suicidal or homicidal thoughts you must seek medical attention immediately by calling 911 or calling your MD immediately  if symptoms less severe.  You Must read complete instructions/literature along with all the possible adverse reactions/side effects for all the Medicines you take and that have been prescribed to you. Take any new Medicines after you have completely understood and accpet all the possible adverse reactions/side effects.   Please note  You were cared for by a hospitalist during your hospital stay. If you have any  questions about your discharge medications or the care you received while you were in the hospital after you are discharged, you can call the unit and asked to speak with the hospitalist on call if the hospitalist that took care of you is not available. Once you are discharged, your primary care physician will handle any further medical issues. Please note that NO REFILLS for any discharge medications will be authorized once you are discharged, as it is imperative that you return to your primary care physician (or establish a relationship with a primary care physician if you do not have one) for your aftercare needs so that they can reassess your need for medications and monitor your lab values.    On the day of Discharge:  VITAL SIGNS:  Blood pressure 132/68, pulse 74, temperature 98.5 F (36.9 C), resp. rate 16, height 5\' 4"  (1.626 m), weight 73.5 kg, SpO2 100 %. PHYSICAL EXAMINATION:  GENERAL:  78 y.o.-year-old patient lying in the bed with no acute distress.  EYES: Pupils equal, round, reactive to light and accommodation. No scleral icterus. Extraocular muscles intact.  HEENT: Head atraumatic, normocephalic. Oropharynx and nasopharynx clear.  NECK:  Supple, no jugular venous distention. No thyroid enlargement, no tenderness.  LUNGS: Normal breath sounds bilaterally,  no wheezing, rales,rhonchi or crepitation. No use of accessory muscles of respiration.  CARDIOVASCULAR: S1, S2 normal. No murmurs, rubs, or gallops.  ABDOMEN: Soft, non-tender, non-distended. Bowel sounds present. No organomegaly or mass.  EXTREMITIES: No pedal edema, cyanosis, or clubbing.  NEUROLOGIC: Cranial nerves II through XII are intact. Muscle strength 5/5 in all extremities. Sensation intact. Gait not checked.  PSYCHIATRIC: The patient is alert and oriented x 3.  SKIN: No obvious rash, lesion, or ulcer.  DATA REVIEW:   CBC Recent Labs  Lab 09/14/18 0417  WBC 6.0  HGB 8.7*  HCT 27.9*  PLT 196    Chemistries   Recent Labs  Lab 09/09/18 2333  09/11/18 0336  NA 142   < > 143  K 3.5   < > 3.9  CL 108   < > 114*  CO2 27   < > 23  GLUCOSE 131*   < > 94  BUN 24*   < > 14  CREATININE 1.01*   < > 0.84  CALCIUM 8.3*   < > 8.0*  AST 19  --   --   ALT 14  --   --   ALKPHOS 33*  --   --   BILITOT 0.7  --   --    < > = values in this interval not displayed.     Microbiology Results  Results for orders placed or performed during the hospital encounter of 09/09/18  Novel Coronavirus,NAA,(SEND-OUT TO REF LAB - TAT 24-48 hrs); Hosp Order     Status: None   Collection Time: 09/10/18 12:45 AM   Specimen: Respiratory  Result Value Ref Range Status   SARS-CoV-2, NAA NOT DETECTED NOT DETECTED Final    Comment: (NOTE) This test was developed and its performance characteristics determined by [employer]. This test has not been FDA cleared or approved. This test has been authorized by FDA under an Emergency Use Authorization (EUA). This test is only authorized for the duration of time the declaration that circumstances exist justifying the authorization of the emergency use of in vitro diagnostic tests for detection of SARS-CoV-2 virus and/or diagnosis of COVID-19 infection under section 564(b)(1) of the Act, 21 U.S.C. 638VFI-4(P)(3), unless the authorization is terminated or revoked sooner. When diagnostic testing is negative, the possibility of a false negative result should be considered in the context of a patient's recent exposures and the presence of clinical signs and symptoms consistent with COVID-19. An individual without symptoms of COVID-19 and who is not shedding SARS-CoV-2 virus would expect to have a negative (not detected) result in this assay. Performed  At: [facility] [place], [place] [id number] Rush Farmer MD SA:[id number]    [place]  Final    Comment: Performed at [facility], [address]  [place], [place] [address]    RADIOLOGY:  No results found.   Management plans discussed with the patient, family and they are in agreement.  CODE STATUS: Full Code   TOTAL TIME TAKING CARE OF THIS PATIENT: 35 minutes.    Hayes Czaja M.D on 09/14/2018 at 10:44 AM  Between 7am to 6pm - Pager - [phone]  After 6pm go to [email or web address] - password [email or web address] [facility]  [facility] [place] [facility]  Office  [phone]  CC: Primary care physician; Jerrol Banana., MD   Note: This dictation was prepared with Dragon dictation along with smaller phrase technology. Any transcriptional errors that result from this process are unintentional.

## 2018-09-14 NOTE — Care Management Important Message (Signed)
Important Message  Patient Details  Name: [name] [name] MRN: [id number] Date of Birth: 1940/09/28   Medicare Important Message Given:  Yes     Juliann Pulse A Charlene Detter 09/14/2018, 11:15 AM

## 2018-09-15 NOTE — Chronic Care Management (AMB) (Signed)
°  Chronic Care Management   Outreach Note  09/15/2018 Name: [name] [name] MRN: [id number] DOB: October 27, 1940  Referred by: Jerrol Banana., MD Reason for referral : Chronic Care Management (Initial CCM outreach was unsuccessful. ), Chronic Care Management (Second CCM outreach was unsuccessful ), and Chronic Care Management (Third CCM Outreach was unsuccessful)   Third unsuccessful telephone outreach was attempted today. The patient was referred to the case management team for assistance with chronic care management and care coordination. The patient's primary care provider has been notified of our unsuccessful attempts to make or maintain contact with the patient. The care management team is pleased to engage with this patient at any time in the future should he/she be interested in assistance from the care management team.   Follow Up Plan: The care management team is available to follow up with the patient after provider conversation with the patient regarding recommendation for care management engagement and subsequent re-referral to the care management team.   [place]  [email or web address]   ??[phone]

## 2018-09-19 ENCOUNTER — Other Ambulatory Visit: Payer: Self-pay

## 2018-09-19 ENCOUNTER — Ambulatory Visit (INDEPENDENT_AMBULATORY_CARE_PROVIDER_SITE_OTHER): Payer: PPO | Admitting: Family Medicine

## 2018-09-19 ENCOUNTER — Encounter: Payer: Self-pay | Admitting: Family Medicine

## 2018-09-19 VITALS — BP 157/75 | HR 83 | Temp 97.7°F | Resp 16 | Wt 141.6 lb

## 2018-09-19 DIAGNOSIS — C50211 Malignant neoplasm of upper-inner quadrant of right female breast: Secondary | ICD-10-CM

## 2018-09-19 DIAGNOSIS — Z170 Estrogen receptor positive status [ER+]: Secondary | ICD-10-CM

## 2018-09-19 DIAGNOSIS — I2510 Atherosclerotic heart disease of native coronary artery without angina pectoris: Secondary | ICD-10-CM

## 2018-09-19 DIAGNOSIS — K5730 Diverticulosis of large intestine without perforation or abscess without bleeding: Secondary | ICD-10-CM | POA: Diagnosis not present

## 2018-09-19 DIAGNOSIS — E7800 Pure hypercholesterolemia, unspecified: Secondary | ICD-10-CM

## 2018-09-19 DIAGNOSIS — K922 Gastrointestinal hemorrhage, unspecified: Secondary | ICD-10-CM | POA: Diagnosis not present

## 2018-09-19 NOTE — Progress Notes (Signed)
Patient: [name] [name] Female    DOB: July 04, 1940   78 y.o.   MRN: [id number] Visit Date: 09/19/2018  Today's Provider: Wilhemena Durie, MD   Chief Complaint  Patient presents with  . Hospitalization Follow-up   Subjective:     HPI  Follow up Hospitalization  Patient was admitted to [facility] ED on 09/09/2018 and discharged on 09/14/2018. She was treated for near syncope episode and lower G.I bleeding. Treatment for this included colonoscopy performed showed polyps that were excised and diverticulosis, patient had transfusion a total of 2 units or RBC. Patient had discontinued Aspirin use due to G.I bleed. Patient reports today that she is feeling well and has not further episodes of rectal bleeding or near syncope episodes.   She reports this condition is Improved.  ------------------------------------------------------------------------------------    Allergies  Allergen Reactions  . Celebrex  [Celecoxib]     Dark stools.  . Penicillins Other (See Comments)    Childhood reaction Has patient had a PCN reaction causing immediate rash, facial/tongue/throat swelling, SOB or lightheadedness with hypotension: Unknown Has patient had a PCN reaction causing severe rash involving mucus membranes or skin necrosis: Unknown Has patient had a PCN reaction that required hospitalization: Unknown Has patient had a PCN reaction occurring within the last 10 years: Unknown If all of the above answers are "NO", then may proceed with Cephalosporin use.   Marland Kitchen Prevacid [Lansoprazole] Diarrhea     Current Outpatient Medications:  .  Clobetasol Prop Emollient Base 0.05 % emollient cream, as needed. , Disp: , Rfl:  .  letrozole (FEMARA) 2.5 MG tablet, Take 2.5 mg by mouth daily., Disp: , Rfl:  .  lisinopril (ZESTRIL) 10 MG tablet, Take 1 tablet (10 mg total) by mouth daily., Disp: 90 tablet, Rfl: 3 .  nystatin cream (MYCOSTATIN), Apply 1 application topically 2 (two) times daily., Disp: 30  g, Rfl: 0 .  omeprazole (PRILOSEC) 20 MG capsule, Take 1 capsule (20 mg total) by mouth daily as needed (indigestion)., Disp: 30 capsule, Rfl: 5 .  pravastatin (PRAVACHOL) 10 MG tablet, Take 10 mg by mouth daily., Disp: , Rfl:   Review of Systems  Constitutional: Negative.   HENT: Negative.   Eyes: Negative.   Respiratory: Negative.   Cardiovascular: Negative.   Gastrointestinal: Negative.   Endocrine: Negative.   Musculoskeletal: Negative.   Allergic/Immunologic: Negative.   Neurological: Negative.   Psychiatric/Behavioral: Negative.     Social History   Tobacco Use  . Smoking status: Former Smoker    Packs/day: 0.25    Years: 10.00    Pack years: 2.50    Types: Cigarettes    Quit date: 03/06/1998    Years since quitting: 20.5  . Smokeless tobacco: Never Used  Substance Use Topics  . Alcohol use: No      Objective:   BP (!) 157/75   Pulse 83   Temp 97.7 F (36.5 C) (Oral)   Resp 16   Wt 141 lb 9.6 oz (64.2 kg)   BMI 24.31 kg/m  Vitals:   09/19/18 1116  BP: (!) 157/75  Pulse: 83  Resp: 16  Temp: 97.7 F (36.5 C)  TempSrc: Oral  Weight: 141 lb 9.6 oz (64.2 kg)     Physical Exam Vitals signs reviewed.  Constitutional:      Appearance: She is well-developed.  HENT:     Head: Normocephalic and atraumatic.     Right Ear: External ear normal.  Left Ear: External ear normal.     Nose: Nose normal.  Eyes:     General: No scleral icterus.    Conjunctiva/sclera: Conjunctivae normal.  Neck:     Thyroid: No thyromegaly.  Cardiovascular:     Rate and Rhythm: Normal rate and regular rhythm.     Heart sounds: Normal heart sounds.  Pulmonary:     Effort: Pulmonary effort is normal.     Breath sounds: Normal breath sounds.  Abdominal:     Palpations: Abdomen is soft.  Musculoskeletal:     Right lower leg: No edema.     Left lower leg: No edema.  Skin:    General: Skin is warm and dry.  Neurological:     General: No focal deficit present.      Mental Status: She is alert and oriented to person, place, and time. Mental status is at baseline.  Psychiatric:        Mood and Affect: Mood normal.        Behavior: Behavior normal.        Thought Content: Thought content normal.        Judgment: Judgment normal.      No results found for any visits on 09/19/18.     Assessment & Plan    1. Lower GI bleeding Probable diverticular bleed.Follow with GI. - CBC with Differential/Platelet - Iron, TIBC and Ferritin Panel  2. Diverticulosis of large intestine without diverticulitis  - CBC with Differential/Platelet - Iron, TIBC and Ferritin Panel  3. Atherosclerosis of coronary artery of native heart without angina pectoris, unspecified vessel or lesion type All risk factors treated.  4. Malignant neoplasm of upper-inner quadrant of right breast in female, estrogen receptor positive ([place])   5. Hypercholesteremia      Wilhemena Durie, MD  [facility] Fritzi Mandes [place] as a scribe for Wilhemena Durie, MD.,have documented all relevant documentation on the behalf of Wilhemena Durie, MD,as directed by  Wilhemena Durie, MD while in the presence of Wilhemena Durie, MD.

## 2018-09-20 DIAGNOSIS — K922 Gastrointestinal hemorrhage, unspecified: Secondary | ICD-10-CM | POA: Diagnosis not present

## 2018-09-20 DIAGNOSIS — K5730 Diverticulosis of large intestine without perforation or abscess without bleeding: Secondary | ICD-10-CM | POA: Diagnosis not present

## 2018-09-21 ENCOUNTER — Telehealth: Payer: Self-pay

## 2018-09-21 DIAGNOSIS — D649 Anemia, unspecified: Secondary | ICD-10-CM

## 2018-09-21 LAB — CBC WITH DIFFERENTIAL/PLATELET
Basophils Absolute: 0.1 10*3/uL (ref 0.0–0.2)
Basos: 1 %
EOS (ABSOLUTE): 0.2 10*3/uL (ref 0.0–0.4)
Eos: 3 %
Hematocrit: 27.4 % — ABNORMAL LOW (ref 34.0–46.6)
Hemoglobin: 8.8 g/dL — ABNORMAL LOW (ref 11.1–15.9)
Immature Grans (Abs): 0 10*3/uL (ref 0.0–0.1)
Immature Granulocytes: 0 %
Lymphocytes Absolute: 1.1 10*3/uL (ref 0.7–3.1)
Lymphs: 18 %
MCH: 28.6 pg (ref 26.6–33.0)
MCHC: 32.1 g/dL (ref 31.5–35.7)
MCV: 89 fL (ref 79–97)
Monocytes Absolute: 0.5 10*3/uL (ref 0.1–0.9)
Monocytes: 8 %
Neutrophils Absolute: 4.5 10*3/uL (ref 1.4–7.0)
Neutrophils: 70 %
Platelets: 356 10*3/uL (ref 150–450)
RBC: 3.08 x10E6/uL — ABNORMAL LOW (ref 3.77–5.28)
RDW: 15.2 % (ref 11.7–15.4)
WBC: 6.4 10*3/uL (ref 3.4–10.8)

## 2018-09-21 LAB — IRON,TIBC AND FERRITIN PANEL
Ferritin: 67 ng/mL (ref 15–150)
Iron Saturation: 13 % — ABNORMAL LOW (ref 15–55)
Iron: 39 ug/dL (ref 27–139)
Total Iron Binding Capacity: 310 ug/dL (ref 250–450)
UIBC: 271 ug/dL (ref 118–369)

## 2018-09-21 NOTE — Telephone Encounter (Signed)
-----   Message from Jerrol Banana., MD sent at 09/21/2018  1:11 PM EDT ----- Still anemic but stable--start iron daily if not taking--repeat CBC 1 week.

## 2018-09-21 NOTE — Telephone Encounter (Signed)
Patient advised.KW 

## 2018-09-26 ENCOUNTER — Encounter: Payer: Self-pay | Admitting: *Deleted

## 2018-09-26 DIAGNOSIS — D649 Anemia, unspecified: Secondary | ICD-10-CM | POA: Diagnosis not present

## 2018-09-27 LAB — CBC WITH DIFFERENTIAL/PLATELET
Basophils Absolute: 0.1 10*3/uL (ref 0.0–0.2)
Basos: 1 %
EOS (ABSOLUTE): 0.1 10*3/uL (ref 0.0–0.4)
Eos: 1 %
Hematocrit: 28.4 % — ABNORMAL LOW (ref 34.0–46.6)
Hemoglobin: 9.2 g/dL — ABNORMAL LOW (ref 11.1–15.9)
Immature Grans (Abs): 0 10*3/uL (ref 0.0–0.1)
Immature Granulocytes: 0 %
Lymphocytes Absolute: 1.2 10*3/uL (ref 0.7–3.1)
Lymphs: 18 %
MCH: 28.6 pg (ref 26.6–33.0)
MCHC: 32.4 g/dL (ref 31.5–35.7)
MCV: 88 fL (ref 79–97)
Monocytes Absolute: 0.5 10*3/uL (ref 0.1–0.9)
Monocytes: 8 %
Neutrophils Absolute: 4.8 10*3/uL (ref 1.4–7.0)
Neutrophils: 72 %
Platelets: 334 10*3/uL (ref 150–450)
RBC: 3.22 x10E6/uL — ABNORMAL LOW (ref 3.77–5.28)
RDW: 15.4 % (ref 11.7–15.4)
WBC: 6.6 10*3/uL (ref 3.4–10.8)

## 2018-10-04 ENCOUNTER — Encounter: Payer: Self-pay | Admitting: Oncology

## 2018-10-04 ENCOUNTER — Telehealth: Payer: Self-pay | Admitting: Oncology

## 2018-10-04 DIAGNOSIS — D649 Anemia, unspecified: Secondary | ICD-10-CM | POA: Diagnosis not present

## 2018-10-04 NOTE — Telephone Encounter (Signed)
Multiple attempts over the past 2 weeks have been made to contact [name] [name] to r\s appt on 7/30. The patient does not have a VM active ans has not answered the phone on any occasion. At this time I will mail a letter out to inform patient of canceled visit.

## 2018-10-05 ENCOUNTER — Encounter: Payer: Self-pay | Admitting: General Surgery

## 2018-10-05 DIAGNOSIS — M1711 Unilateral primary osteoarthritis, right knee: Secondary | ICD-10-CM | POA: Diagnosis not present

## 2018-10-05 LAB — CBC WITH DIFFERENTIAL/PLATELET
Basophils Absolute: 0.1 10*3/uL (ref 0.0–0.2)
Basos: 1 %
EOS (ABSOLUTE): 0.1 10*3/uL (ref 0.0–0.4)
Eos: 2 %
Hematocrit: 33.9 % — ABNORMAL LOW (ref 34.0–46.6)
Hemoglobin: 10.3 g/dL — ABNORMAL LOW (ref 11.1–15.9)
Immature Grans (Abs): 0 10*3/uL (ref 0.0–0.1)
Immature Granulocytes: 0 %
Lymphocytes Absolute: 1 10*3/uL (ref 0.7–3.1)
Lymphs: 16 %
MCH: 27.5 pg (ref 26.6–33.0)
MCHC: 30.4 g/dL — ABNORMAL LOW (ref 31.5–35.7)
MCV: 90 fL (ref 79–97)
Monocytes Absolute: 0.5 10*3/uL (ref 0.1–0.9)
Monocytes: 8 %
Neutrophils Absolute: 4.5 10*3/uL (ref 1.4–7.0)
Neutrophils: 73 %
Platelets: 270 10*3/uL (ref 150–450)
RBC: 3.75 x10E6/uL — ABNORMAL LOW (ref 3.77–5.28)
RDW: 15.8 % — ABNORMAL HIGH (ref 11.7–15.4)
WBC: 6.2 10*3/uL (ref 3.4–10.8)

## 2018-10-06 ENCOUNTER — Inpatient Hospital Stay: Payer: PPO | Admitting: Oncology

## 2018-10-14 NOTE — Progress Notes (Signed)
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  INTERVAL HISTORY: Patient returns to clinic today for routine 33-monthevaluation.  She currently feels well and is asymptomatic.  She is tolerating letrozole without significant side effects.  She was recently admitted to the hospital with GI bleed of unknown etiology.  She has no neurologic complaints. She denies any recent fevers or illnesses. She has a good appetite and denies weight loss.  She denies any chest pain, shortness of breath, cough, or hemoptysis.  She denies any nausea, vomiting, constipation, or diarrhea.  She denies any melena or hematochezia.  She has no urinary complaints.  Patient feels at her baseline offers no specific complaints today.  REVIEW OF SYSTEMS:   Review of Systems  Constitutional: Negative.  Negative for fever, malaise/fatigue and weight loss.  Respiratory: Negative.  Negative for cough and shortness of breath.   Cardiovascular: Negative.  Negative for chest pain and leg swelling.  Gastrointestinal: Negative.  Negative for abdominal pain and constipation.  Genitourinary: Negative.  Negative for dysuria.  Musculoskeletal: Negative.  Negative for back pain.  Skin: Negative.  Negative for rash.  Neurological: Negative.  Negative for dizziness, sensory change, focal weakness and weakness.  Psychiatric/Behavioral: Negative.  The  patient is not nervous/anxious.     As per HPI. Otherwise, a complete review of systems is negative.  PAST MEDICAL HISTORY: Past Medical History:  Diagnosis Date  . Breast cancer ([facility] 2018   Right breast  . [place] [place]   T1b, N0; ER/PR+, 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 Mucin noted on biopsy, not on wide excision.  . Coronary artery disease   . GERD (gastroesophageal reflux disease)   . Hyperlipidemia   . Hypertension   . Lichen planus 037/62/8315  Right breast in field of whole breast radiation. Dr. IKellie MoorDX by punch bioppsy  . Lichen planus    right breast   . Lichen planus   . [place] ([place]ocardial infarction) ([facility]    2015  . Personal history of radiation therapy 09/2016   RIGHT lumpectomy w/ radiation    PAST SURGICAL HISTORY: Past Surgical History:  Procedure Laterality Date  . ABDOMINAL HYSTERECTOMY    . APPENDECTOMY    . BREAST BIOPSY Right 07/23/2016   INVASIVE MAMMARY CARCINOMA WITH AREAS OF EXTRACELLULAR MUCIN  . BREAST EXCISIONAL BIOPSY     INVASIVE MUCINOUS MAMMARY CARCINOMA.   .Marland KitchenBREAST LUMPECTOMY Right 08/20/2016   INVASIVE MUCINOUS MAMMARY CARCINOMA. /Grade 2   . COLONOSCOPY  2016  . COLONOSCOPY N/A 09/06/2018   Procedure: COLONOSCOPY;  Surgeon: TVirgel Manifold MD;  Location: [facility] ENDOSCOPY;  Service: Endoscopy;  Laterality: N/A;  . CORONARY ANGIOPLASTY WITH STENT PLACEMENT    . CYSTOSCOPY    . ESOPHAGOGASTRODUODENOSCOPY N/A 09/06/2018   Procedure: ESOPHAGOGASTRODUODENOSCOPY (EGD);  Surgeon: TVirgel Manifold MD;  [facility];  Service: Endoscopy;  Laterality: N/A;  . OOPHORECTOMY    . PARTIAL MASTECTOMY WITH AXILLARY SENTINEL LYMPH NODE BIOPSY Right 08/09/2016  Procedure: PARTIAL MASTECTOMY WITH AXILLARY SENTINEL LYMPH NODE BIOPSY;  Surgeon: Robert Bellow, MD;  Location: [facility] ORS;  Service: General;  Laterality: Right;  . TONSILLECTOMY      FAMILY HISTORY: Family History  Problem Relation Age of Onset   . Hyperlipidemia Mother   . Allergies Mother   . Cerebral aneurysm Mother        cause of death at age 28  . Heart disease Father        Fatal MI ag 82  . Cancer Brother        lung cancer  . Hyperlipidemia Brother   . Colonic polyp Brother   . Healthy Son   . Cancer - Lung Brother        colon  . Healthy Son   . Breast cancer Neg Hx     ADVANCED DIRECTIVES (Y/N):  N  HEALTH MAINTENANCE: Social History   Tobacco Use  . Smoking status: Former Smoker    Packs/day: 0.25    Years: 10.00    Pack years: 2.50    Types: Cigarettes    Quit date: 03/06/1998    Years since quitting: 20.6  . Smokeless tobacco: Never Used  Substance Use Topics  . Alcohol use: No  . Drug use: No     Colonoscopy:  PAP:  Bone density:  Lipid panel:  Allergies  Allergen Reactions  . Celebrex  [Celecoxib]     Dark stools.  . Penicillins Other (See Comments)    Childhood reaction Has patient had a PCN reaction causing immediate rash, facial/tongue/throat swelling, SOB or lightheadedness with hypotension: Unknown Has patient had a PCN reaction causing severe rash involving mucus membranes or skin necrosis: Unknown Has patient had a PCN reaction that required hospitalization: Unknown Has patient had a PCN reaction occurring within the last 10 years: Unknown If all of the above answers are "NO", then may proceed with Cephalosporin use.   Marland Kitchen Prevacid [Lansoprazole] Diarrhea    Current Outpatient Medications  Medication Sig Dispense Refill  . letrozole (FEMARA) 2.5 MG tablet Take 2.5 mg by mouth daily.    Marland Kitchen lisinopril (ZESTRIL) 10 MG tablet Take 1 tablet (10 mg total) by mouth daily. 90 tablet 3   No current facility-administered medications for this visit.     OBJECTIVE: Vitals:   10/20/18 0956  BP: (!) 192/96  Pulse: 80  Temp: (!) 96.3 F (35.7 C)     Body mass index is 23.19 kg/m.    ECOG FS:0 - Asymptomatic  General: Well-developed, well-nourished, no acute distress. Eyes: Pink  conjunctiva, anicteric sclera. HEENT: Normocephalic, moist mucous membranes. Breast: Patient declined breast exam today. Lungs: Clear to auscultation bilaterally. Heart: Regular rate and rhythm. No rubs, murmurs, or gallops. Abdomen: Soft, nontender, nondistended. No organomegaly noted, normoactive bowel sounds. Musculoskeletal: No edema, cyanosis, or clubbing. Neuro: Alert, answering all questions appropriately. Cranial nerves grossly intact. Skin: No rashes or petechiae noted. Psych: Normal affect.  LAB RESULTS:  Lab Results  Component Value Date   NA 143 09/11/2018   K 3.9 09/11/2018   CL 114 (H) 09/11/2018   CO2 23 09/11/2018   GLUCOSE 94 09/11/2018   BUN 14 09/11/2018   CREATININE 0.84 09/11/2018   CALCIUM 8.0 (L) 09/11/2018   PROT 5.5 (L) 09/09/2018   ALBUMIN 3.2 (L) 09/09/2018   AST 19 09/09/2018   ALT 14 09/09/2018   ALKPHOS 33 (L) 09/09/2018   BILITOT 0.7 09/09/2018   GFRNONAA >60 09/11/2018   GFRAA >60 09/11/2018  Lab Results  Component Value Date   WBC 6.2 10/04/2018   NEUTROABS 4.5 10/04/2018   HGB 10.8 (L) 10/19/2018   HCT 33.9 (L) 10/04/2018   MCV 90 10/04/2018   PLT 270 10/04/2018     STUDIES: No results found.  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  PLAN:    1. 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 Given the size of the tumor, no Oncotype DX was ordered and no adjuvant chemotherapy was recommended. She completed adjuvant XRT in August 2018.  Continue letrozole for a total of 5 years completing in August 2023.  Patient's most recent mammogram on May 03, 2018 was reported as BI-RADS 2, repeat in February 2021.  Return to clinic in 6 months for routine evaluation.   2.  Osteopenia:  Bone mineral density completed on January 24, 2017 revealed a T score of -1.8.  Continue calcium and vitamin D supplementation.  Repeat in February 2021 along with her mammogram as above. 3.  GI bleed: Secondary to diverticular bleed.  Colonoscopy and upper endoscopy on September 06, 2018 did not reveal active bleed or other significant pathology.  Repeat CBC in 6 months.   Patient expressed understanding and was in agreement with this plan. She also understands that She can call clinic at any time with any questions, concerns, or complaints.   Cancer Staging Malignant neoplasm of upper-inner quadrant of right breast in female, estrogen receptor positive ([place]) Staging form: Breast, AJCC 8th Edition - Pathologic stage from 08/30/2016: Stage IA (pT1b, pN0, cM0, G2, ER+, PR+, HER2-) - Signed by Lloyd Huger, MD on 08/30/2016   Lloyd Huger, MD   10/20/2018 4:40 PM

## 2018-10-19 ENCOUNTER — Other Ambulatory Visit: Payer: Self-pay

## 2018-10-19 ENCOUNTER — Ambulatory Visit: Payer: PPO | Admitting: Gastroenterology

## 2018-10-19 ENCOUNTER — Encounter: Payer: Self-pay | Admitting: Family Medicine

## 2018-10-19 ENCOUNTER — Encounter (INDEPENDENT_AMBULATORY_CARE_PROVIDER_SITE_OTHER): Payer: Self-pay

## 2018-10-19 ENCOUNTER — Encounter: Payer: Self-pay | Admitting: Gastroenterology

## 2018-10-19 ENCOUNTER — Ambulatory Visit (INDEPENDENT_AMBULATORY_CARE_PROVIDER_SITE_OTHER): Payer: PPO | Admitting: Family Medicine

## 2018-10-19 VITALS — BP 172/90 | HR 76 | Temp 97.0°F | Ht 64.0 in | Wt 136.5 lb

## 2018-10-19 VITALS — BP 172/80 | Temp 98.5°F | Resp 16 | Wt 137.0 lb

## 2018-10-19 DIAGNOSIS — K921 Melena: Secondary | ICD-10-CM | POA: Diagnosis not present

## 2018-10-19 DIAGNOSIS — D649 Anemia, unspecified: Secondary | ICD-10-CM

## 2018-10-19 DIAGNOSIS — R5381 Other malaise: Secondary | ICD-10-CM

## 2018-10-19 DIAGNOSIS — R5383 Other fatigue: Secondary | ICD-10-CM

## 2018-10-19 DIAGNOSIS — K922 Gastrointestinal hemorrhage, unspecified: Secondary | ICD-10-CM | POA: Diagnosis not present

## 2018-10-19 NOTE — Patient Instructions (Signed)
Take Miralax every other day for constipation

## 2018-10-19 NOTE — Progress Notes (Signed)
Vonda Antigua, MD [address]  [place]  [place], [place] [address]  Main: [phone]  Fax: [phone]   Primary Care Physician: Jerrol Banana., MD   Chief Complaint  Patient presents with  . New Patient (Initial Visit)    Patient was seen in the hospital for lower GI bleed. Patient states she has had no bleeding since the hospital     HPI: [name] [name] is a 78 y.o. female here for post hospital follow up for hematochezia.  No further hematochezia since hospital discharge.  The patient denies abdominal or flank pain, anorexia, nausea or vomiting, dysphagia, change in bowel habits or black or bloody stools or weight loss.   Initially admitted on September 05, 2018 with hematochezia.  Underwent EGD and colonoscopy with colonoscopy showing old blood in the colon, suggestive of diverticular bleeding, with no active bleeding at the time of the procedure.  Fair prep, 2, 2-53mm polyps removed from the ascending colon, diverticulosis.  EGD showed a large medical hernia, otherwise normal.  Readmitted on July 6 with recurrent hematochezia and evaluated by Dr. Allen Norris.  Bleeding scan on this admission was negative for any active bleeding.  Bleeding had resolved, and no further endoscopic interventions were recommended.  Current Outpatient Medications  Medication Sig Dispense Refill  . Clobetasol Prop Emollient Base 0.05 % emollient cream as needed.     Marland Kitchen letrozole (FEMARA) 2.5 MG tablet Take 2.5 mg by mouth daily.    Marland Kitchen lisinopril (ZESTRIL) 10 MG tablet Take 1 tablet (10 mg total) by mouth daily. 90 tablet 3  . nystatin cream (MYCOSTATIN) Apply 1 application topically 2 (two) times daily. 30 g 0  . omeprazole (PRILOSEC) 20 MG capsule Take 1 capsule (20 mg total) by mouth daily as needed (indigestion). 30 capsule 5  . pravastatin (PRAVACHOL) 10 MG tablet Take 10 mg by mouth daily.     No current facility-administered medications for this visit.     Allergies as of  10/19/2018 - Review Complete 10/19/2018  Allergen Reaction Noted  . Celebrex  [celecoxib]  07/12/2014  . Penicillins Other (See Comments) 07/12/2014  . Prevacid [lansoprazole] Diarrhea 07/12/2014    ROS:  General: Negative for anorexia, weight loss, fever, chills, fatigue, weakness. ENT: Negative for hoarseness, difficulty swallowing , nasal congestion. CV: Negative for chest pain, angina, palpitations, dyspnea on exertion, peripheral edema.  Respiratory: Negative for dyspnea at rest, dyspnea on exertion, cough, sputum, wheezing.  GI: See history of present illness. GU:  Negative for dysuria, hematuria, urinary incontinence, urinary frequency, nocturnal urination.  Endo: Negative for unusual weight change.    Physical Examination:   BP (!) 172/90 (BP Location: Left Arm, Patient Position: Sitting, Cuff Size: Normal)   Pulse 76   Temp (!) 97 F (36.1 C)   Ht 5\' 4"  (1.626 m)   Wt 136 lb 8 oz (61.9 kg)   BMI 23.43 kg/m   General: Well-nourished, well-developed in no acute distress.  Eyes: No icterus. Conjunctivae pink. Mouth: Oropharyngeal mucosa moist and pink , no lesions erythema or exudate. Neck: Supple, Trachea midline Abdomen: Bowel sounds are normal, nontender, nondistended, no hepatosplenomegaly or masses, no abdominal bruits or hernia , no rebound or guarding.   Extremities: No lower extremity edema. No clubbing or deformities. Neuro: Alert and oriented x 3.  Grossly intact. Skin: Warm and dry, no jaundice.   Psych: Alert and cooperative, normal mood and affect.   Labs: CMP     Component Value Date/Time  NA 143 09/11/2018 0336   NA 144 04/25/2018 1155   NA 144 12/31/2013 0056   K 3.9 09/11/2018 0336   K 4.1 12/31/2013 0056   CL 114 (H) 09/11/2018 0336   CL 108 (H) 12/31/2013 0056   CO2 23 09/11/2018 0336   CO2 30 12/31/2013 0056   GLUCOSE 94 09/11/2018 0336   GLUCOSE 101 (H) 12/31/2013 0056   BUN 14 09/11/2018 0336   BUN 18 04/25/2018 1155   BUN 18  12/31/2013 0056   CREATININE 0.84 09/11/2018 0336   CREATININE 1.27 01/17/2014 1520   CALCIUM 8.0 (L) 09/11/2018 0336   CALCIUM 8.1 (L) 12/31/2013 0056   PROT 5.5 (L) 09/09/2018 2333   PROT 6.3 04/25/2018 1155   PROT 8.1 03/21/2012 1953   ALBUMIN 3.2 (L) 09/09/2018 2333   ALBUMIN 4.1 04/25/2018 1155   ALBUMIN 4.1 03/21/2012 1953   AST 19 09/09/2018 2333   AST 19 03/21/2012 1953   ALT 14 09/09/2018 2333   ALT 18 03/21/2012 1953   ALKPHOS 33 (L) 09/09/2018 2333   ALKPHOS 74 03/21/2012 1953   BILITOT 0.7 09/09/2018 2333   BILITOT 0.8 04/25/2018 1155   BILITOT 0.4 03/21/2012 1953   GFRNONAA >60 09/11/2018 0336   GFRNONAA 44 (L) 01/17/2014 1520   GFRNONAA 59 (L) 03/25/2012 0611   GFRAA >60 09/11/2018 0336   GFRAA 53 (L) 01/17/2014 1520   GFRAA >60 03/25/2012 0611   Lab Results  Component Value Date   WBC 6.2 10/04/2018   HGB 10.3 (L) 10/04/2018   HCT 33.9 (L) 10/04/2018   MCV 90 10/04/2018   PLT 270 10/04/2018    Imaging Studies: No results found.  Assessment and Plan:   DAYTON SHERR is a 78 y.o. y/o female for diverticular bleeding September 05, 2018, with repeat episode on July 6th 2020, neither of which required therapeutic interventions, and resolved at the time of colonoscopy done on September 06, 2018.  No further hematochezia patient reports soft bowel movements daily High-fiber diet encouraged Patient was asked to start MiraLAX every other day over-the-counter to maintain soft stool daily.   If this leads to diarrhea, she was asked to stop medication  We will recheck hemoglobin to ensure it is improving    Dr Vonda Antigua

## 2018-10-19 NOTE — Progress Notes (Signed)
Patient: [name] [name] Female    DOB: [birth year]   78 y.o.   MRN: [id number] Visit Date: 10/19/2018  Today's Provider: Wilhemena Durie, MD   Chief Complaint  Patient presents with  . Follow-up   Subjective:    HPI  Follow up for anemia  The patient was last seen for this 1 weeks ago. Changes made at last visit include take iron supplement.   She reports good compliance with treatment. She feels that condition is Unchanged. She is not having side effects.     Allergies  Allergen Reactions  . Celebrex  [Celecoxib]     Dark stools.  . Penicillins Other (See Comments)    Childhood reaction Has patient had a PCN reaction causing immediate rash, facial/tongue/throat swelling, SOB or lightheadedness with hypotension: Unknown Has patient had a PCN reaction causing severe rash involving mucus membranes or skin necrosis: Unknown Has patient had a PCN reaction that required hospitalization: Unknown Has patient had a PCN reaction occurring within the last 10 years: Unknown If all of the above answers are "NO", then may proceed with Cephalosporin use.   Marland Kitchen Prevacid [Lansoprazole] Diarrhea     Current Outpatient Medications:  .  Clobetasol Prop Emollient Base 0.05 % emollient cream, as needed. , Disp: , Rfl:  .  letrozole (FEMARA) 2.5 MG tablet, Take 2.5 mg by mouth daily., Disp: , Rfl:  .  lisinopril (ZESTRIL) 10 MG tablet, Take 1 tablet (10 mg total) by mouth daily., Disp: 90 tablet, Rfl: 3 .  nystatin cream (MYCOSTATIN), Apply 1 application topically 2 (two) times daily., Disp: 30 g, Rfl: 0 .  omeprazole (PRILOSEC) 20 MG capsule, Take 1 capsule (20 mg total) by mouth daily as needed (indigestion)., Disp: 30 capsule, Rfl: 5 .  pravastatin (PRAVACHOL) 10 MG tablet, Take 10 mg by mouth daily., Disp: , Rfl:   Review of Systems  Constitutional: Negative.  Negative for activity change and fatigue.  HENT: Negative.   Eyes: Negative.   Respiratory: Negative.  Negative  for cough and shortness of breath.   Cardiovascular: Negative.  Negative for chest pain, palpitations and leg swelling.  Gastrointestinal: Negative.   Endocrine: Negative.   Musculoskeletal: Negative.  Negative for arthralgias and myalgias.  Allergic/Immunologic: Negative.   Neurological: Negative.  Negative for dizziness and light-headedness.  Psychiatric/Behavioral: Negative.  Negative for agitation, self-injury, sleep disturbance and suicidal ideas. The patient is not nervous/anxious.     Social History   Tobacco Use  . Smoking status: Former Smoker    Packs/day: 0.25    Years: 10.00    Pack years: 2.50    Types: Cigarettes    Quit date: 03/06/1998    Years since quitting: 20.6  . Smokeless tobacco: Never Used  Substance Use Topics  . Alcohol use: No      Objective:   BP (!) 172/80   Temp 98.5 F (36.9 C)   Resp 16   Wt 137 lb (62.1 kg)   SpO2 98%   BMI 23.52 kg/m  Vitals:   10/19/18 1444  BP: (!) 172/80  Resp: 16  Temp: 98.5 F (36.9 C)  SpO2: 98%  Weight: 137 lb (62.1 kg)     Physical Exam Vitals signs reviewed.  Constitutional:      Appearance: She is well-developed.  HENT:     Head: Normocephalic and atraumatic.     Right Ear: External ear normal.     Left Ear: External ear normal.  Nose: Nose normal.  Eyes:     General: No scleral icterus.    Conjunctiva/sclera: Conjunctivae normal.  Neck:     Thyroid: No thyromegaly.  Cardiovascular:     Rate and Rhythm: Normal rate and regular rhythm.     Heart sounds: Normal heart sounds.  Pulmonary:     Effort: Pulmonary effort is normal.     Breath sounds: Normal breath sounds.  Abdominal:     Palpations: Abdomen is soft.  Musculoskeletal:     Right lower leg: No edema.     Left lower leg: No edema.  Skin:    General: Skin is warm and dry.  Neurological:     General: No focal deficit present.     Mental Status: She is alert and oriented to person, place, and time. Mental status is at baseline.   Psychiatric:        Mood and Affect: Mood normal.        Behavior: Behavior normal.        Thought Content: Thought content normal.        Judgment: Judgment normal.      No results found for any visits on 10/19/18.     Assessment & Plan    1. Lower GI bleeding Diverticular bleed has resolved.  2. Malaise and fatigue   3. Anemia, unspecified type Recheck CBC,iron.     Richard Cranford Mon, MD  [facility]

## 2018-10-20 ENCOUNTER — Other Ambulatory Visit: Payer: Self-pay

## 2018-10-20 ENCOUNTER — Inpatient Hospital Stay: Payer: PPO | Attending: Oncology | Admitting: Oncology

## 2018-10-20 VITALS — BP 192/96 | HR 80 | Temp 96.3°F | Wt 135.1 lb

## 2018-10-20 DIAGNOSIS — K219 Gastro-esophageal reflux disease without esophagitis: Secondary | ICD-10-CM | POA: Diagnosis not present

## 2018-10-20 DIAGNOSIS — Z79811 Long term (current) use of aromatase inhibitors: Secondary | ICD-10-CM | POA: Diagnosis not present

## 2018-10-20 DIAGNOSIS — C50211 Malignant neoplasm of upper-inner quadrant of right female breast: Secondary | ICD-10-CM

## 2018-10-20 DIAGNOSIS — M8580 Other specified disorders of bone density and structure, unspecified site: Secondary | ICD-10-CM | POA: Insufficient documentation

## 2018-10-20 DIAGNOSIS — I2510 Atherosclerotic heart disease of native coronary artery without angina pectoris: Secondary | ICD-10-CM | POA: Diagnosis not present

## 2018-10-20 DIAGNOSIS — E785 Hyperlipidemia, unspecified: Secondary | ICD-10-CM | POA: Insufficient documentation

## 2018-10-20 DIAGNOSIS — Z8719 Personal history of other diseases of the digestive system: Secondary | ICD-10-CM | POA: Insufficient documentation

## 2018-10-20 DIAGNOSIS — Z79899 Other long term (current) drug therapy: Secondary | ICD-10-CM | POA: Insufficient documentation

## 2018-10-20 DIAGNOSIS — I252 Old myocardial infarction: Secondary | ICD-10-CM | POA: Insufficient documentation

## 2018-10-20 DIAGNOSIS — I10 Essential (primary) hypertension: Secondary | ICD-10-CM | POA: Insufficient documentation

## 2018-10-20 DIAGNOSIS — Z923 Personal history of irradiation: Secondary | ICD-10-CM | POA: Diagnosis not present

## 2018-10-20 DIAGNOSIS — Z170 Estrogen receptor positive status [ER+]: Secondary | ICD-10-CM

## 2018-10-20 DIAGNOSIS — M719 Bursopathy, unspecified: Secondary | ICD-10-CM | POA: Insufficient documentation

## 2018-10-20 DIAGNOSIS — Z9221 Personal history of antineoplastic chemotherapy: Secondary | ICD-10-CM | POA: Insufficient documentation

## 2018-10-20 DIAGNOSIS — Z87891 Personal history of nicotine dependence: Secondary | ICD-10-CM | POA: Diagnosis not present

## 2018-10-20 LAB — HEMOGLOBIN: Hemoglobin: 10.8 g/dL — ABNORMAL LOW (ref 11.1–15.9)

## 2018-10-23 ENCOUNTER — Ambulatory Visit: Payer: Self-pay | Admitting: Family Medicine

## 2018-10-25 DIAGNOSIS — M1711 Unilateral primary osteoarthritis, right knee: Secondary | ICD-10-CM | POA: Diagnosis not present

## 2018-10-30 ENCOUNTER — Telehealth: Payer: Self-pay

## 2018-10-30 NOTE — Telephone Encounter (Signed)
-----   Message from Virgel Manifold, MD sent at 10/24/2018 11:57 AM EDT ----- Jackelyn Poling please let patient know, her hemoglobin is 10.8, which is continuing to improve.  If she has any more bleeding in the future she should go to the hospital again.  Please make sure she has follow-up with me in 6 months.

## 2018-10-30 NOTE — Telephone Encounter (Signed)
Called pt but no answer or voice mail. Will mail pt letter.

## 2018-11-17 ENCOUNTER — Ambulatory Visit: Payer: PPO | Admitting: Radiation Oncology

## 2018-11-22 ENCOUNTER — Ambulatory Visit (INDEPENDENT_AMBULATORY_CARE_PROVIDER_SITE_OTHER): Payer: PPO | Admitting: Family Medicine

## 2018-11-22 ENCOUNTER — Other Ambulatory Visit: Payer: Self-pay

## 2018-11-22 ENCOUNTER — Encounter: Payer: Self-pay | Admitting: Family Medicine

## 2018-11-22 VITALS — BP 196/88 | HR 78 | Temp 96.9°F | Resp 18 | Wt 139.2 lb

## 2018-11-22 DIAGNOSIS — I10 Essential (primary) hypertension: Secondary | ICD-10-CM

## 2018-11-22 DIAGNOSIS — R2681 Unsteadiness on feet: Secondary | ICD-10-CM

## 2018-11-22 DIAGNOSIS — H539 Unspecified visual disturbance: Secondary | ICD-10-CM

## 2018-11-22 DIAGNOSIS — E0590 Thyrotoxicosis, unspecified without thyrotoxic crisis or storm: Secondary | ICD-10-CM

## 2018-11-22 DIAGNOSIS — D649 Anemia, unspecified: Secondary | ICD-10-CM | POA: Diagnosis not present

## 2018-11-22 MED ORDER — LISINOPRIL 40 MG PO TABS: 40.0000 mg | ORAL_TABLET | Freq: Every day | ORAL | 3 refills | Status: DC

## 2018-11-22 NOTE — Progress Notes (Signed)
Patient: [name] [name] Female    DOB: [birth year]   78 y.o.   MRN: [id number] Visit Date: 11/22/2018  Today's Provider: Wilhemena Durie, MD   Chief Complaint  Patient presents with  . Follow-up   Subjective:     HPI 1 Month follow up for hemoglobin. Patient states that she experiencing problems with her balance, hand cramping that has gotten worse, and vision changes for the last few weeks. She states that she is unable to see when reading books while wearing her glasses. She needs eye exam. She is also a little unsteady with her gait.  Allergies  Allergen Reactions  . Celebrex  [Celecoxib]     Dark stools.  . Penicillins Other (See Comments)    Childhood reaction Has patient had a PCN reaction causing immediate rash, facial/tongue/throat swelling, SOB or lightheadedness with hypotension: Unknown Has patient had a PCN reaction causing severe rash involving mucus membranes or skin necrosis: Unknown Has patient had a PCN reaction that required hospitalization: Unknown Has patient had a PCN reaction occurring within the last 10 years: Unknown If all of the above answers are "NO", then may proceed with Cephalosporin use.   Marland Kitchen Prevacid [Lansoprazole] Diarrhea     Current Outpatient Medications:  .  letrozole (FEMARA) 2.5 MG tablet, Take 2.5 mg by mouth daily., Disp: , Rfl:  .  lisinopril (ZESTRIL) 10 MG tablet, Take 1 tablet (10 mg total) by mouth daily., Disp: 90 tablet, Rfl: 3  Review of Systems  Constitutional: Negative.   HENT: Negative.   Eyes: Positive for visual disturbance.  Respiratory: Negative.   Cardiovascular: Negative.   Gastrointestinal: Negative.   Musculoskeletal: Positive for gait problem.  Allergic/Immunologic: Negative.   Neurological: Positive for dizziness.  Psychiatric/Behavioral: Negative.     Social History   Tobacco Use  . Smoking status: Former Smoker    Packs/day: 0.25    Years: 10.00    Pack years: 2.50    Types: Cigarettes     Quit date: 03/06/1998    Years since quitting: 20.7  . Smokeless tobacco: Never Used  Substance Use Topics  . Alcohol use: No      Objective:   BP (!) 212/95 (BP Location: Right Arm, Patient Position: Sitting, Cuff Size: Normal)   Pulse 78   Temp (!) 96.9 F (36.1 C) (Temporal)   Resp 18   Wt 139 lb 3.2 oz (63.1 kg)   SpO2 99%   BMI 23.89 kg/m  Vitals:   11/22/18 1045  BP: (!) 212/95  Pulse: 78  Resp: 18  Temp: (!) 96.9 F (36.1 C)  TempSrc: Temporal  SpO2: 99%  Weight: 139 lb 3.2 oz (63.1 kg)  Body mass index is 23.89 kg/m.   Physical Exam Vitals signs reviewed.  Constitutional:      Appearance: She is well-developed.  HENT:     Head: Normocephalic and atraumatic.     Right Ear: External ear normal.     Left Ear: External ear normal.     Nose: Nose normal.  Eyes:     General: No scleral icterus.    Extraocular Movements: Extraocular movements intact.     Conjunctiva/sclera: Conjunctivae normal.     Pupils: Pupils are equal, round, and reactive to light.  Neck:     Thyroid: No thyromegaly.  Cardiovascular:     Rate and Rhythm: Normal rate and regular rhythm.     Heart sounds: Normal heart sounds.  Pulmonary:  Effort: Pulmonary effort is normal.     Breath sounds: Normal breath sounds.  Abdominal:     Palpations: Abdomen is soft.  Lymphadenopathy:     Cervical: No cervical adenopathy.  Skin:    General: Skin is warm and dry.  Neurological:     General: No focal deficit present.     Mental Status: She is alert and oriented to person, place, and time. Mental status is at baseline.     Comments: Antalgic gait.  Psychiatric:        Mood and Affect: Mood normal.        Behavior: Behavior normal.        Thought Content: Thought content normal.        Judgment: Judgment normal.      No results found for any visits on 11/22/18.     Assessment & Plan    1. Visual disturbances  - Ambulatory referral to Ophthalmology  2. Gait instability  Might need neuro referral. - Ambulatory referral to Physical Therapy  3. Anemia, unspecified type  - CBC w/Diff/Platelet - Fe+TIBC+Fer  4. Hyperthyroidism  - TSH  5. Essential (primary) hypertension Increase to 40mg . I think she will need CCB.RTC 1 month. More than 50% 25 minute visit spent in counseling or coordination of care  - lisinopril (ZESTRIL) 40 MG tablet; Take 1 tablet (40 mg total) by mouth daily.  Dispense: 90 tablet; Refill: 3 - Comp. Metabolic Panel (12)     Richard Cranford Mon, MD  [facility]

## 2018-11-23 LAB — CBC WITH DIFFERENTIAL/PLATELET
Basophils Absolute: 0.1 x10E3/uL (ref 0.0–0.2)
Basos: 1 %
EOS (ABSOLUTE): 0.1 x10E3/uL (ref 0.0–0.4)
Eos: 2 %
Hematocrit: 36.8 % (ref 34.0–46.6)
Hemoglobin: 11.8 g/dL (ref 11.1–15.9)
Immature Grans (Abs): 0 x10E3/uL (ref 0.0–0.1)
Immature Granulocytes: 0 %
Lymphocytes Absolute: 1.1 x10E3/uL (ref 0.7–3.1)
Lymphs: 19 %
MCH: 27.3 pg (ref 26.6–33.0)
MCHC: 32.1 g/dL (ref 31.5–35.7)
MCV: 85 fL (ref 79–97)
Monocytes Absolute: 0.4 x10E3/uL (ref 0.1–0.9)
Monocytes: 7 %
Neutrophils Absolute: 4.2 x10E3/uL (ref 1.4–7.0)
Neutrophils: 71 %
Platelets: 232 x10E3/uL (ref 150–450)
RBC: 4.33 x10E6/uL (ref 3.77–5.28)
RDW: 14.6 % (ref 11.7–15.4)
WBC: 6 x10E3/uL (ref 3.4–10.8)

## 2018-11-23 LAB — COMP. METABOLIC PANEL (12)
AST: 18 IU/L (ref 0–40)
Albumin/Globulin Ratio: 2.1 (ref 1.2–2.2)
Albumin: 4.2 g/dL (ref 3.7–4.7)
Alkaline Phosphatase: 68 IU/L (ref 39–117)
BUN/Creatinine Ratio: 21 (ref 12–28)
BUN: 18 mg/dL (ref 8–27)
Bilirubin Total: 0.6 mg/dL (ref 0.0–1.2)
Calcium: 9.7 mg/dL (ref 8.7–10.3)
Chloride: 108 mmol/L — ABNORMAL HIGH (ref 96–106)
Creatinine, Ser: 0.84 mg/dL (ref 0.57–1.00)
GFR calc Af Amer: 76 mL/min/{1.73_m2} (ref >59)
GFR calc non Af Amer: 66 mL/min/{1.73_m2} (ref >59)
Globulin, Total: 2 g/dL (ref 1.5–4.5)
Glucose: 82 mg/dL (ref 65–99)
Potassium: 4.3 mmol/L (ref 3.5–5.2)
Sodium: 147 mmol/L — ABNORMAL HIGH (ref 134–144)
Total Protein: 6.2 g/dL (ref 6.0–8.5)

## 2018-11-23 LAB — TSH: TSH: 1.11 u[IU]/mL (ref 0.450–4.500)

## 2018-11-23 LAB — IRON,TIBC AND FERRITIN PANEL
Ferritin: 17 ng/mL (ref 15–150)
Iron Saturation: 13 % — ABNORMAL LOW (ref 15–55)
Iron: 53 ug/dL (ref 27–139)
Total Iron Binding Capacity: 424 ug/dL (ref 250–450)
UIBC: 371 ug/dL — ABNORMAL HIGH (ref 118–369)

## 2018-11-24 ENCOUNTER — Telehealth: Payer: Self-pay | Admitting: Family Medicine

## 2018-11-24 NOTE — Telephone Encounter (Signed)
Hope with [facility] called wanting to know what the patients Lisinopril dose should be.  They had it filled on 8/29 to a local pharmacy by a different dr.  [phone]  [place]

## 2018-11-24 NOTE — Telephone Encounter (Signed)
Spoke with [facility] for clarification on correct dosage.

## 2018-11-27 ENCOUNTER — Ambulatory Visit: Payer: Self-pay | Admitting: Family Medicine

## 2018-11-28 DIAGNOSIS — H5052 Exophoria: Secondary | ICD-10-CM | POA: Diagnosis not present

## 2018-11-29 ENCOUNTER — Ambulatory Visit: Payer: PPO | Attending: Family Medicine

## 2018-11-29 ENCOUNTER — Other Ambulatory Visit: Payer: Self-pay

## 2018-11-29 DIAGNOSIS — M6281 Muscle weakness (generalized): Secondary | ICD-10-CM | POA: Diagnosis not present

## 2018-11-29 DIAGNOSIS — M25561 Pain in right knee: Secondary | ICD-10-CM | POA: Insufficient documentation

## 2018-11-29 DIAGNOSIS — R2681 Unsteadiness on feet: Secondary | ICD-10-CM | POA: Diagnosis not present

## 2018-11-29 DIAGNOSIS — G8929 Other chronic pain: Secondary | ICD-10-CM | POA: Insufficient documentation

## 2018-11-29 DIAGNOSIS — R262 Difficulty in walking, not elsewhere classified: Secondary | ICD-10-CM | POA: Insufficient documentation

## 2018-11-29 NOTE — Therapy (Signed)
[facility] [address], [place], [address] Phone: [phone]   Fax:  [phone]  Physical Therapy Evaluation  Patient Details  Name: [name] [name] MRN: [id number] Date of Birth: 07/10/1940 Referring Provider (PT): Miguel Aschoff, MD   Encounter Date: 11/29/2018  PT End of Session - 11/29/18 1024    Visit Number  1    Number of Visits  17    Date for PT Re-Evaluation  01/25/19    Authorization Type  1    Authorization Time Period  of 10 Medicare progress report    PT Start Time  1025    PT Stop Time  1121    PT Time Calculation (min)  56 min    Equipment Utilized During Treatment  Gait belt    Activity Tolerance  Patient tolerated treatment well    Behavior During Therapy  WFL for tasks assessed/performed       Past Medical History:  Diagnosis Date  . Breast cancer ([place]) 2018   Right breast  . 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 Mucin noted on biopsy, not on wide excision.  . Coronary artery disease   . GERD (gastroesophageal reflux disease)   . Hyperlipidemia   . Hypertension   . 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 Dr. Kellie Moor DX by punch bioppsy  . Lichen planus    right breast   . Lichen planus   . MI (myocardial infarction) ([place])    2015  . Personal history of radiation therapy 09/2016   RIGHT lumpectomy w/ radiation    Past Surgical History:  Procedure Laterality Date  . ABDOMINAL HYSTERECTOMY    . APPENDECTOMY    . BREAST BIOPSY Right 07/23/2016   INVASIVE MAMMARY CARCINOMA WITH AREAS OF EXTRACELLULAR MUCIN  . BREAST EXCISIONAL BIOPSY     INVASIVE MUCINOUS MAMMARY CARCINOMA.   Marland Kitchen BREAST LUMPECTOMY Right 08/20/2016   INVASIVE MUCINOUS MAMMARY CARCINOMA. /Grade 2   . COLONOSCOPY  2016  . COLONOSCOPY N/A 09/06/2018   Procedure: COLONOSCOPY;  Surgeon: Virgel Manifold, MD;   Location: [facility] ENDOSCOPY;  Service: Endoscopy;  Laterality: N/A;  . CORONARY ANGIOPLASTY WITH STENT PLACEMENT    . CYSTOSCOPY    . ESOPHAGOGASTRODUODENOSCOPY N/A 09/06/2018   Procedure: ESOPHAGOGASTRODUODENOSCOPY (EGD);  Surgeon: Virgel Manifold, MD;  Location: [facility] ENDOSCOPY;  Service: Endoscopy;  Laterality: N/A;  . OOPHORECTOMY    . PARTIAL MASTECTOMY WITH AXILLARY SENTINEL LYMPH NODE BIOPSY Right 08/09/2016   Procedure: PARTIAL MASTECTOMY WITH AXILLARY SENTINEL LYMPH NODE BIOPSY;  Surgeon: Robert Bellow, MD;  Location: [facility] ORS;  Service: General;  Laterality: Right;  . TONSILLECTOMY      There were no vitals filed for this visit.   Subjective Assessment - 11/29/18 1037    Subjective  R knee pain: 10/10 at worst for the past 3 months.    Pertinent History  Gait instability. Balance has been off for about 6-7 months. Unknown onset. Her PCP sent her to a neurologist who did not find anything. As far of neurologist is concerned, he could not find anything. The doctor called her condition a certain name but pt could not remember. Pt also went to an eye doctor yesterday who modified her glasses to see if it helps her with her balance. Pt usually loses her balance when she is walking. Pt gets a dizzy spell when she  is walking which just happens. Pt does not feel like she is going to black out, the room does not feel like it is turning. Pt just feels off balance but has difficulty describing it. Denies neck, back pain or UE and LE paresthesias. No falls within the last year. Started using a SPC the last couple of weaks due to fear of falling.    Patient Stated Goals  Want to get her balance better.    Currently in Pain?  No/denies    Pain Score  0-No pain         [facility] PT Assessment - 11/29/18 1022      Assessment   Medical Diagnosis  Gait instability    Referring Provider (PT)  Miguel Aschoff, MD    Onset Date/Surgical Date  11/22/18   Date PT referral signed. Symptoms began 6-7  months ago   Prior Therapy  No known PT for current condition      Precautions   Precaution Comments  Fall risk      Restrictions   Other Position/Activity Restrictions  No known weight bearing restrictions      Balance Screen   Has the patient fallen in the past 6 months  No    Has the patient had a decrease in activity level because of a fear of falling?   No   Has a fear of falling   Is the patient reluctant to leave their home because of a fear of falling?   No      Prior Function   Vocation  Retired    Biomedical scientist  PLOF: able to ambulate steadily      Observation/Other Assessments   Observations  B genu valgus with sit <> stand and gait.       Posture/Postural Control   Posture Comments  forward flexed, R side bend, protracted neck and B shoulders, R lateral shift, R iliac crest, hip, knee lower, R foot pronation, B genu valgus, R > L, slight unsteadiness      AROM   Overall AROM Comments  No dizziness with cervical AROM all planes and with overpressure    Lumbar Flexion  WFL    Lumbar Extension  Limited, R lateral knee joint pain    Lumbar - Right Side Bend  WFL    Lumbar - Left Side Bend  WFL    Lumbar - Right Rotation  WFL    Lumbar - Left Rotation  slightly limited      Strength   Right Hip Flexion  4-/5    Right Hip Extension  3+/5   seated manually resisted   Right Hip ABduction  4/5   seated manually resisted clamshell isometrics   Left Hip Flexion  4/5    Left Hip Extension  3+/5   seated manually resisted   Left Hip ABduction  4/5   seated manually resisted clamshell isometrics   Right Knee Flexion  5/5    Right Knee Extension  5/5    Left Knee Flexion  5/5    Left Knee Extension  5/5      Ambulation/Gait   Gait Comments  SPC on R, pt tripped on SPC x 1. B genu valgus, slow, slight insteadiness.  Antalgic, decreased stance R LE.      Dynamic Gait Index   Level Surface  Mild Impairment    Change in Gait Speed  Mild Impairment    Gait with  Horizontal Head Turns  Mild Impairment  Gait with Vertical Head Turns  Mild Impairment    Gait and Pivot Turn  Mild Impairment    Step Over Obstacle  Moderate Impairment    Step Around Obstacles  Normal    Steps  Moderate Impairment    Total Score  15    DGI comment:  < 19 suggests increased fall risk.    No AD used               Objective measurements completed on examination: See above findings.      No latex band allergies  Had CA of R breast. CA is gone  Blood pressure, L arm sitting, mechanically taken: 181/83, HR 78  after subjective: 170/76, HR 75   10/10 R knee pain at most for the past 3 months    No SPC with DGI   Antalgic gait, decreased stance R LE    Patient is an 78 year old female who came to physical therapy secondary to gait instability. She also demonstrates R knee pain, altered gait pattern and posture, decreased B femoral control, bilateral hip weakness, and difficulty performing functional tasks such as ambulation. Pt will benefit from skilled physical therapy services to address the aforementioned deficits.      PT Education - 11/29/18 1740    Education Details  plan of care    Person(s) Educated  Patient    Methods  Explanation    Comprehension  Verbalized understanding              PT Short Term Goals - 11/29/18 1305      PT SHORT TERM GOAL #1   Title  Pt will be independent with her HEP to improve LE strength, function, balance, and ability to ambulate.    Time  3    Period  Weeks    Status  New    Target Date  12/21/18          PT Long Term Goals - 11/29/18 1307      PT LONG TERM GOAL #1   Title  Patient will improve bilateral hip extension and abduction strength by at least 1/2 MMT grade to promote ability to ambulate more steadiliy and with less difficulty as well as perform transfers with less difficulty.    Time  8    Period  Weeks    Status  New    Target Date  01/25/19      PT LONG TERM GOAL #2    Title  Pt will improve her DGI score to 19 or more as a demonstration of improved balance and decrease fall risk.    Baseline  15 without use of AD (11/29/2018)    Time  8    Period  Weeks    Status  New    Target Date  01/25/19      PT LONG TERM GOAL #3   Title  Patient will have a decrease in R knee pain to 5/10 or less at worst to promote ability to ambulate more comfortably and decrease fall risk.    Baseline  10/10 R knee pain at most for the past 3 months (11/29/2018)    Time  8    Period  Weeks    Status  New    Target Date  01/25/19        Plan - 11/29/18 1258    Clinical Impression Statement  Patient is an 78 year old female who came to physical therapy secondary to gait  instability. She also demonstrates R knee pain, altered gait pattern and posture, decreased B femoral control, bilateral hip weakness, and difficulty performing functional tasks such as ambulation. Pt will benefit from skilled physical therapy services to address the aforementioned deficits.    Personal Factors and Comorbidities  Age;Comorbidity 1    Comorbidities  hx of CA    Examination-Activity Limitations  Locomotion Level;Transfers;Carry;Stairs    Stability/Clinical Decision Making  Evolving/Moderate complexity    Clinical Decision Making  Moderate   balance seems to be worsening based on pt interview   Rehab Potential  Fair    PT Frequency  2x / week    PT Duration  8 weeks    PT Treatment/Interventions  Therapeutic exercise;Balance training;Neuromuscular re-education;Patient/family education;Therapeutic activities;Gait training;Stair training;Electrical Stimulation;Iontophoresis 4mg /ml Dexamethasone;Canalith Repostioning;Aquatic Therapy;Manual techniques;Dry needling    PT Next Visit Plan  hip and trunk strengthening, femoral control, balance, manual techniques, modalities PRN    Consulted and Agree with Plan of Care  Patient       Patient will benefit from skilled therapeutic intervention in order to  improve the following deficits and impairments:  Pain, Improper body mechanics, Postural dysfunction, Abnormal gait, Decreased balance, Decreased activity tolerance, Decreased endurance, Difficulty walking, Decreased strength, Dizziness  Visit Diagnosis: Unsteadiness on feet - Plan: PT plan of care cert/re-cert  Difficulty in walking, not elsewhere classified - Plan: PT plan of care cert/re-cert  Muscle weakness (generalized) - Plan: PT plan of care cert/re-cert  Chronic pain of right knee - Plan: PT plan of care cert/re-cert     Problem List Patient Active Problem List   Diagnosis Date Noted  . Disorder of bursae of shoulder region 10/20/2018  . Lower GI bleeding 09/10/2018  . Hiatal hernia   . Melena   . Polyp of ascending colon   . Diverticulosis of large intestine without diverticulitis   . GIB (gastrointestinal bleeding) 09/05/2018  . LVH (left ventricular hypertrophy) due to hypertensive disease, without heart failure 04/10/2018  . Chest pain 03/31/2018  . Vasovagal syncope 03/20/2018  . Lichen planus [id number]  . Malignant neoplasm of upper-inner quadrant of right breast in female, estrogen receptor positive ([place]) 07/28/2016  . Gastric ulcer requiring drug therapy, chronic 01/23/2016  . MI (mitral incompetence) 07/22/2015  . Combined fat and carbohydrate induced hyperlipemia 12/26/2014  . UTI (lower urinary tract infection) 08/18/2014  . Blurry vision 08/18/2014  . Allergic rhinitis 07/12/2014  . Absolute anemia 07/12/2014  . Baker's cyst of knee 07/12/2014  . Atherosclerosis of coronary artery 07/12/2014  . CAFL (chronic airflow limitation) ([place]) 07/12/2014  . Dizziness 07/12/2014  . Essential (primary) hypertension 07/12/2014  . Acid reflux 07/12/2014  . 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 History of colon polyps 07/12/2014  . Hypercholesteremia 07/12/2014  . Malaise and fatigue 07/12/2014  . Heart attack ([place]) 07/12/2014  . Muscle ache 07/12/2014  . Arthritis,  degenerative 07/12/2014  . Bradycardia 08/23/2013  . Arteriosclerosis of coronary artery 08/17/2013  . Diabetes ([place]) 08/17/2013  . Diabetes mellitus ([place]) 08/17/2013  . Peripheral vascular disease ([place]) 08/17/2013    Joneen Boers PT, DPT   11/29/2018, 5:45 PM  [facility] [address] [place], [address] Phone: [phone]   Fax:  [phone]  Name: KORAIMA BUMFORD MRN: [id number] Date of Birth: 1940-10-02

## 2018-12-05 ENCOUNTER — Other Ambulatory Visit: Payer: Self-pay

## 2018-12-05 ENCOUNTER — Ambulatory Visit: Payer: PPO

## 2018-12-05 DIAGNOSIS — R2681 Unsteadiness on feet: Secondary | ICD-10-CM

## 2018-12-05 DIAGNOSIS — G8929 Other chronic pain: Secondary | ICD-10-CM

## 2018-12-05 DIAGNOSIS — M6281 Muscle weakness (generalized): Secondary | ICD-10-CM

## 2018-12-05 DIAGNOSIS — R262 Difficulty in walking, not elsewhere classified: Secondary | ICD-10-CM

## 2018-12-05 NOTE — Therapy (Signed)
[facility] [address], [place], [address] Phone: [phone]   Fax:  [phone]  Physical Therapy Treatment  Patient Details  Name: [name] [name] MRN: [id number] Date of Birth: 1940-03-29  Referring Provider (PT):  Aschoff, MD   Encounter Date: 12/05/2018  PT End of Session - 12/05/18 0908    Visit Number  2    Number of Visits  17    Date for PT Re-Evaluation  01/25/19    Authorization Type  2    Authorization Time Period  of 10 Medicare progress report    PT Start Time  0906    PT Stop Time  0947    PT Time Calculation (min)  41 min    Equipment Utilized During Treatment  Gait belt    Activity Tolerance  Patient tolerated treatment well    Behavior During Therapy  [facility] for tasks assessed/performed       Past Medical History:  Diagnosis Date  . Breast cancer ([place]) 2018   Right breast  . 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 Mucin noted on biopsy, not on wide excision.  . Coronary artery disease   . GERD (gastroesophageal reflux disease)   . Hyperlipidemia   . Hypertension   . 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 Dr. Kellie Moor DX by punch bioppsy  . Lichen planus    right breast   . Lichen planus   . MI (myocardial infarction) ([place])    2015  . Personal history of radiation therapy 09/2016   RIGHT lumpectomy w/ radiation    Past Surgical History:  Procedure Laterality Date  . ABDOMINAL HYSTERECTOMY    . APPENDECTOMY    . BREAST BIOPSY Right 07/23/2016   INVASIVE MAMMARY CARCINOMA WITH AREAS OF EXTRACELLULAR MUCIN  . BREAST EXCISIONAL BIOPSY     INVASIVE MUCINOUS MAMMARY CARCINOMA.   Marland Kitchen BREAST LUMPECTOMY Right 08/20/2016   INVASIVE MUCINOUS MAMMARY CARCINOMA. /Grade 2   . COLONOSCOPY  2016  . COLONOSCOPY N/A 09/06/2018   Procedure: COLONOSCOPY;  Surgeon: Virgel Manifold, MD;   Location: [facility] ENDOSCOPY;  Service: Endoscopy;  Laterality: N/A;  . CORONARY ANGIOPLASTY WITH STENT PLACEMENT    . CYSTOSCOPY    . ESOPHAGOGASTRODUODENOSCOPY N/A 09/06/2018   Procedure: ESOPHAGOGASTRODUODENOSCOPY (EGD);  Surgeon: Virgel Manifold, MD;  Location: [facility] ENDOSCOPY;  Service: Endoscopy;  Laterality: N/A;  . OOPHORECTOMY    . PARTIAL MASTECTOMY WITH AXILLARY SENTINEL LYMPH NODE BIOPSY Right 08/09/2016   Procedure: PARTIAL MASTECTOMY WITH AXILLARY SENTINEL LYMPH NODE BIOPSY;  Surgeon: Robert Bellow, MD;  Location: [facility] ORS;  Service: General;  Laterality: Right;  . TONSILLECTOMY      There were no vitals filed for this visit.  Subjective Assessment - 12/05/18 0908    Subjective  R leg feels like it wants to give out at any time. Just a little pain in R knee.    Pertinent History  Gait instability. Balance has been off for about 6-7 months. Unknown onset. Her PCP sent her to a neurologist who did not find anything. As far of neurologist is concerned, he could not find anything. The doctor called her condition a certain name but pt could not remember. Pt also went to an eye doctor yesterday who modified her glasses to see if it helps her with her balance. Pt usually loses her balance when she is  walking. Pt gets a dizzy spell when she is walking which just happens. Pt does not feel like she is going to black out, the room does not feel like it is turning. Pt just feels off balance but has difficulty describing it. Denies neck, back pain or UE and LE paresthesias. No falls within the last year. Started using a SPC the last couple of weaks due to fear of falling.    Patient Stated Goals  Want to get her balance better.    Currently in Pain?  Yes    Pain Score  5     Pain Location  Knee    Pain Orientation  Right                               PT Education - 12/05/18 1312    Education Details  ther-ex    Person(s) Educated  Patient    Methods   Explanation;Demonstration;Tactile cues;Verbal cues    Comprehension  Returned demonstration;Verbalized understanding      Objectives  No latex band allergies   Manual therapy  Seated STM R latearl hamstrings, IT band, Vastus lateralis  No R knee pain with gait with SPC afterwards  "Feels better" per pt   Therapeutic exercise  Blood pressure L arm sitting, mechanically taken, normal cuff: 151/73, HR 75  Seated, hips, less than 90 degrees flexion, clamshells resisting yellow band 10x3  Seated hip adduction glute and ball squeeze 10x5 seconds for 3 sets  Seated R hip extension isometrics 10x5 seconds for 3 sets  Sit <> stand from regular chair with arms, emphasis on femoral control 5x    Improved exercise technique, movement at target joints, use of target muscles after mod verbal, visual, tactile cues.    Response to treatment Good muscle use felt with exercises. Pt tolerated session well without aggravation of symptoms   Clinical impression  Pt demonstrates B hip and quadriceps weakness with decreased B femoral control. Worked on decreasing B genu valgus with sit <> stand to help decrease medial stress to R knee and decrease R knee pain. Also worked on bilateral glute med, and R glute max strengthening to promote ability to have femoral control with closed chain activities and decrease stress to both knees to decrease difficulty with gait. Pt tolerated session well without aggravation of symptoms. Pt will benefit from continued skilled physical therapy services to decrease pain, improve strength and function.       PT Short Term Goals - 11/29/18 1305      PT SHORT TERM GOAL #1   Title  Pt will be independent with her HEP to improve LE strength, function, balance, and ability to ambulate.    Time  3    Period  Weeks    Status  New    Target Date  12/21/18        PT Long Term Goals - 11/29/18 1307      PT LONG TERM GOAL #1   Title  Patient will improve bilateral  hip extension and abduction strength by at least 1/2 MMT grade to promote ability to ambulate more steadiliy and with less difficulty as well as perform transfers with less difficulty.    Time  8    Period  Weeks    Status  New    Target Date  01/25/19      PT LONG TERM GOAL #2   Title  Pt will improve  her DGI score to 19 or more as a demonstration of improved balance and decrease fall risk.    Baseline  15 without use of AD (11/29/2018)    Time  8    Period  Weeks    Status  New    Target Date  01/25/19      PT LONG TERM GOAL #3   Title  Patient will have a decrease in R knee pain to 5/10 or less at worst to promote ability to ambulate more comfortably and decrease fall risk.    Baseline  10/10 R knee pain at most for the past 3 months (11/29/2018)    Time  8    Period  Weeks    Status  New    Target Date  01/25/19            Plan - 12/05/18 1311    Clinical Impression Statement  Pt demonstrates B hip and quadriceps weakness with decreased B femoral control. Worked on decreasing B genu valgus with sit <> stand to help decrease medial stress to R knee and decrease R knee pain. Also worked on bilateral glute med, and R glute max strengthening to promote ability to have femoral control with closed chain activities and decrease stress to both knees to decrease difficulty with gait. Pt tolerated session well without aggravation of symptoms. Pt will benefit from continued skilled physical therapy services to decrease pain, improve strength and function.    Personal Factors and Comorbidities  Age;Comorbidity 1    Comorbidities  hx of CA    Examination-Activity Limitations  Locomotion Level;Transfers;Carry;Stairs    Stability/Clinical Decision Making  Evolving/Moderate complexity    Rehab Potential  Fair    PT Frequency  2x / week    PT Duration  8 weeks    PT Treatment/Interventions  Therapeutic exercise;Balance training;Neuromuscular re-education;Patient/family education;Therapeutic  activities;Gait training;Stair training;Electrical Stimulation;Iontophoresis 4mg /ml Dexamethasone;Canalith Repostioning;Aquatic Therapy;Manual techniques;Dry needling    PT Next Visit Plan  hip and trunk strengthening, femoral control, balance, manual techniques, modalities PRN    Consulted and Agree with Plan of Care  Patient       Patient will benefit from skilled therapeutic intervention in order to improve the following deficits and impairments:  Pain, Improper body mechanics, Postural dysfunction, Abnormal gait, Decreased balance, Decreased activity tolerance, Decreased endurance, Difficulty walking, Decreased strength, Dizziness  Visit Diagnosis: Muscle weakness (generalized)  Unsteadiness on feet  Chronic pain of right knee  Difficulty in walking, not elsewhere classified     Problem List Patient Active Problem List   Diagnosis Date Noted  . Disorder of bursae of shoulder region 10/20/2018  . Lower GI bleeding 09/10/2018  . Hiatal hernia   . Melena   . Polyp of ascending colon   . Diverticulosis of large intestine without diverticulitis   . GIB (gastrointestinal bleeding) 09/05/2018  . LVH (left ventricular hypertrophy) due to hypertensive disease, without heart failure 04/10/2018  . Chest pain 03/31/2018  . Vasovagal syncope 03/20/2018  . Lichen planus [id number]  . Malignant neoplasm of upper-inner quadrant of right breast in female, estrogen receptor positive ([place]) 07/28/2016  . Gastric ulcer requiring drug therapy, chronic 01/23/2016  . MI (mitral incompetence) 07/22/2015  . Combined fat and carbohydrate induced hyperlipemia 12/26/2014  . UTI (lower urinary tract infection) 08/18/2014  . Blurry vision 08/18/2014  . Allergic rhinitis 07/12/2014  . Absolute anemia 07/12/2014  . Baker's cyst of knee 07/12/2014  . Atherosclerosis of coronary artery 07/12/2014  . CAFL (chronic airflow  limitation) ([place]) 07/12/2014  . Dizziness 07/12/2014  . Essential (primary)  hypertension 07/12/2014  . Acid reflux 07/12/2014  . 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 History of colon polyps 07/12/2014  . Hypercholesteremia 07/12/2014  . Malaise and fatigue 07/12/2014  . Heart attack ([place]) 07/12/2014  . Muscle ache 07/12/2014  . Arthritis, degenerative 07/12/2014  . Bradycardia 08/23/2013  . Arteriosclerosis of coronary artery 08/17/2013  . Diabetes ([place]) 08/17/2013  . Diabetes mellitus ([place]) 08/17/2013  . Peripheral vascular disease ([place]) 08/17/2013   Joneen Boers PT, DPT   12/05/2018, 1:22 PM  [facility] [address], [place], [address] Phone: [phone]   Fax:  [phone]  Name: [name] [name] MRN: [id number] Date of Birth: 04/26/1940

## 2018-12-11 ENCOUNTER — Ambulatory Visit: Payer: PPO | Attending: Family Medicine

## 2018-12-11 ENCOUNTER — Telehealth: Payer: Self-pay

## 2018-12-11 DIAGNOSIS — M6281 Muscle weakness (generalized): Secondary | ICD-10-CM | POA: Insufficient documentation

## 2018-12-11 DIAGNOSIS — R2681 Unsteadiness on feet: Secondary | ICD-10-CM | POA: Insufficient documentation

## 2018-12-11 DIAGNOSIS — R262 Difficulty in walking, not elsewhere classified: Secondary | ICD-10-CM | POA: Insufficient documentation

## 2018-12-11 DIAGNOSIS — M25561 Pain in right knee: Secondary | ICD-10-CM | POA: Insufficient documentation

## 2018-12-11 DIAGNOSIS — G8929 Other chronic pain: Secondary | ICD-10-CM | POA: Insufficient documentation

## 2018-12-11 NOTE — Telephone Encounter (Signed)
No show. Called patient who said that she thought her appointment was for tomorrow. Informed pt of her next appointment for 12/13/2018 at 9:45 am and pt said that she will be able to make it.

## 2018-12-13 ENCOUNTER — Other Ambulatory Visit: Payer: Self-pay

## 2018-12-13 ENCOUNTER — Ambulatory Visit: Payer: PPO

## 2018-12-13 DIAGNOSIS — R262 Difficulty in walking, not elsewhere classified: Secondary | ICD-10-CM

## 2018-12-13 DIAGNOSIS — R2681 Unsteadiness on feet: Secondary | ICD-10-CM | POA: Diagnosis not present

## 2018-12-13 DIAGNOSIS — M25561 Pain in right knee: Secondary | ICD-10-CM | POA: Diagnosis not present

## 2018-12-13 DIAGNOSIS — G8929 Other chronic pain: Secondary | ICD-10-CM | POA: Diagnosis not present

## 2018-12-13 DIAGNOSIS — M6281 Muscle weakness (generalized): Secondary | ICD-10-CM | POA: Diagnosis not present

## 2018-12-13 NOTE — Patient Instructions (Signed)
Pt was recommended to keep her knees from turning in whenever she stands up and sits down onto a surface as well as to not twist as she sits to decrease stress to her knees. Pt verbalized understanding.

## 2018-12-13 NOTE — Therapy (Signed)
[facility] [address] [place], [address] Phone: [phone]   Fax:  [phone]  Physical Therapy Treatment  Patient Details  Name: [name] [name] MRN: [id number] Date of Birth: 06-17-1940 Referring Provider (PT):  Aschoff, MD   Encounter Date: 12/13/2018  PT End of Session - 12/13/18 0950    Visit Number  3    Number of Visits  17    Date for PT Re-Evaluation  01/25/19    Authorization Type  3    Authorization Time Period  of 10 Medicare progress report    PT Start Time  0950    PT Stop Time  1030    PT Time Calculation (min)  40 min    Equipment Utilized During Treatment  Gait belt    Activity Tolerance  Patient tolerated treatment well    Behavior During Therapy  [facility] for tasks assessed/performed       Past Medical History:  Diagnosis Date  . Breast cancer ([place]) 2018   Right breast  . 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 Mucin noted on biopsy, not on wide excision.  . Coronary artery disease   . GERD (gastroesophageal reflux disease)   . Hyperlipidemia   . Hypertension   . 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 Dr. Kellie Moor DX by punch bioppsy  . Lichen planus    right breast   . Lichen planus   . MI (myocardial infarction) ([place])    2015  . Personal history of radiation therapy 09/2016   RIGHT lumpectomy w/ radiation    Past Surgical History:  Procedure Laterality Date  . ABDOMINAL HYSTERECTOMY    . APPENDECTOMY    . BREAST BIOPSY Right 07/23/2016   INVASIVE MAMMARY CARCINOMA WITH AREAS OF EXTRACELLULAR MUCIN  . BREAST EXCISIONAL BIOPSY     INVASIVE MUCINOUS MAMMARY CARCINOMA.   Marland Kitchen BREAST LUMPECTOMY Right 08/20/2016   INVASIVE MUCINOUS MAMMARY CARCINOMA. /Grade 2   . COLONOSCOPY  2016  . COLONOSCOPY N/A 09/06/2018   Procedure: COLONOSCOPY;  Surgeon: Virgel Manifold, MD;   Location: [facility] ENDOSCOPY;  Service: Endoscopy;  Laterality: N/A;  . CORONARY ANGIOPLASTY WITH STENT PLACEMENT    . CYSTOSCOPY    . ESOPHAGOGASTRODUODENOSCOPY N/A 09/06/2018   Procedure: ESOPHAGOGASTRODUODENOSCOPY (EGD);  Surgeon: Virgel Manifold, MD;  Location: [facility] ENDOSCOPY;  Service: Endoscopy;  Laterality: N/A;  . OOPHORECTOMY    . PARTIAL MASTECTOMY WITH AXILLARY SENTINEL LYMPH NODE BIOPSY Right 08/09/2016   Procedure: PARTIAL MASTECTOMY WITH AXILLARY SENTINEL LYMPH NODE BIOPSY;  Surgeon: Robert Bellow, MD;  Location: [facility] ORS;  Service: General;  Laterality: Right;  . TONSILLECTOMY      There were no vitals filed for this visit.  Subjective Assessment - 12/13/18 0953    Subjective  R knee bothers her most mornings.  10/10 when sitting down onto a chair. R knee felt good after last session.    Pertinent History  Gait instability. Balance has been off for about 6-7 months. Unknown onset. Her PCP sent her to a neurologist who did not find anything. As far of neurologist is concerned, he could not find anything. The doctor called her condition a certain name but pt could not remember. Pt also went to an eye doctor yesterday who modified her glasses to see if it helps her with her balance. Pt usually loses her balance when she  is walking. Pt gets a dizzy spell when she is walking which just happens. Pt does not feel like she is going to black out, the room does not feel like it is turning. Pt just feels off balance but has difficulty describing it. Denies neck, back pain or UE and LE paresthesias. No falls within the last year. Started using a SPC the last couple of weaks due to fear of falling.    Patient Stated Goals  Want to get her balance better.    Currently in Pain?  Yes    Pain Score  10-Worst pain ever                               PT Education - 12/13/18 1006    Education Details  ther-ex    Person(s) Educated  Patient    Methods   Explanation;Demonstration;Tactile cues;Verbal cues    Comprehension  Returned demonstration;Verbalized understanding      Objectives  No latex band allergies   Manual therapy  Seated STM R lateral hamstrings, IT band, Vastus lateralis             No R knee pain with gait with SPC afterwards             "Feels better" per pt   Therapeutic exercise  Gait with SPC on L side (pt had SPC on R side with R knee pain) 40 ft  Decreased R knee pain  Stand to sit with proper technique, especially with femoral control. Decreased R knee pain to 8/10   Seated R hip extension isometrics to promote R glute max strengthening 10x3 with 5 second holds   Standing hip abduction with B UE assist to promote glute med strengthening to promote femoral control   R 10x3  Sit <> stand throughout session with emphasis on proper technique, body position, and femoral control to decrease stress to her knees.    Pt was recommended to keep her knees from turning in whenever she stands up and sits down onto a surface as well as to not twist as she sits to decrease stress to her knees. Pt verbalized understanding.   HEP not yet provided secondary to possible memory related factors observed.     Improved exercise technique, movement at target joints, use of target muscles after mod to max verbal, visual, tactile cues.        Response to treatment Good muscle use felt with exercises. Pt tolerated session well without aggravation of symptoms   Clinical impression Decreased R knee pain after manual therapy and cues for femoral control. Pt demonstrates poor femoral control with closed chain activities and transfers, needing goes to correct genu valgus as well as to promote proper positioning prior to sitting to decrease stress to her knees. Pt will benefit from continued skilled physical therapy services to improve LE strength, mechanics, decrease R knee pain with weight bearing, decrease fall risk,  and decrease difficulty with gait.      PT Short Term Goals - 11/29/18 1305      PT SHORT TERM GOAL #1   Title  Pt will be independent with her HEP to improve LE strength, function, balance, and ability to ambulate.    Time  3    Period  Weeks    Status  New    Target Date  12/21/18        PT Long Term Goals - 11/29/18 1307  PT LONG TERM GOAL #1   Title  Patient will improve bilateral hip extension and abduction strength by at least 1/2 MMT grade to promote ability to ambulate more steadiliy and with less difficulty as well as perform transfers with less difficulty.    Time  8    Period  Weeks    Status  New    Target Date  01/25/19      PT LONG TERM GOAL #2   Title  Pt will improve her DGI score to 19 or more as a demonstration of improved balance and decrease fall risk.    Baseline  15 without use of AD (11/29/2018)    Time  8    Period  Weeks    Status  New    Target Date  01/25/19      PT LONG TERM GOAL #3   Title  Patient will have a decrease in R knee pain to 5/10 or less at worst to promote ability to ambulate more comfortably and decrease fall risk.    Baseline  10/10 R knee pain at most for the past 3 months (11/29/2018)    Time  8    Period  Weeks    Status  New    Target Date  01/25/19            Plan - 12/13/18 1236    Clinical Impression Statement  Decreased R knee pain after manual therapy and cues for femoral control. Pt demonstrates poor femoral control with closed chain activities and transfers, needing goes to correct genu valgus as well as to promote proper positioning prior to sitting to decrease stress to her knees. Pt will benefit from continued skilled physical therapy services to improve LE strength, mechanics, decrease R knee pain with weight bearing, decrease fall risk, and decrease difficulty with gait.    Personal Factors and Comorbidities  Age;Comorbidity 1    Comorbidities  hx of CA    Examination-Activity Limitations  Locomotion  Level;Transfers;Carry;Stairs    Stability/Clinical Decision Making  Evolving/Moderate complexity    Rehab Potential  Fair    PT Frequency  2x / week    PT Duration  8 weeks    PT Treatment/Interventions  Therapeutic exercise;Balance training;Neuromuscular re-education;Patient/family education;Therapeutic activities;Gait training;Stair training;Electrical Stimulation;Iontophoresis 4mg /ml Dexamethasone;Canalith Repostioning;Aquatic Therapy;Manual techniques;Dry needling    PT Next Visit Plan  hip and trunk strengthening, femoral control, balance, manual techniques, modalities PRN    Consulted and Agree with Plan of Care  Patient       Patient will benefit from skilled therapeutic intervention in order to improve the following deficits and impairments:  Pain, Improper body mechanics, Postural dysfunction, Abnormal gait, Decreased balance, Decreased activity tolerance, Decreased endurance, Difficulty walking, Decreased strength, Dizziness  Visit Diagnosis: Muscle weakness (generalized)  Unsteadiness on feet  Chronic pain of right knee  Difficulty in walking, not elsewhere classified     Problem List Patient Active Problem List   Diagnosis Date Noted  . Disorder of bursae of shoulder region 10/20/2018  . Lower GI bleeding 09/10/2018  . Hiatal hernia   . Melena   . Polyp of ascending colon   . Diverticulosis of large intestine without diverticulitis   . GIB (gastrointestinal bleeding) 09/05/2018  . LVH (left ventricular hypertrophy) due to hypertensive disease, without heart failure 04/10/2018  . Chest pain 03/31/2018  . Vasovagal syncope 03/20/2018  . Lichen planus [id number]  . Malignant neoplasm of upper-inner quadrant of right breast in female, estrogen receptor positive ([place])  07/28/2016  . Gastric ulcer requiring drug therapy, chronic 01/23/2016  . MI (mitral incompetence) 07/22/2015  . Combined fat and carbohydrate induced hyperlipemia 12/26/2014  . UTI (lower urinary tract  infection) 08/18/2014  . Blurry vision 08/18/2014  . Allergic rhinitis 07/12/2014  . Absolute anemia 07/12/2014  . Baker's cyst of knee 07/12/2014  . Atherosclerosis of coronary artery 07/12/2014  . CAFL (chronic airflow limitation) ([place]) 07/12/2014  . Dizziness 07/12/2014  . Essential (primary) hypertension 07/12/2014  . Acid reflux 07/12/2014  . 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 History of colon polyps 07/12/2014  . Hypercholesteremia 07/12/2014  . Malaise and fatigue 07/12/2014  . Heart attack ([place]) 07/12/2014  . Muscle ache 07/12/2014  . Arthritis, degenerative 07/12/2014  . Bradycardia 08/23/2013  . Arteriosclerosis of coronary artery 08/17/2013  . Diabetes ([place]) 08/17/2013  . Diabetes mellitus ([place]) 08/17/2013  . Peripheral vascular disease ([place]) 08/17/2013    Joneen Boers PT, DPT   12/13/2018, 12:42 PM  [facility] [address] [place], [address] Phone: [phone]   Fax:  [phone]  Name: [name] [name] MRN: [id number] Date of Birth: May 04, 1940

## 2018-12-14 ENCOUNTER — Encounter: Payer: Self-pay | Admitting: Radiation Oncology

## 2018-12-14 ENCOUNTER — Other Ambulatory Visit: Payer: Self-pay

## 2018-12-14 ENCOUNTER — Ambulatory Visit
Admission: RE | Admit: 2018-12-14 | Discharge: 2018-12-14 | Disposition: A | Discharge status: Home / Self Care | Payer: PPO | Source: Ambulatory Visit | Attending: Radiation Oncology | Admitting: Radiation Oncology

## 2018-12-14 VITALS — BP 182/92 | HR 78 | Temp 96.6°F | Resp 16 | Wt 135.1 lb

## 2018-12-14 DIAGNOSIS — Z79811 Long term (current) use of aromatase inhibitors: Secondary | ICD-10-CM | POA: Diagnosis not present

## 2018-12-14 DIAGNOSIS — C50211 Malignant neoplasm of upper-inner quadrant of right female breast: Secondary | ICD-10-CM

## 2018-12-14 DIAGNOSIS — Z923 Personal history of irradiation: Secondary | ICD-10-CM | POA: Insufficient documentation

## 2018-12-14 DIAGNOSIS — Z170 Estrogen receptor positive status [ER+]: Secondary | ICD-10-CM | POA: Diagnosis not present

## 2018-12-14 NOTE — Progress Notes (Signed)
Radiation Oncology Follow up Note  Name: [name] [name]   Date:   12/14/2018 MRN:  [id number] DOB: 1941-01-17    This 78 y.o. female presents to the clinic today for 2-year follow-up status post.  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  Seen today in routine follow-up she is doing well.  She specifically denies breast tenderness cough or bone pain..  She is currently on Femara tolerating that well without side effect.  Patient had a mammogram back in February which I have reviewed is a BI-RADS 2 benign.  COMPLICATIONS OF TREATMENT: none  FOLLOW UP COMPLIANCE: keeps appointments   PHYSICAL EXAM:  BP (!) 182/92 (BP Location: Left Arm, Patient Position: Sitting)   Pulse 78   Temp (!) 96.6 F (35.9 C) (Tympanic)   Resp 16   Wt 135 lb 1.6 oz (61.3 kg)   BMI 23.19 kg/m  Lungs are clear to A&P cardiac examination essentially unremarkable with regular rate and rhythm. No dominant mass or nodularity is noted in either breast in 2 positions examined. Incision is well-healed. No axillary or supraclavicular adenopathy is appreciated.  She does have significant hyperpigmentation which is unusual of the right breast cosmetic result is fair well-developed well-nourished patient in NAD. HEENT reveals PERLA, EOMI, discs not visualized.  Oral cavity is clear. No oral mucosal lesions are identified. Neck is clear without evidence of cervical or supraclavicular adenopathy. Lungs are clear to A&P. Cardiac examination is essentially unremarkable with regular rate and rhythm without murmur rub or thrill. Abdomen is benign with no organomegaly or masses noted. Motor sensory and DTR levels are equal and symmetric in the upper and lower extremities. Cranial nerves II  through XII are grossly intact. Proprioception is intact. No peripheral adenopathy or edema is identified. No motor or sensory levels are noted. Crude visual fields are within normal range.  RADIOLOGY RESULTS: Mammograms reviewed compatible with above-stated findings  PLAN: Present time patient continues to do well 2 years out with no evidence of disease.  I am pleased with her overall progress.  I have asked to see her back in 1 year for follow-up.  She is already scheduled for follow-up mammograms in February.  She continues on Femara without side effect.  Patient knows to call with any concerns.  I would like to take this opportunity to thank you for allowing me to participate in the care of your patient.Noreene Filbert, MD

## 2018-12-18 ENCOUNTER — Other Ambulatory Visit: Payer: Self-pay

## 2018-12-18 ENCOUNTER — Ambulatory Visit: Payer: PPO

## 2018-12-18 DIAGNOSIS — R262 Difficulty in walking, not elsewhere classified: Secondary | ICD-10-CM

## 2018-12-18 DIAGNOSIS — M6281 Muscle weakness (generalized): Secondary | ICD-10-CM

## 2018-12-18 DIAGNOSIS — M25561 Pain in right knee: Secondary | ICD-10-CM | POA: Diagnosis not present

## 2018-12-18 DIAGNOSIS — R2681 Unsteadiness on feet: Secondary | ICD-10-CM

## 2018-12-18 DIAGNOSIS — G8929 Other chronic pain: Secondary | ICD-10-CM

## 2018-12-18 NOTE — Therapy (Signed)
[facility] [address] [place], [address] Phone: [phone]   Fax:  [phone]  Physical Therapy Treatment  Patient Details  Name: [name] [name] MRN: [id number] Date of Birth: [birth year] Referring Provider (PT):  Aschoff, MD   Encounter Date: 12/18/2018  PT End of Session - 12/18/18 1039    Visit Number  4    Number of Visits  17    Date for PT Re-Evaluation  01/25/19    Authorization Type  4    Authorization Time Period  of 10 Medicare progress report    PT Start Time  0952    PT Stop Time  1033    PT Time Calculation (min)  41 min    Equipment Utilized During Treatment  Gait belt    Activity Tolerance  Patient tolerated treatment well    Behavior During Therapy  [facility] for tasks assessed/performed       Past Medical History:  Diagnosis Date  . Breast cancer ([place]) 2018   Right breast  . 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 Mucin noted on biopsy, not on wide excision.  . Coronary artery disease   . GERD (gastroesophageal reflux disease)   . Hyperlipidemia   . Hypertension   . 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 Dr. Kellie Moor DX by punch bioppsy  . Lichen planus    right breast   . Lichen planus   . MI (myocardial infarction) ([place])    2015  . Personal history of radiation therapy 09/2016   RIGHT lumpectomy w/ radiation    Past Surgical History:  Procedure Laterality Date  . ABDOMINAL HYSTERECTOMY    . APPENDECTOMY    . BREAST BIOPSY Right 07/23/2016   INVASIVE MAMMARY CARCINOMA WITH AREAS OF EXTRACELLULAR MUCIN  . BREAST EXCISIONAL BIOPSY     INVASIVE MUCINOUS MAMMARY CARCINOMA.   Marland Kitchen BREAST LUMPECTOMY Right 08/20/2016   INVASIVE MUCINOUS MAMMARY CARCINOMA. /Grade 2   . COLONOSCOPY  2016  . COLONOSCOPY N/A 09/06/2018   Procedure: COLONOSCOPY;  Surgeon: Virgel Manifold, MD;   Location: [facility] ENDOSCOPY;  Service: Endoscopy;  Laterality: N/A;  . CORONARY ANGIOPLASTY WITH STENT PLACEMENT    . CYSTOSCOPY    . ESOPHAGOGASTRODUODENOSCOPY N/A 09/06/2018   Procedure: ESOPHAGOGASTRODUODENOSCOPY (EGD);  Surgeon: Virgel Manifold, MD;  Location: [facility] ENDOSCOPY;  Service: Endoscopy;  Laterality: N/A;  . OOPHORECTOMY    . PARTIAL MASTECTOMY WITH AXILLARY SENTINEL LYMPH NODE BIOPSY Right 08/09/2016   Procedure: PARTIAL MASTECTOMY WITH AXILLARY SENTINEL LYMPH NODE BIOPSY;  Surgeon: Robert Bellow, MD;  Location: [facility] ORS;  Service: General;  Laterality: Right;  . TONSILLECTOMY      There were no vitals filed for this visit.  Subjective Assessment - 12/18/18 0953    Subjective  Does not feel too good today. Feels tired. No difficulty breathing, no fever. More like tired. R knee is hurting a little bit today. Maybe its the weather.    Pertinent History  Gait instability. Balance has been off for about 6-7 months. Unknown onset. Her PCP sent her to a neurologist who did not find anything. As far of neurologist is concerned, he could not find anything. The doctor called her condition a certain name but pt could not remember. Pt also went to an eye doctor yesterday who modified her glasses to see if it helps her with her balance.  Pt usually loses her balance when she is walking. Pt gets a dizzy spell when she is walking which just happens. Pt does not feel like she is going to black out, the room does not feel like it is turning. Pt just feels off balance but has difficulty describing it. Denies neck, back pain or UE and LE paresthesias. No falls within the last year. Started using a SPC the last couple of weaks due to fear of falling.    Patient Stated Goals  Want to get her balance better.    Currently in Pain?  Yes    Pain Score  6    R knee when walking                              PT Education - 12/18/18 0957    Education Details  ther-ex, HEP     Person(s) Educated  Patient    Methods  Explanation;Demonstration;Tactile cues;Verbal cues;Handout    Comprehension  Returned demonstration;Verbalized understanding        Objectives  No latex band allergies  Medbridge Access Code: [id number]     Manual therapy  Seated STM R lateral hamstrings, IT band, Vastus lateralis   Therapeutic exercise  Blood pressure, L arm sitting, mechanically taken at start of session, normal cuff: 148/66, HR 75  Seated R hip extension isometrics to promote R glute max strengthening 10x3 with 5 second holds   Standing hip abduction with B UE assist to promote glute med strengthening to promote femoral control              R 10x3  L 10x3   Sit <> stand with emphasis on femoral control with B UE assist 5x   Forward step up onto Air Ex pad with one UE assist 1x. Discomfort R knee  Static mini lunge with L UE assist 3x.   Max cues for femoral control. Difficulty with femoral control     Improved exercise technique, movement at target joints, use of target muscles after mod to max verbal, visual, tactile cues.     Response to treatment Good muscle use felt with exercises. R knee pain with forward step up onto Air Ex pad. Eases with rest. Decreased R knee pain at end of session with gait.   Clinical impression Continued working on glute med and max strengthening to help promote femoral control, decrease stress to R knee and decrease R knee pain to promote ability to ambulate more comfortably and more steadily. Decreased R knee pain with gait after session. Pt will benefit from continued skilled physical therapy to decrease pain, improve strength, function, and decrease difficulty with ambulation.     PT Short Term Goals - 11/29/18 1305      PT SHORT TERM GOAL #1   Title  Pt will be independent with her HEP to improve LE strength, function, balance, and ability to ambulate.    Time  3    Period  Weeks    Status  New    Target  Date  12/21/18        PT Long Term Goals - 11/29/18 1307      PT LONG TERM GOAL #1   Title  Patient will improve bilateral hip extension and abduction strength by at least 1/2 MMT grade to promote ability to ambulate more steadiliy and with less difficulty as well as perform transfers with less difficulty.    Time  8    Period  Weeks    Status  New    Target Date  01/25/19      PT LONG TERM GOAL #2   Title  Pt will improve her DGI score to 19 or more as a demonstration of improved balance and decrease fall risk.    Baseline  15 without use of AD (11/29/2018)    Time  8    Period  Weeks    Status  New    Target Date  01/25/19      PT LONG TERM GOAL #3   Title  Patient will have a decrease in R knee pain to 5/10 or less at worst to promote ability to ambulate more comfortably and decrease fall risk.    Baseline  10/10 R knee pain at most for the past 3 months (11/29/2018)    Time  8    Period  Weeks    Status  New    Target Date  01/25/19            Plan - 12/18/18 1142    Clinical Impression Statement  Continued working on glute med and max strengthening to help promote femoral control, decrease stress to R knee and decrease R knee pain to promote ability to ambulate more comfortably and more steadily. Decreased R knee pain with gait after session. Pt will benefit from continued skilled physical therapy to decrease pain, improve strength, function, and decrease difficulty with ambulation.    Personal Factors and Comorbidities  Age;Comorbidity 1    Comorbidities  hx of CA    Examination-Activity Limitations  Locomotion Level;Transfers;Carry;Stairs    Stability/Clinical Decision Making  Evolving/Moderate complexity    Rehab Potential  Fair    PT Frequency  2x / week    PT Duration  8 weeks    PT Treatment/Interventions  Therapeutic exercise;Balance training;Neuromuscular re-education;Patient/family education;Therapeutic activities;Gait training;Stair training;Electrical  Stimulation;Iontophoresis 4mg /ml Dexamethasone;Canalith Repostioning;Aquatic Therapy;Manual techniques;Dry needling    PT Next Visit Plan  hip and trunk strengthening, femoral control, balance, manual techniques, modalities PRN    Consulted and Agree with Plan of Care  Patient       Patient will benefit from skilled therapeutic intervention in order to improve the following deficits and impairments:  Pain, Improper body mechanics, Postural dysfunction, Abnormal gait, Decreased balance, Decreased activity tolerance, Decreased endurance, Difficulty walking, Decreased strength, Dizziness  Visit Diagnosis: Muscle weakness (generalized)  Unsteadiness on feet  Chronic pain of right knee  Difficulty in walking, not elsewhere classified     Problem List Patient Active Problem List   Diagnosis Date Noted  . Disorder of bursae of shoulder region 10/20/2018  . Lower GI bleeding 09/10/2018  . Hiatal hernia   . Melena   . Polyp of ascending colon   . Diverticulosis of large intestine without diverticulitis   . GIB (gastrointestinal bleeding) 09/05/2018  . LVH (left ventricular hypertrophy) due to hypertensive disease, without heart failure 04/10/2018  . Chest pain 03/31/2018  . Vasovagal syncope 03/20/2018  . Lichen planus [id number]  . Malignant neoplasm of upper-inner quadrant of right breast in female, estrogen receptor positive ([place]) 07/28/2016  . Gastric ulcer requiring drug therapy, chronic 01/23/2016  . MI (mitral incompetence) 07/22/2015  . Combined fat and carbohydrate induced hyperlipemia 12/26/2014  . UTI (lower urinary tract infection) 08/18/2014  . Blurry vision 08/18/2014  . Allergic rhinitis 07/12/2014  . Absolute anemia 07/12/2014  . Baker's cyst of knee 07/12/2014  . Atherosclerosis of coronary artery 07/12/2014  .  CAFL (chronic airflow limitation) ([place]) 07/12/2014  . Dizziness 07/12/2014  . Essential (primary) hypertension 07/12/2014  . Acid reflux 07/12/2014  .  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 History of colon polyps 07/12/2014  . Hypercholesteremia 07/12/2014  . Malaise and fatigue 07/12/2014  . Heart attack ([place]) 07/12/2014  . Muscle ache 07/12/2014  . Arthritis, degenerative 07/12/2014  . Bradycardia 08/23/2013  . Arteriosclerosis of coronary artery 08/17/2013  . Diabetes ([place]) 08/17/2013  . Diabetes mellitus ([place]) 08/17/2013  . Peripheral vascular disease ([employer]) 08/17/2013    Joneen Boers PT, DPT   12/18/2018, 11:52 AM  [facility] [address], [place], [address] Phone: [phone]   Fax:  [phone]  Name: [name] [name] MRN: [id number] Date of Birth: 1940/09/10

## 2018-12-18 NOTE — Patient Instructions (Addendum)
Seated hip extension isometrics   Sitting on a chair,    Squeeze your rear end muscles together and press your R foot onto the floor.     Hold for 5 seconds    Repeat 10 times   Perform 3 sets daily.        Access Code: [email or web address]  URL: [email or web address]  Date: 12/18/2018  Prepared by: Joneen Boers   Exercises Standing Hip Abduction with Counter Support - 10 reps - 3 sets - 1x daily - 7x weekly

## 2018-12-20 ENCOUNTER — Ambulatory Visit: Payer: PPO

## 2018-12-20 ENCOUNTER — Other Ambulatory Visit: Payer: Self-pay

## 2018-12-20 DIAGNOSIS — M6281 Muscle weakness (generalized): Secondary | ICD-10-CM

## 2018-12-20 DIAGNOSIS — R2681 Unsteadiness on feet: Secondary | ICD-10-CM

## 2018-12-20 DIAGNOSIS — M25561 Pain in right knee: Secondary | ICD-10-CM | POA: Diagnosis not present

## 2018-12-20 DIAGNOSIS — G8929 Other chronic pain: Secondary | ICD-10-CM

## 2018-12-20 DIAGNOSIS — R262 Difficulty in walking, not elsewhere classified: Secondary | ICD-10-CM

## 2018-12-20 NOTE — Patient Instructions (Signed)
Access Code: [email or web address]  URL: [email or web address]  Date: 12/20/2018  Prepared by: Joneen Boers   Exercises Standing Hip Abduction with Counter Support - 10 reps - 3 sets - 1x daily - 7x weekly Seated Hip Adduction Isometrics with Ball - 10 reps - 3 sets - 5 seconds hold - 1x daily - 7x weekly

## 2018-12-20 NOTE — Therapy (Signed)
[facility] [address], [place], [address] Phone: [phone]   Fax:  [phone]  Physical Therapy Treatment  Patient Details  Name: [name] [name] MRN: [id number] Date of Birth: 09-Dec-1940 Referring Provider (PT):  Aschoff, MD   Encounter Date: 12/20/2018  PT End of Session - 12/20/18 0949    Visit Number  5    Number of Visits  17    Date for PT Re-Evaluation  01/25/19    Authorization Type  5    Authorization Time Period  of 10 Medicare progress report    PT Start Time  0949    PT Stop Time  1028    PT Time Calculation (min)  39 min    Equipment Utilized During Treatment  Gait belt    Activity Tolerance  Patient tolerated treatment well    Behavior During Therapy  [facility] for tasks assessed/performed       Past Medical History:  Diagnosis Date  . Breast cancer ([place]) 2018   Right breast  . 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 Mucin noted on biopsy, not on wide excision.  . Coronary artery disease   . GERD (gastroesophageal reflux disease)   . Hyperlipidemia   . Hypertension   . 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 Dr. Kellie Moor DX by punch bioppsy  . Lichen planus    right breast   . Lichen planus   . MI (myocardial infarction) ([place])    2015  . Personal history of radiation therapy 09/2016   RIGHT lumpectomy w/ radiation    Past Surgical History:  Procedure Laterality Date  . ABDOMINAL HYSTERECTOMY    . APPENDECTOMY    . BREAST BIOPSY Right 07/23/2016   INVASIVE MAMMARY CARCINOMA WITH AREAS OF EXTRACELLULAR MUCIN  . BREAST EXCISIONAL BIOPSY     INVASIVE MUCINOUS MAMMARY CARCINOMA.   Marland Kitchen BREAST LUMPECTOMY Right 08/20/2016   INVASIVE MUCINOUS MAMMARY CARCINOMA. /Grade 2   . COLONOSCOPY  2016  . COLONOSCOPY N/A 09/06/2018   Procedure: COLONOSCOPY;  Surgeon: Virgel Manifold, MD;   Location: [facility] ENDOSCOPY;  Service: Endoscopy;  Laterality: N/A;  . CORONARY ANGIOPLASTY WITH STENT PLACEMENT    . CYSTOSCOPY    . ESOPHAGOGASTRODUODENOSCOPY N/A 09/06/2018   Procedure: ESOPHAGOGASTRODUODENOSCOPY (EGD);  Surgeon: Virgel Manifold, MD;  Location: [facility] ENDOSCOPY;  Service: Endoscopy;  Laterality: N/A;  . OOPHORECTOMY    . PARTIAL MASTECTOMY WITH AXILLARY SENTINEL LYMPH NODE BIOPSY Right 08/09/2016   Procedure: PARTIAL MASTECTOMY WITH AXILLARY SENTINEL LYMPH NODE BIOPSY;  Surgeon: Robert Bellow, MD;  Location: [facility] ORS;  Service: General;  Laterality: Right;  . TONSILLECTOMY      There were no vitals filed for this visit.  Subjective Assessment - 12/20/18 0949    Subjective  R knee is sore. 8/10 when walking from the entrance to the gym.    Pertinent History  Gait instability. Balance has been off for about 6-7 months. Unknown onset. Her PCP sent her to a neurologist who did not find anything. As far of neurologist is concerned, he could not find anything. The doctor called her condition a certain name but pt could not remember. Pt also went to an eye doctor yesterday who modified her glasses to see if it helps her with her balance. Pt usually loses her balance when she is walking. Pt gets a dizzy spell when  she is walking which just happens. Pt does not feel like she is going to black out, the room does not feel like it is turning. Pt just feels off balance but has difficulty describing it. Denies neck, back pain or UE and LE paresthesias. No falls within the last year. Started using a SPC the last couple of weaks due to fear of falling.    Patient Stated Goals  Want to get her balance better.    Currently in Pain?  Yes    Pain Score  8                                PT Education - 12/20/18 1022    Education Details  ther-ex, HEP    Person(s) Educated  Patient    Methods  Explanation;Demonstration;Tactile cues;Verbal cues;Handout    Comprehension   Verbalized understanding;Returned demonstration         Objectives  No latex band allergies    (No Pain) 0 1 2 3 4 5 6 7 8 9  10 (highest pain)      ----------------------------    Medbridge Access Code: [email or web address]     Manual therapy  Seated STM R lateral hamstrings, IT band, Vastus lateralis   Therapeutic exercise  Seated R hip ER 10x3  Sit <> stand from regular chair with yellow band around thighs resisting hip abduciont/ER   5x2  Seated hip adduction ball and glute max squeeze 10x3 with 5 second holds  Seated R hamstring flexion targeting medial hamstrings yellow band 10x2    Improved exercise technique, movement at target joints, use of target muscles after mod to max verbal, visual, tactile cues.     Response to treatment Good muscle use felt with exercises. Decreased R knee pain with gait after session  Clinical impression  Continued working on femoral control, hip muscle strengthening, and promoting better mechanics at her R knee joint. Decreased R knee pain with gait after session reported by pt. Worked in decreasing R knee pain to improve balance. Pt will benefit from continued skilled physical therapy services to decrease pain, improve LE strength, balance, and decrease difficulty with gait.     PT Short Term Goals - 11/29/18 1305      PT SHORT TERM GOAL #1   Title  Pt will be independent with her HEP to improve LE strength, function, balance, and ability to ambulate.    Time  3    Period  Weeks    Status  New    Target Date  12/21/18        PT Long Term Goals - 11/29/18 1307      PT LONG TERM GOAL #1   Title  Patient will improve bilateral hip extension and abduction strength by at least 1/2 MMT grade to promote ability to ambulate more steadiliy and with less difficulty as well as perform transfers with less difficulty.    Time  8    Period  Weeks    Status  New    Target Date  01/25/19      PT LONG TERM GOAL #2    Title  Pt will improve her DGI score to 19 or more as a demonstration of improved balance and decrease fall risk.    Baseline  15 without use of AD (11/29/2018)    Time  8    Period  Weeks    Status  New  Target Date  01/25/19      PT LONG TERM GOAL #3   Title  Patient will have a decrease in R knee pain to 5/10 or less at worst to promote ability to ambulate more comfortably and decrease fall risk.    Baseline  10/10 R knee pain at most for the past 3 months (11/29/2018)    Time  8    Period  Weeks    Status  New    Target Date  01/25/19            Plan - 12/20/18 1023    Clinical Impression Statement  Continued working on femoral control, hip muscle strengthening, and promoting better mechanics at her R knee joint. Decreased R knee pain with gait after session reported by pt. Worked in decreasing R knee pain to improve balance. Pt will benefit from continued skilled physical therapy services to decrease pain, improve LE strength, balance, and decrease difficulty with gait.    Personal Factors and Comorbidities  Age;Comorbidity 1    Comorbidities  hx of CA    Examination-Activity Limitations  Locomotion Level;Transfers;Carry;Stairs    Stability/Clinical Decision Making  Evolving/Moderate complexity    Rehab Potential  Fair    PT Frequency  2x / week    PT Duration  8 weeks    PT Treatment/Interventions  Therapeutic exercise;Balance training;Neuromuscular re-education;Patient/family education;Therapeutic activities;Gait training;Stair training;Electrical Stimulation;Iontophoresis 4mg /ml Dexamethasone;Canalith Repostioning;Aquatic Therapy;Manual techniques;Dry needling    PT Next Visit Plan  hip and trunk strengthening, femoral control, balance, manual techniques, modalities PRN    Consulted and Agree with Plan of Care  Patient       Patient will benefit from skilled therapeutic intervention in order to improve the following deficits and impairments:  Pain, Improper body  mechanics, Postural dysfunction, Abnormal gait, Decreased balance, Decreased activity tolerance, Decreased endurance, Difficulty walking, Decreased strength, Dizziness  Visit Diagnosis: Chronic pain of right knee  Muscle weakness (generalized)  Unsteadiness on feet  Difficulty in walking, not elsewhere classified     Problem List Patient Active Problem List   Diagnosis Date Noted  . Disorder of bursae of shoulder region 10/20/2018  . Lower GI bleeding 09/10/2018  . Hiatal hernia   . Melena   . Polyp of ascending colon   . Diverticulosis of large intestine without diverticulitis   . GIB (gastrointestinal bleeding) 09/05/2018  . LVH (left ventricular hypertrophy) due to hypertensive disease, without heart failure 04/10/2018  . Chest pain 03/31/2018  . Vasovagal syncope 03/20/2018  . Lichen planus [id number]  . Malignant neoplasm of upper-inner quadrant of right breast in female, estrogen receptor positive ([place]) 07/28/2016  . Gastric ulcer requiring drug therapy, chronic 01/23/2016  . MI (mitral incompetence) 07/22/2015  . Combined fat and carbohydrate induced hyperlipemia 12/26/2014  . UTI (lower urinary tract infection) 08/18/2014  . Blurry vision 08/18/2014  . Allergic rhinitis 07/12/2014  . Absolute anemia 07/12/2014  . Baker's cyst of knee 07/12/2014  . Atherosclerosis of coronary artery 07/12/2014  . CAFL (chronic airflow limitation) ([place]) 07/12/2014  . Dizziness 07/12/2014  . Essential (primary) hypertension 07/12/2014  . Acid reflux 07/12/2014  . 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 History of colon polyps 07/12/2014  . Hypercholesteremia 07/12/2014  . Malaise and fatigue 07/12/2014  . Heart attack ([place]) 07/12/2014  . Muscle ache 07/12/2014  . Arthritis, degenerative 07/12/2014  . Bradycardia 08/23/2013  . Arteriosclerosis of coronary artery 08/17/2013  . Diabetes ([place]) 08/17/2013  . Diabetes mellitus ([place]) 08/17/2013  . Peripheral vascular disease (  [place])  08/17/2013    Joneen Boers PT, DPT   12/20/2018, 12:33 PM  [facility] [address] [place], [address] Phone: [phone]   Fax:  [phone]  Name: [name] [name] MRN: [id number] Date of Birth: Feb 15, 1941

## 2018-12-25 ENCOUNTER — Encounter: Payer: Self-pay | Admitting: Family Medicine

## 2018-12-25 ENCOUNTER — Ambulatory Visit: Payer: PPO

## 2018-12-25 ENCOUNTER — Ambulatory Visit (INDEPENDENT_AMBULATORY_CARE_PROVIDER_SITE_OTHER): Payer: PPO | Admitting: Family Medicine

## 2018-12-25 ENCOUNTER — Other Ambulatory Visit: Payer: Self-pay

## 2018-12-25 VITALS — BP 144/82 | HR 72 | Temp 98.0°F | Resp 20 | Wt 138.0 lb

## 2018-12-25 DIAGNOSIS — M25559 Pain in unspecified hip: Secondary | ICD-10-CM

## 2018-12-25 DIAGNOSIS — G20 Parkinson's disease: Secondary | ICD-10-CM

## 2018-12-25 DIAGNOSIS — D649 Anemia, unspecified: Secondary | ICD-10-CM | POA: Diagnosis not present

## 2018-12-25 DIAGNOSIS — E7800 Pure hypercholesterolemia, unspecified: Secondary | ICD-10-CM | POA: Diagnosis not present

## 2018-12-25 DIAGNOSIS — M25561 Pain in right knee: Secondary | ICD-10-CM

## 2018-12-25 DIAGNOSIS — K5730 Diverticulosis of large intestine without perforation or abscess without bleeding: Secondary | ICD-10-CM | POA: Diagnosis not present

## 2018-12-25 DIAGNOSIS — I10 Essential (primary) hypertension: Secondary | ICD-10-CM

## 2018-12-25 DIAGNOSIS — I2510 Atherosclerotic heart disease of native coronary artery without angina pectoris: Secondary | ICD-10-CM | POA: Diagnosis not present

## 2018-12-25 DIAGNOSIS — E119 Type 2 diabetes mellitus without complications: Secondary | ICD-10-CM | POA: Diagnosis not present

## 2018-12-25 DIAGNOSIS — R2681 Unsteadiness on feet: Secondary | ICD-10-CM | POA: Diagnosis not present

## 2018-12-25 MED ORDER — PREDNISONE 5 MG PO TABS: ORAL_TABLET | ORAL | 0 refills | Status: DC

## 2018-12-25 NOTE — Patient Instructions (Signed)
Take Lisinopril 40mg  daily.

## 2018-12-25 NOTE — Progress Notes (Signed)
Patient: [name] [name] Female    DOB: May 21, 1940   78 y.o.   MRN: [id number] Visit Date: 12/25/2018  Today's Provider: Wilhemena Durie, MD   Chief Complaint  Patient presents with  . Foot Swelling  . Knee Pain    left knee pain    Subjective:   HPI   Patient comes in today c/o swelling in both feet. She reports that this has worsened in the last week. She denies any changes in medications. She reports that she does feel unsteady on her feet as well.  She is getting weaker in general. No CP/SOB/orthopnea. No recurrence of GI bleed. BP Readings from Last 3 Encounters:  12/25/18 (!) 144/82  12/14/18 (!) 182/92  11/22/18 (!) 196/88    Allergies  Allergen Reactions  . Celebrex  [Celecoxib]     Dark stools.  . Penicillins Other (See Comments)    Childhood reaction Has patient had a PCN reaction causing immediate rash, facial/tongue/throat swelling, SOB or lightheadedness with hypotension: Unknown Has patient had a PCN reaction causing severe rash involving mucus membranes or skin necrosis: Unknown Has patient had a PCN reaction that required hospitalization: Unknown Has patient had a PCN reaction occurring within the last 10 years: Unknown If all of the above answers are "NO", then may proceed with Cephalosporin use.   Marland Kitchen Prevacid [Lansoprazole] Diarrhea     Current Outpatient Medications:  .  Calcium Carb-Cholecalciferol (CALCIUM 500+D PO), Take by mouth every other day., Disp: , Rfl:  .  letrozole (FEMARA) 2.5 MG tablet, Take 2.5 mg by mouth daily., Disp: , Rfl:  .  lisinopril (ZESTRIL) 40 MG tablet, Take 1 tablet (40 mg total) by mouth daily. (Patient taking differently: Take 40 mg by mouth daily. Takes 20mg  daily), Disp: 90 tablet, Rfl: 3  Review of Systems  Constitutional: Negative for appetite change, chills, fatigue and fever.  HENT: Negative.   Eyes: Negative.   Respiratory: Negative for chest tightness and shortness of breath.   Cardiovascular:  Negative for chest pain and palpitations.  Gastrointestinal: Negative for abdominal pain, nausea and vomiting.  Endocrine: Negative.   Musculoskeletal: Positive for arthralgias, gait problem and joint swelling.  Allergic/Immunologic: Negative.   Neurological: Positive for weakness. Negative for dizziness.  Psychiatric/Behavioral: Negative.     Social History   Tobacco Use  . Smoking status: Former Smoker    Packs/day: 0.25    Years: 10.00    Pack years: 2.50    Types: Cigarettes    Quit date: 03/06/1998    Years since quitting: 20.8  . Smokeless tobacco: Never Used  Substance Use Topics  . Alcohol use: No      Objective:   BP (!) 144/82   Pulse 72   Temp 98 F (36.7 C)   Resp 20   Wt 138 lb (62.6 kg)   SpO2 98%   BMI 23.69 kg/m  Vitals:   12/25/18 1606  BP: (!) 144/82  Pulse: 72  Resp: 20  Temp: 98 F (36.7 C)  SpO2: 98%  Weight: 138 lb (62.6 kg)  Body mass index is 23.69 kg/m.   Physical Exam Vitals signs reviewed.  Constitutional:      Appearance: She is well-developed.  HENT:     Head: Normocephalic and atraumatic.     Right Ear: External ear normal.     Left Ear: External ear normal.     Nose: Nose normal.  Eyes:     General: No scleral  icterus.    Extraocular Movements: Extraocular movements intact.     Conjunctiva/sclera: Conjunctivae normal.     Pupils: Pupils are equal, round, and reactive to light.  Neck:     Thyroid: No thyromegaly.  Cardiovascular:     Rate and Rhythm: Normal rate and regular rhythm.     Heart sounds: Normal heart sounds.  Pulmonary:     Effort: Pulmonary effort is normal.     Breath sounds: Normal breath sounds.  Abdominal:     Palpations: Abdomen is soft.  Musculoskeletal:     Comments: She has pain with weight bearing on right leg.  Lymphadenopathy:     Cervical: No cervical adenopathy.  Skin:    General: Skin is warm and dry.  Neurological:     Mental Status: She is alert and oriented to person, place, and  time.     Comments: Antalgic gait.  Psychiatric:        Mood and Affect: Mood normal.        Behavior: Behavior normal.        Thought Content: Thought content normal.        Judgment: Judgment normal.      No results found for any visits on 12/25/18.     Assessment & Plan    1. Acute pain of right knee Try prednisone taper and RTC 1 week. - DG Knee Complete 4 Views Right; Future - Sedimentation rate - CK - predniSONE (DELTASONE) 5 MG tablet; Take 6 tablets day 1, 5 tablets day 2, 4 tablets day 3, 3 tablets day 4, 2 tablets day 5, 1 tablet day 6.  Dispense: 21 tablet; Refill: 0  2. Hip pain Likely OA - DG Hip Unilat W OR W/O Pelvis 2-3 Views Right; Future - Sedimentation rate - CK - predniSONE (DELTASONE) 5 MG tablet; Take 6 tablets day 1, 5 tablets day 2, 4 tablets day 3, 3 tablets day 4, 2 tablets day 5, 1 tablet day 6.  Dispense: 21 tablet; Refill: 0  3. Essential (primary) hypertension Double dose from 20 to 40mg . Lisinopril. - CBC with Differential/Platelet - TSH - Comprehensive metabolic panel  4. Parkinson disease [facility]) She has Parkinsons stigmata --refer to Neurology. - Ambulatory referral to Neurology  5. Arteriosclerosis of coronary artery Risk factors treated.  6. Hypercholesteremia   7. Diverticulosis of large intestine without diverticulitis   8. Gait instability I am worried about fall risk.  9. Anemia, unspecified type   10. Type 2 diabetes mellitus without complication, unspecified whether long term insulin use ([place])      Wilhemena Durie, MD  [facility]

## 2018-12-26 ENCOUNTER — Telehealth: Payer: Self-pay | Admitting: Family Medicine

## 2018-12-26 ENCOUNTER — Ambulatory Visit
Admission: RE | Admit: 2018-12-26 | Discharge: 2018-12-26 | Disposition: A | Discharge status: Home / Self Care | Payer: PPO | Source: Ambulatory Visit | Attending: Family Medicine | Admitting: Family Medicine

## 2018-12-26 DIAGNOSIS — M25559 Pain in unspecified hip: Secondary | ICD-10-CM | POA: Insufficient documentation

## 2018-12-26 DIAGNOSIS — M25561 Pain in right knee: Secondary | ICD-10-CM | POA: Diagnosis not present

## 2018-12-26 DIAGNOSIS — I10 Essential (primary) hypertension: Secondary | ICD-10-CM | POA: Diagnosis not present

## 2018-12-26 DIAGNOSIS — M1611 Unilateral primary osteoarthritis, right hip: Secondary | ICD-10-CM | POA: Diagnosis not present

## 2018-12-26 NOTE — Telephone Encounter (Signed)
Pt needing to find out if she needs to continue with sports therapy. Dr Rosanna Randy forgot to let her know at yesterday's visit.   Please call pt back to let her know at [phone].  Thanks, [employer]

## 2018-12-26 NOTE — Telephone Encounter (Signed)
Advised patient as below.  

## 2018-12-26 NOTE — Telephone Encounter (Signed)
Please review. Thanks!  

## 2018-12-26 NOTE — Telephone Encounter (Signed)
I think it might be helpful to lower risk of falls.

## 2018-12-27 ENCOUNTER — Ambulatory Visit: Payer: PPO

## 2018-12-27 ENCOUNTER — Telehealth: Payer: Self-pay | Admitting: Family Medicine

## 2018-12-27 ENCOUNTER — Ambulatory Visit: Payer: Self-pay | Admitting: Family Medicine

## 2018-12-27 LAB — COMPREHENSIVE METABOLIC PANEL
ALT: 6 IU/L (ref 0–32)
AST: 16 IU/L (ref 0–40)
Albumin/Globulin Ratio: 1.6 (ref 1.2–2.2)
Albumin: 3.9 g/dL (ref 3.7–4.7)
Alkaline Phosphatase: 67 IU/L (ref 39–117)
BUN/Creatinine Ratio: 24 (ref 12–28)
BUN: 22 mg/dL (ref 8–27)
Bilirubin Total: 0.6 mg/dL (ref 0.0–1.2)
CO2: 25 mmol/L (ref 20–29)
Calcium: 9.9 mg/dL (ref 8.7–10.3)
Chloride: 108 mmol/L — ABNORMAL HIGH (ref 96–106)
Creatinine, Ser: 0.91 mg/dL (ref 0.57–1.00)
GFR calc Af Amer: 69 mL/min/{1.73_m2} (ref >59)
GFR calc non Af Amer: 60 mL/min/{1.73_m2} (ref >59)
Globulin, Total: 2.5 g/dL (ref 1.5–4.5)
Glucose: 79 mg/dL (ref 65–99)
Potassium: 4 mmol/L (ref 3.5–5.2)
Sodium: 145 mmol/L — ABNORMAL HIGH (ref 134–144)
Total Protein: 6.4 g/dL (ref 6.0–8.5)

## 2018-12-27 LAB — SEDIMENTATION RATE: Sed Rate: 15 mm/hr (ref 0–40)

## 2018-12-27 LAB — CBC WITH DIFFERENTIAL/PLATELET
Basophils Absolute: 0 10*3/uL (ref 0.0–0.2)
Basos: 1 %
EOS (ABSOLUTE): 0 10*3/uL (ref 0.0–0.4)
Eos: 0 %
Hematocrit: 35.4 % (ref 34.0–46.6)
Hemoglobin: 11.8 g/dL (ref 11.1–15.9)
Immature Grans (Abs): 0 10*3/uL (ref 0.0–0.1)
Immature Granulocytes: 0 %
Lymphocytes Absolute: 1.2 10*3/uL (ref 0.7–3.1)
Lymphs: 16 %
MCH: 28.1 pg (ref 26.6–33.0)
MCHC: 33.3 g/dL (ref 31.5–35.7)
MCV: 84 fL (ref 79–97)
Monocytes Absolute: 0.6 10*3/uL (ref 0.1–0.9)
Monocytes: 8 %
Neutrophils Absolute: 5.6 10*3/uL (ref 1.4–7.0)
Neutrophils: 75 %
Platelets: 250 10*3/uL (ref 150–450)
RBC: 4.2 x10E6/uL (ref 3.77–5.28)
RDW: 15.7 % — ABNORMAL HIGH (ref 11.7–15.4)
WBC: 7.4 10*3/uL (ref 3.4–10.8)

## 2018-12-27 LAB — TSH: TSH: 0.444 u[IU]/mL — ABNORMAL LOW (ref 0.450–4.500)

## 2018-12-27 LAB — CK: Total CK: 89 U/L (ref 32–182)

## 2018-12-27 NOTE — Chronic Care Management (AMB) (Addendum)
°  Chronic Care Management   Outreach Note  12/27/2018 Name: [name] [name] MRN: [id number] DOB: 16-Dec-1940  Referred by: Jerrol Banana., MD Reason for referral : Chronic Care Management (CCM outreach )   An telephone outreach was attempted today, Mr. Lukacs asked me to call her back in a few days.The patient was referred to the case management team by for assistance with care management and care coordination.   Follow Up Plan: The care management team will reach out to the patient again over the next 7 days.   [place]  [email or web address]   ??[id number]

## 2018-12-28 ENCOUNTER — Telehealth: Payer: Self-pay

## 2018-12-28 NOTE — Telephone Encounter (Signed)
Left message to call back  

## 2018-12-28 NOTE — Telephone Encounter (Signed)
-----   Message from Jerrol Banana., MD sent at 12/28/2018 11:24 AM EDT ----- Mild bilateral hip arthritis

## 2018-12-28 NOTE — Telephone Encounter (Signed)
-----   Message from Jerrol Banana., MD sent at 12/28/2018 11:24 AM EDT ----- Labs in normal range.  May repeat TSH on next visit.

## 2018-12-28 NOTE — Telephone Encounter (Signed)
Advised patient of results. Patient reports that her pain is improving on the prednisone. She wanted to know if there is a maintenance medication she can take for arthritis? Please advise. Thanks!

## 2018-12-28 NOTE — Telephone Encounter (Signed)
Ok to try Tumeric daily OTC. We probably need to refer her to ortho for the knee.

## 2018-12-28 NOTE — Telephone Encounter (Signed)
-----   Message from Jerrol Banana., MD sent at 12/28/2018 11:25 AM EDT ----- Significant knee arthritis.

## 2018-12-29 DIAGNOSIS — H5052 Exophoria: Secondary | ICD-10-CM | POA: Diagnosis not present

## 2018-12-29 NOTE — Telephone Encounter (Signed)
Pt advised.   Thanks,   -Laura  

## 2019-01-01 ENCOUNTER — Ambulatory Visit: Payer: PPO

## 2019-01-01 ENCOUNTER — Telehealth: Payer: Self-pay

## 2019-01-01 NOTE — Telephone Encounter (Signed)
No show. Called patient and left a message pertaining to appointment and a reminder for the next follow up session. Return phone call requested. Phone number ([phone]) provided.   

## 2019-01-01 NOTE — Progress Notes (Signed)
Patient: [name] [name] Female    DOB: 1941-02-28   78 y.o.   MRN: [id number] Visit Date: 01/02/2019  Today's Provider: Wilhemena Durie, MD   Chief Complaint  Patient presents with  . Follow-up   Subjective:     HPI   Acute pain of right knee From 12/25/2018-given rx for prednisone taper. X-ray and labs obtained. Patient reports that it has improved.  She is walking better than a week ago and her energy level is also increased.  Hip pain From 12/25/2018-Likely OA. Labs and x-ray obtained.  She is feeling better.  Her GI symptoms have completely resolved.  Allergies  Allergen Reactions  . Celebrex  [Celecoxib]     Dark stools.  . Penicillins Other (See Comments)    Childhood reaction Has patient had a PCN reaction causing immediate rash, facial/tongue/throat swelling, SOB or lightheadedness with hypotension: Unknown Has patient had a PCN reaction causing severe rash involving mucus membranes or skin necrosis: Unknown Has patient had a PCN reaction that required hospitalization: Unknown Has patient had a PCN reaction occurring within the last 10 years: Unknown If all of the above answers are "NO", then may proceed with Cephalosporin use.   Marland Kitchen Prevacid [Lansoprazole] Diarrhea     Current Outpatient Medications:  .  Calcium Carb-Cholecalciferol (CALCIUM 500+D PO), Take by mouth every other day., Disp: , Rfl:  .  letrozole (FEMARA) 2.5 MG tablet, Take 2.5 mg by mouth daily., Disp: , Rfl:  .  lisinopril (ZESTRIL) 40 MG tablet, Take 1 tablet (40 mg total) by mouth daily. (Patient taking differently: Take 40 mg by mouth daily. Takes 20mg  daily), Disp: 90 tablet, Rfl: 3 .  predniSONE (DELTASONE) 5 MG tablet, Take 6 tablets day 1, 5 tablets day 2, 4 tablets day 3, 3 tablets day 4, 2 tablets day 5, 1 tablet day 6., Disp: 21 tablet, Rfl: 0  Review of Systems  Constitutional: Negative.   Eyes: Negative.   Respiratory: Negative.   Cardiovascular: Negative.    Gastrointestinal: Negative.   Endocrine: Negative.   Musculoskeletal: Positive for arthralgias and myalgias.  Allergic/Immunologic: Negative.   Neurological: Negative.   Hematological: Negative.   Psychiatric/Behavioral: Negative.     Social History   Tobacco Use  . Smoking status: Former Smoker    Packs/day: 0.25    Years: 10.00    Pack years: 2.50    Types: Cigarettes    Quit date: 03/06/1998    Years since quitting: 20.8  . Smokeless tobacco: Never Used  Substance Use Topics  . Alcohol use: No      Objective:   BP 130/68   Pulse 78   Temp 98 F (36.7 C)   Resp 16   Wt 134 lb (60.8 kg)   SpO2 99%   BMI 23.00 kg/m  Vitals:   01/02/19 1147  BP: 130/68  Pulse: 78  Resp: 16  Temp: 98 F (36.7 C)  SpO2: 99%  Weight: 134 lb (60.8 kg)  Body mass index is 23 kg/m.   Physical Exam Vitals signs reviewed.  Constitutional:      Appearance: She is well-developed.  HENT:     Head: Normocephalic and atraumatic.     Right Ear: External ear normal.     Left Ear: External ear normal.     Nose: Nose normal.  Eyes:     General: No scleral icterus.    Conjunctiva/sclera: Conjunctivae normal.  Neck:     Thyroid: No thyromegaly.  Cardiovascular:     Rate and Rhythm: Normal rate and regular rhythm.     Heart sounds: Normal heart sounds.  Pulmonary:     Effort: Pulmonary effort is normal.     Breath sounds: Normal breath sounds.  Abdominal:     Palpations: Abdomen is soft.  Musculoskeletal:     Right lower leg: No edema.     Left lower leg: No edema.  Skin:    General: Skin is warm and dry.  Neurological:     Mental Status: She is alert and oriented to person, place, and time. Mental status is at baseline.     Comments: Still with a little bit of a shuffling gait but her gait is much more normal than it was on her last visit.  Psychiatric:        Mood and Affect: Mood normal.        Behavior: Behavior normal.        Thought Content: Thought content normal.         Judgment: Judgment normal.      No results found for any visits on 01/02/19.     Assessment & Plan    1. Primary osteoarthritis of knee, unspecified laterality I am still concerned about fall risk.  She wishes to hold referral to neurology related to do this in the future.  2. Bradycardia Resolved.  Heart rate high 70s today  3. Malignant neoplasm of upper-inner quadrant of right breast in female, estrogen receptor positive ([place])   4. Gastrointestinal hemorrhage, unspecified gastrointestinal hemorrhage type Resolved More than 50% 25 minute visit spent in counseling or coordination of care  5. Atherosclerosis of coronary artery of native heart without angina pectoris, unspecified vessel or lesion type All risk factors treated     Wilhemena Durie, MD  [facility]

## 2019-01-02 ENCOUNTER — Other Ambulatory Visit: Payer: Self-pay

## 2019-01-02 ENCOUNTER — Ambulatory Visit (INDEPENDENT_AMBULATORY_CARE_PROVIDER_SITE_OTHER): Payer: PPO | Admitting: Family Medicine

## 2019-01-02 ENCOUNTER — Encounter: Payer: Self-pay | Admitting: Family Medicine

## 2019-01-02 VITALS — BP 130/68 | HR 78 | Temp 98.0°F | Resp 16 | Wt 134.0 lb

## 2019-01-02 DIAGNOSIS — I2510 Atherosclerotic heart disease of native coronary artery without angina pectoris: Secondary | ICD-10-CM | POA: Diagnosis not present

## 2019-01-02 DIAGNOSIS — M1710 Unilateral primary osteoarthritis, unspecified knee: Secondary | ICD-10-CM

## 2019-01-02 DIAGNOSIS — R001 Bradycardia, unspecified: Secondary | ICD-10-CM | POA: Diagnosis not present

## 2019-01-02 DIAGNOSIS — Z170 Estrogen receptor positive status [ER+]: Secondary | ICD-10-CM | POA: Diagnosis not present

## 2019-01-02 DIAGNOSIS — C50211 Malignant neoplasm of upper-inner quadrant of right female breast: Secondary | ICD-10-CM | POA: Diagnosis not present

## 2019-01-02 DIAGNOSIS — K922 Gastrointestinal hemorrhage, unspecified: Secondary | ICD-10-CM | POA: Diagnosis not present

## 2019-01-03 ENCOUNTER — Ambulatory Visit: Payer: PPO

## 2019-01-03 NOTE — Chronic Care Management (AMB) (Signed)
Chronic Care Management   Note  01/03/2019 Name: ZOHA SPRANGER MRN: [id number] DOB: [birth year]  Shary Key Skaff is a 78 y.o. year old female who is a primary care patient of Jerrol Banana., MD. I reached out to Joice by phone today in response to a referral sent by Ms. Shary Key Crum's health plan.     Ms. Kamphuis was given information about Chronic Care Management services today including:  1. CCM service includes personalized support from designated clinical staff supervised by her physician, including individualized plan of care and coordination with other care providers 2. 24/7 contact phone numbers for assistance for urgent and routine care needs. 3. Service will only be billed when office clinical staff spend 20 minutes or more in a month to coordinate care. 4. Only one practitioner may furnish and bill the service in a calendar month. 5. The patient may stop CCM services at any time (effective at the end of the month) by phone call to the office staff. 6. The patient will be responsible for cost sharing (co-pay) of up to 20% of the service fee (after annual deductible is met).  Patient did not agree to enrollment in care management services and does not wish to consider at this time.  Follow up plan: The patient has been provided with contact information for the chronic care management team and has been advised to call with any health related questions or concerns.   [place]  [email or web address]   ??[phone]

## 2019-01-04 ENCOUNTER — Telehealth: Payer: Self-pay | Admitting: Family Medicine

## 2019-01-04 NOTE — Telephone Encounter (Signed)
Pt called saying she was in Tuesday and seen Dr. Rosanna Randy.  She was told if she didn't feel better to call back.  She said she feels tired and really had a hard time explaining how she feels.  CB#  [id number]    teri

## 2019-01-04 NOTE — Telephone Encounter (Signed)
She is having urinary frequency more than usual denies any pain with urination. It all started last night. She would like to know what she can do she states that she feels better just have the frequent urination.

## 2019-01-04 NOTE — Telephone Encounter (Signed)
Please obtain Urine C and S due to her symptoms.

## 2019-01-08 NOTE — Telephone Encounter (Signed)
Patient has been notified. KW

## 2019-01-09 ENCOUNTER — Other Ambulatory Visit: Payer: Self-pay

## 2019-01-09 DIAGNOSIS — R350 Frequency of micturition: Secondary | ICD-10-CM

## 2019-01-09 NOTE — Progress Notes (Signed)
Patient brought in sample. Ok to order Urine c&s per Dr. Rosanna Randy.

## 2019-01-11 LAB — URINE CULTURE

## 2019-01-16 DIAGNOSIS — I119 Hypertensive heart disease without heart failure: Secondary | ICD-10-CM | POA: Diagnosis not present

## 2019-01-16 DIAGNOSIS — I739 Peripheral vascular disease, unspecified: Secondary | ICD-10-CM | POA: Diagnosis not present

## 2019-01-16 DIAGNOSIS — H532 Diplopia: Secondary | ICD-10-CM | POA: Diagnosis not present

## 2019-01-16 DIAGNOSIS — I2510 Atherosclerotic heart disease of native coronary artery without angina pectoris: Secondary | ICD-10-CM | POA: Diagnosis not present

## 2019-01-16 DIAGNOSIS — I10 Essential (primary) hypertension: Secondary | ICD-10-CM | POA: Diagnosis not present

## 2019-01-22 ENCOUNTER — Other Ambulatory Visit: Payer: PPO

## 2019-02-06 DIAGNOSIS — H532 Diplopia: Secondary | ICD-10-CM | POA: Diagnosis not present

## 2019-02-13 ENCOUNTER — Ambulatory Visit
Admission: RE | Admit: 2019-02-13 | Discharge: 2019-02-13 | Disposition: A | Discharge status: Home / Self Care | Payer: PPO | Source: Ambulatory Visit | Attending: Oncology | Admitting: Oncology

## 2019-02-13 DIAGNOSIS — Z13820 Encounter for screening for osteoporosis: Secondary | ICD-10-CM | POA: Diagnosis not present

## 2019-02-13 DIAGNOSIS — C50211 Malignant neoplasm of upper-inner quadrant of right female breast: Secondary | ICD-10-CM | POA: Insufficient documentation

## 2019-02-13 DIAGNOSIS — M8589 Other specified disorders of bone density and structure, multiple sites: Secondary | ICD-10-CM | POA: Insufficient documentation

## 2019-02-13 DIAGNOSIS — Z780 Asymptomatic menopausal state: Secondary | ICD-10-CM | POA: Diagnosis not present

## 2019-02-13 DIAGNOSIS — Z170 Estrogen receptor positive status [ER+]: Secondary | ICD-10-CM | POA: Insufficient documentation

## 2019-02-21 NOTE — Progress Notes (Signed)
Subjective:   Josie B Stouffer is a 78 y.o. female who presents for Medicare Annual (Subsequent) preventive examination.    This visit is being conducted through telemedicine due to the COVID-19 pandemic. This patient has given me verbal consent via doximity to conduct this visit, patient states they are participating from their home address. Some vital signs may be absent or patient reported.    Patient identification: identified by name, DOB, and current address Review of Systems:  N/A  Cardiac Risk Factors include: advanced age (>77men, >33 women);hypertension     Objective:     Vitals: There were no vitals taken for this visit.  There is no height or weight on file to calculate BMI. Unable to obtain vitals due to visit being conducted via telephonically.   Advanced Directives 02/22/2019 12/14/2018 11/29/2018 10/20/2018 09/10/2018 09/09/2018 09/06/2018  Does Patient Have a Medical Advance Directive? Yes Yes Yes Yes Yes Yes Yes  Type of Paramedic of [place];Living will [place];Living will Healthcare Power of [place];Living will [place];Living will [place];Living will [place];Living will  Does patient want to make changes to medical advance directive? - - No - Patient declined - No - Patient declined - -  Copy of [place] in Chart? No - copy requested No - copy requested - - Yes - validated most recent copy scanned in chart (See row information) - -  Would patient like information on creating a medical advance directive? - No - Patient declined - No - Patient declined No - Patient declined - -    Tobacco Social History   Tobacco Use  Smoking Status Former Smoker  . Packs/day: 0.25  . Years: 10.00  . Pack years: 2.50  . Types: Cigarettes  . Quit date: 03/06/1998  . 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  No  Did the patient bring in their glucometer from home?  No  How often do you monitor your CBG's? Does not check blood sugar.   Financial Strains and Diabetes Management:  Are you having any financial strains with the device, your supplies or your medication? No .  Does the patient want to be seen by Chronic Care Management for management of their diabetes?  No  Would the patient like to be referred to a Nutritionist or for Diabetic Management?  No   Diabetic Exams:  Diabetic Eye Exam: Completed 02/16/19. Repeat eye exam in 1 years.   Diabetic Foot Exam: Currently due. Pt has been advised about the importance in completing this exam. Note made to follow up on this at next in office visit.     Interpreter Needed?: No  Information entered by :: [facility], LPN  Past Medical History:  Diagnosis Date  . Breast cancer ([place]) 2018   Right breast  . 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 Mucin noted on biopsy, not on wide excision.  Marland Kitchen  Coronary artery disease   . GERD (gastroesophageal reflux disease)   . Hyperlipidemia   . Hypertension   . 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 Dr. Kellie Moor DX by punch bioppsy  . Lichen planus    right breast   . Lichen planus   . MI (myocardial infarction) ([place])    2015  . Personal history of radiation therapy 09/2016   RIGHT lumpectomy w/ radiation   Past Surgical History:  Procedure Laterality Date  . ABDOMINAL HYSTERECTOMY     . APPENDECTOMY    . BREAST BIOPSY Right 07/23/2016   INVASIVE MAMMARY CARCINOMA WITH AREAS OF EXTRACELLULAR MUCIN  . BREAST EXCISIONAL BIOPSY     INVASIVE MUCINOUS MAMMARY CARCINOMA.   Marland Kitchen BREAST LUMPECTOMY Right 08/20/2016   INVASIVE MUCINOUS MAMMARY CARCINOMA. /Grade 2   . COLONOSCOPY  2016  . COLONOSCOPY N/A 09/06/2018   Procedure: COLONOSCOPY;  Surgeon: Virgel Manifold, MD;  Location: [facility] ENDOSCOPY;  Service: Endoscopy;  Laterality: N/A;  . CORONARY ANGIOPLASTY WITH STENT PLACEMENT    . CYSTOSCOPY    . ESOPHAGOGASTRODUODENOSCOPY N/A 09/06/2018   Procedure: ESOPHAGOGASTRODUODENOSCOPY (EGD);  Surgeon: Virgel Manifold, MD;  Location: [facility] ENDOSCOPY;  Service: Endoscopy;  Laterality: N/A;  . OOPHORECTOMY    . PARTIAL MASTECTOMY WITH AXILLARY SENTINEL LYMPH NODE BIOPSY Right 08/09/2016   Procedure: PARTIAL MASTECTOMY WITH AXILLARY SENTINEL LYMPH NODE BIOPSY;  Surgeon: Robert Bellow, MD;  Location: [facility] ORS;  Service: General;  Laterality: Right;  . TONSILLECTOMY     Family History  Problem Relation Age of Onset  . Hyperlipidemia Mother   . Allergies Mother   . Cerebral aneurysm Mother        cause of death at age 56  . Heart disease Father        Fatal MI ag 36  . Cancer Brother        lung cancer  . Hyperlipidemia Brother   . Colonic polyp Brother   . Healthy Son   . Cancer - Lung Brother        colon  . Healthy Son   . Breast cancer Neg Hx    Social History   Socioeconomic History  . Marital status: Married    Spouse name: [name]  . Number of children: 2  . Years of education: Not on file  . Highest education level: High school graduate  Occupational History  . Occupation: retired  Tobacco Use  . Smoking status: Former Smoker    Packs/day: 0.25    Years: 10.00    Pack years: 2.50    Types: Cigarettes    Quit date: 03/06/1998    Years since quitting: 20.9  . Smokeless tobacco: Never Used  Substance and Sexual Activity  . Alcohol use: No  . Drug  use: No  . Sexual activity: Yes    Birth control/protection: Surgical  Other Topics Concern  . Not on file  Social History Narrative  . Not on file   Social Determinants of Health   Financial Resource Strain:   . Difficulty of Paying Living Expenses: Not on file  Food Insecurity:   . Worried About Charity fundraiser in the Last Year: Not on file  . Ran Out of Food in the Last Year: Not on file  Transportation Needs:   . Lack of Transportation (Medical): Not on file  . Lack of Transportation (Non-Medical): Not on file  Physical Activity:   . Days of Exercise  per Week: Not on file  . Minutes of Exercise per Session: Not on file  Stress:   . Feeling of Stress : Not on file  Social Connections:   . Frequency of Communication with Friends and Family: Not on file  . Frequency of Social Gatherings with Friends and Family: Not on file  . Attends Religious Services: Not on file  . Active Member of Clubs or Organizations: Not on file  . Attends Archivist Meetings: Not on file  . Marital Status: Not on file    Outpatient Encounter Medications as of 02/22/2019  Medication Sig  . Calcium Carb-Cholecalciferol (CALCIUM 500+D PO) Take by mouth every other day.  . letrozole (FEMARA) 2.5 MG tablet Take 2.5 mg by mouth daily.  Marland Kitchen lisinopril (ZESTRIL) 40 MG tablet Take 1 tablet (40 mg total) by mouth daily. (Patient taking differently: Take 40 mg by mouth daily. Takes 20mg  daily)  . meloxicam (MOBIC) 7.5 MG tablet Take 7.5 mg by mouth daily.   No facility-administered encounter medications on file as of 02/22/2019.    Activities of Daily Living In your present state of health, do you have any difficulty performing the following activities: 02/22/2019 09/10/2018  Hearing? N N  Vision? N N  Difficulty concentrating or making decisions? N N  Walking or climbing stairs? N N  Dressing or bathing? N N  Doing errands, shopping? N N  Preparing Food and eating ? N -  Using the Toilet?  N -  In the past six months, have you accidently leaked urine? N -  Do you have problems with loss of bowel control? N -  Managing your Medications? N -  Managing your Finances? N -  Housekeeping or managing your Housekeeping? N -  Some recent data might be hidden    Patient Care Team: Jerrol Banana., MD as PCP - General (Family Medicine) Corey Skains, MD as Consulting Physician (Cardiology) Odette Fraction as Consulting Physician (Optometry) Byrnett, [name], MD (General Surgery) Lloyd Huger, MD as Consulting Physician (Oncology) Pa, [place] (Optometry)    Assessment:   This is a routine wellness examination for Kayliee.  Exercise Activities and Dietary recommendations Current Exercise Habits: The patient does not participate in regular exercise at present, Exercise limited by: None identified  Goals    . Exercise      Recommend increasing exercise every week. Pt to start walking 3 times a week for at least 20 minutes.   02/21/18- Recommend to start exercising 3 days a week for at least 30 minutes at a time.        Fall Risk: Fall Risk  02/22/2019 05/09/2018 04/06/2018 02/21/2018 11/25/2016  Falls in the past year? 0 0 0 0 No  Number falls in past yr: 0 - - - -  Injury with Fall? 0 - - - -    FALL RISK PREVENTION PERTAINING TO THE HOME:  Any stairs in or around the home? No  If so, are there any without handrails? N/A  Home free of loose throw rugs in walkways, pet beds, electrical cords, etc? Yes  Adequate lighting in your home to reduce risk of falls? Yes   ASSISTIVE DEVICES UTILIZED TO PREVENT FALLS:  Life alert? No  Use of a cane, walker or w/c? Yes  Grab bars in the bathroom? Yes  Shower chair or bench in shower? No  Elevated toilet seat or a handicapped toilet? Yes   TIMED UP AND GO:  Was the test performed? No .    Depression Screen PHQ 2/9 Scores 02/22/2019 04/06/2018 02/21/2018 02/21/2018  PHQ - 2 Score 0 0 0 0   PHQ- 9 Score - - 1 -     Cognitive Function     6CIT Screen 02/22/2019 06/30/2016  What Year? 0 points 0 points  What month? 0 points 0 points  What time? 0 points 0 points  Count back from 20 0 points 0 points  Months in reverse 4 points 0 points  Repeat phrase 4 points 2 points  Total Score 8 2    Immunization History  Administered Date(s) Administered  . Influenza, High Dose Seasonal PF 02/06/2014, 01/20/2017, 02/21/2018  . Influenza,inj,Quad PF,6+ Mos 03/05/2013  . Pneumococcal Conjugate-13 02/06/2014  . Pneumococcal Polysaccharide-23 07/04/2012  . Tdap 07/04/2012  . Zoster 07/04/2012    Qualifies for Shingles Vaccine? Yes  Zostavax completed 07/04/12. Due for Shingrix. Pt has been advised to call insurance company to determine out of pocket expense. Advised may also receive vaccine at local pharmacy or Health Dept. Verbalized acceptance and understanding.  Tdap: Up to date  Flu Vaccine: Due for Flu vaccine. Does the patient want to receive this vaccine today?  No .   Pneumococcal Vaccine: Completed series  Screening Tests Health Maintenance  Topic Date Due  . FOOT EXAM  11/17/1950  . INFLUENZA VACCINE  10/07/2018  . HEMOGLOBIN A1C  03/07/2019  . COLONOSCOPY  09/06/2019  . OPHTHALMOLOGY EXAM  02/16/2020  . TETANUS/TDAP  07/05/2022  . DEXA SCAN  02/13/2024  . PNA vac Low Risk Adult  Completed    Cancer Screenings:  Colorectal Screening: Completed 09/06/18. Repeat every year.  Mammogram: Completed 05/03/18. Repeat as advised every 1-2 years.  Bone Density: Completed 02/13/19. Results reflect OSTEOPENIA. Repeat every 5 years.   Lung Cancer Screening: (Low Dose CT Chest recommended if Age 67-80 years, 30 pack-year currently smoking OR have quit w/in 15years.) does not qualify.   Additional Screening:  Dental Screening: Recommended annual dental exams for proper oral hygiene   Community Resource Referral:  CRR required this visit?  No       Plan:  I have  personally reviewed and addressed the Medicare Annual Wellness questionnaire and have noted the following in the patient's chart:  A. Medical and social history B. Use of alcohol, tobacco or illicit drugs  C. Current medications and supplements D. Functional ability and status E.  Nutritional status F.  Physical activity G. Advance directives H. List of other physicians I.  Hospitalizations, surgeries, and ER visits in previous 12 months J.  [place] such as hearing and vision if needed, cognitive and depression L. Referrals and appointments   In addition, I have reviewed and discussed with patient certain preventive protocols, quality metrics, and best practice recommendations. A written personalized care plan for preventive services as well as general preventive health recommendations were provided to patient.   [place] Score, [place]  [id number] Nurse Health Advisor   Nurse Notes: Pt needs a diabetic foot exam and influenza vaccine at next in office visit.

## 2019-02-22 ENCOUNTER — Ambulatory Visit (INDEPENDENT_AMBULATORY_CARE_PROVIDER_SITE_OTHER): Payer: PPO

## 2019-02-22 ENCOUNTER — Other Ambulatory Visit: Payer: Self-pay

## 2019-02-22 DIAGNOSIS — Z0000 Encounter for general adult medical examination without abnormal findings: Secondary | ICD-10-CM

## 2019-02-22 NOTE — Patient Instructions (Signed)
[name] [name] , Thank you for taking time to come for your Medicare Wellness Visit. I appreciate your ongoing commitment to your health goals. Please review the following plan we discussed and let me know if I can assist you in the future.   Screening recommendations/referrals: Colonoscopy: Up to date, due 09/2019 Mammogram: No longer required.  Bone Density: Up to date, due 02/2024 Recommended yearly ophthalmology/optometry visit for glaucoma screening and checkup Recommended yearly dental visit for hygiene and checkup  Vaccinations: Influenza vaccine: Currently due. Pt to receive at next in office apt.  Pneumococcal vaccine: Completed series Tdap vaccine: Up to date, due 06/2022 Shingles vaccine: Pt declines today.     Advanced directives: Please bring a copy of your POA (Power of Attorney) and/or Living Will to your next appointment.   Conditions/risks identified: Recommend to start walking 3 days a week for at least 30 minutes at a time.   Next appointment: 03/21/19 @ 10:00 AM with Dr Rosanna Randy. Declined scheduling an AWV for 2021 at this time.   Preventive Care 80 Years and Older, Female Preventive care refers to lifestyle choices and visits with your health care provider that can promote health and wellness. What does preventive care include?  A yearly physical exam. This is also called an annual well check.  Dental exams once or twice a year.  Routine eye exams. Ask your health care provider how often you should have your eyes checked.  Personal lifestyle choices, including:  Daily care of your teeth and gums.  Regular physical activity.  Eating a healthy diet.  Avoiding tobacco and drug use.  Limiting alcohol use.  Practicing safe sex.  Taking low-dose aspirin every day.  Taking vitamin and mineral supplements as recommended by your health care provider. What happens during an annual well check? The services and screenings done by your health care provider during  your annual well check will depend on your age, overall health, lifestyle risk factors, and family history of disease. Counseling  Your health care provider may ask you questions about your:  Alcohol use.  Tobacco use.  Drug use.  Emotional well-being.  Home and relationship well-being.  Sexual activity.  Eating habits.  History of falls.  Memory and ability to understand (cognition).  Work and work Statistician.  Reproductive health. Screening  You may have the following tests or measurements:  Height, weight, and BMI.  Blood pressure.  Lipid and cholesterol levels. These may be checked every 5 years, or more frequently if you are over 2 years old.  Skin check.  Lung cancer screening. You may have this screening every year starting at age 80 if you have a 30-pack-year history of smoking and currently smoke or have quit within the past 15 years.  Fecal occult blood test (FOBT) of the stool. You may have this test every year starting at age 80.  Flexible sigmoidoscopy or colonoscopy. You may have a sigmoidoscopy every 5 years or a colonoscopy every 10 years starting at age 50.  Hepatitis C blood test.  Hepatitis B blood test.  Sexually transmitted disease (STD) testing.  Diabetes screening. This is done by checking your blood sugar (glucose) after you have not eaten for a while (fasting). You may have this done every 1-3 years.  Bone density scan. This is done to screen for osteoporosis. You may have this done starting at age 95.  Mammogram. This may be done every 1-2 years. Talk to your health care provider about how often you should have  regular mammograms. Talk with your health care provider about your test results, treatment options, and if necessary, the need for more tests. Vaccines  Your health care provider may recommend certain vaccines, such as:  Influenza vaccine. This is recommended every year.  Tetanus, diphtheria, and acellular pertussis (Tdap,  Td) vaccine. You may need a Td booster every 10 years.  Zoster vaccine. You may need this after age 15.  Pneumococcal 13-valent conjugate (PCV13) vaccine. One dose is recommended after age 33.  Pneumococcal polysaccharide (PPSV23) vaccine. One dose is recommended after age 40. Talk to your health care provider about which screenings and vaccines you need and how often you need them. This information is not intended to replace advice given to you by your health care provider. Make sure you discuss any questions you have with your health care provider. Document Released: 03/21/2015 Document Revised: 11/12/2015 Document Reviewed: 12/24/2014 Elsevier Interactive Patient Education  2017 [place] Prevention in the Home Falls can cause injuries. They can happen to people of all ages. There are many things you can do to make your home safe and to help prevent falls. What can I do on the outside of my home?  Regularly fix the edges of walkways and driveways and fix any cracks.  Remove anything that might make you trip as you walk through a door, such as a raised step or threshold.  Trim any bushes or trees on the path to your home.  Use bright outdoor lighting.  Clear any walking paths of anything that might make someone trip, such as rocks or tools.  Regularly check to see if handrails are loose or broken. Make sure that both sides of any steps have handrails.  Any raised decks and porches should have guardrails on the edges.  Have any leaves, snow, or ice cleared regularly.  Use sand or salt on walking paths during winter.  Clean up any spills in your garage right away. This includes oil or grease spills. What can I do in the bathroom?  Use night lights.  Install grab bars by the toilet and in the tub and shower. Do not use towel bars as grab bars.  Use non-skid mats or decals in the tub or shower.  If you need to sit down in the shower, use a plastic, non-slip  stool.  Keep the floor dry. Clean up any water that spills on the floor as soon as it happens.  Remove soap buildup in the tub or shower regularly.  Attach bath mats securely with double-sided non-slip rug tape.  Do not have throw rugs and other things on the floor that can make you trip. What can I do in the bedroom?  Use night lights.  Make sure that you have a light by your bed that is easy to reach.  Do not use any sheets or blankets that are too big for your bed. They should not hang down onto the floor.  Have a firm chair that has side arms. You can use this for support while you get dressed.  Do not have throw rugs and other things on the floor that can make you trip. What can I do in the kitchen?  Clean up any spills right away.  Avoid walking on wet floors.  Keep items that you use a lot in easy-to-reach places.  If you need to reach something above you, use a strong step stool that has a grab bar.  Keep electrical cords out of the  way.  Do not use floor polish or wax that makes floors slippery. If you must use wax, use non-skid floor wax.  Do not have throw rugs and other things on the floor that can make you trip. What can I do with my stairs?  Do not leave any items on the stairs.  Make sure that there are handrails on both sides of the stairs and use them. Fix handrails that are broken or loose. Make sure that handrails are as long as the stairways.  Check any carpeting to make sure that it is firmly attached to the stairs. Fix any carpet that is loose or worn.  Avoid having throw rugs at the top or bottom of the stairs. If you do have throw rugs, attach them to the floor with carpet tape.  Make sure that you have a light switch at the top of the stairs and the bottom of the stairs. If you do not have them, ask someone to add them for you. What else can I do to help prevent falls?  Wear shoes that:  Do not have high heels.  Have rubber bottoms.  Are  comfortable and fit you well.  Are closed at the toe. Do not wear sandals.  If you use a stepladder:  Make sure that it is fully opened. Do not climb a closed stepladder.  Make sure that both sides of the stepladder are locked into place.  Ask someone to hold it for you, if possible.  Clearly mark and make sure that you can see:  Any grab bars or handrails.  First and last steps.  Where the edge of each step is.  Use tools that help you move around (mobility aids) if they are needed. These include:  Canes.  Walkers.  Scooters.  Crutches.  Turn on the lights when you go into a dark area. Replace any light bulbs as soon as they burn out.  Set up your furniture so you have a clear path. Avoid moving your furniture around.  If any of your floors are uneven, fix them.  If there are any pets around you, be aware of where they are.  Review your medicines with your doctor. Some medicines can make you feel dizzy. This can increase your chance of falling. Ask your doctor what other things that you can do to help prevent falls. This information is not intended to replace advice given to you by your health care provider. Make sure you discuss any questions you have with your health care provider. Document Released: 12/19/2008 Document Revised: 07/31/2015 Document Reviewed: 03/29/2014 Elsevier Interactive Patient Education  2017 [employer].

## 2019-02-27 DIAGNOSIS — M25561 Pain in right knee: Secondary | ICD-10-CM | POA: Diagnosis not present

## 2019-02-27 DIAGNOSIS — M1711 Unilateral primary osteoarthritis, right knee: Secondary | ICD-10-CM | POA: Diagnosis not present

## 2019-02-27 DIAGNOSIS — M25461 Effusion, right knee: Secondary | ICD-10-CM | POA: Diagnosis not present

## 2019-03-07 DIAGNOSIS — M1711 Unilateral primary osteoarthritis, right knee: Secondary | ICD-10-CM | POA: Diagnosis not present

## 2019-03-07 DIAGNOSIS — M25561 Pain in right knee: Secondary | ICD-10-CM | POA: Diagnosis not present

## 2019-03-15 DIAGNOSIS — M1711 Unilateral primary osteoarthritis, right knee: Secondary | ICD-10-CM | POA: Diagnosis not present

## 2019-03-15 DIAGNOSIS — M25561 Pain in right knee: Secondary | ICD-10-CM | POA: Diagnosis not present

## 2019-03-21 ENCOUNTER — Ambulatory Visit: Payer: PPO | Admitting: Family Medicine

## 2019-03-28 DIAGNOSIS — M25561 Pain in right knee: Secondary | ICD-10-CM | POA: Diagnosis not present

## 2019-03-28 DIAGNOSIS — M1711 Unilateral primary osteoarthritis, right knee: Secondary | ICD-10-CM | POA: Diagnosis not present

## 2019-03-29 ENCOUNTER — Ambulatory Visit: Payer: PPO | Admitting: Family Medicine

## 2019-04-02 ENCOUNTER — Other Ambulatory Visit: Payer: Self-pay | Admitting: Oncology

## 2019-04-03 NOTE — Progress Notes (Signed)
Patient: [name] [name] Female    DOB: 1940-11-30   79 y.o.   MRN: [id number] Visit Date: 04/04/2019  Today's Provider: Wilhemena Durie, MD   Chief Complaint  Patient presents with   Diabetes Mellitus   Flu Vaccine   Subjective:     HPI   Patient presents today for diabetes f/u. Patient also having some pain in right breast, patient says that she had a breast biopsy a few years ago. RUQ tenderness of right breast. Anemia stable in recent months. Essential (primary) hypertension From 12/25/2018-increased Lisinopril dose from 20 to 40mg .   Parkinson disease ([place]) From 12/25/2018-She has Parkinsons stigmata --referred to Neurology.  Arteriosclerosis of coronary artery From 12/25/2018-Risk factors treated.  Hypercholesteremia From 12/25/2018-no changes were made.   Type 2 diabetes mellitus without complication, unspecified whether long term insulin use ([place]) From 12/25/2018-no changes were made.    Allergies  Allergen Reactions   Celebrex  [Celecoxib]     Dark stools.   Penicillins Other (See Comments)    Childhood reaction Has patient had a PCN reaction causing immediate rash, facial/tongue/throat swelling, SOB or lightheadedness with hypotension: Unknown Has patient had a PCN reaction causing severe rash involving mucus membranes or skin necrosis: Unknown Has patient had a PCN reaction that required hospitalization: Unknown Has patient had a PCN reaction occurring within the last 10 years: Unknown If all of the above answers are "NO", then may proceed with Cephalosporin use.    Prevacid [Lansoprazole] Diarrhea     Current Outpatient Medications:    Calcium Carb-Cholecalciferol (CALCIUM 500+D PO), Take by mouth every other day., Disp: , Rfl:    letrozole (FEMARA) 2.5 MG tablet, Take 1 tablet by mouth daily, Disp: 90 tablet, Rfl: 0   lisinopril (ZESTRIL) 40 MG tablet, Take 1 tablet (40 mg total) by mouth daily. (Patient taking differently:  Take 40 mg by mouth daily. Takes 20mg  daily), Disp: 90 tablet, Rfl: 3   meloxicam (MOBIC) 7.5 MG tablet, Take 7.5 mg by mouth daily., Disp: , Rfl:   Review of Systems  Constitutional: Negative for appetite change, chills, fatigue and fever.  HENT: Negative.   Eyes: Negative.   Respiratory: Negative for chest tightness and shortness of breath.   Cardiovascular: Negative for chest pain and palpitations.  Gastrointestinal: Negative for abdominal pain, nausea and vomiting.  Endocrine: Negative.   Allergic/Immunologic: Negative.   Neurological: Negative for dizziness and weakness.  Psychiatric/Behavioral: Negative.     Social History   Tobacco Use   Smoking status: Former Smoker    Packs/day: 0.25    Years: 10.00    Pack years: 2.50    Types: Cigarettes    Quit date: 03/06/1998    Years since quitting: 21.0   Smokeless tobacco: Never Used  Substance Use Topics   Alcohol use: No      Objective:   BP (!) 146/74 (BP Location: Right Arm, Patient Position: Sitting, Cuff Size: Normal)    Pulse 88    Temp (!) 96.8 F (36 C) (Other (Comment))    Wt 130 lb 9.6 oz (59.2 kg)    BMI 22.42 kg/m  Vitals:   04/04/19 1637  BP: (!) 146/74  Pulse: 88  Temp: (!) 96.8 F (36 C)  TempSrc: Other (Comment)  Weight: 130 lb 9.6 oz (59.2 kg)  Body mass index is 22.42 kg/m.   Physical Exam Vitals reviewed.  Constitutional:      Appearance: She is well-developed.  HENT:  Head: Normocephalic and atraumatic.     Right Ear: External ear normal.     Left Ear: External ear normal.     Nose: Nose normal.  Eyes:     General: No scleral icterus.    Conjunctiva/sclera: Conjunctivae normal.  Neck:     Thyroid: No thyromegaly.  Cardiovascular:     Rate and Rhythm: Normal rate and regular rhythm.     Heart sounds: Normal heart sounds.  Pulmonary:     Effort: Pulmonary effort is normal.     Breath sounds: Normal breath sounds.  Chest:     Breasts:        Left: Normal.    Abdominal:       Palpations: Abdomen is soft.  Musculoskeletal:     Right lower leg: No edema.     Left lower leg: No edema.  Lymphadenopathy:     Cervical: No cervical adenopathy.  Skin:    General: Skin is warm and dry.  Neurological:     General: No focal deficit present.     Mental Status: She is alert and oriented to person, place, and time.     Comments: Still with a little bit of a shuffling gait but her gait is much more normal than it was on her last visit.  Psychiatric:        Mood and Affect: Mood normal.        Behavior: Behavior normal.        Thought Content: Thought content normal.        Judgment: Judgment normal.      Results for orders placed or performed in visit on 04/04/19  POCT glycosylated hemoglobin (Hb A1C)  Result Value Ref Range   Hemoglobin A1C 5.6 4.0 - 5.6 %   Est. average glucose Bld gHb Est-mCnc 114        Assessment & Plan    1. Type 2 diabetes mellitus without complication, unspecified whether long term insulin use ([facility])  - POCT glycosylated hemoglobin (Hb A1C) 5.6.  2. Breast thickening in pt with h/o breast cancer This is probably benighn but will refer back to [name] [name]. - Ambulatory referral to General Surgery  3. Need for influenza vaccination  - Flu Vaccine QUAD High Dose(Fluad)   4. Diverticulosis of large intestine without diverticulitis   5. Essential (primary) hypertension   6. Arteriosclerosis of coronary artery Check CBC  7. Lower GI bleeding Clinically resolved.  8. Malignant neoplasm of upper-inner quadrant of right breast in female, estrogen receptor positive ([place])   Follow up in 3 months.   I,[name] [name],acting as a scribe for [name] Durie, MD.,have documented all relevant documentation on the behalf of [name] Durie, MD,as directed by  [name] Durie, MD while in the presence of [name] Durie, MD.      [name] Durie, MD  [facility]

## 2019-04-04 ENCOUNTER — Other Ambulatory Visit: Payer: Self-pay

## 2019-04-04 ENCOUNTER — Ambulatory Visit (INDEPENDENT_AMBULATORY_CARE_PROVIDER_SITE_OTHER): Payer: PPO | Admitting: Family Medicine

## 2019-04-04 ENCOUNTER — Encounter: Payer: Self-pay | Admitting: Family Medicine

## 2019-04-04 VITALS — BP 146/74 | HR 88 | Temp 96.8°F | Wt 130.6 lb

## 2019-04-04 DIAGNOSIS — N6459 Other signs and symptoms in breast: Secondary | ICD-10-CM

## 2019-04-04 DIAGNOSIS — Z170 Estrogen receptor positive status [ER+]: Secondary | ICD-10-CM

## 2019-04-04 DIAGNOSIS — K922 Gastrointestinal hemorrhage, unspecified: Secondary | ICD-10-CM

## 2019-04-04 DIAGNOSIS — I2510 Atherosclerotic heart disease of native coronary artery without angina pectoris: Secondary | ICD-10-CM

## 2019-04-04 DIAGNOSIS — I10 Essential (primary) hypertension: Secondary | ICD-10-CM | POA: Diagnosis not present

## 2019-04-04 DIAGNOSIS — E119 Type 2 diabetes mellitus without complications: Secondary | ICD-10-CM

## 2019-04-04 DIAGNOSIS — C50211 Malignant neoplasm of upper-inner quadrant of right female breast: Secondary | ICD-10-CM

## 2019-04-04 DIAGNOSIS — Z23 Encounter for immunization: Secondary | ICD-10-CM

## 2019-04-04 DIAGNOSIS — K5730 Diverticulosis of large intestine without perforation or abscess without bleeding: Secondary | ICD-10-CM

## 2019-04-04 LAB — POCT GLYCOSYLATED HEMOGLOBIN (HGB A1C)
Est. average glucose Bld gHb Est-mCnc: 114
Hemoglobin A1C: 5.6 % (ref 4.0–5.6)

## 2019-04-05 DIAGNOSIS — M25561 Pain in right knee: Secondary | ICD-10-CM | POA: Diagnosis not present

## 2019-04-05 DIAGNOSIS — M1711 Unilateral primary osteoarthritis, right knee: Secondary | ICD-10-CM | POA: Diagnosis not present

## 2019-04-12 DIAGNOSIS — M1711 Unilateral primary osteoarthritis, right knee: Secondary | ICD-10-CM | POA: Diagnosis not present

## 2019-04-12 DIAGNOSIS — M25561 Pain in right knee: Secondary | ICD-10-CM | POA: Diagnosis not present

## 2019-04-16 ENCOUNTER — Ambulatory Visit: Payer: PPO | Attending: Internal Medicine

## 2019-04-16 DIAGNOSIS — Z23 Encounter for immunization: Secondary | ICD-10-CM

## 2019-04-16 NOTE — Progress Notes (Signed)
   Covid-19 Vaccination Clinic  Name:  [name] [name]    MRN: [id number] DOB: October 25, 1940  04/16/2019  [name] [name] was observed post Covid-19 immunization for 15 minutes without incidence. She was provided with Vaccine Information Sheet and instruction to access the V-Safe system.   [name] [name] was instructed to call 911 with any severe reactions post vaccine: Marland Kitchen Difficulty breathing  . Swelling of your face and throat  . A fast heartbeat  . A bad rash all over your body  . Dizziness and weakness    Immunizations Administered    Name Date Dose VIS Date Route   Moderna COVID-19 Vaccine 04/16/2019  2:40 PM 0.5 mL 02/06/2019 Intramuscular   Manufacturer: Moderna   Lot: [id number]   [place]:[id number]

## 2019-04-19 DIAGNOSIS — M25561 Pain in right knee: Secondary | ICD-10-CM | POA: Diagnosis not present

## 2019-04-25 DIAGNOSIS — M25461 Effusion, right knee: Secondary | ICD-10-CM | POA: Diagnosis not present

## 2019-04-25 DIAGNOSIS — M25561 Pain in right knee: Secondary | ICD-10-CM | POA: Diagnosis not present

## 2019-04-28 IMAGING — MG MM BREAST SURGICAL SPECIMEN
1 series · 1 of 1 positions shown · non-contrast
Comparison: Previous exam(s).

CLINICAL DATA: Surgical specimen from right breast lumpectomy.

EXAM:
SPECIMEN RADIOGRAPH OF THE RIGHT BREAST

[R SPECIMEN]
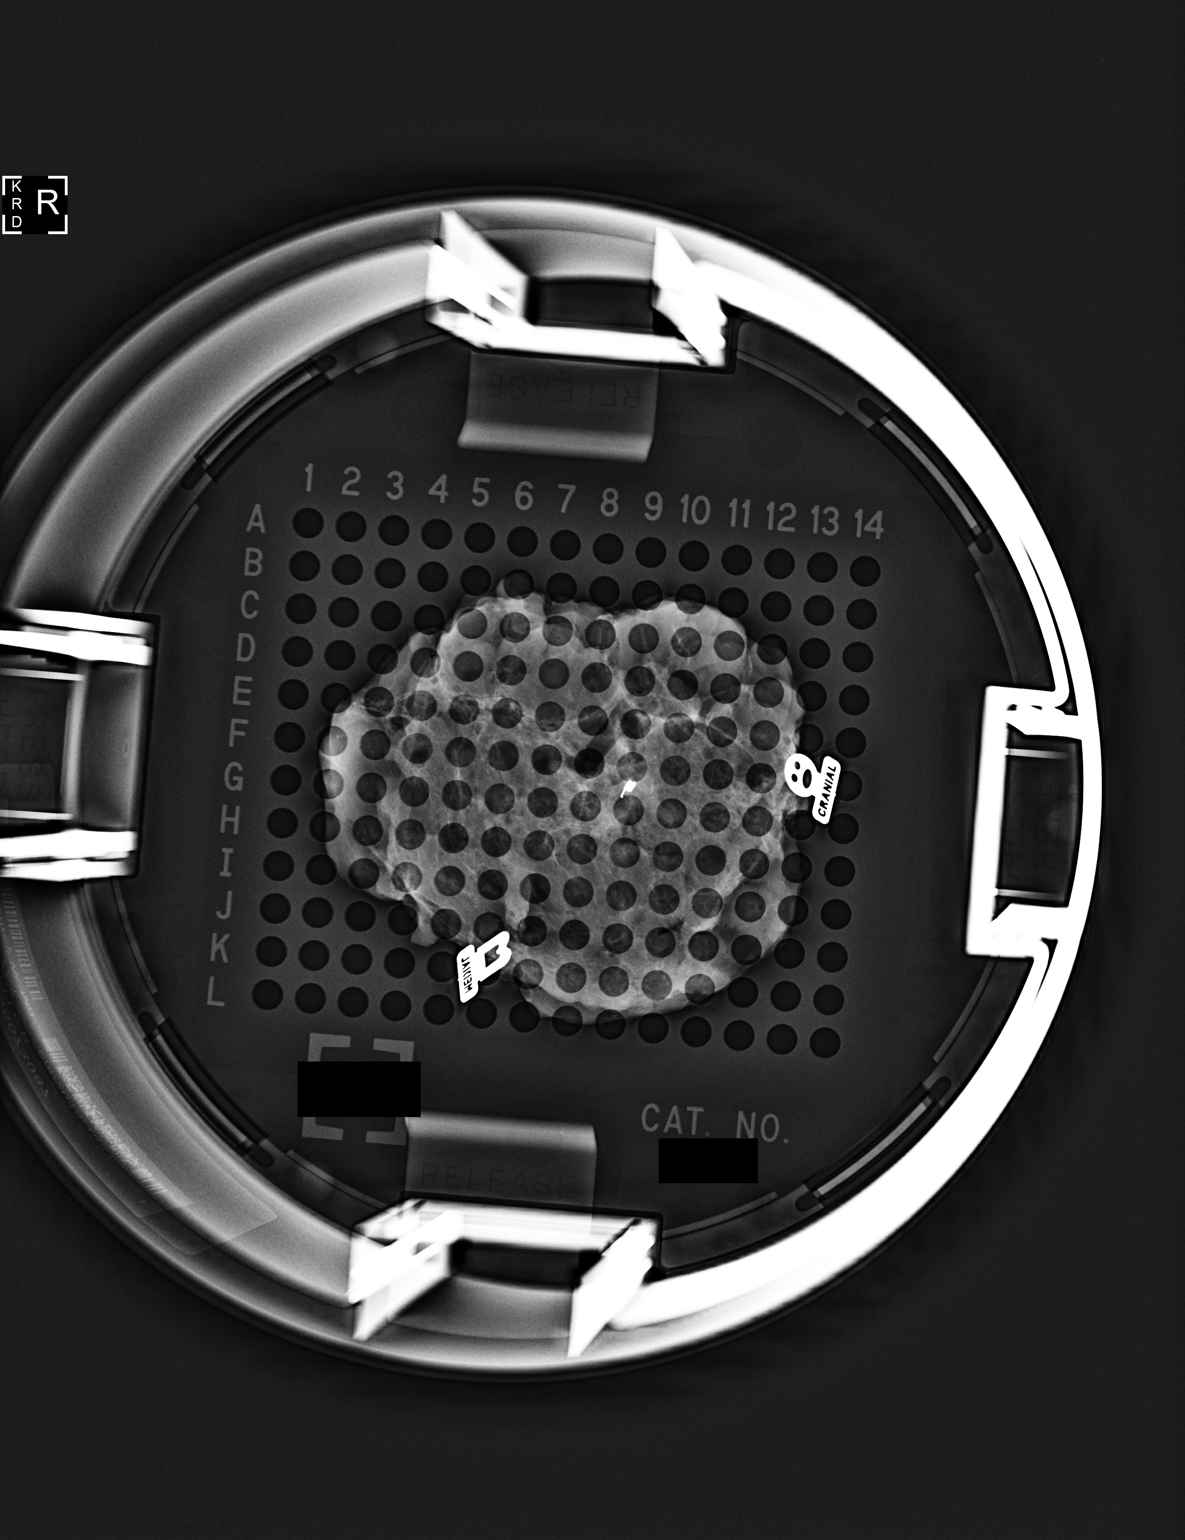

[1 of 1 positions shown; findings below may reference images not displayed]

FINDINGS: Status post excision of the right breast. The coil shaped biopsy
marker clip is present centrally in the specimen at F 9.
IMPRESSION: Specimen radiograph of the right breast.

## 2019-05-03 DIAGNOSIS — M1711 Unilateral primary osteoarthritis, right knee: Secondary | ICD-10-CM | POA: Diagnosis not present

## 2019-05-03 DIAGNOSIS — M25561 Pain in right knee: Secondary | ICD-10-CM | POA: Diagnosis not present

## 2019-05-05 NOTE — Progress Notes (Signed)
  [place]  Telephone:(336) [phone] [phone]) [phone]  ID: KIYRA FAUROT OB: 01-05-1941  MR#: [id number]  [id number]  Patient Care Team: Jerrol Banana., MD as PCP - General (Family Medicine) Corey Skains, MD as Consulting Physician (Cardiology) Odette Fraction as Consulting Physician (Optometry) Byrnett, [name], MD (General Surgery) Lloyd Huger, MD as Consulting Physician (Oncology) [place], [facility] (Optometry)   Lloyd Huger, MD   05/09/2019 6:28 AM     This encounter was created in error - please disregard.

## 2019-05-07 ENCOUNTER — Other Ambulatory Visit: Payer: PPO

## 2019-05-07 ENCOUNTER — Other Ambulatory Visit: Payer: Self-pay

## 2019-05-08 ENCOUNTER — Inpatient Hospital Stay: Payer: PPO | Admitting: Oncology

## 2019-05-11 NOTE — Progress Notes (Deleted)
       Patient: [name] [name] Female    DOB: [birth year]   79 y.o.   MRN: [id number] Visit Date: 05/11/2019  Today's Provider: Wilhemena Durie, MD   No chief complaint on file.  Subjective:     HPI  Allergies  Allergen Reactions  . Celebrex  [Celecoxib]     Dark stools.  . Penicillins Other (See Comments)    Childhood reaction Has patient had a PCN reaction causing immediate rash, facial/tongue/throat swelling, SOB or lightheadedness with hypotension: Unknown Has patient had a PCN reaction causing severe rash involving mucus membranes or skin necrosis: Unknown Has patient had a PCN reaction that required hospitalization: Unknown Has patient had a PCN reaction occurring within the last 10 years: Unknown If all of the above answers are "NO", then may proceed with Cephalosporin use.   Marland Kitchen Prevacid [Lansoprazole] Diarrhea     Current Outpatient Medications:  .  Calcium Carb-Cholecalciferol (CALCIUM 500+D PO), Take by mouth every other day., Disp: , Rfl:  .  letrozole (FEMARA) 2.5 MG tablet, Take 1 tablet by mouth daily, Disp: 90 tablet, Rfl: 0 .  lisinopril (ZESTRIL) 40 MG tablet, Take 1 tablet (40 mg total) by mouth daily. (Patient taking differently: Take 40 mg by mouth daily. Takes 20mg  daily), Disp: 90 tablet, Rfl: 3 .  meloxicam (MOBIC) 7.5 MG tablet, Take 7.5 mg by mouth daily., Disp: , Rfl:   Review of Systems  Constitutional: Negative for appetite change, chills, fatigue and fever.  Respiratory: Negative for chest tightness and shortness of breath.   Cardiovascular: Negative for chest pain and palpitations.  Gastrointestinal: Negative for abdominal pain, nausea and vomiting.  Neurological: Negative for dizziness and weakness.    Social History   Tobacco Use  . Smoking status: Former Smoker    Packs/day: 0.25    Years: 10.00    Pack years: 2.50    Types: Cigarettes    Quit date: 03/06/1998    Years since quitting: 21.1  . Smokeless tobacco: Never Used    Substance Use Topics  . Alcohol use: No      Objective:   There were no vitals taken for this visit. There were no vitals filed for this visit.There is no height or weight on file to calculate BMI.   Physical Exam   No results found for any visits on 05/14/19.     Assessment & Plan        Wilhemena Durie, MD  [facility]

## 2019-05-14 ENCOUNTER — Ambulatory Visit: Payer: Self-pay | Admitting: Family Medicine

## 2019-05-16 ENCOUNTER — Ambulatory Visit: Payer: PPO | Attending: Internal Medicine

## 2019-05-16 DIAGNOSIS — Z23 Encounter for immunization: Secondary | ICD-10-CM

## 2019-05-16 NOTE — Progress Notes (Signed)
   Covid-19 Vaccination Clinic  Name:  [name] [name]    MRN: [id number] DOB: [birth year]  05/16/2019  [name] was observed post Covid-19 immunization for 15 minutes without incident. She was provided with Vaccine Information Sheet and instruction to access the V-Safe system.   [name] was instructed to call 911 with any severe reactions post vaccine: Marland Kitchen Difficulty breathing  . Swelling of face and throat  . A fast heartbeat  . A bad rash all over body  . Dizziness and weakness   Immunizations Administered    Name Date Dose VIS Date Route   Moderna COVID-19 Vaccine 05/16/2019  1:06 PM 0.5 mL 02/06/2019 Intramuscular   Manufacturer: Moderna   Lot: [id number]   [id number]

## 2019-05-25 NOTE — Progress Notes (Signed)
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  INTERVAL HISTORY: Patient returns to clinic today for routine 66-monthevaluation.  She has no further episodes of GI bleeds.  She currently feels well and is asymptomatic.  She continues to tolerate letrozole without significant side effects.  She has no neurologic complaints. She denies any recent fevers or illnesses. She has a good appetite and denies weight loss.  She denies any chest pain, shortness of breath, cough, or hemoptysis.  She denies any nausea, vomiting, constipation, or diarrhea.  She denies any melena or hematochezia.  She has no urinary complaints.  Patient offers no specific complaints today.  REVIEW OF SYSTEMS:   Review of Systems  Constitutional: Negative.  Negative for fever, malaise/fatigue and weight loss.  Respiratory: Negative.  Negative for cough and shortness of breath.   Cardiovascular: Negative.  Negative for chest pain and leg swelling.  Gastrointestinal: Negative.  Negative for abdominal pain and constipation.  Genitourinary: Negative.  Negative for dysuria.  Musculoskeletal: Negative.  Negative for back pain.  Skin: Negative.  Negative for rash.  Neurological: Negative.  Negative for dizziness, sensory change, focal weakness and weakness.  Psychiatric/Behavioral: Negative.  The patient is not nervous/anxious.     As per HPI. Otherwise, a complete  review of systems is negative.  PAST MEDICAL HISTORY: Past Medical History:  Diagnosis Date  . Breast cancer ([place] 2018   Right breast  . Cancer ([place] 07/23/2016   T1b, N0; ER/PR+, 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 Mucin noted on biopsy, not on wide excision.  . Coronary artery disease   . GERD (gastroesophageal reflux disease)   . Hyperlipidemia   . Hypertension   . Lichen planus 040/[id number]/3532  Right breast in field of whole breast radiation. Dr. IKellie MoorDX by punch bioppsy  . Lichen planus    right breast   . Lichen planus   . [place]    [place]  . Personal history of radiation therapy 09/2016   RIGHT lumpectomy w/ radiation    PAST SURGICAL HISTORY: Past Surgical History:  Procedure Laterality Date  . ABDOMINAL HYSTERECTOMY    . APPENDECTOMY    . BREAST BIOPSY Right 07/23/2016   INVASIVE MAMMARY CARCINOMA WITH AREAS OF EXTRACELLULAR MUCIN  . BREAST EXCISIONAL BIOPSY     INVASIVE MUCINOUS MAMMARY CARCINOMA.   .Marland KitchenBREAST LUMPECTOMY Right 08/20/2016   INVASIVE MUCINOUS MAMMARY CARCINOMA. /Grade 2   . COLONOSCOPY  2016  . COLONOSCOPY N/A 09/06/2018   Procedure: COLONOSCOPY;  Surgeon: TVirgel Manifold MD;  Location: [facility] ENDOSCOPY;  Service: Endoscopy;  Laterality: N/A;  . CORONARY ANGIOPLASTY WITH STENT PLACEMENT    . CYSTOSCOPY    . ESOPHAGOGASTRODUODENOSCOPY N/A 09/06/2018   Procedure: ESOPHAGOGASTRODUODENOSCOPY (EGD);  Surgeon: TVirgel Manifold MD;  [facility];  Service: Endoscopy;  Laterality: N/A;  . OOPHORECTOMY    . PARTIAL MASTECTOMY WITH AXILLARY SENTINEL LYMPH NODE BIOPSY Right 08/09/2016   Procedure: PARTIAL MASTECTOMY WITH AXILLARY SENTINEL LYMPH NODE BIOPSY;  Surgeon: Robert Bellow, MD;  Location: [facility] ORS;  Service: General;  Laterality: Right;  . TONSILLECTOMY      FAMILY HISTORY: Family History  Problem Relation Age of Onset  . Hyperlipidemia Mother   . Allergies Mother   . Cerebral aneurysm  Mother        cause of death at age 73  . Heart disease Father        Fatal MI ag 68  . Cancer Brother        lung cancer  . Hyperlipidemia Brother   . Colonic polyp Brother   . Healthy Son   . Cancer - Lung Brother        colon  . Healthy Son   . Breast cancer Neg Hx     ADVANCED DIRECTIVES (Y/N):  N  HEALTH MAINTENANCE: Social History   Tobacco Use  . Smoking status: Former Smoker    Packs/day: 0.25    Years: 10.00    Pack years: 2.50    Types: Cigarettes    Quit date: 03/06/1998    Years since quitting: 21.2  . Smokeless tobacco: Never Used  Substance Use Topics  . Alcohol use: No  . Drug use: No     Colonoscopy:  PAP:  Bone density:  Lipid panel:  Allergies  Allergen Reactions  . Celebrex  [Celecoxib]     Dark stools.  . Penicillins Other (See Comments)    Childhood reaction Has patient had a PCN reaction causing immediate rash, facial/tongue/throat swelling, SOB or lightheadedness with hypotension: Unknown Has patient had a PCN reaction causing severe rash involving mucus membranes or skin necrosis: Unknown Has patient had a PCN reaction that required hospitalization: Unknown Has patient had a PCN reaction occurring within the last 10 years: Unknown If all of the above answers are "NO", then may proceed with Cephalosporin use.   Marland Kitchen Prevacid [Lansoprazole] Diarrhea    Current Outpatient Medications  Medication Sig Dispense Refill  . Calcium Carb-Cholecalciferol (CALCIUM 500+D PO) Take by mouth every other day.    . letrozole (FEMARA) 2.5 MG tablet Take 1 tablet (2.5 mg total) by mouth daily. 90 tablet 0  . lisinopril (ZESTRIL) 20 MG tablet Take 20 mg by mouth daily.    . meloxicam (MOBIC) 7.5 MG tablet Take 7.5 mg by mouth daily.     No current facility-administered medications for this visit.    OBJECTIVE: Vitals:   05/31/19 1118  BP: 127/74  Pulse: 77  Temp: (!) 96.9 F (36.1 C)     Body mass index is 21.15 kg/m.    ECOG FS:0 -  Asymptomatic  General: Well-developed, well-nourished, no acute distress. Eyes: Pink conjunctiva, anicteric sclera. HEENT: Normocephalic, moist mucous membranes. Breast: Exam deferred today. Lungs: No audible wheezing or coughing. Heart: Regular rate and rhythm. Abdomen: Soft, nontender, no obvious distention. Musculoskeletal: No edema, cyanosis, or clubbing. Neuro: Alert, answering all questions appropriately. Cranial nerves grossly intact. Skin: No rashes or petechiae noted. Psych: Normal affect.  LAB RESULTS:  Lab Results  Component Value Date   NA 145 (H) 12/26/2018   K 4.0 12/26/2018   CL 108 (H) 12/26/2018   CO2 25 12/26/2018   GLUCOSE 79 12/26/2018   BUN 22 12/26/2018   CREATININE 0.91 12/26/2018   CALCIUM 9.9 12/26/2018   PROT 6.4 12/26/2018   ALBUMIN 3.9 12/26/2018   AST 16 12/26/2018   ALT 6 12/26/2018   ALKPHOS 67 12/26/2018   BILITOT 0.6 12/26/2018   GFRNONAA 60 12/26/2018  GFRAA 69 12/26/2018    Lab Results  Component Value Date   WBC 7.4 12/26/2018   NEUTROABS 5.6 12/26/2018   HGB 11.8 12/26/2018   HCT 35.4 12/26/2018   MCV 84 12/26/2018   PLT 250 12/26/2018     STUDIES: MM DIAG BREAST TOMO BILATERAL  Result Date: 05/28/2019 CLINICAL DATA:  Right lumpectomy.  Annual mammography. EXAM: DIGITAL DIAGNOSTIC BILATERAL MAMMOGRAM WITH CAD AND TOMO COMPARISON:  Previous exam(s). ACR Breast Density Category b: There are scattered areas of fibroglandular density. FINDINGS: The right lumpectomy site is stable in appears as expected. No evidence of malignancy in either breast. Mammographic images were processed with CAD. IMPRESSION: No mammographic evidence of malignancy. RECOMMENDATION: Annual diagnostic mammography. I have discussed the findings and recommendations with the patient. If applicable, a reminder letter will be sent to the patient regarding the next appointment. BI-RADS CATEGORY  2: Benign. 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  PLAN:    1. 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 Given the size of the tumor, no Oncotype DX was ordered and adjuvant chemotherapy was not recommended. She completed adjuvant XRT in August 2018.  Continue letrozole for a total of 5 years completing treatment in August 2023.  Her most recent mammogram on May 28, 2019 was reported as BI-RADS 2.  Repeat in March 2022.  Return to clinic in 6 months for routine evaluation.    2.  Osteopenia: Patient's most recent bone mineral density on February 13, 2019 reported T score of -2.3 which is slightly worse than 2 years prior where the T score was reported at -1.8.  Continue calcium and vitamin D supplementation.  Repeat bone mineral density in December 2021.  If her T score continues to trend down will consider adding Fosamax. 3.  GI bleed: Secondary to diverticular bleed.  Colonoscopy and upper endoscopy on September 06, 2018 did not reveal active bleed or other significant pathology.   Patient expressed understanding and was in agreement with this plan. She also understands that She can call clinic at any time with any questions, concerns, or complaints.    Cancer Staging Malignant neoplasm of upper-inner quadrant of right breast in female, estrogen receptor positive ([place]) Staging form: Breast, AJCC 8th Edition - Pathologic stage from 08/30/2016: Stage IA (pT1b, pN0, cM0, G2, ER+, PR+, HER2-) - Signed by Lloyd Huger, MD on 08/30/2016   Lloyd Huger, MD   05/31/2019 3:13 PM

## 2019-05-28 ENCOUNTER — Ambulatory Visit
Admission: RE | Admit: 2019-05-28 | Discharge: 2019-05-28 | Disposition: A | Discharge status: Home / Self Care | Payer: PPO | Source: Ambulatory Visit | Attending: Oncology | Admitting: Oncology

## 2019-05-28 DIAGNOSIS — Z170 Estrogen receptor positive status [ER+]: Secondary | ICD-10-CM | POA: Diagnosis not present

## 2019-05-28 DIAGNOSIS — C50211 Malignant neoplasm of upper-inner quadrant of right female breast: Secondary | ICD-10-CM

## 2019-05-28 DIAGNOSIS — R928 Other abnormal and inconclusive findings on diagnostic imaging of breast: Secondary | ICD-10-CM | POA: Diagnosis not present

## 2019-05-31 ENCOUNTER — Inpatient Hospital Stay: Payer: PPO | Attending: Oncology | Admitting: Oncology

## 2019-05-31 ENCOUNTER — Encounter: Payer: Self-pay | Admitting: Oncology

## 2019-05-31 ENCOUNTER — Other Ambulatory Visit: Payer: Self-pay | Admitting: *Deleted

## 2019-05-31 VITALS — BP 127/74 | HR 77 | Temp 96.9°F | Wt 123.2 lb

## 2019-05-31 DIAGNOSIS — M8580 Other specified disorders of bone density and structure, unspecified site: Secondary | ICD-10-CM | POA: Diagnosis not present

## 2019-05-31 DIAGNOSIS — Z90710 Acquired absence of both cervix and uterus: Secondary | ICD-10-CM | POA: Diagnosis not present

## 2019-05-31 DIAGNOSIS — Z79811 Long term (current) use of aromatase inhibitors: Secondary | ICD-10-CM | POA: Insufficient documentation

## 2019-05-31 DIAGNOSIS — E785 Hyperlipidemia, unspecified: Secondary | ICD-10-CM | POA: Diagnosis not present

## 2019-05-31 DIAGNOSIS — Z87891 Personal history of nicotine dependence: Secondary | ICD-10-CM | POA: Insufficient documentation

## 2019-05-31 DIAGNOSIS — I10 Essential (primary) hypertension: Secondary | ICD-10-CM | POA: Diagnosis not present

## 2019-05-31 DIAGNOSIS — I252 Old myocardial infarction: Secondary | ICD-10-CM | POA: Diagnosis not present

## 2019-05-31 DIAGNOSIS — Z170 Estrogen receptor positive status [ER+]: Secondary | ICD-10-CM

## 2019-05-31 DIAGNOSIS — C50211 Malignant neoplasm of upper-inner quadrant of right female breast: Secondary | ICD-10-CM | POA: Insufficient documentation

## 2019-05-31 DIAGNOSIS — Z79899 Other long term (current) drug therapy: Secondary | ICD-10-CM | POA: Diagnosis not present

## 2019-05-31 DIAGNOSIS — Z8349 Family history of other endocrine, nutritional and metabolic diseases: Secondary | ICD-10-CM | POA: Insufficient documentation

## 2019-05-31 DIAGNOSIS — Z90721 Acquired absence of ovaries, unilateral: Secondary | ICD-10-CM | POA: Diagnosis not present

## 2019-05-31 DIAGNOSIS — Z923 Personal history of irradiation: Secondary | ICD-10-CM | POA: Insufficient documentation

## 2019-05-31 DIAGNOSIS — Z791 Long term (current) use of non-steroidal anti-inflammatories (NSAID): Secondary | ICD-10-CM | POA: Insufficient documentation

## 2019-05-31 MED ORDER — LETROZOLE 2.5 MG PO TABS: 2.5000 mg | ORAL_TABLET | Freq: Every day | ORAL | 0 refills | Status: DC

## 2019-06-06 DIAGNOSIS — H532 Diplopia: Secondary | ICD-10-CM | POA: Diagnosis not present

## 2019-06-11 ENCOUNTER — Other Ambulatory Visit: Payer: Self-pay

## 2019-06-11 ENCOUNTER — Inpatient Hospital Stay
Admission: EM | Admit: 2019-06-11 | Discharge: 2019-06-15 | DRG: 683 | Disposition: A | Discharge status: Home Health | Payer: PPO | Attending: Internal Medicine | Admitting: Internal Medicine

## 2019-06-11 ENCOUNTER — Emergency Department: Payer: PPO

## 2019-06-11 DIAGNOSIS — K219 Gastro-esophageal reflux disease without esophagitis: Secondary | ICD-10-CM | POA: Diagnosis present

## 2019-06-11 DIAGNOSIS — N1330 Unspecified hydronephrosis: Secondary | ICD-10-CM | POA: Diagnosis present

## 2019-06-11 DIAGNOSIS — Z880 Allergy status to penicillin: Secondary | ICD-10-CM

## 2019-06-11 DIAGNOSIS — N179 Acute kidney failure, unspecified: Secondary | ICD-10-CM | POA: Diagnosis not present

## 2019-06-11 DIAGNOSIS — N3592 Unspecified urethral stricture, female: Secondary | ICD-10-CM | POA: Diagnosis present

## 2019-06-11 DIAGNOSIS — E785 Hyperlipidemia, unspecified: Secondary | ICD-10-CM | POA: Diagnosis present

## 2019-06-11 DIAGNOSIS — E872 Acidosis: Secondary | ICD-10-CM | POA: Diagnosis present

## 2019-06-11 DIAGNOSIS — K2980 Duodenitis without bleeding: Secondary | ICD-10-CM | POA: Diagnosis present

## 2019-06-11 DIAGNOSIS — Z955 Presence of coronary angioplasty implant and graft: Secondary | ICD-10-CM | POA: Diagnosis not present

## 2019-06-11 DIAGNOSIS — D638 Anemia in other chronic diseases classified elsewhere: Secondary | ICD-10-CM | POA: Diagnosis present

## 2019-06-11 DIAGNOSIS — R338 Other retention of urine: Secondary | ICD-10-CM | POA: Diagnosis present

## 2019-06-11 DIAGNOSIS — T465X6A Underdosing of other antihypertensive drugs, initial encounter: Secondary | ICD-10-CM | POA: Diagnosis present

## 2019-06-11 DIAGNOSIS — N320 Bladder-neck obstruction: Secondary | ICD-10-CM | POA: Diagnosis present

## 2019-06-11 DIAGNOSIS — Z90710 Acquired absence of both cervix and uterus: Secondary | ICD-10-CM | POA: Diagnosis not present

## 2019-06-11 DIAGNOSIS — D631 Anemia in chronic kidney disease: Secondary | ICD-10-CM | POA: Diagnosis not present

## 2019-06-11 DIAGNOSIS — Z20822 Contact with and (suspected) exposure to covid-19: Secondary | ICD-10-CM | POA: Diagnosis present

## 2019-06-11 DIAGNOSIS — Z66 Do not resuscitate: Secondary | ICD-10-CM | POA: Diagnosis present

## 2019-06-11 DIAGNOSIS — Z923 Personal history of irradiation: Secondary | ICD-10-CM

## 2019-06-11 DIAGNOSIS — I160 Hypertensive urgency: Secondary | ICD-10-CM | POA: Diagnosis not present

## 2019-06-11 DIAGNOSIS — N323 Diverticulum of bladder: Secondary | ICD-10-CM | POA: Diagnosis present

## 2019-06-11 DIAGNOSIS — Z79811 Long term (current) use of aromatase inhibitors: Secondary | ICD-10-CM

## 2019-06-11 DIAGNOSIS — N3289 Other specified disorders of bladder: Secondary | ICD-10-CM | POA: Diagnosis present

## 2019-06-11 DIAGNOSIS — N139 Obstructive and reflux uropathy, unspecified: Secondary | ICD-10-CM | POA: Diagnosis not present

## 2019-06-11 DIAGNOSIS — Z87891 Personal history of nicotine dependence: Secondary | ICD-10-CM

## 2019-06-11 DIAGNOSIS — R531 Weakness: Secondary | ICD-10-CM | POA: Diagnosis not present

## 2019-06-11 DIAGNOSIS — I2510 Atherosclerotic heart disease of native coronary artery without angina pectoris: Secondary | ICD-10-CM | POA: Diagnosis present

## 2019-06-11 DIAGNOSIS — Z791 Long term (current) use of non-steroidal anti-inflammatories (NSAID): Secondary | ICD-10-CM

## 2019-06-11 DIAGNOSIS — E876 Hypokalemia: Secondary | ICD-10-CM | POA: Diagnosis not present

## 2019-06-11 DIAGNOSIS — Z83438 Family history of other disorder of lipoprotein metabolism and other lipidemia: Secondary | ICD-10-CM

## 2019-06-11 DIAGNOSIS — Z79899 Other long term (current) drug therapy: Secondary | ICD-10-CM

## 2019-06-11 DIAGNOSIS — I252 Old myocardial infarction: Secondary | ICD-10-CM | POA: Diagnosis not present

## 2019-06-11 DIAGNOSIS — Z888 Allergy status to other drugs, medicaments and biological substances status: Secondary | ICD-10-CM

## 2019-06-11 DIAGNOSIS — I10 Essential (primary) hypertension: Secondary | ICD-10-CM

## 2019-06-11 DIAGNOSIS — E1151 Type 2 diabetes mellitus with diabetic peripheral angiopathy without gangrene: Secondary | ICD-10-CM | POA: Diagnosis not present

## 2019-06-11 DIAGNOSIS — E875 Hyperkalemia: Secondary | ICD-10-CM

## 2019-06-11 DIAGNOSIS — R339 Retention of urine, unspecified: Secondary | ICD-10-CM | POA: Diagnosis not present

## 2019-06-11 DIAGNOSIS — Z8249 Family history of ischemic heart disease and other diseases of the circulatory system: Secondary | ICD-10-CM

## 2019-06-11 DIAGNOSIS — Z853 Personal history of malignant neoplasm of breast: Secondary | ICD-10-CM

## 2019-06-11 DIAGNOSIS — K5730 Diverticulosis of large intestine without perforation or abscess without bleeding: Secondary | ICD-10-CM | POA: Diagnosis not present

## 2019-06-11 DIAGNOSIS — Z886 Allergy status to analgesic agent status: Secondary | ICD-10-CM

## 2019-06-11 LAB — BASIC METABOLIC PANEL
Anion gap: 19 — ABNORMAL HIGH (ref 5–15)
BUN: 98 mg/dL — ABNORMAL HIGH (ref 8–23)
CO2: 16 mmol/L — ABNORMAL LOW (ref 22–32)
Calcium: 8.8 mg/dL — ABNORMAL LOW (ref 8.9–10.3)
Chloride: 104 mmol/L (ref 98–111)
Creatinine, Ser: 13.01 mg/dL — ABNORMAL HIGH (ref 0.44–1.00)
GFR calc Af Amer: 3 mL/min — ABNORMAL LOW (ref >60)
GFR calc non Af Amer: 2 mL/min — ABNORMAL LOW (ref >60)
Glucose, Bld: 89 mg/dL (ref 70–99)
Potassium: 7.2 mmol/L (ref 3.5–5.1)
Sodium: 139 mmol/L (ref 135–145)

## 2019-06-11 LAB — CBC
HCT: 32.5 % — ABNORMAL LOW (ref 36.0–46.0)
Hemoglobin: 10 g/dL — ABNORMAL LOW (ref 12.0–15.0)
MCH: 26.5 pg (ref 26.0–34.0)
MCHC: 30.8 g/dL (ref 30.0–36.0)
MCV: 86 fL (ref 80.0–100.0)
Platelets: 265 10*3/uL (ref 150–400)
RBC: 3.78 MIL/uL — ABNORMAL LOW (ref 3.87–5.11)
RDW: 20.8 % — ABNORMAL HIGH (ref 11.5–15.5)
WBC: 8.9 10*3/uL (ref 4.0–10.5)
nRBC: 0 % (ref 0.0–0.2)

## 2019-06-11 MED ORDER — SODIUM CHLORIDE 0.9 % IV SOLN: 1.0000 g | Freq: Once | INTRAVENOUS | Status: DC

## 2019-06-11 MED ORDER — SODIUM CHLORIDE 0.9 % IV BOLUS
1000.0000 mL | Freq: Once | INTRAVENOUS | Status: AC
  Administered 2019-06-11: 1000 mL via INTRAVENOUS

## 2019-06-11 MED ORDER — CALCIUM GLUCONATE-NACL 1-0.675 GM/50ML-% IV SOLN
1.0000 g | Freq: Once | INTRAVENOUS | Status: AC
  Administered 2019-06-12: 1000 mg via INTRAVENOUS
  Filled 2019-06-11: qty 50

## 2019-06-11 NOTE — ED Provider Notes (Signed)
[facility] Emergency Department Provider Note   ____________________________________________   First MD Initiated Contact with Patient 06/11/19 2330     (approximate)  I have reviewed the triage vital signs and the nursing notes.   HISTORY  Chief Complaint Weakness    HPI [name] [name] is a 79 y.o. female who presents to the ED from home with a chief complaint of generalized weakness.  Patient reports feeling weak upon awakening in the morning.  Also states she has had difficulty ambulating over the past few days secondary to weakness.  Has not taking her blood pressure medications today.  Endorses polyuria.  Denies fever, cough, chest pain, shortness of breath, abdominal pain, nausea, vomiting or diarrhea.  Denies recent travel, trauma or COVID-19 exposure.       Past Medical History:  Diagnosis Date  . Breast cancer ([place]) 2018   Right breast  . 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 Mucin noted on biopsy, not on wide excision.  . Coronary artery disease   . GERD (gastroesophageal reflux disease)   . Hyperlipidemia   . Hypertension   . 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 Dr. Kellie Moor DX by punch bioppsy  . Lichen planus    right breast   . Lichen planus   . MI (myocardial infarction) ([place])    2015  . Personal history of radiation therapy 09/2016   RIGHT lumpectomy w/ radiation    Patient Active Problem List   Diagnosis Date Noted  . History of breast cancer 06/12/2019  . AKI (acute kidney injury) ([place]) 06/12/2019  . Generalized weakness 06/12/2019  . Hyperkalemia 06/12/2019  . Hypertensive urgency 06/12/2019  . Disorder of bursae of shoulder region 10/20/2018  . Lower GI bleeding 09/10/2018  . Hiatal hernia   . Melena   . Polyp of ascending colon   . Diverticulosis of large intestine without diverticulitis   . GIB (gastrointestinal  bleeding) 09/05/2018  . LVH (left ventricular hypertrophy) due to hypertensive disease, without heart failure 04/10/2018  . Chest pain 03/31/2018  . Vasovagal syncope 03/20/2018  . Lichen planus [id number]  . Malignant neoplasm of upper-inner quadrant of right breast in female, estrogen receptor positive ([place]) 07/28/2016  . Gastric ulcer requiring drug therapy, chronic 01/23/2016  . MI (mitral incompetence) 07/22/2015  . Combined fat and carbohydrate induced hyperlipemia 12/26/2014  . UTI (lower urinary tract infection) 08/18/2014  . Blurry vision 08/18/2014  . Allergic rhinitis 07/12/2014  . Absolute anemia 07/12/2014  . Baker's cyst of knee 07/12/2014  . Atherosclerosis of coronary artery 07/12/2014  . CAFL (chronic airflow limitation) ([place]) 07/12/2014  . Dizziness 07/12/2014  . Acid reflux 07/12/2014  . 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 History of colon polyps 07/12/2014  . Hypercholesteremia 07/12/2014  . Malaise and fatigue 07/12/2014  . Heart attack ([place]) 07/12/2014  . Muscle ache 07/12/2014  . Arthritis, degenerative 07/12/2014  . Bradycardia 08/23/2013  . CAD (coronary artery disease) 08/17/2013  . Diabetes ([place]) 08/17/2013  . Diabetes mellitus ([place]) 08/17/2013  . Peripheral vascular disease ([place]) 08/17/2013    Past Surgical History:  Procedure Laterality Date  . ABDOMINAL HYSTERECTOMY    . APPENDECTOMY    . BREAST BIOPSY Right 07/23/2016   INVASIVE MAMMARY CARCINOMA WITH AREAS OF EXTRACELLULAR MUCIN  . BREAST EXCISIONAL BIOPSY     INVASIVE MUCINOUS MAMMARY CARCINOMA.   Marland Kitchen BREAST LUMPECTOMY Right 08/20/2016   INVASIVE  MUCINOUS MAMMARY CARCINOMA. /Grade 2   . COLONOSCOPY  2016  . COLONOSCOPY N/A 09/06/2018   Procedure: COLONOSCOPY;  Surgeon: Virgel Manifold, MD;  Location: [facility] ENDOSCOPY;  Service: Endoscopy;  Laterality: N/A;  . CORONARY ANGIOPLASTY WITH STENT PLACEMENT    . CYSTOSCOPY    . ESOPHAGOGASTRODUODENOSCOPY N/A 09/06/2018   Procedure:  ESOPHAGOGASTRODUODENOSCOPY (EGD);  Surgeon: Virgel Manifold, MD;  Location: [facility] ENDOSCOPY;  Service: Endoscopy;  Laterality: N/A;  . OOPHORECTOMY    . PARTIAL MASTECTOMY WITH AXILLARY SENTINEL LYMPH NODE BIOPSY Right 08/09/2016   Procedure: PARTIAL MASTECTOMY WITH AXILLARY SENTINEL LYMPH NODE BIOPSY;  Surgeon: Robert Bellow, MD;  Location: [facility] ORS;  Service: General;  Laterality: Right;  . TONSILLECTOMY      Prior to Admission medications   Medication Sig Start Date End Date Taking? Authorizing Provider  Calcium Carb-Cholecalciferol (CALCIUM 500+D PO) Take by mouth every other day.   Yes [provider]  lisinopril (ZESTRIL) 20 MG tablet Take 20 mg by mouth daily. 05/29/19  Yes [provider]  meloxicam (MOBIC) 7.5 MG tablet Take 7.5 mg by mouth daily. 12/21/18  Yes [provider]  omeprazole (PRILOSEC) 20 MG capsule Take 20 mg by mouth daily as needed. 06/06/19  Yes [provider]    Allergies Celebrex  [celecoxib], Penicillins, and Prevacid [lansoprazole]  Family History  Problem Relation Age of Onset  . Hyperlipidemia Mother   . Allergies Mother   . Cerebral aneurysm Mother        cause of death at age 17  . Heart disease Father        Fatal MI ag 40  . Cancer Brother        lung cancer  . Hyperlipidemia Brother   . Colonic polyp Brother   . Healthy Son   . Cancer - Lung Brother        colon  . Healthy Son   . Breast cancer Neg Hx     Social History Social History   Tobacco Use  . Smoking status: Former Smoker    Packs/day: 0.25    Years: 10.00    Pack years: 2.50    Types: Cigarettes    Quit date: 03/06/1998    Years since quitting: 21.2  . Smokeless tobacco: Never Used  Substance Use Topics  . Alcohol use: No  . Drug use: No    Review of Systems  Constitutional: Positive for generalized weakness.  No fever/chills Eyes: No visual changes. ENT: No sore throat. Cardiovascular: Denies chest pain. Respiratory:  Denies shortness of breath. Gastrointestinal: No abdominal pain.  No nausea, no vomiting.  No diarrhea.  No constipation. Genitourinary: Positive for urinary frequency.  Negative for dysuria. Musculoskeletal: Negative for back pain. Skin: Negative for rash. Neurological: Negative for headaches, focal weakness or numbness.   ____________________________________________   PHYSICAL EXAM:  VITAL SIGNS: ED Triage Vitals  Enc Vitals Group     BP 06/11/19 1849 (!) 212/101     Pulse Rate 06/11/19 1849 95     Resp 06/11/19 1849 16     Temp 06/11/19 1849 98.3 F (36.8 C)     Temp Source 06/11/19 1849 Oral     SpO2 06/11/19 1849 100 %     Weight 06/11/19 1850 123 lb (55.8 kg)     Height 06/11/19 1850 5\' 5"  (1.651 m)     Head Circumference --      Peak Flow --      Pain Score 06/11/19 1902  0     Pain Loc --      Pain Edu? --      Excl. in [place]? --     Constitutional: Alert and oriented.  Elderly appearing and in no acute distress. Eyes: Conjunctivae are normal. PERRL. EOMI. Head: Atraumatic. Nose: No congestion/rhinnorhea. Mouth/Throat: Mucous membranes are moist.  Oropharynx non-erythematous. Neck: No stridor.   Cardiovascular: Normal rate, regular rhythm. Grossly normal heart sounds.  Good peripheral circulation. Respiratory: Normal respiratory effort.  No retractions. Lungs CTAB. Gastrointestinal: Soft and nontender to D palpation. No distention. No abdominal bruits. No CVA tenderness. Musculoskeletal: No lower extremity tenderness nor edema.  No joint effusions. Neurologic:  Normal speech and language. No gross focal neurologic deficits are appreciated. Skin:  Skin is warm, dry and intact. No rash noted. Psychiatric: Mood and affect are normal. Speech and behavior are normal.  ____________________________________________   LABS (all labs ordered are listed, but only abnormal results are displayed)  Labs Reviewed  BASIC METABOLIC PANEL - Abnormal; Notable for the following  components:      Result Value   Potassium 7.2 (*)    CO2 16 (*)    BUN 98 (*)    Creatinine, Ser 13.01 (*)    Calcium 8.8 (*)    GFR calc non Af Amer 2 (*)    GFR calc Af Amer 3 (*)    Anion gap 19 (*)    All other components within normal limits  CBC - Abnormal; Notable for the following components:   RBC 3.78 (*)    Hemoglobin 10.0 (*)    HCT 32.5 (*)    RDW 20.8 (*)    All other components within normal limits  COMPREHENSIVE METABOLIC PANEL - Abnormal; Notable for the following components:   Potassium >7.5 (*)    CO2 17 (*)    BUN 97 (*)    Creatinine, Ser 12.98 (*)    GFR calc non Af Amer 2 (*)    GFR calc Af Amer 3 (*)    Anion gap 19 (*)    All other components within normal limits  RESPIRATORY PANEL BY RT PCR (FLU A&B, COVID)  URINALYSIS, COMPLETE (UACMP) WITH MICROSCOPIC  CBC  CBC  BASIC METABOLIC PANEL  CBG MONITORING, ED  TROPONIN I (HIGH SENSITIVITY)  TROPONIN I (HIGH SENSITIVITY)   ____________________________________________  EKG  ED ECG REPORT I, Rayshawn Visconti J, the attending physician, personally viewed and interpreted this ECG.   Date: 06/11/2019  EKG Time: 1852  Rate: 99  Rhythm: normal EKG, normal sinus rhythm  Axis: Normal  Intervals:none  ST&T Change: Nonspecific, peaked T waves   ED ECG REPORT I, Donavan Kerlin J, the attending physician, personally viewed and interpreted this ECG.   Date: 06/12/2019  EKG Time: 0256  Rate: 114  Rhythm: sinus tachycardia  Axis: Normal  Intervals:none  ST&T Change: Peaked T waves improved   ____________________________________________  RADIOLOGY  ED MD interpretation: No acute cardiopulmonary process, large hiatal hernia; CT demonstrates duodenitis, moderate to severe bilateral hydronephrosis, stable hepatic cysts, multiple large urinary bladder diverticula  Official radiology report(s): DG Chest Port 1 View  Result Date: 06/11/2019 CLINICAL DATA:  Weakness. EXAM: PORTABLE CHEST 1 VIEW COMPARISON:   March 31, 2018 FINDINGS: The heart size is stable. Aortic calcifications are noted. There is a large hiatal hernia. There is no pneumothorax. No significant pleural effusion. There is some atelectasis at the lung bases. There is no acute osseous abnormality. IMPRESSION: No acute cardiopulmonary disease. Large hiatal hernia.  Electronically Signed   By: Constance Holster M.D.   On: 06/11/2019 23:52   CT Renal Stone Study  Result Date: 06/12/2019 CLINICAL DATA:  Weakness. EXAM: CT ABDOMEN AND PELVIS WITHOUT CONTRAST TECHNIQUE: Multidetector CT imaging of the abdomen and pelvis was performed following the standard protocol without IV contrast. COMPARISON:  January 21, 2015 FINDINGS: Lower chest: Mild atelectasis is seen within the right lung base. Hepatobiliary: A diminutive left lobe of the liver is noted. Stable 3.0 cm x 2.5 cm, 6.1 cm x 4.5 cm and 3.7 cm x 2.6 cm cysts are seen within the anterior aspect of the right lobe of the liver. An additional 3.9 cm x 2.9 cm area of mildly increased attenuation is noted within the anteromedial aspect of the right lobe. This is seen on the prior study and is decreased in size on the current exam. There is a mild amount of perihepatic fluid. The gallbladder is poorly visualized and may be contracted. Pancreas: Pancreas is normal in appearance without evidence of pancreatic ductal dilatation. Spleen: A mild amount of perisplenic fluid is seen along the anterior and lateral aspects of an otherwise normal-appearing spleen. Adrenals/Urinary Tract: Adrenal glands are unremarkable. The kidneys are normal in size, without renal calculi or focal lesions. Moderate to marked severity bilateral hydronephrosis and hydroureter are seen. The urinary bladder is markedly distended. Multiple large urinary bladder diverticula are seen. These are increased in size when compared to the prior study. The largest measures approximately 10.0 cm x 9.2 cm and is located along the anterolateral  aspect of the urinary bladder on the right. Stomach/Bowel: A very large gastric hernia is seen. This is present on the prior study. The appendix is not clearly identified. No evidence of bowel dilatation. Numerous noninflamed diverticula are seen throughout the large bowel. A marked amount of inflammatory fat stranding is seen within the abdomen on the right. This surrounds the proximal portion of the duodenum and extends inferiorly within the right abdomen along the anterior and posterior pararenal spaces. Vascular/Lymphatic: There is marked severity aortic calcification. No enlarged abdominal or pelvic lymph nodes. Reproductive: Status post hysterectomy. No adnexal masses. Other: A mild-to-moderate amount of fluid is seen within the lateral aspect of the abdomen on the right. This extends inferiorly into the right hemipelvis. A small amount of posterior pelvic free fluid is noted. Musculoskeletal: Multilevel degenerative changes seen throughout the lumbar spine. IMPRESSION: 1. Marked severity inflammatory process within the right abdomen within extensive amount of associated inflammatory fat stranding, as described above. Given the pattern of inflammatory stranding, this is likely consistent with marked severity duodenitis, however, correlation with a contrast-enhanced CT evaluation of the abdomen and pelvis is recommended. 2. Colonic diverticulosis. 3. Moderate to marked severity bilateral hydronephrosis and hydroureter 4. Mild-to-moderate amount of fluid is seen within the lateral aspect of the abdomen on the right. This extends inferiorly into the right hemipelvis. 5. Large gastric hernia. 6. Stable hepatic cysts. 7. Stable 3.9 cm x 2.9 cm area of mildly increased attenuation within the anteromedial aspect of the right lobe of the liver which may represent a previously collapsed hepatic cyst. 8. Multiple large urinary bladder diverticula, increased in size when compared to the prior study. Aortic  Atherosclerosis (ICD10-I70.0). Electronically Signed   By: Virgina Norfolk M.D.   On: 06/12/2019 03:02    ____________________________________________   PROCEDURES  Procedure(s) performed (including Critical Care):  .1-3 Lead EKG Interpretation Performed by: Paulette Blanch, MD Authorized by: Paulette Blanch, MD  Interpretation: normal     ECG rate:  95   ECG rate assessment: normal     Rhythm: sinus rhythm     Ectopy: none     Conduction: normal     CRITICAL CARE Performed by: Paulette Blanch   Total critical care time: 45 minutes  Critical care time was exclusive of separately billable procedures and treating other patients.  Critical care was necessary to treat or prevent imminent or life-threatening deterioration.  Critical care was time spent personally by me on the following activities: development of treatment plan with patient and/or surrogate as well as nursing, discussions with consultants, evaluation of patient's response to treatment, examination of patient, obtaining history from patient or surrogate, ordering and performing treatments and interventions, ordering and review of laboratory studies, ordering and review of radiographic studies, pulse oximetry and re-evaluation of patient's condition.   ____________________________________________   INITIAL IMPRESSION / ASSESSMENT AND PLAN / ED COURSE  As part of my medical decision making, I reviewed the following data within the [place] History obtained from family, Nursing notes reviewed and incorporated, Labs reviewed, EKG interpreted, Old chart reviewed, Radiograph reviewed and Notes from prior ED visits     Shayonna B Trethewey was evaluated in Emergency Department on 06/12/2019 for the symptoms described in the history of present illness. She was evaluated in the context of the global COVID-19 pandemic, which necessitated consideration that the patient might be at risk for infection with the  SARS-CoV-2 virus that causes COVID-19. Institutional protocols and algorithms that pertain to the evaluation of patients at risk for COVID-19 are in a state of rapid change based on information released by regulatory bodies including the CDC and federal and state organizations. These policies and algorithms were followed during the patient's care in the ED.    79 year old female presenting with generalized weakness.  Differential diagnosis includes but is not limited to infectious, metabolic, ACS, etc.  Initial laboratory results reveal acute renal failure with hyperkalemia and elevated anion gap.  This is markedly changed from 5 months ago.  Will repeat electrolytes, initiate IV fluid resuscitation, cover with calcium gluconate pending repeat metabolic panel.    Clinical Course as of Jun 11 508  Tue Jun 12, 2019  0130 Redraw confirms acute renal failure and hyperkalemia.  Rest of hyperkalemia cocktail will be administered.  Will discuss with hospitalist services for admission.   [JS]  0142 Discussed with Dr. Damita Dunnings who recommends CT to rule out obstructive uropathy.   [JS]  0320 Discussed with Dr. Damita Dunnings patient's CT imaging results.  Hold Protonix given patient's renal function.  CT demonstrates moderate to severe bilateral hydronephrosis without stones.  Will need inpatient urology consultation in addition to nephrology.   [JS]  [phone] Spoke with Dr. Juleen China from nephrology who does recommend administering Veltassa.   [JS]    Clinical Course User Index [JS] Paulette Blanch, MD     ____________________________________________   FINAL CLINICAL IMPRESSION(S) / ED DIAGNOSES  Final diagnoses:  Generalized weakness  Acute renal failure, unspecified acute renal failure type [facility])  Hyperkalemia     ED Discharge Orders    None       Note:  This document was prepared using Dragon voice recognition software and may include unintentional dictation errors.   Paulette Blanch, MD 06/12/19  [phone]

## 2019-06-11 NOTE — ED Triage Notes (Signed)
Pt arrives c/o weakness new this morning. Pt denies any pain or numbness. Pt states she has had increased trouble walking over past few days.

## 2019-06-12 ENCOUNTER — Inpatient Hospital Stay: Payer: PPO

## 2019-06-12 ENCOUNTER — Other Ambulatory Visit: Payer: Self-pay | Admitting: Family Medicine

## 2019-06-12 ENCOUNTER — Inpatient Hospital Stay: Payer: PPO | Admitting: Anesthesiology

## 2019-06-12 ENCOUNTER — Encounter: Payer: Self-pay | Admitting: Internal Medicine

## 2019-06-12 ENCOUNTER — Encounter
Admission: EM | Disposition: A | Discharge status: Home Health | Payer: Self-pay | Source: Home / Self Care | Attending: Internal Medicine

## 2019-06-12 DIAGNOSIS — E785 Hyperlipidemia, unspecified: Secondary | ICD-10-CM | POA: Diagnosis present

## 2019-06-12 DIAGNOSIS — E872 Acidosis: Secondary | ICD-10-CM | POA: Diagnosis present

## 2019-06-12 DIAGNOSIS — R338 Other retention of urine: Secondary | ICD-10-CM | POA: Diagnosis present

## 2019-06-12 DIAGNOSIS — K2980 Duodenitis without bleeding: Secondary | ICD-10-CM | POA: Diagnosis present

## 2019-06-12 DIAGNOSIS — Z90710 Acquired absence of both cervix and uterus: Secondary | ICD-10-CM | POA: Diagnosis not present

## 2019-06-12 DIAGNOSIS — E875 Hyperkalemia: Secondary | ICD-10-CM

## 2019-06-12 DIAGNOSIS — Z955 Presence of coronary angioplasty implant and graft: Secondary | ICD-10-CM | POA: Diagnosis not present

## 2019-06-12 DIAGNOSIS — N179 Acute kidney failure, unspecified: Secondary | ICD-10-CM | POA: Diagnosis not present

## 2019-06-12 DIAGNOSIS — I2510 Atherosclerotic heart disease of native coronary artery without angina pectoris: Secondary | ICD-10-CM | POA: Diagnosis present

## 2019-06-12 DIAGNOSIS — I10 Essential (primary) hypertension: Secondary | ICD-10-CM | POA: Diagnosis present

## 2019-06-12 DIAGNOSIS — T465X6A Underdosing of other antihypertensive drugs, initial encounter: Secondary | ICD-10-CM | POA: Diagnosis present

## 2019-06-12 DIAGNOSIS — Z20822 Contact with and (suspected) exposure to covid-19: Secondary | ICD-10-CM | POA: Diagnosis present

## 2019-06-12 DIAGNOSIS — R339 Retention of urine, unspecified: Secondary | ICD-10-CM | POA: Diagnosis not present

## 2019-06-12 DIAGNOSIS — Z66 Do not resuscitate: Secondary | ICD-10-CM | POA: Diagnosis present

## 2019-06-12 DIAGNOSIS — K219 Gastro-esophageal reflux disease without esophagitis: Secondary | ICD-10-CM | POA: Diagnosis present

## 2019-06-12 DIAGNOSIS — I160 Hypertensive urgency: Secondary | ICD-10-CM

## 2019-06-12 DIAGNOSIS — N323 Diverticulum of bladder: Secondary | ICD-10-CM | POA: Diagnosis present

## 2019-06-12 DIAGNOSIS — Z853 Personal history of malignant neoplasm of breast: Secondary | ICD-10-CM

## 2019-06-12 DIAGNOSIS — R531 Weakness: Secondary | ICD-10-CM

## 2019-06-12 DIAGNOSIS — N320 Bladder-neck obstruction: Secondary | ICD-10-CM | POA: Diagnosis present

## 2019-06-12 DIAGNOSIS — N1330 Unspecified hydronephrosis: Secondary | ICD-10-CM | POA: Diagnosis present

## 2019-06-12 DIAGNOSIS — I252 Old myocardial infarction: Secondary | ICD-10-CM | POA: Diagnosis not present

## 2019-06-12 DIAGNOSIS — D638 Anemia in other chronic diseases classified elsewhere: Secondary | ICD-10-CM | POA: Diagnosis present

## 2019-06-12 DIAGNOSIS — E876 Hypokalemia: Secondary | ICD-10-CM | POA: Diagnosis not present

## 2019-06-12 DIAGNOSIS — N3289 Other specified disorders of bladder: Secondary | ICD-10-CM | POA: Diagnosis present

## 2019-06-12 DIAGNOSIS — N3592 Unspecified urethral stricture, female: Secondary | ICD-10-CM | POA: Diagnosis present

## 2019-06-12 HISTORY — PX: CYSTOSCOPY WITH URETHRAL DILATATION: SHX5125

## 2019-06-12 LAB — BASIC METABOLIC PANEL
Anion gap: 14 (ref 5–15)
BUN: 82 mg/dL — ABNORMAL HIGH (ref 8–23)
CO2: 17 mmol/L — ABNORMAL LOW (ref 22–32)
Calcium: 7.7 mg/dL — ABNORMAL LOW (ref 8.9–10.3)
Chloride: 114 mmol/L — ABNORMAL HIGH (ref 98–111)
Creatinine, Ser: 10.35 mg/dL — ABNORMAL HIGH (ref 0.44–1.00)
GFR calc Af Amer: 4 mL/min — ABNORMAL LOW (ref >60)
GFR calc non Af Amer: 3 mL/min — ABNORMAL LOW (ref >60)
Glucose, Bld: 35 mg/dL — CL (ref 70–99)
Potassium: 4.5 mmol/L (ref 3.5–5.1)
Sodium: 145 mmol/L (ref 135–145)

## 2019-06-12 LAB — COMPREHENSIVE METABOLIC PANEL
ALT: 12 U/L (ref 0–44)
AST: 15 U/L (ref 15–41)
Albumin: 3.9 g/dL (ref 3.5–5.0)
Alkaline Phosphatase: 50 U/L (ref 38–126)
Anion gap: 19 — ABNORMAL HIGH (ref 5–15)
BUN: 97 mg/dL — ABNORMAL HIGH (ref 8–23)
CO2: 17 mmol/L — ABNORMAL LOW (ref 22–32)
Calcium: 8.9 mg/dL (ref 8.9–10.3)
Chloride: 104 mmol/L (ref 98–111)
Creatinine, Ser: 12.98 mg/dL — ABNORMAL HIGH (ref 0.44–1.00)
GFR calc Af Amer: 3 mL/min — ABNORMAL LOW (ref >60)
GFR calc non Af Amer: 2 mL/min — ABNORMAL LOW (ref >60)
Glucose, Bld: 87 mg/dL (ref 70–99)
Potassium: >7.5 mmol/L (ref 3.5–5.1)
Sodium: 140 mmol/L (ref 135–145)
Total Bilirubin: 1 mg/dL (ref 0.3–1.2)
Total Protein: 7.5 g/dL (ref 6.5–8.1)

## 2019-06-12 LAB — GLUCOSE, CAPILLARY
Glucose-Capillary: 40 mg/dL — CL (ref 70–99)
Glucose-Capillary: 69 mg/dL — ABNORMAL LOW (ref 70–99)
Glucose-Capillary: 120 mg/dL — ABNORMAL HIGH (ref 70–99)
Glucose-Capillary: 113 mg/dL — ABNORMAL HIGH (ref 70–99)
Glucose-Capillary: 100 mg/dL — ABNORMAL HIGH (ref 70–99)
Glucose-Capillary: 105 mg/dL — ABNORMAL HIGH (ref 70–99)
Glucose-Capillary: 93 mg/dL (ref 70–99)
Glucose-Capillary: 162 mg/dL — ABNORMAL HIGH (ref 70–99)

## 2019-06-12 LAB — CBC
HCT: 29 % — ABNORMAL LOW (ref 36.0–46.0)
Hemoglobin: 8.9 g/dL — ABNORMAL LOW (ref 12.0–15.0)
MCH: 26.3 pg (ref 26.0–34.0)
MCHC: 30.7 g/dL (ref 30.0–36.0)
MCV: 85.5 fL (ref 80.0–100.0)
Platelets: 255 10*3/uL (ref 150–400)
RBC: 3.39 MIL/uL — ABNORMAL LOW (ref 3.87–5.11)
RDW: 21 % — ABNORMAL HIGH (ref 11.5–15.5)
WBC: 9.4 10*3/uL (ref 4.0–10.5)
nRBC: 0 % (ref 0.0–0.2)

## 2019-06-12 LAB — RESPIRATORY PANEL BY RT PCR (FLU A&B, COVID)
Influenza A by PCR: NEGATIVE
Influenza B by PCR: NEGATIVE
SARS Coronavirus 2 by RT PCR: NEGATIVE

## 2019-06-12 LAB — MRSA PCR SCREENING: MRSA by PCR: NEGATIVE

## 2019-06-12 LAB — TROPONIN I (HIGH SENSITIVITY)
Troponin I (High Sensitivity): 15 ng/L (ref <18)
Troponin I (High Sensitivity): 51 ng/L — ABNORMAL HIGH (ref <18)

## 2019-06-12 SURGERY — CYSTOSCOPY, WITH URETHRAL DILATION
Anesthesia: General

## 2019-06-12 MED ORDER — SODIUM POLYSTYRENE SULFONATE 15 GM/60ML PO SUSP
45.0000 g | Freq: Once | ORAL | Status: AC
  Administered 2019-06-12: 02:00:00 45 g via ORAL
  Filled 2019-06-12: qty 180

## 2019-06-12 MED ORDER — LACTATED RINGERS IV SOLN: INTRAVENOUS | Status: DC | PRN

## 2019-06-12 MED ORDER — FENTANYL CITRATE (PF) 100 MCG/2ML IJ SOLN
INTRAMUSCULAR | Status: AC
  Filled 2019-06-12: qty 2

## 2019-06-12 MED ORDER — SODIUM BICARBONATE-DEXTROSE 150-5 MEQ/L-% IV SOLN
150.0000 meq | INTRAVENOUS | Status: DC
  Administered 2019-06-12 – 2019-06-13 (×3): 150 meq via INTRAVENOUS
  Filled 2019-06-12 (×5): qty 1000

## 2019-06-12 MED ORDER — INSULIN ASPART 100 UNIT/ML ~~LOC~~ SOLN
8.0000 [IU] | Freq: Once | SUBCUTANEOUS | Status: AC
  Administered 2019-06-12: 02:00:00 8 [IU] via SUBCUTANEOUS
  Filled 2019-06-12: qty 1

## 2019-06-12 MED ORDER — LIDOCAINE HCL (CARDIAC) PF 100 MG/5ML IV SOSY
PREFILLED_SYRINGE | INTRAVENOUS | Status: DC | PRN
  Administered 2019-06-12: 60 mg via INTRAVENOUS

## 2019-06-12 MED ORDER — DEXAMETHASONE SODIUM PHOSPHATE 10 MG/ML IJ SOLN
INTRAMUSCULAR | Status: AC
  Filled 2019-06-12: qty 1

## 2019-06-12 MED ORDER — LABETALOL HCL 5 MG/ML IV SOLN
10.0000 mg | INTRAVENOUS | Status: DC | PRN
  Administered 2019-06-13 – 2019-06-14 (×4): 10 mg via INTRAVENOUS
  Filled 2019-06-12 (×5): qty 4

## 2019-06-12 MED ORDER — LABETALOL HCL 5 MG/ML IV SOLN
10.0000 mg | INTRAVENOUS | Status: DC | PRN
  Administered 2019-06-12: 03:00:00 10 mg via INTRAVENOUS
  Filled 2019-06-12: qty 4

## 2019-06-12 MED ORDER — DEXTROSE 50 % IV SOLN
1.0000 | Freq: Once | INTRAVENOUS | Status: AC
  Administered 2019-06-12: 02:00:00 50 mL via INTRAVENOUS
  Filled 2019-06-12: qty 50

## 2019-06-12 MED ORDER — CHLORHEXIDINE GLUCONATE CLOTH 2 % EX PADS
6.0000 | MEDICATED_PAD | Freq: Every day | CUTANEOUS | Status: DC
  Administered 2019-06-12 – 2019-06-14 (×3): 6 via TOPICAL

## 2019-06-12 MED ORDER — CEFAZOLIN SODIUM-DEXTROSE 1-4 GM/50ML-% IV SOLN
INTRAVENOUS | Status: DC | PRN
  Administered 2019-06-12: 1 g via INTRAVENOUS

## 2019-06-12 MED ORDER — ALBUTEROL SULFATE (2.5 MG/3ML) 0.083% IN NEBU
2.5000 mg | INHALATION_SOLUTION | Freq: Once | RESPIRATORY_TRACT | Status: AC
  Administered 2019-06-12: 02:00:00 2.5 mg via RESPIRATORY_TRACT
  Filled 2019-06-12: qty 3

## 2019-06-12 MED ORDER — SODIUM CHLORIDE 0.45 % IV SOLN: INTRAVENOUS | Status: DC

## 2019-06-12 MED ORDER — LIDOCAINE HCL (PF) 2 % IJ SOLN
INTRAMUSCULAR | Status: AC
  Filled 2019-06-12: qty 5

## 2019-06-12 MED ORDER — ROCURONIUM BROMIDE 100 MG/10ML IV SOLN
INTRAVENOUS | Status: DC | PRN
  Administered 2019-06-12: 100 mg via INTRAVENOUS

## 2019-06-12 MED ORDER — ORAL CARE MOUTH RINSE
15.0000 mL | Freq: Two times a day (BID) | OROMUCOSAL | Status: DC
  Administered 2019-06-12 – 2019-06-14 (×6): 15 mL via OROMUCOSAL

## 2019-06-12 MED ORDER — FENTANYL CITRATE (PF) 100 MCG/2ML IJ SOLN
INTRAMUSCULAR | Status: DC | PRN
  Administered 2019-06-12 (×2): 25 ug via INTRAVENOUS

## 2019-06-12 MED ORDER — SODIUM BICARBONATE 8.4 % IV SOLN
50.0000 meq | Freq: Once | INTRAVENOUS | Status: AC
  Administered 2019-06-12: 02:00:00 50 meq via INTRAVENOUS
  Filled 2019-06-12: qty 50

## 2019-06-12 MED ORDER — ONDANSETRON HCL 4 MG/2ML IJ SOLN
4.0000 mg | Freq: Four times a day (QID) | INTRAMUSCULAR | Status: DC | PRN
  Administered 2019-06-12: 4 mg via INTRAVENOUS

## 2019-06-12 MED ORDER — HEPARIN SODIUM (PORCINE) 5000 UNIT/ML IJ SOLN
5000.0000 [IU] | Freq: Three times a day (TID) | INTRAMUSCULAR | Status: DC
  Administered 2019-06-12 – 2019-06-15 (×9): 5000 [IU] via SUBCUTANEOUS
  Filled 2019-06-12 (×10): qty 1

## 2019-06-12 MED ORDER — ONDANSETRON HCL 4 MG/2ML IJ SOLN
INTRAMUSCULAR | Status: AC
  Filled 2019-06-12: qty 2

## 2019-06-12 MED ORDER — ONDANSETRON HCL 4 MG PO TABS: 4.0000 mg | ORAL_TABLET | Freq: Four times a day (QID) | ORAL | Status: DC | PRN

## 2019-06-12 MED ORDER — FENTANYL CITRATE (PF) 100 MCG/2ML IJ SOLN: 25.0000 ug | INTRAMUSCULAR | Status: DC | PRN

## 2019-06-12 MED ORDER — ONDANSETRON HCL 4 MG/2ML IJ SOLN
4.0000 mg | Freq: Once | INTRAMUSCULAR | Status: AC
  Administered 2019-06-12: 03:00:00 4 mg via INTRAVENOUS
  Filled 2019-06-12: qty 2

## 2019-06-12 MED ORDER — PATIROMER SORBITEX CALCIUM 8.4 G PO PACK
8.4000 g | PACK | Freq: Every day | ORAL | Status: DC
  Administered 2019-06-13: 08:00:00 8.4 g via ORAL
  Filled 2019-06-12 (×2): qty 1

## 2019-06-12 MED ORDER — DEXTROSE 50 % IV SOLN
INTRAVENOUS | Status: AC
  Administered 2019-06-12: 06:00:00 50 mL via INTRAVENOUS
  Filled 2019-06-12: qty 50

## 2019-06-12 MED ORDER — DEXAMETHASONE SODIUM PHOSPHATE 10 MG/ML IJ SOLN
INTRAMUSCULAR | Status: DC | PRN
  Administered 2019-06-12: 10 mg via INTRAVENOUS

## 2019-06-12 MED ORDER — DEXTROSE 50 % IV SOLN: 1.0000 | Freq: Once | INTRAVENOUS | Status: AC

## 2019-06-12 MED ORDER — PROPOFOL 10 MG/ML IV BOLUS
INTRAVENOUS | Status: AC
  Filled 2019-06-12: qty 40

## 2019-06-12 SURGICAL SUPPLY — 34 items
BAG DRAIN CYSTO-URO LG1000N (MISCELLANEOUS) ×3 IMPLANT
BAG URINE DRAIN 2000ML AR STRL (UROLOGICAL SUPPLIES) ×1 IMPLANT
BLADE SURG 11 STRL SS SAFETY (MISCELLANEOUS) ×1 IMPLANT
CATH FOL 2WAY LX 16X5 (CATHETERS) ×1 IMPLANT
CATH FOLEY 2W COUNCIL 20FR 5CC (CATHETERS) IMPLANT
CATH FOLEY 2WAY  5CC 16FR (CATHETERS)
CATH SET URETHRAL DILATOR (CATHETERS) ×3 IMPLANT
CATH URTH 16FR FL 2W BLN LF (CATHETERS) ×1 IMPLANT
COVER WAND RF STERILE (DRAPES) ×3 IMPLANT
ELECT REM PT RETURN 9FT ADLT (ELECTROSURGICAL)
ELECTRODE REM PT RTRN 9FT ADLT (ELECTROSURGICAL) ×1 IMPLANT
GLOVE BIO SURGEON STRL SZ8 (GLOVE) ×3 IMPLANT
GOWN STRL REUS W/ TWL LRG LVL3 (GOWN DISPOSABLE) ×2 IMPLANT
GOWN STRL REUS W/ TWL XL LVL3 (GOWN DISPOSABLE) ×2 IMPLANT
GOWN STRL REUS W/TWL LRG LVL3 (GOWN DISPOSABLE) ×4
GOWN STRL REUS W/TWL XL LVL3 (GOWN DISPOSABLE) ×4
GUIDEWIRE STR DUAL SENSOR (WIRE) ×3 IMPLANT
HOLDER FOLEY CATH W/STRAP (MISCELLANEOUS) ×3 IMPLANT
NDL HYPO 25X1 1.5 SAFETY (NEEDLE) ×1 IMPLANT
NDL SPNL 18GX3.5 QUINCKE PK (NEEDLE) ×1 IMPLANT
NEEDLE HYPO 25X1 1.5 SAFETY (NEEDLE) IMPLANT
NEEDLE SPNL 18GX3.5 QUINCKE PK (NEEDLE) IMPLANT
PACK CYSTO AR (MISCELLANEOUS) ×3 IMPLANT
PAD PREP 24X41 OB/GYN DISP (PERSONAL CARE ITEMS) ×3 IMPLANT
SET CYSTO W/LG BORE CLAMP LF (SET/KITS/TRAYS/PACK) ×3 IMPLANT
SOL .9 NS 3000ML IRR  AL (IV SOLUTION) ×2
SOL .9 NS 3000ML IRR UROMATIC (IV SOLUTION) ×1 IMPLANT
SPONGE DRAIN TRACH 4X4 STRL 2S (GAUZE/BANDAGES/DRESSINGS) ×1 IMPLANT
SUT SILK 0 SH 30 (SUTURE) ×1 IMPLANT
SYR 10ML LL (SYRINGE) ×2 IMPLANT
SYR 30ML LL (SYRINGE) ×1 IMPLANT
SYRINGE IRR TOOMEY STRL 70CC (SYRINGE) ×1 IMPLANT
WATER STERILE IRR 1000ML POUR (IV SOLUTION) ×3 IMPLANT
WATER STERILE IRR 3000ML UROMA (IV SOLUTION) ×3 IMPLANT

## 2019-06-12 NOTE — Transfer of Care (Signed)
Immediate Anesthesia Transfer of Care Note  Patient: [name] [name]  Procedure(s) Performed: CYSTOSCOPY WITH URETHRAL DILATATION (N/A ) INSERTION OF SUPRAPUBIC CATHETER (N/A )  Patient Location: PACU  Anesthesia Type:General  Level of Consciousness: drowsy  Airway & Oxygen Therapy: Patient Spontanous Breathing and Patient connected to face mask oxygen  Post-op Assessment: Report given to RN and Post -op Vital signs reviewed and stable  Post vital signs: Reviewed and stable  Last Vitals:  Vitals Value Taken Time  BP 153/82 06/12/19 1402  Temp 36.8 C 06/12/19 1402  Pulse 84 06/12/19 1408  Resp 16 06/12/19 1408  SpO2 100 % 06/12/19 1408  Vitals shown include unvalidated device data.  Last Pain:  Vitals:   06/12/19 1240  TempSrc: Tympanic  PainSc:          Complications: No apparent anesthesia complications

## 2019-06-12 NOTE — H&P (Addendum)
History and Physical    [name] [name] DOB: 11-05-1940 DOA: 06/11/2019  PCP: Jerrol Banana., MD   Patient coming from: home I have personally briefly reviewed patient's old medical records in [place]  Chief Complaint: weakness x 1 day  HPI: [name] [name] is a 79 y.o. female with medical history significant for hypertension, CAD and breast cancer on letrozole who presents to the emergency room with a 1 day history of generalized weakness, inability to ambulate per her usual.  She denies associated numbness or tingling in the face or extremities the weakness is not her whole body, not one-sided.  She denies headache or blurred vision, denies chest pain, shortness of breath.  She denies GI or GU complaints  ED Course: On arrival in the emergency room blood pressure was 212/101 with otherwise normal vitals.  Blood work remarkable for creatinine of 12.98 up from 0.91 with potassium of 7.5.  She had no peaked T waves.  Treatment was started in the emergency room with calcium gluconate, albuterol, insulin and dextrose.  Hospitalist consulted for admission.  CT abdomen and pelvis to evaluate for obstructive cause pending, subsequently showed hydronephrosis  Review of Systems: As per HPI otherwise 10 point review of systems negative.    Past Medical History:  Diagnosis Date  . Breast cancer ([place]) 2018   Right breast  . 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 Mucin noted on biopsy, not on wide excision.  . Coronary artery disease   . GERD (gastroesophageal reflux disease)   . Hyperlipidemia   . Hypertension   . 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 Dr. Kellie Moor DX by punch bioppsy  . Lichen planus    right breast   . Lichen planus   . MI (myocardial infarction) ([place])    2015  . Personal history of radiation therapy 09/2016   RIGHT lumpectomy w/ radiation    Past  Surgical History:  Procedure Laterality Date  . ABDOMINAL HYSTERECTOMY    . APPENDECTOMY    . BREAST BIOPSY Right 07/23/2016   INVASIVE MAMMARY CARCINOMA WITH AREAS OF EXTRACELLULAR MUCIN  . BREAST EXCISIONAL BIOPSY     INVASIVE MUCINOUS MAMMARY CARCINOMA.   Marland Kitchen BREAST LUMPECTOMY Right 08/20/2016   INVASIVE MUCINOUS MAMMARY CARCINOMA. /Grade 2   . COLONOSCOPY  2016  . COLONOSCOPY N/A 09/06/2018   Procedure: COLONOSCOPY;  Surgeon: Virgel Manifold, MD;  Location: [facility] ENDOSCOPY;  Service: Endoscopy;  Laterality: N/A;  . CORONARY ANGIOPLASTY WITH STENT PLACEMENT    . CYSTOSCOPY    . ESOPHAGOGASTRODUODENOSCOPY N/A 09/06/2018   Procedure: ESOPHAGOGASTRODUODENOSCOPY (EGD);  Surgeon: Virgel Manifold, MD;  Location: [facility] ENDOSCOPY;  Service: Endoscopy;  Laterality: N/A;  . OOPHORECTOMY    . PARTIAL MASTECTOMY WITH AXILLARY SENTINEL LYMPH NODE BIOPSY Right 08/09/2016   Procedure: PARTIAL MASTECTOMY WITH AXILLARY SENTINEL LYMPH NODE BIOPSY;  Surgeon: Robert Bellow, MD;  Location: [facility] ORS;  Service: General;  Laterality: Right;  . TONSILLECTOMY       reports that she quit smoking about 21 years ago. Her smoking use included cigarettes. She has a 2.50 pack-year smoking history. She has never used smokeless tobacco. She reports that she does not drink alcohol or use drugs.  Allergies  Allergen Reactions  . Celebrex  [Celecoxib]     Dark stools.  . Penicillins Other (See Comments)    Childhood reaction Has patient had  a PCN reaction causing immediate rash, facial/tongue/throat swelling, SOB or lightheadedness with hypotension: Unknown Has patient had a PCN reaction causing severe rash involving mucus membranes or skin necrosis: Unknown Has patient had a PCN reaction that required hospitalization: Unknown Has patient had a PCN reaction occurring within the last 10 years: Unknown If all of the above answers are "NO", then may proceed with Cephalosporin use.   Marland Kitchen Prevacid [Lansoprazole]  Diarrhea    Family History  Problem Relation Age of Onset  . Hyperlipidemia Mother   . Allergies Mother   . Cerebral aneurysm Mother        cause of death at age 24  . Heart disease Father        Fatal MI ag 47  . Cancer Brother        lung cancer  . Hyperlipidemia Brother   . Colonic polyp Brother   . Healthy Son   . Cancer - Lung Brother        colon  . Healthy Son   . Breast cancer Neg Hx      Prior to Admission medications   Medication Sig Start Date End Date Taking? Authorizing Provider  Calcium Carb-Cholecalciferol (CALCIUM 500+D PO) Take by mouth every other day.    [provider]  letrozole (FEMARA) 2.5 MG tablet Take 1 tablet (2.5 mg total) by mouth daily. 05/31/19   Lloyd Huger, MD  lisinopril (ZESTRIL) 20 MG tablet Take 20 mg by mouth daily. 05/29/19   [provider]  meloxicam (MOBIC) 7.5 MG tablet Take 7.5 mg by mouth daily. 12/21/18   [provider]    Physical Exam: Vitals:   06/11/19 1849 06/11/19 1850 06/11/19 2327  BP: (!) 212/101  (!) 210/102  Pulse: 95  98  Resp: 16  (!) 26  Temp: 98.3 F (36.8 C)    TempSrc: Oral    SpO2: 100%  100%  Weight:  55.8 kg   Height:  5\' 5"  (1.651 m)      Vitals:   06/11/19 1849 06/11/19 1850 06/11/19 2327  BP: (!) 212/101  (!) 210/102  Pulse: 95  98  Resp: 16  (!) 26  Temp: 98.3 F (36.8 C)    TempSrc: Oral    SpO2: 100%  100%  Weight:  55.8 kg   Height:  5\' 5"  (1.651 m)     Constitutional: Alert and awake, oriented x3, not in any acute distress but appears weak. Eyes: PERLA, EOMI, irises appear normal, anicteric sclera,  ENMT: external ears and nose appear normal, normal hearing             Lips appears normal, oropharynx mucosa, tongue, posterior pharynx appear normal  Neck: neck appears normal, no masses, normal ROM, no thyromegaly, no JVD  CVS: S1-S2 clear, no murmur rubs or gallops,  , no carotid bruits, pedal pulses palpable, No LE edema Respiratory:  clear to  auscultation bilaterally, no wheezing, rales or rhonchi. Respiratory effort normal. No accessory muscle use.  Abdomen: soft nontender, nondistended, normal bowel sounds, no hepatosplenomegaly, no hernias Musculoskeletal: : no cyanosis, clubbing , no contractures or atrophy Neuro: Cranial nerves II-XII intact, sensation, reflexes normal, strength Psych: judgement and insight appear normal, stable mood and affect,  Skin: no rashes or lesions or ulcers, no induration or nodules   Labs on Admission: I have personally reviewed following labs and imaging studies  CBC: Recent Labs  Lab 06/11/19 1909  WBC 8.9  HGB 10.0*  HCT 32.5*  MCV 86.0  PLT 99991111   Basic Metabolic Panel: Recent Labs  Lab 06/11/19 1909 06/11/19 2357  NA 139 140  K 7.2* >7.5*  CL 104 104  CO2 16* 17*  GLUCOSE 89 87  BUN 98* 97*  CREATININE 13.01* 12.98*  CALCIUM 8.8* 8.9   GFR: Estimated Creatinine Clearance: 3 mL/min (A) (by C-G formula based on SCr of 12.98 mg/dL (H)). Liver Function Tests: Recent Labs  Lab 06/11/19 2357  AST 15  ALT 12  ALKPHOS 50  BILITOT 1.0  PROT 7.5  ALBUMIN 3.9   No results for input(s): LIPASE, AMYLASE in the last 168 hours. No results for input(s): AMMONIA in the last 168 hours. Coagulation Profile: No results for input(s): INR, PROTIME in the last 168 hours. Cardiac Enzymes: No results for input(s): CKTOTAL, CKMB, CKMBINDEX, TROPONINI in the last 168 hours. BNP (last 3 results) No results for input(s): PROBNP in the last 8760 hours. HbA1C: No results for input(s): HGBA1C in the last 72 hours. CBG: No results for input(s): GLUCAP in the last 168 hours. Lipid Profile: No results for input(s): CHOL, HDL, LDLCALC, TRIG, CHOLHDL, LDLDIRECT in the last 72 hours. Thyroid Function Tests: No results for input(s): TSH, T4TOTAL, FREET4, T3FREE, THYROIDAB in the last 72 hours. Anemia Panel: No results for input(s): VITAMINB12, FOLATE, FERRITIN, TIBC, IRON, RETICCTPCT in the  last 72 hours. Urine analysis:    Component Value Date/Time   COLORURINE STRAW (A) 09/12/2018 1003   APPEARANCEUR CLEAR (A) 09/12/2018 1003   APPEARANCEUR Cloudy (A) 04/25/2015 1449   LABSPEC 1.009 09/12/2018 1003   LABSPEC 1.021 01/17/2014 1524   PHURINE 5.0 09/12/2018 1003   GLUCOSEU NEGATIVE 09/12/2018 1003   GLUCOSEU Negative 01/17/2014 1524   HGBUR NEGATIVE 09/12/2018 1003   BILIRUBINUR NEGATIVE 09/12/2018 1003   BILIRUBINUR neg 06/30/2016 1619   BILIRUBINUR Negative 04/25/2015 1449   BILIRUBINUR Negative 01/17/2014 1524   KETONESUR 5 (A) 09/12/2018 1003   PROTEINUR NEGATIVE 09/12/2018 1003   UROBILINOGEN 0.2 06/30/2016 1619   NITRITE NEGATIVE 09/12/2018 1003   LEUKOCYTESUR NEGATIVE 09/12/2018 1003   LEUKOCYTESUR 2+ 01/17/2014 1524    Radiological Exams on Admission: DG Chest Port 1 View  Result Date: 06/11/2019 CLINICAL DATA:  Weakness. EXAM: PORTABLE CHEST 1 VIEW COMPARISON:  March 31, 2018 FINDINGS: The heart size is stable. Aortic calcifications are noted. There is a large hiatal hernia. There is no pneumothorax. No significant pleural effusion. There is some atelectasis at the lung bases. There is no acute osseous abnormality. IMPRESSION: No acute cardiopulmonary disease. Large hiatal hernia. Electronically Signed   By: Constance Holster M.D.   On: 06/11/2019 23:52    EKG: Independently reviewed.   Assessment/Plan Active Problems:   AKI   Hyperkalemia and Metabolic Aidosis   Generalized weakness   Bilateral hydronephrosis and hydroureter with bladder diverticuli -Patient presented with generalized weakness with markedly elevated creatinine of 12.98 from a previous normal baseline of 0.91 and hyperkalemia 7.5 -Etiology possibly related to hypertensive urgency in combination with home lisinopril and meloxicam -Follow-up CT abdomen to evaluate for obstructive etiology (addendum following admission;CT: Moderate to marked severity bilateral hydronephrosis  and Hydroureter) -Continue calcium gluconate, albuterol, insulin and dextrose as well as sodium bicarbonate ordered from the emergency room -Patient also got a dose of Kayexalate in the emergency room -IV hydration with D5 with bicarb -DC lisinopril and meloxicam -Monitor serum sodium -Continuous cardiac monitoring -Nephrology consult.   -Urology consult  hypertensive urgency -Blood pressure in the emergency room 212/101 -Hold home  lisinopril -Labetalol IV to keep systolic under 0000000    CAD (coronary artery disease) -No complaints of chest pain no acute ST-T wave changes -Not currently on aspirin, beta-blocker or statin -   History of breast cancer -continue letrozole    DVT prophylaxis: Lovenox Code Status: DNR  Family Communication:  Husband at bedside Disposition Plan: Back to previous home environment Consults called: renal, urology  Status:inp    Athena Masse MD [facility]     06/12/2019, 1:47 AM

## 2019-06-12 NOTE — Progress Notes (Addendum)
PROGRESS NOTE    [name] [name]  [id number] DOB: 12-04-1940 DOA: 06/11/2019 PCP: Jerrol Banana., MD   Brief Narrative:  [name] [name] is a 79 y.o. female with medical history significant for hypertension, CAD and breast cancer on letrozole who presents to the emergency room with a 1 day history of generalized weakness, inability to ambulate per her usual.  She denies associated numbness or tingling in the face or extremities the weakness is not her whole body, not one-sided.  She denies headache or blurred vision, denies chest pain, shortness of breath.  She denies GI or GU complaints.  She was found to have creatinine of 13 with baseline below 1.  Abdomen and pelvis with bilateral hydronephrosis and distended ureter and bladder and potassium of 7.5.  Urology was consulted, multiple failed attempts to place Foley.  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  Subjective: Was somnolent when seen today.  She was responding to painful stimuli.  Per nursing staff she was alert and awake about an hour ago.  Assessment & Plan:   Active Problems:   CAD (coronary artery disease)   History of breast cancer   AKI (acute kidney injury) ([facility])   Generalized weakness   Hyperkalemia   Hypertensive urgency  AKI with hyperkalemia and metabolic acidosis.  Most likely secondary to bladder outlet obstruction resulted in markedly distended bladder and ureters along with bilateral hydronephrosis.  Multiple failed attempts to place Foley by nursing staff and urology.  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  Urethra was dilated and a Foley was inserted which should remain in for 2 weeks and she will follow up with urology as an outpatient. Urine cultures were sent-pending results. Hyperkalemia resolved with calcium gluconate, albuterol, insulin and dextrose. Metabolic  acidosis improving. -Continue to monitor renal function. -Avoid nephrotoxins. -Nephrology was consulted-appreciate their recommendations.  hypertensive urgency.  Resolved.  Currently blood pressure within goal. -Blood pressure in the emergency room 212/101 -Hold home lisinopril due to AKI. -Labetalol IV to keep systolic under 0000000    CAD (coronary artery disease) -No complaints of chest pain no acute ST-T wave changes -Not currently on aspirin, beta-blocker or statin -   History of breast cancer -continue letrozole  Objective: Vitals:   06/12/19 1438 06/12/19 1447 06/12/19 1503 06/12/19 1533  BP: 114/70 113/68 (!) 150/84 (!) 165/97  Pulse: 88 85 83 89  Resp: 15 12 11 18   Temp:   98.4 F (36.9 C) 98.9 F (37.2 C)  TempSrc:    Oral  SpO2: 100% 94% 100% 100%  Weight:    60.4 kg  Height:    5\' 2"  (1.575 m)    Intake/Output Summary (Last 24 hours) at 06/12/2019 1632 Last data filed at 06/12/2019 1438 Gross per 24 hour  Intake 500 ml  Output 2155 ml  Net -1655 ml   Filed Weights   06/11/19 1850 06/12/19 1533  Weight: 55.8 kg 60.4 kg    Examination:  General exam: Appears calm and comfortable.  Somnolent, responding with sternal rub.  Not following much commands. Respiratory system: Clear to auscultation. Respiratory effort normal. Cardiovascular system: S1 & S2 heard, RRR. No JVD, murmurs, rubs, gallops or clicks. Gastrointestinal system: Soft, nontender, nondistended, bowel sounds positive. Central nervous system: Somnolent and not following much commands. Extremities: No edema, no cyanosis, pulses intact and symmetrical. Skin: No rashes,  lesions or ulcers Psychiatry: Judgement and insight appear impaired.   DVT prophylaxis: Heparin Code Status: DNR Family Communication: Husband was updated at bedside Disposition Plan: Pending improvement in her renal function.  Most likely will go back home.  We will obtain PT/OT evaluation for home health needs.  Consultants:    Urology  Nephrology  Procedures:  Antimicrobials:   Data Reviewed: I have personally reviewed following labs and imaging studies  CBC: Recent Labs  Lab 06/11/19 1909 06/12/19 0534  WBC 8.9 9.4  HGB 10.0* 8.9*  HCT 32.5* 29.0*  MCV 86.0 85.5  PLT 265 [id number]   Basic Metabolic Panel: Recent Labs  Lab 06/11/19 1909 06/11/19 2357 06/12/19 0534  NA 139 140 145  K 7.2* >7.5* 4.5  CL 104 104 114*  CO2 16* 17* 17*  GLUCOSE 89 87 35*  BUN 98* 97* 82*  CREATININE 13.01* 12.98* 10.35*  CALCIUM 8.8* 8.9 7.7*   GFR: Estimated Creatinine Clearance: 3.7 mL/min (A) (by C-G formula based on SCr of 10.35 mg/dL (H)). Liver Function Tests: Recent Labs  Lab 06/11/19 2357  AST 15  ALT 12  ALKPHOS 50  BILITOT 1.0  PROT 7.5  ALBUMIN 3.9   No results for input(s): LIPASE, AMYLASE in the last 168 hours. No results for input(s): AMMONIA in the last 168 hours. Coagulation Profile: No results for input(s): INR, PROTIME in the last 168 hours. Cardiac Enzymes: No results for input(s): CKTOTAL, CKMB, CKMBINDEX, TROPONINI in the last 168 hours. BNP (last 3 results) No results for input(s): PROBNP in the last 8760 hours. HbA1C: No results for input(s): HGBA1C in the last 72 hours. CBG: Recent Labs  Lab 06/12/19 0747 06/12/19 0951 06/12/19 1209 06/12/19 1408 06/12/19 1529  GLUCAP 120* 113* 100* 105* 93   Lipid Profile: No results for input(s): CHOL, HDL, LDLCALC, TRIG, CHOLHDL, LDLDIRECT in the last 72 hours. Thyroid Function Tests: No results for input(s): TSH, T4TOTAL, FREET4, T3FREE, THYROIDAB in the last 72 hours. Anemia Panel: No results for input(s): VITAMINB12, FOLATE, FERRITIN, TIBC, IRON, RETICCTPCT in the last 72 hours. Sepsis Labs: No results for input(s): PROCALCITON, LATICACIDVEN in the last 168 hours.  Recent Results (from the past 240 hour(s))  Respiratory Panel by RT PCR (Flu A&B, Covid) - Nasopharyngeal Swab     Status: None   Collection Time: 06/11/19  11:57 PM   Specimen: Nasopharyngeal Swab  Result Value Ref Range Status   SARS Coronavirus 2 by RT PCR NEGATIVE NEGATIVE Final    Comment: (NOTE) SARS-CoV-2 target nucleic acids are NOT DETECTED. The SARS-CoV-2 RNA is generally detectable in upper respiratoy specimens during the acute phase of infection. The lowest concentration of SARS-CoV-2 viral copies this assay can detect is 131 copies/mL. A negative result does not preclude SARS-Cov-2 infection and should not be used as the sole basis for treatment or other patient management decisions. A negative result may occur with  improper specimen collection/handling, submission of specimen other than nasopharyngeal swab, presence of viral mutation(s) within the areas targeted by this assay, and inadequate number of viral copies (<131 copies/mL). A negative result must be combined with clinical observations, patient history, and epidemiological information. The expected result is Negative. Fact Sheet for Patients:  PinkCheek.be Fact Sheet for Healthcare Providers:  GravelBags.it This test is not yet ap proved or cleared by the [place] FDA and  has been authorized for detection and/or diagnosis of SARS-CoV-2 by FDA under an Emergency Use Authorization (EUA). This EUA will remain  in effect (meaning  this test can be used) for the duration of the COVID-19 declaration under Section 564(b)(1) of the Act, 21 U.S.C. section 360bbb-3(b)(1), unless the authorization is terminated or revoked sooner.    Influenza A by PCR NEGATIVE NEGATIVE Final   Influenza B by PCR NEGATIVE NEGATIVE Final    Comment: (NOTE) The Xpert Xpress SARS-CoV-2/FLU/RSV assay is intended as an aid in  the diagnosis of influenza from Nasopharyngeal swab specimens and  should not be used as a sole basis for treatment. Nasal washings and  aspirates are unacceptable for Xpert Xpress SARS-CoV-2/FLU/RSV   testing. Fact Sheet for Patients: PinkCheek.be Fact Sheet for Healthcare Providers: GravelBags.it This test is not yet approved or cleared by the [place] FDA and  has been authorized for detection and/or diagnosis of SARS-CoV-2 by  FDA under an Emergency Use Authorization (EUA). This EUA will remain  in effect (meaning this test can be used) for the duration of the  Covid-19 declaration under Section 564(b)(1) of the Act, 21  U.S.C. section 360bbb-3(b)(1), unless the authorization is  terminated or revoked. Performed at [facility], [address] [place], [place] [address]      Radiology Studies: [facility] Chest [place] 1 View  Result Date: 06/11/2019 CLINICAL DATA:  Weakness. EXAM: PORTABLE CHEST 1 VIEW COMPARISON:  March 31, 2018 FINDINGS: The heart size is stable. Aortic calcifications are noted. There is a large hiatal hernia. There is no pneumothorax. No significant pleural effusion. There is some atelectasis at the lung bases. There is no acute osseous abnormality. IMPRESSION: No acute cardiopulmonary disease. Large hiatal hernia. Electronically Signed   By: Constance Holster M.D.   On: 06/11/2019 23:52   CT Renal Stone Study  Result Date: 06/12/2019 CLINICAL DATA:  Weakness. EXAM: CT ABDOMEN AND PELVIS WITHOUT CONTRAST TECHNIQUE: Multidetector CT imaging of the abdomen and pelvis was performed following the standard protocol without IV contrast. COMPARISON:  January 21, 2015 FINDINGS: Lower chest: Mild atelectasis is seen within the right lung base. Hepatobiliary: A diminutive left lobe of the liver is noted. Stable 3.0 cm x 2.5 cm, 6.1 cm x 4.5 cm and 3.7 cm x 2.6 cm cysts are seen within the anterior aspect of the right lobe of the liver. An additional 3.9 cm x 2.9 cm area of mildly increased attenuation is noted within the anteromedial aspect of the right lobe. This is seen on the prior study and is decreased in  size on the current exam. There is a mild amount of perihepatic fluid. The gallbladder is poorly visualized and may be contracted. Pancreas: Pancreas is normal in appearance without evidence of pancreatic ductal dilatation. Spleen: A mild amount of perisplenic fluid is seen along the anterior and lateral aspects of an otherwise normal-appearing spleen. Adrenals/Urinary Tract: Adrenal glands are unremarkable. The kidneys are normal in size, without renal calculi or focal lesions. Moderate to marked severity bilateral hydronephrosis and hydroureter are seen. The urinary bladder is markedly distended. Multiple large urinary bladder diverticula are seen. These are increased in size when compared to the prior study. The largest measures approximately 10.0 cm x 9.2 cm and is located along the anterolateral aspect of the urinary bladder on the right. Stomach/Bowel: A very large gastric hernia is seen. This is present on the prior study. The appendix is not clearly identified. No evidence of bowel dilatation. Numerous noninflamed diverticula are seen throughout the large bowel. A marked amount of inflammatory fat stranding is seen within the abdomen on the right. This surrounds the proximal  portion of the duodenum and extends inferiorly within the right abdomen along the anterior and posterior pararenal spaces. Vascular/Lymphatic: There is marked severity aortic calcification. No enlarged abdominal or pelvic lymph nodes. Reproductive: Status post hysterectomy. No adnexal masses. Other: A mild-to-moderate amount of fluid is seen within the lateral aspect of the abdomen on the right. This extends inferiorly into the right hemipelvis. A small amount of posterior pelvic free fluid is noted. Musculoskeletal: Multilevel degenerative changes seen throughout the lumbar spine. IMPRESSION: 1. Marked severity inflammatory process within the right abdomen within extensive amount of associated inflammatory fat stranding, as described  above. Given the pattern of inflammatory stranding, this is likely consistent with marked severity duodenitis, however, correlation with a contrast-enhanced CT evaluation of the abdomen and pelvis is recommended. 2. Colonic diverticulosis. 3. Moderate to marked severity bilateral hydronephrosis and hydroureter 4. Mild-to-moderate amount of fluid is seen within the lateral aspect of the abdomen on the right. This extends inferiorly into the right hemipelvis. 5. Large gastric hernia. 6. Stable hepatic cysts. 7. Stable 3.9 cm x 2.9 cm area of mildly increased attenuation within the anteromedial aspect of the right lobe of the liver which may represent a previously collapsed hepatic cyst. 8. Multiple large urinary bladder diverticula, increased in size when compared to the prior study. Aortic Atherosclerosis (ICD10-I70.0). Electronically Signed   By: Virgina Norfolk M.D.   On: 06/12/2019 03:02    Scheduled Meds: . Chlorhexidine Gluconate Cloth  6 each Topical Daily  . heparin  5,000 Units Subcutaneous Q8H  . mouth rinse  15 mL Mouth Rinse BID  . patiromer  8.4 g Oral Daily   Continuous Infusions: . sodium bicarbonate 150 mEq in dextrose 5% 1000 mL 150 mEq (06/12/19 1551)     LOS: 0 days   Time spent: 40 minutes.  Lorella Nimrod, MD [facility]  If 7PM-7AM, please contact night-coverage [email or web address]  06/12/2019, 4:32 PM   This record has been created using Dragon voice recognition software. Errors have been sought and corrected,but may not always be located. Such creation errors do not reflect on the standard of care.

## 2019-06-12 NOTE — ED Notes (Signed)
Attempted x2 to insert foley cathter without success

## 2019-06-12 NOTE — ED Notes (Signed)
Pt minimally responsive at glucose check. Pt responsive post-d50 amp

## 2019-06-12 NOTE — Progress Notes (Signed)
[name] [name] has acute kidney injury with creatinine >10, severe bilateral hydronephrosis and significant bladder distention.  Attempts at placing a Foley catheter in ED by Dr. Erlene Quan were unsuccessful secondary to urethral stricture and patient discomfort.  Will attempt ureteral dilation with catheter placement in OR under sedation.  If not successful we will plan on placement of percutaneous SP tube.  Her son has consented for the procedure.  Her husband had no further questions.

## 2019-06-12 NOTE — ED Notes (Signed)
This RN entered room due to IV alarm beeping.  Patient was found standing at the side of the bed, with her incontinence pad slightly off and obvious stool on the pad.  This RN and Deneise Lever, RN cleaned patient and assisted patient back to the bed after changing the incontinence pad. This RN placed patient on a bed alarm and notified patient's primary RN.

## 2019-06-12 NOTE — ED Notes (Signed)
Pt ambulatory with asssistance to the bathroom at this time. Pt placed in clean brief with clean chux pads placed on bed. Pt denies further needs. Family remains at bedside.

## 2019-06-12 NOTE — Consult Note (Signed)
Urology Consult  I have been asked to see the patient by Dr. Damita Dunnings, for evaluation and management of urinary retention with acute renal failure and difficult Foley placement.  Chief Complaint: Weakness  History of Present Illness: [name] [name] is a 79 y.o. year old female who presented to the ED overnight with reports of a several day long history of weakness.  She was subsequently found to be hypertensive with a markedly elevated creatinine of 12.98 (baseline 0.8-0.9).  CTAP without contrast revealed a distended urinary bladder with multiple diverticula with moderate to severe bilateral hydroureteronephrosis.  Nursing failed 2 attempts to place Foley catheter at the bedside.  Patient is significantly altered today, minimally verbally responsive, minimally responsive to verbal commands, and unable to contribute to HPI.  Per chart review, patient has a history of chronic urinary retention with elevated PVRs ~3103mL, urethral stricture s/p dilation in 2016 and 2017, microscopic hematuria, and E. coli UTIs.  She was seen most recently in our clinic by Dr. Erlene Quan on 04/24/2018 with plans for follow-up in 3 months for cystoscopy.  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  She was lost to follow-up thereafter.  Past Medical History:  Diagnosis Date  . Breast cancer ([place]) 2018   Right breast  . 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 Mucin noted on biopsy, not on wide excision.  . Coronary artery disease   . GERD (gastroesophageal reflux disease)   . Hyperlipidemia   . Hypertension   . 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 Dr. Kellie Moor DX by punch bioppsy  . Lichen planus    right breast   . Lichen planus   . MI (myocardial infarction) ([place])    2015  . Personal history of radiation therapy 09/2016   RIGHT lumpectomy w/ radiation    Past  Surgical History:  Procedure Laterality Date  . ABDOMINAL HYSTERECTOMY    . APPENDECTOMY    . BREAST BIOPSY Right 07/23/2016   INVASIVE MAMMARY CARCINOMA WITH AREAS OF EXTRACELLULAR MUCIN  . BREAST EXCISIONAL BIOPSY     INVASIVE MUCINOUS MAMMARY CARCINOMA.   Marland Kitchen BREAST LUMPECTOMY Right 08/20/2016   INVASIVE MUCINOUS MAMMARY CARCINOMA. /Grade 2   . COLONOSCOPY  2016  . COLONOSCOPY N/A 09/06/2018   Procedure: COLONOSCOPY;  Surgeon: Virgel Manifold, MD;  Location: [facility] ENDOSCOPY;  Service: Endoscopy;  Laterality: N/A;  . CORONARY ANGIOPLASTY WITH STENT PLACEMENT    . CYSTOSCOPY    . ESOPHAGOGASTRODUODENOSCOPY N/A 09/06/2018   Procedure: ESOPHAGOGASTRODUODENOSCOPY (EGD);  Surgeon: Virgel Manifold, MD;  Location: [facility] ENDOSCOPY;  Service: Endoscopy;  Laterality: N/A;  . OOPHORECTOMY    . PARTIAL MASTECTOMY WITH AXILLARY SENTINEL LYMPH NODE BIOPSY Right 08/09/2016   Procedure: PARTIAL MASTECTOMY WITH AXILLARY SENTINEL LYMPH NODE BIOPSY;  Surgeon: Robert Bellow, MD;  Location: [facility] ORS;  Service: General;  Laterality: Right;  . TONSILLECTOMY      Home Medications:  Current Meds  Medication Sig  . Calcium Carb-Cholecalciferol (CALCIUM 500+D PO) Take by mouth every other day.  . lisinopril (ZESTRIL) 20 MG tablet Take 20 mg by mouth daily.  . meloxicam (MOBIC) 7.5 MG tablet Take 7.5 mg by mouth daily.  Marland Kitchen omeprazole (PRILOSEC) 20 MG capsule Take 20 mg by mouth daily as needed.    Allergies:  Allergies  Allergen Reactions  . Celebrex  [Celecoxib]     Dark stools.  Marland Kitchen  Penicillins Other (See Comments)    Childhood reaction Has patient had a PCN reaction causing immediate rash, facial/tongue/throat swelling, SOB or lightheadedness with hypotension: Unknown Has patient had a PCN reaction causing severe rash involving mucus membranes or skin necrosis: Unknown Has patient had a PCN reaction that required hospitalization: Unknown Has patient had a PCN reaction occurring within the last  10 years: Unknown If all of the above answers are "NO", then may proceed with Cephalosporin use.   Marland Kitchen Prevacid [Lansoprazole] Diarrhea    Family History  Problem Relation Age of Onset  . Hyperlipidemia Mother   . Allergies Mother   . Cerebral aneurysm Mother        cause of death at age 29  . Heart disease Father        Fatal MI ag 44  . Cancer Brother        lung cancer  . Hyperlipidemia Brother   . Colonic polyp Brother   . Healthy Son   . Cancer - Lung Brother        colon  . Healthy Son   . Breast cancer Neg Hx     Social History:  reports that she quit smoking about 21 years ago. Her smoking use included cigarettes. She has a 2.50 pack-year smoking history. She has never used smokeless tobacco. She reports that she does not drink alcohol or use drugs.  ROS: A complete review of systems was performed.  All systems are negative except for pertinent findings as noted.  Physical Exam:  Vital signs in last 24 hours: Temp:  [98.3 F (36.8 C)] 98.3 F (36.8 C) (04/05 1849) Pulse Rate:  [95-98] 98 (04/05 2327) Resp:  [16-26] 24 (04/06 0700) BP: (112-212)/(81-108) 112/85 (04/06 0700) SpO2:  [100 %] 100 % (04/05 2327) Weight:  [55.8 kg] 55.8 kg (04/05 1850) Constitutional: See HPI, no acute distress HEENT: [place] AT, moist mucus membranes Cardiovascular: No clubbing, cyanosis, or edema. Respiratory: Normal respiratory effort GI: Distended abdomen, no grimace elicited with palpation GU: Severe vaginal adhesions fusing the labia minora Skin: No rashes, bruises or suspicious lesions Neurologic: Grossly intact, no focal deficits, moving all 4 extremities Psychiatric: See HPI  Laboratory Data:  Recent Labs    06/11/19 1909 06/12/19 0534  WBC 8.9 9.4  HGB 10.0* 8.9*  HCT 32.5* 29.0*   Recent Labs    06/11/19 1909 06/11/19 2357 06/12/19 0534  NA 139 140 145  K 7.2* >7.5* 4.5  CL 104 104 114*  CO2 16* 17* 17*  GLUCOSE 89 87 35*  BUN 98* 97* 82*  CREATININE 13.01*  12.98* 10.35*  CALCIUM 8.8* 8.9 7.7*   Urinalysis    Component Value Date/Time   COLORURINE STRAW (A) 09/12/2018 1003   APPEARANCEUR CLEAR (A) 09/12/2018 1003   APPEARANCEUR Cloudy (A) 04/25/2015 1449   LABSPEC 1.009 09/12/2018 1003   LABSPEC 1.021 01/17/2014 1524   PHURINE 5.0 09/12/2018 1003   GLUCOSEU NEGATIVE 09/12/2018 1003   GLUCOSEU Negative 01/17/2014 1524   HGBUR NEGATIVE 09/12/2018 1003   BILIRUBINUR NEGATIVE 09/12/2018 1003   BILIRUBINUR neg 06/30/2016 1619   BILIRUBINUR Negative 04/25/2015 1449   BILIRUBINUR Negative 01/17/2014 1524   KETONESUR 5 (A) 09/12/2018 1003   PROTEINUR NEGATIVE 09/12/2018 1003   UROBILINOGEN 0.2 06/30/2016 1619   NITRITE NEGATIVE 09/12/2018 1003   LEUKOCYTESUR NEGATIVE 09/12/2018 1003   LEUKOCYTESUR 2+ 01/17/2014 1524   Results for orders placed or performed during the hospital encounter of 06/11/19  Respiratory Panel by RT PCR (  Flu A&B, Covid) - Nasopharyngeal Swab     Status: None   Collection Time: 06/11/19 11:57 PM   Specimen: Nasopharyngeal Swab  Result Value Ref Range Status   SARS Coronavirus 2 by RT PCR NEGATIVE NEGATIVE Final    Comment: (NOTE) SARS-CoV-2 target nucleic acids are NOT DETECTED. The SARS-CoV-2 RNA is generally detectable in upper respiratoy specimens during the acute phase of infection. The lowest concentration of SARS-CoV-2 viral copies this assay can detect is 131 copies/mL. A negative result does not preclude SARS-Cov-2 infection and should not be used as the sole basis for treatment or other patient management decisions. A negative result may occur with  improper specimen collection/handling, submission of specimen other than nasopharyngeal swab, presence of viral mutation(s) within the areas targeted by this assay, and inadequate number of viral copies (<131 copies/mL). A negative result must be combined with clinical observations, patient history, and epidemiological information. The expected result is  Negative. Fact Sheet for Patients:  PinkCheek.be Fact Sheet for Healthcare Providers:  GravelBags.it This test is not yet ap proved or cleared by the [place] FDA and  has been authorized for detection and/or diagnosis of SARS-CoV-2 by FDA under an Emergency Use Authorization (EUA). This EUA will remain  in effect (meaning this test can be used) for the duration of the COVID-19 declaration under Section 564(b)(1) of the Act, 21 U.S.C. section 360bbb-3(b)(1), unless the authorization is terminated or revoked sooner.    Influenza A by PCR NEGATIVE NEGATIVE Final   Influenza B by PCR NEGATIVE NEGATIVE Final    Comment: (NOTE) The Xpert Xpress SARS-CoV-2/FLU/RSV assay is intended as an aid in  the diagnosis of influenza from Nasopharyngeal swab specimens and  should not be used as a sole basis for treatment. Nasal washings and  aspirates are unacceptable for Xpert Xpress SARS-CoV-2/FLU/RSV  testing. Fact Sheet for Patients: PinkCheek.be Fact Sheet for Healthcare Providers: GravelBags.it This test is not yet approved or cleared by the [place] FDA and  has been authorized for detection and/or diagnosis of SARS-CoV-2 by  FDA under an Emergency Use Authorization (EUA). This EUA will remain  in effect (meaning this test can be used) for the duration of the  Covid-19 declaration under Section 564(b)(1) of the Act, 21  U.S.C. section 360bbb-3(b)(1), unless the authorization is  terminated or revoked. Performed at [facility], [address] [place], [place] [address]     Radiologic Imaging: CT Renal Stone Study  Result Date: 06/12/2019 CLINICAL DATA:  Weakness. EXAM: CT ABDOMEN AND PELVIS WITHOUT CONTRAST TECHNIQUE: Multidetector CT imaging of the abdomen and pelvis was performed following the standard protocol without IV contrast. COMPARISON:  January 21, 2015 FINDINGS: Lower chest: Mild atelectasis is seen within the right lung base. Hepatobiliary: A diminutive left lobe of the liver is noted. Stable 3.0 cm x 2.5 cm, 6.1 cm x 4.5 cm and 3.7 cm x 2.6 cm cysts are seen within the anterior aspect of the right lobe of the liver. An additional 3.9 cm x 2.9 cm area of mildly increased attenuation is noted within the anteromedial aspect of the right lobe. This is seen on the prior study and is decreased in size on the current exam. There is a mild amount of perihepatic fluid. The gallbladder is poorly visualized and may be contracted. Pancreas: Pancreas is normal in appearance without evidence of pancreatic ductal dilatation. Spleen: A mild amount of perisplenic fluid is seen along the anterior and lateral aspects of an otherwise normal-appearing  spleen. Adrenals/Urinary Tract: Adrenal glands are unremarkable. The kidneys are normal in size, without renal calculi or focal lesions. Moderate to marked severity bilateral hydronephrosis and hydroureter are seen. The urinary bladder is markedly distended. Multiple large urinary bladder diverticula are seen. These are increased in size when compared to the prior study. The largest measures approximately 10.0 cm x 9.2 cm and is located along the anterolateral aspect of the urinary bladder on the right. Stomach/Bowel: A very large gastric hernia is seen. This is present on the prior study. The appendix is not clearly identified. No evidence of bowel dilatation. Numerous noninflamed diverticula are seen throughout the large bowel. A marked amount of inflammatory fat stranding is seen within the abdomen on the right. This surrounds the proximal portion of the duodenum and extends inferiorly within the right abdomen along the anterior and posterior pararenal spaces. Vascular/Lymphatic: There is marked severity aortic calcification. No enlarged abdominal or pelvic lymph nodes. Reproductive: Status post hysterectomy. No adnexal  masses. Other: A mild-to-moderate amount of fluid is seen within the lateral aspect of the abdomen on the right. This extends inferiorly into the right hemipelvis. A small amount of posterior pelvic free fluid is noted. Musculoskeletal: Multilevel degenerative changes seen throughout the lumbar spine. IMPRESSION: 1. Marked severity inflammatory process within the right abdomen within extensive amount of associated inflammatory fat stranding, as described above. Given the pattern of inflammatory stranding, this is likely consistent with marked severity duodenitis, however, correlation with a contrast-enhanced CT evaluation of the abdomen and pelvis is recommended. 2. Colonic diverticulosis. 3. Moderate to marked severity bilateral hydronephrosis and hydroureter 4. Mild-to-moderate amount of fluid is seen within the lateral aspect of the abdomen on the right. This extends inferiorly into the right hemipelvis. 5. Large gastric hernia. 6. Stable hepatic cysts. 7. Stable 3.9 cm x 2.9 cm area of mildly increased attenuation within the anteromedial aspect of the right lobe of the liver which may represent a previously collapsed hepatic cyst. 8. Multiple large urinary bladder diverticula, increased in size when compared to the prior study. Aortic Atherosclerosis (ICD10-I70.0). Electronically Signed   By: Virgina Norfolk M.D.   On: 06/12/2019 03:02   I personally reviewed the imaging above and note the presence of a distended urinary bladder with multiple diverticula as well as moderate to severe bilateral hydroureteronephrosis.  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  I attempted to place a 12 French silicone Foley catheter at the bedside this  morning with the assistance of RN Deneise Lever.  Patient was minimally cooperative with this.  Ultimately, I was able to insert the catheter less than 1 cm into the urethral meatus before meeting significant resistance.  Given her history, I strongly suspected recurrence of urethral stricture at that point.  I stopped the procedure with plans for reattempt over guidewire versus possible operative approach later in the day.  I returned to the bedside later this morning with Dr. Erlene Quan, who attempted to place a Foley catheter over guidewire at the bedside.  We were also unsuccessful with this attempt largely due to patient's poor cooperation with the procedure.  Ultimately, the plan for bedside bladder decompression was abandoned in favor of cystoscopy with urethral dilation and Foley catheter placement versus suprapubic catheter placement under sedation in the OR.  We will  proceed with procedure later today.  Thank you for involving me in this patient's care, I will continue to follow along.  Debroah Loop, PA-C 06/12/2019 9:22 AM

## 2019-06-12 NOTE — ED Notes (Signed)
Attempted to contact husband x4 times to obtain informed consent. Son, [name] Couillard consents to procedure.

## 2019-06-12 NOTE — Anesthesia Preprocedure Evaluation (Addendum)
Anesthesia Evaluation  Patient identified by MRN, date of birth, ID band Patient awake    Reviewed: Allergy & Precautions, H&P , NPO status , Patient's Chart, lab work & pertinent test results  Airway Mallampati: II  TM Distance: >3 FB Neck ROM: full    Dental  (+) Teeth Intact   Pulmonary former smoker,     + decreased breath sounds      Cardiovascular hypertension, + CAD, + Past MI and + Peripheral Vascular Disease   Rhythm:regular Rate:Normal     Neuro/Psych negative neurological ROS  negative psych ROS   GI/Hepatic Neg liver ROS, hiatal hernia, PUD, GERD  ,  Endo/Other  diabetes  Renal/GU ARFRenal disease   H/o urethral stricture, now complete obstruction    Musculoskeletal  (+) Arthritis ,   Abdominal   Peds  Hematology  (+) Blood dyscrasia, anemia ,   Anesthesia Other Findings Pt confused. Phone consent obtained from husband.  Past Medical History: 2018: Breast cancer [facility])     Comment:  Right breast 07/23/2016: Cancer ([place])     Comment:  T1b, N0; ER/PR+, 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 Mucin noted on biopsy, not on wide excision. No date: Coronary artery disease No date: GERD (gastroesophageal reflux disease) No date: Hyperlipidemia No date: Hypertension [id number]: Lichen planus     Comment:  Right breast in field of whole breast radiation. Dr.               Kellie Moor DX by punch bioppsy No date: Lichen planus     Comment:  right breast  No date: Lichen planus No date: MI (myocardial infarction) [facility])     Comment:  2015 09/2016: Personal history of radiation therapy     Comment:  RIGHT lumpectomy w/ radiation  Past Surgical History: No date: ABDOMINAL HYSTERECTOMY No date: APPENDECTOMY 07/23/2016: BREAST BIOPSY; Right     Comment:  INVASIVE MAMMARY CARCINOMA WITH AREAS OF EXTRACELLULAR               MUCIN No date: BREAST EXCISIONAL BIOPSY     Comment:   INVASIVE MUCINOUS MAMMARY CARCINOMA.  08/20/2016: BREAST LUMPECTOMY; Right     Comment:  INVASIVE MUCINOUS MAMMARY CARCINOMA. /Grade 2  2016: COLONOSCOPY 09/06/2018: COLONOSCOPY; N/A     Comment:  Procedure: COLONOSCOPY;  Surgeon: Virgel Manifold,               MD;  Location: [facility] ENDOSCOPY;  Service: Endoscopy;                Laterality: N/A; No date: CORONARY ANGIOPLASTY WITH STENT PLACEMENT No date: CYSTOSCOPY 09/06/2018: ESOPHAGOGASTRODUODENOSCOPY; N/A     Comment:  Procedure: ESOPHAGOGASTRODUODENOSCOPY (EGD);  Surgeon:               Virgel Manifold, MD;  Location: [facility] ENDOSCOPY;                Service: Endoscopy;  Laterality: N/A; No date: OOPHORECTOMY 08/09/2016: PARTIAL MASTECTOMY WITH AXILLARY SENTINEL LYMPH NODE  BIOPSY; Right     Comment:  Procedure: PARTIAL MASTECTOMY WITH AXILLARY SENTINEL               LYMPH NODE BIOPSY;  Surgeon: Robert Bellow, MD;                Location: [facility] ORS;  Service: General;  Laterality:  Right; No date: TONSILLECTOMY  BMI    Body Mass Index: 20.47 kg/m      Reproductive/Obstetrics negative OB ROS                            Anesthesia Physical Anesthesia Plan  ASA: III and emergent  Anesthesia Plan: General ETT and Rapid Sequence   Post-op Pain Management:    Induction:   PONV Risk Score and Plan: Ondansetron, Dexamethasone and Treatment may vary due to age or medical condition  Airway Management Planned:   Additional Equipment:   Intra-op Plan:   Post-operative Plan:   Informed Consent: I have reviewed the patients History and Physical, chart, labs and discussed the procedure including the risks, benefits and alternatives for the proposed anesthesia with the patient or authorized representative who has indicated his/her understanding and acceptance.     Dental Advisory Given  Plan Discussed with: Anesthesiologist  Anesthesia Plan Comments:         Anesthesia Quick  Evaluation

## 2019-06-12 NOTE — ED Notes (Signed)
Urology at bedside to attempt to place foley. Unable to pass 12 F catheter. Per PA, Dr. Bernardo Heater will attempt to come place cath at some point this morning. Urology cart brought up and placed at bedside. Patient resting comfortably and in no apparent distress at this time.

## 2019-06-12 NOTE — Op Note (Signed)
Preoperative diagnosis:  1. Acute renal failure 2. Urinary retention 3. Urethral stricture  Postoperative diagnosis:  1. Same  Procedure: 1. Cystoscopy with urethral dilation  Surgeon: Abbie Sons, MD  Anesthesia: General  Complications: None  Intraoperative findings:  1.  Proximal urethral stricture ~8FR 2.  Distended bladder with severe trabeculation; large superior bladder diverticulum, right bladder wall diverticulum superiorly and left bladder wall diverticulum inferiorly 3.  No solid or papillary lesions; squamous metaplasia trigone  EBL: Minimal  Specimens: Catheterized urine for culture  Indication: [name] [name] is a 79 y.o. female who presented to the ED with a creatinine of 12 and potassium 7.5.  CT showed severe, bilateral hydronephrosis/hydroureter and marked bladder distention.  She has a history of urethral stricture and chronic urinary retention.  No urologic follow-up in 3+ years.  Attempts at Foley catheter placement in the ED were unsuccessful.  After reviewing the management options for treatment, he elected to proceed with the above surgical procedure(s). We have discussed the potential benefits and risks of the procedure, side effects of the proposed treatment, the likelihood of the patient achieving the goals of the procedure, and any potential problems that might occur during the procedure or recuperation. Informed consent has been obtained.  Description of procedure:  The patient was taken to the operating room and general anesthesia was induced.  The patient was placed in the dorsal lithotomy position, prepped and draped in the usual sterile fashion, and preoperative antibiotics were administered. A preoperative time-out was performed.   A 4.5 French semirigid ureteroscope was lubricated and passed per urethra.  The proximal urethra a narrowed area was identified which the ureteroscope easily traversed.  Once in the bladder a 0.038 sensor wire was  placed through the ureteroscope and advanced.  The ureteroscope was removed and over the wire urethral dilation was performed from 8-20 [place].  A 17 French cystoscope was then passed alongside the wire and into the bladder without difficulty.  Panendoscopy was performed with findings as described above.  Approximately 1500 mL of urine was drained from the bladder on cystoscope and reduction.  A 20 French Foley catheter was placed without difficulty and the balloon inflated with 10 cc of sterile water.  The catheter was placed to gravity drainage and the guidewire was removed.  After anesthetic reversal patient was transported the PACU in stable condition.  Recommendation: -Leave catheter indwelling for 10-14 days -Office follow-up for catheter removal/voiding trial   Abbie Sons, M.D.

## 2019-06-12 NOTE — OR Nursing (Signed)
This RN spoke with spouse , denies any further questions or concerns with surgery. Dr. Bernardo Heater at bedside . Son, Lovelynn Riley gave consent for surgery over the phone, his number is [phone]

## 2019-06-12 NOTE — Anesthesia Procedure Notes (Signed)
Procedure Name: Intubation Date/Time: 06/12/2019 1:20 PM Performed by: Gentry Fitz, CRNA Pre-anesthesia Checklist: Patient identified, Emergency Drugs available, Suction available and Patient being monitored Patient Re-evaluated:Patient Re-evaluated prior to induction Oxygen Delivery Method: Circle system utilized Preoxygenation: Pre-oxygenation with 100% oxygen Induction Type: IV induction Ventilation: Mask ventilation without difficulty Laryngoscope Size: McGraph and 4 Grade View: Grade I Tube type: Oral Tube size: 7.0 mm Number of attempts: 1 Airway Equipment and Method: Stylet Placement Confirmation: ETT inserted through vocal cords under direct vision,  positive ETCO2 and breath sounds checked- equal and bilateral Secured at: 22 cm Tube secured with: Tape Dental Injury: Teeth and Oropharynx as per pre-operative assessment

## 2019-06-12 NOTE — Telephone Encounter (Signed)
Requested medication (s) are due for refill today: yes  Requested medication (s) are on the active medication list: yes  Last refill: 03/11/19  Future visit scheduled: yes  Notes to clinic:  historical provider    Requested Prescriptions  Pending Prescriptions Disp Refills   omeprazole (PRILOSEC) 20 MG capsule [Pharmacy Med Name: OMEPRAZOLE DR 20 MG CAPSULE] 90 capsule 1    Sig: TAKE 1 CAPSULE (20 MG TOTAL) BY MOUTH DAILY AS NEEDED (INDIGESTION).      Gastroenterology: Proton Pump Inhibitors Passed - 06/12/2019  7:17 AM      Passed - Valid encounter within last 12 months    Recent Outpatient Visits           2 months ago Type 2 diabetes mellitus without complication, unspecified whether long term insulin use [facility])   [facility] Jerrol Banana., MD   5 months ago Primary osteoarthritis of knee, unspecified laterality   [facility] Jerrol Banana., MD   5 months ago Acute pain of right knee   [facility] Jerrol Banana., MD   6 months ago Visual disturbances   [facility] Jerrol Banana., MD   7 months ago Lower GI bleeding   [facility] Jerrol Banana., MD       Future Appointments             In 3 weeks Jerrol Banana., MD [facility], [place]

## 2019-06-12 NOTE — Progress Notes (Signed)
Received patient from PACU, She was able to state name, location, and that she had to have surgery. No pain complaints. UOP good, 2LNC, IV Fluids started per order. Patient was lethargic this afternoon, but responded to voice and let RN complete mouthcare for her. Husband updated at bedside.

## 2019-06-12 NOTE — Progress Notes (Signed)
[facility]  CONSULT NOTE    Date: 06/12/2019                  Patient Name:  [name] [name]  MRN: [id number]  DOB: 01-Jan-1941  Age / Sex: 79 y.o., female         PCP: Jerrol Banana., MD                 Service Requesting Consult: Dr. Damita Dunnings                 Reason for Consult: Acute renal failure            History of Present Illness: [name] [name] presents to the Emergency department with weakness. Found to have acute renal failure with severe hyperkalemia. Patent with altered mental status and unable to give a history. She was treated for her hyperkalemia with dextrose, insulin, veltassa, sodium bicarbonate and calcium gluconate. She was found to have urinary obstruction. ED was not able to place foley catheter. Urology then tried at bedside and were unsuccessful.    Medications: Outpatient medications: Medications Prior to Admission  Medication Sig Dispense Refill Last Dose  . Calcium Carb-Cholecalciferol (CALCIUM 500+D PO) Take by mouth every other day.   prn at prn  . lisinopril (ZESTRIL) 20 MG tablet Take 20 mg by mouth daily.   06/10/2019 at 1100  . meloxicam (MOBIC) 7.5 MG tablet Take 7.5 mg by mouth daily.   06/10/2019 at 1100  . omeprazole (PRILOSEC) 20 MG capsule TAKE 1 CAPSULE (20 MG TOTAL) BY MOUTH DAILY AS NEEDED (INDIGESTION). 90 capsule 1     Current medications: Current Facility-Administered Medications  Medication Dose Route Frequency Provider Last Rate Last Admin  . [MAR Hold] heparin injection 5,000 Units  5,000 Units Subcutaneous Q8H Athena Masse, MD   5,000 Units at 06/12/19 0331  . [MAR Hold] labetalol (NORMODYNE) injection 10 mg  10 mg Intravenous Q2H PRN Athena Masse, MD   10 mg at 06/12/19 0302  . [MAR Hold] ondansetron (ZOFRAN) tablet 4 mg  4 mg Oral Q6H PRN Athena Masse, MD       Or  . Doug Sou Hold] ondansetron [facility]) injection 4 mg  4 mg Intravenous Q6H PRN Athena Masse, MD      . Doug Sou Hold] patiromer  [facility]) packet 8.4 g  8.4 g Oral Daily Paulette Blanch, MD      . sodium bicarbonate 150 mEq in dextrose 5% 1000 mL infusion  150 mEq Intravenous Continuous Athena Masse, MD   Stopped at 06/12/19 1212      Allergies: Allergies  Allergen Reactions  . Celebrex  [Celecoxib]     Dark stools.  . Penicillins Other (See Comments)    Childhood reaction Has patient had a PCN reaction causing immediate rash, facial/tongue/throat swelling, SOB or lightheadedness with hypotension: Unknown Has patient had a PCN reaction causing severe rash involving mucus membranes or skin necrosis: Unknown Has patient had a PCN reaction that required hospitalization: Unknown Has patient had a PCN reaction occurring within the last 10 years: Unknown If all of the above answers are "NO", then may proceed with Cephalosporin use.   Marland Kitchen Prevacid [Lansoprazole] Diarrhea      Past Medical History: Past Medical History:  Diagnosis Date  . Breast cancer ([place]) 2018   Right breast  . 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 Mucin  noted on biopsy, not on wide excision.  . Coronary artery disease   . GERD (gastroesophageal reflux disease)   . Hyperlipidemia   . Hypertension   . 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 Dr. Kellie Moor DX by punch bioppsy  . Lichen planus    right breast   . Lichen planus   . MI (myocardial infarction) ([place])    2015  . Personal history of radiation therapy 09/2016   RIGHT lumpectomy w/ radiation     Past Surgical History: Past Surgical History:  Procedure Laterality Date  . ABDOMINAL HYSTERECTOMY    . APPENDECTOMY    . BREAST BIOPSY Right 07/23/2016   INVASIVE MAMMARY CARCINOMA WITH AREAS OF EXTRACELLULAR MUCIN  . BREAST EXCISIONAL BIOPSY     INVASIVE MUCINOUS MAMMARY CARCINOMA.   Marland Kitchen BREAST LUMPECTOMY Right 08/20/2016   INVASIVE MUCINOUS MAMMARY CARCINOMA. /Grade 2   . COLONOSCOPY  2016   . COLONOSCOPY N/A 09/06/2018   Procedure: COLONOSCOPY;  Surgeon: Virgel Manifold, MD;  Location: [facility] ENDOSCOPY;  Service: Endoscopy;  Laterality: N/A;  . CORONARY ANGIOPLASTY WITH STENT PLACEMENT    . CYSTOSCOPY    . ESOPHAGOGASTRODUODENOSCOPY N/A 09/06/2018   Procedure: ESOPHAGOGASTRODUODENOSCOPY (EGD);  Surgeon: Virgel Manifold, MD;  Location[facility] ENDOSCOPY;  Service: Endoscopy;  Laterality: N/A;  . OOPHORECTOMY    . PARTIAL MASTECTOMY WITH AXILLARY SENTINEL LYMPH NODE BIOPSY Right 08/09/2016   Procedure: PARTIAL MASTECTOMY WITH AXILLARY SENTINEL LYMPH NODE BIOPSY;  Surgeon: Robert Bellow, MD;  Location: [facility] ORS;  Service: General;  Laterality: Right;  . TONSILLECTOMY       Family History: Family History  Problem Relation Age of Onset  . Hyperlipidemia Mother   . Allergies Mother   . Cerebral aneurysm Mother        cause of death at age 24  . Heart disease Father        Fatal MI ag 63  . Cancer Brother        lung cancer  . Hyperlipidemia Brother   . Colonic polyp Brother   . Healthy Son   . Cancer - Lung Brother        colon  . Healthy Son   . Breast cancer Neg Hx      Social History: Social History   Socioeconomic History  . Marital status: Married    Spouse name: [name]  . Number of children: 2  . Years of education: Not on file  . Highest education level: High school graduate  Occupational History  . Occupation: retired  Tobacco Use  . Smoking status: Former Smoker    Packs/day: 0.25    Years: 10.00    Pack years: 2.50    Types: Cigarettes    Quit date: 03/06/1998    Years since quitting: 21.2  . Smokeless tobacco: Never Used  Substance and Sexual Activity  . Alcohol use: No  . Drug use: No  . Sexual activity: Yes    Birth control/protection: Surgical  Other Topics Concern  . Not on file  Social History Narrative  . Not on file   Social Determinants of Health   Financial Resource Strain:   . Difficulty of Paying Living Expenses:    Food Insecurity:   . Worried About Charity fundraiser in the Last Year:   . Arboriculturist in the Last Year:   Transportation Needs:   . Film/video editor (Medical):   Marland Kitchen Lack of  Transportation (Non-Medical):   Physical Activity:   . Days of Exercise per Week:   . Minutes of Exercise per Session:   Stress:   . Feeling of Stress :   Social Connections:   . Frequency of Communication with Friends and Family:   . Frequency of Social Gatherings with Friends and Family:   . Attends Religious Services:   . Active Member of Clubs or Organizations:   . Attends Archivist Meetings:   Marland Kitchen Marital Status:   Intimate Partner Violence:   . Fear of Current or Ex-Partner:   . Emotionally Abused:   Marland Kitchen Physically Abused:   . Sexually Abused:      Review of Systems: Review of Systems  Unable to perform ROS: Mental status change    Vital Signs: Blood pressure (!) 183/100, pulse 92, temperature 99.2 F (37.3 C), temperature source Tympanic, resp. rate 18, height 5\' 5"  (1.651 m), weight 55.8 kg, SpO2 98 %.  Weight trends: Filed Weights   06/11/19 1850  Weight: 55.8 kg    Physical Exam: General: Ill appearing  Head: Normocephalic, atraumatic. Dry oral mucosal membranes  Eyes: Anicteric, PERRL  Neck: Supple, trachea midline  Lungs:  Clear to auscultation  Heart: Regular rate and rhythm  Abdomen:  Soft, nontender,   Extremities: no peripheral edema.  Neurologic: Nonfocal, moving all four extremities  Skin: No lesions         Lab results: Basic Metabolic Panel: Recent Labs  Lab 06/11/19 1909 06/11/19 2357 06/12/19 0534  NA 139 140 145  K 7.2* >7.5* 4.5  CL 104 104 114*  CO2 16* 17* 17*  GLUCOSE 89 87 35*  BUN 98* 97* 82*  CREATININE 13.01* 12.98* 10.35*  CALCIUM 8.8* 8.9 7.7*    Liver Function Tests: Recent Labs  Lab 06/11/19 2357  AST 15  ALT 12  ALKPHOS 50  BILITOT 1.0  PROT 7.5  ALBUMIN 3.9   No results for input(s): LIPASE, AMYLASE in the  last 168 hours. No results for input(s): AMMONIA in the last 168 hours.  CBC: Recent Labs  Lab 06/11/19 1909 06/12/19 0534  WBC 8.9 9.4  HGB 10.0* 8.9*  HCT 32.5* 29.0*  MCV 86.0 85.5  PLT 265 255    Cardiac Enzymes: No results for input(s): CKTOTAL, CKMB, CKMBINDEX, TROPONINI in the last 168 hours.  BNP: Invalid input(s): POCBNP  CBG: Recent Labs  Lab 06/12/19 0613 06/12/19 0630 06/12/19 0747 06/12/19 0951 06/12/19 1209  GLUCAP 40* 69* 120* 113* 100*    Microbiology: Results for orders placed or performed during the hospital encounter of 06/11/19  Respiratory Panel by RT PCR (Flu A&B, Covid) - Nasopharyngeal Swab     Status: None   Collection Time: 06/11/19 11:57 PM   Specimen: Nasopharyngeal Swab  Result Value Ref Range Status   SARS Coronavirus 2 by RT PCR NEGATIVE NEGATIVE Final    Comment: (NOTE) SARS-CoV-2 target nucleic acids are NOT DETECTED. The SARS-CoV-2 RNA is generally detectable in upper respiratoy specimens during the acute phase of infection. The lowest concentration of SARS-CoV-2 viral copies this assay can detect is 131 copies/mL. A negative result does not preclude SARS-Cov-2 infection and should not be used as the sole basis for treatment or other patient management decisions. A negative result may occur with  improper specimen collection/handling, submission of specimen other than nasopharyngeal swab, presence of viral mutation(s) within the areas targeted by this assay, and inadequate number of viral copies (<131 copies/mL). A negative result must be  combined with clinical observations, patient history, and epidemiological information. The expected result is Negative. Fact Sheet for Patients:  PinkCheek.be Fact Sheet for Healthcare Providers:  GravelBags.it This test is not yet ap proved or cleared by the [place] FDA and  has been authorized for detection and/or diagnosis of  SARS-CoV-2 by FDA under an Emergency Use Authorization (EUA). This EUA will remain  in effect (meaning this test can be used) for the duration of the COVID-19 declaration under Section 564(b)(1) of the Act, 21 U.S.C. section 360bbb-3(b)(1), unless the authorization is terminated or revoked sooner.    Influenza A by PCR NEGATIVE NEGATIVE Final   Influenza B by PCR NEGATIVE NEGATIVE Final    Comment: (NOTE) The Xpert Xpress SARS-CoV-2/FLU/RSV assay is intended as an aid in  the diagnosis of influenza from Nasopharyngeal swab specimens and  should not be used as a sole basis for treatment. Nasal washings and  aspirates are unacceptable for Xpert Xpress SARS-CoV-2/FLU/RSV  testing. Fact Sheet for Patients: PinkCheek.be Fact Sheet for Healthcare Providers: GravelBags.it This test is not yet approved or cleared by the [place] FDA and  has been authorized for detection and/or diagnosis of SARS-CoV-2 by  FDA under an Emergency Use Authorization (EUA). This EUA will remain  in effect (meaning this test can be used) for the duration of the  Covid-19 declaration under Section 564(b)(1) of the Act, 21  U.S.C. section 360bbb-3(b)(1), unless the authorization is  terminated or revoked. Performed at [facility], [address] [place], [place] [address]     Coagulation Studies: No results for input(s): LABPROT, INR in the last 72 hours.  Urinalysis: No results for input(s): COLORURINE, LABSPEC, PHURINE, GLUCOSEU, HGBUR, BILIRUBINUR, KETONESUR, PROTEINUR, UROBILINOGEN, NITRITE, LEUKOCYTESUR in the last 72 hours.  Invalid input(s): APPERANCEUR    Imaging: DG Chest Port 1 View  Result Date: 06/11/2019 CLINICAL DATA:  Weakness. EXAM: PORTABLE CHEST 1 VIEW COMPARISON:  March 31, 2018 FINDINGS: The heart size is stable. Aortic calcifications are noted. There is a large hiatal hernia. There is no pneumothorax. No  significant pleural effusion. There is some atelectasis at the lung bases. There is no acute osseous abnormality. IMPRESSION: No acute cardiopulmonary disease. Large hiatal hernia. Electronically Signed   By: Constance Holster M.D.   On: 06/11/2019 23:52   CT Renal Stone Study  Result Date: 06/12/2019 CLINICAL DATA:  Weakness. EXAM: CT ABDOMEN AND PELVIS WITHOUT CONTRAST TECHNIQUE: Multidetector CT imaging of the abdomen and pelvis was performed following the standard protocol without IV contrast. COMPARISON:  January 21, 2015 FINDINGS: Lower chest: Mild atelectasis is seen within the right lung base. Hepatobiliary: A diminutive left lobe of the liver is noted. Stable 3.0 cm x 2.5 cm, 6.1 cm x 4.5 cm and 3.7 cm x 2.6 cm cysts are seen within the anterior aspect of the right lobe of the liver. An additional 3.9 cm x 2.9 cm area of mildly increased attenuation is noted within the anteromedial aspect of the right lobe. This is seen on the prior study and is decreased in size on the current exam. There is a mild amount of perihepatic fluid. The gallbladder is poorly visualized and may be contracted. Pancreas: Pancreas is normal in appearance without evidence of pancreatic ductal dilatation. Spleen: A mild amount of perisplenic fluid is seen along the anterior and lateral aspects of an otherwise normal-appearing spleen. Adrenals/Urinary Tract: Adrenal glands are unremarkable. The kidneys are normal in size, without renal calculi or focal lesions. Moderate to marked  severity bilateral hydronephrosis and hydroureter are seen. The urinary bladder is markedly distended. Multiple large urinary bladder diverticula are seen. These are increased in size when compared to the prior study. The largest measures approximately 10.0 cm x 9.2 cm and is located along the anterolateral aspect of the urinary bladder on the right. Stomach/Bowel: A very large gastric hernia is seen. This is present on the prior study. The appendix is  not clearly identified. No evidence of bowel dilatation. Numerous noninflamed diverticula are seen throughout the large bowel. A marked amount of inflammatory fat stranding is seen within the abdomen on the right. This surrounds the proximal portion of the duodenum and extends inferiorly within the right abdomen along the anterior and posterior pararenal spaces. Vascular/Lymphatic: There is marked severity aortic calcification. No enlarged abdominal or pelvic lymph nodes. Reproductive: Status post hysterectomy. No adnexal masses. Other: A mild-to-moderate amount of fluid is seen within the lateral aspect of the abdomen on the right. This extends inferiorly into the right hemipelvis. A small amount of posterior pelvic free fluid is noted. Musculoskeletal: Multilevel degenerative changes seen throughout the lumbar spine. IMPRESSION: 1. Marked severity inflammatory process within the right abdomen within extensive amount of associated inflammatory fat stranding, as described above. Given the pattern of inflammatory stranding, this is likely consistent with marked severity duodenitis, however, correlation with a contrast-enhanced CT evaluation of the abdomen and pelvis is recommended. 2. Colonic diverticulosis. 3. Moderate to marked severity bilateral hydronephrosis and hydroureter 4. Mild-to-moderate amount of fluid is seen within the lateral aspect of the abdomen on the right. This extends inferiorly into the right hemipelvis. 5. Large gastric hernia. 6. Stable hepatic cysts. 7. Stable 3.9 cm x 2.9 cm area of mildly increased attenuation within the anteromedial aspect of the right lobe of the liver which may represent a previously collapsed hepatic cyst. 8. Multiple large urinary bladder diverticula, increased in size when compared to the prior study. Aortic Atherosclerosis (ICD10-I70.0). Electronically Signed   By: Virgina Norfolk M.D.   On: 06/12/2019 03:02      Assessment & Plan: [name] [name] is a  79 y.o. black female with hypertension, coronary artery disease, GERD, breast cancer, who was admitted to [facility] on 06/11/2019 for Hyperkalemia [E87.5] Generalized weakness [R53.1] AKI (acute kidney injury) ([place]) [N17.9] Acute renal failure, unspecified acute renal failure type ([place]) [N17.9]  1. Acute renal failure 2. Hyperkalemia 3. Metabolic acidosis 4. Obstructive uropathy 5. Anemia with kidney failure 6. Hypertensive urgency  - Urology to attempt ureteral dilation in the OR.  - No indication for dialysis at this time - Holding lisinopril and meloxicam.  - IV labetalol PRN     LOS: 0 Shyheim Tanney 4/6/20211:17 PM

## 2019-06-13 DIAGNOSIS — I160 Hypertensive urgency: Secondary | ICD-10-CM

## 2019-06-13 DIAGNOSIS — R339 Retention of urine, unspecified: Secondary | ICD-10-CM

## 2019-06-13 DIAGNOSIS — E875 Hyperkalemia: Secondary | ICD-10-CM

## 2019-06-13 DIAGNOSIS — R531 Weakness: Secondary | ICD-10-CM

## 2019-06-13 DIAGNOSIS — N179 Acute kidney failure, unspecified: Principal | ICD-10-CM

## 2019-06-13 DIAGNOSIS — N3592 Unspecified urethral stricture, female: Secondary | ICD-10-CM

## 2019-06-13 LAB — CBC
HCT: 27 % — ABNORMAL LOW (ref 36.0–46.0)
Hemoglobin: 8.4 g/dL — ABNORMAL LOW (ref 12.0–15.0)
MCH: 26.3 pg (ref 26.0–34.0)
MCHC: 31.1 g/dL (ref 30.0–36.0)
MCV: 84.4 fL (ref 80.0–100.0)
Platelets: 225 10*3/uL (ref 150–400)
RBC: 3.2 MIL/uL — ABNORMAL LOW (ref 3.87–5.11)
RDW: 20.4 % — ABNORMAL HIGH (ref 11.5–15.5)
WBC: 3.7 10*3/uL — ABNORMAL LOW (ref 4.0–10.5)
nRBC: 0 % (ref 0.0–0.2)

## 2019-06-13 LAB — RENAL FUNCTION PANEL
Albumin: 3.2 g/dL — ABNORMAL LOW (ref 3.5–5.0)
Anion gap: 11 (ref 5–15)
BUN: 54 mg/dL — ABNORMAL HIGH (ref 8–23)
CO2: 29 mmol/L (ref 22–32)
Calcium: 8.2 mg/dL — ABNORMAL LOW (ref 8.9–10.3)
Chloride: 107 mmol/L (ref 98–111)
Creatinine, Ser: 5.29 mg/dL — ABNORMAL HIGH (ref 0.44–1.00)
GFR calc Af Amer: 8 mL/min — ABNORMAL LOW (ref >60)
GFR calc non Af Amer: 7 mL/min — ABNORMAL LOW (ref >60)
Glucose, Bld: 168 mg/dL — ABNORMAL HIGH (ref 70–99)
Phosphorus: 4.9 mg/dL — ABNORMAL HIGH (ref 2.5–4.6)
Potassium: 3.6 mmol/L (ref 3.5–5.1)
Sodium: 147 mmol/L — ABNORMAL HIGH (ref 135–145)

## 2019-06-13 MED ORDER — KCL-LACTATED RINGERS 20 MEQ/L IV SOLN
INTRAVENOUS | Status: DC
  Filled 2019-06-13 (×2): qty 1000

## 2019-06-13 MED ORDER — BISACODYL 5 MG PO TBEC: 5.0000 mg | DELAYED_RELEASE_TABLET | Freq: Every day | ORAL | Status: DC | PRN

## 2019-06-13 MED ORDER — POTASSIUM CHLORIDE 2 MEQ/ML IV SOLN
INTRAVENOUS | Status: DC
  Filled 2019-06-13 (×3): qty 1000

## 2019-06-13 MED ORDER — ONDANSETRON HCL 4 MG/2ML IJ SOLN: 4.0000 mg | Freq: Four times a day (QID) | INTRAMUSCULAR | Status: DC | PRN

## 2019-06-13 MED ORDER — LACTATED RINGERS IV SOLN: INTRAVENOUS | Status: DC

## 2019-06-13 MED ORDER — FLEET ENEMA 7-19 GM/118ML RE ENEM: 1.0000 | ENEMA | Freq: Once | RECTAL | Status: DC | PRN

## 2019-06-13 MED ORDER — MAGNESIUM HYDROXIDE 400 MG/5ML PO SUSP
30.0000 mL | Freq: Every day | ORAL | Status: DC | PRN
  Filled 2019-06-13: qty 30

## 2019-06-13 MED ORDER — ACETAMINOPHEN 500 MG PO TABS
1000.0000 mg | ORAL_TABLET | Freq: Three times a day (TID) | ORAL | Status: DC | PRN
  Administered 2019-06-13 – 2019-06-14 (×2): 1000 mg via ORAL
  Filled 2019-06-13 (×2): qty 2

## 2019-06-13 NOTE — Anesthesia Postprocedure Evaluation (Signed)
Anesthesia Post Note  Patient: [name] [name]  Procedure(s) Performed: CYSTOSCOPY WITH URETHRAL DILATATION (N/A )  Patient location during evaluation: SICU Anesthesia Type: General Level of consciousness: awake and alert Pain management: pain level controlled Vital Signs Assessment: post-procedure vital signs reviewed and stable Respiratory status: spontaneous breathing and patient connected to nasal cannula oxygen Cardiovascular status: stable Postop Assessment: no apparent nausea or vomiting Anesthetic complications: no     Last Vitals:  Vitals:   06/13/19 0500 06/13/19 0600  BP: (!) 161/98 (!) 143/88  Pulse: 81 71  Resp: 17 19  Temp:    SpO2: 100% 100%    Last Pain:  Vitals:   06/13/19 0400  TempSrc: Oral  PainSc:                  [name]

## 2019-06-13 NOTE — Progress Notes (Addendum)
Urology Inpatient Progress Note  Subjective: [name] [name] is a 79 y.o. 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 Bernardo Heater.  Creatinine down today, 5.29.  Urine culture pending.  Foley catheter in place draining clear, yellow urine.  Today, patient reports feeling well following surgery.  She he did have one brief episode of bladder pain this afternoon that subsided on its own.  Otherwise, she has no acute complaints.  She does report a recent history of weak stream and difficulty urinating prior to her current hospitalization.  Current Facility-Administered Medications  Medication Dose Route Frequency Provider Last Rate Last Admin  . Chlorhexidine Gluconate Cloth 2 % PADS 6 each  6 each Topical Daily Stoioff, Ronda Fairly, MD   6 each at 06/13/19 0815  . heparin injection 5,000 Units  5,000 Units Subcutaneous Q8H Stoioff, Scott C, MD   5,000 Units at 06/13/19 1301  . labetalol (NORMODYNE) injection 10 mg  10 mg Intravenous Q2H PRN Lorella Nimrod, MD      . lactated ringers 1,000 mL with potassium chloride 20 mEq infusion   Intravenous Continuous Kolluru, Sarath, MD 100 mL/hr at 06/13/19 1030 New Bag at 06/13/19 1030  . MEDLINE mouth rinse  15 mL Mouth Rinse BID Stoioff, Scott C, MD   15 mL at 06/13/19 0815  . ondansetron (ZOFRAN) tablet 4 mg  4 mg Oral Q6H PRN Stoioff, Scott C, MD       Or  . ondansetron (ZOFRAN) injection 4 mg  4 mg Intravenous Q6H PRN Stoioff, Scott C, MD   4 mg at 06/12/19 1329     Objective: Vital signs in last 24 hours: Temp:  [97.6 F (36.4 C)-98.9 F (37.2 C)] 97.8 F (36.6 C) (04/07 1200) Pulse Rate:  [69-97] 79 (04/07 1400) Resp:  [10-24] 17 (04/07 1400) BP: (101-177)/(71-141) 133/103 (04/07 1400) SpO2:  [97 %-100 %] 100 % (04/07 1400) Weight:  [60.4 kg] 60.4 kg (04/06  1533)  Intake/Output from previous day: 04/06 0701 - 04/07 0700 In: 2183.9 [I.V.:2183.9] Out: [id number] [Urine:5950; Blood:5] Intake/Output this shift: Total I/O In: 100 [I.V.:100] Out: 1250 [Urine:1250]  Physical Exam Vitals and nursing note reviewed.  Constitutional:      General: She is not in acute distress.    Appearance: She is not ill-appearing, toxic-appearing or diaphoretic.  HENT:     Head: Normocephalic and atraumatic.  Pulmonary:     Effort: Pulmonary effort is normal. No respiratory distress.  Neurological:     Comments: Oriented to person, disoriented to place  Psychiatric:        Mood and Affect: Mood normal.        Behavior: Behavior normal.     Lab Results:  Recent Labs    06/12/19 0534 06/13/19 0404  WBC 9.4 3.7*  HGB 8.9* 8.4*  HCT 29.0* 27.0*  PLT 255 225   BMET Recent Labs    06/12/19 0534 06/13/19 0404  NA 145 147*  K 4.5 3.6  CL 114* 107  CO2 17* 29  GLUCOSE 35* 168*  BUN 82* 54*  CREATININE 10.35* 5.29*  CALCIUM 7.7* 8.2*   Assessment & Plan: 79 year old female with a history of urethral stricture and chronic retention now POD 1 from cystoscopy and urethral dilation with Dr. Bernardo Heater for management of acute on chronic urinary retention with acute renal failure.  Creatinine  rapidly decreasing following bladder decompression, reassuring for postrenal etiology.  She does report a painful bladder spasm this afternoon likely associated with her Foley catheter.  We will continue to follow urine cultures.  Recommendations: -B&O suppositories for management of painful bladder spasms -Continue to follow creatinine, consider repeat imaging if it fails to normalize -Continue to follow urine cultures  Debroah Loop, PA-C 06/13/2019

## 2019-06-13 NOTE — Progress Notes (Signed)
PROGRESS NOTE    [name] [name]  [id number] DOB: 09/17/1940 DOA: 06/11/2019 PCP: Jerrol Banana., MD   Brief Narrative:  [name] [name] is a 79 y.o. female with medical history significant for hypertension, CAD and breast cancer on letrozole who presents to the emergency room with a 1 day history of generalized weakness, inability to ambulate per her usual.  She denies associated numbness or tingling in the face or extremities the weakness is not her whole body, not one-sided.  She denies headache or blurred vision, denies chest pain, shortness of breath.  She denies GI or GU complaints.  She was found to have creatinine of 13 with baseline below 1.  Abdomen and pelvis with bilateral hydronephrosis and distended ureter and bladder and potassium of 7.5.  Urology was consulted, multiple failed attempts to place Foley.  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  Subjective: Patient was feeling much better when seen today.  She was eating her breakfast.  Denies any pain.  She does not remember what happened yesterday.  Assessment & Plan:   Active Problems:   CAD (coronary artery disease)   History of breast cancer   AKI (acute kidney injury) (HCC)   Generalized weakness   Hyperkalemia   Hypertensive urgency  AKI with hyperkalemia and metabolic acidosis.  Hyperkalemia and metabolic acidosis resolved.  Most likely secondary to bladder outlet obstruction resulted in markedly distended bladder and ureters along with bilateral hydronephrosis.  Multiple failed attempts to place Foley by nursing staff and urology.  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  Urethra was dilated and a Foley was inserted which should remain in for 2 weeks and she will follow up with urology as an outpatient. Urine function improving with creatinine dropped to 5 today. Urine  cultures were sent-pending results. Hyperkalemia resolved with calcium gluconate, albuterol, insulin and dextrose. Metabolic acidosis improving. -Continue to monitor renal function. -Avoid nephrotoxins. -Nephrology was consulted-appreciate their recommendations.  hypertensive urgency.  Resolved.  Currently blood pressure within goal. -Blood pressure in the emergency room 212/101 -Hold home lisinopril due to AKI. -Labetalol IV to keep systolic under 0000000    CAD (coronary artery disease) -No complaints of chest pain no acute ST-T wave changes -Not currently on aspirin, beta-blocker or statin -   History of breast cancer -continue letrozole  Objective: Vitals:   06/13/19 1200 06/13/19 1300 06/13/19 1400 06/13/19 1548  BP: (!) 144/83 (!) 148/94 (!) 133/103 (!) 206/85  Pulse: 84 89 79 89  Resp: 18 12 17 18   Temp: 97.8 F (36.6 C)     TempSrc: Oral     SpO2: 100% 100% 100% 98%  Weight:      Height:        Intake/Output Summary (Last 24 hours) at 06/13/2019 1633 Last data filed at 06/13/2019 1200 Gross per 24 hour  Intake 1783.97 ml  Output 5050 ml  Net -3266.03 ml   Filed Weights   06/11/19 1850 06/12/19 1533  Weight: 55.8 kg 60.4 kg    Examination:  General exam: Appears calm and comfortable.   Respiratory system: Clear to auscultation. Respiratory effort normal. Cardiovascular system: S1 & S2 heard, RRR. No JVD, murmurs, rubs, gallops or clicks. Gastrointestinal system: Soft, nontender, nondistended, bowel sounds positive. Central nervous system: Somnolent and not following much commands. Extremities: No edema, no cyanosis, pulses intact and symmetrical. Psychiatry:  Judgement and insight appear impaired.   DVT prophylaxis: Heparin Code Status: DNR Family Communication: No family at bedside.  Discussed with patient. Disposition Plan: Pending improvement in her renal function.  Most likely will go back home.  We will obtain PT/OT evaluation for home health  needs.  Consultants:   Urology  Nephrology  Procedures:  Antimicrobials:   Data Reviewed: I have personally reviewed following labs and imaging studies  CBC: Recent Labs  Lab 06/11/19 1909 06/12/19 0534 06/13/19 0404  WBC 8.9 9.4 3.7*  HGB 10.0* 8.9* 8.4*  HCT 32.5* 29.0* 27.0*  MCV 86.0 85.5 84.4  PLT 265 255 [id number]   Basic Metabolic Panel: Recent Labs  Lab 06/11/19 1909 06/11/19 2357 06/12/19 0534 06/13/19 0404  NA 139 140 145 147*  K 7.2* >7.5* 4.5 3.6  CL 104 104 114* 107  CO2 16* 17* 17* 29  GLUCOSE 89 87 35* 168*  BUN 98* 97* 82* 54*  CREATININE 13.01* 12.98* 10.35* 5.29*  CALCIUM 8.8* 8.9 7.7* 8.2*  PHOS  --   --   --  4.9*   GFR: Estimated Creatinine Clearance: 7.3 mL/min (A) (by C-G formula based on SCr of 5.29 mg/dL (H)). Liver Function Tests: Recent Labs  Lab 06/11/19 2357 06/13/19 0404  AST 15  --   ALT 12  --   ALKPHOS 50  --   BILITOT 1.0  --   PROT 7.5  --   ALBUMIN 3.9 3.2*   No results for input(s): LIPASE, AMYLASE in the last 168 hours. No results for input(s): AMMONIA in the last 168 hours. Coagulation Profile: No results for input(s): INR, PROTIME in the last 168 hours. Cardiac Enzymes: No results for input(s): CKTOTAL, CKMB, CKMBINDEX, TROPONINI in the last 168 hours. BNP (last 3 results) No results for input(s): PROBNP in the last 8760 hours. HbA1C: No results for input(s): HGBA1C in the last 72 hours. CBG: Recent Labs  Lab 06/12/19 0951 06/12/19 1209 06/12/19 1408 06/12/19 1529 06/12/19 1954  GLUCAP 113* 100* 105* 93 162*   Lipid Profile: No results for input(s): CHOL, HDL, LDLCALC, TRIG, CHOLHDL, LDLDIRECT in the last 72 hours. Thyroid Function Tests: No results for input(s): TSH, T4TOTAL, FREET4, T3FREE, THYROIDAB in the last 72 hours. Anemia Panel: No results for input(s): VITAMINB12, FOLATE, FERRITIN, TIBC, IRON, RETICCTPCT in the last 72 hours. Sepsis Labs: No results for input(s): PROCALCITON, LATICACIDVEN in  the last 168 hours.  Recent Results (from the past 240 hour(s))  Respiratory Panel by RT PCR (Flu A&B, Covid) - Nasopharyngeal Swab     Status: None   Collection Time: 06/11/19 11:57 PM   Specimen: Nasopharyngeal Swab  Result Value Ref Range Status   SARS Coronavirus 2 by RT PCR NEGATIVE NEGATIVE Final    Comment: (NOTE) SARS-CoV-2 target nucleic acids are NOT DETECTED. The SARS-CoV-2 RNA is generally detectable in upper respiratoy specimens during the acute phase of infection. The lowest concentration of SARS-CoV-2 viral copies this assay can detect is 131 copies/mL. A negative result does not preclude SARS-Cov-2 infection and should not be used as the sole basis for treatment or other patient management decisions. A negative result may occur with  improper specimen collection/handling, submission of specimen other than nasopharyngeal swab, presence of viral mutation(s) within the areas targeted by this assay, and inadequate number of viral copies (<131 copies/mL). A negative result must be combined with clinical observations, patient history, and epidemiological information. The expected result is Negative. Fact Sheet for Patients:  PinkCheek.be Fact Sheet  for Healthcare Providers:  GravelBags.it This test is not yet ap proved or cleared by the [place] and  has been authorized for detection and/or diagnosis of SARS-CoV-2 by FDA under an Emergency Use Authorization (EUA). This EUA will remain  in effect (meaning this test can be used) for the duration of the COVID-19 declaration under Section 564(b)(1) of the Act, 21 U.S.C. section 360bbb-3(b)(1), unless the authorization is terminated or revoked sooner.    Influenza A by PCR NEGATIVE NEGATIVE Final   Influenza B by PCR NEGATIVE NEGATIVE Final    Comment: (NOTE) The Xpert Xpress SARS-CoV-2/FLU/RSV assay is intended as an aid in  the diagnosis of influenza from  Nasopharyngeal swab specimens and  should not be used as a sole basis for treatment. Nasal washings and  aspirates are unacceptable for Xpert Xpress SARS-CoV-2/FLU/RSV  testing. Fact Sheet for Patients: PinkCheek.be Fact Sheet for Healthcare Providers: GravelBags.it This test is not yet approved or cleared by the [place] FDA and  has been authorized for detection and/or diagnosis of SARS-CoV-2 by  FDA under an Emergency Use Authorization (EUA). This EUA will remain  in effect (meaning this test can be used) for the duration of the  Covid-19 declaration under Section 564(b)(1) of the Act, 21  U.S.C. section 360bbb-3(b)(1), unless the authorization is  terminated or revoked. Performed at [facility], [address] [place], [place] [address]   MRSA PCR Screening     Status: None   Collection Time: 06/12/19  3:30 PM   Specimen: Nasal Mucosa; Nasopharyngeal  Result Value Ref Range Status   MRSA by PCR NEGATIVE NEGATIVE Final    Comment:        The GeneXpert MRSA Assay (FDA approved for NASAL specimens only), is one component of a comprehensive MRSA colonization surveillance program. It is not intended to diagnose MRSA infection nor to guide or monitor treatment for MRSA infections. Performed at [facility], [address] [place], [place] [address]      Radiology Studies: [facility] Chest [place] 1 View  Result Date: 06/11/2019 CLINICAL DATA:  Weakness. EXAM: PORTABLE CHEST 1 VIEW COMPARISON:  March 31, 2018 FINDINGS: The heart size is stable. Aortic calcifications are noted. There is a large hiatal hernia. There is no pneumothorax. No significant pleural effusion. There is some atelectasis at the lung bases. There is no acute osseous abnormality. IMPRESSION: No acute cardiopulmonary disease. Large hiatal hernia. Electronically Signed   By: Constance Holster M.D.   On: 06/11/2019 23:52   CT Renal Stone  Study  Result Date: 06/12/2019 CLINICAL DATA:  Weakness. EXAM: CT ABDOMEN AND PELVIS WITHOUT CONTRAST TECHNIQUE: Multidetector CT imaging of the abdomen and pelvis was performed following the standard protocol without IV contrast. COMPARISON:  January 21, 2015 FINDINGS: Lower chest: Mild atelectasis is seen within the right lung base. Hepatobiliary: A diminutive left lobe of the liver is noted. Stable 3.0 cm x 2.5 cm, 6.1 cm x 4.5 cm and 3.7 cm x 2.6 cm cysts are seen within the anterior aspect of the right lobe of the liver. An additional 3.9 cm x 2.9 cm area of mildly increased attenuation is noted within the anteromedial aspect of the right lobe. This is seen on the prior study and is decreased in size on the current exam. There is a mild amount of perihepatic fluid. The gallbladder is poorly visualized and may be contracted. Pancreas: Pancreas is normal in appearance without evidence of pancreatic ductal dilatation. Spleen: A mild amount  of perisplenic fluid is seen along the anterior and lateral aspects of an otherwise normal-appearing spleen. Adrenals/Urinary Tract: Adrenal glands are unremarkable. The kidneys are normal in size, without renal calculi or focal lesions. Moderate to marked severity bilateral hydronephrosis and hydroureter are seen. The urinary bladder is markedly distended. Multiple large urinary bladder diverticula are seen. These are increased in size when compared to the prior study. The largest measures approximately 10.0 cm x 9.2 cm and is located along the anterolateral aspect of the urinary bladder on the right. Stomach/Bowel: A very large gastric hernia is seen. This is present on the prior study. The appendix is not clearly identified. No evidence of bowel dilatation. Numerous noninflamed diverticula are seen throughout the large bowel. A marked amount of inflammatory fat stranding is seen within the abdomen on the right. This surrounds the proximal portion of the duodenum and extends  inferiorly within the right abdomen along the anterior and posterior pararenal spaces. Vascular/Lymphatic: There is marked severity aortic calcification. No enlarged abdominal or pelvic lymph nodes. Reproductive: Status post hysterectomy. No adnexal masses. Other: A mild-to-moderate amount of fluid is seen within the lateral aspect of the abdomen on the right. This extends inferiorly into the right hemipelvis. A small amount of posterior pelvic free fluid is noted. Musculoskeletal: Multilevel degenerative changes seen throughout the lumbar spine. IMPRESSION: 1. Marked severity inflammatory process within the right abdomen within extensive amount of associated inflammatory fat stranding, as described above. Given the pattern of inflammatory stranding, this is likely consistent with marked severity duodenitis, however, correlation with a contrast-enhanced CT evaluation of the abdomen and pelvis is recommended. 2. Colonic diverticulosis. 3. Moderate to marked severity bilateral hydronephrosis and hydroureter 4. Mild-to-moderate amount of fluid is seen within the lateral aspect of the abdomen on the right. This extends inferiorly into the right hemipelvis. 5. Large gastric hernia. 6. Stable hepatic cysts. 7. Stable 3.9 cm x 2.9 cm area of mildly increased attenuation within the anteromedial aspect of the right lobe of the liver which may represent a previously collapsed hepatic cyst. 8. Multiple large urinary bladder diverticula, increased in size when compared to the prior study. Aortic Atherosclerosis (ICD10-I70.0). Electronically Signed   By: Virgina Norfolk M.D.   On: 06/12/2019 03:02    Scheduled Meds: . Chlorhexidine Gluconate Cloth  6 each Topical Daily  . heparin  5,000 Units Subcutaneous Q8H  . mouth rinse  15 mL Mouth Rinse BID   Continuous Infusions: . lactated ringers 100 mL/hr at 06/13/19 1559     LOS: 1 day   Time spent: 40 minutes.  Lorella Nimrod, MD [facility]  If 7PM-7AM,  please contact night-coverage [email or web address]  06/13/2019, 4:33 PM   This record has been created using Systems analyst. Errors have been sought and corrected,but may not always be located. Such creation errors do not reflect on the standard of care.

## 2019-06-13 NOTE — Progress Notes (Addendum)
CSW consulted due to patient possibly needing catheter supplies at discharge. If patient is discharged with catheter, she will likely need Urology follow up or Home Health RN services.  Spoke to MD. MD ordering PT/OT consults. Per Urology, patient will have catheter for 2 weeks post discharge and will need urology follow up. CSW will continue to follow for needs.  Oleh Genin, [place]

## 2019-06-13 NOTE — Progress Notes (Signed)
[facility]  ROUNDING NOTE   Subjective:   Patient is awake and talkative this morning.   Underwent cystoscopy by urology yesterday. UOP 5950  Creatinine 5.29 (10.35)  Objective:  Vital signs in last 24 hours:  Temp:  [97.6 F (36.4 C)-99.2 F (37.3 C)] 98.3 F (36.8 C) (04/07 0400) Pulse Rate:  [69-97] 69 (04/07 0900) Resp:  [10-24] 11 (04/07 0900) BP: (101-183)/(68-141) 137/87 (04/07 0900) SpO2:  [94 %-100 %] 100 % (04/07 0900) Weight:  [60.4 kg] 60.4 kg (04/06 1533)  Weight change: 4.608 kg Filed Weights   06/11/19 1850 06/12/19 1533  Weight: 55.8 kg 60.4 kg    Intake/Output: I/O last 3 completed shifts: In: 994.2 [I.V.:994.2] Out: [id number] [Urine:5950; Blood:5]   Intake/Output this shift:  No intake/output data recorded.  Physical Exam: General: NAD,   Head: Normocephalic, atraumatic. Moist oral mucosal membranes  Eyes: Anicteric, PERRL  Neck: Supple, trachea midline  Lungs:  Clear to auscultation  Heart: Regular rate and rhythm  Abdomen:  Soft, nontender,   Extremities: no peripheral edema.  Neurologic: Nonfocal, moving all four extremities  Skin: No lesions    Basic Metabolic Panel: Recent Labs  Lab 06/11/19 1909 06/11/19 1909 06/11/19 2357 06/12/19 0534 06/13/19 0404  NA 139  --  140 145 147*  K 7.2*  --  >7.5* 4.5 3.6  CL 104  --  104 114* 107  CO2 16*  --  17* 17* 29  GLUCOSE 89  --  87 35* 168*  BUN 98*  --  97* 82* 54*  CREATININE 13.01*  --  12.98* 10.35* 5.29*  CALCIUM 8.8*   < > 8.9 7.7* 8.2*  PHOS  --   --   --   --  4.9*   < > = values in this interval not displayed.    Liver Function Tests: Recent Labs  Lab 06/11/19 2357 06/13/19 0404  AST 15  --   ALT 12  --   ALKPHOS 50  --   BILITOT 1.0  --   PROT 7.5  --   ALBUMIN 3.9 3.2*   No results for input(s): LIPASE, AMYLASE in the last 168 hours. No results for input(s): AMMONIA in the last 168 hours.  CBC: Recent Labs  Lab 06/11/19 1909 06/12/19 0534  06/13/19 0404  WBC 8.9 9.4 3.7*  HGB 10.0* 8.9* 8.4*  HCT 32.5* 29.0* 27.0*  MCV 86.0 85.5 84.4  PLT 265 255 225    Cardiac Enzymes: No results for input(s): CKTOTAL, CKMB, CKMBINDEX, TROPONINI in the last 168 hours.  BNP: Invalid input(s): POCBNP  CBG: Recent Labs  Lab 06/12/19 0951 06/12/19 1209 06/12/19 1408 06/12/19 1529 06/12/19 1954  GLUCAP 113* 100* 105* 93 162*    Microbiology: Results for orders placed or performed during the hospital encounter of 06/11/19  Respiratory Panel by RT PCR (Flu A&B, Covid) - Nasopharyngeal Swab     Status: None   Collection Time: 06/11/19 11:57 PM   Specimen: Nasopharyngeal Swab  Result Value Ref Range Status   SARS Coronavirus 2 by RT PCR NEGATIVE NEGATIVE Final    Comment: (NOTE) SARS-CoV-2 target nucleic acids are NOT DETECTED. The SARS-CoV-2 RNA is generally detectable in upper respiratoy specimens during the acute phase of infection. The lowest concentration of SARS-CoV-2 viral copies this assay can detect is 131 copies/mL. A negative result does not preclude SARS-Cov-2 infection and should not be used as the sole basis for treatment or other patient management decisions. A negative result may  occur with  improper specimen collection/handling, submission of specimen other than nasopharyngeal swab, presence of viral mutation(s) within the areas targeted by this assay, and inadequate number of viral copies (<131 copies/mL). A negative result must be combined with clinical observations, patient history, and epidemiological information. The expected result is Negative. Fact Sheet for Patients:  PinkCheek.be Fact Sheet for Healthcare Providers:  GravelBags.it This test is not yet ap proved or cleared by the [place] FDA and  has been authorized for detection and/or diagnosis of SARS-CoV-2 by FDA under an Emergency Use Authorization (EUA). This EUA will remain  in  effect (meaning this test can be used) for the duration of the COVID-19 declaration under Section 564(b)(1) of the Act, 21 U.S.C. section 360bbb-3(b)(1), unless the authorization is terminated or revoked sooner.    Influenza A by PCR NEGATIVE NEGATIVE Final   Influenza B by PCR NEGATIVE NEGATIVE Final    Comment: (NOTE) The Xpert Xpress SARS-CoV-2/FLU/RSV assay is intended as an aid in  the diagnosis of influenza from Nasopharyngeal swab specimens and  should not be used as a sole basis for treatment. Nasal washings and  aspirates are unacceptable for Xpert Xpress SARS-CoV-2/FLU/RSV  testing. Fact Sheet for Patients: PinkCheek.be Fact Sheet for Healthcare Providers: GravelBags.it This test is not yet approved or cleared by the [place] FDA and  has been authorized for detection and/or diagnosis of SARS-CoV-2 by  FDA under an Emergency Use Authorization (EUA). This EUA will remain  in effect (meaning this test can be used) for the duration of the  Covid-19 declaration under Section 564(b)(1) of the Act, 21  U.S.C. section 360bbb-3(b)(1), unless the authorization is  terminated or revoked. Performed at [facility], [address] [place], [place] [address]   MRSA PCR Screening     Status: None   Collection Time: 06/12/19  3:30 PM   Specimen: Nasal Mucosa; Nasopharyngeal  Result Value Ref Range Status   MRSA by PCR NEGATIVE NEGATIVE Final    Comment:        The GeneXpert MRSA Assay (FDA approved for NASAL specimens only), is one component of a comprehensive MRSA colonization surveillance program. It is not intended to diagnose MRSA infection nor to guide or monitor treatment for MRSA infections. Performed at [facility], [address] [place], [place] [address]     Coagulation Studies: No results for input(s): LABPROT, INR in the last 72 hours.  Urinalysis: No results for input(s):  COLORURINE, LABSPEC, PHURINE, GLUCOSEU, HGBUR, BILIRUBINUR, KETONESUR, PROTEINUR, UROBILINOGEN, NITRITE, LEUKOCYTESUR in the last 72 hours.  Invalid input(s): APPERANCEUR    Imaging: DG Chest Port 1 View  Result Date: 06/11/2019 CLINICAL DATA:  Weakness. EXAM: PORTABLE CHEST 1 VIEW COMPARISON:  March 31, 2018 FINDINGS: The heart size is stable. Aortic calcifications are noted. There is a large hiatal hernia. There is no pneumothorax. No significant pleural effusion. There is some atelectasis at the lung bases. There is no acute osseous abnormality. IMPRESSION: No acute cardiopulmonary disease. Large hiatal hernia. Electronically Signed   By: Constance Holster M.D.   On: 06/11/2019 23:52   CT Renal Stone Study  Result Date: 06/12/2019 CLINICAL DATA:  Weakness. EXAM: CT ABDOMEN AND PELVIS WITHOUT CONTRAST TECHNIQUE: Multidetector CT imaging of the abdomen and pelvis was performed following the standard protocol without IV contrast. COMPARISON:  January 21, 2015 FINDINGS: Lower chest: Mild atelectasis is seen within the right lung base. Hepatobiliary: A diminutive left lobe of the liver is noted. Stable 3.0 cm  x 2.5 cm, 6.1 cm x 4.5 cm and 3.7 cm x 2.6 cm cysts are seen within the anterior aspect of the right lobe of the liver. An additional 3.9 cm x 2.9 cm area of mildly increased attenuation is noted within the anteromedial aspect of the right lobe. This is seen on the prior study and is decreased in size on the current exam. There is a mild amount of perihepatic fluid. The gallbladder is poorly visualized and may be contracted. Pancreas: Pancreas is normal in appearance without evidence of pancreatic ductal dilatation. Spleen: A mild amount of perisplenic fluid is seen along the anterior and lateral aspects of an otherwise normal-appearing spleen. Adrenals/Urinary Tract: Adrenal glands are unremarkable. The kidneys are normal in size, without renal calculi or focal lesions. Moderate to marked severity  bilateral hydronephrosis and hydroureter are seen. The urinary bladder is markedly distended. Multiple large urinary bladder diverticula are seen. These are increased in size when compared to the prior study. The largest measures approximately 10.0 cm x 9.2 cm and is located along the anterolateral aspect of the urinary bladder on the right. Stomach/Bowel: A very large gastric hernia is seen. This is present on the prior study. The appendix is not clearly identified. No evidence of bowel dilatation. Numerous noninflamed diverticula are seen throughout the large bowel. A marked amount of inflammatory fat stranding is seen within the abdomen on the right. This surrounds the proximal portion of the duodenum and extends inferiorly within the right abdomen along the anterior and posterior pararenal spaces. Vascular/Lymphatic: There is marked severity aortic calcification. No enlarged abdominal or pelvic lymph nodes. Reproductive: Status post hysterectomy. No adnexal masses. Other: A mild-to-moderate amount of fluid is seen within the lateral aspect of the abdomen on the right. This extends inferiorly into the right hemipelvis. A small amount of posterior pelvic free fluid is noted. Musculoskeletal: Multilevel degenerative changes seen throughout the lumbar spine. IMPRESSION: 1. Marked severity inflammatory process within the right abdomen within extensive amount of associated inflammatory fat stranding, as described above. Given the pattern of inflammatory stranding, this is likely consistent with marked severity duodenitis, however, correlation with a contrast-enhanced CT evaluation of the abdomen and pelvis is recommended. 2. Colonic diverticulosis. 3. Moderate to marked severity bilateral hydronephrosis and hydroureter 4. Mild-to-moderate amount of fluid is seen within the lateral aspect of the abdomen on the right. This extends inferiorly into the right hemipelvis. 5. Large gastric hernia. 6. Stable hepatic cysts. 7.  Stable 3.9 cm x 2.9 cm area of mildly increased attenuation within the anteromedial aspect of the right lobe of the liver which may represent a previously collapsed hepatic cyst. 8. Multiple large urinary bladder diverticula, increased in size when compared to the prior study. Aortic Atherosclerosis (ICD10-I70.0). Electronically Signed   By: Virgina Norfolk M.D.   On: 06/12/2019 03:02     Medications:   . lactated ringers with kcl     . Chlorhexidine Gluconate Cloth  6 each Topical Daily  . heparin  5,000 Units Subcutaneous Q8H  . mouth rinse  15 mL Mouth Rinse BID   labetalol, ondansetron **OR** ondansetron (ZOFRAN) IV  Assessment/ Plan:  [name] is a 79 y.o. black female  with hypertension, coronary artery disease, GERD, breast cancer, who was admitted to [facility] on 06/11/2019 for Hyperkalemia [E87.5] Generalized weakness [R53.1] AKI (acute kidney injury) ([place]) [N17.9] Acute renal failure, unspecified acute renal failure type ([facility]) [N17.9]  1. Acute renal failure 2. Hyperkalemia 3. Metabolic acidosis 4. Obstructive  uropathy 5. Anemia with kidney failure 6. Hypertensive urgency  Status post cystoscopy with urethral dilation on 4/6 by Dr. Bernardo Heater. Renal function is improving.  Hyperkalemia and metabolic acidosis has improved.  - Discontinue veltassa - Change IV fluids to Lactated Ringers.  - Holding lisinopril and meloxicam.  - IV labetalol PRN for blood pressure elevations   LOS: 1 Braylen Denunzio 4/7/20219:30 AM

## 2019-06-13 NOTE — Progress Notes (Deleted)
MEWS Guidelines - (patients age 81 and over) ° ° °

## 2019-06-13 NOTE — Progress Notes (Signed)
Report called to [facility] on 1C, pt agreeable to transfer, VSS on room air, Dr Reesa Chew paged for change to labetalol order, pt's diastolic pressure consistently around 100.  PT and OT ordered for patient per Dr Latina Craver note.  She will transfer with off unit monitor.  K will be taken out of fluids per [facility]

## 2019-06-14 ENCOUNTER — Telehealth: Payer: Self-pay | Admitting: Physician Assistant

## 2019-06-14 LAB — RENAL FUNCTION PANEL
Albumin: 2.8 g/dL — ABNORMAL LOW (ref 3.5–5.0)
Anion gap: 9 (ref 5–15)
BUN: 25 mg/dL — ABNORMAL HIGH (ref 8–23)
CO2: 29 mmol/L (ref 22–32)
Calcium: 8.3 mg/dL — ABNORMAL LOW (ref 8.9–10.3)
Chloride: 106 mmol/L (ref 98–111)
Creatinine, Ser: 1.87 mg/dL — ABNORMAL HIGH (ref 0.44–1.00)
GFR calc Af Amer: 29 mL/min — ABNORMAL LOW (ref >60)
GFR calc non Af Amer: 25 mL/min — ABNORMAL LOW (ref >60)
Glucose, Bld: 90 mg/dL (ref 70–99)
Phosphorus: 2.3 mg/dL — ABNORMAL LOW (ref 2.5–4.6)
Potassium: 3.7 mmol/L (ref 3.5–5.1)
Sodium: 144 mmol/L (ref 135–145)

## 2019-06-14 LAB — IRON AND TIBC
Iron: 36 ug/dL (ref 28–170)
Saturation Ratios: 14 % (ref 10.4–31.8)
TIBC: 253 ug/dL (ref 250–450)
UIBC: 217 ug/dL

## 2019-06-14 LAB — URINE CULTURE: Culture: NO GROWTH

## 2019-06-14 LAB — CBC
HCT: 28.2 % — ABNORMAL LOW (ref 36.0–46.0)
Hemoglobin: 8.6 g/dL — ABNORMAL LOW (ref 12.0–15.0)
MCH: 26.7 pg (ref 26.0–34.0)
MCHC: 30.5 g/dL (ref 30.0–36.0)
MCV: 87.6 fL (ref 80.0–100.0)
Platelets: 256 10*3/uL (ref 150–400)
RBC: 3.22 MIL/uL — ABNORMAL LOW (ref 3.87–5.11)
RDW: 20.2 % — ABNORMAL HIGH (ref 11.5–15.5)
WBC: 7 10*3/uL (ref 4.0–10.5)
nRBC: 0 % (ref 0.0–0.2)

## 2019-06-14 LAB — FERRITIN: Ferritin: 40 ng/mL (ref 11–307)

## 2019-06-14 LAB — VITAMIN B12: Vitamin B-12: 235 pg/mL (ref 180–914)

## 2019-06-14 LAB — FOLATE: Folate: 6.4 ng/mL (ref >5.9)

## 2019-06-14 LAB — RETICULOCYTES
Immature Retic Fract: 27.9 % — ABNORMAL HIGH (ref 2.3–15.9)
RBC.: 3.21 MIL/uL — ABNORMAL LOW (ref 3.87–5.11)
Retic Count, Absolute: 39.8 10*3/uL (ref 19.0–186.0)
Retic Ct Pct: 1.2 % (ref 0.4–3.1)

## 2019-06-14 MED ORDER — AMLODIPINE BESYLATE 5 MG PO TABS
5.0000 mg | ORAL_TABLET | Freq: Every day | ORAL | Status: DC
  Administered 2019-06-14: 5 mg via ORAL
  Filled 2019-06-14: qty 1

## 2019-06-14 NOTE — Progress Notes (Signed)
Inpatient Rehabilitation-Admissions Coordinator   Spoke with pt on the phone for CIR assessment. Briefly discussed CIR details and expectations including therapy intensity, anticipated LOS, and expected functional outcomes. Pt currently not sure if she will have assistance at DC from family and would like to speak with her husband this afternoon and follow up with me tomorrow with a decision regarding rehab. Unable to open case today due to pt indecision at this time.   Will follow up tomorrow for pt preference.   Raechel Ache, OTR/L  Rehab Admissions Coordinator  [phone] 06/14/2019 3:40 PM

## 2019-06-14 NOTE — Progress Notes (Signed)
Urology Inpatient Progress Note  Subjective: [name] [name] is a 79 y.o. 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 Bernardo Heater.  Creatinine continues to downtrend, today 1.87.  Urine culture resulted negative.  Foley catheter in place draining clear, yellow urine.  Today, patient reports continuing to feel better each day.  She has had minimal discomfort associated with her Foley catheter.  No acute concerns..   Anti-infectives: Anti-infectives (From admission, onward)   None      Current Facility-Administered Medications  Medication Dose Route Frequency Provider Last Rate Last Admin  . acetaminophen (TYLENOL) tablet 1,000 mg  1,000 mg Oral Q8H PRN Sharion Settler, NP   1,000 mg at 06/13/19 2142  . amLODipine (NORVASC) tablet 5 mg  5 mg Oral Daily Lorella Nimrod, MD   5 mg at 06/14/19 1322  . bisacodyl (DULCOLAX) EC tablet 5 mg  5 mg Oral Daily PRN Sharion Settler, NP      . Chlorhexidine Gluconate Cloth 2 % PADS 6 each  6 each Topical Daily Stoioff, Ronda Fairly, MD   6 each at 06/14/19 0950  . heparin injection 5,000 Units  5,000 Units Subcutaneous Q8H Stoioff, Scott C, MD   5,000 Units at 06/14/19 1322  . labetalol (NORMODYNE) injection 10 mg  10 mg Intravenous Q2H PRN Lorella Nimrod, MD   10 mg at 06/14/19 0639  . lactated ringers infusion   Intravenous Continuous Lorella Nimrod, MD 100 mL/hr at 06/14/19 1328 Rate Verify at 06/14/19 1328  . magnesium hydroxide (MILK OF MAGNESIA) suspension 30 mL  30 mL Oral Daily PRN Sharion Settler, NP      . MEDLINE mouth rinse  15 mL Mouth Rinse BID Stoioff, Scott C, MD   15 mL at 06/14/19 0950  . ondansetron (ZOFRAN) tablet 4 mg  4 mg Oral Q6H PRN Stoioff, Scott C, MD       Or  . ondansetron (ZOFRAN) injection 4 mg  4 mg Intravenous Q6H PRN Stoioff, Scott C,  MD   4 mg at 06/12/19 1329  . sodium phosphate (FLEET) 7-19 GM/118ML enema 1 enema  1 enema Rectal Once PRN Sharion Settler, NP       Objective: Vital signs in last 24 hours: Temp:  [97.7 F (36.5 C)-98.3 F (36.8 C)] 97.9 F (36.6 C) (04/08 1647) Pulse Rate:  [73-114] 75 (04/08 1647) Resp:  [14-20] 17 (04/08 1647) BP: (133-202)/(76-92) 202/92 (04/08 1647) SpO2:  [94 %-100 %] 100 % (04/08 1647)  Intake/Output from previous day: 04/07 0701 - 04/08 0700 In: 1829.7 [I.V.:1829.7] Out: 4450 [Urine:4450] Intake/Output this shift: Total I/O In: 915.4 [P.O.:120; I.V.:795.4] Out: 1600 [Urine:1600]  Physical Exam Vitals and nursing note reviewed.  Constitutional:      General: She is not in acute distress.    Appearance: She is not ill-appearing, toxic-appearing or diaphoretic.  HENT:     Head: Normocephalic and atraumatic.  Pulmonary:     Effort: Pulmonary effort is normal. No respiratory distress.  Skin:    General: Skin is warm and dry.  Neurological:     Mental Status: She is alert and oriented to person, place, and time.  Psychiatric:        Mood and Affect: Mood normal.        Behavior: Behavior normal.    Lab Results:  Recent Labs  06/13/19 0404 06/14/19 0542  WBC 3.7* 7.0  HGB 8.4* 8.6*  HCT 27.0* 28.2*  PLT 225 256   BMET Recent Labs    06/13/19 0404 06/14/19 0542  NA 147* 144  K 3.6 3.7  CL 107 106  CO2 29 29  GLUCOSE 168* 90  BUN 54* 25*  CREATININE 5.29* 1.87*  CALCIUM 8.2* 8.3*   Assessment & Plan: 79 year old female with a history of urethral stricture and chronic retention now POD 2 from cystoscopy and urethral dilation with Dr. Bernardo Heater for management of acute on chronic urinary retention with acute renal failure.  Creatinine continues to downtrend today.  Urine culture ultimately negative.  Reiterated the postoperative plan with the patient and her husband at the bedside today.  Explained that she will keep her Foley catheter for 2 weeks  with plans for voiding trial in clinic in 14 days.  Will arrange follow-up.  Okay to discharge from urologic perspective.  Debroah Loop, PA-C 06/14/2019

## 2019-06-14 NOTE — Telephone Encounter (Signed)
Please schedule her for outpatient voiding trial in 12 days following cystoscopy and urethral dilation.

## 2019-06-14 NOTE — Evaluation (Signed)
Occupational Therapy Evaluation Patient Details Name: [name] [name] MRN: [id number] DOB: 1940/03/15 Today's Date: 06/14/2019    History of Present Illness 79 yo female with onset of SOB and weakness was admitted for evaluation.  Received foley, had AKI wiht metabolic acidosis, elevated K+, hypertensive urgency (resolved) and suspected bladder outlet obstruction.  Noted hydronephrosis, atelectasis, duodenitis, diverticulosis.  PMHx:  atherosclerosis, HTN, CAD, breast CA, MI,    Clinical Impression   Ms. Gossman was seen for OT evaluation this date. Pt received semi-supine in bed, just finishing up PT session. Pt A&O x4, denies pain, and agreeable to OT evaluation.  Prior to hospital admission, pt was generally independent with BADL management, however she reports she always asks her husband to be nearby with bathing because she is afraid of falling at home. She denies falls history in the past year, and uses a 2WW or SPC for functional mobility outside of the home. Pt lives her husband in a 1 level home with level entry. She reports her husband is able to provide her with 24/7 assistance and that she has two supportive sons also live nearby and assist her with IADL management including shopping, cleaning, and cooking.   Currently pt demonstrates impairments as described below (See OT problem list) which functionally limit her ability to perform ADL/self-care tasks. Pt currently requires moderate assistance for bed mobility and functional transfers, as well as moderate assistance with bathing and dressing tasks. She is eager to participate throughout the session and specifically asks this Chief Strategy Officer for written exercise programs from PT (PT informed) and OT (to be provided at next session) so, "I can get my strength back".  Pt would benefit from skilled OT services to address noted impairments and functional limitations (see below for any additional details) in order to maximize safety and independence  while minimizing falls risk and caregiver burden. Upon hospital discharge, recommend CIR for acute intensive rehabilitative services to maximize pt safety, return to PLOF, and improve/restore meaningful participation in occupations of daily life.      Follow Up Recommendations  CIR    Equipment Recommendations  3 in 1 bedside commode    Recommendations for Other Services       Precautions / Restrictions Precautions Precautions: Fall Precaution Comments: monitor for O2 sats Restrictions Weight Bearing Restrictions: No      Mobility Bed Mobility Overal bed mobility: Needs Assistance Bed Mobility: Sit to Supine;Supine to Sit     Supine to sit: Mod assist;HOB elevated Sit to supine: Mod assist;HOB elevated   General bed mobility comments: Max assist for boost up in bed.  Transfers Overall transfer level: Needs assistance   Transfers: Sit to/from Stand Sit to Stand: Mod assist;From elevated surface         General transfer comment: Pt requires mod A to come to standing. Is able to static stand at EOB during fxl task with close CGA for safety but requires moderate assist with attempts to side step at EOB.    Balance Overall balance assessment: Needs assistance Sitting-balance support: Feet supported;No upper extremity supported Sitting balance-Leahy Scale: Fair Sitting balance - Comments: generally stable static sitting, reaching within BOS. Able to weight shift with 1 UE support   Standing balance support: Bilateral upper extremity supported;During functional activity Standing balance-Leahy Scale: Fair Standing balance comment: Pt requires moderate assist to maintain standing balance during functional tasks as well as moderate assist to side-step at EOB.  ADL either performed or assessed with clinical judgement   ADL Overall ADL's : Needs assistance/impaired                                       General ADL  Comments: Pt is functionally limited by generalized weakness, decreased activity tolerance, and decreased safety awareness. She requires moderate assistance for bed mobility/transfers this date. Pt endorses significant fear of falling. She requires set-up/supervision as well as cueing for sequencing during UB grooming tasks while seated EOB, as well as moderate assist for UB/LB sponge bathing.     Vision Baseline Vision/History: Wears glasses Wears Glasses: At all times Patient Visual Report: No change from baseline Vision Assessment?: No apparent visual deficits     Perception     Praxis      Pertinent Vitals/Pain Pain Assessment: No/denies pain     Hand Dominance Right   Extremity/Trunk Assessment Upper Extremity Assessment Upper Extremity Assessment: Generalized weakness   Lower Extremity Assessment Lower Extremity Assessment: Generalized weakness       Communication Communication Communication: No difficulties   Cognition Arousal/Alertness: Awake/alert Behavior During Therapy: WFL for tasks assessed/performed Overall Cognitive Status: No family/caregiver present to determine baseline cognitive functioning                                 General Comments: Pt A&O to self, date, situation and place. Requires some increased cueing for safety during functional tasks with noted decreased motor planning/initiation of tasks. Requires VCs to follow multi-step commands. No family present to determine baseline.   General Comments  Pt HR/spO2 monitored t/o session. Remains WFLs.    Exercises Other Exercises Other Exercises: Pt educated on falls prevention strategies, safe use of AE/DME for LB ADL management, and role of OT in acute care setting. Other Exercises: OT engages pt in fxl activities as described above. See ADL section for additional details.   Shoulder Instructions      Home Living Family/patient expects to be discharged to:: Private residence Living  Arrangements: Spouse/significant other Available Help at Discharge: Family;Available 24 hours/day Type of Home: House Home Access: Level entry     Home Layout: One level         Bathroom Toilet: Handicapped height     Home Equipment: Walker - 2 wheels;Cane - single point;Hand held shower head          Prior Functioning/Environment Level of Independence: Needs assistance  Gait / Transfers Assistance Needed: Pt reports using her cane/walker for community mobility. In the home she will hold onto her husband, or walks short distances independently. ADL's / Homemaking Assistance Needed: Pt states that her husband provides supervision for safety during her BADL tasks, and particularly bathing. Her husband and sons assist with all IADL management. She endorses significant fear of falling, but denies falls history in the past 12 months.            OT Problem List: Decreased strength;Decreased coordination;Decreased activity tolerance;Decreased safety awareness;Impaired balance (sitting and/or standing);Decreased knowledge of use of DME or AE;Decreased cognition;Cardiopulmonary status limiting activity      OT Treatment/Interventions: Self-care/ADL training;Therapeutic exercise;Therapeutic activities;DME and/or AE instruction;Patient/family education;Energy conservation;Balance training    OT Goals(Current goals can be found in the care plan section) Acute Rehab OT Goals Patient Stated Goal: To get stronger and not have any falls. OT Goal  Formulation: With patient Time For Goal Achievement: 06/28/19 Potential to Achieve Goals: Good ADL Goals Pt Will Perform Grooming: sitting;with set-up;with supervision(With LRAD PRN for improved safety and fxl independence.) Pt Will Perform Upper Body Dressing: sitting;with supervision;with set-up;with caregiver independent in assisting(With LRAD PRN for improved safety and fxl independence.) Pt Will Perform Lower Body Dressing: sit to/from  stand;with min guard assist;with caregiver independent in assisting(With LRAD PRN for improved safety and fxl independence.) Additional ADL Goal #1: Pt will independently verbalize a plan to implement at least 3 learned falls prevention strategies for improved safety and functional independence upon hospital DC.  OT Frequency: Min 3X/week   Barriers to D/C: Inaccessible home environment          Co-evaluation              AM-PAC OT "6 Clicks" Daily Activity     Outcome Measure Help from another person eating meals?: A Little Help from another person taking care of personal grooming?: A Little Help from another person toileting, which includes using toliet, bedpan, or urinal?: A Lot Help from another person bathing (including washing, rinsing, drying)?: A Lot Help from another person to put on and taking off regular upper body clothing?: A Little Help from another person to put on and taking off regular lower body clothing?: A Lot 6 Click Score: 15   End of Session Equipment Utilized During Treatment: Gait belt Nurse Communication: Other (comment)(Pt completed seated sponge bath and is back to bed with clean gown, lunch tray.)  Activity Tolerance: Patient tolerated treatment well Patient left: in bed;with call bell/phone within reach;with bed alarm set;with SCD's reapplied  OT Visit Diagnosis: Other abnormalities of gait and mobility (R26.89);Muscle weakness (generalized) (M62.81)                Time: [id number] OT Time Calculation (min): 58 min Charges:  OT General Charges $OT Visit: 1 Visit OT Evaluation $OT Eval Moderate Complexity: 1 Mod OT Treatments $Self Care/Home Management : 38-52 mins  Shara Blazing, M.S., OTR/L Ascom: [phone] 06/14/19, 12:26 PM

## 2019-06-14 NOTE — Progress Notes (Signed)
Rehab Admissions Coordinator Note:  Patient was screened by Cleatrice Burke for appropriateness for an Inpatient Acute Rehab admit at [facility] per OT recommendations.  At this time, we are recommending Inpatient Rehab consult. I will place order per protocol.   Cleatrice Burke RN MSN 06/14/2019, 1:04 PM  I can be reached at [phone].

## 2019-06-14 NOTE — Evaluation (Signed)
Physical Therapy Evaluation Patient Details Name: [name] [name] MRN: [id number] DOB: 11-25-1940 Today's Date: 06/14/2019   History of Present Illness  79 yo female with onset of SOB and weakness was admitted for evaluation.  Received foley, had AKI wiht metabolic acidosis, elevated K+, hypertensive urgency (resolved) and suspected bladder outlet obstruction.  Noted hydronephrosis, atelectasis, duodenitis, diverticulosis.  PMHx:  atherosclerosis, HTN, CAD, breast CA, MI,   Clinical Impression  Pt was seen for initiation of mobility and noted her struggle to take steps on RW on side of bed.  Her control of dynamic balance requires repetitive instruction, and will need to be retrained due to mod assist and limited awareness of sequence to move off bed and stand, step and reposition herself.  Follow up with her acutely and recommending rehab to get stronger and get home.    Follow Up Recommendations CIR    Equipment Recommendations  None recommended by PT    Recommendations for Other Services Rehab consult     Precautions / Restrictions Precautions Precautions: Fall Precaution Comments: monitor for O2 sats Restrictions Weight Bearing Restrictions: No      Mobility  Bed Mobility Overal bed mobility: Needs Assistance Bed Mobility: Supine to Sit;Sit to Supine     Supine to sit: Mod assist;HOB elevated Sit to supine: Mod assist;HOB elevated   General bed mobility comments: scooting up dependent with gravity assist  Transfers Overall transfer level: Needs assistance Equipment used: Rolling walker (2 wheeled);1 person hand held assist Transfers: Sit to/from Stand Sit to Stand: Mod assist;From elevated surface         General transfer comment: mod to power up and min to maintain dynamic balance on RW  Ambulation/Gait             General Gait Details: sidesteps on side of bed in transfer to set up  Stairs            Wheelchair Mobility    Modified Rankin  (Stroke Patients Only)       Balance Overall balance assessment: Needs assistance Sitting-balance support: Feet supported Sitting balance-Leahy Scale: Fair Sitting balance - Comments: generally stable static sitting, reaching within BOS. Able to weight shift with 1 UE support   Standing balance support: Bilateral upper extremity supported;During functional activity Standing balance-Leahy Scale: Fair Standing balance comment: less than fair dynamically                             Pertinent Vitals/Pain Pain Assessment: No/denies pain    Home Living Family/patient expects to be discharged to:: Private residence Living Arrangements: Spouse/significant other Available Help at Discharge: Family;Available 24 hours/day Type of Home: House Home Access: Level entry     Home Layout: One level Home Equipment: Walker - 2 wheels;Cane - single point;Hand held shower head Additional Comments: pt is much less functional than PLOF    Prior Function Level of Independence: Needs assistance   Gait / Transfers Assistance Needed: cane in community, RW at home  ADL's / Homemaking Assistance Needed: no falls, husband assisting her with bathing and dressing        Hand Dominance   Dominant Hand: Right    Extremity/Trunk Assessment   Upper Extremity Assessment Upper Extremity Assessment: Defer to OT evaluation    Lower Extremity Assessment Lower Extremity Assessment: Generalized weakness    Cervical / Trunk Assessment Cervical / Trunk Assessment: Normal  Communication   Communication: No difficulties  Cognition Arousal/Alertness: Awake/alert  Behavior During Therapy: WFL for tasks assessed/performed Overall Cognitive Status: No family/caregiver present to determine baseline cognitive functioning                                 General Comments: repetitive cues for motor planning and follow through      General Comments General comments (skin integrity,  edema, etc.): no significant drops in O2 sats    Exercises Other Exercises Other Exercises: Pt educated on falls prevention strategies, safe use of AE/DME for LB ADL management, and role of OT in acute care setting. Other Exercises: OT engages pt in fxl activities as described above. See ADL section for additional details.   Assessment/Plan    PT Assessment Patient needs continued PT services  PT Problem List Decreased strength;Decreased range of motion;Decreased activity tolerance;Decreased balance;Decreased mobility;Decreased coordination;Decreased knowledge of use of DME;Decreased safety awareness;Cardiopulmonary status limiting activity       PT Treatment Interventions DME instruction;Gait training;Functional mobility training;Therapeutic activities;Therapeutic exercise;Balance training;Neuromuscular re-education;Patient/family education    PT Goals (Current goals can be found in the Care Plan section)  Acute Rehab PT Goals Patient Stated Goal: get home and feel better PT Goal Formulation: With patient Time For Goal Achievement: 06/28/19 Potential to Achieve Goals: Good    Frequency Min 2X/week   Barriers to discharge   husband would need to assist all mobility    Co-evaluation               AM-PAC PT "6 Clicks" Mobility  Outcome Measure Help needed turning from your back to your side while in a flat bed without using bedrails?: A Little Help needed moving from lying on your back to sitting on the side of a flat bed without using bedrails?: A Lot Help needed moving to and from a bed to a chair (including a wheelchair)?: A Lot Help needed standing up from a chair using your arms (e.g., wheelchair or bedside chair)?: A Lot Help needed to walk in hospital room?: A Lot Help needed climbing 3-5 steps with a railing? : Total 6 Click Score: 12    End of Session Equipment Utilized During Treatment: Gait belt Activity Tolerance: Patient limited by fatigue;Treatment limited  secondary to medical complications (Comment) Patient left: in bed;with call bell/phone within reach;with bed alarm set Nurse Communication: Mobility status PT Visit Diagnosis: Unsteadiness on feet (R26.81);Muscle weakness (generalized) (M62.81);Ataxic gait (R26.0)    Time: [id number] PT Time Calculation (min) (ACUTE ONLY): 29 min   Charges:   PT Evaluation $PT Eval Moderate Complexity: 1 Mod PT Treatments $Therapeutic Activity: 8-22 mins       Ramond Dial 06/14/2019, 3:06 PM   Mee Hives, PT MS Acute Rehab Dept. Number: [place] [facility] [place]

## 2019-06-14 NOTE — Progress Notes (Signed)
[facility]  ROUNDING NOTE   Subjective:   Working with OT.   Objective:  Vital signs in last 24 hours:  Temp:  [97.7 F (36.5 C)-98.3 F (36.8 C)] 97.8 F (36.6 C) (04/08 0813) Pulse Rate:  [73-114] 73 (04/08 0813) Resp:  [12-20] 16 (04/08 0813) BP: (133-206)/(76-103) 168/82 (04/08 0813) SpO2:  [94 %-100 %] 98 % (04/08 0813)  Weight change:  Filed Weights   06/11/19 1850 06/12/19 1533  Weight: 55.8 kg 60.4 kg    Intake/Output: I/O last 3 completed shifts: In: 3019.5 [I.V.:3019.5] Out: [id number] [Urine:7350]   Intake/Output this shift:  No intake/output data recorded.  Physical Exam: General: NAD,   Head: Normocephalic, atraumatic. Moist oral mucosal membranes  Eyes: Anicteric, PERRL  Neck: Supple, trachea midline  Lungs:  Clear to auscultation  Heart: Regular rate and rhythm  Abdomen:  Soft, nontender,   Extremities: no peripheral edema.  Neurologic: Nonfocal, moving all four extremities  Skin: No lesions    Basic Metabolic Panel: Recent Labs  Lab 06/11/19 1909 06/11/19 1909 06/11/19 2357 06/11/19 2357 06/12/19 0534 06/13/19 0404 06/14/19 0542  NA 139  --  140  --  145 147* 144  K 7.2*  --  >7.5*  --  4.5 3.6 3.7  CL 104  --  104  --  114* 107 106  CO2 16*  --  17*  --  17* 29 29  GLUCOSE 89  --  87  --  35* 168* 90  BUN 98*  --  97*  --  82* 54* 25*  CREATININE 13.01*  --  12.98*  --  10.35* 5.29* 1.87*  CALCIUM 8.8*   < > 8.9   < > 7.7* 8.2* 8.3*  PHOS  --   --   --   --   --  4.9* 2.3*   < > = values in this interval not displayed.    Liver Function Tests: Recent Labs  Lab 06/11/19 2357 06/13/19 0404 06/14/19 0542  AST 15  --   --   ALT 12  --   --   ALKPHOS 50  --   --   BILITOT 1.0  --   --   PROT 7.5  --   --   ALBUMIN 3.9 3.2* 2.8*   No results for input(s): LIPASE, AMYLASE in the last 168 hours. No results for input(s): AMMONIA in the last 168 hours.  CBC: Recent Labs  Lab 06/11/19 1909 06/12/19 0534  06/13/19 0404 06/14/19 0542  WBC 8.9 9.4 3.7* 7.0  HGB 10.0* 8.9* 8.4* 8.6*  HCT 32.5* 29.0* 27.0* 28.2*  MCV 86.0 85.5 84.4 87.6  PLT 265 255 225 256    Cardiac Enzymes: No results for input(s): CKTOTAL, CKMB, CKMBINDEX, TROPONINI in the last 168 hours.  BNP: Invalid input(s): POCBNP  CBG: Recent Labs  Lab 06/12/19 0951 06/12/19 1209 06/12/19 1408 06/12/19 1529 06/12/19 1954  GLUCAP 113* 100* 105* 93 162*    Microbiology: Results for orders placed or performed during the hospital encounter of 06/11/19  Respiratory Panel by RT PCR (Flu A&B, Covid) - Nasopharyngeal Swab     Status: None   Collection Time: 06/11/19 11:57 PM   Specimen: Nasopharyngeal Swab  Result Value Ref Range Status   SARS Coronavirus 2 by RT PCR NEGATIVE NEGATIVE Final    Comment: (NOTE) SARS-CoV-2 target nucleic acids are NOT DETECTED. The SARS-CoV-2 RNA is generally detectable in upper respiratoy specimens during the acute phase of infection. The lowest  concentration of SARS-CoV-2 viral copies this assay can detect is 131 copies/mL. A negative result does not preclude SARS-Cov-2 infection and should not be used as the sole basis for treatment or other patient management decisions. A negative result may occur with  improper specimen collection/handling, submission of specimen other than nasopharyngeal swab, presence of viral mutation(s) within the areas targeted by this assay, and inadequate number of viral copies (<131 copies/mL). A negative result must be combined with clinical observations, patient history, and epidemiological information. The expected result is Negative. Fact Sheet for Patients:  PinkCheek.be Fact Sheet for Healthcare Providers:  GravelBags.it This test is not yet ap proved or cleared by the [place] FDA and  has been authorized for detection and/or diagnosis of SARS-CoV-2 by FDA under an Emergency Use  Authorization (EUA). This EUA will remain  in effect (meaning this test can be used) for the duration of the COVID-19 declaration under Section 564(b)(1) of the Act, 21 U.S.C. section 360bbb-3(b)(1), unless the authorization is terminated or revoked sooner.    Influenza A by PCR NEGATIVE NEGATIVE Final   Influenza B by PCR NEGATIVE NEGATIVE Final    Comment: (NOTE) The Xpert Xpress SARS-CoV-2/FLU/RSV assay is intended as an aid in  the diagnosis of influenza from Nasopharyngeal swab specimens and  should not be used as a sole basis for treatment. Nasal washings and  aspirates are unacceptable for Xpert Xpress SARS-CoV-2/FLU/RSV  testing. Fact Sheet for Patients: PinkCheek.be Fact Sheet for Healthcare Providers: GravelBags.it This test is not yet approved or cleared by the [place] FDA and  has been authorized for detection and/or diagnosis of SARS-CoV-2 by  FDA under an Emergency Use Authorization (EUA). This EUA will remain  in effect (meaning this test can be used) for the duration of the  Covid-19 declaration under Section 564(b)(1) of the Act, 21  U.S.C. section 360bbb-3(b)(1), unless the authorization is  terminated or revoked. Performed at [facility], [address], [place], [place] [address]   Urine Culture     Status: None   Collection Time: 06/12/19  1:49 PM   Specimen: PATH Cytology Urine  Result Value Ref Range Status   Specimen Description   Final    CYSTOSCOPY Performed at [facility], [address], [place], [place] [address]    Special Requests   Final    URINE CULTURE Performed at [facility], [address], [place], [place] [address]    Culture   Final    NO GROWTH Performed at [facility], [place] [address], [place], [place] [address]    Report Status 06/14/2019 FINAL  Final  MRSA PCR Screening     Status: None   Collection Time: 06/12/19  3:30 PM    Specimen: Nasal Mucosa; Nasopharyngeal  Result Value Ref Range Status   MRSA by PCR NEGATIVE NEGATIVE Final    Comment:        The GeneXpert MRSA Assay (FDA approved for NASAL specimens only), is one component of a comprehensive MRSA colonization surveillance program. It is not intended to diagnose MRSA infection nor to guide or monitor treatment for MRSA infections. Performed at [facility], [address] [place], [place] [address]     Coagulation Studies: No results for input(s): LABPROT, INR in the last 72 hours.  Urinalysis: No results for input(s): COLORURINE, LABSPEC, PHURINE, GLUCOSEU, HGBUR, BILIRUBINUR, KETONESUR, PROTEINUR, UROBILINOGEN, NITRITE, LEUKOCYTESUR in the last 72 hours.  Invalid input(s): APPERANCEUR    Imaging: No  results found.   Medications:   . lactated ringers 100 mL/hr at 06/14/19 0500   . Chlorhexidine Gluconate Cloth  6 each Topical Daily  . heparin  5,000 Units Subcutaneous Q8H  . mouth rinse  15 mL Mouth Rinse BID   acetaminophen, bisacodyl, labetalol, magnesium hydroxide, ondansetron **OR** ondansetron (ZOFRAN) IV, sodium phosphate  Assessment/ Plan:  [name] is a 79 y.o. black female  with hypertension, coronary artery disease, GERD, breast cancer, who was admitted to [facility] on 06/11/2019 for Hyperkalemia [E87.5] Generalized weakness [R53.1] AKI (acute kidney injury) ([place]) [N17.9] Acute renal failure, unspecified acute renal failure type ([place]) [N17.9]  1. Acute renal failure 2. Hyperkalemia 3. Metabolic acidosis 4. Obstructive uropathy 5. Anemia with kidney failure 6. Hypertensive urgency  Status post cystoscopy with urethral dilation on 4/6 by Dr. Bernardo Heater. Renal function is improving.  Hyperkalemia and metabolic acidosis has improved.  - IV fluids Lactated Ringers.  - Holding lisinopril and meloxicam.  - IV labetalol PRN for blood pressure elevations - Foley catheter as per urology   LOS: 2 Lindee Leason 4/8/202111:52 AM

## 2019-06-14 NOTE — Progress Notes (Signed)
   06/13/19 1548  Assess: MEWS Score  BP (!) 206/85  Pulse Rate 89  Resp 18  Level of Consciousness Alert  SpO2 98 %  O2 Device Room Air  Assess: MEWS Score  MEWS Temp 0  MEWS Systolic 2  MEWS Pulse 0  MEWS RR 0  MEWS LOC 0  MEWS Score 2  MEWS Score Color Yellow  Assess: if the MEWS score is Yellow or Red  Were vital signs taken at a resting state? Yes (but patient had just been tranferred from CCU)  Focused Assessment Documented focused assessment  Early Detection of Sepsis Score *See Row Information* Low  MEWS guidelines implemented *See Row Information* Yes  Treat  MEWS Interventions Administered prn meds/treatments (PRN BP medication see MAR)  Take Vital Signs  Increase Vital Sign Frequency  Yellow: Q 2hr X 2 then Q 4hr X 2, if remains yellow, continue Q 4hrs  Escalate  MEWS: Escalate Yellow: discuss with charge nurse/RN and consider discussing with provider and RRT  Notify: Charge Nurse/RN  Name of Charge Nurse/RN Notified  (I am charge today, and I spoke to [place], Therapist, sports about same.)

## 2019-06-14 NOTE — Progress Notes (Signed)
PROGRESS NOTE    [name] [name]  [id number] DOB: 1940-09-10 DOA: 06/11/2019 PCP: Jerrol Banana., MD   Brief Narrative:  [name] [name] is a 79 y.o. female with medical history significant for hypertension, CAD and breast cancer on letrozole who presents to the emergency room with a 1 day history of generalized weakness, inability to ambulate per her usual.  She denies associated numbness or tingling in the face or extremities the weakness is not her whole body, not one-sided.  She denies headache or blurred vision, denies chest pain, shortness of breath.  She denies GI or GU complaints.  She was found to have creatinine of 13 with baseline below 1.  Abdomen and pelvis with bilateral hydronephrosis and distended ureter and bladder and potassium of 7.5.  Urology was consulted, multiple failed attempts to place Foley.  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  Subjective: Patient was feeling much better when seen today.  No new complaints.  Assessment & Plan:   Active Problems:   CAD (coronary artery disease)   History of breast cancer   Acute renal failure (HCC)   Generalized weakness   Hyperkalemia   Hypertensive urgency  AKI with hyperkalemia and metabolic acidosis.  Hyperkalemia and metabolic acidosis resolved.  Most likely secondary to bladder outlet obstruction resulted in markedly distended bladder and ureters along with bilateral hydronephrosis.  Multiple failed attempts to place Foley by nursing staff and urology.  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  Urethra was dilated and a Foley was inserted which should remain in for 2 weeks and she will follow up with urology as an outpatient. Urine function improving with creatinine improved to 1.87 today. Urine cultures -negative. -Continue to monitor renal function. -Avoid  nephrotoxins. -Nephrology was consulted-appreciate their recommendations. -PT/OT is recommending CIR-pending evaluation by rehab physicians.  hypertensive urgency.  Resolved.  BP elevated today. -Blood pressure elevated  in the emergency room 212/101 -Hold home lisinopril due to AKI. -Start her on low-dose amlodipine-will titrate accordingly. -Labetalol IV to keep systolic under 0000000    CAD (coronary artery disease) -No complaints of chest pain no acute ST-T wave changes -Not currently on aspirin, beta-blocker or statin -   History of breast cancer -continue letrozole  Objective: Vitals:   06/13/19 1756 06/14/19 0228 06/14/19 0630 06/14/19 0813  BP: (!) 175/84 133/77 (!) 177/76 (!) 168/82  Pulse: 82 77 (!) 114 73  Resp: 18 20 14 16   Temp: 98.3 F (36.8 C) 97.7 F (36.5 C) 97.9 F (36.6 C) 97.8 F (36.6 C)  TempSrc: Oral Oral  Oral  SpO2: 98% 94% 97% 98%  Weight:      Height:        Intake/Output Summary (Last 24 hours) at 06/14/2019 1633 Last data filed at 06/14/2019 1343 Gross per 24 hour  Intake 2645.11 ml  Output 4800 ml  Net -2154.89 ml   Filed Weights   06/11/19 1850 06/12/19 1533  Weight: 55.8 kg 60.4 kg    Examination:  General exam: Appears calm and comfortable.   Respiratory system: Clear to auscultation. Respiratory effort normal. Cardiovascular system: S1 & S2 heard, RRR. No JVD, murmurs, rubs, gallops or clicks. Gastrointestinal system: Soft, nontender, nondistended, bowel sounds positive. Central nervous system: Alert and oriented, no focal deficit Extremities: No edema, no cyanosis, pulses intact and symmetrical. Psychiatry: Judgement and insight appear normal.  DVT prophylaxis: Lovenox Code Status: DNR Family Communication: No family at bedside.  Discussed with patient. Disposition Plan: Renal functions improving, PT/OT are recommending CIR.  Patient has to discuss with her family, pending CIR evaluation for final decision. Is medically stable  to be discharged now.  If no CIR by tomorrow then she can go home.  Consultants:   Urology  Nephrology  Procedures:  Antimicrobials:   Data Reviewed: I have personally reviewed following labs and imaging studies  CBC: Recent Labs  Lab 06/11/19 1909 06/12/19 0534 06/13/19 0404 06/14/19 0542  WBC 8.9 9.4 3.7* 7.0  HGB 10.0* 8.9* 8.4* 8.6*  HCT 32.5* 29.0* 27.0* 28.2*  MCV 86.0 85.5 84.4 87.6  PLT 265 255 225 [id number]   Basic Metabolic Panel: Recent Labs  Lab 06/11/19 1909 06/11/19 2357 06/12/19 0534 06/13/19 0404 06/14/19 0542  NA 139 140 145 147* 144  K 7.2* >7.5* 4.5 3.6 3.7  CL 104 104 114* 107 106  CO2 16* 17* 17* 29 29  GLUCOSE 89 87 35* 168* 90  BUN 98* 97* 82* 54* 25*  CREATININE 13.01* 12.98* 10.35* 5.29* 1.87*  CALCIUM 8.8* 8.9 7.7* 8.2* 8.3*  PHOS  --   --   --  4.9* 2.3*   GFR: Estimated Creatinine Clearance: 20.5 mL/min (A) (by C-G formula based on SCr of 1.87 mg/dL (H)). Liver Function Tests: Recent Labs  Lab 06/11/19 2357 06/13/19 0404 06/14/19 0542  AST 15  --   --   ALT 12  --   --   ALKPHOS 50  --   --   BILITOT 1.0  --   --   PROT 7.5  --   --   ALBUMIN 3.9 3.2* 2.8*   No results for input(s): LIPASE, AMYLASE in the last 168 hours. No results for input(s): AMMONIA in the last 168 hours. Coagulation Profile: No results for input(s): INR, PROTIME in the last 168 hours. Cardiac Enzymes: No results for input(s): CKTOTAL, CKMB, CKMBINDEX, TROPONINI in the last 168 hours. BNP (last 3 results) No results for input(s): PROBNP in the last 8760 hours. HbA1C: No results for input(s): HGBA1C in the last 72 hours. CBG: Recent Labs  Lab 06/12/19 0951 06/12/19 1209 06/12/19 1408 06/12/19 1529 06/12/19 1954  GLUCAP 113* 100* 105* 93 162*   Lipid Profile: No results for input(s): CHOL, HDL, LDLCALC, TRIG, CHOLHDL, LDLDIRECT in the last 72 hours. Thyroid Function Tests: No results for input(s): TSH, T4TOTAL, FREET4, T3FREE, THYROIDAB in the  last 72 hours. Anemia Panel: Recent Labs    06/14/19 0542  VITAMINB12 235  FOLATE 6.4  FERRITIN 40  TIBC 253  IRON 36  RETICCTPCT 1.2   Sepsis Labs: No results for input(s): PROCALCITON, LATICACIDVEN in the last 168 hours.  Recent Results (from the past 240 hour(s))  Respiratory Panel by RT PCR (Flu A&B, Covid) - Nasopharyngeal Swab     Status: None   Collection Time: 06/11/19 11:57 PM   Specimen: Nasopharyngeal Swab  Result Value Ref Range Status   SARS Coronavirus 2 by RT PCR NEGATIVE NEGATIVE Final    Comment: (NOTE) SARS-CoV-2 target nucleic acids are NOT DETECTED. The SARS-CoV-2 RNA is generally detectable in upper respiratoy specimens during the acute phase of infection. The lowest concentration of SARS-CoV-2 viral copies this assay can detect is 131 copies/mL. A negative result does not preclude SARS-Cov-2 infection and should not be used as the sole basis for treatment or other patient management decisions. A negative result may occur with  improper specimen collection/handling, submission of specimen other than nasopharyngeal swab, presence of viral mutation(s) within the areas targeted by this assay, and inadequate number of viral copies (<131 copies/mL). A negative result must be combined with clinical observations, patient history, and epidemiological information. The expected result is Negative. Fact Sheet for Patients:  PinkCheek.be Fact Sheet for Healthcare Providers:  GravelBags.it This test is not yet ap proved or cleared by the [place] FDA and  has been authorized for detection and/or diagnosis of SARS-CoV-2 by FDA under an Emergency Use Authorization (EUA). This EUA will remain  in effect (meaning this test can be used) for the duration of the COVID-19 declaration under Section 564(b)(1) of the Act, 21 U.S.C. section 360bbb-3(b)(1), unless the authorization is terminated or revoked sooner.     Influenza A by PCR NEGATIVE NEGATIVE Final   Influenza B by PCR NEGATIVE NEGATIVE Final    Comment: (NOTE) The Xpert Xpress SARS-CoV-2/FLU/RSV assay is intended as an aid in  the diagnosis of influenza from Nasopharyngeal swab specimens and  should not be used as a sole basis for treatment. Nasal washings and  aspirates are unacceptable for Xpert Xpress SARS-CoV-2/FLU/RSV  testing. Fact Sheet for Patients: PinkCheek.be Fact Sheet for Healthcare Providers: GravelBags.it This test is not yet approved or cleared by the [place] FDA and  has been authorized for detection and/or diagnosis of SARS-CoV-2 by  FDA under an Emergency Use Authorization (EUA). This EUA will remain  in effect (meaning this test can be used) for the duration of the  Covid-19 declaration under Section 564(b)(1) of the Act, 21  U.S.C. section 360bbb-3(b)(1), unless the authorization is  terminated or revoked. Performed at [facility], [address] [place], [place] [address]   Urine Culture     Status: None   Collection Time: 06/12/19  1:49 PM   Specimen: PATH Cytology Urine  Result Value Ref Range Status   Specimen Description   Final    CYSTOSCOPY Performed at [facility], [address] [place], [place] [address]    Special Requests   Final    URINE CULTURE Performed at [facility], [address] [place], [place] [address]    Culture   Final    NO GROWTH Performed at [facility], [place] [address] [place], [place] [address]    Report Status 06/14/2019 FINAL  Final  MRSA PCR Screening     Status: None   Collection Time: 06/12/19  3:30 PM   Specimen: Nasal Mucosa; Nasopharyngeal  Result Value Ref Range Status   MRSA by PCR NEGATIVE NEGATIVE Final    Comment:        The GeneXpert MRSA Assay (FDA approved for NASAL specimens only), is one component of a comprehensive MRSA  colonization surveillance program. It is not intended to diagnose MRSA infection nor to guide or monitor treatment for MRSA infections. Performed at [facility], [address] [place], [place] [address]      Radiology Studies: No results found.  Scheduled Meds: . amLODipine  5 mg Oral Daily  . Chlorhexidine Gluconate Cloth  6 each Topical Daily  . heparin  5,000 Units Subcutaneous Q8H  . mouth rinse  15 mL Mouth Rinse BID   Continuous Infusions: . lactated ringers 100 mL/hr at 06/14/19 1328     LOS: 2 days   Time spent: 40 minutes.  Lorella Nimrod, MD [facility]  If 7PM-7AM, please contact night-coverage [email or web address]  06/14/2019, 4:33 PM  This record has been created using Systems analyst. Errors have been sought and corrected,but may not always be located. Such creation errors do not reflect on the standard of care.

## 2019-06-15 LAB — RENAL FUNCTION PANEL
Albumin: 3 g/dL — ABNORMAL LOW (ref 3.5–5.0)
Anion gap: 10 (ref 5–15)
BUN: 18 mg/dL (ref 8–23)
CO2: 28 mmol/L (ref 22–32)
Calcium: 8.5 mg/dL — ABNORMAL LOW (ref 8.9–10.3)
Chloride: 102 mmol/L (ref 98–111)
Creatinine, Ser: 1.13 mg/dL — ABNORMAL HIGH (ref 0.44–1.00)
GFR calc Af Amer: 53 mL/min — ABNORMAL LOW (ref >60)
GFR calc non Af Amer: 46 mL/min — ABNORMAL LOW (ref >60)
Glucose, Bld: 87 mg/dL (ref 70–99)
Phosphorus: 2.1 mg/dL — ABNORMAL LOW (ref 2.5–4.6)
Potassium: 2.9 mmol/L — ABNORMAL LOW (ref 3.5–5.1)
Sodium: 140 mmol/L (ref 135–145)

## 2019-06-15 LAB — GLUCOSE, CAPILLARY: Glucose-Capillary: 76 mg/dL (ref 70–99)

## 2019-06-15 LAB — BASIC METABOLIC PANEL
Anion gap: 7 (ref 5–15)
BUN: 19 mg/dL (ref 8–23)
CO2: 29 mmol/L (ref 22–32)
Calcium: 8.5 mg/dL — ABNORMAL LOW (ref 8.9–10.3)
Chloride: 104 mmol/L (ref 98–111)
Creatinine, Ser: 1.3 mg/dL — ABNORMAL HIGH (ref 0.44–1.00)
GFR calc Af Amer: 45 mL/min — ABNORMAL LOW (ref >60)
GFR calc non Af Amer: 39 mL/min — ABNORMAL LOW (ref >60)
Glucose, Bld: 98 mg/dL (ref 70–99)
Potassium: 3.3 mmol/L — ABNORMAL LOW (ref 3.5–5.1)
Sodium: 140 mmol/L (ref 135–145)

## 2019-06-15 LAB — MAGNESIUM: Magnesium: 1.4 mg/dL — ABNORMAL LOW (ref 1.7–2.4)

## 2019-06-15 MED ORDER — POTASSIUM CHLORIDE CRYS ER 20 MEQ PO TBCR: 40.0000 meq | EXTENDED_RELEASE_TABLET | Freq: Once | ORAL | Status: DC

## 2019-06-15 MED ORDER — POTASSIUM CHLORIDE CRYS ER 20 MEQ PO TBCR
40.0000 meq | EXTENDED_RELEASE_TABLET | ORAL | Status: AC
  Administered 2019-06-15 (×2): 40 meq via ORAL
  Filled 2019-06-15 (×2): qty 2

## 2019-06-15 MED ORDER — ENOXAPARIN SODIUM 40 MG/0.4ML ~~LOC~~ SOLN: 40.0000 mg | SUBCUTANEOUS | Status: DC

## 2019-06-15 MED ORDER — MAGNESIUM SULFATE 4 GM/100ML IV SOLN
4.0000 g | Freq: Once | INTRAVENOUS | Status: AC
  Administered 2019-06-15: 15:00:00 4 g via INTRAVENOUS
  Filled 2019-06-15: qty 100

## 2019-06-15 MED ORDER — AMLODIPINE BESYLATE 10 MG PO TABS
10.0000 mg | ORAL_TABLET | Freq: Every day | ORAL | Status: DC
  Administered 2019-06-15: 10 mg via ORAL
  Filled 2019-06-15: qty 1

## 2019-06-15 MED ORDER — LISINOPRIL 20 MG PO TABS
20.0000 mg | ORAL_TABLET | Freq: Every day | ORAL | Status: DC
  Administered 2019-06-15: 20 mg via ORAL
  Filled 2019-06-15: qty 1

## 2019-06-15 NOTE — Progress Notes (Signed)
[facility]  ROUNDING NOTE   Subjective:   Creatinine 1.13 (1.87)  Objective:  Vital signs in last 24 hours:  Temp:  [97.9 F (36.6 C)-98.8 F (37.1 C)] 98.5 F (36.9 C) (04/09 1306) Pulse Rate:  [64-78] 78 (04/09 1306) Resp:  [13-18] 18 (04/09 1306) BP: (137-226)/(65-92) 137/66 (04/09 1306) SpO2:  [95 %-100 %] 95 % (04/09 0751)  Weight change:  Filed Weights   06/11/19 1850 06/12/19 1533  Weight: 55.8 kg 60.4 kg    Intake/Output: I/O last 3 completed shifts: In: 1944.2 [P.O.:120; I.V.:1824.2] Out: 8700 [Urine:8700]   Intake/Output this shift:  Total I/O In: -  Out: 400 [Urine:400]  Physical Exam: General: NAD,   Head: Normocephalic, atraumatic. Moist oral mucosal membranes  Eyes: Anicteric, PERRL  Neck: Supple, trachea midline  Lungs:  Clear to auscultation  Heart: Regular rate and rhythm  Abdomen:  Soft, nontender,   Extremities: no peripheral edema.  Neurologic: Nonfocal, moving all four extremities  Skin: No lesions    Basic Metabolic Panel: Recent Labs  Lab 06/11/19 2357 06/11/19 2357 06/12/19 0534 06/12/19 0534 06/13/19 0404 06/14/19 0542 06/15/19 0632  NA 140  --  145  --  147* 144 140  K >7.5*  --  4.5  --  3.6 3.7 2.9*  CL 104  --  114*  --  107 106 102  CO2 17*  --  17*  --  29 29 28   GLUCOSE 87  --  35*  --  168* 90 87  BUN 97*  --  82*  --  54* 25* 18  CREATININE 12.98*  --  10.35*  --  5.29* 1.87* 1.13*  CALCIUM 8.9   < > 7.7*   < > 8.2* 8.3* 8.5*  MG  --   --   --   --   --   --  1.4*  PHOS  --   --   --   --  4.9* 2.3* 2.1*   < > = values in this interval not displayed.    Liver Function Tests: Recent Labs  Lab 06/11/19 2357 06/13/19 0404 06/14/19 0542 06/15/19 [id number]  AST 15  --   --   --   ALT 12  --   --   --   ALKPHOS 50  --   --   --   BILITOT 1.0  --   --   --   PROT 7.5  --   --   --   ALBUMIN 3.9 3.2* 2.8* 3.0*   No results for input(s): LIPASE, AMYLASE in the last 168 hours. No results for  input(s): AMMONIA in the last 168 hours.  CBC: Recent Labs  Lab 06/11/19 1909 06/12/19 0534 06/13/19 0404 06/14/19 0542  WBC 8.9 9.4 3.7* 7.0  HGB 10.0* 8.9* 8.4* 8.6*  HCT 32.5* 29.0* 27.0* 28.2*  MCV 86.0 85.5 84.4 87.6  PLT 265 255 225 256    Cardiac Enzymes: No results for input(s): CKTOTAL, CKMB, CKMBINDEX, TROPONINI in the last 168 hours.  BNP: Invalid input(s): POCBNP  CBG: Recent Labs  Lab 06/12/19 1209 06/12/19 1408 06/12/19 1529 06/12/19 1954 06/15/19 0750  GLUCAP 100* 105* 93 162* 80    Microbiology: Results for orders placed or performed during the hospital encounter of 06/11/19  Respiratory Panel by RT PCR (Flu A&B, Covid) - Nasopharyngeal Swab     Status: None   Collection Time: 06/11/19 11:57 PM   Specimen: Nasopharyngeal Swab  Result Value Ref Range Status  SARS Coronavirus 2 by RT PCR NEGATIVE NEGATIVE Final    Comment: (NOTE) SARS-CoV-2 target nucleic acids are NOT DETECTED. The SARS-CoV-2 RNA is generally detectable in upper respiratoy specimens during the acute phase of infection. The lowest concentration of SARS-CoV-2 viral copies this assay can detect is 131 copies/mL. A negative result does not preclude SARS-Cov-2 infection and should not be used as the sole basis for treatment or other patient management decisions. A negative result may occur with  improper specimen collection/handling, submission of specimen other than nasopharyngeal swab, presence of viral mutation(s) within the areas targeted by this assay, and inadequate number of viral copies (<131 copies/mL). A negative result must be combined with clinical observations, patient history, and epidemiological information. The expected result is Negative. Fact Sheet for Patients:  PinkCheek.be Fact Sheet for Healthcare Providers:  GravelBags.it This test is not yet ap proved or cleared by the [place] FDA and  has been  authorized for detection and/or diagnosis of SARS-CoV-2 by FDA under an Emergency Use Authorization (EUA). This EUA will remain  in effect (meaning this test can be used) for the duration of the COVID-19 declaration under Section 564(b)(1) of the Act, 21 U.S.C. section 360bbb-3(b)(1), unless the authorization is terminated or revoked sooner.    Influenza A by PCR NEGATIVE NEGATIVE Final   Influenza B by PCR NEGATIVE NEGATIVE Final    Comment: (NOTE) The Xpert Xpress SARS-CoV-2/FLU/RSV assay is intended as an aid in  the diagnosis of influenza from Nasopharyngeal swab specimens and  should not be used as a sole basis for treatment. Nasal washings and  aspirates are unacceptable for Xpert Xpress SARS-CoV-2/FLU/RSV  testing. Fact Sheet for Patients: PinkCheek.be Fact Sheet for Healthcare Providers: GravelBags.it This test is not yet approved or cleared by the [place] FDA and  has been authorized for detection and/or diagnosis of SARS-CoV-2 by  FDA under an Emergency Use Authorization (EUA). This EUA will remain  in effect (meaning this test can be used) for the duration of the  Covid-19 declaration under Section 564(b)(1) of the Act, 21  U.S.C. section 360bbb-3(b)(1), unless the authorization is  terminated or revoked. Performed at [facility], [address] [place], [place] [address]   Urine Culture     Status: None   Collection Time: 06/12/19  1:49 PM   Specimen: PATH Cytology Urine  Result Value Ref Range Status   Specimen Description   Final    CYSTOSCOPY Performed at [facility], [address], [place], [place] [address]    Special Requests   Final    URINE CULTURE Performed at [facility], [address], [place], [place] [address]    Culture   Final    NO GROWTH Performed at [facility], [place] [address] [place], [place] [address]    Report Status 06/14/2019 FINAL   Final  MRSA PCR Screening     Status: None   Collection Time: 06/12/19  3:30 PM   Specimen: Nasal Mucosa; Nasopharyngeal  Result Value Ref Range Status   MRSA by PCR NEGATIVE NEGATIVE Final    Comment:        The GeneXpert MRSA Assay (FDA approved for NASAL specimens only), is one component of a comprehensive MRSA colonization surveillance program. It is not intended to diagnose MRSA infection nor to guide or monitor treatment for MRSA infections. Performed at [facility], [address], [place], [place] [address]     Coagulation Studies: No results for  input(s): LABPROT, INR in the last 72 hours.  Urinalysis: No results for input(s): COLORURINE, LABSPEC, PHURINE, GLUCOSEU, HGBUR, BILIRUBINUR, KETONESUR, PROTEINUR, UROBILINOGEN, NITRITE, LEUKOCYTESUR in the last 72 hours.  Invalid input(s): APPERANCEUR    Imaging: No results found.   Medications:   . lactated ringers 100 mL/hr at 06/15/19 0945   . Chlorhexidine Gluconate Cloth  6 each Topical Daily  . enoxaparin (LOVENOX) injection  40 mg Subcutaneous Q24H  . lisinopril  20 mg Oral Daily  . mouth rinse  15 mL Mouth Rinse BID  . potassium chloride  40 mEq Oral Q4H   acetaminophen, bisacodyl, labetalol, magnesium hydroxide, ondansetron **OR** ondansetron (ZOFRAN) IV, sodium phosphate  Assessment/ Plan:  [name] [name] is a 79 y.o. black female  with hypertension, coronary artery disease, GERD, breast cancer, who was admitted to [facility] on 06/11/2019 for Hyperkalemia [E87.5] Generalized weakness [R53.1] AKI (acute kidney injury) ([place]) [N17.9] Acute renal failure, unspecified acute renal failure type ([place]) [N17.9]  1. Acute renal failure 2. Hyperkalemia 3. Metabolic acidosis 4. Obstructive uropathy 5. Anemia with kidney failure 6. Hypertensive urgency  Status post cystoscopy with urethral dilation on 4/6 by Dr. Bernardo Heater. Renal function is improving.  Hyperkalemia and metabolic acidosis has  improved.   - Holding lisinopril and meloxicam.  - Foley catheter as per urology   LOS: 3 Damond Borchers 4/9/20211:36 PM

## 2019-06-15 NOTE — Telephone Encounter (Signed)
Attempted to schedule patient is still in hospital

## 2019-06-15 NOTE — TOC Initial Note (Signed)
Transition of Care [facility]) - Initial/Assessment Note    Patient Details  Name: [name] [name] MRN: [id number] Date of Birth: October 17, 1940  Transition of Care [facility]) CM/SW Contact:    Shelbie Ammons, RN Phone Number: 06/15/2019, 10:12 AM  Clinical Narrative:      RNCM met with patient at bedside to discuss option for placement at CIR. Patient is alert and verbally responsive, reports to feeling well today however having trouble adjusting herself in the bed. Patient lives at home with her husband and adult son however reports she is at home during the day alone sometimes as her husband still works part time. Discussed with patient CIR recommendation and patient reports that she still wants to go home. Discussed with patient that given her current conditioning she may not be able to be left alone and will likely need someone with her 24/7. Patient again reports she wants to go home but she would be willing to possibly go to rehab locally if it means she can be stronger when she goes home. Patient is agreeable to bed search.  Notified CIR of patient refusal.  PASSR completed, FL2 completed and bed search initiated.          Expected Discharge Plan: Skilled Nursing Facility Barriers to Discharge: Barriers Resolved   Patient Goals and CMS Choice        Expected Discharge Plan and Services Expected Discharge Plan: [place]   Discharge Planning Services: CM Consult   Living arrangements for the past 2 months: Single Family Home                                      Prior Living Arrangements/Services Living arrangements for the past 2 months: Single Family Home Lives with:: Spouse, Adult Children Patient language and need for interpreter reviewed:: Yes Do you feel safe going back to the place where you live?: Yes      Need for Family Participation in Patient Care: Yes (Comment) Care giver support system in place?: Yes (comment)   Criminal Activity/Legal Involvement  Pertinent to Current Situation/Hospitalization: No - Comment as needed  Activities of Daily Living Home Assistive Devices/Equipment: None ADL Screening (condition at time of admission) Patient's cognitive ability adequate to safely complete daily activities?: Yes Is the patient deaf or have difficulty hearing?: No Does the patient have difficulty seeing, even when wearing glasses/contacts?: No Does the patient have difficulty concentrating, remembering, or making decisions?: Yes Patient able to express need for assistance with ADLs?: Yes Does the patient have difficulty dressing or bathing?: No Independently performs ADLs?: Yes (appropriate for developmental age) Does the patient have difficulty walking or climbing stairs?: Yes(bad right knee) Weakness of Legs: Right Weakness of Arms/Hands: None  Permission Sought/Granted                  Emotional Assessment Appearance:: Appears stated age Attitude/Demeanor/Rapport: Engaged Affect (typically observed): Appropriate Orientation: : Oriented to Self, Oriented to Place, Oriented to  Time, Oriented to Situation Alcohol / Substance Use: Not Applicable Psych Involvement: No (comment)  Admission diagnosis:  Hyperkalemia [E87.5] Generalized weakness [R53.1] AKI (acute kidney injury) ([place]) [N17.9] Acute renal failure, unspecified acute renal failure type ([place]) [N17.9] Patient Active Problem List   Diagnosis Date Noted  . History of breast cancer 06/12/2019  . Acute renal failure ([place]) 06/12/2019  . Generalized weakness 06/12/2019  . Hyperkalemia 06/12/2019  . Hypertensive urgency 06/12/2019  .  Disorder of bursae of shoulder region 10/20/2018  . Lower GI bleeding 09/10/2018  . Hiatal hernia   . Melena   . Polyp of ascending colon   . Diverticulosis of large intestine without diverticulitis   . GIB (gastrointestinal bleeding) 09/05/2018  . LVH (left ventricular hypertrophy) due to hypertensive disease, without heart failure  04/10/2018  . Chest pain 03/31/2018  . Vasovagal syncope 03/20/2018  . Lichen planus 90/93/1121  . Malignant neoplasm of upper-inner quadrant of right breast in female, estrogen receptor positive ([place]) 07/28/2016  . Gastric ulcer requiring drug therapy, chronic 01/23/2016  . MI (mitral incompetence) 07/22/2015  . Combined fat and carbohydrate induced hyperlipemia 12/26/2014  . UTI (lower urinary tract infection) 08/18/2014  . Blurry vision 08/18/2014  . Allergic rhinitis 07/12/2014  . Absolute anemia 07/12/2014  . Baker's cyst of knee 07/12/2014  . Atherosclerosis of coronary artery 07/12/2014  . CAFL (chronic airflow limitation) ([place]) 07/12/2014  . Dizziness 07/12/2014  . Acid reflux 07/12/2014  . 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 History of colon polyps 07/12/2014  . Hypercholesteremia 07/12/2014  . Malaise and fatigue 07/12/2014  . Heart attack ([place]) 07/12/2014  . Muscle ache 07/12/2014  . Arthritis, degenerative 07/12/2014  . Bradycardia 08/23/2013  . CAD (coronary artery disease) 08/17/2013  . Diabetes ([place]) 08/17/2013  . Diabetes mellitus ([place]) 08/17/2013  . Peripheral vascular disease ([place]) 08/17/2013   PCP:  Jerrol Banana., MD Pharmacy:   [id number] [place]Phone: [phone]: [phone] [place] [place]Phone: [phone]: [phone]    Social Determinants of Health (SDOH) Interventions    Readmission Risk Interventions No flowsheet data found.

## 2019-06-15 NOTE — Progress Notes (Signed)
Occupational Therapy Treatment Patient Details Name: [name] [name] MRN: [id number] DOB: 22-Oct-1940 Today's Date: 06/15/2019    History of present illness 79 yo female with onset of SOB and weakness was admitted for evaluation.  Received foley, had AKI wiht metabolic acidosis, elevated K+, hypertensive urgency (resolved) and suspected bladder outlet obstruction.  Noted hydronephrosis, atelectasis, duodenitis, diverticulosis.  PMHx:  atherosclerosis, HTN, CAD, breast CA, MI,    OT comments  Ms Fronek was seen for OT treatment on this date. Upon arrival to room pt reclined in bed performing therex c PT. Pt and RN state pt is expected to d/c home this PM and pt has yet to perform in room functional mobility. PT completes sit<>stand trials and hands off to OT for standing ADL trial. Pt instructed in falls prevention, d/c recommendations, DME recommendations, home/routine modifications, and safe RW technique. Requiring MIN A + RW for simulated toilet t/f - anticipate MIN A for peri-hygiene and clothing management. CGA + RW face washing and tooth brushing standing sink side c one self-corrected LOB. Pt was A&O x3, disoriented to time. Pt required MOD VCs for RW management t/o mobility and during education pt called out for [name] (her husband) before asking OT if he was in the room. Pt verbalized understanding of instruction provided. Pt making good progress toward goals. Pt continues to benefit from skilled OT services to maximize return to PLOF and minimize risk of future falls, injury, caregiver burden, and readmission. Will continue to follow POC. Discharge recommendation remains appropriate.    Follow Up Recommendations  CIR    Equipment Recommendations  3 in 1 bedside commode    Recommendations for Other Services      Precautions / Restrictions Precautions Precautions: Fall Restrictions Weight Bearing Restrictions: No       Mobility Bed Mobility Overal bed mobility: Needs  Assistance Bed Mobility: Sit to Supine;Supine to Sit     Supine to sit: Min guard;HOB elevated Sit to supine: Min assist;HOB elevated   General bed mobility comments: min A for LE advancement returning to bed, min guard to get EOB with pt requiring sig increased time and effort, use of bed rails  Transfers Overall transfer level: Needs assistance Equipment used: Rolling walker (2 wheeled) Transfers: Sit to/from Stand Sit to Stand: Min assist Stand pivot transfers: Min assist       General transfer comment: MIN A for RW management and controlled descent stand>sit at EOB    Balance Overall balance assessment: Needs assistance Sitting-balance support: Feet supported Sitting balance-Leahy Scale: Good Sitting balance - Comments: overall steady sitting EOB   Standing balance support: Bilateral upper extremity supported;During functional activity Standing balance-Leahy Scale: Fair Standing balance comment: Steady standing balance reaching inside BOS during functional grooming activity standing sink side c one LOB - able to self correct by grabbing counter. Pt stands with narrow BOS                           ADL either performed or assessed with clinical judgement   ADL Overall ADL's : Needs assistance/impaired                                       General ADL Comments: MIN A + RW toilet t/f anticipate MIN A for peri-hygiene and clothing management. CGA + RW face washing and tooth brushing standing sink side c one  self-corrected LOB     Vision       Perception     Praxis      Cognition Arousal/Alertness: Awake/alert Behavior During Therapy: WFL for tasks assessed/performed Overall Cognitive Status: No family/caregiver present to determine baseline cognitive functioning                                 General Comments: A&O x3, disoriented to time (unable to state month, when told April stated almost Easter). Pt called out  repeatedly to "[name]" then asked if he was in the room (pt's husband - not present).         Exercises Total Joint Exercises Heel Slides: AROM;Both;10 reps Hip ABduction/ADduction: AROM;Both;10 reps Straight Leg Raises: AROM;Both;10 reps Other Exercises Other Exercises: Pt educated re: falls prevention, d/c recommendations, DME recommendations, home/routine modifications RW technique  Other Exercises: bed mobility, sit<>stand, in room mobility, tooth brushing, face washing   Shoulder Instructions       General Comments      Pertinent Vitals/ Pain       Pain Assessment: Faces Faces Pain Scale: Hurts a little bit Pain Location: did not specify Pain Descriptors / Indicators: Sore;Discomfort Pain Intervention(s): Monitored during session  Home Living                                          Prior Functioning/Environment              Frequency  Min 3X/week        Progress Toward Goals  OT Goals(current goals can now be found in the care plan section)  Progress towards OT goals: Progressing toward goals  Acute Rehab OT Goals Patient Stated Goal: get home and feel better OT Goal Formulation: With patient Time For Goal Achievement: 06/28/19 Potential to Achieve Goals: Good ADL Goals Pt Will Perform Grooming: sitting;with set-up;with supervision(With LRAD PRN for improved safety and fxl independence.) Pt Will Perform Upper Body Dressing: sitting;with supervision;with set-up;with caregiver independent in assisting(With LRAD PRN for improved safety and fxl independence.) Pt Will Perform Lower Body Dressing: sit to/from stand;with min guard assist;with caregiver independent in assisting(With LRAD PRN for improved safety and fxl independence.) Additional ADL Goal #1: Pt will independently verbalize a plan to implement at least 3 learned falls prevention strategies for improved safety and functional independence upon hospital DC.  Plan Discharge plan  remains appropriate;Frequency remains appropriate    Co-evaluation                 AM-PAC OT "6 Clicks" Daily Activity     Outcome Measure   Help from another person eating meals?: A Little Help from another person taking care of personal grooming?: A Little Help from another person toileting, which includes using toliet, bedpan, or urinal?: A Lot Help from another person bathing (including washing, rinsing, drying)?: A Lot Help from another person to put on and taking off regular upper body clothing?: A Little Help from another person to put on and taking off regular lower body clothing?: A Lot 6 Click Score: 15    End of Session Equipment Utilized During Treatment: Gait belt;Rolling walker  OT Visit Diagnosis: Other abnormalities of gait and mobility (R26.89);Muscle weakness (generalized) (M62.81)   Activity Tolerance Patient tolerated treatment well   Patient Left in bed;with call bell/phone within reach;with  bed alarm set   Nurse Communication          Time: [phone] OT Time Calculation (min): 24 min  Charges: OT General Charges $OT Visit: 1 Visit OT Treatments $Self Care/Home Management : 23-37 mins  Dessie Coma, M.S. OTR/L  06/15/19, 4:54 PM

## 2019-06-15 NOTE — Discharge Summary (Signed)
Physician Discharge Summary  [name] [id number] DOB: Aug 03, 1940 DOA: 06/11/2019  PCP: Jerrol Banana., MD  Admit date: 06/11/2019 Discharge date: 06/15/2019  Admitted From: Home Disposition:  Home  Recommendations for Outpatient Follow-up:  1. Follow up with PCP in 1-2 weeks 2. Follow up with urology in 2 weeks. 3. Please obtain BMP/CBC in one week 4. Please follow up on the following pending results:None  Home Health:Yes Equipment/Devices:None Discharge Condition: Stable CODE STATUS: DNR Diet recommendation: Heart Healthy   Brief/Interim Summary: [name] B McBroomis a 79 y.o.femalewith medical history significant forhypertension, CAD and breast cancer on letrozole who presents to the emergency room with a 1 day history of generalized weakness, inability to ambulate per her usual. She denies associated numbness or tingling in the face or extremities the weakness is not her whole body, not one-sided. She denies headache or blurred vision, denies chest pain, shortness of breath.She denies GI or GU complaints.  She was found to have creatinine of 13 with baseline below 1.  Abdomen and pelvis with bilateral hydronephrosis and distended ureter and bladder and potassium of 7.5.  Urology was consulted, multiple failed attempts to place Foley.  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 Renal function started improving since then and creatinine was 1.13 on the day of discharge.  She was also have severe hyperkalemia on presentation was managed medically.  She never required dialysis.  On the day of discharge she was found to have hypokalemic with hypomagnesemia which were repleted.  Patient has an history of hypertension and take lisinopril at home.  Her home dose of lisinopril was held  initially due to AKI and blood pressure was managed with amlodipine and as needed labetalol.  She will resume her home dose of lisinopril on discharge.  Patient has an history of coronary artery disease.  She remained chest pain-free. She is not on any home meds which include aspirin or statin.  She will follow-up with her primary care physician for further recommendations.  She will continue her home dose of letrozole for prior breast cancer.  Discharge Diagnoses:  Active Problems:   CAD (coronary artery disease)   History of breast cancer   Acute renal failure (HCC)   Generalized weakness   Hyperkalemia   Hypertensive urgency  Discharge Instructions  Discharge Instructions    Diet - low sodium heart healthy   Complete by: As directed    Discharge instructions   Complete by: As directed    It was pleasure taking care of you. Keep yourself well-hydrated and follow-up with urology in 2 weeks. Catheter will remain in till you will see your urologist.   Increase activity slowly   Complete by: As directed      Allergies as of 06/15/2019      Reactions   Celebrex  [celecoxib]    Dark stools.   Penicillins Other (See Comments)   Childhood reaction Has patient had a PCN reaction causing immediate rash, facial/tongue/throat swelling, SOB or lightheadedness with hypotension: Unknown Has patient had a PCN reaction causing severe rash involving mucus membranes or skin necrosis: Unknown Has patient had a PCN reaction that required hospitalization: Unknown Has patient had a PCN reaction occurring within the last 10 years: Unknown If all of the above answers are "NO", then may proceed with  Cephalosporin use.   Prevacid [lansoprazole] Diarrhea      Medication List    STOP taking these medications   meloxicam 7.5 MG tablet Commonly known as: MOBIC     TAKE these medications   CALCIUM 500+D PO Take by mouth every other day.   lisinopril 20 MG tablet Commonly known as:  ZESTRIL Take 20 mg by mouth daily.   omeprazole 20 MG capsule Commonly known as: PRILOSEC TAKE 1 CAPSULE (20 MG TOTAL) BY MOUTH DAILY AS NEEDED (INDIGESTION). What changed: See the new instructions.       Allergies  Allergen Reactions  . Celebrex  [Celecoxib]     Dark stools.  . Penicillins Other (See Comments)    Childhood reaction Has patient had a PCN reaction causing immediate rash, facial/tongue/throat swelling, SOB or lightheadedness with hypotension: Unknown Has patient had a PCN reaction causing severe rash involving mucus membranes or skin necrosis: Unknown Has patient had a PCN reaction that required hospitalization: Unknown Has patient had a PCN reaction occurring within the last 10 years: Unknown If all of the above answers are "NO", then may proceed with Cephalosporin use.   Marland Kitchen Prevacid [Lansoprazole] Diarrhea    Consultations:  Nephrology  Urology  Procedures/Studies: [facility] Chest Port 1 View  Result Date: 06/11/2019 CLINICAL DATA:  Weakness. EXAM: PORTABLE CHEST 1 VIEW COMPARISON:  March 31, 2018 FINDINGS: The heart size is stable. Aortic calcifications are noted. There is a large hiatal hernia. There is no pneumothorax. No significant pleural effusion. There is some atelectasis at the lung bases. There is no acute osseous abnormality. IMPRESSION: No acute cardiopulmonary disease. Large hiatal hernia. Electronically Signed   By: Constance Holster M.D.   On: 06/11/2019 23:52   MM DIAG BREAST TOMO BILATERAL  Result Date: 05/28/2019 CLINICAL DATA:  Right lumpectomy.  Annual mammography. EXAM: DIGITAL DIAGNOSTIC BILATERAL MAMMOGRAM WITH CAD AND TOMO COMPARISON:  Previous exam(s). ACR Breast Density Category b: There are scattered areas of fibroglandular density. FINDINGS: The right lumpectomy site is stable in appears as expected. No evidence of malignancy in either breast. Mammographic images were processed with CAD. IMPRESSION: No mammographic evidence of malignancy.  RECOMMENDATION: Annual diagnostic mammography. I have discussed the findings and recommendations with the patient. If applicable, a reminder letter will be sent to the patient regarding the next appointment. BI-RADS CATEGORY  2: Benign. Electronically Signed   By: Dorise Bullion III M.D   On: 05/28/2019 13:50   CT Renal Stone Study  Result Date: 06/12/2019 CLINICAL DATA:  Weakness. EXAM: CT ABDOMEN AND PELVIS WITHOUT CONTRAST TECHNIQUE: Multidetector CT imaging of the abdomen and pelvis was performed following the standard protocol without IV contrast. COMPARISON:  January 21, 2015 FINDINGS: Lower chest: Mild atelectasis is seen within the right lung base. Hepatobiliary: A diminutive left lobe of the liver is noted. Stable 3.0 cm x 2.5 cm, 6.1 cm x 4.5 cm and 3.7 cm x 2.6 cm cysts are seen within the anterior aspect of the right lobe of the liver. An additional 3.9 cm x 2.9 cm area of mildly increased attenuation is noted within the anteromedial aspect of the right lobe. This is seen on the prior study and is decreased in size on the current exam. There is a mild amount of perihepatic fluid. The gallbladder is poorly visualized and may be contracted. Pancreas: Pancreas is normal in appearance without evidence of pancreatic ductal dilatation. Spleen: A mild amount of perisplenic fluid is seen along the anterior and lateral aspects of an  otherwise normal-appearing spleen. Adrenals/Urinary Tract: Adrenal glands are unremarkable. The kidneys are normal in size, without renal calculi or focal lesions. Moderate to marked severity bilateral hydronephrosis and hydroureter are seen. The urinary bladder is markedly distended. Multiple large urinary bladder diverticula are seen. These are increased in size when compared to the prior study. The largest measures approximately 10.0 cm x 9.2 cm and is located along the anterolateral aspect of the urinary bladder on the right. Stomach/Bowel: A very large gastric hernia is  seen. This is present on the prior study. The appendix is not clearly identified. No evidence of bowel dilatation. Numerous noninflamed diverticula are seen throughout the large bowel. A marked amount of inflammatory fat stranding is seen within the abdomen on the right. This surrounds the proximal portion of the duodenum and extends inferiorly within the right abdomen along the anterior and posterior pararenal spaces. Vascular/Lymphatic: There is marked severity aortic calcification. No enlarged abdominal or pelvic lymph nodes. Reproductive: Status post hysterectomy. No adnexal masses. Other: A mild-to-moderate amount of fluid is seen within the lateral aspect of the abdomen on the right. This extends inferiorly into the right hemipelvis. A small amount of posterior pelvic free fluid is noted. Musculoskeletal: Multilevel degenerative changes seen throughout the lumbar spine. IMPRESSION: 1. Marked severity inflammatory process within the right abdomen within extensive amount of associated inflammatory fat stranding, as described above. Given the pattern of inflammatory stranding, this is likely consistent with marked severity duodenitis, however, correlation with a contrast-enhanced CT evaluation of the abdomen and pelvis is recommended. 2. Colonic diverticulosis. 3. Moderate to marked severity bilateral hydronephrosis and hydroureter 4. Mild-to-moderate amount of fluid is seen within the lateral aspect of the abdomen on the right. This extends inferiorly into the right hemipelvis. 5. Large gastric hernia. 6. Stable hepatic cysts. 7. Stable 3.9 cm x 2.9 cm area of mildly increased attenuation within the anteromedial aspect of the right lobe of the liver which may represent a previously collapsed hepatic cyst. 8. Multiple large urinary bladder diverticula, increased in size when compared to the prior study. Aortic Atherosclerosis (ICD10-I70.0). Electronically Signed   By: Virgina Norfolk M.D.   On: 06/12/2019 03:02      Subjective: Patient was feeling better when seen today.  She was offered CIR after having a PT evaluation but she wants to go home with home health services which were ordered.  Discharge Exam: Vitals:   06/15/19 0751 06/15/19 1306  BP: (!) 181/73 137/66  Pulse: 67 78  Resp: 18 18  Temp:  98.5 F (36.9 C)  SpO2: 95%    Vitals:   06/14/19 2354 06/14/19 2355 06/15/19 0751 06/15/19 1306  BP: (!) 174/79 (!) 169/69 (!) 181/73 137/66  Pulse: 65 64 67 78  Resp: 18  18 18   Temp: 98.8 F (37.1 C)   98.5 F (36.9 C)  TempSrc:    Oral  SpO2: 98%  95%   Weight:      Height:        General: Pt is alert, awake, not in acute distress Cardiovascular: RRR, S1/S2 +, no rubs, no gallops Respiratory: CTA bilaterally, no wheezing, no rhonchi Abdominal: Soft, NT, ND, bowel sounds + Extremities: no edema, no cyanosis   The results of significant diagnostics from this hospitalization (including imaging, microbiology, ancillary and laboratory) are listed below for reference.    Microbiology: Recent Results (from the past 240 hour(s))  Respiratory Panel by RT PCR (Flu A&B, Covid) - Nasopharyngeal Swab     Status:  None   Collection Time: 06/11/19 11:57 PM   Specimen: Nasopharyngeal Swab  Result Value Ref Range Status   SARS Coronavirus 2 by RT PCR NEGATIVE NEGATIVE Final    Comment: (NOTE) SARS-CoV-2 target nucleic acids are NOT DETECTED. The SARS-CoV-2 RNA is generally detectable in upper respiratoy specimens during the acute phase of infection. The lowest concentration of SARS-CoV-2 viral copies this assay can detect is 131 copies/mL. A negative result does not preclude SARS-Cov-2 infection and should not be used as the sole basis for treatment or other patient management decisions. A negative result may occur with  improper specimen collection/handling, submission of specimen other than nasopharyngeal swab, presence of viral mutation(s) within the areas targeted by this assay,  and inadequate number of viral copies (<131 copies/mL). A negative result must be combined with clinical observations, patient history, and epidemiological information. The expected result is Negative. Fact Sheet for Patients:  PinkCheek.be Fact Sheet for Healthcare Providers:  GravelBags.it This test is not yet ap proved or cleared by the [place] FDA and  has been authorized for detection and/or diagnosis of SARS-CoV-2 by FDA under an Emergency Use Authorization (EUA). This EUA will remain  in effect (meaning this test can be used) for the duration of the COVID-19 declaration under Section 564(b)(1) of the Act, 21 U.S.C. section 360bbb-3(b)(1), unless the authorization is terminated or revoked sooner.    Influenza A by PCR NEGATIVE NEGATIVE Final   Influenza B by PCR NEGATIVE NEGATIVE Final    Comment: (NOTE) The Xpert Xpress SARS-CoV-2/FLU/RSV assay is intended as an aid in  the diagnosis of influenza from Nasopharyngeal swab specimens and  should not be used as a sole basis for treatment. Nasal washings and  aspirates are unacceptable for Xpert Xpress SARS-CoV-2/FLU/RSV  testing. Fact Sheet for Patients: PinkCheek.be Fact Sheet for Healthcare Providers: GravelBags.it This test is not yet approved or cleared by the [place] FDA and  has been authorized for detection and/or diagnosis of SARS-CoV-2 by  FDA under an Emergency Use Authorization (EUA). This EUA will remain  in effect (meaning this test can be used) for the duration of the  Covid-19 declaration under Section 564(b)(1) of the Act, 21  U.S.C. section 360bbb-3(b)(1), unless the authorization is  terminated or revoked. Performed at [facility], [address] [place], [place] [address]   Urine Culture     Status: None   Collection Time: 06/12/19  1:49 PM   Specimen: PATH Cytology  Urine  Result Value Ref Range Status   Specimen Description   Final    CYSTOSCOPY Performed at [facility], [address] [place], [place] [address]    Special Requests   Final    URINE CULTURE Performed at [facility], [address], [place], [place] [address]    Culture   Final    NO GROWTH Performed at [facility], [place] [address], [place],  [address]    Report Status 06/14/2019 FINAL  Final  MRSA PCR Screening     Status: None   Collection Time: 06/12/19  3:30 PM   Specimen: Nasal Mucosa; Nasopharyngeal  Result Value Ref Range Status   MRSA by PCR NEGATIVE NEGATIVE Final    Comment:        The GeneXpert MRSA Assay (FDA approved for NASAL specimens only), is one component of a comprehensive MRSA colonization surveillance program. It is not intended to diagnose MRSA infection nor to guide or monitor treatment for MRSA infections.  Performed at [facility], [address] [place], [place]  [address]      Labs: BNP (last 3 results) No results for input(s): BNP in the last 8760 hours. Basic Metabolic Panel: Recent Labs  Lab 06/12/19 0534 06/13/19 0404 06/14/19 0542 06/15/19 0632 06/15/19 1355  NA 145 147* 144 140 140  K 4.5 3.6 3.7 2.9* 3.3*  CL 114* 107 106 102 104  CO2 17* 29 29 28 29   GLUCOSE 35* 168* 90 87 98  BUN 82* 54* 25* 18 19  CREATININE 10.35* 5.29* 1.87* 1.13* 1.30*  CALCIUM 7.7* 8.2* 8.3* 8.5* 8.5*  MG  --   --   --  1.4*  --   PHOS  --  4.9* 2.3* 2.1*  --    Liver Function Tests: Recent Labs  Lab 06/11/19 2357 06/13/19 0404 06/14/19 0542 06/15/19 0632  AST 15  --   --   --   ALT 12  --   --   --   ALKPHOS 50  --   --   --   BILITOT 1.0  --   --   --   PROT 7.5  --   --   --   ALBUMIN 3.9 3.2* 2.8* 3.0*   No results for input(s): LIPASE, AMYLASE in the last 168 hours. No results for input(s): AMMONIA in the last 168 hours. CBC: Recent Labs  Lab 06/11/19 1909 06/12/19 0534  06/13/19 0404 06/14/19 0542  WBC 8.9 9.4 3.7* 7.0  HGB 10.0* 8.9* 8.4* 8.6*  HCT 32.5* 29.0* 27.0* 28.2*  MCV 86.0 85.5 84.4 87.6  PLT 265 255 225 256   Cardiac Enzymes: No results for input(s): CKTOTAL, CKMB, CKMBINDEX, TROPONINI in the last 168 hours. BNP: Invalid input(s): POCBNP CBG: Recent Labs  Lab 06/12/19 1209 06/12/19 1408 06/12/19 1529 06/12/19 1954 06/15/19 0750  GLUCAP 100* 105* 93 162* 76   D-Dimer No results for input(s): DDIMER in the last 72 hours. Hgb A1c No results for input(s): HGBA1C in the last 72 hours. Lipid Profile No results for input(s): CHOL, HDL, LDLCALC, TRIG, CHOLHDL, LDLDIRECT in the last 72 hours. Thyroid function studies No results for input(s): TSH, T4TOTAL, T3FREE, THYROIDAB in the last 72 hours.  Invalid input(s): FREET3 Anemia work up Recent Labs    06/14/19 0542  VITAMINB12 235  FOLATE 6.4  FERRITIN 40  TIBC 253  IRON 36  RETICCTPCT 1.2   Urinalysis    Component Value Date/Time   COLORURINE STRAW (A) 09/12/2018 1003   APPEARANCEUR CLEAR (A) 09/12/2018 1003   APPEARANCEUR Cloudy (A) 04/25/2015 1449   LABSPEC 1.009 09/12/2018 1003   LABSPEC 1.021 01/17/2014 1524   PHURINE 5.0 09/12/2018 1003   GLUCOSEU NEGATIVE 09/12/2018 1003   GLUCOSEU Negative 01/17/2014 1524   HGBUR NEGATIVE 09/12/2018 1003   BILIRUBINUR NEGATIVE 09/12/2018 1003   BILIRUBINUR neg 06/30/2016 1619   BILIRUBINUR Negative 04/25/2015 1449   BILIRUBINUR Negative 01/17/2014 1524   KETONESUR 5 (A) 09/12/2018 1003   PROTEINUR NEGATIVE 09/12/2018 1003   UROBILINOGEN 0.2 06/30/2016 1619   NITRITE NEGATIVE 09/12/2018 1003   LEUKOCYTESUR NEGATIVE 09/12/2018 1003   LEUKOCYTESUR 2+ 01/17/2014 1524   Sepsis Labs Invalid input(s): PROCALCITONIN,  WBC,  LACTICIDVEN Microbiology Recent Results (from the past 240 hour(s))  Respiratory Panel by RT PCR (Flu A&B, Covid) - Nasopharyngeal Swab     Status: None   Collection Time: 06/11/19 11:57 PM   Specimen:  Nasopharyngeal Swab  Result Value Ref Range Status   SARS  Coronavirus 2 by RT PCR NEGATIVE NEGATIVE Final    Comment: (NOTE) SARS-CoV-2 target nucleic acids are NOT DETECTED. The SARS-CoV-2 RNA is generally detectable in upper respiratoy specimens during the acute phase of infection. The lowest concentration of SARS-CoV-2 viral copies this assay can detect is 131 copies/mL. A negative result does not preclude SARS-Cov-2 infection and should not be used as the sole basis for treatment or other patient management decisions. A negative result may occur with  improper specimen collection/handling, submission of specimen other than nasopharyngeal swab, presence of viral mutation(s) within the areas targeted by this assay, and inadequate number of viral copies (<131 copies/mL). A negative result must be combined with clinical observations, patient history, and epidemiological information. The expected result is Negative. Fact Sheet for Patients:  PinkCheek.be Fact Sheet for Healthcare Providers:  GravelBags.it This test is not yet ap proved or cleared by the [place] FDA and  has been authorized for detection and/or diagnosis of SARS-CoV-2 by FDA under an Emergency Use Authorization (EUA). This EUA will remain  in effect (meaning this test can be used) for the duration of the COVID-19 declaration under Section 564(b)(1) of the Act, 21 U.S.C. section 360bbb-3(b)(1), unless the authorization is terminated or revoked sooner.    Influenza A by PCR NEGATIVE NEGATIVE Final   Influenza B by PCR NEGATIVE NEGATIVE Final    Comment: (NOTE) The Xpert Xpress SARS-CoV-2/FLU/RSV assay is intended as an aid in  the diagnosis of influenza from Nasopharyngeal swab specimens and  should not be used as a sole basis for treatment. Nasal washings and  aspirates are unacceptable for Xpert Xpress SARS-CoV-2/FLU/RSV  testing. Fact Sheet for  Patients: PinkCheek.be Fact Sheet for Healthcare Providers: GravelBags.it This test is not yet approved or cleared by the [place] FDA and  has been authorized for detection and/or diagnosis of SARS-CoV-2 by  FDA under an Emergency Use Authorization (EUA). This EUA will remain  in effect (meaning this test can be used) for the duration of the  Covid-19 declaration under Section 564(b)(1) of the Act, 21  U.S.C. section 360bbb-3(b)(1), unless the authorization is  terminated or revoked. Performed at [facility], [address], [place], [place] [address]   Urine Culture     Status: None   Collection Time: 06/12/19  1:49 PM   Specimen: PATH Cytology Urine  Result Value Ref Range Status   Specimen Description   Final    CYSTOSCOPY Performed at [facility], [address] [place], [place] [address]    Special Requests   Final    URINE CULTURE Performed at [facility], [address] [place], [place] [address]    Culture   Final    NO GROWTH Performed at [facility], [place] [address] [place], [place] [address]    Report Status 06/14/2019 FINAL  Final  MRSA PCR Screening     Status: None   Collection Time: 06/12/19  3:30 PM   Specimen: Nasal Mucosa; Nasopharyngeal  Result Value Ref Range Status   MRSA by PCR NEGATIVE NEGATIVE Final    Comment:        The GeneXpert MRSA Assay (FDA approved for NASAL specimens only), is one component of a comprehensive MRSA colonization surveillance program. It is not intended to diagnose MRSA infection nor to guide or monitor treatment for MRSA infections. Performed at [facility], [address] [place], [place] [address]     Time coordinating discharge: Over 30 minutes  SIGNED:  Lorella Nimrod, MD  [facility] 06/15/2019, 3:09 PM  If 7PM-7AM, please contact night-coverage [email or web address]  This record has been  created using Systems analyst. Errors have been sought and corrected,but may not always be located. Such creation errors do not reflect on the standard of care.

## 2019-06-15 NOTE — Progress Notes (Signed)
Physical Therapy Treatment Patient Details Name: [name] [name] MRN: [id number] DOB: [birth year] Today's Date: 06/15/2019    History of Present Illness 79 yo female with onset of SOB and weakness was admitted for evaluation.  Received foley, had AKI wiht metabolic acidosis, elevated K+, hypertensive urgency (resolved) and suspected bladder outlet obstruction.  Noted hydronephrosis, atelectasis, duodenitis, diverticulosis.  PMHx:  atherosclerosis, HTN, CAD, breast CA, MI,     PT Comments    Pt received sitting up in recliner with head resting on tray table in front of her. Pt reporting she had asked to get back in bed a long time ago. Pt reporting pain but unable to specify where. Pt appeared to be more confused than previous notes per chart review. Pt required max multimodal cuing for initiation, sequencing, technique and redirection to task. Pt required min A to rise from recliner and transfer to bed with RW. Assist required for RW mgt, to steady and max cuing. Once repositioned in bed, pt performing LE and UE therex as noted below. Pt required frequent cuing for correct performance and often a physical target. Pt getting distracted easily during exercise. Then per OT coming in and nursing pt planning to discharge later today. Pt then required min guard to get back EOB with sig increased time and effort and use of bed rails. Pt min A to rise with RW and hands on min guard to ambulate to sink in room with RW. Once at sink, left pt with OT for rest of OT session. Pt presenting with dec strength, ROM, balance, activity tolerance and cognition and will continue to benefit from acute rehab. Continue to recommend STR, however with pt refusing recommend 24/7 assist from family/caregivers for improved safety and dec fall risk.   Follow Up Recommendations  CIR     Equipment Recommendations  None recommended by PT    Recommendations for Other Services       Precautions / Restrictions  Precautions Precautions: Fall Restrictions Weight Bearing Restrictions: No    Mobility  Bed Mobility Overal bed mobility: Needs Assistance Bed Mobility: Sit to Supine;Supine to Sit     Supine to sit: Min guard;HOB elevated Sit to supine: Min assist;HOB elevated   General bed mobility comments: min A for LE advancement returning to bed, min guard to get EOB with pt requiring sig increased time and effort, use of bed rails  Transfers Overall transfer level: Needs assistance Equipment used: Rolling walker (2 wheeled) Transfers: Sit to/from [facility] Sit to Stand: Min assist Stand pivot transfers: Min assist       General transfer comment: min A to rise from recliner and to complete stand pivot back to bed, then lighter min A to rise from bed, multimodal cuing required for technique and sequencing  Ambulation/Gait Ambulation/Gait assistance: Min guard Gait Distance (Feet): 12 Feet Assistive device: Rolling walker (2 wheeled) Gait Pattern/deviations: Step-through pattern;Decreased stride length;Trunk flexed;Shuffle Gait velocity: dec   General Gait Details: pt ambulated within room to sink with RW, close min guard for safety, no overt unsteadiness or LOB noted, pt very slow gait speed with short shuffle steps   Stairs             Wheelchair Mobility    Modified Rankin (Stroke Patients Only)       Balance Overall balance assessment: Needs assistance Sitting-balance support: Feet supported Sitting balance-Leahy Scale: Fair Sitting balance - Comments: overall steady sitting EOB   Standing balance support: Bilateral upper extremity supported;During functional activity  Standing balance-Leahy Scale: Fair Standing balance comment: reliant on RW for standing balance external support dynamically                            Cognition Arousal/Alertness: Awake/alert Behavior During Therapy: WFL for tasks assessed/performed Overall Cognitive  Status: No family/caregiver present to determine baseline cognitive functioning                                 General Comments: repetitive cuing for initiation and all tasks, slow processing      Exercises Total Joint Exercises Heel Slides: AROM;Both;10 reps Hip ABduction/ADduction: AROM;Both;10 reps Straight Leg Raises: AROM;Both;10 reps Other Exercises Other Exercises: ue therex including punches and bicep curls x15 each    General Comments        Pertinent Vitals/Pain Pain Assessment: Faces Faces Pain Scale: Hurts little more Pain Location: did not specify Pain Descriptors / Indicators: Sore;Discomfort Pain Intervention(s): Monitored during session    Home Living                      Prior Function            PT Goals (current goals can now be found in the care plan section) Progress towards PT goals: Progressing toward goals    Frequency    Min 2X/week      PT Plan Current plan remains appropriate    Co-evaluation              AM-PAC PT "6 Clicks" Mobility   Outcome Measure  Help needed turning from your back to your side while in a flat bed without using bedrails?: A Little Help needed moving from lying on your back to sitting on the side of a flat bed without using bedrails?: A Little Help needed moving to and from a bed to a chair (including a wheelchair)?: A Little Help needed standing up from a chair using your arms (e.g., wheelchair or bedside chair)?: A Little Help needed to walk in hospital room?: A Little Help needed climbing 3-5 steps with a railing? : A Lot 6 Click Score: 17    End of Session Equipment Utilized During Treatment: Gait belt Activity Tolerance: Patient tolerated treatment well Patient left: Other (comment)(at sink with OT for rest of OT session) Nurse Communication: Mobility status PT Visit Diagnosis: Unsteadiness on feet (R26.81);Muscle weakness (generalized) (M62.81);Ataxic gait (R26.0)      Time: [id number] PT Time Calculation (min) (ACUTE ONLY): 35 min  Charges:  $Therapeutic Exercise: 8-22 mins $Therapeutic Activity: 8-22 mins                     [name] PT, DPT 3:50 PM,06/15/19 [phone]    [name] 06/15/2019, 3:39 PM

## 2019-06-15 NOTE — TOC Progression Note (Signed)
Transition of Care [facility]) - Progression Note    Patient Details  Name: [name] [name] MRN: [id number] Date of Birth: 08/30/1940  [facility] CM/SW Contact  Shelbie Ammons, RN Phone Number: 06/15/2019, 11:40 AM  Clinical Narrative:   RNCM notified by Dr. Reesa Chew that patient has decided she wants to go home she does not want to go to facility. RNCM contacted Brittney with [facility] and they will be able to take patient for nursing, PT,OT, HHA and SW. All information provided.     Expected Discharge Plan: Skilled Nursing Facility Barriers to Discharge: Barriers Resolved  Expected Discharge Plan and Services Expected Discharge Plan: [place]   Discharge Planning Services: CM Consult   Living arrangements for the past 2 months: Single Family Home                                       Social Determinants of Health (SDOH) Interventions    Readmission Risk Interventions No flowsheet data found.

## 2019-06-15 NOTE — Progress Notes (Signed)
Inpatient Rehabilitation-Admissions Coordinator   Notified by [facility] this AM that pt is not wanting to consider CIR at this time. I spoke with pt via phone and she confirmed she would prefer a rehab closer to home.   AC will sign off. TOC aware.   Raechel Ache, OTR/L  Rehab Admissions Coordinator  [phone] 06/15/2019 10:41 AM

## 2019-06-15 NOTE — NC FL2 (Signed)
[place] LEVEL OF CARE SCREENING TOOL     IDENTIFICATION  Patient Name: [name] [name] Birthdate: 1940/06/10 Sex: female Admission Date (Current Location): 06/11/2019  [place] and [place] Number:  Engineering geologist and Address:  [facility], [address] [place], [place] [address]      Provider Number: [id number]  Attending Physician Name and Address:  Lorella Nimrod, MD  Relative Name and Phone Number:       Current Level of Care: Hospital Recommended Level of Care: [place] Prior Approval Number:    Date Approved/Denied:   PASRR Number: [id number]  Discharge Plan: SNF    Current Diagnoses: Patient Active Problem List   Diagnosis Date Noted  . History of breast cancer 06/12/2019  . Acute renal failure ([place]) 06/12/2019  . Generalized weakness 06/12/2019  . Hyperkalemia 06/12/2019  . Hypertensive urgency 06/12/2019  . Disorder of bursae of shoulder region 10/20/2018  . Lower GI bleeding 09/10/2018  . Hiatal hernia   . Melena   . Polyp of ascending colon   . Diverticulosis of large intestine without diverticulitis   . GIB (gastrointestinal bleeding) 09/05/2018  . LVH (left ventricular hypertrophy) due to hypertensive disease, without heart failure 04/10/2018  . Chest pain 03/31/2018  . Vasovagal syncope 03/20/2018  . Lichen planus [id number]  . Malignant neoplasm of upper-inner quadrant of right breast in female, estrogen receptor positive ([place]) 07/28/2016  . Gastric ulcer requiring drug therapy, chronic 01/23/2016  . MI (mitral incompetence) 07/22/2015  . Combined fat and carbohydrate induced hyperlipemia 12/26/2014  . UTI (lower urinary tract infection) 08/18/2014  . Blurry vision 08/18/2014  . Allergic rhinitis 07/12/2014  . Absolute anemia 07/12/2014  . Baker's cyst of knee 07/12/2014  . Atherosclerosis of coronary artery 07/12/2014  . CAFL (chronic airflow limitation) ([place]) 07/12/2014  .  Dizziness 07/12/2014  . Acid reflux 07/12/2014  . 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 History of colon polyps 07/12/2014  . Hypercholesteremia 07/12/2014  . Malaise and fatigue 07/12/2014  . Heart attack ([place]) 07/12/2014  . Muscle ache 07/12/2014  . Arthritis, degenerative 07/12/2014  . Bradycardia 08/23/2013  . CAD (coronary artery disease) 08/17/2013  . Diabetes ([place]) 08/17/2013  . Diabetes mellitus ([place]) 08/17/2013  . Peripheral vascular disease ([place]) 08/17/2013    Orientation RESPIRATION BLADDER Height & Weight     Time, Self, Place, Situation  Normal Indwelling catheter Weight: 60.4 kg Height:  5\' 2"  (157.5 cm)  BEHAVIORAL SYMPTOMS/MOOD NEUROLOGICAL BOWEL NUTRITION STATUS      Continent Diet(Heart Healthy, Carb Modified)  AMBULATORY STATUS COMMUNICATION OF NEEDS Skin   Extensive Assist Verbally Normal                       Personal Care Assistance Level of Assistance  Bathing, Feeding, Dressing Bathing Assistance: Maximum assistance Feeding assistance: Maximum assistance Dressing Assistance: Maximum assistance     Functional Limitations Info  Sight, Hearing, Speech Sight Info: Adequate Hearing Info: Adequate Speech Info: Adequate    SPECIAL CARE FACTORS FREQUENCY  PT (By licensed PT), OT (By licensed OT)                    Contractures Contractures Info: Not present    Additional Factors Info  Code Status, Allergies Code Status Info: DNR Allergies Info: Celebrex, PCN, Prevacid           Current Medications (06/15/2019):  This is the current hospital active medication list Current Facility-Administered Medications  Medication Dose Route Frequency Provider Last Rate Last Admin  . acetaminophen (TYLENOL) tablet 1,000 mg  1,000 mg Oral Q8H PRN Sharion Settler, NP   1,000 mg at 06/14/19 2309  . amLODipine (NORVASC) tablet 10 mg  10 mg Oral Daily Lorella Nimrod, MD   10 mg at 06/15/19 0940  . bisacodyl (DULCOLAX) EC tablet 5 mg  5 mg Oral Daily PRN  Sharion Settler, NP      . Chlorhexidine Gluconate Cloth 2 % PADS 6 each  6 each Topical Daily Stoioff, Ronda Fairly, MD   6 each at 06/14/19 0950  . heparin injection 5,000 Units  5,000 Units Subcutaneous Q8H Stoioff, Ronda Fairly, MD   5,000 Units at 06/15/19 0527  . labetalol (NORMODYNE) injection 10 mg  10 mg Intravenous Q2H PRN Lorella Nimrod, MD   10 mg at 06/14/19 2302  . lactated ringers infusion   Intravenous Continuous Lorella Nimrod, MD 100 mL/hr at 06/15/19 0945 New Bag at 06/15/19 0945  . magnesium hydroxide (MILK OF MAGNESIA) suspension 30 mL  30 mL Oral Daily PRN Sharion Settler, NP      . MEDLINE mouth rinse  15 mL Mouth Rinse BID Stoioff, Ronda Fairly, MD   15 mL at 06/14/19 2023  . ondansetron (ZOFRAN) tablet 4 mg  4 mg Oral Q6H PRN Stoioff, Scott C, MD       Or  . ondansetron (ZOFRAN) injection 4 mg  4 mg Intravenous Q6H PRN Stoioff, Scott C, MD   4 mg at 06/12/19 1329  . sodium phosphate (FLEET) 7-19 GM/118ML enema 1 enema  1 enema Rectal Once PRN Sharion Settler, NP         Discharge Medications: Please see discharge summary for a list of discharge medications.  Relevant Imaging Results:  Relevant Lab Results:   Additional Information SS# [id number]  Shelbie Ammons, RN

## 2019-06-18 ENCOUNTER — Emergency Department
Admission: EM | Admit: 2019-06-18 | Discharge: 2019-06-19 | Disposition: A | Discharge status: Home / Self Care | Payer: PPO | Attending: Emergency Medicine | Admitting: Emergency Medicine

## 2019-06-18 ENCOUNTER — Telehealth: Payer: Self-pay

## 2019-06-18 ENCOUNTER — Other Ambulatory Visit: Payer: Self-pay

## 2019-06-18 ENCOUNTER — Encounter: Payer: Self-pay | Admitting: Emergency Medicine

## 2019-06-18 DIAGNOSIS — R310 Gross hematuria: Secondary | ICD-10-CM | POA: Insufficient documentation

## 2019-06-18 DIAGNOSIS — I10 Essential (primary) hypertension: Secondary | ICD-10-CM | POA: Diagnosis not present

## 2019-06-18 DIAGNOSIS — Z87891 Personal history of nicotine dependence: Secondary | ICD-10-CM | POA: Insufficient documentation

## 2019-06-18 DIAGNOSIS — R319 Hematuria, unspecified: Secondary | ICD-10-CM | POA: Diagnosis present

## 2019-06-18 DIAGNOSIS — Z79899 Other long term (current) drug therapy: Secondary | ICD-10-CM | POA: Diagnosis not present

## 2019-06-18 DIAGNOSIS — Z978 Presence of other specified devices: Secondary | ICD-10-CM | POA: Insufficient documentation

## 2019-06-18 DIAGNOSIS — I2510 Atherosclerotic heart disease of native coronary artery without angina pectoris: Secondary | ICD-10-CM | POA: Insufficient documentation

## 2019-06-18 LAB — URINALYSIS, COMPLETE (UACMP) WITH MICROSCOPIC
Bilirubin Urine: NEGATIVE
Glucose, UA: NEGATIVE mg/dL
Ketones, ur: NEGATIVE mg/dL
Nitrite: NEGATIVE
Protein, ur: 100 mg/dL — AB
RBC / HPF: >50 RBC/hpf — ABNORMAL HIGH (ref 0–5)
Specific Gravity, Urine: 1.024 (ref 1.005–1.030)
WBC, UA: >50 WBC/hpf — ABNORMAL HIGH (ref 0–5)
pH: 5 (ref 5.0–8.0)

## 2019-06-18 NOTE — ED Triage Notes (Signed)
Patient was here for bllod in urine bag.  Says they got it cleared bjut now it is bloody again.

## 2019-06-18 NOTE — ED Notes (Signed)
Pt reports blood tinged urine in bag. Denies pain. States she was just discharged from hospital this week. Pt has indwelling foley cath in place. No signs of obstruction. Dark brown, possible blood tinged urine is noted.

## 2019-06-18 NOTE — ED Provider Notes (Signed)
[facility] Emergency Department Provider Note  ____________________________________________  Time seen: Approximately 8:45 PM  I have reviewed the triage vital signs and the nursing notes.   HISTORY  Chief Complaint No chief complaint on file.    HPI [name] [name] is a 78 y.o. female who presents the emergency department complaining of hematuria.  Patient was recently admitted to the hospital for weakness.  It was found that patient had obstructive uropathy with urethral structure.  Patient was taken to the OR with cystoscopy and urethral dilation.  Patient had a Foley established with instructions to follow-up with urology.  Patient states that she has been emptying her Foley and has had clear urine until today.  This morning urine was normal appearance.  This afternoon she had gross hematuria with clots.  Patient emptied her back and as it refilled she also noticed ongoing hematuria.  Patient denies any fevers or chills, chest pain, shortness of breath, abdominal pain, flank pain.  Patient presents to the emergency department for evaluation of visualized blood in her Foley catheter.         Past Medical History:  Diagnosis Date  . Breast cancer ([place]) 2018   Right breast  . 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 Mucin noted on biopsy, not on wide excision.  . Coronary artery disease   . GERD (gastroesophageal reflux disease)   . Hyperlipidemia   . Hypertension   . 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 Dr. Kellie Moor DX by punch bioppsy  . Lichen planus    right breast   . Lichen planus   . MI (myocardial infarction) ([place])    2015  . Personal history of radiation therapy 09/2016   RIGHT lumpectomy w/ radiation    Patient Active Problem List   Diagnosis Date Noted  . History of breast cancer 06/12/2019  . Acute renal failure ([place]) 06/12/2019  . Generalized  weakness 06/12/2019  . Hyperkalemia 06/12/2019  . Hypertensive urgency 06/12/2019  . Disorder of bursae of shoulder region 10/20/2018  . Lower GI bleeding 09/10/2018  . Hiatal hernia   . Melena   . Polyp of ascending colon   . Diverticulosis of large intestine without diverticulitis   . GIB (gastrointestinal bleeding) 09/05/2018  . LVH (left ventricular hypertrophy) due to hypertensive disease, without heart failure 04/10/2018  . Chest pain 03/31/2018  . Vasovagal syncope 03/20/2018  . Lichen planus [id number]  . Malignant neoplasm of upper-inner quadrant of right breast in female, estrogen receptor positive ([place]) 07/28/2016  . Gastric ulcer requiring drug therapy, chronic 01/23/2016  . MI (mitral incompetence) 07/22/2015  . Combined fat and carbohydrate induced hyperlipemia 12/26/2014  . UTI (lower urinary tract infection) 08/18/2014  . Blurry vision 08/18/2014  . Allergic rhinitis 07/12/2014  . Absolute anemia 07/12/2014  . Baker's cyst of knee 07/12/2014  . Atherosclerosis of coronary artery 07/12/2014  . CAFL (chronic airflow limitation) ([place]) 07/12/2014  . Dizziness 07/12/2014  . Acid reflux 07/12/2014  . 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 History of colon polyps 07/12/2014  . Hypercholesteremia 07/12/2014  . Malaise and fatigue 07/12/2014  . Heart attack ([place]) 07/12/2014  . Muscle ache 07/12/2014  . Arthritis, degenerative 07/12/2014  . Bradycardia 08/23/2013  . CAD (coronary artery disease) 08/17/2013  . Diabetes ([place]) 08/17/2013  . Diabetes mellitus ([place]) 08/17/2013  . Peripheral vascular disease ([place]) 08/17/2013    Past Surgical History:  Procedure  Laterality Date  . ABDOMINAL HYSTERECTOMY    . APPENDECTOMY    . BREAST BIOPSY Right 07/23/2016   INVASIVE MAMMARY CARCINOMA WITH AREAS OF EXTRACELLULAR MUCIN  . BREAST EXCISIONAL BIOPSY     INVASIVE MUCINOUS MAMMARY CARCINOMA.   Marland Kitchen BREAST LUMPECTOMY Right 08/20/2016   INVASIVE MUCINOUS MAMMARY CARCINOMA. /Grade 2    . COLONOSCOPY  2016  . COLONOSCOPY N/A 09/06/2018   Procedure: COLONOSCOPY;  Surgeon: Virgel Manifold, MD;  Location: [facility] ENDOSCOPY;  Service: Endoscopy;  Laterality: N/A;  . CORONARY ANGIOPLASTY WITH STENT PLACEMENT    . CYSTOSCOPY    . CYSTOSCOPY WITH URETHRAL DILATATION N/A 06/12/2019   Procedure: CYSTOSCOPY WITH URETHRAL DILATATION;  Surgeon: Abbie Sons, MD;  Location: [facility] ORS;  Service: Urology;  Laterality: N/A;  . ESOPHAGOGASTRODUODENOSCOPY N/A 09/06/2018   Procedure: ESOPHAGOGASTRODUODENOSCOPY (EGD);  Surgeon: Virgel Manifold, MD;  Location[facility] ENDOSCOPY;  Service: Endoscopy;  Laterality: N/A;  . OOPHORECTOMY    . PARTIAL MASTECTOMY WITH AXILLARY SENTINEL LYMPH NODE BIOPSY Right 08/09/2016   Procedure: PARTIAL MASTECTOMY WITH AXILLARY SENTINEL LYMPH NODE BIOPSY;  Surgeon: Robert Bellow, MD;  Location: [facility] ORS;  Service: General;  Laterality: Right;  . TONSILLECTOMY      Prior to Admission medications   Medication Sig Start Date End Date Taking? Authorizing Provider  Calcium Carb-Cholecalciferol (CALCIUM 500+D PO) Take by mouth every other day.    [provider]  lisinopril (ZESTRIL) 20 MG tablet Take 20 mg by mouth daily. 05/29/19   [provider]  omeprazole (PRILOSEC) 20 MG capsule TAKE 1 CAPSULE (20 MG TOTAL) BY MOUTH DAILY AS NEEDED (INDIGESTION). 06/12/19   Jerrol Banana., MD    Allergies Celebrex  [celecoxib], Penicillins, and Prevacid [lansoprazole]  Family History  Problem Relation Age of Onset  . Hyperlipidemia Mother   . Allergies Mother   . Cerebral aneurysm Mother        cause of death at age 58  . Heart disease Father        Fatal MI ag 110  . Cancer Brother        lung cancer  . Hyperlipidemia Brother   . Colonic polyp Brother   . Healthy Son   . Cancer - Lung Brother        colon  . Healthy Son   . Breast cancer Neg Hx     Social History Social History   Tobacco Use  . Smoking status: Former Smoker     Packs/day: 0.25    Years: 10.00    Pack years: 2.50    Types: Cigarettes    Quit date: 03/06/1998    Years since quitting: 21.3  . Smokeless tobacco: Never Used  Substance Use Topics  . Alcohol use: No  . Drug use: No     Review of Systems  Constitutional: No fever/chills Eyes: No visual changes. No discharge ENT: No upper respiratory complaints. Cardiovascular: no chest pain. Respiratory: no cough. No SOB. Gastrointestinal: No abdominal pain.  No nausea, no vomiting.  No diarrhea.  No constipation. Genitourinary: Negative for dysuria.  Indwelling urinary catheter with gross hematuria Musculoskeletal: Negative for musculoskeletal pain. Skin: Negative for rash, abrasions, lacerations, ecchymosis. Neurological: Negative for headaches, focal weakness or numbness. 10-point ROS otherwise negative.  ____________________________________________   PHYSICAL EXAM:  VITAL SIGNS: ED Triage Vitals  Enc Vitals Group     BP 06/18/19 1824 133/69     Pulse Rate 06/18/19 1824 80     Resp 06/18/19  1824 18     Temp 06/18/19 1824 97.6 F (36.4 C)     Temp Source 06/18/19 1824 Oral     SpO2 06/18/19 1824 100 %     Weight 06/18/19 1825 133 lb (60.3 kg)     Height 06/18/19 1825 5\' 2"  (1.575 m)     Head Circumference --      Peak Flow --      Pain Score 06/18/19 1839 0     Pain Loc --      Pain Edu? --      Excl. in [place]? --      Constitutional: Alert and oriented. Well appearing and in no acute distress. Eyes: Conjunctivae are normal. PERRL. EOMI. Head: Atraumatic. ENT:      Ears:       Nose: No congestion/rhinnorhea.      Mouth/Throat: Mucous membranes are moist.  Neck: No stridor.    Cardiovascular: Normal rate, regular rhythm. Normal S1 and S2.  Good peripheral circulation. Respiratory: Normal respiratory effort without tachypnea or retractions. Lungs CTAB. Good air entry to the bases with no decreased or absent breath sounds. Gastrointestinal: Bowel sounds 4 quadrants. Soft  and nontender to palpation. No guarding or rigidity. No palpable masses. No distention. No CVA tenderness. Musculoskeletal: Full range of motion to all extremities. No gross deformities appreciated. Neurologic:  Normal speech and language. No gross focal neurologic deficits are appreciated.  Skin:  Skin is warm, dry and intact. No rash noted. Psychiatric: Mood and affect are normal. Speech and behavior are normal. Patient exhibits appropriate insight and judgement.   ____________________________________________   LABS (all labs ordered are listed, but only abnormal results are displayed)  Labs Reviewed  URINALYSIS, COMPLETE (UACMP) WITH MICROSCOPIC - Abnormal; Notable for the following components:      Result Value   Color, Urine YELLOW (*)    APPearance CLOUDY (*)    Hgb urine dipstick LARGE (*)    Protein, ur 100 (*)    Leukocytes,Ua MODERATE (*)    RBC / HPF >50 (*)    WBC, UA >50 (*)    Bacteria, UA FEW (*)    All other components within normal limits   ____________________________________________  EKG   ____________________________________________  RADIOLOGY   No results found.  ____________________________________________    PROCEDURES  Procedure(s) performed:    Procedures    Medications - No data to display   ____________________________________________   INITIAL IMPRESSION / ASSESSMENT AND PLAN / ED COURSE  Pertinent labs & imaging results that were available during my care of the patient were reviewed by me and considered in my medical decision making (see chart for details).  Review of the [place] CSRS was performed in accordance of the [place] prior to dispensing any controlled drugs.  Clinical Course as of Jun 19 26  Mon Jun 18, 2019  2238 Patient presented to emergency department gross hematuria in her Foley catheter.  She was recently admitted to the hospital for generalized weakness and was found to have obstructive uropathy with urethral  stricture.  Patient has cystoscopy with urethral dilation.  She has an indwelling catheter.  She was discharged 3 days ago from the hospital.  She has been emptying her bag 3 times a day and up to today had no difficulties.  Today she started to notice gross hematuria this afternoon when she was emptying her bag.  As urine filled the back throughout the evening she noticed return of gross hematuria with clots.  Johney Maine  hematuria was appreciated in the back initially.  Patient's catheter was flushed and had a few small visualized clots but urine at this time was yellow and clear.  Initial urinalysis tonight showed large amount of hemoglobin, moderate leukocytes with a few bacteria.  Unfortunately I was unable to find another recent urinalysis even though the patient had been admitted.  At this time I will consult urology for recommendations for management.   [JC]    Clinical Course User Index [JC] Syanna Remmert, Charline Bills, PA-C          Patient's diagnosis is consistent with hematuria, indwelling Foley catheter.  Patient presented to the emergency department with concerns for gross hematuria and her catheter.  Patient had urinary outlet obstruction with urethral dilation performed as she was an inpatient last week.  Patient to be discharged, had had clear urine for the past 3 days.  Today patient noticed hematuria.  Patient presented to the emergency department with no other complaints other than for evaluation of the gross hematuria.  Foley was flushed, this returned with a few mild clots but urine was clear.  At this time I discussed the patient with urology.  At this time they do not feel antibiotics or further treatment is necessary.  Patient is stable.  I discussed likely cause of hematuria with the patient.  Return precautions are discussed at length with the patient.  She already has an appointment to see urology this week..  Patient is given ED precautions to return to the ED for any worsening or new  symptoms.     ____________________________________________  FINAL CLINICAL IMPRESSION(S) / ED DIAGNOSES  Final diagnoses:  Gross hematuria  Indwelling Foley catheter present      NEW MEDICATIONS STARTED DURING THIS VISIT:  ED Discharge Orders    None          This chart was dictated using voice recognition software/Dragon. Despite best efforts to proofread, errors can occur which can change the meaning. Any change was purely unintentional.    Darletta Moll, PA-C 06/19/19 Marlowe Aschoff, MD 06/19/19 1501

## 2019-06-18 NOTE — Telephone Encounter (Signed)
Copied from [place] [phone]. Topic: Appointment Scheduling - Scheduling Inquiry for Clinic >> Jun 18, 2019  9:16 AM [name] [name] wrote: Patient wants to know is there anyway she can be seen sooner than 4/27 for hosp follow up with Dr Rosanna Randy

## 2019-06-18 NOTE — Telephone Encounter (Signed)
HFU not scheduled however a 3 month f/u is scheduled for 07/03/19.

## 2019-06-19 NOTE — Telephone Encounter (Signed)
Called to try to schedule patient for hospital follow up for next week. If patient returns call it is okay for [facility] to schedule.

## 2019-06-19 NOTE — Telephone Encounter (Signed)
I have made the 2nd attempt to contact the patient or family member in charge, in order to follow up from recently being discharged from the hospital. I left a message on voicemail requesting a CB. -MM  

## 2019-06-20 NOTE — Telephone Encounter (Signed)
I have tried to contact this pt on two separate occassions and left a VM requesting a CB. Pt has not returned my calls or VM. HFU scheduled for 06/27/19 @ 11:20 AM. Juluis Rainier to PCP. -MM

## 2019-06-22 ENCOUNTER — Telehealth: Payer: Self-pay | Admitting: Family Medicine

## 2019-06-22 DIAGNOSIS — I10 Essential (primary) hypertension: Secondary | ICD-10-CM | POA: Diagnosis not present

## 2019-06-22 DIAGNOSIS — I252 Old myocardial infarction: Secondary | ICD-10-CM | POA: Diagnosis not present

## 2019-06-22 DIAGNOSIS — Z86010 Personal history of colonic polyps: Secondary | ICD-10-CM | POA: Diagnosis not present

## 2019-06-22 DIAGNOSIS — I2510 Atherosclerotic heart disease of native coronary artery without angina pectoris: Secondary | ICD-10-CM | POA: Diagnosis not present

## 2019-06-22 DIAGNOSIS — M7120 Synovial cyst of popliteal space [Baker], unspecified knee: Secondary | ICD-10-CM | POA: Diagnosis not present

## 2019-06-22 DIAGNOSIS — K219 Gastro-esophageal reflux disease without esophagitis: Secondary | ICD-10-CM | POA: Diagnosis not present

## 2019-06-22 DIAGNOSIS — J309 Allergic rhinitis, unspecified: Secondary | ICD-10-CM | POA: Diagnosis not present

## 2019-06-22 DIAGNOSIS — I160 Hypertensive urgency: Secondary | ICD-10-CM | POA: Diagnosis not present

## 2019-06-22 DIAGNOSIS — Z87440 Personal history of urinary (tract) infections: Secondary | ICD-10-CM | POA: Diagnosis not present

## 2019-06-22 DIAGNOSIS — M1990 Unspecified osteoarthritis, unspecified site: Secondary | ICD-10-CM | POA: Diagnosis not present

## 2019-06-22 DIAGNOSIS — E1151 Type 2 diabetes mellitus with diabetic peripheral angiopathy without gangrene: Secondary | ICD-10-CM | POA: Diagnosis not present

## 2019-06-22 DIAGNOSIS — K5730 Diverticulosis of large intestine without perforation or abscess without bleeding: Secondary | ICD-10-CM | POA: Diagnosis not present

## 2019-06-22 DIAGNOSIS — Z9181 History of falling: Secondary | ICD-10-CM | POA: Diagnosis not present

## 2019-06-22 DIAGNOSIS — R32 Unspecified urinary incontinence: Secondary | ICD-10-CM | POA: Diagnosis not present

## 2019-06-22 DIAGNOSIS — Z87891 Personal history of nicotine dependence: Secondary | ICD-10-CM | POA: Diagnosis not present

## 2019-06-22 DIAGNOSIS — Z853 Personal history of malignant neoplasm of breast: Secondary | ICD-10-CM | POA: Diagnosis not present

## 2019-06-22 DIAGNOSIS — K449 Diaphragmatic hernia without obstruction or gangrene: Secondary | ICD-10-CM | POA: Diagnosis not present

## 2019-06-22 DIAGNOSIS — E875 Hyperkalemia: Secondary | ICD-10-CM | POA: Diagnosis not present

## 2019-06-22 DIAGNOSIS — E785 Hyperlipidemia, unspecified: Secondary | ICD-10-CM | POA: Diagnosis not present

## 2019-06-22 NOTE — Telephone Encounter (Signed)
Soni with [facility] calling to request VO for PT.    Frequency:  1w1 2w3 1w2  [phone]

## 2019-06-22 NOTE — Telephone Encounter (Signed)
Verbal okay given.  

## 2019-06-23 DIAGNOSIS — R310 Gross hematuria: Secondary | ICD-10-CM | POA: Diagnosis not present

## 2019-06-23 NOTE — Telephone Encounter (Signed)
ok 

## 2019-06-25 ENCOUNTER — Ambulatory Visit: Payer: Self-pay | Admitting: Physician Assistant

## 2019-06-25 ENCOUNTER — Other Ambulatory Visit: Payer: Self-pay

## 2019-06-25 ENCOUNTER — Ambulatory Visit (INDEPENDENT_AMBULATORY_CARE_PROVIDER_SITE_OTHER): Payer: PPO | Admitting: Physician Assistant

## 2019-06-25 DIAGNOSIS — N179 Acute kidney failure, unspecified: Secondary | ICD-10-CM | POA: Diagnosis not present

## 2019-06-25 DIAGNOSIS — N3592 Unspecified urethral stricture, female: Secondary | ICD-10-CM | POA: Diagnosis not present

## 2019-06-25 LAB — BLADDER SCAN AMB NON-IMAGING: Scan Result: 231

## 2019-06-25 NOTE — Patient Instructions (Addendum)
Today we discussed your two current problems: 1. Urethral stricture (narrowing of the tube that you urinate out of).  This is the problem that you had surgery to repair earlier this month.  The surgery was successful, however urethral strictures tend to come back.  I recommend that you start catheterizing yourself to keep your urethra open and reduce the risk of your stricture coming back. 2. Incomplete bladder emptying.  Today you report not being able to urinate.  I want you to continue trying to urinate overnight and come back to clinic tomorrow.  At that point, we will scan your bladder again and see how much urine is inside it.  I am concerned that if you continue to be unable to urinate, urine may back up into your kidneys and damage them again.  Eventually, damage to your kidneys will become permanent and could cause kidney failure.  This will be life-threatening.  If you are not able to urinate before visit tomorrow, I will recommend that you start catheterizing yourself to empty your bladder or that we replace a Foley catheter and that you keep it in all the time.

## 2019-06-25 NOTE — Progress Notes (Signed)
Fill and Pull Catheter Removal  Patient is present today for a catheter removal.  Patient was cleaned and prepped in a sterile fashion 127ml of sterile saline was instilled into the bladder when the patient felt the urge to urinate. 22ml of water was then drained from the balloon.  A 20FR foley cath was removed from the bladder no complications were noted .  Patient was then given some time to void on their own.  Patient cannot void on their own after some time.  Patient tolerated well.  Performed by: Bradly Bienenstock, CMA  Follow up/ Additional notes: Push fluids and RTC this afternoon for PVR.

## 2019-06-25 NOTE — Progress Notes (Signed)
06/25/2019 5:10 PM   Cybill B Ciszek [birth year] [id number]  CC: Hospital follow-up with postop voiding trial  HPI: Aryann Levier Kassing is a 79 y.o. 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  She was admitted from 06/11/2019 to 06/15/2019 with acute on chronic urinary retention and acute renal failure.  She underwent cystoscopy with urethral dilation with Dr. Bernardo Heater on 06/12/2019.  Foley placed intraoperatively.  Today, patient reports discomfort around her Foley catheter that has interfered with sleep.  As previously noted, she has a history of chronic urinary retention with elevated PVRs around 300 mL.  She underwent urethral stricture dilation in 2016 and 2017.  She also has a history of microscopic hematuria and E. coli UTIs.  She was previously managed by Dr. Erlene Quan but was lost to follow-up in 2017.  PMH: Past Medical History:  Diagnosis Date  . Breast cancer ([place]) 2018   Right breast  . 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 Mucin noted on biopsy, not on wide excision.  . Coronary artery disease   . GERD (gastroesophageal reflux disease)   . Hyperlipidemia   . Hypertension   . 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 Dr. Kellie Moor DX by punch bioppsy  . Lichen planus    right breast   . Lichen planus   . MI (myocardial infarction) ([place])    2015  . Personal history of radiation therapy 09/2016   RIGHT lumpectomy w/ radiation    Surgical History: Past Surgical History:  Procedure Laterality Date  . ABDOMINAL HYSTERECTOMY    . APPENDECTOMY    . BREAST BIOPSY Right 07/23/2016   INVASIVE MAMMARY CARCINOMA WITH AREAS OF EXTRACELLULAR MUCIN  . BREAST EXCISIONAL BIOPSY     INVASIVE MUCINOUS MAMMARY CARCINOMA.   Marland Kitchen BREAST LUMPECTOMY Right 08/20/2016   INVASIVE MUCINOUS MAMMARY CARCINOMA.  /Grade 2   . COLONOSCOPY  2016  . COLONOSCOPY N/A 09/06/2018   Procedure: COLONOSCOPY;  Surgeon: Virgel Manifold, MD;  Location: [facility] ENDOSCOPY;  Service: Endoscopy;  Laterality: N/A;  . CORONARY ANGIOPLASTY WITH STENT PLACEMENT    . CYSTOSCOPY    . CYSTOSCOPY WITH URETHRAL DILATATION N/A 06/12/2019   Procedure: CYSTOSCOPY WITH URETHRAL DILATATION;  Surgeon: Abbie Sons, MD;  Location: [facility] ORS;  Service: Urology;  Laterality: N/A;  . ESOPHAGOGASTRODUODENOSCOPY N/A 09/06/2018   Procedure: ESOPHAGOGASTRODUODENOSCOPY (EGD);  Surgeon: Virgel Manifold, MD;  Location: [facility] ENDOSCOPY;  Service: Endoscopy;  Laterality: N/A;  . OOPHORECTOMY    . PARTIAL MASTECTOMY WITH AXILLARY SENTINEL LYMPH NODE BIOPSY Right 08/09/2016   Procedure: PARTIAL MASTECTOMY WITH AXILLARY SENTINEL LYMPH NODE BIOPSY;  Surgeon: Robert Bellow, MD;  Location: [facility] ORS;  Service: General;  Laterality: Right;  . TONSILLECTOMY      Home Medications:  Allergies as of 06/25/2019      Reactions   Celebrex  [celecoxib]    Dark stools.   Penicillins Other (See Comments)   Childhood reaction Has patient had a PCN reaction causing immediate rash, facial/tongue/throat swelling, SOB or lightheadedness with hypotension: Unknown Has patient had a PCN reaction causing severe rash involving mucus membranes or skin necrosis: Unknown Has patient had a PCN reaction that required hospitalization: Unknown Has patient had a PCN reaction occurring within the last 10 years: Unknown If all of the above answers are "NO", then may proceed with Cephalosporin  use.   Prevacid [lansoprazole] Diarrhea      Medication List       Accurate as of June 25, 2019  5:10 PM. If you have any questions, ask your nurse or doctor.        CALCIUM 500+D PO Take by mouth every other day.   lisinopril 20 MG tablet Commonly known as: ZESTRIL Take 20 mg by mouth daily.   omeprazole 20 MG capsule Commonly known as: PRILOSEC TAKE 1 CAPSULE (20  MG TOTAL) BY MOUTH DAILY AS NEEDED (INDIGESTION).       Allergies:  Allergies  Allergen Reactions  . Celebrex  [Celecoxib]     Dark stools.  . Penicillins Other (See Comments)    Childhood reaction Has patient had a PCN reaction causing immediate rash, facial/tongue/throat swelling, SOB or lightheadedness with hypotension: Unknown Has patient had a PCN reaction causing severe rash involving mucus membranes or skin necrosis: Unknown Has patient had a PCN reaction that required hospitalization: Unknown Has patient had a PCN reaction occurring within the last 10 years: Unknown If all of the above answers are "NO", then may proceed with Cephalosporin use.   Marland Kitchen Prevacid [Lansoprazole] Diarrhea    Family History: Family History  Problem Relation Age of Onset  . Hyperlipidemia Mother   . Allergies Mother   . Cerebral aneurysm Mother        cause of death at age 19  . Heart disease Father        Fatal MI ag 26  . Cancer Brother        lung cancer  . Hyperlipidemia Brother   . Colonic polyp Brother   . Healthy Son   . Cancer - Lung Brother        colon  . Healthy Son   . Breast cancer Neg Hx     Social History:   reports that she quit smoking about 21 years ago. Her smoking use included cigarettes. She has a 2.50 pack-year smoking history. She has never used smokeless tobacco. She reports that she does not drink alcohol or use drugs.  Physical Exam: There were no vitals taken for this visit.  Constitutional:  Alert and oriented, no acute distress, nontoxic appearing HEENT: [place], AT Cardiovascular: No clubbing, cyanosis, or edema Respiratory: Normal respiratory effort, no increased work of breathing Skin: No rashes, bruises or suspicious lesions Neurologic: Grossly intact, no focal deficits, moving all 4 extremities Psychiatric: Normal mood and affect  Laboratory Data: Results for orders placed or performed in visit on 06/25/19  Bladder Scan (Post Void Residual) in office    Result Value Ref Range   Scan Result 231    Assessment & Plan:   1. Stricture of female urethra, unspecified stricture type 79 year old female with a history of recurrent urethral stricture and elevated PVR around 300 mL, recently hospitalized with acute on chronic urinary retention with acute renal failure now s/p cystoscopy and urethral dilation.  We proceeded with postoperative voiding trial in clinic today.  See separate procedure note for details.  Patient return to clinic this afternoon.  She reports drinking approximately 24 ounces of fluid throughout the day.  She has been unable to urinate.  PVR 231 mL.  I am concerned for recurrent urinary retention in this patient with a recent history of acute renal failure.  I offered the patient CIC teaching versus Foley catheter replacement in clinic today.  She stated she would prefer to discuss with her husband before electing to proceed.  As her current PVR is less than her known baseline, I am comfortable with having her return to clinic tomorrow for repeat PVR.  I had a lengthy conversation with the patient today regarding the risks of recurrent urinary retention.  I explained that recurrent retention increases her risk of irreversible kidney damage, which would lead to renal failure.  I explained that the best way for her to keep an empty bladder and reduce the risk of urethral stricture recurrence is self-catheterization.  Will plan to discuss this again with her tomorrow. - Bladder Scan (Post Void Residual) in office   Return in about 1 day (around 06/26/2019) for PVR.  Debroah Loop, PA-C  [facility] [address] [place], [place] [address] [phone]

## 2019-06-25 NOTE — Progress Notes (Deleted)
    Established patient visit    Patient: [name] [name]   DOB: [birth year]   79 y.o. Female  MRN: [id number] Visit Date: 06/25/2019  Today's healthcare provider: Wilhemena Durie, MD   No chief complaint on file.  Subjective    HPI Follow up Hospitalization  Patient was admitted to *** on *** and discharged on ***. She was treated for ***. Treatment for this included ***. Telephone follow up was done on *** She reports {excellent/good/fair:19992} compliance with treatment. She reports this condition is {resolved/improved/worsened:23923}.  ----------------------------------------------------------------------------------------- -   {Show patient history (optional):23778::" "}   Medications: Outpatient Medications Prior to Visit  Medication Sig  . Calcium Carb-Cholecalciferol (CALCIUM 500+D PO) Take by mouth every other day.  . lisinopril (ZESTRIL) 20 MG tablet Take 20 mg by mouth daily.  Marland Kitchen omeprazole (PRILOSEC) 20 MG capsule TAKE 1 CAPSULE (20 MG TOTAL) BY MOUTH DAILY AS NEEDED (INDIGESTION).   No facility-administered medications prior to visit.    Review of Systems  {Show previous labs (optional):23779::" "}   Objective    There were no vitals taken for this visit. {Show previous vital signs (optional):23777::" "}  Physical Exam  ***  No results found for any visits on 06/27/19.   Assessment & Plan    ***  No follow-ups on file.      {provider attestation***:1}   Wilhemena Durie, MD  [facility] [phone] (phone) [phone] (fax)  [place]

## 2019-06-26 ENCOUNTER — Ambulatory Visit (INDEPENDENT_AMBULATORY_CARE_PROVIDER_SITE_OTHER): Payer: PPO | Admitting: Physician Assistant

## 2019-06-26 ENCOUNTER — Ambulatory Visit: Payer: Self-pay | Admitting: Physician Assistant

## 2019-06-26 VITALS — BP 115/69 | HR 90 | Ht 63.0 in | Wt 133.0 lb

## 2019-06-26 DIAGNOSIS — N3592 Unspecified urethral stricture, female: Secondary | ICD-10-CM | POA: Diagnosis not present

## 2019-06-26 DIAGNOSIS — R339 Retention of urine, unspecified: Secondary | ICD-10-CM

## 2019-06-26 LAB — BLADDER SCAN AMB NON-IMAGING
Scan Result: 645
Scan Result: 800

## 2019-06-26 NOTE — Patient Instructions (Signed)
Indwelling Urinary Catheter Care, Adult An indwelling urinary catheter is a thin, flexible, germ-free (sterile) tube that is placed into the bladder to help drain urine out of the body. The catheter is inserted into the part of the body that drains urine from the bladder (urethra). Urine drains from the catheter into a drainage bag outside of the body. Taking good care of your catheter will keep it working properly and help to prevent problems from developing. What are the risks?  Bacteria may get into your bladder and cause a urinary tract infection.  Urine flow can become blocked. This can happen if the catheter is not working correctly, or if you have sediment or a blood clot in your bladder or the catheter.  Tissue near the catheter may become irritated and bleed. How to wear your catheter and your drainage bag Supplies needed  Adhesive tape or a leg strap.  Alcohol wipe or soap and water (if you use tape).  A clean towel (if you use tape).  Overnight drainage bag.  Smaller drainage bag (leg bag). Wearing your catheter and bag Use adhesive tape or a leg strap to attach your catheter to your leg.  Make sure the catheter is not pulled tight.  If a leg strap gets wet, replace it with a dry one.  If you use adhesive tape: 1. Use an alcohol wipe or soap and water to wash off any stickiness on your skin where you had tape before. 2. Use a clean towel to pat-dry the area. 3. Apply the new tape. You should have received a large overnight drainage bag and a smaller leg bag that fits underneath clothing.  You may wear the overnight bag at any time, but you should not wear the leg bag at night.  Always wear the leg bag below your knee.  Make sure the overnight drainage bag is always lower than the level of your bladder, but do not let it touch the floor. Before you go to sleep, hang the bag inside a wastebasket that is covered by a clean plastic bag. How to care for your skin around  the catheter     Supplies needed  A clean washcloth.  Water and mild soap.  A clean towel. Caring for your skin and catheter  Every day, use a clean washcloth and soapy water to clean the skin around your catheter. 1. Wash your hands with soap and water. 2. Wet a washcloth in warm water and mild soap. 3. Clean the skin around your urethra.  If you are female:  Use one hand to gently spread the folds of skin around your vagina (labia).  With the washcloth in your other hand, wipe the inner side of your labia on each side. Do this in a front-to-back direction.  If you are female:  Use one hand to pull back any skin that covers the end of your penis (foreskin).  With the washcloth in your other hand, wipe your penis in small circles. Start wiping at the tip of your penis, then move outward from the catheter.  Move the foreskin back in place, if this applies. 4. With your free hand, hold the catheter close to where it enters your body. Keep holding the catheter during cleaning so it does not get pulled out. 5. Use your other hand to clean the catheter with the washcloth.  Only wipe downward on the catheter.  Do not wipe upward toward your body, because that may push bacteria into your urethra   and cause infection. 6. Use a clean towel to pat-dry the catheter and the skin around it. Make sure to wipe off all soap. 7. Wash your hands with soap and water.  Shower every day. Do not take baths.  Do not use cream, ointment, or lotion on the area where the catheter enters your body, unless your health care provider tells you to do that.  Do not use powders, sprays, or lotions on your genital area.  Check your skin around the catheter every day for signs of infection. Check for: ? Redness, swelling, or pain. ? Fluid or blood. ? Warmth. ? Pus or a bad smell. How to empty the drainage bag Supplies needed  Rubbing alcohol.  Gauze pad or cotton ball.  Adhesive tape or a leg  strap. Emptying the bag Empty your drainage bag (your overnight drainage bag or your leg bag) when it is ?- full, or at least 2-3 times a day. Clean the drainage bag according to the manufacturer's instructions or as told by your health care provider. 1. Wash your hands with soap and water. 2. Detach the drainage bag from your leg. 3. Hold the drainage bag over the toilet or a clean container. Make sure the drainage bag is lower than your hips and bladder. This stops urine from going back into the tubing and into your bladder. 4. Open the pour spout at the bottom of the bag. 5. Empty the urine into the toilet or container. Do not let the pour spout touch any surface. This precaution is important to prevent bacteria from getting in the bag and causing infection. 6. Apply rubbing alcohol to a gauze pad or cotton ball. 7. Use the gauze pad or cotton ball to clean the pour spout. 8. Close the pour spout. 9. Attach the bag to your leg with adhesive tape or a leg strap. 10. Wash your hands with soap and water. How to change the drainage bag Supplies needed:  Alcohol wipes.  A clean drainage bag.  Adhesive tape or a leg strap. Changing the bag Replace your drainage bag with a clean bag if it leaks, starts to smell bad, or looks dirty. 1. Wash your hands with soap and water. 2. Detach the dirty drainage bag from your leg. 3. Pinch the catheter with your fingers so that urine does not spill out. 4. Disconnect the catheter tube from the drainage tube at the connection valve. Do not let the tubes touch any surface. 5. Clean the end of the catheter tube with an alcohol wipe. Use a different alcohol wipe to clean the end of the drainage tube. 6. Connect the catheter tube to the drainage tube of the clean bag. 7. Attach the clean bag to your leg with adhesive tape or a leg strap. Avoid attaching the new bag too tightly. 8. Wash your hands with soap and water. General instructions   Never pull on  your catheter or try to remove it. Pulling can damage your internal tissues.  Always wash your hands before and after you handle your catheter or drainage bag. Use a mild, fragrance-free soap. If soap and water are not available, use hand sanitizer.  Always make sure there are no twists or bends (kinks) in the catheter tube.  Always make sure there are no leaks in the catheter or drainage bag.  Drink enough fluid to keep your urine pale yellow.  Do not take baths, swim, or use a hot tub.  If you are female, wipe from   front to back after having a bowel movement. Contact a health care provider if:  Your urine is cloudy.  Your urine smells unusually bad.  Your catheter gets clogged.  Your catheter starts to leak.  Your bladder feels full. Get help right away if:  You have redness, swelling, or pain where the catheter enters your body.  You have fluid, blood, pus, or a bad smell coming from the area where the catheter enters your body.  The area where the catheter enters your body feels warm to the touch.  You have a fever.  You have pain in your abdomen, legs, lower back, or bladder.  You see blood in the catheter.  Your urine is pink or red.  You have nausea, vomiting, or chills.  Your urine is not draining into the bag.  Your catheter gets pulled out. Summary  An indwelling urinary catheter is a thin, flexible, germ-free (sterile) tube that is placed into the bladder to help drain urine out of the body.  The catheter is inserted into the part of the body that drains urine from the bladder (urethra).  Take good care of your catheter to keep it working properly and help prevent problems from developing.  Always wash your hands before and after you handle your catheter or drainage bag.  Never pull on your catheter or try to remove it. This information is not intended to replace advice given to you by your health care provider. Make sure you discuss any questions  you have with your health care provider. Document Revised: 06/16/2018 Document Reviewed: 10/08/2016 Elsevier Patient Education  2020 [employer]  

## 2019-06-26 NOTE — Progress Notes (Signed)
Simple Catheter Placement  Due to urinary retention patient is present today for a foley cath placement.  Patient was cleaned and prepped in a sterile fashion with betadine. A 16 FR foley catheter was inserted, urine return was noted  790ml, urine was cloudy yellow in color.  The balloon was filled with 10cc of sterile water.  A leg bag was attached for drainage. Patient was given instruction on proper catheter care.  Patient tolerated well, no complications were noted   Performed by: Debroah Loop, PA-C and Kerman Passey, RMA

## 2019-06-26 NOTE — Progress Notes (Signed)
06/26/2019 6:01 PM   [name] [name] [birth year] [id number]  CC: Voiding trial follow-up  HPI: [name] [name] is a 79 y.o. 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  She was admitted from 06/11/2019 to 06/15/2019 with acute on chronic urinary retention and acute renal failure.  She underwent cystoscopy with urethral dilation with Dr. Bernardo Heater on 06/12/2019.  Foley placed intraoperatively.  Foley catheter was removed in clinic yesterday.  She reported the inability to urinate throughout the day with afternoon follow-up PVR of 231 mL.  She elected to defer CIC teaching versus Foley catheter replacement pending recheck today and further discussion with her husband on how to proceed.  Today, patient reports that she urinated multiple times since leaving our office yesterday afternoon.  She did not discuss further management with her husband as she believed that she was emptying her bladder.  She does not have any abdominal pain at this time and feels she is emptying her bladder appropriately.  PVR 652mL.  PMH: Past Medical History:  Diagnosis Date  . Breast cancer ([place]) 2018   Right breast  . 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 Mucin noted on biopsy, not on wide excision.  . Coronary artery disease   . GERD (gastroesophageal reflux disease)   . Hyperlipidemia   . Hypertension   . 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 Dr. Kellie Moor DX by punch bioppsy  . Lichen planus    right breast   . Lichen planus   . MI (myocardial infarction) ([place])    2015  . Personal history of radiation therapy 09/2016   RIGHT lumpectomy w/ radiation    Surgical History: Past Surgical History:  Procedure Laterality Date  . ABDOMINAL HYSTERECTOMY    . APPENDECTOMY    . BREAST BIOPSY Right  07/23/2016   INVASIVE MAMMARY CARCINOMA WITH AREAS OF EXTRACELLULAR MUCIN  . BREAST EXCISIONAL BIOPSY     INVASIVE MUCINOUS MAMMARY CARCINOMA.   Marland Kitchen BREAST LUMPECTOMY Right 08/20/2016   INVASIVE MUCINOUS MAMMARY CARCINOMA. /Grade 2   . COLONOSCOPY  2016  . COLONOSCOPY N/A 09/06/2018   Procedure: COLONOSCOPY;  Surgeon: Virgel Manifold, MD;  Location: [facility] ENDOSCOPY;  Service: Endoscopy;  Laterality: N/A;  . CORONARY ANGIOPLASTY WITH STENT PLACEMENT    . CYSTOSCOPY    . CYSTOSCOPY WITH URETHRAL DILATATION N/A 06/12/2019   Procedure: CYSTOSCOPY WITH URETHRAL DILATATION;  Surgeon: Abbie Sons, MD;  Location: [facility] ORS;  Service: Urology;  Laterality: N/A;  . ESOPHAGOGASTRODUODENOSCOPY N/A 09/06/2018   Procedure: ESOPHAGOGASTRODUODENOSCOPY (EGD);  Surgeon: Virgel Manifold, MD;  Location: [facility] ENDOSCOPY;  Service: Endoscopy;  Laterality: N/A;  . OOPHORECTOMY    . PARTIAL MASTECTOMY WITH AXILLARY SENTINEL LYMPH NODE BIOPSY Right 08/09/2016   Procedure: PARTIAL MASTECTOMY WITH AXILLARY SENTINEL LYMPH NODE BIOPSY;  Surgeon: Robert Bellow, MD;  Location: [facility] ORS;  Service: General;  Laterality: Right;  . TONSILLECTOMY      Home Medications:  Allergies as of 06/26/2019      Reactions   Celebrex  [celecoxib]    Dark stools.   Penicillins Other (See Comments)   Childhood reaction Has patient had a PCN reaction causing immediate rash, facial/tongue/throat swelling, SOB or lightheadedness with hypotension: Unknown Has patient had a PCN reaction causing severe rash involving mucus membranes or skin necrosis: Unknown  Has patient had a PCN reaction that required hospitalization: Unknown Has patient had a PCN reaction occurring within the last 10 years: Unknown If all of the above answers are "NO", then may proceed with Cephalosporin use.   Prevacid [lansoprazole] Diarrhea      Medication List       Accurate as of June 26, 2019  6:01 PM. If you have any questions, ask your nurse or  doctor.        CALCIUM 500+D PO Take by mouth every other day.   lisinopril 20 MG tablet Commonly known as: ZESTRIL Take 20 mg by mouth daily.   omeprazole 20 MG capsule Commonly known as: PRILOSEC TAKE 1 CAPSULE (20 MG TOTAL) BY MOUTH DAILY AS NEEDED (INDIGESTION).       Allergies:  Allergies  Allergen Reactions  . Celebrex  [Celecoxib]     Dark stools.  . Penicillins Other (See Comments)    Childhood reaction Has patient had a PCN reaction causing immediate rash, facial/tongue/throat swelling, SOB or lightheadedness with hypotension: Unknown Has patient had a PCN reaction causing severe rash involving mucus membranes or skin necrosis: Unknown Has patient had a PCN reaction that required hospitalization: Unknown Has patient had a PCN reaction occurring within the last 10 years: Unknown If all of the above answers are "NO", then may proceed with Cephalosporin use.   Marland Kitchen Prevacid [Lansoprazole] Diarrhea    Family History: Family History  Problem Relation Age of Onset  . Hyperlipidemia Mother   . Allergies Mother   . Cerebral aneurysm Mother        cause of death at age 39  . Heart disease Father        Fatal MI ag 75  . Cancer Brother        lung cancer  . Hyperlipidemia Brother   . Colonic polyp Brother   . Healthy Son   . Cancer - Lung Brother        colon  . Healthy Son   . Breast cancer Neg Hx     Social History:   reports that she quit smoking about 21 years ago. Her smoking use included cigarettes. She has a 2.50 pack-year smoking history. She has never used smokeless tobacco. She reports that she does not drink alcohol or use drugs.  Physical Exam: BP 115/69   Pulse 90   Ht 5\' 3"  (1.6 m)   Wt 133 lb (60.3 kg)   BMI 23.56 kg/m   Constitutional:  Alert, no acute distress, nontoxic appearing HEENT: [place], AT Cardiovascular: No clubbing, cyanosis, or edema Respiratory: Normal respiratory effort, no increased work of breathing GU: urethral meatus  difficult to visualize, located within the vaginal introitus Skin: No rashes, bruises or suspicious lesions Neurologic: Grossly intact, no focal deficits, moving all 4 extremities Psychiatric: Withdrawn mood  Laboratory Data: Results for orders placed or performed in visit on 06/26/19  Bladder Scan (Post Void Residual) in office  Result Value Ref Range   Scan Result 645    Scan Result 800    Assessment & Plan:   1. Urinary retention 79 year old female with a history of incomplete bladder emptying secondary to recurrent urethral stricture, now with recurrent retention following voiding trial yesterday in the setting of recent cystoscopy and urethral dilation for management of urinary retention with acute renal failure.  I had a lengthy conversation with the patient in clinic this morning.  I explained that her bladder is not emptying appropriately and that she is at  significant risk of recurrent renal injury without further intervention.  I recommended CIC teaching versus chronic indwelling Foley catheter placement for management of her neurogenic bladder.  Patient repeated throughout her conversation that she was shocked to hear that she was not emptying her bladder and she was not expecting this news today.  She stated she was unable to make a decision about how to proceed with management of her bladder without discussing further with her husband.  She did not bring her purse with her and was not accompanied in clinic by any family members to assist in this conversation.  She asked if she could return to clinic at a later time.  I explained that she was in acute urinary retention and that it would not be safe for her medically to delay intervention past today.  Ultimately, she agreed to return to clinic this afternoon after having discussed her treatment options with her family members.  I counseled her to return to clinic with 1 family member in person and with her cell phone so that we could  discuss this further over the phone with any relevant parties.  She expressed understanding.  Patient's grandson contacted the office at the time of her scheduled afternoon follow-up to request that we reschedule the appointment to a later date, as his grandmother did not wish to undergo Foley catheter placement at this time.  I explained again that the patient was in acute urinary retention and that intervention was necessary today to manage this problem to avoid her going into kidney failure and preserve her renal function.  Grandson agreed to return the patient to the office.  Subsequently I spoke with the patient's son, Matika Naidoo, via telephone.  We also had a lengthy phone conversation in which I repeated the advice I had previously given to the patient today and yesterday.  I again explained that while she is urinating, she is not appropriately emptying her bladder and that despite her ability to pass some urine, she is back in urinary retention today.  I explained that she requires intervention to drain her bladder to avoid recurrent kidney damage, which without further intervention would become permanent, lead to renal failure, and require dialysis.  I explained that his mother's treatment options at this time include CIC, chronic indwelling Foley catheter, or suprapubic catheter placement.  Patient's son expressed understanding of this and was primarily interested in CIC.  He called the office back following our conversation to request that we proceed with CIC teaching today.  Patient ultimately returned to clinic 2 hours later than scheduled, after clinic had already closed.  PVR 800 mL at that time.  She was accompanied by her grandson.  I explained to the patient and the grandson that it was too late in the day for CIC teaching.  I offered them Foley catheter placement with a repeat visit in my clinic tomorrow for CIC teaching.  They agreed.  Foley catheter placed this afternoon, see separate  procedure note for details.  Ultimately, patient again expressed curiosity over whether her bladder will regain function with time.  I again reiterated that this is not likely to occur, as her urinary retention is a chronic problem that has worsened with time.  I explained that she will require assistance emptying her bladder for the remainder of her life.   Of note, I am concerned about the patient's ability to self-catheterize given the positioning of her urethral meatus within the vaginal introitus. Additionally, she has reported genital  discomfort with her prior Foley catheter and struggled to tolerate catheterization today in clinic. If she is unable to perform CIC in clinic tomorrow, I strongly recommend short-term Foley catheter with referral to IR for SP tube placement. - Bladder Scan (Post Void Residual) in office   Return in about 1 day (around 06/27/2019) for CIC teaching.   I spent 100 minutes on the day of the encounter to include pre-visit record review, face-to-face time with the patient, and post-visit ordering of tests.   Debroah Loop, PA-C  [facility] [address], [place], [place] [address] [phone]

## 2019-06-27 ENCOUNTER — Telehealth: Payer: Self-pay

## 2019-06-27 ENCOUNTER — Ambulatory Visit: Payer: Self-pay | Admitting: Physician Assistant

## 2019-06-27 ENCOUNTER — Other Ambulatory Visit: Payer: Self-pay

## 2019-06-27 ENCOUNTER — Observation Stay
Admission: EM | Admit: 2019-06-27 | Discharge: 2019-06-28 | Disposition: A | Discharge status: Home / Self Care | Payer: PPO | Attending: Internal Medicine | Admitting: Internal Medicine

## 2019-06-27 ENCOUNTER — Inpatient Hospital Stay: Payer: PPO | Admitting: Family Medicine

## 2019-06-27 DIAGNOSIS — R55 Syncope and collapse: Principal | ICD-10-CM

## 2019-06-27 DIAGNOSIS — I471 Supraventricular tachycardia, unspecified: Secondary | ICD-10-CM

## 2019-06-27 DIAGNOSIS — K219 Gastro-esophageal reflux disease without esophagitis: Secondary | ICD-10-CM | POA: Diagnosis not present

## 2019-06-27 DIAGNOSIS — Z79899 Other long term (current) drug therapy: Secondary | ICD-10-CM | POA: Insufficient documentation

## 2019-06-27 DIAGNOSIS — I252 Old myocardial infarction: Secondary | ICD-10-CM | POA: Insufficient documentation

## 2019-06-27 DIAGNOSIS — Z87891 Personal history of nicotine dependence: Secondary | ICD-10-CM | POA: Insufficient documentation

## 2019-06-27 DIAGNOSIS — E785 Hyperlipidemia, unspecified: Secondary | ICD-10-CM | POA: Diagnosis not present

## 2019-06-27 DIAGNOSIS — Z853 Personal history of malignant neoplasm of breast: Secondary | ICD-10-CM | POA: Diagnosis not present

## 2019-06-27 DIAGNOSIS — Z9011 Acquired absence of right breast and nipple: Secondary | ICD-10-CM | POA: Insufficient documentation

## 2019-06-27 DIAGNOSIS — I2510 Atherosclerotic heart disease of native coronary artery without angina pectoris: Secondary | ICD-10-CM | POA: Diagnosis not present

## 2019-06-27 DIAGNOSIS — Z801 Family history of malignant neoplasm of trachea, bronchus and lung: Secondary | ICD-10-CM | POA: Insufficient documentation

## 2019-06-27 DIAGNOSIS — Z923 Personal history of irradiation: Secondary | ICD-10-CM | POA: Diagnosis not present

## 2019-06-27 DIAGNOSIS — Z20822 Contact with and (suspected) exposure to covid-19: Secondary | ICD-10-CM | POA: Insufficient documentation

## 2019-06-27 DIAGNOSIS — Z955 Presence of coronary angioplasty implant and graft: Secondary | ICD-10-CM | POA: Insufficient documentation

## 2019-06-27 DIAGNOSIS — R0902 Hypoxemia: Secondary | ICD-10-CM | POA: Diagnosis not present

## 2019-06-27 DIAGNOSIS — I10 Essential (primary) hypertension: Secondary | ICD-10-CM | POA: Insufficient documentation

## 2019-06-27 DIAGNOSIS — Z886 Allergy status to analgesic agent status: Secondary | ICD-10-CM | POA: Diagnosis not present

## 2019-06-27 DIAGNOSIS — Z880 Allergy status to penicillin: Secondary | ICD-10-CM | POA: Insufficient documentation

## 2019-06-27 LAB — URINALYSIS, COMPLETE (UACMP) WITH MICROSCOPIC
Bilirubin Urine: NEGATIVE
Glucose, UA: NEGATIVE mg/dL
Ketones, ur: NEGATIVE mg/dL
Nitrite: NEGATIVE
Protein, ur: 100 mg/dL — AB
Specific Gravity, Urine: 1.009 (ref 1.005–1.030)
WBC, UA: >50 WBC/hpf — ABNORMAL HIGH (ref 0–5)
pH: 5 (ref 5.0–8.0)

## 2019-06-27 LAB — COMPREHENSIVE METABOLIC PANEL
ALT: 13 U/L (ref 0–44)
AST: 16 U/L (ref 15–41)
Albumin: 3.3 g/dL — ABNORMAL LOW (ref 3.5–5.0)
Alkaline Phosphatase: 55 U/L (ref 38–126)
Anion gap: 8 (ref 5–15)
BUN: 28 mg/dL — ABNORMAL HIGH (ref 8–23)
CO2: 23 mmol/L (ref 22–32)
Calcium: 9 mg/dL (ref 8.9–10.3)
Chloride: 108 mmol/L (ref 98–111)
Creatinine, Ser: 1.44 mg/dL — ABNORMAL HIGH (ref 0.44–1.00)
GFR calc Af Amer: 40 mL/min — ABNORMAL LOW (ref >60)
GFR calc non Af Amer: 34 mL/min — ABNORMAL LOW (ref >60)
Glucose, Bld: 104 mg/dL — ABNORMAL HIGH (ref 70–99)
Potassium: 4.4 mmol/L (ref 3.5–5.1)
Sodium: 139 mmol/L (ref 135–145)
Total Bilirubin: 0.9 mg/dL (ref 0.3–1.2)
Total Protein: 6.4 g/dL — ABNORMAL LOW (ref 6.5–8.1)

## 2019-06-27 LAB — CBC WITH DIFFERENTIAL/PLATELET
Abs Immature Granulocytes: 0.07 10*3/uL (ref 0.00–0.07)
Basophils Absolute: 0.1 10*3/uL (ref 0.0–0.1)
Basophils Relative: 1 %
Eosinophils Absolute: 0.1 10*3/uL (ref 0.0–0.5)
Eosinophils Relative: 1 %
HCT: 31.8 % — ABNORMAL LOW (ref 36.0–46.0)
Hemoglobin: 10 g/dL — ABNORMAL LOW (ref 12.0–15.0)
Immature Granulocytes: 1 %
Lymphocytes Relative: 13 %
Lymphs Abs: 1.1 10*3/uL (ref 0.7–4.0)
MCH: 27.1 pg (ref 26.0–34.0)
MCHC: 31.4 g/dL (ref 30.0–36.0)
MCV: 86.2 fL (ref 80.0–100.0)
Monocytes Absolute: 0.8 10*3/uL (ref 0.1–1.0)
Monocytes Relative: 9 %
Neutro Abs: 6.9 10*3/uL (ref 1.7–7.7)
Neutrophils Relative %: 75 %
Platelets: 235 10*3/uL (ref 150–400)
RBC: 3.69 MIL/uL — ABNORMAL LOW (ref 3.87–5.11)
RDW: 21.2 % — ABNORMAL HIGH (ref 11.5–15.5)
Smear Review: NORMAL
WBC: 9.1 10*3/uL (ref 4.0–10.5)
nRBC: 0 % (ref 0.0–0.2)

## 2019-06-27 LAB — TROPONIN I (HIGH SENSITIVITY)
Troponin I (High Sensitivity): 4 ng/L (ref <18)
Troponin I (High Sensitivity): 4 ng/L (ref <18)

## 2019-06-27 LAB — RESPIRATORY PANEL BY RT PCR (FLU A&B, COVID)
Influenza A by PCR: NEGATIVE
Influenza B by PCR: NEGATIVE
SARS Coronavirus 2 by RT PCR: NEGATIVE

## 2019-06-27 LAB — MAGNESIUM: Magnesium: 2.2 mg/dL (ref 1.7–2.4)

## 2019-06-27 MED ORDER — PANTOPRAZOLE SODIUM 40 MG PO TBEC
40.0000 mg | DELAYED_RELEASE_TABLET | Freq: Every day | ORAL | Status: DC
  Administered 2019-06-27 – 2019-06-28 (×2): 40 mg via ORAL
  Filled 2019-06-27: qty 1

## 2019-06-27 MED ORDER — CALCIUM CARB-CHOLECALCIFEROL 500-400 MG-UNIT PO TABS: ORAL_TABLET | ORAL | Status: DC

## 2019-06-27 MED ORDER — LETROZOLE 2.5 MG PO TABS
2.5000 mg | ORAL_TABLET | Freq: Every day | ORAL | Status: DC
  Administered 2019-06-27 – 2019-06-28 (×2): 2.5 mg via ORAL
  Filled 2019-06-27 (×3): qty 1

## 2019-06-27 MED ORDER — SODIUM CHLORIDE 0.9 % IV BOLUS
1000.0000 mL | Freq: Once | INTRAVENOUS | Status: AC
  Administered 2019-06-27: 1000 mL via INTRAVENOUS

## 2019-06-27 MED ORDER — CALCIUM CARBONATE-VITAMIN D 500-200 MG-UNIT PO TABS
1.0000 | ORAL_TABLET | ORAL | Status: DC
  Administered 2019-06-28: 1 via ORAL
  Filled 2019-06-27: qty 1

## 2019-06-27 NOTE — H&P (Signed)
[name] [name] is an 79 y.o. female.   Chief Complaint: Syncope. HPI:  Patient is a 79 year old female with multiple medical problems including breast cancer, coronary artery disease, essential hypertension, who presents to the hospital after syncope episode. Patient is quite healthy with her age.  She was in the hospital earlier this month with acute renal failure secondary to urethral obstruction.  She was seen by urology, her renal function had recovered at time of discharge.  She was able to urinate on her own at that time.  She went to the urologist office yesterday for checkup, was found to have a significant urinary retention.  A Foley catheter was anchored.  Patient was sent home afterwards.  Patient had an episode of syncope today while sitting in his grandson's car.  She did not have any dizziness or headache prior to syncope episode, no nausea vomiting.  She just simply dozed off in the car.  Her grandson was unable to wake her up.  She was brought to the hospital.  She improved pretty quickly.  She did not have any symptoms afterwards. In the emergency room, she recorded a short run of PSVT (8 beats).  Patient did not have any symptoms at that time. Past Medical History:  Diagnosis Date  . Breast cancer ([place]) 2018   Right breast  . 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 Mucin noted on biopsy, not on wide excision.  . Coronary artery disease   . GERD (gastroesophageal reflux disease)   . Hyperlipidemia   . Hypertension   . 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 Dr. Kellie Moor DX by punch bioppsy  . Lichen planus    right breast   . Lichen planus   . MI (myocardial infarction) ([place])    2015  . Personal history of radiation therapy 09/2016   RIGHT lumpectomy w/ radiation    Past Surgical History:  Procedure Laterality Date  . ABDOMINAL HYSTERECTOMY    . APPENDECTOMY    . BREAST BIOPSY  Right 07/23/2016   INVASIVE MAMMARY CARCINOMA WITH AREAS OF EXTRACELLULAR MUCIN  . BREAST EXCISIONAL BIOPSY     INVASIVE MUCINOUS MAMMARY CARCINOMA.   Marland Kitchen BREAST LUMPECTOMY Right 08/20/2016   INVASIVE MUCINOUS MAMMARY CARCINOMA. /Grade 2   . COLONOSCOPY  2016  . COLONOSCOPY N/A 09/06/2018   Procedure: COLONOSCOPY;  Surgeon: Virgel Manifold, MD;  Location: [facility] ENDOSCOPY;  Service: Endoscopy;  Laterality: N/A;  . CORONARY ANGIOPLASTY WITH STENT PLACEMENT    . CYSTOSCOPY    . CYSTOSCOPY WITH URETHRAL DILATATION N/A 06/12/2019   Procedure: CYSTOSCOPY WITH URETHRAL DILATATION;  Surgeon: Abbie Sons, MD;  Location: [facility] ORS;  Service: Urology;  Laterality: N/A;  . ESOPHAGOGASTRODUODENOSCOPY N/A 09/06/2018   Procedure: ESOPHAGOGASTRODUODENOSCOPY (EGD);  Surgeon: Virgel Manifold, MD;  Location: [facility] ENDOSCOPY;  Service: Endoscopy;  Laterality: N/A;  . OOPHORECTOMY    . PARTIAL MASTECTOMY WITH AXILLARY SENTINEL LYMPH NODE BIOPSY Right 08/09/2016   Procedure: PARTIAL MASTECTOMY WITH AXILLARY SENTINEL LYMPH NODE BIOPSY;  Surgeon: Robert Bellow, MD;  Location: [facility] ORS;  Service: General;  Laterality: Right;  . TONSILLECTOMY      Family History  Problem Relation Age of Onset  . Hyperlipidemia Mother   . Allergies Mother   . Cerebral aneurysm Mother        cause of death at age 14  . Heart disease Father  Fatal MI ag 81  . Cancer Brother        lung cancer  . Hyperlipidemia Brother   . Colonic polyp Brother   . Healthy Son   . Cancer - Lung Brother        colon  . Healthy Son   . Breast cancer Neg Hx    Social History:  reports that she quit smoking about 21 years ago. Her smoking use included cigarettes. She has a 2.50 pack-year smoking history. She has never used smokeless tobacco. She reports that she does not drink alcohol or use drugs.  Allergies:  Allergies  Allergen Reactions  . Celebrex  [Celecoxib]     Dark stools.  . Penicillins Other (See Comments)     Childhood reaction Has patient had a PCN reaction causing immediate rash, facial/tongue/throat swelling, SOB or lightheadedness with hypotension: Unknown Has patient had a PCN reaction causing severe rash involving mucus membranes or skin necrosis: Unknown Has patient had a PCN reaction that required hospitalization: Unknown Has patient had a PCN reaction occurring within the last 10 years: Unknown If all of the above answers are "NO", then may proceed with Cephalosporin use.   Marland Kitchen Prevacid [Lansoprazole] Diarrhea    (Not in a hospital admission)   Results for orders placed or performed during the hospital encounter of 06/27/19 (from the past 48 hour(s))  CBC with Differential     Status: Abnormal   Collection Time: 06/27/19 12:41 PM  Result Value Ref Range   WBC 9.1 4.0 - 10.5 K/uL   RBC 3.69 (L) 3.87 - 5.11 MIL/uL   Hemoglobin 10.0 (L) 12.0 - 15.0 g/dL   HCT 31.8 (L) 36.0 - 46.0 %   MCV 86.2 80.0 - 100.0 fL   MCH 27.1 26.0 - 34.0 pg   MCHC 31.4 30.0 - 36.0 g/dL   RDW 21.2 (H) 11.5 - 15.5 %   Platelets 235 150 - 400 K/uL   nRBC 0.0 0.0 - 0.2 %   Neutrophils Relative % 75 %   Neutro Abs 6.9 1.7 - 7.7 K/uL   Lymphocytes Relative 13 %   Lymphs Abs 1.1 0.7 - 4.0 K/uL   Monocytes Relative 9 %   Monocytes Absolute 0.8 0.1 - 1.0 K/uL   Eosinophils Relative 1 %   Eosinophils Absolute 0.1 0.0 - 0.5 K/uL   Basophils Relative 1 %   Basophils Absolute 0.1 0.0 - 0.1 K/uL   WBC Morphology MORPHOLOGY UNREMARKABLE    RBC Morphology MORPHOLOGY UNREMARKABLE    Smear Review Normal platelet morphology    Immature Granulocytes 1 %   Abs Immature Granulocytes 0.07 0.00 - 0.07 K/uL    Comment: Performed at [facility], [address] [place], [place] [address]  Comprehensive metabolic panel     Status: Abnormal   Collection Time: 06/27/19 12:41 PM  Result Value Ref Range   Sodium 139 135 - 145 mmol/L   Potassium 4.4 3.5 - 5.1 mmol/L   Chloride 108 98 - 111 mmol/L   CO2 23 22 - 32  mmol/L   Glucose, Bld 104 (H) 70 - 99 mg/dL    Comment: Glucose reference range applies only to samples taken after fasting for at least 8 hours.   BUN 28 (H) 8 - 23 mg/dL   Creatinine, Ser 1.44 (H) 0.44 - 1.00 mg/dL   Calcium 9.0 8.9 - 10.3 mg/dL   Total Protein 6.4 (L) 6.5 - 8.1 g/dL   Albumin 3.3 (L) 3.5 - 5.0  g/dL   AST 16 15 - 41 U/L   ALT 13 0 - 44 U/L   Alkaline Phosphatase 55 38 - 126 U/L   Total Bilirubin 0.9 0.3 - 1.2 mg/dL   GFR calc non Af Amer 34 (L) >60 mL/min   GFR calc Af Amer 40 (L) >60 mL/min   Anion gap 8 5 - 15    Comment: Performed at [facility], [place], [place] [address]  Troponin I (High Sensitivity)     Status: None   Collection Time: 06/27/19 12:41 PM  Result Value Ref Range   Troponin I (High Sensitivity) 4 <18 ng/L    Comment: (NOTE) Elevated high sensitivity troponin I (hsTnI) values and significant  changes across serial measurements may suggest ACS but many other  chronic and acute conditions are known to elevate hsTnI results.  Refer to the "Links" section for chest pain algorithms and additional  guidance. Performed at [facility], [address] [place], [place] [address]   Urinalysis, Complete w Microscopic     Status: Abnormal   Collection Time: 06/27/19 12:41 PM  Result Value Ref Range   Color, Urine YELLOW (A) YELLOW   APPearance CLOUDY (A) CLEAR   Specific Gravity, Urine 1.009 1.005 - 1.030   pH 5.0 5.0 - 8.0   Glucose, UA NEGATIVE NEGATIVE mg/dL   Hgb urine dipstick SMALL (A) NEGATIVE   Bilirubin Urine NEGATIVE NEGATIVE   Ketones, ur NEGATIVE NEGATIVE mg/dL   Protein, ur 100 (A) NEGATIVE mg/dL   Nitrite NEGATIVE NEGATIVE   Leukocytes,Ua LARGE (A) NEGATIVE   RBC / HPF 11-20 0 - 5 RBC/hpf   WBC, UA >50 (H) 0 - 5 WBC/hpf   Bacteria, UA MANY (A) NONE SEEN   Squamous Epithelial / LPF 0-5 0 - 5   WBC Clumps PRESENT     Comment: Performed at [facility], [address], [place] [address]  Troponin I (High Sensitivity)     Status: None   Collection Time: 06/27/19  4:23 PM  Result Value Ref Range   Troponin I (High Sensitivity) 4 <18 ng/L    Comment: (NOTE) Elevated high sensitivity troponin I (hsTnI) values and significant  changes across serial measurements may suggest ACS but many other  chronic and acute conditions are known to elevate hsTnI results.  Refer to the "Links" section for chest pain algorithms and additional  guidance. Performed at [facility], [address] [place], [place] [address]    No results found.  Review of Systems  Constitutional: Negative for activity change, appetite change, diaphoresis, fatigue, fever and unexpected weight change.  HENT: Negative for congestion, postnasal drip and rhinorrhea.   Eyes: Negative for pain, discharge and itching.  Respiratory: Negative for cough, choking and shortness of breath.   Cardiovascular: Negative for chest pain, palpitations and leg swelling.  Gastrointestinal: Negative for abdominal distention, abdominal pain, constipation, diarrhea and nausea.  Endocrine: Negative for cold intolerance and heat intolerance.  Genitourinary: Positive for difficulty urinating. Negative for dysuria, flank pain, frequency, hematuria and urgency.  Musculoskeletal: Negative for back pain and joint swelling.  Neurological: Negative for dizziness, numbness and headaches.  Psychiatric/Behavioral: Negative for agitation, confusion and hallucinations.    Blood pressure (!) 155/75, pulse 77, temperature 98.6 F (37 C), temperature source Oral, resp. rate 14, height 5\' 4"  (1.626 m), weight 59 kg, SpO2 98 %. Physical Exam  Constitutional: She is oriented to person, place, and time. She appears  well-developed and well-nourished. No distress.  HENT:  Head: Normocephalic and atraumatic.  Mouth/Throat: No oropharyngeal exudate.  Eyes: Pupils are equal, round, and reactive to light. Conjunctivae are  normal. No scleral icterus.  Neck: No tracheal deviation present. No thyromegaly present.  Cardiovascular: Normal rate, regular rhythm and intact distal pulses.  No murmur heard. Respiratory: Effort normal and breath sounds normal. No respiratory distress. She has no wheezes. She has no rales.  GI: Soft. Bowel sounds are normal. She exhibits no distension. There is no abdominal tenderness. There is no rebound.  Musculoskeletal:        General: No tenderness or edema. Normal range of motion.     Cervical back: Normal range of motion and neck supple.  Lymphadenopathy:    She has no cervical adenopathy.  Neurological: She is alert and oriented to person, place, and time. No cranial nerve deficit.  Skin: Skin is warm and dry. She is not diaphoretic.  Psychiatric: She has a normal mood and affect.     Assessment/Plan #1.  Syncope and collapse. Patient recently had a severe urinary retention; she has a large amount of urination of the Foley catheter was anchored.  Syncope could be secondary to large amount of post obstruction diuresis.  Patient currently has no symptoms.  Will monitor patient overnight, telemetry for arrhythmia.  Most likely she will be discharged home tomorrow if there is no additional syncopes.  2.  Short run of PSVT.   I reviewed the strip myself, only 8 beats.  No need for additional treatment.  3.  Essential hypertension. Continue home medicine.  4.  History of coronary artery disease.   Stable.  Sharen Hones, MD 06/27/2019, 5:43 PM

## 2019-06-27 NOTE — ED Notes (Signed)
Husband remains at bedside at this time. Pt speaking with pharmacy at this time.   Patient and family verbalized understanding of delay to admission bed.

## 2019-06-27 NOTE — ED Notes (Signed)
Urine and labs sent

## 2019-06-27 NOTE — Progress Notes (Deleted)
    Established patient visit    Patient: [name] [name]   DOB: 1940-06-02   79 y.o. Female  MRN: [id number] Visit Date: 06/27/2019  Today's healthcare provider: Wilhemena Durie, MD   No chief complaint on file.  Subjective    HPI   Diabetes Mellitus Type II, Follow-up  Lab Results  Component Value Date   HGBA1C 5.6 04/04/2019   HGBA1C 6.2 (H) 09/05/2018   HGBA1C 5.9 (H) 04/25/2018   Last seen for diabetes 3 months ago.  Management since then includes no changes. She reports {excellent/good/fair/poor:19665} compliance with treatment. She {is/is not:21021397} having side effects. {document side effects if present:1} Symptoms: {Yes/No:20286} fatigue {Yes/No:20286} foot ulcerations {Yes/No:20286} appetite changes {Yes/No:20286} nausea {Yes/No:20286} paresthesia (numbness or tingling) of the feet  {Yes/No:20286} polydipsia (excessive thirst) {Yes/No:20286} polyuria (frequent urination) {Yes/No:20286} visual disturbances  {Yes/No:20286} vomiting  Home blood sugar records: {diabetes glucometry results:16657}  Episodes of hypoglycemia? {Yes/No:20286} {enter details if yes:1}   Current insulin regiment: {Type 'None' if not taking insulin                                            otherwise enter complete                                             details of insulin regiment:1} Most Recent Eye Exam: *** {Current exercise:16438:::1} {Current diet habits:16563:::1}  Pertinent Labs: Lab Results  Component Value Date   CHOL 157 04/25/2018   HDL 51 04/25/2018   LDLCALC 84 04/25/2018   TRIG 111 04/25/2018   CHOLHDL 3.1 04/25/2018   Lab Results  Component Value Date   NA 140 06/15/2019   K 3.3 (L) 06/15/2019   CO2 29 06/15/2019   GLUCOSE 98 06/15/2019   BUN 19 06/15/2019   CREATININE 1.30 (H) 06/15/2019   CALCIUM 8.5 (L) 06/15/2019   GFRNONAA 39 (L) 06/15/2019   GFRAA 45 (L) 06/15/2019     Wt Readings from Last 3 Encounters:  06/26/19 133 lb (60.3 kg)    06/18/19 133 lb (60.3 kg)  06/12/19 133 lb 2.5 oz (60.4 kg)    -----------------------------------------------------------------------------------------  {Show patient history (optional):23778::" "}   Medications: Outpatient Medications Prior to Visit  Medication Sig  . Calcium Carb-Cholecalciferol (CALCIUM 500+D PO) Take by mouth every other day.  . lisinopril (ZESTRIL) 20 MG tablet Take 20 mg by mouth daily.  Marland Kitchen omeprazole (PRILOSEC) 20 MG capsule TAKE 1 CAPSULE (20 MG TOTAL) BY MOUTH DAILY AS NEEDED (INDIGESTION).   No facility-administered medications prior to visit.    Review of Systems  {Show previous labs (optional):23779::" "}   Objective    There were no vitals taken for this visit. {Show previous vital signs (optional):23777::" "}  Physical Exam  ***  No results found for any visits on 07/03/19.   Assessment & Plan    ***  No follow-ups on file.      {provider attestation***:1}   Wilhemena Durie, MD  [facility] [phone] (phone) [phone] (fax)  [place]

## 2019-06-27 NOTE — ED Triage Notes (Signed)
states was gettting into car and synopsized. Denies cp or sob.

## 2019-06-27 NOTE — ED Notes (Signed)
Dietary called for pt.

## 2019-06-27 NOTE — ED Notes (Signed)
Patient reports foley placed yesterday at her doctors office due to retention.

## 2019-06-27 NOTE — Telephone Encounter (Signed)
Copied from [place] [id number]. Topic: General - Other >> Jun 27, 2019  3:47 PM Yvette Rack wrote: Reason for CRM: Soni with [facility] requests call back from Dr. Alben Spittle nurse to discuss patient. Cb# [phone]

## 2019-06-27 NOTE — Telephone Encounter (Signed)
Returned call to [employer].

## 2019-06-27 NOTE — ED Notes (Signed)
S/o at  Bedside. Patient leg bag emptied and urine out put documented.

## 2019-06-27 NOTE — ED Provider Notes (Signed)
[facility] Emergency Department Provider Note  ____________________________________________   First MD Initiated Contact with Patient 06/27/19 1152     (approximate)  I have reviewed the triage vital signs and the nursing notes.   HISTORY  Chief Complaint Near Syncope    HPI [name] [name] is a 79 y.o. female with history as below here with syncopal episode.  The patient has had a fairly complex recent history including recent admission for urinary retention requiring Foley catheter placement.  She has been intermittently self cathing since then.  She was going to see her urologist today for reevaluation, when she began to feel very tired.  She told her grandson that she needed to go to sleep.  She then reportedly had a witnessed syncopal episode in which her eyes rolled back and she was nonresponsive.  The grandson pulled into the doctor's office.  She regained consciousness and was able to answer questions, but then shortly thereafter, lost consciousness again, and was not responsive for several seconds.  She was subsequently brought to the ED.  She does not recall these episodes.  She does states she feels generally fatigued.  Denies any other complaints currently.       Past Medical History:  Diagnosis Date  . Breast cancer ([place]) 2018   Right breast  . 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 Mucin noted on biopsy, not on wide excision.  . Coronary artery disease   . GERD (gastroesophageal reflux disease)   . Hyperlipidemia   . Hypertension   . 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 Dr. Kellie Moor DX by punch bioppsy  . Lichen planus    right breast   . Lichen planus   . MI (myocardial infarction) ([place])    2015  . Personal history of radiation therapy 09/2016   RIGHT lumpectomy w/ radiation    Patient Active Problem List   Diagnosis Date Noted  . History  of breast cancer 06/12/2019  . Acute renal failure ([place]) 06/12/2019  . Generalized weakness 06/12/2019  . Hyperkalemia 06/12/2019  . Hypertensive urgency 06/12/2019  . Disorder of bursae of shoulder region 10/20/2018  . Lower GI bleeding 09/10/2018  . Hiatal hernia   . Melena   . Polyp of ascending colon   . Diverticulosis of large intestine without diverticulitis   . GIB (gastrointestinal bleeding) 09/05/2018  . LVH (left ventricular hypertrophy) due to hypertensive disease, without heart failure 04/10/2018  . Chest pain 03/31/2018  . Vasovagal syncope 03/20/2018  . Lichen planus [id number]  . Malignant neoplasm of upper-inner quadrant of right breast in female, estrogen receptor positive ([place]) 07/28/2016  . Gastric ulcer requiring drug therapy, chronic 01/23/2016  . MI (mitral incompetence) 07/22/2015  . Combined fat and carbohydrate induced hyperlipemia 12/26/2014  . UTI (lower urinary tract infection) 08/18/2014  . Blurry vision 08/18/2014  . Allergic rhinitis 07/12/2014  . Absolute anemia 07/12/2014  . Baker's cyst of knee 07/12/2014  . Atherosclerosis of coronary artery 07/12/2014  . CAFL (chronic airflow limitation) ([place]) 07/12/2014  . Dizziness 07/12/2014  . Acid reflux 07/12/2014  . 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 History of colon polyps 07/12/2014  . Hypercholesteremia 07/12/2014  . Malaise and fatigue 07/12/2014  . Heart attack ([place]) 07/12/2014  . Muscle ache 07/12/2014  . Arthritis, degenerative 07/12/2014  . Bradycardia 08/23/2013  . CAD (coronary artery disease) 08/17/2013  . Diabetes ([place]) 08/17/2013  .  Diabetes mellitus ([place]) 08/17/2013  . Peripheral vascular disease ([place]) 08/17/2013    Past Surgical History:  Procedure Laterality Date  . ABDOMINAL HYSTERECTOMY    . APPENDECTOMY    . BREAST BIOPSY Right 07/23/2016   INVASIVE MAMMARY CARCINOMA WITH AREAS OF EXTRACELLULAR MUCIN  . BREAST EXCISIONAL BIOPSY     INVASIVE MUCINOUS MAMMARY CARCINOMA.   Marland Kitchen  BREAST LUMPECTOMY Right 08/20/2016   INVASIVE MUCINOUS MAMMARY CARCINOMA. /Grade 2   . COLONOSCOPY  2016  . COLONOSCOPY N/A 09/06/2018   Procedure: COLONOSCOPY;  Surgeon: Virgel Manifold, MD;  Location: [facility] ENDOSCOPY;  Service: Endoscopy;  Laterality: N/A;  . CORONARY ANGIOPLASTY WITH STENT PLACEMENT    . CYSTOSCOPY    . CYSTOSCOPY WITH URETHRAL DILATATION N/A 06/12/2019   Procedure: CYSTOSCOPY WITH URETHRAL DILATATION;  Surgeon: Abbie Sons, MD;  Location: [facility] ORS;  Service: Urology;  Laterality: N/A;  . ESOPHAGOGASTRODUODENOSCOPY N/A 09/06/2018   Procedure: ESOPHAGOGASTRODUODENOSCOPY (EGD);  Surgeon: Virgel Manifold, MD;  Location: [facility] ENDOSCOPY;  Service: Endoscopy;  Laterality: N/A;  . OOPHORECTOMY    . PARTIAL MASTECTOMY WITH AXILLARY SENTINEL LYMPH NODE BIOPSY Right 08/09/2016   Procedure: PARTIAL MASTECTOMY WITH AXILLARY SENTINEL LYMPH NODE BIOPSY;  Surgeon: Robert Bellow, MD;  Location: [facility] ORS;  Service: General;  Laterality: Right;  . TONSILLECTOMY      Prior to Admission medications   Medication Sig Start Date End Date Taking? Authorizing Provider  Calcium Carb-Cholecalciferol (CALCIUM 500+D PO) Take by mouth every other day.    [provider]  lisinopril (ZESTRIL) 20 MG tablet Take 20 mg by mouth daily. 05/29/19   [provider]  omeprazole (PRILOSEC) 20 MG capsule TAKE 1 CAPSULE (20 MG TOTAL) BY MOUTH DAILY AS NEEDED (INDIGESTION). 06/12/19   Jerrol Banana., MD    Allergies Celebrex  [celecoxib], Penicillins, and Prevacid [lansoprazole]  Family History  Problem Relation Age of Onset  . Hyperlipidemia Mother   . Allergies Mother   . Cerebral aneurysm Mother        cause of death at age 23  . Heart disease Father        Fatal MI ag 1  . Cancer Brother        lung cancer  . Hyperlipidemia Brother   . Colonic polyp Brother   . Healthy Son   . Cancer - Lung Brother        colon  . Healthy Son   . Breast cancer Neg Hx      Social History Social History   Tobacco Use  . Smoking status: Former Smoker    Packs/day: 0.25    Years: 10.00    Pack years: 2.50    Types: Cigarettes    Quit date: 03/06/1998    Years since quitting: 21.3  . Smokeless tobacco: Never Used  Substance Use Topics  . Alcohol use: No  . Drug use: No    Review of Systems  Review of Systems  Constitutional: Positive for fatigue. Negative for fever.  HENT: Negative for congestion and sore throat.   Eyes: Negative for visual disturbance.  Respiratory: Negative for cough and shortness of breath.   Cardiovascular: Negative for chest pain.  Gastrointestinal: Negative for abdominal pain, diarrhea, nausea and vomiting.  Genitourinary: Positive for pelvic pain. Negative for flank pain.  Musculoskeletal: Negative for back pain and neck pain.  Skin: Negative for rash and wound.  Neurological: Positive for syncope and weakness.  All other systems reviewed and are negative.  ____________________________________________  PHYSICAL EXAM:      VITAL SIGNS: ED Triage Vitals  Enc Vitals Group     BP 06/27/19 1144 (!) 99/57     Pulse Rate 06/27/19 1144 73     Resp 06/27/19 1144 17     Temp 06/27/19 1144 98.6 F (37 C)     Temp Source 06/27/19 1144 Oral     SpO2 06/27/19 1143 97 %     Weight 06/27/19 1144 130 lb (59 kg)     Height 06/27/19 1144 5\' 4"  (1.626 m)     Head Circumference --      Peak Flow --      Pain Score 06/27/19 1144 0     Pain Loc --      Pain Edu? --      Excl. in [place]? --      Physical Exam Vitals and nursing note reviewed.  Constitutional:      General: She is not in acute distress.    Appearance: She is well-developed.  HENT:     Head: Normocephalic and atraumatic.     Mouth/Throat:     Mouth: Mucous membranes are dry.  Eyes:     Conjunctiva/sclera: Conjunctivae normal.  Cardiovascular:     Rate and Rhythm: Normal rate and regular rhythm.     Heart sounds: Normal heart sounds.  Pulmonary:      Effort: Pulmonary effort is normal. No respiratory distress.     Breath sounds: No wheezing.  Abdominal:     General: There is no distension.  Musculoskeletal:     Cervical back: Neck supple.  Skin:    General: Skin is warm.     Capillary Refill: Capillary refill takes less than 2 seconds.     Findings: No rash.  Neurological:     Mental Status: She is alert and oriented to person, place, and time.     Motor: No abnormal muscle tone.       ____________________________________________   LABS (all labs ordered are listed, but only abnormal results are displayed)  Labs Reviewed  CBC WITH DIFFERENTIAL/PLATELET - Abnormal; Notable for the following components:      Result Value   RBC 3.69 (*)    Hemoglobin 10.0 (*)    HCT 31.8 (*)    RDW 21.2 (*)    All other components within normal limits  COMPREHENSIVE METABOLIC PANEL - Abnormal; Notable for the following components:   Glucose, Bld 104 (*)    BUN 28 (*)    Creatinine, Ser 1.44 (*)    Total Protein 6.4 (*)    Albumin 3.3 (*)    GFR calc non Af Amer 34 (*)    GFR calc Af Amer 40 (*)    All other components within normal limits  URINALYSIS, COMPLETE (UACMP) WITH MICROSCOPIC - Abnormal; Notable for the following components:   Color, Urine YELLOW (*)    APPearance CLOUDY (*)    Hgb urine dipstick SMALL (*)    Protein, ur 100 (*)    Leukocytes,Ua LARGE (*)    WBC, UA >50 (*)    Bacteria, UA MANY (*)    All other components within normal limits  TROPONIN I (HIGH SENSITIVITY)  TROPONIN I (HIGH SENSITIVITY)    ____________________________________________  EKG: Normal sinus rhythm, ventricular rate 72.  QRS 82, QTc 452.  No acute ST elevations or depressions. ________________________________________  RADIOLOGY All imaging, including plain films, CT scans, and ultrasounds, independently reviewed by me, and interpretations confirmed  via formal radiology reads.  ED MD interpretation:   None  Official radiology  report(s): No results found.  ____________________________________________  PROCEDURES   Procedure(s) performed (including Critical Care):  .Critical Care Performed by: Duffy Bruce, MD Authorized by: Duffy Bruce, MD   Critical care provider statement:    Critical care time (minutes):  35   Critical care time was exclusive of:  Separately billable procedures and treating other patients and teaching time   Critical care was necessary to treat or prevent imminent or life-threatening deterioration of the following conditions:  Cardiac failure, respiratory failure and circulatory failure   Critical care was time spent personally by me on the following activities:  Development of treatment plan with patient or surrogate, discussions with consultants, evaluation of patient's response to treatment, examination of patient, obtaining history from patient or surrogate, ordering and performing treatments and interventions, ordering and review of laboratory studies, ordering and review of radiographic studies, pulse oximetry, re-evaluation of patient's condition and review of old charts   I assumed direction of critical care for this patient from another provider in my specialty: no      ____________________________________________  INITIAL IMPRESSION / MDM / [place] / ED COURSE  As part of my medical decision making, I reviewed the following data within the [place] notes reviewed and incorporated, Old chart reviewed, Notes from prior ED visits, and [place] Controlled Substance Database       *Michiko B Blank was evaluated in Emergency Department on 06/27/2019 for the symptoms described in the history of present illness. She was evaluated in the context of the global COVID-19 pandemic, which necessitated consideration that the patient might be at risk for infection with the SARS-CoV-2 virus that causes COVID-19. Institutional protocols and algorithms  that pertain to the evaluation of patients at risk for COVID-19 are in a state of rapid change based on information released by regulatory bodies including the CDC and federal and state organizations. These policies and algorithms were followed during the patient's care in the ED.  Some ED evaluations and interventions may be delayed as a result of limited staffing during the pandemic.*     Medical Decision Making: 79 year old female here with syncopal episode x2 while going to urology appointment.  Patient has a fairly complex recent history including severe obstructive AKI requiring Foley catheter placement due to urethral stricture.  She just had a Foley placed yesterday and reportedly had some fairly significant urine out overnight.  She was mildly hypotensive on arrival here and appears clinically mildly dehydrated.  Her BUN and creatinine are slightly elevated.  Suspect she may have likely diuresed after placement of her catheter causing her to drop her pressure and pass out.  Of note, however, she also has had a brief run of PSVT in the ED.  Will admit for telemetry.  Discussed with her grandson, Sherren Mocha, who is in agreement.  ____________________________________________  FINAL CLINICAL IMPRESSION(S) / ED DIAGNOSES  Final diagnoses:  Syncope, unspecified syncope type  Paroxysmal supraventricular tachycardia ([place])     MEDICATIONS GIVEN DURING THIS VISIT:  Medications  sodium chloride 0.9 % bolus 1,000 mL (0 mLs Intravenous Stopped 06/27/19 1624)     ED Discharge Orders    None       Note:  This document was prepared using Dragon voice recognition software and may include unintentional dictation errors.   Duffy Bruce, MD 06/27/19 [phone]

## 2019-06-27 NOTE — ED Notes (Signed)
Patient is talking to her son right now

## 2019-06-28 ENCOUNTER — Other Ambulatory Visit: Payer: Self-pay

## 2019-06-28 DIAGNOSIS — I471 Supraventricular tachycardia: Secondary | ICD-10-CM | POA: Diagnosis not present

## 2019-06-28 DIAGNOSIS — I10 Essential (primary) hypertension: Secondary | ICD-10-CM | POA: Diagnosis not present

## 2019-06-28 DIAGNOSIS — R55 Syncope and collapse: Secondary | ICD-10-CM | POA: Diagnosis not present

## 2019-06-28 LAB — BASIC METABOLIC PANEL
Anion gap: 8 (ref 5–15)
BUN: 27 mg/dL — ABNORMAL HIGH (ref 8–23)
CO2: 25 mmol/L (ref 22–32)
Calcium: 8.9 mg/dL (ref 8.9–10.3)
Chloride: 109 mmol/L (ref 98–111)
Creatinine, Ser: 1.08 mg/dL — ABNORMAL HIGH (ref 0.44–1.00)
GFR calc Af Amer: 56 mL/min — ABNORMAL LOW (ref >60)
GFR calc non Af Amer: 48 mL/min — ABNORMAL LOW (ref >60)
Glucose, Bld: 80 mg/dL (ref 70–99)
Potassium: 4.1 mmol/L (ref 3.5–5.1)
Sodium: 142 mmol/L (ref 135–145)

## 2019-06-28 MED ORDER — CHLORHEXIDINE GLUCONATE CLOTH 2 % EX PADS
6.0000 | MEDICATED_PAD | Freq: Every day | CUTANEOUS | Status: DC
  Administered 2019-06-28: 6 via TOPICAL

## 2019-06-28 MED ORDER — SENNOSIDES-DOCUSATE SODIUM 8.6-50 MG PO TABS: 2.0000 | ORAL_TABLET | Freq: Two times a day (BID) | ORAL | 0 refills | Status: DC | PRN

## 2019-06-28 MED ORDER — LISINOPRIL 20 MG PO TABS
20.0000 mg | ORAL_TABLET | Freq: Every day | ORAL | Status: DC
  Administered 2019-06-28: 20 mg via ORAL
  Filled 2019-06-28: qty 1

## 2019-06-28 MED ORDER — ENOXAPARIN SODIUM 40 MG/0.4ML ~~LOC~~ SOLN
40.0000 mg | SUBCUTANEOUS | Status: DC
  Administered 2019-06-28: 40 mg via SUBCUTANEOUS
  Filled 2019-06-28: qty 0.4

## 2019-06-28 MED ORDER — SENNOSIDES-DOCUSATE SODIUM 8.6-50 MG PO TABS
2.0000 | ORAL_TABLET | Freq: Two times a day (BID) | ORAL | Status: DC
  Administered 2019-06-28: 2 via ORAL
  Filled 2019-06-28: qty 2

## 2019-06-28 NOTE — TOC Transition Note (Signed)
Transition of Care [facility]) - CM/SW Discharge Note   Patient Details  Name: [name] [name] MRN: [id number] Date of Birth: 02-Mar-1941  [facility]) CM/SW Contact:  Su Hilt, RN Phone Number: 06/28/2019, 2:04 PM   Clinical Narrative:    The patient to DC home with her family, Jackquline [place] is set up and she has DME at home, no additional needs   Final next level of care: [place] Barriers to Discharge: Continued Medical Work up   Patient Goals and CMS Choice Patient states their goals for this hospitalization and ongoing recovery are:: go home      Discharge Placement                       Discharge Plan and Services   Discharge Planning Services: CM Consult                      HH Arranged: PT [facility] Agency: [facility] Date [place]: 06/28/19 Time [place]: [id number] Representative spoke with at [place]: [place] ([place]) Interventions     Readmission Risk Interventions No flowsheet data found.

## 2019-06-28 NOTE — Progress Notes (Signed)
Discharge Note: Reviewed med list/discharge instructions. Pt verbalized/demonstrated understanding, D/C with 20 Fr. Urinary catheter.  Obtained vitals.Staff wheeled patient out. Transported via private vehicle to home.

## 2019-06-28 NOTE — Plan of Care (Signed)

## 2019-06-28 NOTE — Discharge Summary (Signed)
Physician Discharge Summary  Patient ID: [name] [name] MRN: [id number] DOB/AGE: [birth year] 79 y.o.  Admit date: 06/27/2019 Discharge date: 06/28/2019  Admission Diagnoses:  Discharge Diagnoses:  Active Problems:   Syncope   Discharged Condition: good  Hospital Course:  Patient is a 79 year old female with multiple medical problems including breast cancer, coronary artery disease, essential hypertension, who presents to the hospital after syncope episode. Patient is quite healthy with her age.  She was in the hospital earlier this month with acute renal failure secondary to urethral obstruction.  She was seen by urology, her renal function had recovered at time of discharge.  She was able to urinate on her own at that time.  She went to the urologist office yesterday for checkup, was found to have a significant urinary retention.  A Foley catheter was anchored.  Patient was sent home afterwards.  Patient had an episode of syncope today while sitting in his grandson's car.  She did not have any dizziness or headache prior to syncope episode, no nausea vomiting.  She just simply dozed off in the car.  Her grandson was unable to wake her up.  She was brought to the hospital.  She improved pretty quickly.  She did not have any symptoms afterwards. In the emergency room, she recorded a short run of PSVT (8 beats).  Patient did not have any symptoms at that time.  Patient was monitored overnight.  He did not have any symptoms in the hospital.  Her renal function has improved today.  Telemetry did not show additional arrhythmia.  She is medically stable to be discharged.  #1.  Syncope and collapse. No recurrence.  Appear to be secondary to vasovagal reaction due to post obstruction diuresis.  #2.  Short run of PSVT. No recurrence.  Follow-up with her family doctor in the near future.  #3. essential hypertension. Continue home medicines.  Consults: None  Significant Diagnostic Studies:    Treatments: Tele monitor  Discharge Exam: Blood pressure (!) 148/67, pulse 73, temperature 97.6 F (36.4 C), resp. rate 18, height 5\' 4"  (1.626 m), weight 59 kg, SpO2 100 %. General appearance: alert, cooperative and no distress Resp: clear to auscultation bilaterally Cardio: regular rate and rhythm, S1, S2 normal, no murmur, click, rub or gallop GI: soft, non-tender; bowel sounds normal; no masses,  no organomegaly Extremities: extremities normal, atraumatic, no cyanosis or edema Neurologic: Grossly normal  Disposition: Discharge disposition: 01-Home or Self Care       Discharge Instructions    Diet - low sodium heart healthy   Complete by: As directed    Increase activity slowly   Complete by: As directed      Allergies as of 06/28/2019      Reactions   Celebrex  [celecoxib]    Dark stools.   Penicillins Other (See Comments)   Childhood reaction Has patient had a PCN reaction causing immediate rash, facial/tongue/throat swelling, SOB or lightheadedness with hypotension: Unknown Has patient had a PCN reaction causing severe rash involving mucus membranes or skin necrosis: Unknown Has patient had a PCN reaction that required hospitalization: Unknown Has patient had a PCN reaction occurring within the last 10 years: Unknown If all of the above answers are "NO", then may proceed with Cephalosporin use.   Prevacid [lansoprazole] Diarrhea      Medication List    TAKE these medications   CALCIUM 500+D PO Take by mouth every other day.   letrozole 2.5 MG tablet Commonly known as: [facility]  Take 2.5 mg by mouth daily.   lisinopril 20 MG tablet Commonly known as: ZESTRIL Take 20 mg by mouth daily.   omeprazole 20 MG capsule Commonly known as: PRILOSEC TAKE 1 CAPSULE (20 MG TOTAL) BY MOUTH DAILY AS NEEDED (INDIGESTION).   senna-docusate 8.6-50 MG tablet Commonly known as: Senokot-S Take 2 tablets by mouth 2 (two) times daily as needed for mild constipation.         Signed: Sharen Hones 06/28/2019, 1:17 PM

## 2019-06-28 NOTE — TOC Initial Note (Signed)
Transition of Care [facility]) - Initial/Assessment Note    Patient Details  Name: [name] [name] MRN: [id number] Date of Birth: [birth year]  Transition [facility] Care [facility]) CM/SW Contact:    Su Hilt, RN Phone Number: 06/28/2019, 12:35 PM  Clinical Narrative:                  Met with the patient in the room and discussed DC plan and needs, She is set up with [facility] for [facility] services at home and will continue with them, she has a RW and raised toilet at home  She lives with her husband and son and they provide transportation She is up to date with her PCP and can afford her medications  Expected Discharge Plan: [place] Barriers to Discharge: Continued Medical Work up   Patient Goals and CMS Choice Patient states their goals for this hospitalization and ongoing recovery are:: go home      Expected Discharge Plan and Services Expected Discharge Plan: [place]   Discharge Planning Services: CM Consult   Living arrangements for the past 2 months: Single Family Home                           HH Arranged: PT [facility] Agency: [facility] Date [place]: 06/28/19 Time [place]: 33 Representative spoke with at [place]: [place] Arrangements/Services Living arrangements for the past 2 months: [place] with:: Spouse, Adult Children Patient language and need for interpreter reviewed:: Yes Do you feel safe going back to the place where you live?: Yes      Need for Family Participation in Patient Care: Yes (Comment) Care giver support system in place?: Yes (comment) Current home services: DME(rolling walker) Criminal Activity/Legal Involvement Pertinent to Current Situation/Hospitalization: No - Comment as needed  Activities of Daily Living Home Assistive Devices/Equipment: None ADL Screening (condition at time of admission) Patient's cognitive ability adequate to safely complete  daily activities?: Yes Is the patient deaf or have difficulty hearing?: No Does the patient have difficulty seeing, even when wearing glasses/contacts?: No Does the patient have difficulty concentrating, remembering, or making decisions?: Yes Patient able to express need for assistance with ADLs?: Yes Does the patient have difficulty dressing or bathing?: No Independently performs ADLs?: Yes (appropriate for developmental age) Does the patient have difficulty walking or climbing stairs?: Yes(bad right knee) Weakness of Legs: Right Weakness of Arms/Hands: None  Permission Sought/Granted   Permission granted to share information with : Yes, Verbal Permission Granted              Emotional Assessment Appearance:: Appears stated age Attitude/Demeanor/Rapport: Engaged Affect (typically observed): Appropriate Orientation: : Oriented to Place, Oriented to Self, Oriented to  Time, Oriented to Situation Alcohol / Substance Use: Not Applicable Psych Involvement: No (comment)  Admission diagnosis:  Paroxysmal supraventricular tachycardia ([place]) [I47.1] Syncope [R55] Syncope, unspecified syncope type [R55] Patient Active Problem List   Diagnosis Date Noted  . Syncope 06/27/2019  . History of breast cancer 06/12/2019  . Acute renal failure ([place]) 06/12/2019  . Generalized weakness 06/12/2019  . Hyperkalemia 06/12/2019  . Hypertensive urgency 06/12/2019  . Disorder of bursae of shoulder region 10/20/2018  . Lower GI bleeding 09/10/2018  . Hiatal hernia   . Melena   . Polyp of ascending colon   . Diverticulosis of large intestine without diverticulitis   . GIB (gastrointestinal  bleeding) 09/05/2018  . LVH (left ventricular hypertrophy) due to hypertensive disease, without heart failure 04/10/2018  . Chest pain 03/31/2018  . Vasovagal syncope 03/20/2018  . Lichen planus 61/[id number]/4353  . Malignant neoplasm of upper-inner quadrant of right breast in female, estrogen receptor positive ([place])  07/28/2016  . Gastric ulcer requiring drug therapy, chronic 01/23/2016  . MI (mitral incompetence) 07/22/2015  . Combined fat and carbohydrate induced hyperlipemia 12/26/2014  . UTI (lower urinary tract infection) 08/18/2014  . Blurry vision 08/18/2014  . Allergic rhinitis 07/12/2014  . Absolute anemia 07/12/2014  . Baker's cyst of knee 07/12/2014  . Atherosclerosis of coronary artery 07/12/2014  . CAFL (chronic airflow limitation) ([place]) 07/12/2014  . Dizziness 07/12/2014  . Acid reflux 07/12/2014  . 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 History of colon polyps 07/12/2014  . Hypercholesteremia 07/12/2014  . Malaise and fatigue 07/12/2014  . Heart attack ([place]) 07/12/2014  . Muscle ache 07/12/2014  . Arthritis, degenerative 07/12/2014  . Bradycardia 08/23/2013  . CAD (coronary artery disease) 08/17/2013  . Diabetes ([place]) 08/17/2013  . Diabetes mellitus ([place]) 08/17/2013  . Peripheral vascular disease ([place]) 08/17/2013   PCP:  Jerrol Banana., MD Pharmacy:   [id number] [id number]: [phone]: [phone] [place] [id number]: [phone]: [phone]    Social Determinants of Health (SDOH) Interventions    Readmission Risk Interventions No flowsheet data found.

## 2019-06-30 ENCOUNTER — Emergency Department
Admission: EM | Admit: 2019-06-30 | Discharge: 2019-06-30 | Disposition: A | Discharge status: Home / Self Care | Payer: PPO | Attending: Emergency Medicine | Admitting: Emergency Medicine

## 2019-06-30 ENCOUNTER — Other Ambulatory Visit: Payer: Self-pay

## 2019-06-30 ENCOUNTER — Emergency Department: Payer: PPO

## 2019-06-30 DIAGNOSIS — R55 Syncope and collapse: Secondary | ICD-10-CM | POA: Diagnosis not present

## 2019-06-30 DIAGNOSIS — Z79899 Other long term (current) drug therapy: Secondary | ICD-10-CM | POA: Insufficient documentation

## 2019-06-30 DIAGNOSIS — I10 Essential (primary) hypertension: Secondary | ICD-10-CM | POA: Diagnosis not present

## 2019-06-30 DIAGNOSIS — R531 Weakness: Secondary | ICD-10-CM | POA: Insufficient documentation

## 2019-06-30 DIAGNOSIS — R4020 Unspecified coma: Secondary | ICD-10-CM | POA: Diagnosis not present

## 2019-06-30 DIAGNOSIS — K449 Diaphragmatic hernia without obstruction or gangrene: Secondary | ICD-10-CM | POA: Diagnosis not present

## 2019-06-30 DIAGNOSIS — I2510 Atherosclerotic heart disease of native coronary artery without angina pectoris: Secondary | ICD-10-CM | POA: Insufficient documentation

## 2019-06-30 DIAGNOSIS — Z87891 Personal history of nicotine dependence: Secondary | ICD-10-CM | POA: Insufficient documentation

## 2019-06-30 LAB — CBC WITH DIFFERENTIAL/PLATELET
Abs Immature Granulocytes: 0.04 10*3/uL (ref 0.00–0.07)
Basophils Absolute: 0.1 10*3/uL (ref 0.0–0.1)
Basophils Relative: 1 %
Eosinophils Absolute: 0 10*3/uL (ref 0.0–0.5)
Eosinophils Relative: 0 %
HCT: 32.2 % — ABNORMAL LOW (ref 36.0–46.0)
Hemoglobin: 10.1 g/dL — ABNORMAL LOW (ref 12.0–15.0)
Immature Granulocytes: 0 %
Lymphocytes Relative: 11 %
Lymphs Abs: 1 10*3/uL (ref 0.7–4.0)
MCH: 27.3 pg (ref 26.0–34.0)
MCHC: 31.4 g/dL (ref 30.0–36.0)
MCV: 87 fL (ref 80.0–100.0)
Monocytes Absolute: 0.9 10*3/uL (ref 0.1–1.0)
Monocytes Relative: 9 %
Neutro Abs: 7.6 10*3/uL (ref 1.7–7.7)
Neutrophils Relative %: 79 %
Platelets: 211 10*3/uL (ref 150–400)
RBC: 3.7 MIL/uL — ABNORMAL LOW (ref 3.87–5.11)
RDW: 20.3 % — ABNORMAL HIGH (ref 11.5–15.5)
WBC: 9.6 10*3/uL (ref 4.0–10.5)
nRBC: 0 % (ref 0.0–0.2)

## 2019-06-30 LAB — COMPREHENSIVE METABOLIC PANEL
ALT: 10 U/L (ref 0–44)
AST: 16 U/L (ref 15–41)
Albumin: 3.3 g/dL — ABNORMAL LOW (ref 3.5–5.0)
Alkaline Phosphatase: 48 U/L (ref 38–126)
Anion gap: 9 (ref 5–15)
BUN: 18 mg/dL (ref 8–23)
CO2: 25 mmol/L (ref 22–32)
Calcium: 8.6 mg/dL — ABNORMAL LOW (ref 8.9–10.3)
Chloride: 103 mmol/L (ref 98–111)
Creatinine, Ser: 1.16 mg/dL — ABNORMAL HIGH (ref 0.44–1.00)
GFR calc Af Amer: 51 mL/min — ABNORMAL LOW (ref >60)
GFR calc non Af Amer: 44 mL/min — ABNORMAL LOW (ref >60)
Glucose, Bld: 115 mg/dL — ABNORMAL HIGH (ref 70–99)
Potassium: 4.1 mmol/L (ref 3.5–5.1)
Sodium: 137 mmol/L (ref 135–145)
Total Bilirubin: 1.3 mg/dL — ABNORMAL HIGH (ref 0.3–1.2)
Total Protein: 6.7 g/dL (ref 6.5–8.1)

## 2019-06-30 LAB — URINALYSIS, COMPLETE (UACMP) WITH MICROSCOPIC
Bacteria, UA: NONE SEEN
Bilirubin Urine: NEGATIVE
Glucose, UA: NEGATIVE mg/dL
Ketones, ur: NEGATIVE mg/dL
Nitrite: NEGATIVE
Protein, ur: 30 mg/dL — AB
Specific Gravity, Urine: 1.015 (ref 1.005–1.030)
WBC, UA: >50 WBC/hpf (ref 0–5)
pH: 7 (ref 5.0–8.0)

## 2019-06-30 LAB — TROPONIN I (HIGH SENSITIVITY)
Troponin I (High Sensitivity): 7 ng/L (ref <18)
Troponin I (High Sensitivity): 8 ng/L (ref <18)

## 2019-06-30 NOTE — ED Provider Notes (Signed)
[facility] Emergency Department Provider Note   ____________________________________________   First MD Initiated Contact with Patient 06/30/19 1112     (approximate)  I have reviewed the triage vital signs and the nursing notes.   HISTORY  Chief Complaint Weakness    HPI [name] [name] is a 79 y.o. female with possible history of CAD, gross cancer, and hypertension who presents to the ED complaining of generalized weakness and lightheadedness.  Patient reports that she has been feeling generally weak ever since she woke up this morning and had a hard time waking up.  She felt very lightheaded when attempting to get up out of bed and felt like she might pass out.  The lightheadedness seems to have resolved, but she continues to feel generally weak.  She denies falling or hitting her head, has otherwise been feeling well recently with no fevers, cough, chest pain, shortness of breath, dysuria, or hematuria.  She was recently admitted for a syncopal episode and had unremarkable work-up at that time.  She has also had issues with urinary retention recently and now has a chronic indwelling Foley catheter.        Past Medical History:  Diagnosis Date  . Breast cancer ([place]) 2018   Right breast  . 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 Mucin noted on biopsy, not on wide excision.  . Coronary artery disease   . GERD (gastroesophageal reflux disease)   . Hyperlipidemia   . Hypertension   . 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 Dr. Kellie Moor DX by punch bioppsy  . Lichen planus    right breast   . Lichen planus   . MI (myocardial infarction) ([place])    2015  . Personal history of radiation therapy 09/2016   RIGHT lumpectomy w/ radiation    Patient Active Problem List   Diagnosis Date Noted  . Syncope 06/27/2019  . History of breast cancer 06/12/2019  . Acute  renal failure ([place]) 06/12/2019  . Generalized weakness 06/12/2019  . Hyperkalemia 06/12/2019  . Hypertensive urgency 06/12/2019  . Disorder of bursae of shoulder region 10/20/2018  . Lower GI bleeding 09/10/2018  . Hiatal hernia   . Melena   . Polyp of ascending colon   . Diverticulosis of large intestine without diverticulitis   . GIB (gastrointestinal bleeding) 09/05/2018  . LVH (left ventricular hypertrophy) due to hypertensive disease, without heart failure 04/10/2018  . Chest pain 03/31/2018  . Vasovagal syncope 03/20/2018  . Lichen planus [id number]  . Malignant neoplasm of upper-inner quadrant of right breast in female, estrogen receptor positive ([place]) 07/28/2016  . Gastric ulcer requiring drug therapy, chronic 01/23/2016  . MI (mitral incompetence) 07/22/2015  . Combined fat and carbohydrate induced hyperlipemia 12/26/2014  . UTI (lower urinary tract infection) 08/18/2014  . Blurry vision 08/18/2014  . Allergic rhinitis 07/12/2014  . Absolute anemia 07/12/2014  . Baker's cyst of knee 07/12/2014  . Atherosclerosis of coronary artery 07/12/2014  . CAFL (chronic airflow limitation) ([place]) 07/12/2014  . Dizziness 07/12/2014  . Acid reflux 07/12/2014  . 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 History of colon polyps 07/12/2014  . Hypercholesteremia 07/12/2014  . Malaise and fatigue 07/12/2014  . Heart attack ([place]) 07/12/2014  . Muscle ache 07/12/2014  . Arthritis, degenerative 07/12/2014  . Bradycardia 08/23/2013  . CAD (coronary artery disease) 08/17/2013  . Diabetes ([place]) 08/17/2013  . Diabetes mellitus ([place]) 08/17/2013  .  Peripheral vascular disease ([place]) 08/17/2013    Past Surgical History:  Procedure Laterality Date  . ABDOMINAL HYSTERECTOMY    . APPENDECTOMY    . BREAST BIOPSY Right 07/23/2016   INVASIVE MAMMARY CARCINOMA WITH AREAS OF EXTRACELLULAR MUCIN  . BREAST EXCISIONAL BIOPSY     INVASIVE MUCINOUS MAMMARY CARCINOMA.   Marland Kitchen BREAST LUMPECTOMY Right 08/20/2016    INVASIVE MUCINOUS MAMMARY CARCINOMA. /Grade 2   . COLONOSCOPY  2016  . COLONOSCOPY N/A 09/06/2018   Procedure: COLONOSCOPY;  Surgeon: Virgel Manifold, MD;  Location: [facility] ENDOSCOPY;  Service: Endoscopy;  Laterality: N/A;  . CORONARY ANGIOPLASTY WITH STENT PLACEMENT    . CYSTOSCOPY    . CYSTOSCOPY WITH URETHRAL DILATATION N/A 06/12/2019   Procedure: CYSTOSCOPY WITH URETHRAL DILATATION;  Surgeon: Abbie Sons, MD;  Location: [facility] ORS;  Service: Urology;  Laterality: N/A;  . ESOPHAGOGASTRODUODENOSCOPY N/A 09/06/2018   Procedure: ESOPHAGOGASTRODUODENOSCOPY (EGD);  Surgeon: Virgel Manifold, MD;  Location: [facility] ENDOSCOPY;  Service: Endoscopy;  Laterality: N/A;  . OOPHORECTOMY    . PARTIAL MASTECTOMY WITH AXILLARY SENTINEL LYMPH NODE BIOPSY Right 08/09/2016   Procedure: PARTIAL MASTECTOMY WITH AXILLARY SENTINEL LYMPH NODE BIOPSY;  Surgeon: Robert Bellow, MD;  Location: [facility] ORS;  Service: General;  Laterality: Right;  . TONSILLECTOMY      Prior to Admission medications   Medication Sig Start Date End Date Taking? Authorizing Provider  Calcium Carb-Cholecalciferol (CALCIUM 500+D PO) Take by mouth every other day.    [provider]  letrozole (FEMARA) 2.5 MG tablet Take 2.5 mg by mouth daily.    [provider]  lisinopril (ZESTRIL) 20 MG tablet Take 20 mg by mouth daily. 05/29/19   [provider]  omeprazole (PRILOSEC) 20 MG capsule TAKE 1 CAPSULE (20 MG TOTAL) BY MOUTH DAILY AS NEEDED (INDIGESTION). 06/12/19   Jerrol Banana., MD  senna-docusate (SENOKOT-S) 8.6-50 MG tablet Take 2 tablets by mouth 2 (two) times daily as needed for mild constipation. 06/28/19   Sharen Hones, MD    Allergies Celebrex  [celecoxib], Penicillins, and Prevacid [lansoprazole]  Family History  Problem Relation Age of Onset  . Hyperlipidemia Mother   . Allergies Mother   . Cerebral aneurysm Mother        cause of death at age 73  . Heart disease Father        Fatal MI ag  48  . Cancer Brother        lung cancer  . Hyperlipidemia Brother   . Colonic polyp Brother   . Healthy Son   . Cancer - Lung Brother        colon  . Healthy Son   . Breast cancer Neg Hx     Social History Social History   Tobacco Use  . Smoking status: Former Smoker    Packs/day: 0.25    Years: 10.00    Pack years: 2.50    Types: Cigarettes    Quit date: 03/06/1998    Years since quitting: 21.3  . Smokeless tobacco: Never Used  Substance Use Topics  . Alcohol use: No  . Drug use: No    Review of Systems  Constitutional: No fever/chills.  Positive for generalized weakness and lightheadedness. Eyes: No visual changes. ENT: No sore throat. Cardiovascular: Denies chest pain. Respiratory: Denies shortness of breath. Gastrointestinal: No abdominal pain.  No nausea, no vomiting.  No diarrhea.  No constipation. Genitourinary: Negative for dysuria. Musculoskeletal: Negative for back pain. Skin: Negative for rash. Neurological:  Negative for headaches, focal weakness or numbness.  ____________________________________________   PHYSICAL EXAM:  VITAL SIGNS: ED Triage Vitals [06/30/19 1109]  Enc Vitals Group     BP (!) 169/89     Pulse Rate 90     Resp 17     Temp 98.8 F (37.1 C)     Temp Source Oral     SpO2 100 %     Weight      Height      Head Circumference      Peak Flow      Pain Score 0     Pain Loc      Pain Edu?      Excl. in [place]?     Constitutional: Alert and oriented. Eyes: Conjunctivae are normal. Head: Atraumatic. Nose: No congestion/rhinnorhea. Mouth/Throat: Mucous membranes are moist. Neck: Normal ROM Cardiovascular: Normal rate, regular rhythm. Grossly normal heart sounds. Respiratory: Normal respiratory effort.  No retractions. Lungs CTAB. Gastrointestinal: Soft and nontender. No distention. Genitourinary: Chronic Foley with clear yellow urine draining. Musculoskeletal: No lower extremity tenderness nor edema. Neurologic:  Normal  speech and language. No gross focal neurologic deficits are appreciated. Skin:  Skin is warm, dry and intact. No rash noted. Psychiatric: Mood and affect are normal. Speech and behavior are normal.  ____________________________________________   LABS (all labs ordered are listed, but only abnormal results are displayed)  Labs Reviewed  URINALYSIS, COMPLETE (UACMP) WITH MICROSCOPIC - Abnormal; Notable for the following components:      Result Value   APPearance CLOUDY (*)    Hgb urine dipstick TRACE (*)    Protein, ur 30 (*)    Leukocytes,Ua MODERATE (*)    Non Squamous Epithelial PRESENT (*)    All other components within normal limits  CBC WITH DIFFERENTIAL/PLATELET - Abnormal; Notable for the following components:   RBC 3.70 (*)    Hemoglobin 10.1 (*)    HCT 32.2 (*)    RDW 20.3 (*)    All other components within normal limits  COMPREHENSIVE METABOLIC PANEL - Abnormal; Notable for the following components:   Glucose, Bld 115 (*)    Creatinine, Ser 1.16 (*)    Calcium 8.6 (*)    Albumin 3.3 (*)    Total Bilirubin 1.3 (*)    GFR calc non Af Amer 44 (*)    GFR calc Af Amer 51 (*)    All other components within normal limits  URINE CULTURE  TROPONIN I (HIGH SENSITIVITY)  TROPONIN I (HIGH SENSITIVITY)   ____________________________________________  EKG  ED ECG REPORT I, Blake Divine, the attending physician, personally viewed and interpreted this ECG.   Date: 06/30/2019  EKG Time: 11:12  Rate: 89  Rhythm: normal sinus rhythm  Axis: Normal  Intervals:none  ST&T Change: None   PROCEDURES  Procedure(s) performed (including Critical Care):  Procedures   ____________________________________________   INITIAL IMPRESSION / ASSESSMENT AND PLAN / ED COURSE       79 year old female presents to the ED for lightheadedness and generalized weakness ever since waking up this morning, she felt like she was going to pass out when she went to get up out of bed.  She  denies any chest pain or shortness of breath, EKG shows no evidence of arrhythmia or ischemia.  She is awake and alert here in the ED with no focal neurologic deficits, I doubt acute stroke.  She denies any symptoms to suggest UTI at this time, will send urine for culture but antibiotics not  currently indicated.  Lab work is unremarkable and she has remained in normal sinus rhythm here on the monitor.  She was recently admitted for syncope and had unremarkable work-up at that time.  I advised her and her husband to follow-up with PCP as well as cardiology for consideration of Holter monitor placement.  She was counseled to return to the ED for new worsening symptoms, patient agrees with plan.      ____________________________________________   FINAL CLINICAL IMPRESSION(S) / ED DIAGNOSES  Final diagnoses:  Weakness  Syncope, unspecified syncope type     ED Discharge Orders    None       Note:  This document was prepared using Dragon voice recognition software and may include unintentional dictation errors.   Blake Divine, MD 06/30/19 [phone]

## 2019-06-30 NOTE — ED Triage Notes (Signed)
Pt arrives [facility] from home for "not feeling well." pt states she had a hard time waking up this AM. Arrives in foley in place. A&O, talking in complete sentences. 212/108 FD 178/98 EMS, 181/87 No pain, arrives with 20 L AC.

## 2019-07-01 ENCOUNTER — Emergency Department (HOSPITAL_COMMUNITY): Payer: PPO

## 2019-07-01 ENCOUNTER — Encounter (HOSPITAL_COMMUNITY): Payer: Self-pay | Admitting: Internal Medicine

## 2019-07-01 ENCOUNTER — Inpatient Hospital Stay (HOSPITAL_COMMUNITY)
Admission: EM | Admit: 2019-07-01 | Discharge: 2019-07-12 | DRG: 698 | Disposition: A | Discharge status: Skilled Nursing Facility | Payer: PPO | Attending: Internal Medicine | Admitting: Internal Medicine

## 2019-07-01 ENCOUNTER — Other Ambulatory Visit: Payer: Self-pay

## 2019-07-01 DIAGNOSIS — K449 Diaphragmatic hernia without obstruction or gangrene: Secondary | ICD-10-CM | POA: Diagnosis not present

## 2019-07-01 DIAGNOSIS — B961 Klebsiella pneumoniae [K. pneumoniae] as the cause of diseases classified elsewhere: Secondary | ICD-10-CM | POA: Diagnosis not present

## 2019-07-01 DIAGNOSIS — R6520 Severe sepsis without septic shock: Secondary | ICD-10-CM | POA: Diagnosis present

## 2019-07-01 DIAGNOSIS — T83511A Infection and inflammatory reaction due to indwelling urethral catheter, initial encounter: Principal | ICD-10-CM | POA: Diagnosis present

## 2019-07-01 DIAGNOSIS — N1831 Chronic kidney disease, stage 3a: Secondary | ICD-10-CM | POA: Diagnosis not present

## 2019-07-01 DIAGNOSIS — C44501 Unspecified malignant neoplasm of skin of breast: Secondary | ICD-10-CM | POA: Diagnosis not present

## 2019-07-01 DIAGNOSIS — R55 Syncope and collapse: Secondary | ICD-10-CM

## 2019-07-01 DIAGNOSIS — F0390 Unspecified dementia without behavioral disturbance: Secondary | ICD-10-CM | POA: Diagnosis not present

## 2019-07-01 DIAGNOSIS — Z1611 Resistance to penicillins: Secondary | ICD-10-CM | POA: Diagnosis present

## 2019-07-01 DIAGNOSIS — Z83438 Family history of other disorder of lipoprotein metabolism and other lipidemia: Secondary | ICD-10-CM

## 2019-07-01 DIAGNOSIS — R7881 Bacteremia: Secondary | ICD-10-CM | POA: Diagnosis not present

## 2019-07-01 DIAGNOSIS — A419 Sepsis, unspecified organism: Secondary | ICD-10-CM | POA: Diagnosis not present

## 2019-07-01 DIAGNOSIS — I951 Orthostatic hypotension: Secondary | ICD-10-CM | POA: Diagnosis not present

## 2019-07-01 DIAGNOSIS — I10 Essential (primary) hypertension: Secondary | ICD-10-CM | POA: Diagnosis not present

## 2019-07-01 DIAGNOSIS — I361 Nonrheumatic tricuspid (valve) insufficiency: Secondary | ICD-10-CM | POA: Diagnosis not present

## 2019-07-01 DIAGNOSIS — E861 Hypovolemia: Secondary | ICD-10-CM | POA: Diagnosis present

## 2019-07-01 DIAGNOSIS — Z79899 Other long term (current) drug therapy: Secondary | ICD-10-CM

## 2019-07-01 DIAGNOSIS — E876 Hypokalemia: Secondary | ICD-10-CM | POA: Diagnosis not present

## 2019-07-01 DIAGNOSIS — Z170 Estrogen receptor positive status [ER+]: Secondary | ICD-10-CM | POA: Diagnosis not present

## 2019-07-01 DIAGNOSIS — Y846 Urinary catheterization as the cause of abnormal reaction of the patient, or of later complication, without mention of misadventure at the time of the procedure: Secondary | ICD-10-CM | POA: Diagnosis present

## 2019-07-01 DIAGNOSIS — K2101 Gastro-esophageal reflux disease with esophagitis, with bleeding: Secondary | ICD-10-CM | POA: Diagnosis not present

## 2019-07-01 DIAGNOSIS — Z03818 Encounter for observation for suspected exposure to other biological agents ruled out: Secondary | ICD-10-CM | POA: Diagnosis not present

## 2019-07-01 DIAGNOSIS — E872 Acidosis: Secondary | ICD-10-CM | POA: Diagnosis not present

## 2019-07-01 DIAGNOSIS — T83511D Infection and inflammatory reaction due to indwelling urethral catheter, subsequent encounter: Secondary | ICD-10-CM | POA: Diagnosis not present

## 2019-07-01 DIAGNOSIS — N390 Urinary tract infection, site not specified: Secondary | ICD-10-CM

## 2019-07-01 DIAGNOSIS — Z923 Personal history of irradiation: Secondary | ICD-10-CM | POA: Diagnosis not present

## 2019-07-01 DIAGNOSIS — R5381 Other malaise: Secondary | ICD-10-CM | POA: Diagnosis present

## 2019-07-01 DIAGNOSIS — I959 Hypotension, unspecified: Secondary | ICD-10-CM | POA: Diagnosis not present

## 2019-07-01 DIAGNOSIS — B9689 Other specified bacterial agents as the cause of diseases classified elsewhere: Secondary | ICD-10-CM | POA: Diagnosis not present

## 2019-07-01 DIAGNOSIS — Z79818 Long term (current) use of other agents affecting estrogen receptors and estrogen levels: Secondary | ICD-10-CM

## 2019-07-01 DIAGNOSIS — I2510 Atherosclerotic heart disease of native coronary artery without angina pectoris: Secondary | ICD-10-CM | POA: Diagnosis not present

## 2019-07-01 DIAGNOSIS — K219 Gastro-esophageal reflux disease without esophagitis: Secondary | ICD-10-CM | POA: Diagnosis present

## 2019-07-01 DIAGNOSIS — Z87891 Personal history of nicotine dependence: Secondary | ICD-10-CM | POA: Diagnosis not present

## 2019-07-01 DIAGNOSIS — N179 Acute kidney failure, unspecified: Secondary | ICD-10-CM | POA: Diagnosis present

## 2019-07-01 DIAGNOSIS — I129 Hypertensive chronic kidney disease with stage 1 through stage 4 chronic kidney disease, or unspecified chronic kidney disease: Secondary | ICD-10-CM | POA: Diagnosis present

## 2019-07-01 DIAGNOSIS — A4151 Sepsis due to Escherichia coli [E. coli]: Secondary | ICD-10-CM | POA: Diagnosis present

## 2019-07-01 DIAGNOSIS — I350 Nonrheumatic aortic (valve) stenosis: Secondary | ICD-10-CM | POA: Diagnosis not present

## 2019-07-01 DIAGNOSIS — Z8249 Family history of ischemic heart disease and other diseases of the circulatory system: Secondary | ICD-10-CM

## 2019-07-01 DIAGNOSIS — B9620 Unspecified Escherichia coli [E. coli] as the cause of diseases classified elsewhere: Secondary | ICD-10-CM | POA: Diagnosis not present

## 2019-07-01 DIAGNOSIS — Z20822 Contact with and (suspected) exposure to covid-19: Secondary | ICD-10-CM | POA: Diagnosis present

## 2019-07-01 DIAGNOSIS — M6281 Muscle weakness (generalized): Secondary | ICD-10-CM | POA: Diagnosis not present

## 2019-07-01 DIAGNOSIS — Z1624 Resistance to multiple antibiotics: Secondary | ICD-10-CM | POA: Diagnosis present

## 2019-07-01 DIAGNOSIS — N3090 Cystitis, unspecified without hematuria: Secondary | ICD-10-CM | POA: Diagnosis not present

## 2019-07-01 DIAGNOSIS — G9341 Metabolic encephalopathy: Secondary | ICD-10-CM | POA: Diagnosis not present

## 2019-07-01 DIAGNOSIS — Z853 Personal history of malignant neoplasm of breast: Secondary | ICD-10-CM

## 2019-07-01 DIAGNOSIS — Z955 Presence of coronary angioplasty implant and graft: Secondary | ICD-10-CM

## 2019-07-01 DIAGNOSIS — Z87898 Personal history of other specified conditions: Secondary | ICD-10-CM | POA: Diagnosis not present

## 2019-07-01 DIAGNOSIS — Z66 Do not resuscitate: Secondary | ICD-10-CM | POA: Diagnosis not present

## 2019-07-01 DIAGNOSIS — I252 Old myocardial infarction: Secondary | ICD-10-CM | POA: Diagnosis not present

## 2019-07-01 DIAGNOSIS — R4189 Other symptoms and signs involving cognitive functions and awareness: Secondary | ICD-10-CM | POA: Diagnosis present

## 2019-07-01 DIAGNOSIS — N368 Other specified disorders of urethra: Secondary | ICD-10-CM | POA: Diagnosis present

## 2019-07-01 DIAGNOSIS — R498 Other voice and resonance disorders: Secondary | ICD-10-CM | POA: Diagnosis not present

## 2019-07-01 DIAGNOSIS — E569 Vitamin deficiency, unspecified: Secondary | ICD-10-CM | POA: Diagnosis not present

## 2019-07-01 DIAGNOSIS — R4182 Altered mental status, unspecified: Secondary | ICD-10-CM | POA: Diagnosis not present

## 2019-07-01 DIAGNOSIS — Z801 Family history of malignant neoplasm of trachea, bronchus and lung: Secondary | ICD-10-CM

## 2019-07-01 DIAGNOSIS — R488 Other symbolic dysfunctions: Secondary | ICD-10-CM | POA: Diagnosis not present

## 2019-07-01 DIAGNOSIS — D65 Disseminated intravascular coagulation [defibrination syndrome]: Secondary | ICD-10-CM | POA: Diagnosis not present

## 2019-07-01 LAB — URINALYSIS, ROUTINE W REFLEX MICROSCOPIC
Bilirubin Urine: NEGATIVE
Glucose, UA: NEGATIVE mg/dL
Ketones, ur: NEGATIVE mg/dL
Nitrite: NEGATIVE
Protein, ur: 100 mg/dL — AB
RBC / HPF: >50 RBC/hpf — ABNORMAL HIGH (ref 0–5)
Specific Gravity, Urine: 1.011 (ref 1.005–1.030)
WBC, UA: >50 WBC/hpf — ABNORMAL HIGH (ref 0–5)
pH: 6 (ref 5.0–8.0)

## 2019-07-01 LAB — CBC WITH DIFFERENTIAL/PLATELET
Abs Immature Granulocytes: 0.05 10*3/uL (ref 0.00–0.07)
Basophils Absolute: 0.1 10*3/uL (ref 0.0–0.1)
Basophils Relative: 1 %
Eosinophils Absolute: 0 10*3/uL (ref 0.0–0.5)
Eosinophils Relative: 0 %
HCT: 34.6 % — ABNORMAL LOW (ref 36.0–46.0)
Hemoglobin: 10.4 g/dL — ABNORMAL LOW (ref 12.0–15.0)
Immature Granulocytes: 1 %
Lymphocytes Relative: 8 %
Lymphs Abs: 0.8 10*3/uL (ref 0.7–4.0)
MCH: 26.7 pg (ref 26.0–34.0)
MCHC: 30.1 g/dL (ref 30.0–36.0)
MCV: 88.9 fL (ref 80.0–100.0)
Monocytes Absolute: 0.9 10*3/uL (ref 0.1–1.0)
Monocytes Relative: 9 %
Neutro Abs: 7.4 10*3/uL (ref 1.7–7.7)
Neutrophils Relative %: 81 %
Platelets: 180 10*3/uL (ref 150–400)
RBC: 3.89 MIL/uL (ref 3.87–5.11)
RDW: 20 % — ABNORMAL HIGH (ref 11.5–15.5)
WBC: 9.2 10*3/uL (ref 4.0–10.5)
nRBC: 0 % (ref 0.0–0.2)

## 2019-07-01 LAB — COMPREHENSIVE METABOLIC PANEL
ALT: 15 U/L (ref 0–44)
AST: 36 U/L (ref 15–41)
Albumin: 2.8 g/dL — ABNORMAL LOW (ref 3.5–5.0)
Alkaline Phosphatase: 50 U/L (ref 38–126)
Anion gap: 11 (ref 5–15)
BUN: 16 mg/dL (ref 8–23)
CO2: 24 mmol/L (ref 22–32)
Calcium: 8.8 mg/dL — ABNORMAL LOW (ref 8.9–10.3)
Chloride: 102 mmol/L (ref 98–111)
Creatinine, Ser: 1.45 mg/dL — ABNORMAL HIGH (ref 0.44–1.00)
GFR calc Af Amer: 39 mL/min — ABNORMAL LOW (ref >60)
GFR calc non Af Amer: 34 mL/min — ABNORMAL LOW (ref >60)
Glucose, Bld: 142 mg/dL — ABNORMAL HIGH (ref 70–99)
Potassium: 3.7 mmol/L (ref 3.5–5.1)
Sodium: 137 mmol/L (ref 135–145)
Total Bilirubin: 1.1 mg/dL (ref 0.3–1.2)
Total Protein: 6.3 g/dL — ABNORMAL LOW (ref 6.5–8.1)

## 2019-07-01 LAB — LACTIC ACID, PLASMA
Lactic Acid, Venous: 2.2 mmol/L (ref 0.5–1.9)
Lactic Acid, Venous: 1.7 mmol/L (ref 0.5–1.9)
Lactic Acid, Venous: 1.4 mmol/L (ref 0.5–1.9)

## 2019-07-01 LAB — CBG MONITORING, ED: Glucose-Capillary: 122 mg/dL — ABNORMAL HIGH (ref 70–99)

## 2019-07-01 LAB — PROTIME-INR
INR: 1.1 (ref 0.8–1.2)
Prothrombin Time: 14.4 seconds (ref 11.4–15.2)

## 2019-07-01 LAB — SARS CORONAVIRUS 2 (TAT 6-24 HRS): SARS Coronavirus 2: NEGATIVE

## 2019-07-01 LAB — AMMONIA: Ammonia: 11 umol/L (ref 9–35)

## 2019-07-01 MED ORDER — LETROZOLE 2.5 MG PO TABS
2.5000 mg | ORAL_TABLET | Freq: Every day | ORAL | Status: DC
  Administered 2019-07-02 – 2019-07-12 (×11): 2.5 mg via ORAL
  Filled 2019-07-01 (×11): qty 1

## 2019-07-01 MED ORDER — SODIUM CHLORIDE 0.9 % IV SOLN: 1.0000 g | INTRAVENOUS | Status: DC

## 2019-07-01 MED ORDER — ACETAMINOPHEN 325 MG PO TABS
650.0000 mg | ORAL_TABLET | Freq: Three times a day (TID) | ORAL | Status: DC | PRN
  Administered 2019-07-02 – 2019-07-11 (×5): 650 mg via ORAL
  Filled 2019-07-01 (×5): qty 2

## 2019-07-01 MED ORDER — SODIUM CHLORIDE 0.9 % IV SOLN: 1.0000 g | Freq: Once | INTRAVENOUS | Status: DC

## 2019-07-01 MED ORDER — SODIUM CHLORIDE 0.9 % IV BOLUS
1000.0000 mL | Freq: Once | INTRAVENOUS | Status: AC
  Administered 2019-07-01: 13:00:00 1000 mL via INTRAVENOUS

## 2019-07-01 MED ORDER — SODIUM CHLORIDE 0.9 % IV BOLUS: 1000.0000 mL | Freq: Once | INTRAVENOUS | Status: DC

## 2019-07-01 MED ORDER — HYDRALAZINE HCL 25 MG PO TABS
25.0000 mg | ORAL_TABLET | Freq: Four times a day (QID) | ORAL | Status: DC | PRN
  Administered 2019-07-02: 25 mg via ORAL
  Filled 2019-07-01: qty 1

## 2019-07-01 MED ORDER — SODIUM CHLORIDE 0.9 % IV SOLN
1.0000 g | INTRAVENOUS | Status: DC
  Administered 2019-07-01: 1 g via INTRAVENOUS
  Filled 2019-07-01 (×2): qty 10

## 2019-07-01 MED ORDER — SENNOSIDES-DOCUSATE SODIUM 8.6-50 MG PO TABS
2.0000 | ORAL_TABLET | Freq: Two times a day (BID) | ORAL | Status: DC | PRN
  Administered 2019-07-07: 2 via ORAL
  Filled 2019-07-01: qty 2

## 2019-07-01 MED ORDER — LACTATED RINGERS IV BOLUS: 1000.0000 mL | Freq: Once | INTRAVENOUS | Status: DC

## 2019-07-01 MED ORDER — PANTOPRAZOLE SODIUM 40 MG PO TBEC
40.0000 mg | DELAYED_RELEASE_TABLET | Freq: Every day | ORAL | Status: DC
  Administered 2019-07-02 – 2019-07-12 (×11): 40 mg via ORAL
  Filled 2019-07-01 (×12): qty 1

## 2019-07-01 MED ORDER — HEPARIN SODIUM (PORCINE) 5000 UNIT/ML IJ SOLN
5000.0000 [IU] | Freq: Two times a day (BID) | INTRAMUSCULAR | Status: DC
  Administered 2019-07-01 – 2019-07-02 (×2): 5000 [IU] via SUBCUTANEOUS
  Filled 2019-07-01 (×2): qty 1

## 2019-07-01 MED ORDER — CALCIUM CARBONATE-VITAMIN D 500-200 MG-UNIT PO TABS
1.0000 | ORAL_TABLET | ORAL | Status: DC
  Administered 2019-07-03 – 2019-07-11 (×5): 1 via ORAL
  Filled 2019-07-01 (×10): qty 1

## 2019-07-01 NOTE — ED Provider Notes (Signed)
[facility] EMERGENCY DEPARTMENT Provider Note   CSN: [id number] Arrival date & time: 07/01/19  1209     History No chief complaint on file.   [name] [name] is a 79 y.o. female.  Pt presents to the ED today with a syncopal episode.  The pt has had several of these "spells."  Pt was admitted to [facility] from 4/21-22 for a syncopal obs.  Work up ok other than 1 run of psvt (8 beats) in the ED.  Pt went back to [place] yesterday for the same.  Work up ok, so she was d/c home.  Pt came to the ED today because it happened again.  Pt had another episode in the waiting room after she was triaged.        Past Medical History:  Diagnosis Date  . Breast cancer ([place]) 2018   Right breast  . 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 Mucin noted on biopsy, not on wide excision.  . Coronary artery disease   . GERD (gastroesophageal reflux disease)   . Hyperlipidemia   . Hypertension   . 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 Dr. Kellie Moor DX by punch bioppsy  . Lichen planus    right breast   . Lichen planus   . MI (myocardial infarction) ([place])    2015  . Personal history of radiation therapy 09/2016   RIGHT lumpectomy w/ radiation    Patient Active Problem List   Diagnosis Date Noted  . Syncope 06/27/2019  . History of breast cancer 06/12/2019  . Acute renal failure ([place]) 06/12/2019  . Generalized weakness 06/12/2019  . Hyperkalemia 06/12/2019  . Hypertensive urgency 06/12/2019  . Disorder of bursae of shoulder region 10/20/2018  . Lower GI bleeding 09/10/2018  . Hiatal hernia   . Melena   . Polyp of ascending colon   . Diverticulosis of large intestine without diverticulitis   . GIB (gastrointestinal bleeding) 09/05/2018  . LVH (left ventricular hypertrophy) due to hypertensive disease, without heart failure 04/10/2018  . Chest pain 03/31/2018  .  Vasovagal syncope 03/20/2018  . Lichen planus [id number]  . Malignant neoplasm of upper-inner quadrant of right breast in female, estrogen receptor positive ([place]) 07/28/2016  . Gastric ulcer requiring drug therapy, chronic 01/23/2016  . MI (mitral incompetence) 07/22/2015  . Combined fat and carbohydrate induced hyperlipemia 12/26/2014  . UTI (lower urinary tract infection) 08/18/2014  . Blurry vision 08/18/2014  . Allergic rhinitis 07/12/2014  . Absolute anemia 07/12/2014  . Baker's cyst of knee 07/12/2014  . Atherosclerosis of coronary artery 07/12/2014  . CAFL (chronic airflow limitation) ([place]) 07/12/2014  . Dizziness 07/12/2014  . Acid reflux 07/12/2014  . 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 History of colon polyps 07/12/2014  . Hypercholesteremia 07/12/2014  . Malaise and fatigue 07/12/2014  . Heart attack ([place]) 07/12/2014  . Muscle ache 07/12/2014  . Arthritis, degenerative 07/12/2014  . Bradycardia 08/23/2013  . CAD (coronary artery disease) 08/17/2013  . Diabetes ([place]) 08/17/2013  . Diabetes mellitus ([place]) 08/17/2013  . Peripheral vascular disease ([place]) 08/17/2013    Past Surgical History:  Procedure Laterality Date  . ABDOMINAL HYSTERECTOMY    . APPENDECTOMY    . BREAST BIOPSY Right 07/23/2016   INVASIVE MAMMARY CARCINOMA WITH AREAS OF EXTRACELLULAR MUCIN  . BREAST EXCISIONAL BIOPSY     INVASIVE MUCINOUS MAMMARY CARCINOMA.   Marland Kitchen BREAST LUMPECTOMY Right 08/20/2016  INVASIVE MUCINOUS MAMMARY CARCINOMA. /Grade 2   . COLONOSCOPY  2016  . COLONOSCOPY N/A 09/06/2018   Procedure: COLONOSCOPY;  Surgeon: Virgel Manifold, MD;  Location: [facility] ENDOSCOPY;  Service: Endoscopy;  Laterality: N/A;  . CORONARY ANGIOPLASTY WITH STENT PLACEMENT    . CYSTOSCOPY    . CYSTOSCOPY WITH URETHRAL DILATATION N/A 06/12/2019   Procedure: CYSTOSCOPY WITH URETHRAL DILATATION;  Surgeon: Abbie Sons, MD;  Location: [facility] ORS;  Service: Urology;  Laterality: N/A;  . ESOPHAGOGASTRODUODENOSCOPY  N/A 09/06/2018   Procedure: ESOPHAGOGASTRODUODENOSCOPY (EGD);  Surgeon: Virgel Manifold, MD;  Location: [facility] ENDOSCOPY;  Service: Endoscopy;  Laterality: N/A;  . OOPHORECTOMY    . PARTIAL MASTECTOMY WITH AXILLARY SENTINEL LYMPH NODE BIOPSY Right 08/09/2016   Procedure: PARTIAL MASTECTOMY WITH AXILLARY SENTINEL LYMPH NODE BIOPSY;  Surgeon: Robert Bellow, MD;  Location: [facility] ORS;  Service: General;  Laterality: Right;  . TONSILLECTOMY       OB History    Gravida  2   Para  2   Term      Preterm      AB      Living        SAB      TAB      Ectopic      Multiple      Live Births           Obstetric Comments  1st Menstrual Cycle:  10  1st Pregnancy: 36         Family History  Problem Relation Age of Onset  . Hyperlipidemia Mother   . Allergies Mother   . Cerebral aneurysm Mother        cause of death at age 80  . Heart disease Father        Fatal MI ag 74  . Cancer Brother        lung cancer  . Hyperlipidemia Brother   . Colonic polyp Brother   . Healthy Son   . Cancer - Lung Brother        colon  . Healthy Son   . Breast cancer Neg Hx     Social History   Tobacco Use  . Smoking status: Former Smoker    Packs/day: 0.25    Years: 10.00    Pack years: 2.50    Types: Cigarettes    Quit date: 03/06/1998    Years since quitting: 21.3  . Smokeless tobacco: Never Used  Substance Use Topics  . Alcohol use: No  . Drug use: No    Home Medications Prior to Admission medications   Medication Sig Start Date End Date Taking? Authorizing Provider  acetaminophen (TYLENOL) 650 MG CR tablet Take 1,300 mg by mouth every 8 (eight) hours as needed for pain.   Yes [provider]  Calcium Carb-Cholecalciferol (CALCIUM 500+D PO) Take by mouth every other day.   Yes [provider]  letrozole (FEMARA) 2.5 MG tablet Take 2.5 mg by mouth daily.   Yes [provider]  lisinopril (ZESTRIL) 20 MG tablet Take 20 mg by mouth daily.  05/29/19  Yes [provider]  omeprazole (PRILOSEC) 20 MG capsule TAKE 1 CAPSULE (20 MG TOTAL) BY MOUTH DAILY AS NEEDED (INDIGESTION). Patient taking differently: Take 20 mg by mouth daily as needed Jerrye Bushy).  06/12/19  Yes Jerrol Banana., MD  senna-docusate (SENOKOT-S) 8.6-50 MG tablet Take 2 tablets by mouth 2 (two) times daily as needed for mild constipation. 06/28/19  Yes Sharen Hones,  MD    Allergies    Celebrex  [celecoxib], Penicillins, and Prevacid [lansoprazole]  Review of Systems   Review of Systems  Neurological: Positive for syncope.  All other systems reviewed and are negative.   Physical Exam Updated Vital Signs BP (!) 111/59   Pulse 71   Temp 98.1 F (36.7 C) (Oral)   Resp 18   Ht 5\' 4"  (1.626 m)   Wt 61.2 kg   SpO2 100%   BMI 23.17 kg/m   Physical Exam Vitals and nursing note reviewed.  Constitutional:      Appearance: Normal appearance.  HENT:     Head: Normocephalic and atraumatic.     Right Ear: External ear normal.     Left Ear: External ear normal.     Nose: Nose normal.     Mouth/Throat:     Mouth: Mucous membranes are moist.     Pharynx: Oropharynx is clear.  Eyes:     Extraocular Movements: Extraocular movements intact.     Conjunctiva/sclera: Conjunctivae normal.     Pupils: Pupils are equal, round, and reactive to light.  Cardiovascular:     Rate and Rhythm: Normal rate and regular rhythm.     Pulses: Normal pulses.     Heart sounds: Normal heart sounds.  Pulmonary:     Effort: Pulmonary effort is normal.     Breath sounds: Normal breath sounds.  Abdominal:     General: Abdomen is flat. Bowel sounds are normal.     Palpations: Abdomen is soft.  Musculoskeletal:        General: Normal range of motion.     Cervical back: Normal range of motion and neck supple.  Skin:    General: Skin is warm.     Capillary Refill: Capillary refill takes less than 2 seconds.  Neurological:     General: No focal deficit present.      Mental Status: She is alert and oriented to person, place, and time.  Psychiatric:        Mood and Affect: Mood normal.        Behavior: Behavior normal.        Thought Content: Thought content normal.        Judgment: Judgment normal.     ED Results / Procedures / Treatments   Labs (all labs ordered are listed, but only abnormal results are displayed) Labs Reviewed  CBC WITH DIFFERENTIAL/PLATELET - Abnormal; Notable for the following components:      Result Value   Hemoglobin 10.4 (*)    HCT 34.6 (*)    RDW 20.0 (*)    All other components within normal limits  COMPREHENSIVE METABOLIC PANEL - Abnormal; Notable for the following components:   Glucose, Bld 142 (*)    Creatinine, Ser 1.45 (*)    Calcium 8.8 (*)    Total Protein 6.3 (*)    Albumin 2.8 (*)    GFR calc non Af Amer 34 (*)    GFR calc Af Amer 39 (*)    All other components within normal limits  URINALYSIS, ROUTINE W REFLEX MICROSCOPIC - Abnormal; Notable for the following components:   Color, Urine AMBER (*)    APPearance CLOUDY (*)    Hgb urine dipstick SMALL (*)    Protein, ur 100 (*)    Leukocytes,Ua LARGE (*)    RBC / HPF >50 (*)    WBC, UA >50 (*)    Bacteria, UA MANY (*)    Non Squamous  Epithelial 0-5 (*)    All other components within normal limits  LACTIC ACID, PLASMA - Abnormal; Notable for the following components:   Lactic Acid, Venous 2.2 (*)    All other components within normal limits  CBG MONITORING, ED - Abnormal; Notable for the following components:   Glucose-Capillary 122 (*)    All other components within normal limits  URINE CULTURE  CULTURE, BLOOD (ROUTINE X 2)  CULTURE, BLOOD (ROUTINE X 2)  SARS CORONAVIRUS 2 (TAT 6-24 HRS)  AMMONIA  PROTIME-INR    EKG EKG Interpretation  Date/Time:  Sunday July 01 2019 12:38:48 EDT Ventricular Rate:  76 PR Interval:    QRS Duration: 80 QT Interval:  407 QTC Calculation: 458 R Axis:   63 Text Interpretation: Sinus rhythm Atrial premature  complexes Short PR interval No significant change since last tracing Confirmed by Isla Pence [phone]) on 07/01/2019 12:51:01 PM   Radiology CT Head Wo Contrast  Result Date: 07/01/2019 CLINICAL DATA:  Altered mental status. EXAM: CT HEAD WITHOUT CONTRAST TECHNIQUE: Contiguous axial images were obtained from the base of the skull through the vertex without intravenous contrast. COMPARISON:  Head CT 08/18/2014 FINDINGS: Brain: Stable age related cerebral atrophy, ventriculomegaly and periventricular white matter disease. No extra-axial fluid collections are identified. No CT findings for acute hemispheric infarction or intracranial hemorrhage. No mass lesions. The brainstem and cerebellum are normal. Vascular: Vascular calcifications but no hyperdense vessels or obvious aneurysm. Skull: No skull fracture or bone lesions. Sinuses/Orbits: The paranasal sinuses and mastoid air cells are clear. The globes are intact. Other: No scalp lesions or hematoma. IMPRESSION: 1. Stable age related cerebral atrophy, ventriculomegaly and periventricular white matter disease. 2. No acute intracranial findings or mass lesions. Electronically Signed   By: Marijo Sanes M.D.   On: 07/01/2019 13:43   DG Chest Portable 1 View  Result Date: 07/01/2019 CLINICAL DATA:  Altered mental status EXAM: PORTABLE CHEST 1 VIEW COMPARISON:  06/30/2019 FINDINGS: The heart size and mediastinal contours are within normal limits. Hiatal hernia. Both lungs are clear. The visualized skeletal structures are unremarkable. IMPRESSION: 1.  No acute abnormality of the lungs. 2.  Hiatal hernia. Electronically Signed   By: Eddie Candle M.D.   On: 07/01/2019 13:19   DG Chest Port 1 View  Result Date: 06/30/2019 CLINICAL DATA:  Patient not feeling well. EXAM: PORTABLE CHEST 1 VIEW COMPARISON:  June 11, 2019 FINDINGS: Moderate to large left hiatal hernia. The heart, hila, mediastinum are normal. No pneumothorax. No pulmonary nodules or masses. No focal  infiltrates. IMPRESSION: Moderate to large hiatal hernia.  No other abnormalities. Electronically Signed   By: Dorise Bullion III M.D   On: 06/30/2019 12:15    Procedures Procedures (including critical care time)  Medications Ordered in ED Medications  lactated ringers bolus 1,000 mL (has no administration in time range)  cefTRIAXone (ROCEPHIN) 1 g in sodium chloride 0.9 % 100 mL IVPB (has no administration in time range)  sodium chloride 0.9 % bolus 1,000 mL (1,000 mLs Intravenous New Bag/Given 07/01/19 1252)    ED Course  I have reviewed the triage vital signs and the nursing notes.  Pertinent labs & imaging results that were available during my care of the patient were reviewed by me and considered in my medical decision making (see chart for details).    MDM Rules/Calculators/A&P                      Pt has had both  doses of the Moderna Covid vaccine.  Covid swab neg on 4/21.  Pt's urine culture from yesterday has grown out e.coli.  Due to pt's hypotension, E. Coli infection and elevated lactic acid a code sepsis was called.  Pt given 1L NS (weight based fluid) and bp still drops with sitting up.  An additional 1L LR given.  After a discussion with pharmacy, pt given rocephin as he thinks that will cover the e.coli.  No pseudomonas seen on cx.  Pt d/w Dr. Roosevelt Locks (triad) for admission.  CRITICAL CARE Performed by: Isla Pence   Total critical care time: 30 minutes  Critical care time was exclusive of separately billable procedures and treating other patients.  Critical care was necessary to treat or prevent imminent or life-threatening deterioration.  Critical care was time spent personally by me on the following activities: development of treatment plan with patient and/or surrogate as well as nursing, discussions with consultants, evaluation of patient's response to treatment, examination of patient, obtaining history from patient or surrogate, ordering and performing  treatments and interventions, ordering and review of laboratory studies, ordering and review of radiographic studies, pulse oximetry and re-evaluation of patient's condition.   Final Clinical Impression(s) / ED Diagnoses Final diagnoses:  Sepsis without acute organ dysfunction, due to unspecified organism [facility])  Urinary tract infection associated with indwelling urethral catheter, initial encounter ([place])  Syncope, unspecified syncope type    Rx / DC Orders ED Discharge Orders    None       Isla Pence, MD 07/01/19 1450

## 2019-07-01 NOTE — H&P (Signed)
History and Physical    Sharnise Port Pizzuto [id number] DOB: 02-Sep-1940 DOA: 07/01/2019  PCP: Jerrol Banana., MD   Patient coming from: Home  I have personally briefly reviewed patient's old medical records in [place]  Chief Complaint: Syncope  HPI: [name] [name] is a 79 y.o. female with medical history significant of breast cancer, coronary artery disease, essential hypertension, who presents to the hospital after recurrent syncope episode.  This morning, patient was sitting on her bed, husband brought her breakfast, and noticed patient was unresponsive, husband denied patient had pale looking or cold or clammy, no eye rolling or tongue bite, and she recovered in about 2-3 min, with confusion and headache. No loss control BM (She is on indwelling Foley).  Patient went to [facility] ER yesterday for feeling weakness and lightheaded, and feeling like she might pass out. Also 4 days ago, patient went to [facility] days ago, for another similar episode of syncope, when patient passed out in a car, and the patient did not remember any prodrome such as lightheaded or palpitations were nauseous, and she improved  in short time.  She did not have any symptoms afterwards. She did have some arrhythmia with 8 beats of SVT in [facility] ED which resolved and no more syncope or pre-syncope. Earlier this month, patient was in the hospital  with acute renal failure secondary to urethral obstruction.  She was seen by urology, her renal function had recovered at time of discharge.  She was able to urinate on her own at that time.  She went to the urologist office yesterday for checkup, was found to have a significant urinary retention.  A Foley catheter was anchored since.  ED Course: BP low and pt was given IV boluses, UA showed UTI, antibiotic started.  Review of Systems: As per HPI otherwise 10 point review of systems negative.   Past Medical History:  Diagnosis Date  . Breast cancer  ([place]) 2018   Right breast  . 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 Mucin noted on biopsy, not on wide excision.  . Coronary artery disease   . GERD (gastroesophageal reflux disease)   . Hyperlipidemia   . Hypertension   . 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 Dr. Kellie Moor DX by punch bioppsy  . Lichen planus    right breast   . Lichen planus   . MI (myocardial infarction) ([place])    2015  . Personal history of radiation therapy 09/2016   RIGHT lumpectomy w/ radiation    Past Surgical History:  Procedure Laterality Date  . ABDOMINAL HYSTERECTOMY    . APPENDECTOMY    . BREAST BIOPSY Right 07/23/2016   INVASIVE MAMMARY CARCINOMA WITH AREAS OF EXTRACELLULAR MUCIN  . BREAST EXCISIONAL BIOPSY     INVASIVE MUCINOUS MAMMARY CARCINOMA.   Marland Kitchen BREAST LUMPECTOMY Right 08/20/2016   INVASIVE MUCINOUS MAMMARY CARCINOMA. /Grade 2   . COLONOSCOPY  2016  . COLONOSCOPY N/A 09/06/2018   Procedure: COLONOSCOPY;  Surgeon: Virgel Manifold, MD;  Location: [facility] ENDOSCOPY;  Service: Endoscopy;  Laterality: N/A;  . CORONARY ANGIOPLASTY WITH STENT PLACEMENT    . CYSTOSCOPY    . CYSTOSCOPY WITH URETHRAL DILATATION N/A 06/12/2019   Procedure: CYSTOSCOPY WITH URETHRAL DILATATION;  Surgeon: Abbie Sons, MD;  Location: [facility] ORS;  Service: Urology;  Laterality: N/A;  . ESOPHAGOGASTRODUODENOSCOPY N/A 09/06/2018   Procedure: ESOPHAGOGASTRODUODENOSCOPY (EGD);  Surgeon: Virgel Manifold, MD;  Location[facility] ENDOSCOPY;  Service: Endoscopy;  Laterality: N/A;  . OOPHORECTOMY    . PARTIAL MASTECTOMY WITH AXILLARY SENTINEL LYMPH NODE BIOPSY Right 08/09/2016   Procedure: PARTIAL MASTECTOMY WITH AXILLARY SENTINEL LYMPH NODE BIOPSY;  Surgeon: Robert Bellow, MD;  Location: [facility] ORS;  Service: General;  Laterality: Right;  . TONSILLECTOMY       reports that she quit smoking about 21 years ago. Her smoking use included  cigarettes. She has a 2.50 pack-year smoking history. She has never used smokeless tobacco. She reports that she does not drink alcohol or use drugs.  Allergies  Allergen Reactions  . Celebrex  [Celecoxib]     Dark stools.  . Penicillins Other (See Comments)    Childhood reaction Has patient had a PCN reaction causing immediate rash, facial/tongue/throat swelling, SOB or lightheadedness with hypotension: Unknown Has patient had a PCN reaction causing severe rash involving mucus membranes or skin necrosis: Unknown Has patient had a PCN reaction that required hospitalization: Unknown Has patient had a PCN reaction occurring within the last 10 years: Unknown If all of the above answers are "NO", then may proceed with Cephalosporin use.   Marland Kitchen Prevacid [Lansoprazole] Diarrhea    Family History  Problem Relation Age of Onset  . Hyperlipidemia Mother   . Allergies Mother   . Cerebral aneurysm Mother        cause of death at age 26  . Heart disease Father        Fatal MI ag 36  . Cancer Brother        lung cancer  . Hyperlipidemia Brother   . Colonic polyp Brother   . Healthy Son   . Cancer - Lung Brother        colon  . Healthy Son   . Breast cancer Neg Hx      Prior to Admission medications   Medication Sig Start Date End Date Taking? Authorizing Provider  acetaminophen (TYLENOL) 650 MG CR tablet Take 1,300 mg by mouth every 8 (eight) hours as needed for pain.   Yes [provider]  Calcium Carb-Cholecalciferol (CALCIUM 500+D PO) Take by mouth every other day.   Yes [provider]  letrozole (FEMARA) 2.5 MG tablet Take 2.5 mg by mouth daily.   Yes [provider]  lisinopril (ZESTRIL) 20 MG tablet Take 20 mg by mouth daily. 05/29/19  Yes [provider]  omeprazole (PRILOSEC) 20 MG capsule TAKE 1 CAPSULE (20 MG TOTAL) BY MOUTH DAILY AS NEEDED (INDIGESTION). Patient taking differently: Take 20 mg by mouth daily as needed Jerrye Bushy).  06/12/19  Yes  Jerrol Banana., MD  senna-docusate (SENOKOT-S) 8.6-50 MG tablet Take 2 tablets by mouth 2 (two) times daily as needed for mild constipation. 06/28/19  Yes Sharen Hones, MD    Physical Exam: Vitals:   07/01/19 1500 07/01/19 1515 07/01/19 1545 07/01/19 1600  BP: (!) 141/73 (!) 143/71 131/70 108/61  Pulse: 64 69 64 63  Resp: 13 19 17  (!) 21  Temp:      TempSrc:      SpO2: 100% 100% 99% 100%  Weight:      Height:        Constitutional: NAD, calm, comfortable Vitals:   07/01/19 1500 07/01/19 1515 07/01/19 1545 07/01/19 1600  BP: (!) 141/73 (!) 143/71 131/70 108/61  Pulse: 64 69 64 63  Resp: 13 19 17  (!) 21  Temp:      TempSrc:  SpO2: 100% 100% 99% 100%  Weight:      Height:       Eyes: PERRL, lids and conjunctivae normal ENMT: Mucous membranes are moist. Posterior pharynx clear of any exudate or lesions.Normal dentition.  Neck: normal, supple, no masses, no thyromegaly Respiratory: clear to auscultation bilaterally, no wheezing, no crackles. Normal respiratory effort. No accessory muscle use.  Cardiovascular: Regular rate and rhythm, no murmurs / rubs / gallops. No extremity edema. 2+ pedal pulses. No carotid bruits.  Abdomen: no tenderness, no masses palpated. No hepatosplenomegaly. Bowel sounds positive.  Musculoskeletal: no clubbing / cyanosis. No joint deformity upper and lower extremities. Good ROM, no contractures. Normal muscle tone.  Skin: no rashes, lesions, ulcers. No induration Neurologic: CN 2-12 grossly intact. Sensation intact, DTR normal. Strength 5/5 in all 4.  Psychiatric: Normal judgment and insight. Alert and oriented x 3. Normal mood.    Labs on Admission: I have personally reviewed following labs and imaging studies  CBC: Recent Labs  Lab 06/27/19 1241 06/30/19 1110 07/01/19 1254  WBC 9.1 9.6 9.2  NEUTROABS 6.9 7.6 7.4  HGB 10.0* 10.1* 10.4*  HCT 31.8* 32.2* 34.6*  MCV 86.2 87.0 88.9  PLT 235 211 [id number]   Basic Metabolic Panel: Recent  Labs  Lab 06/27/19 1241 06/27/19 2040 06/28/19 0450 06/30/19 1110 07/01/19 1254  NA 139  --  142 137 137  K 4.4  --  4.1 4.1 3.7  CL 108  --  109 103 102  CO2 23  --  25 25 24   GLUCOSE 104*  --  80 115* 142*  BUN 28*  --  27* 18 16  CREATININE 1.44*  --  1.08* 1.16* 1.45*  CALCIUM 9.0  --  8.9 8.6* 8.8*  MG  --  2.2  --   --   --    GFR: Estimated Creatinine Clearance: 26.7 mL/min (A) (by C-G formula based on SCr of 1.45 mg/dL (H)). Liver Function Tests: Recent Labs  Lab 06/27/19 1241 06/30/19 1110 07/01/19 1254  AST 16 16 36  ALT 13 10 15   ALKPHOS 55 48 50  BILITOT 0.9 1.3* 1.1  PROT 6.4* 6.7 6.3*  ALBUMIN 3.3* 3.3* 2.8*   No results for input(s): LIPASE, AMYLASE in the last 168 hours. Recent Labs  Lab 07/01/19 1254  AMMONIA 11   Coagulation Profile: Recent Labs  Lab 07/01/19 1254  INR 1.1   Cardiac Enzymes: No results for input(s): CKTOTAL, CKMB, CKMBINDEX, TROPONINI in the last 168 hours. BNP (last 3 results) No results for input(s): PROBNP in the last 8760 hours. HbA1C: No results for input(s): HGBA1C in the last 72 hours. CBG: Recent Labs  Lab 07/01/19 1255  GLUCAP 122*   Lipid Profile: No results for input(s): CHOL, HDL, LDLCALC, TRIG, CHOLHDL, LDLDIRECT in the last 72 hours. Thyroid Function Tests: No results for input(s): TSH, T4TOTAL, FREET4, T3FREE, THYROIDAB in the last 72 hours. Anemia Panel: No results for input(s): VITAMINB12, FOLATE, FERRITIN, TIBC, IRON, RETICCTPCT in the last 72 hours. Urine analysis:    Component Value Date/Time   COLORURINE AMBER (A) 07/01/2019 1254   APPEARANCEUR CLOUDY (A) 07/01/2019 1254   APPEARANCEUR Cloudy (A) 04/25/2015 1449   LABSPEC 1.011 07/01/2019 1254   LABSPEC 1.021 01/17/2014 1524   PHURINE 6.0 07/01/2019 1254   GLUCOSEU NEGATIVE 07/01/2019 1254   GLUCOSEU Negative 01/17/2014 1524   HGBUR SMALL (A) 07/01/2019 1254   BILIRUBINUR NEGATIVE 07/01/2019 1254   BILIRUBINUR neg 06/30/2016 1619    BILIRUBINUR Negative  04/25/2015 1449   BILIRUBINUR Negative 01/17/2014 [place] 07/01/2019 1254   PROTEINUR 100 (A) 07/01/2019 1254   UROBILINOGEN 0.2 06/30/2016 1619   NITRITE NEGATIVE 07/01/2019 1254   LEUKOCYTESUR LARGE (A) 07/01/2019 1254   LEUKOCYTESUR 2+ 01/17/2014 1524    Radiological Exams on Admission: CT Head Wo Contrast  Result Date: 07/01/2019 CLINICAL DATA:  Altered mental status. EXAM: CT HEAD WITHOUT CONTRAST TECHNIQUE: Contiguous axial images were obtained from the base of the skull through the vertex without intravenous contrast. COMPARISON:  Head CT 08/18/2014 FINDINGS: Brain: Stable age related cerebral atrophy, ventriculomegaly and periventricular white matter disease. No extra-axial fluid collections are identified. No CT findings for acute hemispheric infarction or intracranial hemorrhage. No mass lesions. The brainstem and cerebellum are normal. Vascular: Vascular calcifications but no hyperdense vessels or obvious aneurysm. Skull: No skull fracture or bone lesions. Sinuses/Orbits: The paranasal sinuses and mastoid air cells are clear. The globes are intact. Other: No scalp lesions or hematoma. IMPRESSION: 1. Stable age related cerebral atrophy, ventriculomegaly and periventricular white matter disease. 2. No acute intracranial findings or mass lesions. Electronically Signed   By: Marijo Sanes M.D.   On: 07/01/2019 13:43   DG Chest Portable 1 View  Result Date: 07/01/2019 CLINICAL DATA:  Altered mental status EXAM: PORTABLE CHEST 1 VIEW COMPARISON:  06/30/2019 FINDINGS: The heart size and mediastinal contours are within normal limits. Hiatal hernia. Both lungs are clear. The visualized skeletal structures are unremarkable. IMPRESSION: 1.  No acute abnormality of the lungs. 2.  Hiatal hernia. Electronically Signed   By: Eddie Candle M.D.   On: 07/01/2019 13:19   DG Chest Port 1 View  Result Date: 06/30/2019 CLINICAL DATA:  Patient not feeling well. EXAM:  PORTABLE CHEST 1 VIEW COMPARISON:  June 11, 2019 FINDINGS: Moderate to large left hiatal hernia. The heart, hila, mediastinum are normal. No pneumothorax. No pulmonary nodules or masses. No focal infiltrates. IMPRESSION: Moderate to large hiatal hernia.  No other abnormalities. Electronically Signed   By: Dorise Bullion III M.D   On: 06/30/2019 12:15    EKG: Independently reviewed.  Shortened PR interval  Assessment/Plan Active Problems:   Sepsis ([place])  Sepsis, evidenced by endorgan impairment of confusion and syncope, low blood pressure and elevated lactic acid, source considered to be UTI UA from yesterday showed E. coli, sensitivity pending, will cover with ceftriaxone for now Hold BP meds  Syncope Probably multifactorial from hypovolemia from sepsis plus minus arrhythmia Sepsis first and will evaluate potential arrhythmia with underlying shortened PR interval Discussed with on-call cardiologist, who will evaluate patient tomorrow  Hypertension Above  Complicated UTI with indwelling Foley catheter Likely will need at least 7 to 10 days antibiotics  Urethra obstruction Likely will go home with Foley in place  CKD stage II Stable, correct hypokalemia  Shortened PR-Interval Cardio will follow  Breast CA On hormone modification   DVT prophylaxis: Heparin subcu Code Status: DNR Family Communication: Husband at bedside Disposition Plan: Elevation, likely can be discharged in 1 to 2 days Consults called: Cardiology Dr. Lovena Le Admission status: Telemetry admission   Lequita Halt MD [facility] Pager [phone]   07/01/2019, 4:27 PM

## 2019-07-01 NOTE — Progress Notes (Signed)
Notified provider of need to order repeat lactic acid. ° °

## 2019-07-01 NOTE — ED Triage Notes (Signed)
Pt here POV with husband who witnessed a two minute syncopal episode this morning prior to her eating breakfast. No fall or injury. Pt sts she has passed out an additional two times since then, once in the ED lobby. Seen for same at [facility] yesterday. Pt sts she is "hurting so bad" but is unable to describe where. When asked says "they don't know where I'm hurting."

## 2019-07-01 NOTE — ED Notes (Signed)
Report called to kristin rn on 2w

## 2019-07-02 DIAGNOSIS — G9341 Metabolic encephalopathy: Secondary | ICD-10-CM | POA: Diagnosis not present

## 2019-07-02 DIAGNOSIS — R55 Syncope and collapse: Secondary | ICD-10-CM

## 2019-07-02 DIAGNOSIS — A4151 Sepsis due to Escherichia coli [E. coli]: Secondary | ICD-10-CM

## 2019-07-02 DIAGNOSIS — R6520 Severe sepsis without septic shock: Secondary | ICD-10-CM

## 2019-07-02 DIAGNOSIS — T83511A Infection and inflammatory reaction due to indwelling urethral catheter, initial encounter: Principal | ICD-10-CM

## 2019-07-02 DIAGNOSIS — N390 Urinary tract infection, site not specified: Secondary | ICD-10-CM

## 2019-07-02 LAB — BLOOD CULTURE ID PANEL (REFLEXED)

## 2019-07-02 LAB — URINE CULTURE: Culture: >=100000 — AB

## 2019-07-02 LAB — CBC
HCT: 30.8 % — ABNORMAL LOW (ref 36.0–46.0)
Hemoglobin: 9.5 g/dL — ABNORMAL LOW (ref 12.0–15.0)
MCH: 26.7 pg (ref 26.0–34.0)
MCHC: 30.8 g/dL (ref 30.0–36.0)
MCV: 86.5 fL (ref 80.0–100.0)
Platelets: 183 10*3/uL (ref 150–400)
RBC: 3.56 MIL/uL — ABNORMAL LOW (ref 3.87–5.11)
RDW: 19.5 % — ABNORMAL HIGH (ref 11.5–15.5)
WBC: 7.9 10*3/uL (ref 4.0–10.5)
nRBC: 0 % (ref 0.0–0.2)

## 2019-07-02 LAB — BASIC METABOLIC PANEL
Anion gap: 9 (ref 5–15)
BUN: 15 mg/dL (ref 8–23)
CO2: 23 mmol/L (ref 22–32)
Calcium: 8.4 mg/dL — ABNORMAL LOW (ref 8.9–10.3)
Chloride: 105 mmol/L (ref 98–111)
Creatinine, Ser: 1.05 mg/dL — ABNORMAL HIGH (ref 0.44–1.00)
GFR calc Af Amer: 58 mL/min — ABNORMAL LOW (ref >60)
GFR calc non Af Amer: 50 mL/min — ABNORMAL LOW (ref >60)
Glucose, Bld: 106 mg/dL — ABNORMAL HIGH (ref 70–99)
Potassium: 3.6 mmol/L (ref 3.5–5.1)
Sodium: 137 mmol/L (ref 135–145)

## 2019-07-02 MED ORDER — HEPARIN SODIUM (PORCINE) 5000 UNIT/ML IJ SOLN: 5000.0000 [IU] | Freq: Three times a day (TID) | INTRAMUSCULAR | Status: DC

## 2019-07-02 MED ORDER — SODIUM CHLORIDE 0.9 % IV SOLN
2.0000 g | INTRAVENOUS | Status: DC
  Administered 2019-07-02 – 2019-07-03 (×2): 2 g via INTRAVENOUS
  Filled 2019-07-02 (×2): qty 2
  Filled 2019-07-02: qty 20

## 2019-07-02 MED ORDER — HEPARIN SODIUM (PORCINE) 5000 UNIT/ML IJ SOLN
5000.0000 [IU] | Freq: Three times a day (TID) | INTRAMUSCULAR | Status: DC
  Administered 2019-07-02 – 2019-07-12 (×27): 5000 [IU] via SUBCUTANEOUS
  Filled 2019-07-02 (×28): qty 1

## 2019-07-02 NOTE — Progress Notes (Signed)
PROGRESS NOTE    [name] [name]  [id number] DOB: 1940/12/14 DOA: 07/01/2019 PCP: Jerrol Banana., MD    Brief Narrative:  79 y.o. female with medical history significant of breast cancer, coronary artery disease, essential hypertension, who presents to the hospital after recurrent syncope episode.  This morning, patient was sitting on her bed, husband brought her breakfast, and noticed patient was unresponsive, husband denied patient had pale looking or cold or clammy, no eye rolling or tongue bite, and she recovered in about 2-3 min, with confusion and headache. No loss control BM (She is on indwelling Foley).  Patient went to [facility] ER yesterday for feeling weakness and lightheaded, and feeling like she might pass out. Also 4 days ago, patient went to [facility] days ago, for another similar episode of syncope, when patient passed out in a car, and the patient did not remember any prodrome such as lightheaded or palpitations were nauseous, and she improved in short time. She did not have any symptoms afterwards. She did have some arrhythmia with 8 beats of SVT in [facility] ED which resolved and no more syncope or pre-syncope. Earlier this month, patient was in the hospital  with acute renal failure secondary to urethral obstruction. She was seen by urology, her renal function had recovered at time of discharge. She was able to urinate on her own at that time. She went to the urologist office yesterday for checkup, was found to have a significant urinary retention. A Foley catheter was anchored since.  Assessment & Plan:   Active Problems:   Sepsis ([place])   Sepsis with ecoli UTI and bacteremia present on admit, evidenced by endorgan impairment of confusion and syncope, low blood pressure and elevated lactic acid Recent urine culture growing E. Coli, resistant to cipro, ampicillin, bactrim Culture also growing klebsiella, resistant to ampicillin Blood cx pos for ecoli.  Cont with ceftriaxone for now Repeat CBC in AM  Syncope Probably multifactorial from hypovolemia from sepsis plus minus arrhythmia Cardiology consulted  Hypertension Noted to be labile this AM BP currently stable, cont to monitor  Complicated ecoli and kelbsiella UTI with ecoli bacteremia with indwelling Foley catheter Likely will need at least 7 to 10 days antibiotics  Urethra obstruction Likely will go home with Foley in place Stable at present  Acute on CKD stage II Cr improved from 1.45 to 1.05 Repeat bmet in AM  Shortened PR-Interval Cardiology was consulted  Breast CA On hormone modification prior to admit  DVT prophylaxis: Heparin subq Code Status: DNR Family Communication: Pt in room, family not at bedside  Status is: Inpatient  Remains inpatient appropriate because:IV treatments appropriate due to intensity of illness or inability to take PO   Dispo: The patient is from: Home              Anticipated d/c is to: Home              Anticipated d/c date is: 3 days              Patient currently is not medically stable to d/c.  Consultants:   Cardiology  Procedures:     Antimicrobials: Anti-infectives (From admission, onward)   Start     Dose/Rate Route Frequency Ordered Stop   07/02/19 1000  cefTRIAXone (ROCEPHIN) 1 g in sodium chloride 0.9 % 100 mL IVPB  Status:  Discontinued     1 g 200 mL/hr over 30 Minutes Intravenous Every 24 hours 07/01/19 1538 07/01/19 1541  07/02/19 0800  cefTRIAXone (ROCEPHIN) 2 g in sodium chloride 0.9 % 100 mL IVPB     2 g 200 mL/hr over 30 Minutes Intravenous Every 24 hours 07/02/19 0643     07/01/19 1430  aztreonam (AZACTAM) 1 g in sodium chloride 0.9 % 100 mL IVPB  Status:  Discontinued     1 g 200 mL/hr over 30 Minutes Intravenous  Once 07/01/19 1417 07/01/19 1419   07/01/19 1430  cefTRIAXone (ROCEPHIN) 1 g in sodium chloride 0.9 % 100 mL IVPB  Status:  Discontinued     1 g 200 mL/hr over 30 Minutes  Intravenous Every 24 hours 07/01/19 1425 07/02/19 0643       Subjective: Asking about going home  Objective: Vitals:   07/01/19 1732 07/01/19 2302 07/02/19 0435 07/02/19 0739  BP: 137/77 (!) 185/83 (!) 173/82 (!) 109/57  Pulse: 67 85 87 68  Resp: 18 20    Temp:  98.2 F (36.8 C) 99.6 F (37.6 C) 98.2 F (36.8 C)  TempSrc:   Oral   SpO2:  100% 100% 100%  Weight:      Height:        Intake/Output Summary (Last 24 hours) at 07/02/2019 1459 Last data filed at 07/02/2019 0500 Gross per 24 hour  Intake 1100 ml  Output 900 ml  Net 200 ml   Filed Weights   07/01/19 1421  Weight: 61.2 kg    Examination:  General exam: Appears calm and comfortable  Respiratory system: Clear to auscultation. Respiratory effort normal. Cardiovascular system: S1 & S2 heard, Regular Gastrointestinal system: Abdomen is nondistended, soft and nontender. No organomegaly or masses felt. Normal bowel sounds heard. Central nervous system: Alert and oriented. No focal neurological deficits. Extremities: Symmetric 5 x 5 power. Skin: No rashes, lesions  Psychiatry: Judgement and insight appear normal. Mood & affect appropriate.   Data Reviewed: I have personally reviewed following labs and imaging studies  CBC: Recent Labs  Lab 06/27/19 1241 06/30/19 1110 07/01/19 1254 07/02/19 0538  WBC 9.1 9.6 9.2 7.9  NEUTROABS 6.9 7.6 7.4  --   HGB 10.0* 10.1* 10.4* 9.5*  HCT 31.8* 32.2* 34.6* 30.8*  MCV 86.2 87.0 88.9 86.5  PLT 235 211 180 [id number]   Basic Metabolic Panel: Recent Labs  Lab 06/27/19 1241 06/27/19 2040 06/28/19 0450 06/30/19 1110 07/01/19 1254 07/02/19 0538  NA 139  --  142 137 137 137  K 4.4  --  4.1 4.1 3.7 3.6  CL 108  --  109 103 102 105  CO2 23  --  25 25 24 23   GLUCOSE 104*  --  80 115* 142* 106*  BUN 28*  --  27* 18 16 15   CREATININE 1.44*  --  1.08* 1.16* 1.45* 1.05*  CALCIUM 9.0  --  8.9 8.6* 8.8* 8.4*  MG  --  2.2  --   --   --   --    GFR: Estimated Creatinine  Clearance: 36.9 mL/min (A) (by C-G formula based on SCr of 1.05 mg/dL (H)). Liver Function Tests: Recent Labs  Lab 06/27/19 1241 06/30/19 1110 07/01/19 1254  AST 16 16 36  ALT 13 10 15   ALKPHOS 55 48 50  BILITOT 0.9 1.3* 1.1  PROT 6.4* 6.7 6.3*  ALBUMIN 3.3* 3.3* 2.8*   No results for input(s): LIPASE, AMYLASE in the last 168 hours. Recent Labs  Lab 07/01/19 1254  AMMONIA 11   Coagulation Profile: Recent Labs  Lab 07/01/19 1254  INR 1.1  Cardiac Enzymes: No results for input(s): CKTOTAL, CKMB, CKMBINDEX, TROPONINI in the last 168 hours. BNP (last 3 results) No results for input(s): PROBNP in the last 8760 hours. HbA1C: No results for input(s): HGBA1C in the last 72 hours. CBG: Recent Labs  Lab 07/01/19 1255  GLUCAP 122*   Lipid Profile: No results for input(s): CHOL, HDL, LDLCALC, TRIG, CHOLHDL, LDLDIRECT in the last 72 hours. Thyroid Function Tests: No results for input(s): TSH, T4TOTAL, FREET4, T3FREE, THYROIDAB in the last 72 hours. Anemia Panel: No results for input(s): VITAMINB12, FOLATE, FERRITIN, TIBC, IRON, RETICCTPCT in the last 72 hours. Sepsis Labs: Recent Labs  Lab 07/01/19 1254 07/01/19 1815 07/01/19 2138  LATICACIDVEN 2.2* 1.7 1.4    Recent Results (from the past 240 hour(s))  Respiratory Panel by RT PCR (Flu A&B, Covid) - Nasopharyngeal Swab     Status: None   Collection Time: 06/27/19  8:06 PM   Specimen: Nasopharyngeal Swab  Result Value Ref Range Status   SARS Coronavirus 2 by RT PCR NEGATIVE NEGATIVE Final    Comment: (NOTE) SARS-CoV-2 target nucleic acids are NOT DETECTED. The SARS-CoV-2 RNA is generally detectable in upper respiratoy specimens during the acute phase of infection. The lowest concentration of SARS-CoV-2 viral copies this assay can detect is 131 copies/mL. A negative result does not preclude SARS-Cov-2 infection and should not be used as the sole basis for treatment or other patient management decisions. A negative  result may occur with  improper specimen collection/handling, submission of specimen other than nasopharyngeal swab, presence of viral mutation(s) within the areas targeted by this assay, and inadequate number of viral copies (<131 copies/mL). A negative result must be combined with clinical observations, patient history, and epidemiological information. The expected result is Negative. Fact Sheet for Patients:  PinkCheek.be Fact Sheet for Healthcare Providers:  GravelBags.it This test is not yet ap proved or cleared by the [place] FDA and  has been authorized for detection and/or diagnosis of SARS-CoV-2 by FDA under an Emergency Use Authorization (EUA). This EUA will remain  in effect (meaning this test can be used) for the duration of the COVID-19 declaration under Section 564(b)(1) of the Act, 21 U.S.C. section 360bbb-3(b)(1), unless the authorization is terminated or revoked sooner.    Influenza A by PCR NEGATIVE NEGATIVE Final   Influenza B by PCR NEGATIVE NEGATIVE Final    Comment: (NOTE) The Xpert Xpress SARS-CoV-2/FLU/RSV assay is intended as an aid in  the diagnosis of influenza from Nasopharyngeal swab specimens and  should not be used as a sole basis for treatment. Nasal washings and  aspirates are unacceptable for Xpert Xpress SARS-CoV-2/FLU/RSV  testing. Fact Sheet for Patients: PinkCheek.be Fact Sheet for Healthcare Providers: GravelBags.it This test is not yet approved or cleared by the [place] FDA and  has been authorized for detection and/or diagnosis of SARS-CoV-2 by  FDA under an Emergency Use Authorization (EUA). This EUA will remain  in effect (meaning this test can be used) for the duration of the  Covid-19 declaration under Section 564(b)(1) of the Act, 21  U.S.C. section 360bbb-3(b)(1), unless the authorization is  terminated or  revoked. Performed at [facility], [address] [place], [place] [address]   Urine culture     Status: Abnormal   Collection Time: 06/30/19  1:45 PM   Specimen: Urine, Random  Result Value Ref Range Status   Specimen Description   Final    URINE, RANDOM Performed at [facility], [place],  [place], [place] [address]    Special Requests   Final    NONE Performed at [facility], [address] [place], [place] [address]    Culture (A)  Final    >=100,000 COLONIES/mL ESCHERICHIA COLI 80,000 COLONIES/mL KLEBSIELLA PNEUMONIAE    Report Status 07/02/2019 FINAL  Final   Organism ID, Bacteria ESCHERICHIA COLI (A)  Final   Organism ID, Bacteria KLEBSIELLA PNEUMONIAE (A)  Final      Susceptibility   Escherichia coli - MIC*    AMPICILLIN >=32 RESISTANT Resistant     CEFAZOLIN <=4 SENSITIVE Sensitive     CEFTRIAXONE <=1 SENSITIVE Sensitive     CIPROFLOXACIN >=4 RESISTANT Resistant     GENTAMICIN <=1 SENSITIVE Sensitive     IMIPENEM <=0.25 SENSITIVE Sensitive     NITROFURANTOIN <=16 SENSITIVE Sensitive     TRIMETH/SULFA >=320 RESISTANT Resistant     AMPICILLIN/SULBACTAM 8 SENSITIVE Sensitive     PIP/TAZO <=4 SENSITIVE Sensitive     * >=100,000 COLONIES/mL ESCHERICHIA COLI   Klebsiella pneumoniae - MIC*    AMPICILLIN RESISTANT Resistant     CEFAZOLIN <=4 SENSITIVE Sensitive     CEFTRIAXONE <=1 SENSITIVE Sensitive     CIPROFLOXACIN <=0.25 SENSITIVE Sensitive     GENTAMICIN <=1 SENSITIVE Sensitive     IMIPENEM <=0.25 SENSITIVE Sensitive     NITROFURANTOIN 32 SENSITIVE Sensitive     TRIMETH/SULFA <=20 SENSITIVE Sensitive     AMPICILLIN/SULBACTAM 4 SENSITIVE Sensitive     PIP/TAZO <=4 SENSITIVE Sensitive     * 80,000 COLONIES/mL KLEBSIELLA PNEUMONIAE  Urine culture     Status: Abnormal (Preliminary result)   Collection Time: 07/01/19 12:54 PM   Specimen: Urine, Random  Result Value Ref Range Status   Specimen Description URINE, RANDOM  Final    Special Requests SITE NOT SPECIFIED  Final   Culture (A)  Final    >=100,000 COLONIES/mL GRAM NEGATIVE RODS IDENTIFICATION AND SUSCEPTIBILITIES TO FOLLOW CULTURE REINCUBATED FOR BETTER GROWTH Performed at [facility], [address], [place], [place] [address]    Report Status PENDING  Incomplete  Culture, blood (routine x 2)     Status: None (Preliminary result)   Collection Time: 07/01/19  2:31 PM   Specimen: BLOOD  Result Value Ref Range Status   Specimen Description BLOOD LEFT ANTECUBITAL  Final   Special Requests   Final    BOTTLES DRAWN AEROBIC AND ANAEROBIC Blood Culture results may not be optimal due to an inadequate volume of blood received in culture bottles   Culture  Setup Time   Final    GRAM NEGATIVE RODS ANAEROBIC BOTTLE ONLY CRITICAL VALUE NOTED.  VALUE IS CONSISTENT WITH PREVIOUSLY REPORTED AND CALLED VALUE. Performed at [facility], [place] [address] [place], [place] [address]    Culture GRAM NEGATIVE RODS  Final   Report Status PENDING  Incomplete  Culture, blood (routine x 2)     Status: None (Preliminary result)   Collection Time: 07/01/19  2:31 PM   Specimen: BLOOD LEFT ARM  Result Value Ref Range Status   Specimen Description BLOOD LEFT ARM  Final   Special Requests   Final    BOTTLES DRAWN AEROBIC AND ANAEROBIC Blood Culture adequate volume   Culture  Setup Time   Final    GRAM NEGATIVE RODS ANAEROBIC BOTTLE ONLY CRITICAL RESULT CALLED TO, READ BACK BY AND VERIFIED WITH: PHARMD G ABBOTT [id number] AT [id number] AM BY CM Performed at [facility], [place]  [address], [place], [place] [address]    Culture GRAM NEGATIVE RODS  Final   Report Status PENDING  Incomplete  Blood Culture ID Panel (Reflexed)     Status: Abnormal   Collection Time: 07/01/19  2:31 PM  Result Value Ref Range Status   Enterococcus species NOT DETECTED NOT DETECTED Final   Listeria monocytogenes NOT DETECTED NOT DETECTED Final   Staphylococcus species NOT DETECTED NOT  DETECTED Final   Staphylococcus aureus (BCID) NOT DETECTED NOT DETECTED Final   Streptococcus species NOT DETECTED NOT DETECTED Final   Streptococcus agalactiae NOT DETECTED NOT DETECTED Final   Streptococcus pneumoniae NOT DETECTED NOT DETECTED Final   Streptococcus pyogenes NOT DETECTED NOT DETECTED Final   Acinetobacter baumannii NOT DETECTED NOT DETECTED Final   Enterobacteriaceae species DETECTED (A) NOT DETECTED Final    Comment: Enterobacteriaceae represent a large family of gram-negative bacteria, not a single organism. CRITICAL RESULT CALLED TO, READ BACK BY AND VERIFIED WITH: PHARMD G ABBOTT [id number] AT 64 AM Y CM    Enterobacter cloacae complex NOT DETECTED NOT DETECTED Final   Escherichia coli DETECTED (A) NOT DETECTED Final    Comment: CRITICAL RESULT CALLED TO, READ BACK BY AND VERIFIED WITH: PHARMD G ABBOTT [id number] AT [id number] AM BY CM    Klebsiella oxytoca NOT DETECTED NOT DETECTED Final   Klebsiella pneumoniae NOT DETECTED NOT DETECTED Final   Proteus species NOT DETECTED NOT DETECTED Final   Serratia marcescens NOT DETECTED NOT DETECTED Final   Carbapenem resistance NOT DETECTED NOT DETECTED Final   Haemophilus influenzae NOT DETECTED NOT DETECTED Final   Neisseria meningitidis NOT DETECTED NOT DETECTED Final   Pseudomonas aeruginosa NOT DETECTED NOT DETECTED Final   Candida albicans NOT DETECTED NOT DETECTED Final   Candida glabrata NOT DETECTED NOT DETECTED Final   Candida krusei NOT DETECTED NOT DETECTED Final   Candida parapsilosis NOT DETECTED NOT DETECTED Final   Candida tropicalis NOT DETECTED NOT DETECTED Final    Comment: Performed at [facility], [place] [address], [place], [place] [address]  SARS CORONAVIRUS 2 (TAT 6-24 HRS) Nasopharyngeal Nasopharyngeal Swab     Status: None   Collection Time: 07/01/19  4:44 PM   Specimen: Nasopharyngeal Swab  Result Value Ref Range Status   SARS Coronavirus 2 NEGATIVE NEGATIVE Final    Comment: (NOTE) SARS-CoV-2 target  nucleic acids are NOT DETECTED. The SARS-CoV-2 RNA is generally detectable in upper and lower respiratory specimens during the acute phase of infection. Negative results do not preclude SARS-CoV-2 infection, do not rule out co-infections with other pathogens, and should not be used as the sole basis for treatment or other patient management decisions. Negative results must be combined with clinical observations, patient history, and epidemiological information. The expected result is Negative. Fact Sheet for Patients: SugarRoll.be Fact Sheet for Healthcare Providers: https://www.woods-mathews.com/ This test is not yet approved or cleared by the [place] FDA and  has been authorized for detection and/or diagnosis of SARS-CoV-2 by FDA under an Emergency Use Authorization (EUA). This EUA will remain  in effect (meaning this test can be used) for the duration of the COVID-19 declaration under Section 56 4(b)(1) of the Act, 21 U.S.C. section 360bbb-3(b)(1), unless the authorization is terminated or revoked sooner. Performed at [facility], [place] [address] [place], [place] [address]      Radiology Studies: CT Head Wo Contrast  Result Date: 07/01/2019 CLINICAL DATA:  Altered mental status. EXAM: CT HEAD WITHOUT CONTRAST TECHNIQUE: Contiguous axial images  were obtained from the base of the skull through the vertex without intravenous contrast. COMPARISON:  Head CT 08/18/2014 FINDINGS: Brain: Stable age related cerebral atrophy, ventriculomegaly and periventricular white matter disease. No extra-axial fluid collections are identified. No CT findings for acute hemispheric infarction or intracranial hemorrhage. No mass lesions. The brainstem and cerebellum are normal. Vascular: Vascular calcifications but no hyperdense vessels or obvious aneurysm. Skull: No skull fracture or bone lesions. Sinuses/Orbits: The paranasal sinuses and mastoid air cells  are clear. The globes are intact. Other: No scalp lesions or hematoma. IMPRESSION: 1. Stable age related cerebral atrophy, ventriculomegaly and periventricular white matter disease. 2. No acute intracranial findings or mass lesions. Electronically Signed   By: Marijo Sanes M.D.   On: 07/01/2019 13:43   DG Chest Portable 1 View  Result Date: 07/01/2019 CLINICAL DATA:  Altered mental status EXAM: PORTABLE CHEST 1 VIEW COMPARISON:  06/30/2019 FINDINGS: The heart size and mediastinal contours are within normal limits. Hiatal hernia. Both lungs are clear. The visualized skeletal structures are unremarkable. IMPRESSION: 1.  No acute abnormality of the lungs. 2.  Hiatal hernia. Electronically Signed   By: Eddie Candle M.D.   On: 07/01/2019 13:19    Scheduled Meds: . calcium-vitamin D  1 tablet Oral QODAY  . heparin  5,000 Units Subcutaneous Q8H  . letrozole  2.5 mg Oral Daily  . pantoprazole  40 mg Oral Daily   Continuous Infusions: . cefTRIAXone (ROCEPHIN)  IV 2 g (07/02/19 0956)  . lactated ringers       LOS: 1 day   Marylu Lund, MD [facility] Pager On Amion  If 7PM-7AM, please contact night-coverage 07/02/2019, 2:59 PM

## 2019-07-02 NOTE — Consult Note (Addendum)
Cardiology Consultation:   Patient ID: [name] [name] MRN: [id number]; DOB: 1941-03-01  Admit date: 07/01/2019 Date of Consult: 07/02/2019  Primary Care Provider: Jerrol [name]., MD Primary Cardiologist: Dr. Nehemiah [name] [facility])  Patient Profile:   [name] [name] is a 79 y.o. female with a hx of CAD s/p remote PCI, hypertension, hyperlipidemia, breast cancer who is being seen today for the evaluation of syncope at the request of Dr. Roosevelt [name].    Normal exercise myoview 02/2018. Echo 09/2018: LVEF 60-65%, moderate LVH  Last seen by primary cardiologist November 2020.  Admitted 4/54/9 with failure secondary to urethral obstruction.  History of Present Illness:   [name] [name] admitted to [facility] 4/21-4/22 with syncope after being placed on Foley for urinary retention as outpatient.  Felt syncope secondary to vasovagal due to obstruction diuresis.  Per note had a short run of PSVT (1  Episode of PSVT for 8 beats).  She had recurrent multiple syncope yesterday and came to ER.  1 time found unresponsive by husband looking cold and clammy.  At that time while transferred from wheelchair to car.  She was hypotensive. Admitted for sepsis with complicated UTI and urine culture growing E.coli. Now blood culture growing E.coli. On Rocephine per pharmacy. Lactic acid normalized. Scr almost back to normal. COVID negative. CT of head without acute findings.    Past Medical History:  Diagnosis Date  . Breast cancer ([place]) 2018   Right breast  . 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 Mucin noted on biopsy, not on wide excision.  . Coronary artery disease   . GERD (gastroesophageal reflux disease)   . Hyperlipidemia   . Hypertension   . 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 Dr. Kellie Moor DX by punch bioppsy  . Lichen planus    right breast   . Lichen planus   . MI (myocardial infarction)  ([place])    2015  . Personal history of radiation therapy 09/2016   RIGHT lumpectomy w/ radiation    Past Surgical History:  Procedure Laterality Date  . ABDOMINAL HYSTERECTOMY    . APPENDECTOMY    . BREAST BIOPSY Right 07/23/2016   INVASIVE MAMMARY CARCINOMA WITH AREAS OF EXTRACELLULAR MUCIN  . BREAST EXCISIONAL BIOPSY     INVASIVE MUCINOUS MAMMARY CARCINOMA.   Marland Kitchen BREAST LUMPECTOMY Right 08/20/2016   INVASIVE MUCINOUS MAMMARY CARCINOMA. /Grade 2   . COLONOSCOPY  2016  . COLONOSCOPY N/A 09/06/2018   Procedure: COLONOSCOPY;  Surgeon: Virgel Manifold, MD;  Location: [facility] ENDOSCOPY;  Service: Endoscopy;  Laterality: N/A;  . CORONARY ANGIOPLASTY WITH STENT PLACEMENT    . CYSTOSCOPY    . CYSTOSCOPY WITH URETHRAL DILATATION N/A 06/12/2019   Procedure: CYSTOSCOPY WITH URETHRAL DILATATION;  Surgeon: Abbie Sons, MD;  Location: [facility] ORS;  Service: Urology;  Laterality: N/A;  . ESOPHAGOGASTRODUODENOSCOPY N/A 09/06/2018   Procedure: ESOPHAGOGASTRODUODENOSCOPY (EGD);  Surgeon: Virgel Manifold, MD;  Location: [facility] ENDOSCOPY;  Service: Endoscopy;  Laterality: N/A;  . OOPHORECTOMY    . PARTIAL MASTECTOMY WITH AXILLARY SENTINEL LYMPH NODE BIOPSY Right 08/09/2016   Procedure: PARTIAL MASTECTOMY WITH AXILLARY SENTINEL LYMPH NODE BIOPSY;  Surgeon: Robert Bellow, MD;  Location: [facility] ORS;  Service: General;  Laterality: Right;  . TONSILLECTOMY       Inpatient Medications: Scheduled Meds: . calcium-vitamin D  1 tablet Oral QODAY  . heparin  5,000 Units Subcutaneous Q8H  . letrozole  2.5 mg Oral Daily  . pantoprazole  40 mg Oral Daily   Continuous Infusions: . cefTRIAXone (ROCEPHIN)  IV 2 g (07/02/19 0956)  . lactated ringers     PRN Meds: acetaminophen, hydrALAZINE, senna-docusate  Allergies:    Allergies  Allergen Reactions  . Celebrex  [Celecoxib]     Dark stools.  . Penicillins Other (See Comments)    Childhood reaction Has patient had a PCN reaction causing immediate rash,  facial/tongue/throat swelling, SOB or lightheadedness with hypotension: Unknown Has patient had a PCN reaction causing severe rash involving mucus membranes or skin necrosis: Unknown Has patient had a PCN reaction that required hospitalization: Unknown Has patient had a PCN reaction occurring within the last 10 years: Unknown If all of the above answers are "NO", then may proceed with Cephalosporin use.   Marland Kitchen Prevacid [Lansoprazole] Diarrhea    Social History:   Social History   Socioeconomic History  . Marital status: Married    Spouse name: [name]  . Number of children: 2  . Years of education: Not on file  . Highest education level: High school graduate  Occupational History  . Occupation: retired  Tobacco Use  . Smoking status: Former Smoker    Packs/day: 0.25    Years: 10.00    Pack years: 2.50    Types: Cigarettes    Quit date: 03/06/1998    Years since quitting: 21.3  . Smokeless tobacco: Never Used  Substance and Sexual Activity  . Alcohol use: No  . Drug use: No  . Sexual activity: Yes    Birth control/protection: Surgical  Other Topics Concern  . Not on file  Social History Narrative  . Not on file   Social Determinants of Health   Financial Resource Strain:   . Difficulty of Paying Living Expenses:   Food Insecurity:   . Worried About Charity fundraiser in the Last Year:   . Arboriculturist in the Last Year:   Transportation Needs:   . Film/video editor (Medical):   Marland Kitchen Lack of Transportation (Non-Medical):   Physical Activity:   . Days of Exercise per Week:   . Minutes of Exercise per Session:   Stress:   . Feeling of Stress :   Social Connections:   . Frequency of Communication with Friends and Family:   . Frequency of Social Gatherings with Friends and Family:   . Attends Religious Services:   . Active Member of Clubs or Organizations:   . Attends Archivist Meetings:   Marland Kitchen Marital Status:   Intimate Partner Violence:   . Fear of  Current or Ex-Partner:   . Emotionally Abused:   Marland Kitchen Physically Abused:   . Sexually Abused:     Family History:   Family History  Problem Relation Age of Onset  . Hyperlipidemia Mother   . Allergies Mother   . Cerebral aneurysm Mother        cause of death at age 36  . Heart disease Father        Fatal MI ag 45  . Cancer Brother        lung cancer  . Hyperlipidemia Brother   . Colonic polyp Brother   . Healthy Son   . Cancer - Lung Brother        colon  . Healthy Son   . Breast cancer Neg Hx      ROS:  Please see the history of present illness.  All other  ROS reviewed and negative.     Physical Exam/Data:   Vitals:   07/01/19 1732 07/01/19 2302 07/02/19 0435 07/02/19 0739  BP: 137/77 (!) 185/83 (!) 173/82 (!) 109/57  Pulse: 67 85 87 68  Resp: 18 20    Temp:  98.2 F (36.8 C) 99.6 F (37.6 C) 98.2 F (36.8 C)  TempSrc:   Oral   SpO2:  100% 100% 100%  Weight:      Height:        Intake/Output Summary (Last 24 hours) at 07/02/2019 1425 Last data filed at 07/02/2019 0500 Gross per 24 hour  Intake 1100 ml  Output 900 ml  Net 200 ml   Last 3 Weights 07/01/2019 06/27/2019 06/26/2019  Weight (lbs) 135 lb 130 lb 133 lb  Weight (kg) 61.236 kg 58.968 kg 60.328 kg     Body mass index is 23.17 kg/m.  General:  Thin frail elderly female  in no acute distress HEENT: normal Lymph: no adenopathy Neck: no JVD Endocrine:  No thryomegaly Vascular: No carotid bruits; FA pulses 2+ bilaterally without bruits  Cardiac:  normal S1, S2; RRR; no murmur  Lungs:  clear to auscultation bilaterally, no wheezing, rhonchi or rales  Abd: soft, nontender, no hepatomegaly  Ext: no edema Musculoskeletal:  No deformities, BUE and BLE strength normal and equal Skin: warm and dry  Neuro:  CNs 2-12 intact, no focal abnormalities noted Psych:  Normal affect   EKG:  The EKG was personally reviewed and demonstrates:  NSR @ 76 bpm with PVCs Telemetry:  Telemetry was personally reviewed and  demonstrates:  SR with PACs  Relevant CV Studies:  Echo 09/2018   1. The left ventricle has normal systolic function with an ejection  fraction of 60-65%. The cavity size was normal. There is moderately  increased left ventricular wall thickness. Left ventricular diastolic  Doppler parameters are consistent with impaired   relaxation.   2. The right ventricle has normal systolic function. The cavity was  normal. There is no increase in right ventricular wall thickness.   3. Left atrial size was mildly dilated.   4. The aortic valve was not well visualized. Aortic valve regurgitation  is trivial by color flow Doppler.   5. The interatrial septum was not assessed.   Exercise Myoview at [facility] 02/2018 Procedure: Exercise Myocardial Perfusion Imaging   ONE day procedure  Indication: Coronary artery disease involving native coronary artery of  native heart without angina pectoris Plan: NM myocardial perfusion SPECT multiple (stress        and rest), ECG stress test only  Ordering Physician:   Dr. Serafina Royals   Clinical History: 79 y.o. year old female Vitals: Height: 8 in  Weight: 148 lb Cardiac risk factors include:    PVD, Hyperlipidemia, Previous MI, Diabetes, PCI, HTN and CAD    Procedure: The patient performed treadmill exercise using a Bruce protocol for 4:00  minutes. The exercise test was stopped due to fatigue.  Blood pressure  response was normal. The patient developed symptoms other than fatigue  during the procedure; specific symptoms included SOB  Rest HR: 75bpm Rest BP: 130/20mmHg Max HR: 121bpm Max BP: 178/66mmHg Mets:     5.20 % MAX HR:   85%  Stress Test Administered by: Oswald Hillock, CMA  ECG Interpretation: Rest ECG:  normal sinus rhythm, none Stress ECG:  normal sinus rhythm,    Recovery ECG:  normal sinus rhythm ECG Interpretation:  negative, no ECG changes.   Administrations This  Visit    technetium Tc40m sestamibi (CARDIOLITE)  injection 123XX123 millicurie    Admin Date 02/07/2018 Action Given Dose [id number] millicurie Route Intravenous Administered By Herbert Seta, CNMT     technetium Tc75m sestamibi (CARDIOLITE) injection [id number] millicurie    Admin Date 02/07/2018 Action Given Dose [id number] millicurie Route Intravenous Administered By Herbert Seta, CNMT       Gated post-stress perfusion imaging was performed 30 minutes after stress.  Rest images were performed 30 minutes after injection.  Gated LV Analysis:  TID:  1.0  LVEF= 72%  FINDINGS: Regional wall motion:  reveals normal myocardial thickening and wall  motion. The overall quality of the study is good.   Artifacts noted: no Left ventricular cavity: normal.  Perfusion Analysis:  SPECT images demonstrate homogeneous tracer  distribution throughout the myocardium.    Laboratory Data:  High Sensitivity Troponin:   Recent Labs  Lab 06/12/19 0534 06/27/19 1241 06/27/19 1623 06/30/19 1110 06/30/19 1258  TROPONINIHS 51* 4 4 7 8      Chemistry Recent Labs  Lab 06/30/19 1110 07/01/19 1254 07/02/19 0538  NA 137 137 137  K 4.1 3.7 3.6  CL 103 102 105  CO2 25 24 23   GLUCOSE 115* 142* 106*  BUN 18 16 15   CREATININE 1.16* 1.45* 1.05*  CALCIUM 8.6* 8.8* 8.4*  GFRNONAA 44* 34* 50*  GFRAA 51* 39* 58*  ANIONGAP 9 11 9     Recent Labs  Lab 06/27/19 1241 06/30/19 1110 07/01/19 1254  PROT 6.4* 6.7 6.3*  ALBUMIN 3.3* 3.3* 2.8*  AST 16 16 36  ALT 13 10 15   ALKPHOS 55 48 50  BILITOT 0.9 1.3* 1.1   Hematology Recent Labs  Lab 06/30/19 1110 07/01/19 1254 07/02/19 0538  WBC 9.6 9.2 7.9  RBC 3.70* 3.89 3.56*  HGB 10.1* 10.4* 9.5*  HCT 32.2* 34.6* 30.8*  MCV 87.0 88.9 86.5  MCH 27.3 26.7 26.7  MCHC 31.4 30.1 30.8  RDW 20.3* 20.0* 19.5*  PLT 211 180 183   BNPNo results for input(s): BNP, PROBNP in the last 168 hours.  DDimer No results for input(s): DDIMER in the last 168 hours.   Radiology/Studies:  CT Head Wo  Contrast  Result Date: 07/01/2019 CLINICAL DATA:  Altered mental status. EXAM: CT HEAD WITHOUT CONTRAST TECHNIQUE: Contiguous axial images were obtained from the base of the skull through the vertex without intravenous contrast. COMPARISON:  Head CT 08/18/2014 FINDINGS: Brain: Stable age related cerebral atrophy, ventriculomegaly and periventricular white matter disease. No extra-axial fluid collections are identified. No CT findings for acute hemispheric infarction or intracranial hemorrhage. No mass lesions. The brainstem and cerebellum are normal. Vascular: Vascular calcifications but no hyperdense vessels or obvious aneurysm. Skull: No skull fracture or bone lesions. Sinuses/Orbits: The paranasal sinuses and mastoid air cells are clear. The globes are intact. Other: No scalp lesions or hematoma. IMPRESSION: 1. Stable age related cerebral atrophy, ventriculomegaly and periventricular white matter disease. 2. No acute intracranial findings or mass lesions. Electronically Signed   By: Marijo Sanes M.D.   On: 07/01/2019 13:43   DG Chest Portable 1 View  Result Date: 07/01/2019 CLINICAL DATA:  Altered mental status EXAM: PORTABLE CHEST 1 VIEW COMPARISON:  06/30/2019 FINDINGS: The heart size and mediastinal contours are within normal limits. Hiatal hernia. Both lungs are clear. The visualized skeletal structures are unremarkable. IMPRESSION: 1.  No acute abnormality of the lungs. 2.  Hiatal hernia. Electronically Signed   By: Eddie Candle M.D.   On:  07/01/2019 13:19   DG Chest Port 1 View  Result Date: 06/30/2019 CLINICAL DATA:  Patient not feeling well. EXAM: PORTABLE CHEST 1 VIEW COMPARISON:  June 11, 2019 FINDINGS: Moderate to large left hiatal hernia. The heart, hila, mediastinum are normal. No pneumothorax. No pulmonary nodules or masses. No focal infiltrates. IMPRESSION: Moderate to large hiatal hernia.  No other abnormalities. Electronically Signed   By: Dorise Bullion III M.D   On: 06/30/2019 12:15    {  Assessment and Plan:   1. Recurrent syncope Patient reports prior history of syncope with reassuring work-up.  She had at least 3-4 syncope this month.  She reports most of the time it happened when tried to stand up.  Patient is dealing with urinary retention now has UTI and blood culture growing E. Coli. - Telemetry shows sinus rhythm with rare PAC.  No arrhythmia found -Recommended PT eval with orthostasis -Consider long-term monitor with primary cardiologist -Consider echocardiogram  2.  CAD with remote PCI -No anginal symptoms - Last stress test December 2019 was normal  3.  E. coli bacteremia/UTI/urinary retention -Per primary team  Dr. Sallyanne Kuster to see  For questions or updates, please contact [facility] Please consult [email or web address] for contact info under     Jarrett Soho, PA  07/02/2019 2:25 PM   I have seen and examined the patient along with Leanor Kail, PA .  I have reviewed the chart, notes and new data.  I agree with PA/NP's note.  Key new complaints: History of syncope is highly suggestive of orthostatic hypotension/vasovagal events in the setting of urosepsis.  She was able to stand up with me today without experiencing dizziness or lightheadedness, but needed a lot of assistance due to weak legs and poor balance.  Her syncope has always been positional and does not have a pattern suggestive of arrhythmia. Key examination changes: Normal cardiovascular exam except for occasional ectopic beats (PACs on telemetry). Key new findings / data: Recent echocardiogram without any major structural abnormalities.  PLAN: While this not unreasonable for her to have an echocardiogram and a cardiac event monitor, I think these can safely be performed as an outpatient.  No plan for additional inpatient cardiac work-up.  Avoid medications that could worsen orthostatic hypotension, in particular diuretics and potent vasodilators.  All her  antihypertensive medications have been stopped at this time and her blood pressure remains relatively low.  It may be appropriate to stop or decrease decrease her home dose of lisinopril, depending on how blood pressure behaves over the next several days.  Sanda Klein, MD, [place] [phone] 07/02/2019, 4:41 PM

## 2019-07-02 NOTE — Progress Notes (Signed)
PHARMACY - PHYSICIAN COMMUNICATION CRITICAL VALUE ALERT - BLOOD CULTURE IDENTIFICATION (BCID)  [name] [name] is an 79 y.o. female who presented to [facility] on 07/01/2019 with a chief complaint of syncope.  Urine culture previous known to be growing E. coli  Assessment:   2/2 blood cultures growing E. Coli  Name of physician (or Provider) Contacted:  C Bodenheimer  Current antibiotics: Rocephin  Changes to prescribed antibiotics recommended:  Will increase Rocephin 2 g IV q24h  Results for orders placed or performed during the hospital encounter of 07/01/19  Blood Culture ID Panel (Reflexed) (Collected: 07/01/2019  2:31 PM)  Result Value Ref Range   Enterococcus species NOT DETECTED NOT DETECTED   Listeria monocytogenes NOT DETECTED NOT DETECTED   Staphylococcus species NOT DETECTED NOT DETECTED   Staphylococcus aureus (BCID) NOT DETECTED NOT DETECTED   Streptococcus species NOT DETECTED NOT DETECTED   Streptococcus agalactiae NOT DETECTED NOT DETECTED   Streptococcus pneumoniae NOT DETECTED NOT DETECTED   Streptococcus pyogenes NOT DETECTED NOT DETECTED   Acinetobacter baumannii NOT DETECTED NOT DETECTED   Enterobacteriaceae species DETECTED (A) NOT DETECTED   Enterobacter cloacae complex NOT DETECTED NOT DETECTED   Escherichia coli DETECTED (A) NOT DETECTED   Klebsiella oxytoca NOT DETECTED NOT DETECTED   Klebsiella pneumoniae NOT DETECTED NOT DETECTED   Proteus species NOT DETECTED NOT DETECTED   Serratia marcescens NOT DETECTED NOT DETECTED   Carbapenem resistance NOT DETECTED NOT DETECTED   Haemophilus influenzae NOT DETECTED NOT DETECTED   Neisseria meningitidis NOT DETECTED NOT DETECTED   Pseudomonas aeruginosa NOT DETECTED NOT DETECTED   Candida albicans NOT DETECTED NOT DETECTED   Candida glabrata NOT DETECTED NOT DETECTED   Candida krusei NOT DETECTED NOT DETECTED   Candida parapsilosis NOT DETECTED NOT DETECTED   Candida tropicalis NOT DETECTED NOT DETECTED     Caryl Pina 07/02/2019  6:42 AM

## 2019-07-03 ENCOUNTER — Ambulatory Visit: Payer: Self-pay | Admitting: Family Medicine

## 2019-07-03 LAB — URINE CULTURE: Culture: >=100000 — AB

## 2019-07-03 LAB — CBC
HCT: 34.3 % — ABNORMAL LOW (ref 36.0–46.0)
Hemoglobin: 10.5 g/dL — ABNORMAL LOW (ref 12.0–15.0)
MCH: 27.1 pg (ref 26.0–34.0)
MCHC: 30.6 g/dL (ref 30.0–36.0)
MCV: 88.4 fL (ref 80.0–100.0)
Platelets: 228 K/uL (ref 150–400)
RBC: 3.88 MIL/uL (ref 3.87–5.11)
RDW: 19.7 % — ABNORMAL HIGH (ref 11.5–15.5)
WBC: 6.1 K/uL (ref 4.0–10.5)
nRBC: 0 % (ref 0.0–0.2)

## 2019-07-03 LAB — BASIC METABOLIC PANEL
Anion gap: 11 (ref 5–15)
BUN: 16 mg/dL (ref 8–23)
CO2: 22 mmol/L (ref 22–32)
Calcium: 8.7 mg/dL — ABNORMAL LOW (ref 8.9–10.3)
Chloride: 103 mmol/L (ref 98–111)
Creatinine, Ser: 1.2 mg/dL — ABNORMAL HIGH (ref 0.44–1.00)
GFR calc Af Amer: 49 mL/min — ABNORMAL LOW (ref >60)
GFR calc non Af Amer: 43 mL/min — ABNORMAL LOW (ref >60)
Glucose, Bld: 111 mg/dL — ABNORMAL HIGH (ref 70–99)
Potassium: 3.4 mmol/L — ABNORMAL LOW (ref 3.5–5.1)
Sodium: 136 mmol/L (ref 135–145)

## 2019-07-03 MED ORDER — CHLORHEXIDINE GLUCONATE CLOTH 2 % EX PADS
6.0000 | MEDICATED_PAD | Freq: Every day | CUTANEOUS | Status: DC
  Administered 2019-07-04 – 2019-07-12 (×9): 6 via TOPICAL

## 2019-07-03 NOTE — Progress Notes (Signed)
PROGRESS NOTE    [name] [name]  [id number] DOB: [birth year] DOA: 07/01/2019 PCP: Jerrol Banana., MD    Brief Narrative:  79 y.o. female with medical history significant of breast cancer, coronary artery disease, essential hypertension, who presents to the hospital after recurrent syncope episode.  This morning, patient was sitting on her bed, husband brought her breakfast, and noticed patient was unresponsive, husband denied patient had pale looking or cold or clammy, no eye rolling or tongue bite, and she recovered in about 2-3 min, with confusion and headache. No loss control BM (She is on indwelling Foley).  Patient went to [facility] ER yesterday for feeling weakness and lightheaded, and feeling like she might pass out. Also 4 days ago, patient went to [facility] days ago, for another similar episode of syncope, when patient passed out in a car, and the patient did not remember any prodrome such as lightheaded or palpitations were nauseous, and she improved in short time. She did not have any symptoms afterwards. She did have some arrhythmia with 8 beats of SVT in [facility] ED which resolved and no more syncope or pre-syncope. Earlier this month, patient was in the hospital  with acute renal failure secondary to urethral obstruction. She was seen by urology, her renal function had recovered at time of discharge. She was able to urinate on her own at that time. She went to the urologist office yesterday for checkup, was found to have a significant urinary retention. A Foley catheter was anchored since.  Assessment & Plan:   Active Problems:   Sepsis ([place])   Sepsis with ecoli UTI and bacteremia present on admit, evidenced by end-organ impairment of confusion and syncope, low blood pressure and elevated lactic acid Recent urine culture growing E. Coli, resistant to cipro, ampicillin, bactrim Culture also growing klebsiella, resistant to ampicillin Blood cx pos for ecoli.  Cont with ceftriaxone for now Today, pt with concerns of feeling worse overnight. Would cont IV abx for now Repeat CBC in AM  Syncope Probably multifactorial from hypovolemia from sepsis plus minus arrhythmia Cardiology following. Recommendation for likely outpatient work up when discharged  Hypertension BP currently stable, cont to monitor for now  Complicated ecoli and kelbsiella UTI with ecoli bacteremia with indwelling Foley catheter Likely will need at least 7 to 10 days antibiotics given bacteremia and complicated UTI  Urethra obstruction Likely will go home with Foley in place Stable at this time  Acute on CKD stage II Cr improved from admit Will repeat bmet in AM  Shortened PR-Interval Cardiology was consulted at time of presentation  Breast CA Had been on hormone modification prior to admit  DVT prophylaxis: Heparin subq Code Status: DNR Family Communication: Pt in room, family not at bedside  Status is: Inpatient  Remains inpatient appropriate because:IV treatments appropriate due to intensity of illness or inability to take PO   Dispo: The patient is from: Home              Anticipated d/c is to: Home              Anticipated d/c date is: 1 day              Patient currently is not medically stable to d/c.  Consultants:   Cardiology  Procedures:     Antimicrobials: Anti-infectives (From admission, onward)   Start     Dose/Rate Route Frequency Ordered Stop   07/02/19 1000  cefTRIAXone (ROCEPHIN) 1 g in sodium chloride  0.9 % 100 mL IVPB  Status:  Discontinued     1 g 200 mL/hr over 30 Minutes Intravenous Every 24 hours 07/01/19 1538 07/01/19 1541   07/02/19 0800  cefTRIAXone (ROCEPHIN) 2 g in sodium chloride 0.9 % 100 mL IVPB     2 g 200 mL/hr over 30 Minutes Intravenous Every 24 hours 07/02/19 0643     07/01/19 1430  aztreonam (AZACTAM) 1 g in sodium chloride 0.9 % 100 mL IVPB  Status:  Discontinued     1 g 200 mL/hr over 30 Minutes  Intravenous  Once 07/01/19 1417 07/01/19 1419   07/01/19 1430  cefTRIAXone (ROCEPHIN) 1 g in sodium chloride 0.9 % 100 mL IVPB  Status:  Discontinued     1 g 200 mL/hr over 30 Minutes Intravenous Every 24 hours 07/01/19 1425 07/02/19 0643      Subjective: Reported feeling generally worse overnight  Objective: Vitals:   07/02/19 0739 07/02/19 1549 07/03/19 0747 07/03/19 1746  BP: (!) 109/57 137/81 (!) 147/76 (!) 175/87  Pulse: 68 76 75 86  Resp:   16 19  Temp: 98.2 F (36.8 C) 98.5 F (36.9 C)  97.6 F (36.4 C)  TempSrc:      SpO2: 100% 98% 100% 100%  Weight:      Height:        Intake/Output Summary (Last 24 hours) at 07/03/2019 1938 Last data filed at 07/03/2019 0600 Gross per 24 hour  Intake 120.56 ml  Output 650 ml  Net -529.44 ml   Filed Weights   07/01/19 1421  Weight: 61.2 kg    Examination: General exam: Awake, laying in bed, in nad Respiratory system: Normal respiratory effort, no wheezing Cardiovascular system: regular rate, s1, s2 Gastrointestinal system: Soft, nondistended, positive BS Central nervous system: CN2-12 grossly intact, strength intact Extremities: Perfused, no clubbing Skin: Normal skin turgor, no notable skin lesions seen Psychiatry: Mood normal // no visual hallucinations   Data Reviewed: I have personally reviewed following labs and imaging studies  CBC: Recent Labs  Lab 06/27/19 1241 06/30/19 1110 07/01/19 1254 07/02/19 0538 07/03/19 0235  WBC 9.1 9.6 9.2 7.9 6.1  NEUTROABS 6.9 7.6 7.4  --   --   HGB 10.0* 10.1* 10.4* 9.5* 10.5*  HCT 31.8* 32.2* 34.6* 30.8* 34.3*  MCV 86.2 87.0 88.9 86.5 88.4  PLT 235 211 180 183 [id number]   Basic Metabolic Panel: Recent Labs  Lab 06/27/19 1241 06/27/19 2040 06/28/19 0450 06/30/19 1110 07/01/19 1254 07/02/19 0538 07/03/19 0235  NA   < >  --  142 137 137 137 136  K   < >  --  4.1 4.1 3.7 3.6 3.4*  CL   < >  --  109 103 102 105 103  CO2   < >  --  25 25 24 23 22   GLUCOSE   < >  --  80  115* 142* 106* 111*  BUN   < >  --  27* 18 16 15 16   CREATININE   < >  --  1.08* 1.16* 1.45* 1.05* 1.20*  CALCIUM   < >  --  8.9 8.6* 8.8* 8.4* 8.7*  MG  --  2.2  --   --   --   --   --    < > = values in this interval not displayed.   GFR: Estimated Creatinine Clearance: 32.3 mL/min (A) (by C-G formula based on SCr of 1.2 mg/dL (H)). Liver Function Tests: Recent Labs  Lab 06/27/19 1241 06/30/19 1110 07/01/19 1254  AST 16 16 36  ALT 13 10 15   ALKPHOS 55 48 50  BILITOT 0.9 1.3* 1.1  PROT 6.4* 6.7 6.3*  ALBUMIN 3.3* 3.3* 2.8*   No results for input(s): LIPASE, AMYLASE in the last 168 hours. Recent Labs  Lab 07/01/19 1254  AMMONIA 11   Coagulation Profile: Recent Labs  Lab 07/01/19 1254  INR 1.1   Cardiac Enzymes: No results for input(s): CKTOTAL, CKMB, CKMBINDEX, TROPONINI in the last 168 hours. BNP (last 3 results) No results for input(s): PROBNP in the last 8760 hours. HbA1C: No results for input(s): HGBA1C in the last 72 hours. CBG: Recent Labs  Lab 07/01/19 1255  GLUCAP 122*   Lipid Profile: No results for input(s): CHOL, HDL, LDLCALC, TRIG, CHOLHDL, LDLDIRECT in the last 72 hours. Thyroid Function Tests: No results for input(s): TSH, T4TOTAL, FREET4, T3FREE, THYROIDAB in the last 72 hours. Anemia Panel: No results for input(s): VITAMINB12, FOLATE, FERRITIN, TIBC, IRON, RETICCTPCT in the last 72 hours. Sepsis Labs: Recent Labs  Lab 07/01/19 1254 07/01/19 1815 07/01/19 2138  LATICACIDVEN 2.2* 1.7 1.4    Recent Results (from the past 240 hour(s))  Respiratory Panel by RT PCR (Flu A&B, Covid) - Nasopharyngeal Swab     Status: None   Collection Time: 06/27/19  8:06 PM   Specimen: Nasopharyngeal Swab  Result Value Ref Range Status   SARS Coronavirus 2 by RT PCR NEGATIVE NEGATIVE Final    Comment: (NOTE) SARS-CoV-2 target nucleic acids are NOT DETECTED. The SARS-CoV-2 RNA is generally detectable in upper respiratoy specimens during the acute phase of  infection. The lowest concentration of SARS-CoV-2 viral copies this assay can detect is 131 copies/mL. A negative result does not preclude SARS-Cov-2 infection and should not be used as the sole basis for treatment or other patient management decisions. A negative result may occur with  improper specimen collection/handling, submission of specimen other than nasopharyngeal swab, presence of viral mutation(s) within the areas targeted by this assay, and inadequate number of viral copies (<131 copies/mL). A negative result must be combined with clinical observations, patient history, and epidemiological information. The expected result is Negative. Fact Sheet for Patients:  PinkCheek.be Fact Sheet for Healthcare Providers:  GravelBags.it This test is not yet ap proved or cleared by the [place] FDA and  has been authorized for detection and/or diagnosis of SARS-CoV-2 by FDA under an Emergency Use Authorization (EUA). This EUA will remain  in effect (meaning this test can be used) for the duration of the COVID-19 declaration under Section 564(b)(1) of the Act, 21 U.S.C. section 360bbb-3(b)(1), unless the authorization is terminated or revoked sooner.    Influenza A by PCR NEGATIVE NEGATIVE Final   Influenza B by PCR NEGATIVE NEGATIVE Final    Comment: (NOTE) The Xpert Xpress SARS-CoV-2/FLU/RSV assay is intended as an aid in  the diagnosis of influenza from Nasopharyngeal swab specimens and  should not be used as a sole basis for treatment. Nasal washings and  aspirates are unacceptable for Xpert Xpress SARS-CoV-2/FLU/RSV  testing. Fact Sheet for Patients: PinkCheek.be Fact Sheet for Healthcare Providers: GravelBags.it This test is not yet approved or cleared by the [place] FDA and  has been authorized for detection and/or diagnosis of SARS-CoV-2 by  FDA under  an Emergency Use Authorization (EUA). This EUA will remain  in effect (meaning this test can be used) for the duration of the  Covid-19 declaration under Section 564(b)(1) of the Act, 21  U.S.C. section 360bbb-3(b)(1), unless the authorization is  terminated or revoked. Performed at [facility], [address] [place], [place] [address]   Urine culture     Status: Abnormal   Collection Time: 06/30/19  1:45 PM   Specimen: Urine, Random  Result Value Ref Range Status   Specimen Description   Final    URINE, RANDOM Performed at [facility], [address] [place], [place] [address]    Special Requests   Final    NONE Performed at [facility], [address] [place], [place] [address]    Culture (A)  Final    >=100,000 COLONIES/mL ESCHERICHIA COLI 80,000 COLONIES/mL KLEBSIELLA PNEUMONIAE    Report Status 07/02/2019 FINAL  Final   Organism ID, Bacteria ESCHERICHIA COLI (A)  Final   Organism ID, Bacteria KLEBSIELLA PNEUMONIAE (A)  Final      Susceptibility   Escherichia coli - MIC*    AMPICILLIN >=32 RESISTANT Resistant     CEFAZOLIN <=4 SENSITIVE Sensitive     CEFTRIAXONE <=1 SENSITIVE Sensitive     CIPROFLOXACIN >=4 RESISTANT Resistant     GENTAMICIN <=1 SENSITIVE Sensitive     IMIPENEM <=0.25 SENSITIVE Sensitive     NITROFURANTOIN <=16 SENSITIVE Sensitive     TRIMETH/SULFA >=320 RESISTANT Resistant     AMPICILLIN/SULBACTAM 8 SENSITIVE Sensitive     PIP/TAZO <=4 SENSITIVE Sensitive     * >=100,000 COLONIES/mL ESCHERICHIA COLI   Klebsiella pneumoniae - MIC*    AMPICILLIN RESISTANT Resistant     CEFAZOLIN <=4 SENSITIVE Sensitive     CEFTRIAXONE <=1 SENSITIVE Sensitive     CIPROFLOXACIN <=0.25 SENSITIVE Sensitive     GENTAMICIN <=1 SENSITIVE Sensitive     IMIPENEM <=0.25 SENSITIVE Sensitive     NITROFURANTOIN 32 SENSITIVE Sensitive     TRIMETH/SULFA <=20 SENSITIVE Sensitive     AMPICILLIN/SULBACTAM 4 SENSITIVE Sensitive     PIP/TAZO <=4  SENSITIVE Sensitive     * 80,000 COLONIES/mL KLEBSIELLA PNEUMONIAE  Urine culture     Status: Abnormal   Collection Time: 07/01/19 12:54 PM   Specimen: Urine, Random  Result Value Ref Range Status   Specimen Description URINE, RANDOM  Final   Special Requests SITE NOT SPECIFIED  Final   Culture (A)  Final    >=100,000 COLONIES/mL ESCHERICHIA COLI >=100,000 COLONIES/mL KLEBSIELLA PNEUMONIAE    Report Status 07/03/2019 FINAL  Final   Organism ID, Bacteria ESCHERICHIA COLI (A)  Final   Organism ID, Bacteria KLEBSIELLA PNEUMONIAE (A)  Final      Susceptibility   Escherichia coli - MIC*    AMPICILLIN >=32 RESISTANT Resistant     CEFAZOLIN <=4 SENSITIVE Sensitive     CEFTRIAXONE <=1 SENSITIVE Sensitive     CIPROFLOXACIN >=4 RESISTANT Resistant     GENTAMICIN <=1 SENSITIVE Sensitive     IMIPENEM <=0.25 SENSITIVE Sensitive     NITROFURANTOIN <=16 SENSITIVE Sensitive     TRIMETH/SULFA >=320 RESISTANT Resistant     AMPICILLIN/SULBACTAM 8 SENSITIVE Sensitive     PIP/TAZO <=4 SENSITIVE Sensitive     * >=100,000 COLONIES/mL ESCHERICHIA COLI   Klebsiella pneumoniae - MIC*    AMPICILLIN RESISTANT Resistant     CEFAZOLIN <=4 SENSITIVE Sensitive     CEFTRIAXONE <=1 SENSITIVE Sensitive     CIPROFLOXACIN <=0.25 SENSITIVE Sensitive     GENTAMICIN <=1 SENSITIVE Sensitive     IMIPENEM <=0.25 SENSITIVE Sensitive     NITROFURANTOIN <=16 SENSITIVE Sensitive     TRIMETH/SULFA <=20 SENSITIVE Sensitive  AMPICILLIN/SULBACTAM 4 SENSITIVE Sensitive     PIP/TAZO <=4 SENSITIVE Sensitive     * >=100,000 COLONIES/mL KLEBSIELLA PNEUMONIAE  Culture, blood (routine x 2)     Status: None (Preliminary result)   Collection Time: 07/01/19  2:31 PM   Specimen: BLOOD  Result Value Ref Range Status   Specimen Description BLOOD LEFT ANTECUBITAL  Final   Special Requests   Final    BOTTLES DRAWN AEROBIC AND ANAEROBIC Blood Culture results may not be optimal due to an inadequate volume of blood received in culture  bottles   Culture  Setup Time   Final    GRAM NEGATIVE RODS ANAEROBIC BOTTLE ONLY CRITICAL VALUE NOTED.  VALUE IS CONSISTENT WITH PREVIOUSLY REPORTED AND CALLED VALUE. Performed at [facility], [place] [address] [place], [place] [address]    Culture GRAM NEGATIVE RODS  Final   Report Status PENDING  Incomplete  Culture, blood (routine x 2)     Status: Abnormal (Preliminary result)   Collection Time: 07/01/19  2:31 PM   Specimen: BLOOD LEFT ARM  Result Value Ref Range Status   Specimen Description BLOOD LEFT ARM  Final   Special Requests   Final    BOTTLES DRAWN AEROBIC AND ANAEROBIC Blood Culture adequate volume   Culture  Setup Time   Final    GRAM NEGATIVE RODS ANAEROBIC BOTTLE ONLY CRITICAL RESULT CALLED TO, READ BACK BY AND VERIFIED WITH: PHARMD G ABBOTT [id number] AT 50 AM BY CM    Culture (A)  Final    ESCHERICHIA COLI SUSCEPTIBILITIES TO FOLLOW Performed at [facility], [place] [address] [place], [place] [address]    Report Status PENDING  Incomplete  Blood Culture ID Panel (Reflexed)     Status: Abnormal   Collection Time: 07/01/19  2:31 PM  Result Value Ref Range Status   Enterococcus species NOT DETECTED NOT DETECTED Final   Listeria monocytogenes NOT DETECTED NOT DETECTED Final   Staphylococcus species NOT DETECTED NOT DETECTED Final   Staphylococcus aureus (BCID) NOT DETECTED NOT DETECTED Final   Streptococcus species NOT DETECTED NOT DETECTED Final   Streptococcus agalactiae NOT DETECTED NOT DETECTED Final   Streptococcus pneumoniae NOT DETECTED NOT DETECTED Final   Streptococcus pyogenes NOT DETECTED NOT DETECTED Final   Acinetobacter baumannii NOT DETECTED NOT DETECTED Final   Enterobacteriaceae species DETECTED (A) NOT DETECTED Final    Comment: Enterobacteriaceae represent a large family of gram-negative bacteria, not a single organism. CRITICAL RESULT CALLED TO, READ BACK BY AND VERIFIED WITH: PHARMD G ABBOTT [id number] AT 67 AM Y CM    Enterobacter  cloacae complex NOT DETECTED NOT DETECTED Final   Escherichia coli DETECTED (A) NOT DETECTED Final    Comment: CRITICAL RESULT CALLED TO, READ BACK BY AND VERIFIED WITH: PHARMD G ABBOTT [id number] AT [id number] AM BY CM    Klebsiella oxytoca NOT DETECTED NOT DETECTED Final   Klebsiella pneumoniae NOT DETECTED NOT DETECTED Final   Proteus species NOT DETECTED NOT DETECTED Final   Serratia marcescens NOT DETECTED NOT DETECTED Final   Carbapenem resistance NOT DETECTED NOT DETECTED Final   Haemophilus influenzae NOT DETECTED NOT DETECTED Final   Neisseria meningitidis NOT DETECTED NOT DETECTED Final   Pseudomonas aeruginosa NOT DETECTED NOT DETECTED Final   Candida albicans NOT DETECTED NOT DETECTED Final   Candida glabrata NOT DETECTED NOT DETECTED Final   Candida krusei NOT DETECTED NOT DETECTED Final   Candida parapsilosis NOT DETECTED NOT DETECTED Final   Candida tropicalis  NOT DETECTED NOT DETECTED Final    Comment: Performed at [facility], [place] [address], [place], [place] [address]  SARS CORONAVIRUS 2 (TAT 6-24 HRS) Nasopharyngeal Nasopharyngeal Swab     Status: None   Collection Time: 07/01/19  4:44 PM   Specimen: Nasopharyngeal Swab  Result Value Ref Range Status   SARS Coronavirus 2 NEGATIVE NEGATIVE Final    Comment: (NOTE) SARS-CoV-2 target nucleic acids are NOT DETECTED. The SARS-CoV-2 RNA is generally detectable in upper and lower respiratory specimens during the acute phase of infection. Negative results do not preclude SARS-CoV-2 infection, do not rule out co-infections with other pathogens, and should not be used as the sole basis for treatment or other patient management decisions. Negative results must be combined with clinical observations, patient history, and epidemiological information. The expected result is Negative. Fact Sheet for Patients: SugarRoll.be Fact Sheet for Healthcare  Providers: https://www.woods-mathews.com/ This test is not yet approved or cleared by the [place] FDA and  has been authorized for detection and/or diagnosis of SARS-CoV-2 by FDA under an Emergency Use Authorization (EUA). This EUA will remain  in effect (meaning this test can be used) for the duration of the COVID-19 declaration under Section 56 4(b)(1) of the Act, 21 U.S.C. section 360bbb-3(b)(1), unless the authorization is terminated or revoked sooner. Performed at [facility], [place] [address] [place], [place] [address]      Radiology Studies: No results found.  Scheduled Meds: . calcium-vitamin D  1 tablet Oral QODAY  . Chlorhexidine Gluconate Cloth  6 each Topical Daily  . heparin  5,000 Units Subcutaneous Q8H  . letrozole  2.5 mg Oral Daily  . pantoprazole  40 mg Oral Daily   Continuous Infusions: . cefTRIAXone (ROCEPHIN)  IV 2 g (07/03/19 0911)  . lactated ringers       LOS: 2 days   Marylu Lund, MD [facility] Pager On Amion  If 7PM-7AM, please contact night-coverage 07/03/2019, 7:38 PM

## 2019-07-04 DIAGNOSIS — N3090 Cystitis, unspecified without hematuria: Secondary | ICD-10-CM

## 2019-07-04 DIAGNOSIS — F0390 Unspecified dementia without behavioral disturbance: Secondary | ICD-10-CM

## 2019-07-04 DIAGNOSIS — B9689 Other specified bacterial agents as the cause of diseases classified elsewhere: Secondary | ICD-10-CM

## 2019-07-04 DIAGNOSIS — B9620 Unspecified Escherichia coli [E. coli] as the cause of diseases classified elsewhere: Secondary | ICD-10-CM

## 2019-07-04 DIAGNOSIS — R7881 Bacteremia: Secondary | ICD-10-CM

## 2019-07-04 DIAGNOSIS — I959 Hypotension, unspecified: Secondary | ICD-10-CM

## 2019-07-04 DIAGNOSIS — Z87898 Personal history of other specified conditions: Secondary | ICD-10-CM

## 2019-07-04 DIAGNOSIS — A419 Sepsis, unspecified organism: Secondary | ICD-10-CM

## 2019-07-04 LAB — COMPREHENSIVE METABOLIC PANEL
ALT: 18 U/L (ref 0–44)
AST: 25 U/L (ref 15–41)
Albumin: 2.6 g/dL — ABNORMAL LOW (ref 3.5–5.0)
Alkaline Phosphatase: 40 U/L (ref 38–126)
Anion gap: 19 — ABNORMAL HIGH (ref 5–15)
BUN: 15 mg/dL (ref 8–23)
CO2: 16 mmol/L — ABNORMAL LOW (ref 22–32)
Calcium: 8.9 mg/dL (ref 8.9–10.3)
Chloride: 107 mmol/L (ref 98–111)
Creatinine, Ser: 1.18 mg/dL — ABNORMAL HIGH (ref 0.44–1.00)
GFR calc Af Amer: 50 mL/min — ABNORMAL LOW (ref >60)
GFR calc non Af Amer: 44 mL/min — ABNORMAL LOW (ref >60)
Glucose, Bld: 106 mg/dL — ABNORMAL HIGH (ref 70–99)
Potassium: 3.8 mmol/L (ref 3.5–5.1)
Sodium: 142 mmol/L (ref 135–145)
Total Bilirubin: 0.6 mg/dL (ref 0.3–1.2)
Total Protein: 5.8 g/dL — ABNORMAL LOW (ref 6.5–8.1)

## 2019-07-04 LAB — CBC
HCT: 33.9 % — ABNORMAL LOW (ref 36.0–46.0)
Hemoglobin: 10.6 g/dL — ABNORMAL LOW (ref 12.0–15.0)
MCH: 27.4 pg (ref 26.0–34.0)
MCHC: 31.3 g/dL (ref 30.0–36.0)
MCV: 87.6 fL (ref 80.0–100.0)
Platelets: 269 10*3/uL (ref 150–400)
RBC: 3.87 MIL/uL (ref 3.87–5.11)
RDW: 19.4 % — ABNORMAL HIGH (ref 11.5–15.5)
WBC: 5 10*3/uL (ref 4.0–10.5)
nRBC: 0 % (ref 0.0–0.2)

## 2019-07-04 MED ORDER — HYDRALAZINE HCL 25 MG PO TABS
25.0000 mg | ORAL_TABLET | Freq: Four times a day (QID) | ORAL | Status: DC | PRN
  Administered 2019-07-06 – 2019-07-12 (×2): 25 mg via ORAL
  Filled 2019-07-04 (×2): qty 1

## 2019-07-04 MED ORDER — CEFAZOLIN SODIUM-DEXTROSE 2-4 GM/100ML-% IV SOLN
2.0000 g | Freq: Three times a day (TID) | INTRAVENOUS | Status: DC
  Administered 2019-07-04 – 2019-07-05 (×4): 2 g via INTRAVENOUS
  Filled 2019-07-04 (×5): qty 100

## 2019-07-04 NOTE — Evaluation (Signed)
Physical Therapy Evaluation Patient Details Name: [name] [name] MRN: [id number] DOB: 10-22-1940 Today's Date: 07/04/2019   History of Present Illness  79 yo female admitted to ED on 4/24 for weakness, lightheadedness, fatigue, and syncope; indwelling catheter placed PTA by urology for urinary retention. Pt with diagnosis of sepsis, suspect secondary to UTI. Pt with recent ED visit 4/21-4/22 for syncope. 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  Clinical Impression   Pt presents with LE weakness, R knee dysfunction (chronic per pt), unsteadiness in standing, difficulty performing bed mobility and transfer to standing, + orthostatics, and decreased activity tolerance. Pt to benefit from acute PT to address deficits. Pt required min-mod assist for bed mobility and transfer OOB to standing, further mobility limited by + orthostatics (see below) and pt weakness. PT recommending ST-SNF, vs HHPT with 24/7 assist including physical assist from husband. Pt with multiple recent admissions, appears to be weaker functionally at present. PT to progress mobility as tolerated, and will continue to follow acutely.   BP(MAP), HR:  - supine 155/66(89), 42 - sitting 91/60(71) 74 - standing 0 minutes 88/43(53), 85 - standing 3 minutes unable - return to supine post-LE exercise 125/77(88), 74    Follow Up Recommendations SNF;Supervision/Assistance - 24 hour(vs HHPT, pending pt progression with mobility and improvement of orthostatics)    Equipment Recommendations  None recommended by PT    Recommendations for Other Services       Precautions / Restrictions Precautions Precautions: Fall Precaution Comments: ORTHOSTATICS! Restrictions Weight Bearing Restrictions: No      Mobility  Bed Mobility Overal bed mobility: Needs Assistance Bed Mobility: Supine to Sit;Sit to Supine     Supine to sit: Min assist;HOB  elevated Sit to supine: Min assist;HOB elevated   General bed mobility comments: Min assist for trunk elevation off of bed, LE lifting to return to supine. Increased time and effort to work legs to EOB, use of bedrails.  Transfers Overall transfer level: Needs assistance Equipment used: Rolling walker (2 wheeled) Transfers: Sit to/from Stand Sit to Stand: Mod assist         General transfer comment: Mod assist for power up, steadying, hip extension with posterior pelvic facilitation, correcting posterior leaning in standing. Pt dizzy upon standing with decreased responsiveness to PT, + orthostatics (see assessment).  Ambulation/Gait             General Gait Details: pt able to take 2 steps towards [facility] with min assist to steady, unable to progress to gait  Stairs            Wheelchair Mobility    Modified Rankin (Stroke Patients Only)       Balance Overall balance assessment: Needs assistance Sitting-balance support: Feet supported Sitting balance-Leahy Scale: Good Sitting balance - Comments: able to sit EOB without PT support, tolerated LE MMT well without posterior LOB although slight posterior leaning present   Standing balance support: Bilateral upper extremity supported;During functional activity Standing balance-Leahy Scale: Poor Standing balance comment: reliant on external support                             Pertinent Vitals/Pain Pain Assessment: Faces Faces Pain Scale: Hurts a little bit Pain Location: abdomen Pain Descriptors / Indicators: Sore;Discomfort Pain Intervention(s): Limited activity within patient's tolerance;Monitored during session;Repositioned    Home Living Family/patient expects to be discharged to:: Private  residence Living Arrangements: Spouse/significant other Available Help at Discharge: Family;Available 24 hours/day Type of Home: House Home Access: Level entry     Home Layout: One level Home Equipment: Walker  - 2 wheels;Cane - single point;Hand held shower head      Prior Function Level of Independence: Needs assistance   Gait / Transfers Assistance Needed: pt reports using both cane and RW "all the time"  ADL's / Homemaking Assistance Needed: pt reports 0 falls, but has had episodes of syncope lately. Per pt, her husband does most of the cooking, and helps her with dressing and bathing only if she needs it        Hand Dominance   Dominant Hand: Right    Extremity/Trunk Assessment        Lower Extremity Assessment Lower Extremity Assessment: Generalized weakness;RLE deficits/detail RLE Deficits / Details: signficant crepitus with AROM/AAROM knee flex/ext, 3+/5 strength for hip flex/ext, abd/add, knee flex/ext; [facility] DF/PF    Cervical / Trunk Assessment Cervical / Trunk Assessment: Normal  Communication   Communication: No difficulties  Cognition Arousal/Alertness: Awake/alert Behavior During Therapy: WFL for tasks assessed/performed Overall Cognitive Status: No family/caregiver present to determine baseline cognitive functioning Area of Impairment: Following commands;Safety/judgement;Problem solving                       Following Commands: Follows one step commands with increased time Safety/Judgement: Decreased awareness of deficits;Decreased awareness of safety   Problem Solving: Slow processing;Decreased initiation;Difficulty sequencing;Requires tactile cues;Requires verbal cues General Comments: A&O x4, pt demonstrating increased processing time to respond to questions or demonstrate appropriate action requested. Pt tangential at times with conversation, and states things that appear to be unrelated to current conversation.      General Comments General comments (skin integrity, edema, etc.): BP(MAP), HR: supine 155/66(89), 42; sitting 91/60(71) 74; standing 0 minutes 88/43(53), 85; standing 3 minutes unable; return to supine post-LE exercise 125/77(88), 74     Exercises Total Joint Exercises Heel Slides: Both;10 reps;AAROM;Supine Hip ABduction/ADduction: AROM;Left;AAROM;Right;10 reps;Supine Straight Leg Raises: AAROM;Both;10 reps;Supine   Assessment/Plan    PT Assessment Patient needs continued PT services  PT Problem List Decreased strength;Decreased range of motion;Decreased activity tolerance;Decreased balance;Decreased mobility;Decreased coordination;Decreased knowledge of use of DME;Decreased safety awareness;Cardiopulmonary status limiting activity       PT Treatment Interventions DME instruction;Gait training;Functional mobility training;Therapeutic activities;Therapeutic exercise;Balance training;Neuromuscular re-education;Patient/family education;Stair training    PT Goals (Current goals can be found in the Care Plan section)  Acute Rehab PT Goals Patient Stated Goal: feel better PT Goal Formulation: With patient Time For Goal Achievement: 07/18/19 Potential to Achieve Goals: Good    Frequency Min 3X/week   Barriers to discharge        Co-evaluation               AM-PAC PT "6 Clicks" Mobility  Outcome Measure Help needed turning from your back to your side while in a flat bed without using bedrails?: A Little Help needed moving from lying on your back to sitting on the side of a flat bed without using bedrails?: A Little Help needed moving to and from a bed to a chair (including a wheelchair)?: A Lot Help needed standing up from a chair using your arms (e.g., wheelchair or bedside chair)?: A Lot Help needed to walk in hospital room?: A Lot Help needed climbing 3-5 steps with a railing? : Total 6 Click Score: 13    End of Session Equipment Utilized During Treatment: Gait  belt Activity Tolerance: Patient tolerated treatment well Patient left: in bed;with call bell/phone within reach;with bed alarm set;with nursing/sitter in room Nurse Communication: Mobility status;Other (comment)(+ orthostatics) PT Visit  Diagnosis: Unsteadiness on feet (R26.81);Muscle weakness (generalized) (M62.81)    Time: [id number] PT Time Calculation (min) (ACUTE ONLY): 26 min   Charges:   PT Evaluation $PT Eval Low Complexity: 1 Low PT Treatments $Therapeutic Activity: 8-22 mins        Annalia Metzger E, PT Acute Rehabilitation Services Pager [phone]  Office [phone]   Charon Smedberg D Elonda Husky 07/04/2019, 1:59 PM

## 2019-07-04 NOTE — TOC Initial Note (Signed)
Transition of Care [facility]) - Initial/Assessment Note    Patient Details  Name: [name] [name] MRN: [id number] Date of Birth: 09-17-1940  [facility] CM/SW Contact:    Pollie Friar, RN Phone Number: 07/04/2019, 1:44 PM  Clinical Narrative:                 Pt was set up with [facility] at last admission. Awaiting PT/OT evals.  Pt states she has supervision at home.  TOC following.  Expected Discharge Plan: [place] Barriers to Discharge: Continued Medical Work up   Patient Goals and CMS Choice        Expected Discharge Plan and Services Expected Discharge Plan: [place]       Living arrangements for the past 2 months: Single Family Home                                      Prior Living Arrangements/Services Living arrangements for the past 2 months: Single Family Home Lives with:: Spouse Patient language and need for interpreter reviewed:: Yes        Need for Family Participation in Patient Care: Yes (Comment) Care giver support system in place?: Yes (comment)(son and spouse) Current home services: DME(wheelchair, 3 in 1, cane, walker) Criminal Activity/Legal Involvement Pertinent to Current Situation/Hospitalization: No - Comment as needed  Activities of Daily Living Home Assistive Devices/Equipment: None ADL Screening (condition at time of admission) Patient's cognitive ability adequate to safely complete daily activities?: Yes Is the patient deaf or have difficulty hearing?: No Does the patient have difficulty seeing, even when wearing glasses/contacts?: No Does the patient have difficulty concentrating, remembering, or making decisions?: Yes Patient able to express need for assistance with ADLs?: Yes Does the patient have difficulty dressing or bathing?: No Independently performs ADLs?: Yes (appropriate for developmental age) Does the patient have difficulty walking or climbing stairs?: Yes Weakness  of Legs: Right Weakness of Arms/Hands: None  Permission Sought/Granted                  Emotional Assessment Appearance:: Appears stated age Attitude/Demeanor/Rapport: Engaged Affect (typically observed): Accepting Orientation: : Oriented to Self, Oriented to Place   Psych Involvement: No (comment)  Admission diagnosis:  Sepsis ([place]) [A41.9] Syncope, unspecified syncope type [R55] Urinary tract infection associated with indwelling urethral catheter, initial encounter ([place]) [id number], N39.0] Sepsis without acute organ dysfunction, due to unspecified organism [facility]) [A41.9] Patient Active Problem List   Diagnosis Date Noted  . Sepsis ([place]) 07/01/2019  . Syncope 06/27/2019  . History of breast cancer 06/12/2019  . Acute renal failure ([place]) 06/12/2019  . Generalized weakness 06/12/2019  . Hyperkalemia 06/12/2019  . Hypertensive urgency 06/12/2019  . Disorder of bursae of shoulder region 10/20/2018  . Lower GI bleeding 09/10/2018  . Hiatal hernia   . Melena   . Polyp of ascending colon   . Diverticulosis of large intestine without diverticulitis   . GIB (gastrointestinal bleeding) 09/05/2018  . LVH (left ventricular hypertrophy) due to hypertensive disease, without heart failure 04/10/2018  . Chest pain 03/31/2018  . Vasovagal syncope 03/20/2018  . Lichen planus [id number]  . Malignant neoplasm of upper-inner quadrant of right breast in female, estrogen receptor positive ([place]) 07/28/2016  . Gastric ulcer requiring drug therapy, chronic 01/23/2016  . MI (mitral incompetence) 07/22/2015  . Combined fat and carbohydrate induced hyperlipemia 12/26/2014  .  UTI (lower urinary tract infection) 08/18/2014  . Blurry vision 08/18/2014  . Allergic rhinitis 07/12/2014  . Absolute anemia 07/12/2014  . Baker's cyst of knee 07/12/2014  . Atherosclerosis of coronary artery 07/12/2014  . CAFL (chronic airflow limitation) ([place]) 07/12/2014  . Dizziness 07/12/2014  . Acid reflux  07/12/2014  . 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 History of colon polyps 07/12/2014  . Hypercholesteremia 07/12/2014  . Malaise and fatigue 07/12/2014  . Heart attack ([place]) 07/12/2014  . Muscle ache 07/12/2014  . Arthritis, degenerative 07/12/2014  . Bradycardia 08/23/2013  . CAD (coronary artery disease) 08/17/2013  . Diabetes ([place]) 08/17/2013  . Diabetes mellitus ([place]) 08/17/2013  . Peripheral vascular disease ([place]) 08/17/2013   PCP:  Jerrol Banana., MD Pharmacy:   [id number] [address], [place] [address] Phone: [phone] Fax: [phone]  [facility]) - [place], [place] [address] Phone: [phone] Fax: [phone]     Social Determinants of Health (SDOH) Interventions    Readmission Risk Interventions No flowsheet data found.

## 2019-07-04 NOTE — Progress Notes (Signed)
PROGRESS NOTE  [name] [name] [id number] DOB: [birth year]   PCP: Jerrol Banana., MD  Patient is from: home.  Uses walker at baseline.  Lives with son and husband.  DOA: 07/01/2019 LOS: 3  Brief Narrative / Interim history: 79 y.o.femalewith history of breast cancer, CAD, HTN and dementia presenting with recurrent syncopal episode and admitted for syncope and sepsis due to E. coli bacteremia, and E. coli and Klebsiella UTI.  She was treated with ceftriaxone and transitioned to Ancef based on sensitivity.  Patient was previously hospitalized with AKI in the setting of ureteral obstruction requiring Foley catheter about a month ago.  At that time, Foley was discontinued on discharge.  However, she was found to have acute urinary tension when she presented to the urology office for follow-up prior to this admission.  This admission, patient was found to have E. coli and Klebsiella UTI as well as E. coli bacteremia.   Cardiology consult on this admission and recommended outpatient follow-up and signed off.  Patient found to have significant orthostasis during evaluation by PT on 4/28.  PT recommended SNF.  Subjective: Seen and examined earlier this morning.  Patient son at bedside.  No major events overnight of this morning.  Patient does not seem to be a reliable historian.  She is only oriented to self.  She has no complaints but asking about pacemaker and carotid ultrasound.  Objective: Vitals:   07/03/19 1746 07/03/19 2147 07/04/19 0600 07/04/19 0747  BP: (!) 175/87 (!) 163/78 (!) 150/83 (!) 163/92  Pulse: 86   86  Resp: 19 16 18 18   Temp: 97.6 F (36.4 C) 98.6 F (37 C) 97.7 F (36.5 C) 98 F (36.7 C)  TempSrc:  Oral Oral   SpO2: 100% 100% 98% 99%  Weight:      Height:        Intake/Output Summary (Last 24 hours) at 07/04/2019 1425 Last data filed at 07/04/2019 0341 Gross per 24 hour  Intake 180 ml  Output 800 ml  Net -620 ml   Filed Weights   07/01/19 1421   Weight: 61.2 kg    Examination:  GENERAL: No apparent distress. Nontoxic.  HEENT: MMM.  Vision and hearing grossly intact.  NECK: Supple.  No apparent JVD.  RESP: On room air.  No IWOB. Good air movement bilaterally. CVS:  RRR. Heart sounds normal.  ABD/GI/GU: BS present. Soft. Non tender.  MSK/EXT:  Moves extremities. No apparent deformity. No edema.  SKIN: no apparent skin lesion or wound NEURO: Awake, alert and oriented only to self.  Follows commands appropriately.  No apparent focal neuro deficit. PSYCH: Calm. Normal affect.  Procedures:  None  Microbiology summarized: 4/25-COVID-19 PCR negative.   4/25-blood culture with E. Coli. 4/25-urine culture with E. coli and Klebsiella-both sensitive to cefazolin.   Assessment & Plan: Severe sepsis due to catheter associated ecoli UTI and bacteremia, and Klebsiella UTI: POA.  evidence of endorgan damage including confusion, syncope, hypotension and lactic acidosis on admission. -Sepsis physiology resolved. -Ceftriaxone> Omnicef  Recurrent syncope with significant orthostasis-could have some autonomic dysregulation -Cardiology recommending outpatient follow-up. -Apply TED hose  Hypertension: On lisinopril at home.  -Hold home lisinopril. -As needed hydralazine for standing SBP above 160 or standing DBP above 100  Urethra obstruction-with indwelling Foley catheter inserted by urology. -Likely discharge home with indwelling Foley catheter for outpatient urology follow-up.  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  Likely due to sepsis and hypovolemia. -Continue holding home lisinopril -Avoid nephrotoxic meds -Continue  monitoring  Shortened PR-Interval -Outpatient follow-up with cardiology.  Breast CA -continue home Femara  Cognitive impairment/dementia without behavioral disturbance. -Frequent orientation and delirium precautions  Debility/physical deconditioning-uses walker at baseline. -PT/OT-recommended SNF.  Goal of  care/DNR/DNI               DVT prophylaxis: Subcu heparin Code Status: DNR/DNI Family Communication: Updated patient's son at bedside.  Discharge barrier: Patient with significant orthostasis.  On IV antibiotics for UTI and bacteremia as well.  Therapy recommending SNF. Patient is from: Home Final disposition: SNF in the next 2 to 3 days  Consultants:  Cardiology   Sch Meds:  Scheduled Meds: . calcium-vitamin D  1 tablet Oral QODAY  . Chlorhexidine Gluconate Cloth  6 each Topical Daily  . heparin  5,000 Units Subcutaneous Q8H  . letrozole  2.5 mg Oral Daily  . pantoprazole  40 mg Oral Daily   Continuous Infusions: .  ceFAZolin (ANCEF) IV 2 g (07/04/19 1202)  . lactated ringers     PRN Meds:.acetaminophen, hydrALAZINE, senna-docusate  Antimicrobials: Anti-infectives (From admission, onward)   Start     Dose/Rate Route Frequency Ordered Stop   07/04/19 0900  ceFAZolin (ANCEF) IVPB 2g/100 mL premix     2 g 200 mL/hr over 30 Minutes Intravenous Every 8 hours 07/04/19 0847     07/02/19 1000  cefTRIAXone (ROCEPHIN) 1 g in sodium chloride 0.9 % 100 mL IVPB  Status:  Discontinued     1 g 200 mL/hr over 30 Minutes Intravenous Every 24 hours 07/01/19 1538 07/01/19 1541   07/02/19 0800  cefTRIAXone (ROCEPHIN) 2 g in sodium chloride 0.9 % 100 mL IVPB  Status:  Discontinued     2 g 200 mL/hr over 30 Minutes Intravenous Every 24 hours 07/02/19 0643 07/04/19 0847   07/01/19 1430  aztreonam (AZACTAM) 1 g in sodium chloride 0.9 % 100 mL IVPB  Status:  Discontinued     1 g 200 mL/hr over 30 Minutes Intravenous  Once 07/01/19 1417 07/01/19 1419   07/01/19 1430  cefTRIAXone (ROCEPHIN) 1 g in sodium chloride 0.9 % 100 mL IVPB  Status:  Discontinued     1 g 200 mL/hr over 30 Minutes Intravenous Every 24 hours 07/01/19 1425 07/02/19 0643       I have personally reviewed the following labs and images: CBC: Recent Labs  Lab 06/30/19 1110 07/01/19 1254 07/02/19 0538  07/03/19 0235 07/04/19 0137  WBC 9.6 9.2 7.9 6.1 5.0  NEUTROABS 7.6 7.4  --   --   --   HGB 10.1* 10.4* 9.5* 10.5* 10.6*  HCT 32.2* 34.6* 30.8* 34.3* 33.9*  MCV 87.0 88.9 86.5 88.4 87.6  PLT 211 180 183 228 269   BMP &GFR Recent Labs  Lab 06/27/19 2040 06/28/19 0450 06/30/19 1110 07/01/19 1254 07/02/19 0538 07/03/19 0235 07/04/19 0137  NA  --    < > 137 137 137 136 142  K  --    < > 4.1 3.7 3.6 3.4* 3.8  CL  --    < > 103 102 105 103 107  CO2  --    < > 25 24 23 22  16*  GLUCOSE  --    < > 115* 142* 106* 111* 106*  BUN  --    < > 18 16 15 16 15   CREATININE  --    < > 1.16* 1.45* 1.05* 1.20* 1.18*  CALCIUM  --    < > 8.6* 8.8* 8.4* 8.7* 8.9  MG 2.2  --   --   --   --   --   --    < > = values in this interval not displayed.   Estimated Creatinine Clearance: 32.8 mL/min (A) (by C-G formula based on SCr of 1.18 mg/dL (H)). Liver & Pancreas: Recent Labs  Lab 06/30/19 1110 07/01/19 1254 07/04/19 0137  AST 16 36 25  ALT 10 15 18   ALKPHOS 48 50 40  BILITOT 1.3* 1.1 0.6  PROT 6.7 6.3* 5.8*  ALBUMIN 3.3* 2.8* 2.6*   No results for input(s): LIPASE, AMYLASE in the last 168 hours. Recent Labs  Lab 07/01/19 1254  AMMONIA 11   Diabetic: No results for input(s): HGBA1C in the last 72 hours. Recent Labs  Lab 07/01/19 1255  GLUCAP 122*   Cardiac Enzymes: No results for input(s): CKTOTAL, CKMB, CKMBINDEX, TROPONINI in the last 168 hours. No results for input(s): PROBNP in the last 8760 hours. Coagulation Profile: Recent Labs  Lab 07/01/19 1254  INR 1.1   Thyroid Function Tests: No results for input(s): TSH, T4TOTAL, FREET4, T3FREE, THYROIDAB in the last 72 hours. Lipid Profile: No results for input(s): CHOL, HDL, LDLCALC, TRIG, CHOLHDL, LDLDIRECT in the last 72 hours. Anemia Panel: No results for input(s): VITAMINB12, FOLATE, FERRITIN, TIBC, IRON, RETICCTPCT in the last 72 hours. Urine analysis:    Component Value Date/Time   COLORURINE AMBER (A) 07/01/2019  1254   APPEARANCEUR CLOUDY (A) 07/01/2019 1254   APPEARANCEUR Cloudy (A) 04/25/2015 1449   LABSPEC 1.011 07/01/2019 1254   LABSPEC 1.021 01/17/2014 1524   PHURINE 6.0 07/01/2019 1254   GLUCOSEU NEGATIVE 07/01/2019 1254   GLUCOSEU Negative 01/17/2014 1524   HGBUR SMALL (A) 07/01/2019 1254   BILIRUBINUR NEGATIVE 07/01/2019 1254   BILIRUBINUR neg 06/30/2016 1619   BILIRUBINUR Negative 04/25/2015 1449   BILIRUBINUR Negative 01/17/2014 1524   KETONESUR NEGATIVE 07/01/2019 1254   PROTEINUR 100 (A) 07/01/2019 1254   UROBILINOGEN 0.2 06/30/2016 1619   NITRITE NEGATIVE 07/01/2019 1254   LEUKOCYTESUR LARGE (A) 07/01/2019 1254   LEUKOCYTESUR 2+ 01/17/2014 1524   Sepsis Labs: Invalid input(s): PROCALCITONIN, [place]  Microbiology: Recent Results (from the past 240 hour(s))  Respiratory Panel by RT PCR (Flu A&B, Covid) - Nasopharyngeal Swab     Status: None   Collection Time: 06/27/19  8:06 PM   Specimen: Nasopharyngeal Swab  Result Value Ref Range Status   SARS Coronavirus 2 by RT PCR NEGATIVE NEGATIVE Final    Comment: (NOTE) SARS-CoV-2 target nucleic acids are NOT DETECTED. The SARS-CoV-2 RNA is generally detectable in upper respiratoy specimens during the acute phase of infection. The lowest concentration of SARS-CoV-2 viral copies this assay can detect is 131 copies/mL. A negative result does not preclude SARS-Cov-2 infection and should not be used as the sole basis for treatment or other patient management decisions. A negative result may occur with  improper specimen collection/handling, submission of specimen other than nasopharyngeal swab, presence of viral mutation(s) within the areas targeted by this assay, and inadequate number of viral copies (<131 copies/mL). A negative result must be combined with clinical observations, patient history, and epidemiological information. The expected result is Negative. Fact Sheet for Patients:   PinkCheek.be Fact Sheet for Healthcare Providers:  GravelBags.it This test is not yet ap proved or cleared by the [place] FDA and  has been authorized for detection and/or diagnosis of SARS-CoV-2 by FDA under an Emergency Use Authorization (EUA). This EUA will remain  in effect (meaning this test can  be used) for the duration of the COVID-19 declaration under Section 564(b)(1) of the Act, 21 U.S.C. section 360bbb-3(b)(1), unless the authorization is terminated or revoked sooner.    Influenza A by PCR NEGATIVE NEGATIVE Final   Influenza B by PCR NEGATIVE NEGATIVE Final    Comment: (NOTE) The Xpert Xpress SARS-CoV-2/FLU/RSV assay is intended as an aid in  the diagnosis of influenza from Nasopharyngeal swab specimens and  should not be used as a sole basis for treatment. Nasal washings and  aspirates are unacceptable for Xpert Xpress SARS-CoV-2/FLU/RSV  testing. Fact Sheet for Patients: PinkCheek.be Fact Sheet for Healthcare Providers: GravelBags.it This test is not yet approved or cleared by the [place] FDA and  has been authorized for detection and/or diagnosis of SARS-CoV-2 by  FDA under an Emergency Use Authorization (EUA). This EUA will remain  in effect (meaning this test can be used) for the duration of the  Covid-19 declaration under Section 564(b)(1) of the Act, 21  U.S.C. section 360bbb-3(b)(1), unless the authorization is  terminated or revoked. Performed at [facility], [address] [place], [place] [address]   Urine culture     Status: Abnormal   Collection Time: 06/30/19  1:45 PM   Specimen: Urine, Random  Result Value Ref Range Status   Specimen Description   Final    URINE, RANDOM Performed at [facility], [address] [place], [place] [address]    Special Requests   Final    NONE Performed at [facility], [address] [place], [place] [address]    Culture (A)  Final    >=100,000 COLONIES/mL ESCHERICHIA COLI 80,000 COLONIES/mL KLEBSIELLA PNEUMONIAE    Report Status 07/02/2019 FINAL  Final   Organism ID, Bacteria ESCHERICHIA COLI (A)  Final   Organism ID, Bacteria KLEBSIELLA PNEUMONIAE (A)  Final      Susceptibility   Escherichia coli - MIC*    AMPICILLIN >=32 RESISTANT Resistant     CEFAZOLIN <=4 SENSITIVE Sensitive     CEFTRIAXONE <=1 SENSITIVE Sensitive     CIPROFLOXACIN >=4 RESISTANT Resistant     GENTAMICIN <=1 SENSITIVE Sensitive     IMIPENEM <=0.25 SENSITIVE Sensitive     NITROFURANTOIN <=16 SENSITIVE Sensitive     TRIMETH/SULFA >=320 RESISTANT Resistant     AMPICILLIN/SULBACTAM 8 SENSITIVE Sensitive     PIP/TAZO <=4 SENSITIVE Sensitive     * >=100,000 COLONIES/mL ESCHERICHIA COLI   Klebsiella pneumoniae - MIC*    AMPICILLIN RESISTANT Resistant     CEFAZOLIN <=4 SENSITIVE Sensitive     CEFTRIAXONE <=1 SENSITIVE Sensitive     CIPROFLOXACIN <=0.25 SENSITIVE Sensitive     GENTAMICIN <=1 SENSITIVE Sensitive     IMIPENEM <=0.25 SENSITIVE Sensitive     NITROFURANTOIN 32 SENSITIVE Sensitive     TRIMETH/SULFA <=20 SENSITIVE Sensitive     AMPICILLIN/SULBACTAM 4 SENSITIVE Sensitive     PIP/TAZO <=4 SENSITIVE Sensitive     * 80,000 COLONIES/mL KLEBSIELLA PNEUMONIAE  Urine culture     Status: Abnormal   Collection Time: 07/01/19 12:54 PM   Specimen: Urine, Random  Result Value Ref Range Status   Specimen Description URINE, RANDOM  Final   Special Requests SITE NOT SPECIFIED  Final   Culture (A)  Final    >=100,000 COLONIES/mL ESCHERICHIA COLI >=100,000 COLONIES/mL KLEBSIELLA PNEUMONIAE    Report Status 07/03/2019 FINAL  Final   Organism ID, Bacteria ESCHERICHIA COLI (A)  Final   Organism ID, Bacteria KLEBSIELLA PNEUMONIAE (A)  Final  Susceptibility   Escherichia coli - MIC*    AMPICILLIN >=32 RESISTANT Resistant     CEFAZOLIN <=4 SENSITIVE Sensitive      CEFTRIAXONE <=1 SENSITIVE Sensitive     CIPROFLOXACIN >=4 RESISTANT Resistant     GENTAMICIN <=1 SENSITIVE Sensitive     IMIPENEM <=0.25 SENSITIVE Sensitive     NITROFURANTOIN <=16 SENSITIVE Sensitive     TRIMETH/SULFA >=320 RESISTANT Resistant     AMPICILLIN/SULBACTAM 8 SENSITIVE Sensitive     PIP/TAZO <=4 SENSITIVE Sensitive     * >=100,000 COLONIES/mL ESCHERICHIA COLI   Klebsiella pneumoniae - MIC*    AMPICILLIN RESISTANT Resistant     CEFAZOLIN <=4 SENSITIVE Sensitive     CEFTRIAXONE <=1 SENSITIVE Sensitive     CIPROFLOXACIN <=0.25 SENSITIVE Sensitive     GENTAMICIN <=1 SENSITIVE Sensitive     IMIPENEM <=0.25 SENSITIVE Sensitive     NITROFURANTOIN <=16 SENSITIVE Sensitive     TRIMETH/SULFA <=20 SENSITIVE Sensitive     AMPICILLIN/SULBACTAM 4 SENSITIVE Sensitive     PIP/TAZO <=4 SENSITIVE Sensitive     * >=100,000 COLONIES/mL KLEBSIELLA PNEUMONIAE  Culture, blood (routine x 2)     Status: Abnormal (Preliminary result)   Collection Time: 07/01/19  2:31 PM   Specimen: BLOOD  Result Value Ref Range Status   Specimen Description BLOOD LEFT ANTECUBITAL  Final   Special Requests   Final    BOTTLES DRAWN AEROBIC AND ANAEROBIC Blood Culture results may not be optimal due to an inadequate volume of blood received in culture bottles   Culture  Setup Time   Final    GRAM NEGATIVE RODS ANAEROBIC BOTTLE ONLY CRITICAL VALUE NOTED.  VALUE IS CONSISTENT WITH PREVIOUSLY REPORTED AND CALLED VALUE.    Culture (A)  Final    ESCHERICHIA COLI SUSCEPTIBILITIES PERFORMED ON PREVIOUS CULTURE WITHIN THE LAST 5 DAYS. Performed at [facility], [place] [address], [place], [place] [address]    Report Status PENDING  Incomplete  Culture, blood (routine x 2)     Status: Abnormal (Preliminary result)   Collection Time: 07/01/19  2:31 PM   Specimen: BLOOD LEFT ARM  Result Value Ref Range Status   Specimen Description BLOOD LEFT ARM  Final   Special Requests   Final    BOTTLES DRAWN AEROBIC AND  ANAEROBIC Blood Culture adequate volume   Culture  Setup Time   Final    GRAM NEGATIVE RODS ANAEROBIC BOTTLE ONLY CRITICAL RESULT CALLED TO, READ BACK BY AND VERIFIED WITH: PHARMD G ABBOTT [id number] AT 37 AM BY CM Performed at [facility], [place] [address], [place], [place] [address]    Culture ESCHERICHIA COLI (A)  Final   Report Status PENDING  Incomplete   Organism ID, Bacteria ESCHERICHIA COLI  Final      Susceptibility   Escherichia coli - MIC*    AMPICILLIN >=32 RESISTANT Resistant     CEFAZOLIN <=4 SENSITIVE Sensitive     CEFEPIME <=1 SENSITIVE Sensitive     CEFTAZIDIME <=1 SENSITIVE Sensitive     CEFTRIAXONE <=1 SENSITIVE Sensitive     CIPROFLOXACIN >=4 RESISTANT Resistant     GENTAMICIN <=1 SENSITIVE Sensitive     IMIPENEM <=0.25 SENSITIVE Sensitive     TRIMETH/SULFA >=320 RESISTANT Resistant     AMPICILLIN/SULBACTAM 8 SENSITIVE Sensitive     PIP/TAZO <=4 SENSITIVE Sensitive     * ESCHERICHIA COLI  Blood Culture ID Panel (Reflexed)     Status: Abnormal   Collection Time: 07/01/19  2:31  PM  Result Value Ref Range Status   Enterococcus species NOT DETECTED NOT DETECTED Final   Listeria monocytogenes NOT DETECTED NOT DETECTED Final   Staphylococcus species NOT DETECTED NOT DETECTED Final   Staphylococcus aureus (BCID) NOT DETECTED NOT DETECTED Final   Streptococcus species NOT DETECTED NOT DETECTED Final   Streptococcus agalactiae NOT DETECTED NOT DETECTED Final   Streptococcus pneumoniae NOT DETECTED NOT DETECTED Final   Streptococcus pyogenes NOT DETECTED NOT DETECTED Final   Acinetobacter baumannii NOT DETECTED NOT DETECTED Final   Enterobacteriaceae species DETECTED (A) NOT DETECTED Final    Comment: Enterobacteriaceae represent a large family of gram-negative bacteria, not a single organism. CRITICAL RESULT CALLED TO, READ BACK BY AND VERIFIED WITH: PHARMD G ABBOTT [id number] AT 69 AM Y CM    Enterobacter cloacae complex NOT DETECTED NOT DETECTED Final    Escherichia coli DETECTED (A) NOT DETECTED Final    Comment: CRITICAL RESULT CALLED TO, READ BACK BY AND VERIFIED WITH: PHARMD G ABBOTT [id number] AT [id number] AM BY CM    Klebsiella oxytoca NOT DETECTED NOT DETECTED Final   Klebsiella pneumoniae NOT DETECTED NOT DETECTED Final   Proteus species NOT DETECTED NOT DETECTED Final   Serratia marcescens NOT DETECTED NOT DETECTED Final   Carbapenem resistance NOT DETECTED NOT DETECTED Final   Haemophilus influenzae NOT DETECTED NOT DETECTED Final   Neisseria meningitidis NOT DETECTED NOT DETECTED Final   Pseudomonas aeruginosa NOT DETECTED NOT DETECTED Final   Candida albicans NOT DETECTED NOT DETECTED Final   Candida glabrata NOT DETECTED NOT DETECTED Final   Candida krusei NOT DETECTED NOT DETECTED Final   Candida parapsilosis NOT DETECTED NOT DETECTED Final   Candida tropicalis NOT DETECTED NOT DETECTED Final    Comment: Performed at [facility], [place] [address] [place], [place] [address]  SARS CORONAVIRUS 2 (TAT 6-24 HRS) Nasopharyngeal Nasopharyngeal Swab     Status: None   Collection Time: 07/01/19  4:44 PM   Specimen: Nasopharyngeal Swab  Result Value Ref Range Status   SARS Coronavirus 2 NEGATIVE NEGATIVE Final    Comment: (NOTE) SARS-CoV-2 target nucleic acids are NOT DETECTED. The SARS-CoV-2 RNA is generally detectable in upper and lower respiratory specimens during the acute phase of infection. Negative results do not preclude SARS-CoV-2 infection, do not rule out co-infections with other pathogens, and should not be used as the sole basis for treatment or other patient management decisions. Negative results must be combined with clinical observations, patient history, and epidemiological information. The expected result is Negative. Fact Sheet for Patients: SugarRoll.be Fact Sheet for Healthcare Providers: https://www.woods-mathews.com/ This test is not yet approved or cleared by the  [place] FDA and  has been authorized for detection and/or diagnosis of SARS-CoV-2 by FDA under an Emergency Use Authorization (EUA). This EUA will remain  in effect (meaning this test can be used) for the duration of the COVID-19 declaration under Section 56 4(b)(1) of the Act, 21 U.S.C. section 360bbb-3(b)(1), unless the authorization is terminated or revoked sooner. Performed at [facility], [place] [address], [place], [place] [address]     Radiology Studies: No results found.    Aleece Loyd T. [place]  If 7PM-7AM, please contact night-coverage [email or web address] Password [email or web address] 07/04/2019, 2:25 PM

## 2019-07-05 DIAGNOSIS — I951 Orthostatic hypotension: Secondary | ICD-10-CM

## 2019-07-05 LAB — CBC
HCT: 31.9 % — ABNORMAL LOW (ref 36.0–46.0)
Hemoglobin: 10.2 g/dL — ABNORMAL LOW (ref 12.0–15.0)
MCH: 27.7 pg (ref 26.0–34.0)
MCHC: 32 g/dL (ref 30.0–36.0)
MCV: 86.7 fL (ref 80.0–100.0)
Platelets: 277 10*3/uL (ref 150–400)
RBC: 3.68 MIL/uL — ABNORMAL LOW (ref 3.87–5.11)
RDW: 19.3 % — ABNORMAL HIGH (ref 11.5–15.5)
WBC: 5.5 10*3/uL (ref 4.0–10.5)
nRBC: 0 % (ref 0.0–0.2)

## 2019-07-05 LAB — RENAL FUNCTION PANEL
Albumin: 2.6 g/dL — ABNORMAL LOW (ref 3.5–5.0)
Anion gap: 11 (ref 5–15)
BUN: 13 mg/dL (ref 8–23)
CO2: 24 mmol/L (ref 22–32)
Calcium: 8.9 mg/dL (ref 8.9–10.3)
Chloride: 108 mmol/L (ref 98–111)
Creatinine, Ser: 1.03 mg/dL — ABNORMAL HIGH (ref 0.44–1.00)
GFR calc Af Amer: 59 mL/min — ABNORMAL LOW (ref >60)
GFR calc non Af Amer: 51 mL/min — ABNORMAL LOW (ref >60)
Glucose, Bld: 102 mg/dL — ABNORMAL HIGH (ref 70–99)
Phosphorus: 3.4 mg/dL (ref 2.5–4.6)
Potassium: 3 mmol/L — ABNORMAL LOW (ref 3.5–5.1)
Sodium: 143 mmol/L (ref 135–145)

## 2019-07-05 LAB — MAGNESIUM: Magnesium: 1.8 mg/dL (ref 1.7–2.4)

## 2019-07-05 LAB — CULTURE, BLOOD (ROUTINE X 2): Special Requests: ADEQUATE

## 2019-07-05 LAB — CORTISOL-AM, BLOOD: Cortisol - AM: 14.4 ug/dL (ref 6.7–22.6)

## 2019-07-05 LAB — GLUCOSE, CAPILLARY: Glucose-Capillary: 105 mg/dL — ABNORMAL HIGH (ref 70–99)

## 2019-07-05 LAB — TSH: TSH: 1.018 u[IU]/mL (ref 0.350–4.500)

## 2019-07-05 MED ORDER — CEFDINIR 300 MG PO CAPS
300.0000 mg | ORAL_CAPSULE | Freq: Two times a day (BID) | ORAL | Status: DC
  Administered 2019-07-05 – 2019-07-06 (×2): 300 mg via ORAL
  Filled 2019-07-05 (×2): qty 1

## 2019-07-05 MED ORDER — POTASSIUM CHLORIDE CRYS ER 20 MEQ PO TBCR
40.0000 meq | EXTENDED_RELEASE_TABLET | ORAL | Status: AC
  Administered 2019-07-05 (×2): 40 meq via ORAL
  Filled 2019-07-05 (×3): qty 2

## 2019-07-05 NOTE — TOC Progression Note (Signed)
Transition of Care [facility]) - Progression Note    Patient Details  Name: [name] [name] MRN: [id number] Date of Birth: 1941-01-16  Transition of Care [facility]) CM/SW [place], [place] Phone Number: 07/05/2019, 11:08 AM  Clinical Narrative:     CSW spoke with pt and husband [name] at bedside. CSW, pt and [name] discussed PT/OT  recommendations of SNF. Pt and [name] stated they where fine with that. [name] stated that the pt has not been to SNF before and did not have a preferences. CSW explained that she can send information out to facilities in [place] and gave the pt and [name] the ratings list to choose which facility is best for them. [name] stated that this was a good idea.  CSW will continue to follow.   Expected Discharge Plan: [place] Barriers to Discharge: Continued Medical Work up  Expected Discharge Plan and Services Expected Discharge Plan: [place] arrangements for the past 2 months: Single Family Home                                       Social Determinants of Health (SDOH) Interventions    Readmission Risk Interventions Readmission Risk Prevention Plan 07/04/2019  Transportation Screening Complete  PCP or Specialist Appt within 3-5 Days Complete  HRI or [place] Complete  Medication Review ([place]) Referral to Pharmacy  Some recent data might be hidden   Emeterio Reeve, Latanya Presser, [place] Social Worker [phone]

## 2019-07-05 NOTE — Progress Notes (Signed)
Occupational Therapy Evaluation Patient Details Name: [name] [name] MRN: [id number] DOB: 06-06-1940 Today's Date: 07/05/2019    History of Present Illness 79 yo female admitted to ED on 4/24 for weakness, lightheadedness, fatigue, and syncope; indwelling catheter placed PTA by urology for urinary retention. Pt with diagnosis of sepsis, suspect secondary to UTI. Pt with recent ED visit 4/21-4/22 for syncope. 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   Clinical Impression   PTA, pt lived with her husband and used an AD for mobility. Husband helped as needed for ADL however pt states she was usually able to care for herself. Pt required Max to total A for bed mobility today, which is a significant functional decline from yesterday's PT session. Pt with significant BP drop when moving from supine (133/75) to sitting EOB (74/41) and complaining of "dizziness and not feeling well" therefore unable to mobilize OOB. Let in chair position @ bed level with BP 137/75. Pt not oriented to place or situation today (thinks she is at [facility]). MD and nsg notified of orthostasis. Will follow acutely. Pt will benefit from skilled OT services at SNF.     Follow Up Recommendations  Supervision/Assistance - 24 hour;SNF    Equipment Recommendations  3 in 1 bedside commode    Recommendations for Other Services       Precautions / Restrictions Precautions Precautions: Fall Precaution Comments: ORTHOSTATICS!      Mobility Bed Mobility Overal bed mobility: Needs Assistance Bed Mobility: Supine to Sit;Sit to Supine;Rolling Rolling: Mod assist   Supine to sit: Max assist Sit to supine: Total assist   General bed mobility comments: Pt unablet o initiate roll to sit; unable to problem sove how to sit up; required max A. On return to sidelying, pt was total A; symptomatic from drop in BP  Transfers                  General transfer comment: unable due to BP    Balance                                           ADL either performed or assessed with clinical judgement   ADL Overall ADL's : Needs assistance/impaired Eating/Feeding: Set up   Grooming: Set up;Sitting   Upper Body Bathing: Minimal assistance;Sitting   Lower Body Bathing: Moderate assistance;Bed level   Upper Body Dressing : Minimal assistance;Sitting   Lower Body Dressing: Bed level;Maximal assistance   Toilet Transfer: (unable to attempt)   Toileting- Clothing Manipulation and Hygiene: Maximal assistance Toileting - Clothing Manipulation Details (indicate cue type and reason): foley     Functional mobility during ADLs: (unable due to hypotension)       Vision Baseline Vision/History: Wears glasses       Perception     Praxis      Pertinent Vitals/Pain Pain Assessment: Faces Faces Pain Scale: No hurt     Hand Dominance Right   Extremity/Trunk Assessment Upper Extremity Assessment Upper Extremity Assessment: Generalized weakness   Lower Extremity Assessment Lower Extremity Assessment: Defer to PT evaluation   Cervical / Trunk Assessment Cervical / Trunk Assessment: Normal   Communication Communication Communication: No difficulties   Cognition Arousal/Alertness: Awake/alert Behavior During Therapy: WFL for tasks assessed/performed Overall Cognitive Status: No family/caregiver present to determine baseline cognitive  functioning Area of Impairment: Orientation;Memory;Safety/judgement;Awareness;Problem solving                 Orientation Level: Disoriented to;Place;Time;Situation   Memory: Decreased short-term memory Following Commands: Follows one step commands with increased time Safety/Judgement: Decreased awareness of safety;Decreased awareness of deficits Awareness: Emergent Problem Solving: Slow processing;Decreased initiation General Comments: Pt with  decreased orientation as compared to yesterday's session with PT. Not oriented to time, place (thinks she is at [facility] hospital) adn says she is here because of the fluid on her knee   General Comments  Pt enjoys making pies    Exercises Other Exercises Other Exercises: Bridging in bed to help scoot up to [employer]   Shoulder Instructions      Home Living Family/patient expects to be discharged to:: Private residence Living Arrangements: Spouse/significant other Available Help at Discharge: Family;Available 24 hours/day Type of Home: House Home Access: Level entry     Home Layout: One level     Bathroom Shower/Tub: Teacher, early years/pre: Handicapped height Bathroom Accessibility: Yes How Accessible: Accessible via walker Home Equipment: Meservey - 2 wheels;Cane - single point;Hand held shower head          Prior Functioning/Environment Level of Independence: Needs assistance  Gait / Transfers Assistance Needed: pt reports using both cane and RW "all the time" ADL's / Homemaking Assistance Needed: pt reports 0 falls, but has had episodes of syncope lately. Per pt, her husband does most of the cooking, and helps her with dressing and bathing only if she needs it            OT Problem List: Decreased strength;Decreased activity tolerance;Impaired balance (sitting and/or standing);Decreased cognition;Decreased safety awareness;Decreased knowledge of use of DME or AE;Cardiopulmonary status limiting activity      OT Treatment/Interventions: Self-care/ADL training;Therapeutic exercise;Neuromuscular education;Energy conservation;DME and/or AE instruction;Therapeutic activities;Cognitive remediation/compensation;Patient/family education;Balance training    OT Goals(Current goals can be found in the care plan section) Acute Rehab OT Goals Patient Stated Goal: feel better OT Goal Formulation: With patient Time For Goal Achievement: 07/19/19 Potential to Achieve Goals:  Good  OT Frequency: Min 2X/week   Barriers to D/C:            Co-evaluation              AM-PAC OT "6 Clicks" Daily Activity     Outcome Measure Help from another person eating meals?: A Little Help from another person taking care of personal grooming?: A Little Help from another person toileting, which includes using toliet, bedpan, or urinal?: A Lot Help from another person bathing (including washing, rinsing, drying)?: A Lot Help from another person to put on and taking off regular upper body clothing?: A Lot Help from another person to put on and taking off regular lower body clothing?: A Lot 6 Click Score: 14   End of Session Nurse Communication: Mobility status;Other (comment)(orthostatic)  Activity Tolerance: Treatment limited secondary to medical complications (Comment)(orthostatic hypotension) Patient left: in bed;with call bell/phone within reach;with bed alarm set(modified chair position)  OT Visit Diagnosis: Other abnormalities of gait and mobility (R26.89);Muscle weakness (generalized) (M62.81);Other symptoms and signs involving cognitive function;Dizziness and giddiness (R42)                Time: [id number] OT Time Calculation (min): 24 min Charges:  OT General Charges $OT Visit: 1 Visit OT Evaluation $OT Eval Moderate Complexity: 1 Mod OT Treatments $Therapeutic Activity: 8-22 mins  Matisha Termine, OT/L   Acute OT Clinical  Specialist [place] Pager [phone] Office [phone]   [facility] 07/05/2019, 12:53 PM

## 2019-07-05 NOTE — Progress Notes (Signed)
Physical Therapy Treatment Patient Details Name: [name] [name] MRN: [id number] DOB: February 18, 1941 Today's Date: 07/05/2019    History of Present Illness 79 yo female admitted to ED on 4/24 for weakness, lightheadedness, fatigue, and syncope; indwelling catheter placed PTA by urology for urinary retention. Pt with diagnosis of sepsis, suspect secondary to UTI. Pt with recent ED visit 4/21-4/22 for syncope. 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    PT Comments    Pt eager to get OOB upon PT arrival, states she wants to go to the church dinner as they are expecting her. Pt re-oriented to hospital and pt states "yes I know that" but pt continues to think she is next door to her church. Pt required increased physical assistance today and presents with markedly more confusion this session, pt appearing paranoid and insisting PT and pt whisper "so no one will know my business". Pt with no complaints of dizziness with transfer to stand today, unable to get BP due to heavy physical assist and posterior LOB during standing and attempted stepping. BP and HR in sitting 152/63 (87) and 94 bpm. PT continuing to recommend SNF level of care post-acutely.    Follow Up Recommendations  SNF;Supervision/Assistance - 24 hour     Equipment Recommendations  None recommended by PT    Recommendations for Other Services       Precautions / Restrictions Precautions Precautions: Fall Precaution Comments: ORTHOSTATICS! Restrictions Weight Bearing Restrictions: No    Mobility  Bed Mobility Overal bed mobility: Needs Assistance Bed Mobility: Supine to Sit;Sit to Supine     Supine to sit: Mod assist Sit to supine: Max assist;HOB elevated   General bed mobility comments: Mod assist for supine to sit for trunk elevation, LE lifting and translation to EOB, and scooting to EOB. Max assist for return to supine with pt resisting  due to wanting to walk out of room (although unable to take steps without posterior LOB).  Transfers Overall transfer level: Needs assistance Equipment used: Rolling walker (2 wheeled) Transfers: Sit to/from Stand Sit to Stand: Mod assist;From elevated surface         General transfer comment: Mod assist for power up, steadying, hip extension, correcting posterior leaning. Pt able to take single step forward only due to posterior LOB. Sit to stand x2, pt reporting no dizziness but PT unable to assess standing orthostatics due to pt unsafe to let go of.  Ambulation/Gait             General Gait Details: unable this day   Stairs             Wheelchair Mobility    Modified Rankin (Stroke Patients Only)       Balance Overall balance assessment: Needs assistance Sitting-balance support: Feet supported Sitting balance-Leahy Scale: Fair     Standing balance support: Bilateral upper extremity supported;During functional activity Standing balance-Leahy Scale: Poor Standing balance comment: reliant on external support, posterior LOB requiring PT assist to correct                            Cognition Arousal/Alertness: Awake/alert Behavior During Therapy: WFL for tasks assessed/performed Overall Cognitive Status: No family/caregiver present to determine baseline cognitive functioning Area of Impairment: Orientation;Memory;Safety/judgement;Awareness;Problem solving;Attention;Following commands                 Orientation Level: Disoriented to;Place;Time;Situation Current  Attention Level: Sustained;Focused Memory: Decreased short-term memory Following Commands: Follows one step commands with increased time;Follows one step commands inconsistently Safety/Judgement: Decreased awareness of safety;Decreased awareness of deficits Awareness: Emergent Problem Solving: Slow processing;Decreased initiation;Requires verbal cues;Requires tactile cues;Difficulty  sequencing General Comments: Pt states "let's go to the church, they are having a dinner over there (pointing to room next door)"; also whispers throughout session stating "I don't want them to know any of my business" but no one present. Pt re-oriented, but could not state where she was 1 minute later when asked. Lacks insight into present deficits, asking PT to walk her out of here.      Exercises General Exercises - Lower Extremity Long Arc Quad: AAROM;Both;10 reps;Seated    General Comments        Pertinent Vitals/Pain Pain Assessment: Faces Faces Pain Scale: Hurts a little bit Pain Location: R knee during mobility Pain Descriptors / Indicators: Sore;Discomfort Pain Intervention(s): Monitored during session;Limited activity within patient's tolerance;Repositioned    Home Living                      Prior Function            PT Goals (current goals can now be found in the care plan section) Acute Rehab PT Goals Patient Stated Goal: feel better PT Goal Formulation: With patient Time For Goal Achievement: 07/18/19 Potential to Achieve Goals: Good Progress towards PT goals: Progressing toward goals    Frequency    Min 3X/week      PT Plan Current plan remains appropriate    Co-evaluation              AM-PAC PT "6 Clicks" Mobility   Outcome Measure  Help needed turning from your back to your side while in a flat bed without using bedrails?: A Lot Help needed moving from lying on your back to sitting on the side of a flat bed without using bedrails?: A Lot Help needed moving to and from a bed to a chair (including a wheelchair)?: A Lot Help needed standing up from a chair using your arms (e.g., wheelchair or bedside chair)?: A Lot Help needed to walk in hospital room?: Total Help needed climbing 3-5 steps with a railing? : Total 6 Click Score: 10    End of Session Equipment Utilized During Treatment: Gait belt Activity Tolerance: Patient limited  by fatigue;Other (comment)(AMS) Patient left: in bed;with call bell/phone within reach;with bed alarm set Nurse Communication: Mobility status;Other (comment)(AMS) PT Visit Diagnosis: Unsteadiness on feet (R26.81);Muscle weakness (generalized) (M62.81)     Time: [id number] PT Time Calculation (min) (ACUTE ONLY): 18 min  Charges:  $Therapeutic Activity: 8-22 mins                     Ferdinand Revoir E, PT Acute Rehabilitation Services Pager [phone]  Office [phone]   Roxine Caddy D Elonda Husky 07/05/2019, 5:37 PM

## 2019-07-05 NOTE — NC FL2 (Signed)
[place] MEDICAID FL2 LEVEL OF CARE SCREENING TOOL     IDENTIFICATION  Patient Name: [name] [name] Birthdate: [birth year] Sex: female Admission Date (Current Location): 07/01/2019  [facility] and [place] Number:  Engineering geologist and Address:  [facility] [place] [address] [place], [place] [address]      Provider Number: [id number]  Attending Physician Name and Address:  Mercy Riding, MD  Relative Name and Phone Number:       Current Level of Care: Hospital Recommended Level of Care: [place] Prior Approval Number:    Date Approved/Denied:   PASRR Number: [id number]  Discharge Plan: SNF    Current Diagnoses: Patient Active Problem List   Diagnosis Date Noted  . Sepsis ([place]) 07/01/2019  . Syncope 06/27/2019  . History of breast cancer 06/12/2019  . Acute renal failure ([place]) 06/12/2019  . Generalized weakness 06/12/2019  . Hyperkalemia 06/12/2019  . Hypertensive urgency 06/12/2019  . Disorder of bursae of shoulder region 10/20/2018  . Lower GI bleeding 09/10/2018  . Hiatal hernia   . Melena   . Polyp of ascending colon   . Diverticulosis of large intestine without diverticulitis   . GIB (gastrointestinal bleeding) 09/05/2018  . LVH (left ventricular hypertrophy) due to hypertensive disease, without heart failure 04/10/2018  . Chest pain 03/31/2018  . Vasovagal syncope 03/20/2018  . Lichen planus [id number]  . Malignant neoplasm of upper-inner quadrant of right breast in female, estrogen receptor positive ([place]) 07/28/2016  . Gastric ulcer requiring drug therapy, chronic 01/23/2016  . MI (mitral incompetence) 07/22/2015  . Combined fat and carbohydrate induced hyperlipemia 12/26/2014  . UTI (lower urinary tract infection) 08/18/2014  . Blurry vision 08/18/2014  . Allergic rhinitis 07/12/2014  . Absolute anemia 07/12/2014  . Baker's cyst of knee 07/12/2014  . Atherosclerosis of coronary artery 07/12/2014  . CAFL  (chronic airflow limitation) ([place]) 07/12/2014  . Dizziness 07/12/2014  . Acid reflux 07/12/2014  . 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 History of colon polyps 07/12/2014  . Hypercholesteremia 07/12/2014  . Malaise and fatigue 07/12/2014  . Heart attack ([place]) 07/12/2014  . Muscle ache 07/12/2014  . Arthritis, degenerative 07/12/2014  . Bradycardia 08/23/2013  . CAD (coronary artery disease) 08/17/2013  . Diabetes ([place]) 08/17/2013  . Diabetes mellitus ([place]) 08/17/2013  . Peripheral vascular disease ([place]) 08/17/2013    Orientation RESPIRATION BLADDER Height & Weight     Self  Normal Incontinent Weight: 135 lb (61.2 kg) Height:  5\' 4"  (162.6 cm)  BEHAVIORAL SYMPTOMS/MOOD NEUROLOGICAL BOWEL NUTRITION STATUS      Incontinent Diet(See discharge summery)  AMBULATORY STATUS COMMUNICATION OF NEEDS Skin   Extensive Assist Verbally Normal                       Personal Care Assistance Level of Assistance  Bathing, Feeding, Dressing Bathing Assistance: Maximum assistance Feeding assistance: Limited assistance Dressing Assistance: Maximum assistance     Functional Limitations Info             SPECIAL CARE FACTORS FREQUENCY  OT (By licensed OT), PT (By licensed PT)     PT Frequency: 5x a week OT Frequency: 5x a week            Contractures Contractures Info: Not present    Additional Factors Info  Code Status, Allergies Code Status Info: DNR Allergies Info: Celebrex  Penicillins Prevacid           Current Medications (07/05/2019):  This is the current hospital active medication list Current Facility-Administered Medications  Medication Dose Route Frequency Provider Last Rate Last Admin  . acetaminophen (TYLENOL) tablet 650 mg  650 mg Oral Q8H PRN Lequita Halt, MD   650 mg at 07/04/19 2209  . calcium-vitamin D (OSCAL WITH D) 500-200 MG-UNIT per tablet 1 tablet  1 tablet Oral Verneda Skill, MD   1 tablet at 07/05/19 1001  . ceFAZolin (ANCEF) IVPB 2g/100 mL  premix  2 g Intravenous Q8H Gonfa, Taye T, MD 200 mL/hr at 07/05/19 0522 2 g at 07/05/19 0522  . Chlorhexidine Gluconate Cloth 2 % PADS 6 each  6 each Topical Daily Donne Hazel, MD   6 each at 07/05/19 0954  . heparin injection 5,000 Units  5,000 Units Subcutaneous Q8H Donne Hazel, MD   5,000 Units at 07/05/19 [id number]  . hydrALAZINE (APRESOLINE) tablet 25 mg  25 mg Oral Q6H PRN Wendee Beavers T, MD      . lactated ringers bolus 1,000 mL  1,000 mL Intravenous Once Isla Pence, MD      . letrozole [facility]) tablet 2.5 mg  2.5 mg Oral Daily Wynetta Fines T, MD   2.5 mg at 07/05/19 1001  . pantoprazole (PROTONIX) EC tablet 40 mg  40 mg Oral Daily Wynetta Fines T, MD   40 mg at 07/05/19 0954  . potassium chloride SA (KLOR-CON) CR tablet 40 mEq  40 mEq Oral Q3H Wendee Beavers T, MD   40 mEq at 07/05/19 0951  . senna-docusate (Senokot-S) tablet 2 tablet  2 tablet Oral BID PRN Lequita Halt, MD         Discharge Medications: Please see discharge summary for a list of discharge medications.  Relevant Imaging Results:  Relevant Lab Results:   Additional Information SSN [id number]  Emeterio Reeve, [place]

## 2019-07-05 NOTE — Progress Notes (Signed)
PROGRESS NOTE  [name] [name] [id number] DOB: 11/30/1940   PCP: Jerrol Banana., MD  Patient is from: home.  Uses walker at baseline.  Lives with son and husband.  DOA: 07/01/2019 LOS: 4  Brief Narrative / Interim history: 79 y.o.femalewith history of breast cancer, CAD, HTN and dementia presenting with recurrent syncopal episode and admitted for syncope and sepsis due to E. coli bacteremia, and E. coli and Klebsiella UTI.  She was treated with ceftriaxone and transitioned to Ancef based on sensitivity.  Patient was previously hospitalized with AKI in the setting of ureteral obstruction requiring Foley catheter about a month ago.  At that time, Foley was discontinued on discharge.  However, she was found to have acute urinary tension when she presented to the urology office for follow-up prior to this admission.  This admission, patient was found to have E. coli and Klebsiella UTI as well as E. coli bacteremia.   Cardiology consult on this admission and recommended outpatient follow-up and signed off.  Patient found to have significant orthostasis during evaluation by PT on 4/28.  PT recommended SNF.  Subjective: Seen and examined earlier this morning.  No major events overnight of this morning.  No complaints but not a reliable historian.  She is awake and alert but only oriented to self.  She says "I am at eating place".   Objective: Vitals:   07/04/19 0600 07/04/19 0747 07/04/19 1631 07/04/19 2000  BP: (!) 150/83 (!) 163/92 (!) 179/88 (!) 153/81  Pulse:  86 80 84  Resp: 18 18 19 18   Temp: 97.7 F (36.5 C) 98 F (36.7 C) 98.3 F (36.8 C) (!) 97.5 F (36.4 C)  TempSrc: Oral   Oral  SpO2: 98% 99% 98% 97%  Weight:      Height:        Intake/Output Summary (Last 24 hours) at 07/05/2019 1333 Last data filed at 07/05/2019 0400 Gross per 24 hour  Intake 340 ml  Output 700 ml  Net -360 ml   Filed Weights   07/01/19 1421  Weight: 61.2 kg     Examination:  GENERAL: Frail but no apparent distress.  Nontoxic. HEENT: MMM.  Vision and hearing grossly intact.  NECK: Supple.  No apparent JVD.  RESP: On room air.  No IWOB.  Fair aeration bilaterally. CVS:  RRR. Heart sounds normal.  ABD/GI/GU: Bowel sounds present. Soft. Non tender.  Indwelling Foley. MSK/EXT:  Moves extremities.  Global muscle mass and subcu fat loss. SKIN: no apparent skin lesion or wound NEURO: Awake, alert and oriented only to self.  Follows commands.  No apparent focal neuro deficit. PSYCH: Calm. Normal affect.  Procedures:  None  Microbiology summarized: 4/25-COVID-19 PCR negative.   4/25-blood culture with E. Coli. 4/25-urine culture with E. coli and Klebsiella-both sensitive to cefazolin.   Assessment & Plan: Severe sepsis due to catheter associated ecoli UTI and bacteremia, and Klebsiella UTI: POA.  evidence of endorgan damage including confusion, syncope, hypotension and lactic acidosis on admission. -Sepsis physiology resolved. -Ceftriaxone> Omnicef  Recurrent syncope/orthostatic hypotension-continues to have significant orthostasis.  Could have some autonomic dysregulation. -Orthostatic vitals every shift. -Cardiology recommending outpatient follow-up. -Apply TED hose-unfortunately not helping due to significant muscle mass/subcu fat loss.   Hypertension: On lisinopril at home.  -Hold home lisinopril. -As needed hydralazine for standing SBP above 160 or standing DBP above 100  Urethra obstruction-with indwelling Foley catheter inserted by urology. -Likely discharge home with indwelling Foley catheter for outpatient urology follow-up.  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  Improving. -Continue holding home lisinopril -Avoid nephrotoxic meds -Continue monitoring  Hypokalemia: Magnesium was normal. -Replenish and recheck.  Shortened PR-Interval -Outpatient follow-up with cardiology.  History of Breast  cancer -continue home [employer]  Cognitive impairment/dementia without behavioral disturbance: Awake and alert but only oriented to self. -Frequent orientation and delirium precautions  Debility/physical deconditioning-uses walker at baseline. -PT/OT-recommended SNF.  Goal of care/DNR/DNI               DVT prophylaxis: Subcu heparin Code Status: DNR/DNI Family Communication: Updated patient's son at bedside on 4/28.  None at bedside today.  Status is: Inpatient  Remains inpatient appropriate because:Hemodynamically unstable   Dispo: The patient is from: Home              Anticipated d/c is to: SNF              Anticipated d/c date is: > 3 days              Patient currently is not medically stable to d/c.due to significant orthostatic hypotension     Consultants:  Cardiology   Sch Meds:  Scheduled Meds: . calcium-vitamin D  1 tablet Oral QODAY  . cefdinir  300 mg Oral Q12H  . Chlorhexidine Gluconate Cloth  6 each Topical Daily  . heparin  5,000 Units Subcutaneous Q8H  . letrozole  2.5 mg Oral Daily  . pantoprazole  40 mg Oral Daily   Continuous Infusions: . lactated ringers     PRN Meds:.acetaminophen, hydrALAZINE, senna-docusate  Antimicrobials: Anti-infectives (From admission, onward)   Start     Dose/Rate Route Frequency Ordered Stop   07/05/19 2200  cefdinir (OMNICEF) capsule 300 mg     300 mg Oral Every 12 hours 07/05/19 1241     07/04/19 0900  ceFAZolin (ANCEF) IVPB 2g/100 mL premix  Status:  Discontinued     2 g 200 mL/hr over 30 Minutes Intravenous Every 8 hours 07/04/19 0847 07/05/19 1241   07/02/19 1000  cefTRIAXone (ROCEPHIN) 1 g in sodium chloride 0.9 % 100 mL IVPB  Status:  Discontinued     1 g 200 mL/hr over 30 Minutes Intravenous Every 24 hours 07/01/19 1538 07/01/19 1541   07/02/19 0800  cefTRIAXone (ROCEPHIN) 2 g in sodium chloride 0.9 % 100 mL IVPB  Status:  Discontinued     2 g 200 mL/hr over 30 Minutes Intravenous Every 24 hours  07/02/19 0643 07/04/19 0847   07/01/19 1430  aztreonam (AZACTAM) 1 g in sodium chloride 0.9 % 100 mL IVPB  Status:  Discontinued     1 g 200 mL/hr over 30 Minutes Intravenous  Once 07/01/19 1417 07/01/19 1419   07/01/19 1430  cefTRIAXone (ROCEPHIN) 1 g in sodium chloride 0.9 % 100 mL IVPB  Status:  Discontinued     1 g 200 mL/hr over 30 Minutes Intravenous Every 24 hours 07/01/19 1425 07/02/19 0643       I have personally reviewed the following labs and images: CBC: Recent Labs  Lab 06/30/19 1110 06/30/19 1110 07/01/19 1254 07/02/19 0538 07/03/19 0235 07/04/19 0137 07/05/19 0328  WBC 9.6   < > 9.2 7.9 6.1 5.0 5.5  NEUTROABS 7.6  --  7.4  --   --   --   --   HGB 10.1*   < > 10.4* 9.5* 10.5* 10.6* 10.2*  HCT 32.2*   < > 34.6* 30.8* 34.3* 33.9* 31.9*  MCV 87.0   < >  88.9 86.5 88.4 87.6 86.7  PLT 211   < > 180 183 228 269 277   < > = values in this interval not displayed.   BMP &GFR Recent Labs  Lab 07/01/19 1254 07/02/19 0538 07/03/19 0235 07/04/19 0137 07/05/19 0328  NA 137 137 136 142 143  K 3.7 3.6 3.4* 3.8 3.0*  CL 102 105 103 107 108  CO2 24 23 22  16* 24  GLUCOSE 142* 106* 111* 106* 102*  BUN 16 15 16 15 13   CREATININE 1.45* 1.05* 1.20* 1.18* 1.03*  CALCIUM 8.8* 8.4* 8.7* 8.9 8.9  MG  --   --   --   --  1.8  PHOS  --   --   --   --  3.4   Estimated Creatinine Clearance: 37.6 mL/min (A) (by C-G formula based on SCr of 1.03 mg/dL (H)). Liver & Pancreas: Recent Labs  Lab 06/30/19 1110 07/01/19 1254 07/04/19 0137 07/05/19 0328  AST 16 36 25  --   ALT 10 15 18   --   ALKPHOS 48 50 40  --   BILITOT 1.3* 1.1 0.6  --   PROT 6.7 6.3* 5.8*  --   ALBUMIN 3.3* 2.8* 2.6* 2.6*   No results for input(s): LIPASE, AMYLASE in the last 168 hours. Recent Labs  Lab 07/01/19 1254  AMMONIA 11   Diabetic: No results for input(s): HGBA1C in the last 72 hours. Recent Labs  Lab 07/01/19 1255  GLUCAP 122*   Cardiac Enzymes: No results for input(s): CKTOTAL, CKMB,  CKMBINDEX, TROPONINI in the last 168 hours. No results for input(s): PROBNP in the last 8760 hours. Coagulation Profile: Recent Labs  Lab 07/01/19 1254  INR 1.1   Thyroid Function Tests: Recent Labs    07/05/19 0328  TSH 1.018   Lipid Profile: No results for input(s): CHOL, HDL, LDLCALC, TRIG, CHOLHDL, LDLDIRECT in the last 72 hours. Anemia Panel: No results for input(s): VITAMINB12, FOLATE, FERRITIN, TIBC, IRON, RETICCTPCT in the last 72 hours. Urine analysis:    Component Value Date/Time   COLORURINE AMBER (A) 07/01/2019 1254   APPEARANCEUR CLOUDY (A) 07/01/2019 1254   APPEARANCEUR Cloudy (A) 04/25/2015 1449   LABSPEC 1.011 07/01/2019 1254   LABSPEC 1.021 01/17/2014 1524   PHURINE 6.0 07/01/2019 1254   GLUCOSEU NEGATIVE 07/01/2019 1254   GLUCOSEU Negative 01/17/2014 1524   HGBUR SMALL (A) 07/01/2019 1254   BILIRUBINUR NEGATIVE 07/01/2019 1254   BILIRUBINUR neg 06/30/2016 1619   BILIRUBINUR Negative 04/25/2015 1449   BILIRUBINUR Negative 01/17/2014 1524   KETONESUR NEGATIVE 07/01/2019 1254   PROTEINUR 100 (A) 07/01/2019 1254   UROBILINOGEN 0.2 06/30/2016 1619   NITRITE NEGATIVE 07/01/2019 1254   LEUKOCYTESUR LARGE (A) 07/01/2019 1254   LEUKOCYTESUR 2+ 01/17/2014 1524   Sepsis Labs: Invalid input(s): PROCALCITONIN, [place]  Microbiology: Recent Results (from the past 240 hour(s))  Respiratory Panel by RT PCR (Flu A&B, Covid) - Nasopharyngeal Swab     Status: None   Collection Time: 06/27/19  8:06 PM   Specimen: Nasopharyngeal Swab  Result Value Ref Range Status   SARS Coronavirus 2 by RT PCR NEGATIVE NEGATIVE Final    Comment: (NOTE) SARS-CoV-2 target nucleic acids are NOT DETECTED. The SARS-CoV-2 RNA is generally detectable in upper respiratoy specimens during the acute phase of infection. The lowest concentration of SARS-CoV-2 viral copies this assay can detect is 131 copies/mL. A negative result does not preclude SARS-Cov-2 infection and should not be  used as the sole basis for treatment  or other patient management decisions. A negative result may occur with  improper specimen collection/handling, submission of specimen other than nasopharyngeal swab, presence of viral mutation(s) within the areas targeted by this assay, and inadequate number of viral copies (<131 copies/mL). A negative result must be combined with clinical observations, patient history, and epidemiological information. The expected result is Negative. Fact Sheet for Patients:  PinkCheek.be Fact Sheet for Healthcare Providers:  GravelBags.it This test is not yet ap proved or cleared by the [place] FDA and  has been authorized for detection and/or diagnosis of SARS-CoV-2 by FDA under an Emergency Use Authorization (EUA). This EUA will remain  in effect (meaning this test can be used) for the duration of the COVID-19 declaration under Section 564(b)(1) of the Act, 21 U.S.C. section 360bbb-3(b)(1), unless the authorization is terminated or revoked sooner.    Influenza A by PCR NEGATIVE NEGATIVE Final   Influenza B by PCR NEGATIVE NEGATIVE Final    Comment: (NOTE) The Xpert Xpress SARS-CoV-2/FLU/RSV assay is intended as an aid in  the diagnosis of influenza from Nasopharyngeal swab specimens and  should not be used as a sole basis for treatment. Nasal washings and  aspirates are unacceptable for Xpert Xpress SARS-CoV-2/FLU/RSV  testing. Fact Sheet for Patients: PinkCheek.be Fact Sheet for Healthcare Providers: GravelBags.it This test is not yet approved or cleared by the [place] FDA and  has been authorized for detection and/or diagnosis of SARS-CoV-2 by  FDA under an Emergency Use Authorization (EUA). This EUA will remain  in effect (meaning this test can be used) for the duration of the  Covid-19 declaration under Section 564(b)(1) of the  Act, 21  U.S.C. section 360bbb-3(b)(1), unless the authorization is  terminated or revoked. Performed at [facility], [address] [place], [place] [address]   Urine culture     Status: Abnormal   Collection Time: 06/30/19  1:45 PM   Specimen: Urine, Random  Result Value Ref Range Status   Specimen Description   Final    URINE, RANDOM Performed at [facility], [address] [place], [place] [address]    Special Requests   Final    NONE Performed at [facility], [address] [place], [place] [address]    Culture (A)  Final    >=100,000 COLONIES/mL ESCHERICHIA COLI 80,000 COLONIES/mL KLEBSIELLA PNEUMONIAE    Report Status 07/02/2019 FINAL  Final   Organism ID, Bacteria ESCHERICHIA COLI (A)  Final   Organism ID, Bacteria KLEBSIELLA PNEUMONIAE (A)  Final      Susceptibility   Escherichia coli - MIC*    AMPICILLIN >=32 RESISTANT Resistant     CEFAZOLIN <=4 SENSITIVE Sensitive     CEFTRIAXONE <=1 SENSITIVE Sensitive     CIPROFLOXACIN >=4 RESISTANT Resistant     GENTAMICIN <=1 SENSITIVE Sensitive     IMIPENEM <=0.25 SENSITIVE Sensitive     NITROFURANTOIN <=16 SENSITIVE Sensitive     TRIMETH/SULFA >=320 RESISTANT Resistant     AMPICILLIN/SULBACTAM 8 SENSITIVE Sensitive     PIP/TAZO <=4 SENSITIVE Sensitive     * >=100,000 COLONIES/mL ESCHERICHIA COLI   Klebsiella pneumoniae - MIC*    AMPICILLIN RESISTANT Resistant     CEFAZOLIN <=4 SENSITIVE Sensitive     CEFTRIAXONE <=1 SENSITIVE Sensitive     CIPROFLOXACIN <=0.25 SENSITIVE Sensitive     GENTAMICIN <=1 SENSITIVE Sensitive     IMIPENEM <=0.25 SENSITIVE Sensitive     NITROFURANTOIN 32 SENSITIVE Sensitive     TRIMETH/SULFA <=20  SENSITIVE Sensitive     AMPICILLIN/SULBACTAM 4 SENSITIVE Sensitive     PIP/TAZO <=4 SENSITIVE Sensitive     * 80,000 COLONIES/mL KLEBSIELLA PNEUMONIAE  Urine culture     Status: Abnormal   Collection Time: 07/01/19 12:54 PM   Specimen: Urine, Random  Result  Value Ref Range Status   Specimen Description URINE, RANDOM  Final   Special Requests SITE NOT SPECIFIED  Final   Culture (A)  Final    >=100,000 COLONIES/mL ESCHERICHIA COLI >=100,000 COLONIES/mL KLEBSIELLA PNEUMONIAE    Report Status 07/03/2019 FINAL  Final   Organism ID, Bacteria ESCHERICHIA COLI (A)  Final   Organism ID, Bacteria KLEBSIELLA PNEUMONIAE (A)  Final      Susceptibility   Escherichia coli - MIC*    AMPICILLIN >=32 RESISTANT Resistant     CEFAZOLIN <=4 SENSITIVE Sensitive     CEFTRIAXONE <=1 SENSITIVE Sensitive     CIPROFLOXACIN >=4 RESISTANT Resistant     GENTAMICIN <=1 SENSITIVE Sensitive     IMIPENEM <=0.25 SENSITIVE Sensitive     NITROFURANTOIN <=16 SENSITIVE Sensitive     TRIMETH/SULFA >=320 RESISTANT Resistant     AMPICILLIN/SULBACTAM 8 SENSITIVE Sensitive     PIP/TAZO <=4 SENSITIVE Sensitive     * >=100,000 COLONIES/mL ESCHERICHIA COLI   Klebsiella pneumoniae - MIC*    AMPICILLIN RESISTANT Resistant     CEFAZOLIN <=4 SENSITIVE Sensitive     CEFTRIAXONE <=1 SENSITIVE Sensitive     CIPROFLOXACIN <=0.25 SENSITIVE Sensitive     GENTAMICIN <=1 SENSITIVE Sensitive     IMIPENEM <=0.25 SENSITIVE Sensitive     NITROFURANTOIN <=16 SENSITIVE Sensitive     TRIMETH/SULFA <=20 SENSITIVE Sensitive     AMPICILLIN/SULBACTAM 4 SENSITIVE Sensitive     PIP/TAZO <=4 SENSITIVE Sensitive     * >=100,000 COLONIES/mL KLEBSIELLA PNEUMONIAE  Culture, blood (routine x 2)     Status: Abnormal   Collection Time: 07/01/19  2:31 PM   Specimen: BLOOD  Result Value Ref Range Status   Specimen Description BLOOD LEFT ANTECUBITAL  Final   Special Requests   Final    BOTTLES DRAWN AEROBIC AND ANAEROBIC Blood Culture results may not be optimal due to an inadequate volume of blood received in culture bottles   Culture  Setup Time   Final    GRAM NEGATIVE RODS ANAEROBIC BOTTLE ONLY CRITICAL VALUE NOTED.  VALUE IS CONSISTENT WITH PREVIOUSLY REPORTED AND CALLED VALUE.    Culture (A)  Final     ESCHERICHIA COLI SUSCEPTIBILITIES PERFORMED ON PREVIOUS CULTURE WITHIN THE LAST 5 DAYS. Performed at [facility], [place] [address], [place], [place] [address]    Report Status 07/05/2019 FINAL  Final  Culture, blood (routine x 2)     Status: Abnormal   Collection Time: 07/01/19  2:31 PM   Specimen: BLOOD LEFT ARM  Result Value Ref Range Status   Specimen Description BLOOD LEFT ARM  Final   Special Requests   Final    BOTTLES DRAWN AEROBIC AND ANAEROBIC Blood Culture adequate volume   Culture  Setup Time   Final    GRAM NEGATIVE RODS ANAEROBIC BOTTLE ONLY CRITICAL RESULT CALLED TO, READ BACK BY AND VERIFIED WITH: PHARMD G ABBOTT JQ:[id number] AT 88 AM BY CM Performed at [facility], [place] [address], [place], [place] [address]    Culture ESCHERICHIA COLI (A)  Final   Report Status 07/05/2019 FINAL  Final   Organism ID, Bacteria ESCHERICHIA COLI  Final  Susceptibility   Escherichia coli - MIC*    AMPICILLIN >=32 RESISTANT Resistant     CEFAZOLIN <=4 SENSITIVE Sensitive     CEFEPIME <=1 SENSITIVE Sensitive     CEFTAZIDIME <=1 SENSITIVE Sensitive     CEFTRIAXONE <=1 SENSITIVE Sensitive     CIPROFLOXACIN >=4 RESISTANT Resistant     GENTAMICIN <=1 SENSITIVE Sensitive     IMIPENEM <=0.25 SENSITIVE Sensitive     TRIMETH/SULFA >=320 RESISTANT Resistant     AMPICILLIN/SULBACTAM 8 SENSITIVE Sensitive     PIP/TAZO <=4 SENSITIVE Sensitive     * ESCHERICHIA COLI  Blood Culture ID Panel (Reflexed)     Status: Abnormal   Collection Time: 07/01/19  2:31 PM  Result Value Ref Range Status   Enterococcus species NOT DETECTED NOT DETECTED Final   Listeria monocytogenes NOT DETECTED NOT DETECTED Final   Staphylococcus species NOT DETECTED NOT DETECTED Final   Staphylococcus aureus (BCID) NOT DETECTED NOT DETECTED Final   Streptococcus species NOT DETECTED NOT DETECTED Final   Streptococcus agalactiae NOT DETECTED NOT DETECTED Final   Streptococcus pneumoniae NOT DETECTED NOT  DETECTED Final   Streptococcus pyogenes NOT DETECTED NOT DETECTED Final   Acinetobacter baumannii NOT DETECTED NOT DETECTED Final   Enterobacteriaceae species DETECTED (A) NOT DETECTED Final    Comment: Enterobacteriaceae represent a large family of gram-negative bacteria, not a single organism. CRITICAL RESULT CALLED TO, READ BACK BY AND VERIFIED WITH: PHARMD G ABBOTT JQ:[id number] AT 36 AM Y CM    Enterobacter cloacae complex NOT DETECTED NOT DETECTED Final   Escherichia coli DETECTED (A) NOT DETECTED Final    Comment: CRITICAL RESULT CALLED TO, READ BACK BY AND VERIFIED WITH: PHARMD G ABBOTT JQ:[id number] AT [id number] AM BY CM    Klebsiella oxytoca NOT DETECTED NOT DETECTED Final   Klebsiella pneumoniae NOT DETECTED NOT DETECTED Final   Proteus species NOT DETECTED NOT DETECTED Final   Serratia marcescens NOT DETECTED NOT DETECTED Final   Carbapenem resistance NOT DETECTED NOT DETECTED Final   Haemophilus influenzae NOT DETECTED NOT DETECTED Final   Neisseria meningitidis NOT DETECTED NOT DETECTED Final   Pseudomonas aeruginosa NOT DETECTED NOT DETECTED Final   Candida albicans NOT DETECTED NOT DETECTED Final   Candida glabrata NOT DETECTED NOT DETECTED Final   Candida krusei NOT DETECTED NOT DETECTED Final   Candida parapsilosis NOT DETECTED NOT DETECTED Final   Candida tropicalis NOT DETECTED NOT DETECTED Final    Comment: Performed at [facility], [place] [address] [place], [place] [address]  SARS CORONAVIRUS 2 (TAT 6-24 HRS) Nasopharyngeal Nasopharyngeal Swab     Status: None   Collection Time: 07/01/19  4:44 PM   Specimen: Nasopharyngeal Swab  Result Value Ref Range Status   SARS Coronavirus 2 NEGATIVE NEGATIVE Final    Comment: (NOTE) SARS-CoV-2 target nucleic acids are NOT DETECTED. The SARS-CoV-2 RNA is generally detectable in upper and lower respiratory specimens during the acute phase of infection. Negative results do not preclude SARS-CoV-2 infection, do not rule  out co-infections with other pathogens, and should not be used as the sole basis for treatment or other patient management decisions. Negative results must be combined with clinical observations, patient history, and epidemiological information. The expected result is Negative. Fact Sheet for Patients: SugarRoll.be Fact Sheet for Healthcare Providers: https://www.woods-mathews.com/ This test is not yet approved or cleared by the [place] FDA and  has been authorized for detection and/or diagnosis of SARS-CoV-2 by FDA under an Emergency Use Authorization (EUA). This EUA  will remain  in effect (meaning this test can be used) for the duration of the COVID-19 declaration under Section 56 4(b)(1) of the Act, 21 U.S.C. section 360bbb-3(b)(1), unless the authorization is terminated or revoked sooner. Performed at [facility], [place] [address] [place], [place] [address]     Radiology Studies: No results found.    Thelmer Legler T. [place]  If 7PM-7AM, please contact night-coverage [email or web address] Password [email or web address] 07/05/2019, 1:33 PM

## 2019-07-06 ENCOUNTER — Inpatient Hospital Stay (HOSPITAL_COMMUNITY): Payer: PPO

## 2019-07-06 DIAGNOSIS — I350 Nonrheumatic aortic (valve) stenosis: Secondary | ICD-10-CM

## 2019-07-06 DIAGNOSIS — I361 Nonrheumatic tricuspid (valve) insufficiency: Secondary | ICD-10-CM | POA: Diagnosis not present

## 2019-07-06 DIAGNOSIS — R55 Syncope and collapse: Secondary | ICD-10-CM

## 2019-07-06 LAB — CBC
HCT: 32.4 % — ABNORMAL LOW (ref 36.0–46.0)
Hemoglobin: 10 g/dL — ABNORMAL LOW (ref 12.0–15.0)
MCH: 27.2 pg (ref 26.0–34.0)
MCHC: 30.9 g/dL (ref 30.0–36.0)
MCV: 88.3 fL (ref 80.0–100.0)
Platelets: 295 10*3/uL (ref 150–400)
RBC: 3.67 MIL/uL — ABNORMAL LOW (ref 3.87–5.11)
RDW: 19.4 % — ABNORMAL HIGH (ref 11.5–15.5)
WBC: 7.7 10*3/uL (ref 4.0–10.5)
nRBC: 0 % (ref 0.0–0.2)

## 2019-07-06 LAB — RENAL FUNCTION PANEL
Albumin: 2.8 g/dL — ABNORMAL LOW (ref 3.5–5.0)
Anion gap: 10 (ref 5–15)
BUN: 15 mg/dL (ref 8–23)
CO2: 23 mmol/L (ref 22–32)
Calcium: 9 mg/dL (ref 8.9–10.3)
Chloride: 109 mmol/L (ref 98–111)
Creatinine, Ser: 1.24 mg/dL — ABNORMAL HIGH (ref 0.44–1.00)
GFR calc Af Amer: 48 mL/min — ABNORMAL LOW (ref >60)
GFR calc non Af Amer: 41 mL/min — ABNORMAL LOW (ref >60)
Glucose, Bld: 97 mg/dL (ref 70–99)
Phosphorus: 2.8 mg/dL (ref 2.5–4.6)
Potassium: 3.8 mmol/L (ref 3.5–5.1)
Sodium: 142 mmol/L (ref 135–145)

## 2019-07-06 LAB — ECHOCARDIOGRAM COMPLETE
Height: 64 in
Weight: 2160 oz

## 2019-07-06 LAB — MAGNESIUM: Magnesium: 1.9 mg/dL (ref 1.7–2.4)

## 2019-07-06 NOTE — Progress Notes (Signed)
PROGRESS NOTE  [name] [name] [id number] DOB: 05/03/1940   PCP: Jerrol Banana., MD  Patient is from: home.  Uses walker at baseline.  Lives with son and husband.  DOA: 07/01/2019 LOS: 5  Brief Narrative / Interim history: 79 y.o.femalewith history of breast cancer, CAD, HTN and dementia presenting with recurrent syncopal episode and admitted for syncope and sepsis due to E. coli bacteremia, and E. coli and Klebsiella UTI.  She was treated with ceftriaxone and transitioned to Ancef based on sensitivity.  Patient was previously hospitalized with AKI in the setting of ureteral obstruction requiring Foley catheter about a month ago.  At that time, Foley was discontinued on discharge.  However, she was found to have acute urinary tension when she presented to the urology office for follow-up prior to this admission.  This admission, patient was found to have E. coli and Klebsiella UTI as well as E. coli bacteremia.   Cardiology consulted on this admission and recommended outpatient follow-up and signed off.  Patient with significant orthostasis.  Echo and CUS pending.  Therapy recommended SNF.  Subjective: Seen and examined earlier this morning.  No major events overnight or this morning. No complaints but not a great historian although she is oriented x4 except date today.  Does not seem to have great insight.  Objective: Vitals:   07/05/19 1704 07/05/19 1958 07/06/19 0007 07/06/19 1000  BP: (!) 179/87 (!) 108/58 (!) 173/90 (!) 181/91  Pulse: 87 79 93 76  Resp: 17 18 18 18   Temp: 97.6 F (36.4 C) (!) 97.5 F (36.4 C) 98.2 F (36.8 C) 97.7 F (36.5 C)  TempSrc:   Oral Oral  SpO2: 99% 100% 96% 100%  Weight:      Height:       No intake or output data in the 24 hours ending 07/06/19 1119 Filed Weights   07/01/19 1421  Weight: 61.2 kg    Examination:  GENERAL: Frail but no apparent distress.  Nontoxic. HEENT: MMM.  Vision and hearing grossly intact.  NECK:  Supple.  No apparent JVD.  RESP:  No IWOB.  Fair aeration bilaterally. CVS:  RRR. Heart sounds normal.  ABD/GI/GU: Bowel sounds present. Soft. Non tender.  Indwelling Foley. MSK/EXT:  Moves extremities.  Global muscle mass and subcu fat loss. SKIN: no apparent skin lesion or wound NEURO: Awake, alert and oriented x4 except to date.  No apparent focal neuro deficit. PSYCH: Calm. Normal affect.  Procedures:  None  Microbiology summarized: 4/25-COVID-19 PCR negative.   4/25-blood culture with E. Coli. 4/25-urine culture with E. coli and Klebsiella-both sensitive to cefazolin.   Assessment & Plan: Severe sepsis due to catheter associated ecoli UTI and bacteremia, and Klebsiella UTI: POA.  evidence of endorgan damage including confusion, syncope, hypotension and lactic acidosis on admission. -Sepsis physiology resolved. -Completed 7 days of antibiotic course with ceftriaxone and Omnicef on 4/30.  Recurrent syncope/orthostatic hypotension-continues to have significant orthostasis.  Could have some autonomic dysregulation.  TSH and cortisol within normal. -Orthostatic vitals every shift. -Cardiology recommending outpatient follow-up. -Apply TED hose-unfortunately not helping due to significant muscle mass/subcu fat loss. -Follow echocardiogram and CUS   Hypertension: On lisinopril at home.  -Hold home lisinopril. -As needed hydralazine for standing SBP above 160 or standing DBP above 100 -Requested RN to chart standing blood pressure.  Urethra obstruction-with indwelling Foley catheter inserted by urology. -Likely discharge home with indwelling Foley catheter for outpatient urology follow-up.  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 -Continue holding home lisinopril -Avoid nephrotoxic meds -Continue monitoring  Hypokalemia: Magnesium was normal. -Replenish and recheck.  Shortened PR-Interval -Outpatient follow-up with cardiology.  History of  Breast cancer -continue home Femara  Cognitive impairment/dementia without behavioral disturbance: She is oriented x4 except to date today but does not seem to have great insight. -Frequent orientation and delirium precautions  Debility/physical deconditioning-uses walker at baseline. -PT/OT-recommended SNF.  Goal of care/DNR/DNI               DVT prophylaxis: Subcu heparin Code Status: DNR/DNI Family Communication: Updated patient's son at bedside on 4/28.  None at bedside today.  Status is: Inpatient  Remains inpatient appropriate because:Hemodynamically unstable with significant orthostasis   Dispo: The patient is from: Home              Anticipated d/c is to: SNF              Anticipated d/c date is: > 3 days              Patient currently is not medically stable to d/c.due to significant orthostatic hypotension     Consultants:  Cardiology   Sch Meds:  Scheduled Meds: . calcium-vitamin D  1 tablet Oral QODAY  . Chlorhexidine Gluconate Cloth  6 each Topical Daily  . heparin  5,000 Units Subcutaneous Q8H  . letrozole  2.5 mg Oral Daily  . pantoprazole  40 mg Oral Daily   Continuous Infusions: . lactated ringers     PRN Meds:.acetaminophen, hydrALAZINE, senna-docusate  Antimicrobials: Anti-infectives (From admission, onward)   Start     Dose/Rate Route Frequency Ordered Stop   07/05/19 2200  cefdinir (OMNICEF) capsule 300 mg  Status:  Discontinued     300 mg Oral Every 12 hours 07/05/19 1241 07/06/19 1006   07/04/19 0900  ceFAZolin (ANCEF) IVPB 2g/100 mL premix  Status:  Discontinued     2 g 200 mL/hr over 30 Minutes Intravenous Every 8 hours 07/04/19 0847 07/05/19 1241   07/02/19 1000  cefTRIAXone (ROCEPHIN) 1 g in sodium chloride 0.9 % 100 mL IVPB  Status:  Discontinued     1 g 200 mL/hr over 30 Minutes Intravenous Every 24 hours 07/01/19 1538 07/01/19 1541   07/02/19 0800  cefTRIAXone (ROCEPHIN) 2 g in sodium chloride 0.9 % 100 mL IVPB  Status:   Discontinued     2 g 200 mL/hr over 30 Minutes Intravenous Every 24 hours 07/02/19 0643 07/04/19 0847   07/01/19 1430  aztreonam (AZACTAM) 1 g in sodium chloride 0.9 % 100 mL IVPB  Status:  Discontinued     1 g 200 mL/hr over 30 Minutes Intravenous  Once 07/01/19 1417 07/01/19 1419   07/01/19 1430  cefTRIAXone (ROCEPHIN) 1 g in sodium chloride 0.9 % 100 mL IVPB  Status:  Discontinued     1 g 200 mL/hr over 30 Minutes Intravenous Every 24 hours 07/01/19 1425 07/02/19 0643       I have personally reviewed the following labs and images: CBC: Recent Labs  Lab 06/30/19 1110 06/30/19 1110 07/01/19 1254 07/01/19 1254 07/02/19 0538 07/03/19 0235 07/04/19 0137 07/05/19 0328 07/06/19 0259  WBC 9.6   < > 9.2   < > 7.9 6.1 5.0 5.5 7.7  NEUTROABS 7.6  --  7.4  --   --   --   --   --   --   HGB 10.1*   < > 10.4*   < > 9.5* 10.5* 10.6* 10.2*  10.0*  HCT 32.2*   < > 34.6*   < > 30.8* 34.3* 33.9* 31.9* 32.4*  MCV 87.0   < > 88.9   < > 86.5 88.4 87.6 86.7 88.3  PLT 211   < > 180   < > 183 228 269 277 295   < > = values in this interval not displayed.   BMP &GFR Recent Labs  Lab 07/02/19 0538 07/03/19 0235 07/04/19 0137 07/05/19 0328 07/06/19 0259  NA 137 136 142 143 142  K 3.6 3.4* 3.8 3.0* 3.8  CL 105 103 107 108 109  CO2 23 22 16* 24 23  GLUCOSE 106* 111* 106* 102* 97  BUN 15 16 15 13 15   CREATININE 1.05* 1.20* 1.18* 1.03* 1.24*  CALCIUM 8.4* 8.7* 8.9 8.9 9.0  MG  --   --   --  1.8 1.9  PHOS  --   --   --  3.4 2.8   Estimated Creatinine Clearance: 31.2 mL/min (A) (by C-G formula based on SCr of 1.24 mg/dL (H)). Liver & Pancreas: Recent Labs  Lab 06/30/19 1110 07/01/19 1254 07/04/19 0137 07/05/19 0328 07/06/19 0259  AST 16 36 25  --   --   ALT 10 15 18   --   --   ALKPHOS 48 50 40  --   --   BILITOT 1.3* 1.1 0.6  --   --   PROT 6.7 6.3* 5.8*  --   --   ALBUMIN 3.3* 2.8* 2.6* 2.6* 2.8*   No results for input(s): LIPASE, AMYLASE in the last 168 hours. Recent Labs    Lab 07/01/19 1254  AMMONIA 11   Diabetic: No results for input(s): HGBA1C in the last 72 hours. Recent Labs  Lab 07/01/19 1255 07/05/19 2005  GLUCAP 122* 105*   Cardiac Enzymes: No results for input(s): CKTOTAL, CKMB, CKMBINDEX, TROPONINI in the last 168 hours. No results for input(s): PROBNP in the last 8760 hours. Coagulation Profile: Recent Labs  Lab 07/01/19 1254  INR 1.1   Thyroid Function Tests: Recent Labs    07/05/19 0328  TSH 1.018   Lipid Profile: No results for input(s): CHOL, HDL, LDLCALC, TRIG, CHOLHDL, LDLDIRECT in the last 72 hours. Anemia Panel: No results for input(s): VITAMINB12, FOLATE, FERRITIN, TIBC, IRON, RETICCTPCT in the last 72 hours. Urine analysis:    Component Value Date/Time   COLORURINE AMBER (A) 07/01/2019 1254   APPEARANCEUR CLOUDY (A) 07/01/2019 1254   APPEARANCEUR Cloudy (A) 04/25/2015 1449   LABSPEC 1.011 07/01/2019 1254   LABSPEC 1.021 01/17/2014 1524   PHURINE 6.0 07/01/2019 1254   GLUCOSEU NEGATIVE 07/01/2019 1254   GLUCOSEU Negative 01/17/2014 1524   HGBUR SMALL (A) 07/01/2019 1254   BILIRUBINUR NEGATIVE 07/01/2019 1254   BILIRUBINUR neg 06/30/2016 1619   BILIRUBINUR Negative 04/25/2015 1449   BILIRUBINUR Negative 01/17/2014 1524   KETONESUR NEGATIVE 07/01/2019 1254   PROTEINUR 100 (A) 07/01/2019 1254   UROBILINOGEN 0.2 06/30/2016 1619   NITRITE NEGATIVE 07/01/2019 1254   LEUKOCYTESUR LARGE (A) 07/01/2019 1254   LEUKOCYTESUR 2+ 01/17/2014 1524   Sepsis Labs: Invalid input(s): PROCALCITONIN, [place]  Microbiology: Recent Results (from the past 240 hour(s))  Respiratory Panel by RT PCR (Flu A&B, Covid) - Nasopharyngeal Swab     Status: None   Collection Time: 06/27/19  8:06 PM   Specimen: Nasopharyngeal Swab  Result Value Ref Range Status   SARS Coronavirus 2 by RT PCR NEGATIVE NEGATIVE Final    Comment: (NOTE) SARS-CoV-2 target nucleic acids  are NOT DETECTED. The SARS-CoV-2 RNA is generally detectable in  upper respiratoy specimens during the acute phase of infection. The lowest concentration of SARS-CoV-2 viral copies this assay can detect is 131 copies/mL. A negative result does not preclude SARS-Cov-2 infection and should not be used as the sole basis for treatment or other patient management decisions. A negative result may occur with  improper specimen collection/handling, submission of specimen other than nasopharyngeal swab, presence of viral mutation(s) within the areas targeted by this assay, and inadequate number of viral copies (<131 copies/mL). A negative result must be combined with clinical observations, patient history, and epidemiological information. The expected result is Negative. Fact Sheet for Patients:  PinkCheek.be Fact Sheet for Healthcare Providers:  GravelBags.it This test is not yet ap proved or cleared by the [place] FDA and  has been authorized for detection and/or diagnosis of SARS-CoV-2 by FDA under an Emergency Use Authorization (EUA). This EUA will remain  in effect (meaning this test can be used) for the duration of the COVID-19 declaration under Section 564(b)(1) of the Act, 21 U.S.C. section 360bbb-3(b)(1), unless the authorization is terminated or revoked sooner.    Influenza A by PCR NEGATIVE NEGATIVE Final   Influenza B by PCR NEGATIVE NEGATIVE Final    Comment: (NOTE) The Xpert Xpress SARS-CoV-2/FLU/RSV assay is intended as an aid in  the diagnosis of influenza from Nasopharyngeal swab specimens and  should not be used as a sole basis for treatment. Nasal washings and  aspirates are unacceptable for Xpert Xpress SARS-CoV-2/FLU/RSV  testing. Fact Sheet for Patients: PinkCheek.be Fact Sheet for Healthcare Providers: GravelBags.it This test is not yet approved or cleared by the [place] FDA and  has been authorized for  detection and/or diagnosis of SARS-CoV-2 by  FDA under an Emergency Use Authorization (EUA). This EUA will remain  in effect (meaning this test can be used) for the duration of the  Covid-19 declaration under Section 564(b)(1) of the Act, 21  U.S.C. section 360bbb-3(b)(1), unless the authorization is  terminated or revoked. Performed at [facility], [address] [place], [place] [address]   Urine culture     Status: Abnormal   Collection Time: 06/30/19  1:45 PM   Specimen: Urine, Random  Result Value Ref Range Status   Specimen Description   Final    URINE, RANDOM Performed at [facility], [address] [place], [place] [address]    Special Requests   Final    NONE Performed at [facility], [place], [place] [address]    Culture (A)  Final    >=100,000 COLONIES/mL ESCHERICHIA COLI 80,000 COLONIES/mL KLEBSIELLA PNEUMONIAE    Report Status 07/02/2019 FINAL  Final   Organism ID, Bacteria ESCHERICHIA COLI (A)  Final   Organism ID, Bacteria KLEBSIELLA PNEUMONIAE (A)  Final      Susceptibility   Escherichia coli - MIC*    AMPICILLIN >=32 RESISTANT Resistant     CEFAZOLIN <=4 SENSITIVE Sensitive     CEFTRIAXONE <=1 SENSITIVE Sensitive     CIPROFLOXACIN >=4 RESISTANT Resistant     GENTAMICIN <=1 SENSITIVE Sensitive     IMIPENEM <=0.25 SENSITIVE Sensitive     NITROFURANTOIN <=16 SENSITIVE Sensitive     TRIMETH/SULFA >=320 RESISTANT Resistant     AMPICILLIN/SULBACTAM 8 SENSITIVE Sensitive     PIP/TAZO <=4 SENSITIVE Sensitive     * >=100,000 COLONIES/mL ESCHERICHIA COLI   Klebsiella pneumoniae - MIC*    AMPICILLIN RESISTANT Resistant  CEFAZOLIN <=4 SENSITIVE Sensitive     CEFTRIAXONE <=1 SENSITIVE Sensitive     CIPROFLOXACIN <=0.25 SENSITIVE Sensitive     GENTAMICIN <=1 SENSITIVE Sensitive     IMIPENEM <=0.25 SENSITIVE Sensitive     NITROFURANTOIN 32 SENSITIVE Sensitive     TRIMETH/SULFA <=20 SENSITIVE Sensitive      AMPICILLIN/SULBACTAM 4 SENSITIVE Sensitive     PIP/TAZO <=4 SENSITIVE Sensitive     * 80,000 COLONIES/mL KLEBSIELLA PNEUMONIAE  Urine culture     Status: Abnormal   Collection Time: 07/01/19 12:54 PM   Specimen: Urine, Random  Result Value Ref Range Status   Specimen Description URINE, RANDOM  Final   Special Requests SITE NOT SPECIFIED  Final   Culture (A)  Final    >=100,000 COLONIES/mL ESCHERICHIA COLI >=100,000 COLONIES/mL KLEBSIELLA PNEUMONIAE    Report Status 07/03/2019 FINAL  Final   Organism ID, Bacteria ESCHERICHIA COLI (A)  Final   Organism ID, Bacteria KLEBSIELLA PNEUMONIAE (A)  Final      Susceptibility   Escherichia coli - MIC*    AMPICILLIN >=32 RESISTANT Resistant     CEFAZOLIN <=4 SENSITIVE Sensitive     CEFTRIAXONE <=1 SENSITIVE Sensitive     CIPROFLOXACIN >=4 RESISTANT Resistant     GENTAMICIN <=1 SENSITIVE Sensitive     IMIPENEM <=0.25 SENSITIVE Sensitive     NITROFURANTOIN <=16 SENSITIVE Sensitive     TRIMETH/SULFA >=320 RESISTANT Resistant     AMPICILLIN/SULBACTAM 8 SENSITIVE Sensitive     PIP/TAZO <=4 SENSITIVE Sensitive     * >=100,000 COLONIES/mL ESCHERICHIA COLI   Klebsiella pneumoniae - MIC*    AMPICILLIN RESISTANT Resistant     CEFAZOLIN <=4 SENSITIVE Sensitive     CEFTRIAXONE <=1 SENSITIVE Sensitive     CIPROFLOXACIN <=0.25 SENSITIVE Sensitive     GENTAMICIN <=1 SENSITIVE Sensitive     IMIPENEM <=0.25 SENSITIVE Sensitive     NITROFURANTOIN <=16 SENSITIVE Sensitive     TRIMETH/SULFA <=20 SENSITIVE Sensitive     AMPICILLIN/SULBACTAM 4 SENSITIVE Sensitive     PIP/TAZO <=4 SENSITIVE Sensitive     * >=100,000 COLONIES/mL KLEBSIELLA PNEUMONIAE  Culture, blood (routine x 2)     Status: Abnormal   Collection Time: 07/01/19  2:31 PM   Specimen: BLOOD  Result Value Ref Range Status   Specimen Description BLOOD LEFT ANTECUBITAL  Final   Special Requests   Final    BOTTLES DRAWN AEROBIC AND ANAEROBIC Blood Culture results may not be optimal due to an  inadequate volume of blood received in culture bottles   Culture  Setup Time   Final    GRAM NEGATIVE RODS ANAEROBIC BOTTLE ONLY CRITICAL VALUE NOTED.  VALUE IS CONSISTENT WITH PREVIOUSLY REPORTED AND CALLED VALUE.    Culture (A)  Final    ESCHERICHIA COLI SUSCEPTIBILITIES PERFORMED ON PREVIOUS CULTURE WITHIN THE LAST 5 DAYS. Performed at [facility], [place] [address], [place],  [address]    Report Status 07/05/2019 FINAL  Final  Culture, blood (routine x 2)     Status: Abnormal   Collection Time: 07/01/19  2:31 PM   Specimen: BLOOD LEFT ARM  Result Value Ref Range Status   Specimen Description BLOOD LEFT ARM  Final   Special Requests   Final    BOTTLES DRAWN AEROBIC AND ANAEROBIC Blood Culture adequate volume   Culture  Setup Time   Final    GRAM NEGATIVE RODS ANAEROBIC BOTTLE ONLY CRITICAL RESULT CALLED TO, READ BACK BY AND VERIFIED WITH: PHARMD G ABBOTT  [id number] AT 638 AM BY CM Performed at [facility], [place] [address] [place], [place] [address]    Culture ESCHERICHIA COLI (A)  Final   Report Status 07/05/2019 FINAL  Final   Organism ID, Bacteria ESCHERICHIA COLI  Final      Susceptibility   Escherichia coli - MIC*    AMPICILLIN >=32 RESISTANT Resistant     CEFAZOLIN <=4 SENSITIVE Sensitive     CEFEPIME <=1 SENSITIVE Sensitive     CEFTAZIDIME <=1 SENSITIVE Sensitive     CEFTRIAXONE <=1 SENSITIVE Sensitive     CIPROFLOXACIN >=4 RESISTANT Resistant     GENTAMICIN <=1 SENSITIVE Sensitive     IMIPENEM <=0.25 SENSITIVE Sensitive     TRIMETH/SULFA >=320 RESISTANT Resistant     AMPICILLIN/SULBACTAM 8 SENSITIVE Sensitive     PIP/TAZO <=4 SENSITIVE Sensitive     * ESCHERICHIA COLI  Blood Culture ID Panel (Reflexed)     Status: Abnormal   Collection Time: 07/01/19  2:31 PM  Result Value Ref Range Status   Enterococcus species NOT DETECTED NOT DETECTED Final   Listeria monocytogenes NOT DETECTED NOT DETECTED Final   Staphylococcus species NOT DETECTED NOT  DETECTED Final   Staphylococcus aureus (BCID) NOT DETECTED NOT DETECTED Final   Streptococcus species NOT DETECTED NOT DETECTED Final   Streptococcus agalactiae NOT DETECTED NOT DETECTED Final   Streptococcus pneumoniae NOT DETECTED NOT DETECTED Final   Streptococcus pyogenes NOT DETECTED NOT DETECTED Final   Acinetobacter baumannii NOT DETECTED NOT DETECTED Final   Enterobacteriaceae species DETECTED (A) NOT DETECTED Final    Comment: Enterobacteriaceae represent a large family of gram-negative bacteria, not a single organism. CRITICAL RESULT CALLED TO, READ BACK BY AND VERIFIED WITH: PHARMD G ABBOTT [id number] AT 73 AM Y CM    Enterobacter cloacae complex NOT DETECTED NOT DETECTED Final   Escherichia coli DETECTED (A) NOT DETECTED Final    Comment: CRITICAL RESULT CALLED TO, READ BACK BY AND VERIFIED WITH: PHARMD G ABBOTT [id number] AT [id number] AM BY CM    Klebsiella oxytoca NOT DETECTED NOT DETECTED Final   Klebsiella pneumoniae NOT DETECTED NOT DETECTED Final   Proteus species NOT DETECTED NOT DETECTED Final   Serratia marcescens NOT DETECTED NOT DETECTED Final   Carbapenem resistance NOT DETECTED NOT DETECTED Final   Haemophilus influenzae NOT DETECTED NOT DETECTED Final   Neisseria meningitidis NOT DETECTED NOT DETECTED Final   Pseudomonas aeruginosa NOT DETECTED NOT DETECTED Final   Candida albicans NOT DETECTED NOT DETECTED Final   Candida glabrata NOT DETECTED NOT DETECTED Final   Candida krusei NOT DETECTED NOT DETECTED Final   Candida parapsilosis NOT DETECTED NOT DETECTED Final   Candida tropicalis NOT DETECTED NOT DETECTED Final    Comment: Performed at [facility], [place] [address] [place], [place] [address]  SARS CORONAVIRUS 2 (TAT 6-24 HRS) Nasopharyngeal Nasopharyngeal Swab     Status: None   Collection Time: 07/01/19  4:44 PM   Specimen: Nasopharyngeal Swab  Result Value Ref Range Status   SARS Coronavirus 2 NEGATIVE NEGATIVE Final    Comment: (NOTE) SARS-CoV-2 target  nucleic acids are NOT DETECTED. The SARS-CoV-2 RNA is generally detectable in upper and lower respiratory specimens during the acute phase of infection. Negative results do not preclude SARS-CoV-2 infection, do not rule out co-infections with other pathogens, and should not be used as the sole basis for treatment or other patient management decisions. Negative results must be combined with clinical observations, patient history, and epidemiological information.  The expected result is Negative. Fact Sheet for Patients: SugarRoll.be Fact Sheet for Healthcare Providers: https://www.woods-mathews.com/ This test is not yet approved or cleared by the [place] FDA and  has been authorized for detection and/or diagnosis of SARS-CoV-2 by FDA under an Emergency Use Authorization (EUA). This EUA will remain  in effect (meaning this test can be used) for the duration of the COVID-19 declaration under Section 56 4(b)(1) of the Act, 21 U.S.C. section 360bbb-3(b)(1), unless the authorization is terminated or revoked sooner. Performed at [facility], [place] [address] [place], [place] [address]     Radiology Studies: ECHOCARDIOGRAM COMPLETE  Result Date: 07/06/2019    ECHOCARDIOGRAM REPORT   Patient Name:   KOLBEE QUILL Date of Exam: 07/06/2019 Medical Rec #:  [id number]         Height:       64.0 in Accession #:    [id number]        Weight:       135.0 lb Date of Birth:  October 30, 1940         BSA:          1.655 m Patient Age:    9 years          BP:           173/90 mmHg Patient Gender: F                 HR:           76 bpm. Exam Location:  Inpatient Procedure: 2D Echo, Cardiac Doppler and Color Doppler Indications:    Syncope 780.2/R55  History:        Patient has prior history of Echocardiogram examinations, most                 recent 09/10/2018. Previous Myocardial Infarction and CAD; Risk                 Factors:Diabetes and Hypertension.   Sonographer:    Clayton Lefort RDCS (AE) Referring Phys: [id number] [facility]  Sonographer Comments: Suboptimal parasternal window and no subcostal window. Limited patient mobility. IMPRESSIONS  1. Left ventricular ejection fraction, by estimation, is 60 to 65%. The left ventricle has normal function. The left ventricle has no regional wall motion abnormalities. There is mild left ventricular hypertrophy. Left ventricular diastolic parameters are indeterminate.  2. Right ventricular systolic function is normal. The right ventricular size is normal.  3. The mitral valve is degenerative. Trivial mitral valve regurgitation. No evidence of mitral stenosis.  4. The aortic valve is tricuspid. Aortic valve regurgitation is trivial. Mild aortic valve stenosis.  5. The inferior vena cava is normal in size with greater than 50% respiratory variability, suggesting right atrial pressure of 3 mmHg. FINDINGS  Left Ventricle: Left ventricular ejection fraction, by estimation, is 60 to 65%. The left ventricle has normal function. The left ventricle has no regional wall motion abnormalities. The left ventricular internal cavity size was normal in size. There is  mild left ventricular hypertrophy. Left ventricular diastolic parameters are indeterminate. Right Ventricle: The right ventricular size is normal. No increase in right ventricular wall thickness. Right ventricular systolic function is normal. Left Atrium: Left atrial size was normal in size. Right Atrium: Right atrial size was normal in size. Pericardium: Trivial pericardial effusion is present. The pericardial effusion is localized near the right atrium. Mitral Valve: The mitral valve is degenerative in appearance. There is moderate thickening of the mitral valve leaflet(s).  There is moderate calcification of the mitral valve leaflet(s). Normal mobility of the mitral valve leaflets. Moderate mitral annular calcification. Trivial mitral valve regurgitation. No evidence of mitral  valve stenosis. MV peak gradient, 6.6 mmHg. The mean mitral valve gradient is 2.0 mmHg. Tricuspid Valve: The tricuspid valve is normal in structure. Tricuspid valve regurgitation is mild . No evidence of tricuspid stenosis. Aortic Valve: The aortic valve is tricuspid. . There is moderate thickening and moderate calcification of the aortic valve. Aortic valve regurgitation is trivial. Mild aortic stenosis is present. There is moderate thickening of the aortic valve. There is  moderate calcification of the aortic valve. Aortic valve mean gradient measures 4.3 mmHg. Aortic valve peak gradient measures 9.5 mmHg. Aortic valve area, by VTI measures 1.43 cm. Pulmonic Valve: The pulmonic valve was normal in structure. Pulmonic valve regurgitation is not visualized. No evidence of pulmonic stenosis. Aorta: The aortic root is normal in size and structure. Venous: The inferior vena cava is normal in size with greater than 50% respiratory variability, suggesting right atrial pressure of 3 mmHg. IAS/Shunts: No atrial level shunt detected by color flow Doppler.  LEFT VENTRICLE PLAX 2D LVIDd:         2.96 cm  Diastology LVIDs:         2.13 cm  LV e' lateral:   3.53 cm/s LV PW:         1.31 cm  LV E/e' lateral: 24.8 LV IVS:        1.22 cm  LV e' medial:    3.45 cm/s LVOT diam:     1.60 cm  LV E/e' medial:  25.4 LV SV:         44 LV SV Index:   26 LVOT Area:     2.01 cm  RIGHT VENTRICLE RV Basal diam:  2.35 cm RV S prime:     14.90 cm/s TAPSE (M-mode): 2.0 cm LEFT ATRIUM             Index       RIGHT ATRIUM           Index LA diam:        3.00 cm 1.81 cm/m  RA Area:     10.20 cm LA Vol (A2C):   47.5 ml 28.69 ml/m RA Volume:   18.10 ml  10.93 ml/m LA Vol (A4C):   39.4 ml 23.80 ml/m LA Biplane Vol: 46.3 ml 27.97 ml/m  AORTIC VALVE AV Area (Vmax):    1.34 cm AV Area (Vmean):   1.38 cm AV Area (VTI):     1.43 cm AV Vmax:           154.33 cm/s AV Vmean:          100.733 cm/s AV VTI:            0.306 m AV Peak Grad:      9.5  mmHg AV Mean Grad:      4.3 mmHg LVOT Vmax:         103.00 cm/s LVOT Vmean:        69.000 cm/s LVOT VTI:          0.218 m LVOT/AV VTI ratio: 0.71  AORTA Ao Root diam: 2.80 cm MITRAL VALVE                TRICUSPID VALVE MV Area (PHT): 2.52 cm     TR Peak grad:   29.2 mmHg MV Peak grad:  6.6 mmHg  TR Vmax:        270.00 cm/s MV Mean grad:  2.0 mmHg MV Vmax:       1.28 m/s     SHUNTS MV Vmean:      66.8 cm/s    Systemic VTI:  0.22 m MV Decel Time: 301 msec     Systemic Diam: 1.60 cm MV E velocity: 87.50 cm/s MV A velocity: 148.00 cm/s MV E/A ratio:  0.59 Jenkins Rouge MD Electronically signed by Jenkins Rouge MD Signature Date/Time: 07/06/2019/10:55:24 AM    Final       Julionna Marczak T. [place]  If 7PM-7AM, please contact night-coverage [email or web address] Password [email or web address] 07/06/2019, 11:19 AM

## 2019-07-06 NOTE — Progress Notes (Signed)
Carotid duplex       has been completed. Preliminary results can be found under CV proc through chart review. Shenae Bonanno, BS, RDMS, RVT   

## 2019-07-06 NOTE — Plan of Care (Signed)
  Problem: Education: Goal: Knowledge of General Education information will improve Description: Including pain rating scale, medication(s)/side effects and non-pharmacologic comfort measures Outcome: Not Progressing   Problem: Health Behavior/Discharge Planning: Goal: Ability to manage health-related needs will improve Outcome: Not Progressing  Patient is one person assist with most ADL's, needs tray set up, bed alarm on as patient unable to use call light.  Problem: Activity: Goal: Risk for activity intolerance will decrease Outcome: Not Progressing  Patient with difficulty standing at bedside without assistance.  Problem: Safety: Goal: Ability to remain free from injury will improve Outcome: Progressing Patient remains fall free at this time.

## 2019-07-06 NOTE — TOC Progression Note (Signed)
Transition of Care [facility]) - Progression Note    Patient Details  Name: [name] [name] MRN: [id number] Date of Birth: 09-19-1940  Transition of Care [facility]) CM/SW [place], [place] Phone Number: 07/06/2019, 12:40 PM  Clinical Narrative:     CSW called pts husband [name] [name] on phone to give bed offers. PT has been accepted to [facility], [facility], [facility] and [facility]. CSW printed out the rating list for pt and left them on side table in pts room. [name] [name] stated he will be at the hospital after 5 to visit pt. [name] [name] stated he would look over the information and have an answer tomorrow. CSW informed [name] [name] that a different social worker will reach back out to morrow and will initiate insurance auth.   CSW will continue to follow.   Expected Discharge Plan: [place] Barriers to Discharge: Continued Medical Work up  Expected Discharge Plan and Services Expected Discharge Plan: [place] arrangements for the past 2 months: Single Family Home                                       Social Determinants of Health (SDOH) Interventions    Readmission Risk Interventions Readmission Risk Prevention Plan 07/04/2019  Transportation Screening Complete  PCP or Specialist Appt within 3-5 Days Complete  HRI or Home Care Consult Complete  Medication Review (RN Care Manager) Referral to Pharmacy  Some recent data might be hidden

## 2019-07-06 NOTE — Progress Notes (Signed)
  Echocardiogram 2D Echocardiogram has been performed.  [name] [name] 07/06/2019, 10:04 AM

## 2019-07-07 LAB — CBC
HCT: 34 % — ABNORMAL LOW (ref 36.0–46.0)
Hemoglobin: 10.4 g/dL — ABNORMAL LOW (ref 12.0–15.0)
MCH: 26.9 pg (ref 26.0–34.0)
MCHC: 30.6 g/dL (ref 30.0–36.0)
MCV: 87.9 fL (ref 80.0–100.0)
Platelets: 321 10*3/uL (ref 150–400)
RBC: 3.87 MIL/uL (ref 3.87–5.11)
RDW: 19.9 % — ABNORMAL HIGH (ref 11.5–15.5)
WBC: 8 10*3/uL (ref 4.0–10.5)
nRBC: 0 % (ref 0.0–0.2)

## 2019-07-07 LAB — RENAL FUNCTION PANEL
Albumin: 2.8 g/dL — ABNORMAL LOW (ref 3.5–5.0)
Anion gap: 11 (ref 5–15)
BUN: 16 mg/dL (ref 8–23)
CO2: 25 mmol/L (ref 22–32)
Calcium: 9.4 mg/dL (ref 8.9–10.3)
Chloride: 109 mmol/L (ref 98–111)
Creatinine, Ser: 0.97 mg/dL (ref 0.44–1.00)
GFR calc Af Amer: >60 mL/min (ref >60)
GFR calc non Af Amer: 55 mL/min — ABNORMAL LOW (ref >60)
Glucose, Bld: 91 mg/dL (ref 70–99)
Phosphorus: 3.5 mg/dL (ref 2.5–4.6)
Potassium: 3.9 mmol/L (ref 3.5–5.1)
Sodium: 145 mmol/L (ref 135–145)

## 2019-07-07 LAB — MAGNESIUM: Magnesium: 1.9 mg/dL (ref 1.7–2.4)

## 2019-07-07 MED ORDER — MIDODRINE HCL 5 MG PO TABS
5.0000 mg | ORAL_TABLET | Freq: Three times a day (TID) | ORAL | Status: DC
  Administered 2019-07-08 – 2019-07-09 (×5): 5 mg via ORAL
  Filled 2019-07-07 (×5): qty 1

## 2019-07-07 NOTE — Progress Notes (Signed)
PROGRESS NOTE    [name] [name]  [id number] DOB: January 20, 1941 DOA: 07/01/2019 PCP: Jerrol Banana., MD   No chief complaint on file.   Brief Narrative79 y.o.femalewith history of breast cancer, CAD, HTN and dementia presenting with recurrent syncopal episode and admitted for syncope and sepsis due to E. coli bacteremia, and E. coli and Klebsiella UTI.  She was treated with ceftriaxone and transitioned to Ancef based on sensitivity.  Patient was previously hospitalized with AKI in the setting of ureteral obstruction requiring Foley catheter about a month ago.  At that time, Foley was discontinued on discharge.  However, she was found to have acute urinary tension when she presented to the urology office for follow-up prior to this admission.  This admission, patient was found to have E. coli and Klebsiella UTI as well as E. coli bacteremia.   Cardiology consulted on this admission and recommended outpatient follow-up and signed off.  Patient with significant orthostasis.  Therapy recommended SNF.   Assessment & Plan:   Active Problems:   Sepsis ([place])   Severe sepsis due to catheter associated ecoli UTI and bacteremia, and Klebsiella UTI: POA.  evidence of endorgan damage including confusion, syncope, hypotension and lactic acidosis on admission. -Sepsis physiology resolved. -Completed 7 days of antibiotic course with ceftriaxone and Omnicef on 4/30.  Recurrent syncope/orthostatic hypotension-continues with significant orthostasis.  Blood pressure dropped from 1 62-1 05 from laying down to standing up.  TSH and cortisol within normal. -Orthostatic vitals every shift. -Cardiology recommending outpatient follow-up. -Apply TED hose-unfortunately not helping due to significant muscle mass/subcu fat loss. -Echocardiogram ejection fraction 60 to 65%.  No wall motion abnormalities.  Mild LVH.  Trivial pericardial effusion localized near the right atrium. Calcified mitral  valves.  No evidence of mitral valve stenosis.  Tricuspid aortic valve. -Carotid ultrasound no significant stenosis seen. TED hose already ordered.  Will order abdominal binder hopefully to improve orthostasis. Advised her to get up from bed very slowly.   Hypertension: On lisinopril at home.  -Hold home lisinopril. -As needed hydralazine for standing SBP above 160 or standing DBP above 100 -Requested RN to chart standing blood pressure.  Urethra obstruction-with indwelling Foley catheter inserted by urology. -Likely discharge home with indwelling Foley catheter for outpatient urology follow-up.  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 -Continue holding home lisinopril -Avoid nephrotoxic meds -Continue monitoring  Hypokalemia: Potassium 3.9 after replacement.  Magnesium 1.9.  Shortened PR-Interval -Outpatient follow-up with cardiology.  History of Breast cancer History of Breast cancer -continue home Femara  Cognitive impairment/dementia without behavioral disturbance: She is oriented x4 except to date today but does not seem to have great insight. -Frequent orientation and delirium precautions  Debility/physical deconditioning-uses walker at baseline. -PT/OT-recommended SNF.  Goal of care/DNR/DNI DVT prophylaxis: Subcu heparin Code Status: DNR/DNI Family Communication:  Discussed with patient status is: Inpatient  Remains inpatient appropriate because:Hemodynamically unstable with significant orthostasis   Dispo: The patient is from: Home  Anticipated d/c is to: SNF  Anticipated d/c date is: > 3 days  Patient currently is not medically stable to d/c.due to significant orthostatic hypotension     Consultants:  Cardiology  Procedures:none Antimicrobials: Anti-infectives (From admission, onward)   Start     Dose/Rate Route Frequency Ordered Stop   07/05/19 2200  cefdinir (OMNICEF)  capsule 300 mg  Status:  Discontinued     300 mg Oral Every 12 hours 07/05/19 1241 07/06/19 1006   07/04/19 0900  ceFAZolin (ANCEF) IVPB  2g/100 mL premix  Status:  Discontinued     2 g 200 mL/hr over 30 Minutes Intravenous Every 8 hours 07/04/19 0847 07/05/19 1241   07/02/19 1000  cefTRIAXone (ROCEPHIN) 1 g in sodium chloride 0.9 % 100 mL IVPB  Status:  Discontinued     1 g 200 mL/hr over 30 Minutes Intravenous Every 24 hours 07/01/19 1538 07/01/19 1541   07/02/19 0800  cefTRIAXone (ROCEPHIN) 2 g in sodium chloride 0.9 % 100 mL IVPB  Status:  Discontinued     2 g 200 mL/hr over 30 Minutes Intravenous Every 24 hours 07/02/19 0643 07/04/19 0847   07/01/19 1430  aztreonam (AZACTAM) 1 g in sodium chloride 0.9 % 100 mL IVPB  Status:  Discontinued     1 g 200 mL/hr over 30 Minutes Intravenous  Once 07/01/19 1417 07/01/19 1419   07/01/19 1430  cefTRIAXone (ROCEPHIN) 1 g in sodium chloride 0.9 % 100 mL IVPB  Status:  Discontinued     1 g 200 mL/hr over 30 Minutes Intravenous Every 24 hours 07/01/19 1425 07/02/19 0643       Subjective: She is resting in bed she is complaining of a headache other than that other than that she has no other complaints today no overnight events no nausea vomiting.  Objective: Vitals:   07/06/19 2346 07/06/19 2348 07/06/19 2351 07/07/19 0812  BP: (!) 170/82 113/61 (!) 91/56 (!) 149/69  Pulse: 66 77 86 70  Resp: 18     Temp: 98.5 F (36.9 C)   97.6 F (36.4 C)  TempSrc: Oral     SpO2: 98%   99%  Weight:      Height:        Intake/Output Summary (Last 24 hours) at 07/07/2019 1028 Last data filed at 07/07/2019 0600 Gross per 24 hour  Intake --  Output 550 ml  Net -550 ml   Filed Weights   07/01/19 1421  Weight: 61.2 kg    Examination:  General exam: Appears calm and comfortable  Respiratory system: Clear to auscultation. Respiratory effort normal. Cardiovascular system: S1 & S2 heard, RRR. No JVD, murmurs, rubs, gallops or clicks. No pedal  edema. Gastrointestinal system: Abdomen is nondistended, soft and nontender. No organomegaly or masses felt. Normal bowel sounds heard. Central nervous system: Alert and oriented. No focal neurological deficits. Extremities: Symmetric 5 x 5 power. Skin: No rashes, lesions or ulcers Psychiatry: Judgement and insight appear normal. Mood & affect appropriate.     Data Reviewed: I have personally reviewed following labs and imaging studies  CBC: Recent Labs  Lab 06/30/19 1110 06/30/19 1110 07/01/19 1254 07/02/19 0538 07/03/19 0235 07/04/19 0137 07/05/19 0328 07/06/19 0259 07/07/19 0236  WBC 9.6   < > 9.2   < > 6.1 5.0 5.5 7.7 8.0  NEUTROABS 7.6  --  7.4  --   --   --   --   --   --   HGB 10.1*   < > 10.4*   < > 10.5* 10.6* 10.2* 10.0* 10.4*  HCT 32.2*   < > 34.6*   < > 34.3* 33.9* 31.9* 32.4* 34.0*  MCV 87.0   < > 88.9   < > 88.4 87.6 86.7 88.3 87.9  PLT 211   < > 180   < > 228 269 277 295 321   < > = values in this interval not displayed.    Basic Metabolic Panel: Recent Labs  Lab 07/03/19 0235 07/04/19 [id number] 07/05/19 0328 07/06/19 0259  07/07/19 0236  NA 136 142 143 142 145  K 3.4* 3.8 3.0* 3.8 3.9  CL 103 107 108 109 109  CO2 22 16* 24 23 25   GLUCOSE 111* 106* 102* 97 91  BUN 16 15 13 15 16   CREATININE 1.20* 1.18* 1.03* 1.24* 0.97  CALCIUM 8.7* 8.9 8.9 9.0 9.4  MG  --   --  1.8 1.9 1.9  PHOS  --   --  3.4 2.8 3.5    GFR: Estimated Creatinine Clearance: 39.9 mL/min (by C-G formula based on SCr of 0.97 mg/dL).  Liver Function Tests: Recent Labs  Lab 06/30/19 1110 06/30/19 1110 07/01/19 1254 07/04/19 0137 07/05/19 0328 07/06/19 0259 07/07/19 0236  AST 16  --  36 25  --   --   --   ALT 10  --  15 18  --   --   --   ALKPHOS 48  --  50 40  --   --   --   BILITOT 1.3*  --  1.1 0.6  --   --   --   PROT 6.7  --  6.3* 5.8*  --   --   --   ALBUMIN 3.3*   < > 2.8* 2.6* 2.6* 2.8* 2.8*   < > = values in this interval not displayed.    CBG: Recent Labs  Lab  07/01/19 1255 07/05/19 2005  GLUCAP 122* 105*     Recent Results (from the past 240 hour(s))  Respiratory Panel by RT PCR (Flu A&B, Covid) - Nasopharyngeal Swab     Status: None   Collection Time: 06/27/19  8:06 PM   Specimen: Nasopharyngeal Swab  Result Value Ref Range Status   SARS Coronavirus 2 by RT PCR NEGATIVE NEGATIVE Final    Comment: (NOTE) SARS-CoV-2 target nucleic acids are NOT DETECTED. The SARS-CoV-2 RNA is generally detectable in upper respiratoy specimens during the acute phase of infection. The lowest concentration of SARS-CoV-2 viral copies this assay can detect is 131 copies/mL. A negative result does not preclude SARS-Cov-2 infection and should not be used as the sole basis for treatment or other patient management decisions. A negative result may occur with  improper specimen collection/handling, submission of specimen other than nasopharyngeal swab, presence of viral mutation(s) within the areas targeted by this assay, and inadequate number of viral copies (<131 copies/mL). A negative result must be combined with clinical observations, patient history, and epidemiological information. The expected result is Negative. Fact Sheet for Patients:  PinkCheek.be Fact Sheet for Healthcare Providers:  GravelBags.it This test is not yet ap proved or cleared by the [place] FDA and  has been authorized for detection and/or diagnosis of SARS-CoV-2 by FDA under an Emergency Use Authorization (EUA). This EUA will remain  in effect (meaning this test can be used) for the duration of the COVID-19 declaration under Section 564(b)(1) of the Act, 21 U.S.C. section 360bbb-3(b)(1), unless the authorization is terminated or revoked sooner.    Influenza A by PCR NEGATIVE NEGATIVE Final   Influenza B by PCR NEGATIVE NEGATIVE Final    Comment: (NOTE) The Xpert Xpress SARS-CoV-2/FLU/RSV assay is intended as an aid in   the diagnosis of influenza from Nasopharyngeal swab specimens and  should not be used as a sole basis for treatment. Nasal washings and  aspirates are unacceptable for Xpert Xpress SARS-CoV-2/FLU/RSV  testing. Fact Sheet for Patients: PinkCheek.be Fact Sheet for Healthcare Providers: GravelBags.it This test is not yet approved or cleared by the  [place] Architectural technologist and  has been authorized for detection and/or diagnosis of SARS-CoV-2 by  FDA under an Print production planner (EUA). This EUA will remain  in effect (meaning this test can be used) for the duration of the  Covid-19 declaration under Section 564(b)(1) of the Act, 21  U.S.C. section 360bbb-3(b)(1), unless the authorization is  terminated or revoked. Performed at [facility], [address] [place], [place] [address]   Urine culture     Status: Abnormal   Collection Time: 06/30/19  1:45 PM   Specimen: Urine, Random  Result Value Ref Range Status   Specimen Description   Final    URINE, RANDOM Performed at [facility], [address] [place], [place] [address]    Special Requests   Final    NONE Performed at [facility], [address] [place], [place] [address]    Culture (A)  Final    >=100,000 COLONIES/mL ESCHERICHIA COLI 80,000 COLONIES/mL KLEBSIELLA PNEUMONIAE    Report Status 07/02/2019 FINAL  Final   Organism ID, Bacteria ESCHERICHIA COLI (A)  Final   Organism ID, Bacteria KLEBSIELLA PNEUMONIAE (A)  Final      Susceptibility   Escherichia coli - MIC*    AMPICILLIN >=32 RESISTANT Resistant     CEFAZOLIN <=4 SENSITIVE Sensitive     CEFTRIAXONE <=1 SENSITIVE Sensitive     CIPROFLOXACIN >=4 RESISTANT Resistant     GENTAMICIN <=1 SENSITIVE Sensitive     IMIPENEM <=0.25 SENSITIVE Sensitive     NITROFURANTOIN <=16 SENSITIVE Sensitive     TRIMETH/SULFA >=320 RESISTANT Resistant     AMPICILLIN/SULBACTAM 8 SENSITIVE  Sensitive     PIP/TAZO <=4 SENSITIVE Sensitive     * >=100,000 COLONIES/mL ESCHERICHIA COLI   Klebsiella pneumoniae - MIC*    AMPICILLIN RESISTANT Resistant     CEFAZOLIN <=4 SENSITIVE Sensitive     CEFTRIAXONE <=1 SENSITIVE Sensitive     CIPROFLOXACIN <=0.25 SENSITIVE Sensitive     GENTAMICIN <=1 SENSITIVE Sensitive     IMIPENEM <=0.25 SENSITIVE Sensitive     NITROFURANTOIN 32 SENSITIVE Sensitive     TRIMETH/SULFA <=20 SENSITIVE Sensitive     AMPICILLIN/SULBACTAM 4 SENSITIVE Sensitive     PIP/TAZO <=4 SENSITIVE Sensitive     * 80,000 COLONIES/mL KLEBSIELLA PNEUMONIAE  Urine culture     Status: Abnormal   Collection Time: 07/01/19 12:54 PM   Specimen: Urine, Random  Result Value Ref Range Status   Specimen Description URINE, RANDOM  Final   Special Requests SITE NOT SPECIFIED  Final   Culture (A)  Final    >=100,000 COLONIES/mL ESCHERICHIA COLI >=100,000 COLONIES/mL KLEBSIELLA PNEUMONIAE    Report Status 07/03/2019 FINAL  Final   Organism ID, Bacteria ESCHERICHIA COLI (A)  Final   Organism ID, Bacteria KLEBSIELLA PNEUMONIAE (A)  Final      Susceptibility   Escherichia coli - MIC*    AMPICILLIN >=32 RESISTANT Resistant     CEFAZOLIN <=4 SENSITIVE Sensitive     CEFTRIAXONE <=1 SENSITIVE Sensitive     CIPROFLOXACIN >=4 RESISTANT Resistant     GENTAMICIN <=1 SENSITIVE Sensitive     IMIPENEM <=0.25 SENSITIVE Sensitive     NITROFURANTOIN <=16 SENSITIVE Sensitive     TRIMETH/SULFA >=320 RESISTANT Resistant     AMPICILLIN/SULBACTAM 8 SENSITIVE Sensitive     PIP/TAZO <=4 SENSITIVE Sensitive     * >=100,000 COLONIES/mL ESCHERICHIA COLI   Klebsiella pneumoniae - MIC*    AMPICILLIN RESISTANT Resistant     CEFAZOLIN <=  4 SENSITIVE Sensitive     CEFTRIAXONE <=1 SENSITIVE Sensitive     CIPROFLOXACIN <=0.25 SENSITIVE Sensitive     GENTAMICIN <=1 SENSITIVE Sensitive     IMIPENEM <=0.25 SENSITIVE Sensitive     NITROFURANTOIN <=16 SENSITIVE Sensitive     TRIMETH/SULFA <=20 SENSITIVE  Sensitive     AMPICILLIN/SULBACTAM 4 SENSITIVE Sensitive     PIP/TAZO <=4 SENSITIVE Sensitive     * >=100,000 COLONIES/mL KLEBSIELLA PNEUMONIAE  Culture, blood (routine x 2)     Status: Abnormal   Collection Time: 07/01/19  2:31 PM   Specimen: BLOOD  Result Value Ref Range Status   Specimen Description BLOOD LEFT ANTECUBITAL  Final   Special Requests   Final    BOTTLES DRAWN AEROBIC AND ANAEROBIC Blood Culture results may not be optimal due to an inadequate volume of blood received in culture bottles   Culture  Setup Time   Final    GRAM NEGATIVE RODS ANAEROBIC BOTTLE ONLY CRITICAL VALUE NOTED.  VALUE IS CONSISTENT WITH PREVIOUSLY REPORTED AND CALLED VALUE.    Culture (A)  Final    ESCHERICHIA COLI SUSCEPTIBILITIES PERFORMED ON PREVIOUS CULTURE WITHIN THE LAST 5 DAYS. Performed at [facility], [place] [address] [place], [place] [address]    Report Status 07/05/2019 FINAL  Final  Culture, blood (routine x 2)     Status: Abnormal   Collection Time: 07/01/19  2:31 PM   Specimen: BLOOD LEFT ARM  Result Value Ref Range Status   Specimen Description BLOOD LEFT ARM  Final   Special Requests   Final    BOTTLES DRAWN AEROBIC AND ANAEROBIC Blood Culture adequate volume   Culture  Setup Time   Final    GRAM NEGATIVE RODS ANAEROBIC BOTTLE ONLY CRITICAL RESULT CALLED TO, READ BACK BY AND VERIFIED WITH: PHARMD G ABBOTT [id number] AT 64 AM BY CM Performed at [facility], [address] [place], [place] [address]    Culture ESCHERICHIA COLI (A)  Final   Report Status 07/05/2019 FINAL  Final   Organism ID, Bacteria ESCHERICHIA COLI  Final      Susceptibility   Escherichia coli - MIC*    AMPICILLIN >=32 RESISTANT Resistant     CEFAZOLIN <=4 SENSITIVE Sensitive     CEFEPIME <=1 SENSITIVE Sensitive     CEFTAZIDIME <=1 SENSITIVE Sensitive     CEFTRIAXONE <=1 SENSITIVE Sensitive     CIPROFLOXACIN >=4 RESISTANT Resistant     GENTAMICIN <=1 SENSITIVE Sensitive     IMIPENEM <=0.25  SENSITIVE Sensitive     TRIMETH/SULFA >=320 RESISTANT Resistant     AMPICILLIN/SULBACTAM 8 SENSITIVE Sensitive     PIP/TAZO <=4 SENSITIVE Sensitive     * ESCHERICHIA COLI  Blood Culture ID Panel (Reflexed)     Status: Abnormal   Collection Time: 07/01/19  2:31 PM  Result Value Ref Range Status   Enterococcus species NOT DETECTED NOT DETECTED Final   Listeria monocytogenes NOT DETECTED NOT DETECTED Final   Staphylococcus species NOT DETECTED NOT DETECTED Final   Staphylococcus aureus (BCID) NOT DETECTED NOT DETECTED Final   Streptococcus species NOT DETECTED NOT DETECTED Final   Streptococcus agalactiae NOT DETECTED NOT DETECTED Final   Streptococcus pneumoniae NOT DETECTED NOT DETECTED Final   Streptococcus pyogenes NOT DETECTED NOT DETECTED Final   Acinetobacter baumannii NOT DETECTED NOT DETECTED Final   Enterobacteriaceae species DETECTED (A) NOT DETECTED Final    Comment: Enterobacteriaceae represent a large family of gram-negative bacteria, not a single organism.  CRITICAL RESULT CALLED TO, READ BACK BY AND VERIFIED WITH: PHARMD G ABBOTT [id number] AT 24 AM Y CM    Enterobacter cloacae complex NOT DETECTED NOT DETECTED Final   Escherichia coli DETECTED (A) NOT DETECTED Final    Comment: CRITICAL RESULT CALLED TO, READ BACK BY AND VERIFIED WITH: PHARMD G ABBOTT [id number] AT [id number] AM BY CM    Klebsiella oxytoca NOT DETECTED NOT DETECTED Final   Klebsiella pneumoniae NOT DETECTED NOT DETECTED Final   Proteus species NOT DETECTED NOT DETECTED Final   Serratia marcescens NOT DETECTED NOT DETECTED Final   Carbapenem resistance NOT DETECTED NOT DETECTED Final   Haemophilus influenzae NOT DETECTED NOT DETECTED Final   Neisseria meningitidis NOT DETECTED NOT DETECTED Final   Pseudomonas aeruginosa NOT DETECTED NOT DETECTED Final   Candida albicans NOT DETECTED NOT DETECTED Final   Candida glabrata NOT DETECTED NOT DETECTED Final   Candida krusei NOT DETECTED NOT DETECTED Final   Candida  parapsilosis NOT DETECTED NOT DETECTED Final   Candida tropicalis NOT DETECTED NOT DETECTED Final    Comment: Performed at [facility], [place] [address], [place], [place] [address]  SARS CORONAVIRUS 2 (TAT 6-24 HRS) Nasopharyngeal Nasopharyngeal Swab     Status: None   Collection Time: 07/01/19  4:44 PM   Specimen: Nasopharyngeal Swab  Result Value Ref Range Status   SARS Coronavirus 2 NEGATIVE NEGATIVE Final    Comment: (NOTE) SARS-CoV-2 target nucleic acids are NOT DETECTED. The SARS-CoV-2 RNA is generally detectable in upper and lower respiratory specimens during the acute phase of infection. Negative results do not preclude SARS-CoV-2 infection, do not rule out co-infections with other pathogens, and should not be used as the sole basis for treatment or other patient management decisions. Negative results must be combined with clinical observations, patient history, and epidemiological information. The expected result is Negative. Fact Sheet for Patients: SugarRoll.be Fact Sheet for Healthcare Providers: https://www.woods-.com/ This test is not yet approved or cleared by the [place] FDA and  has been authorized for detection and/or diagnosis of SARS-CoV-2 by FDA under an Emergency Use Authorization (EUA). This EUA will remain  in effect (meaning this test can be used) for the duration of the COVID-19 declaration under Section 56 4(b)(1) of the Act, 21 U.S.C. section 360bbb-3(b)(1), unless the authorization is terminated or revoked sooner. Performed at [facility], [place] [address], [place], [place] [address]          Radiology Studies: ECHOCARDIOGRAM COMPLETE  Result Date: 07/06/2019    ECHOCARDIOGRAM REPORT   Patient Name:   MERDIS SLATE Date of Exam: 07/06/2019 Medical Rec #:  [id number]         Height:       64.0 in Accession #:    [id number]        Weight:       135.0 lb Date of Birth:  May 02, 1940          BSA:          1.655 m Patient Age:    40 years          BP:           173/90 mmHg Patient Gender: F                 HR:           76 bpm. Exam Location:  Inpatient Procedure: 2D Echo, Cardiac Doppler and Color Doppler Indications:    Syncope 780.2/R55  History:        Patient has prior history of Echocardiogram examinations, most                 recent 09/10/2018. Previous Myocardial Infarction and CAD; Risk                 Factors:Diabetes and Hypertension.  Sonographer:    Clayton Lefort RDCS (AE) Referring Phys: [id number] [facility]  Sonographer Comments: Suboptimal parasternal window and no subcostal window. Limited patient mobility. IMPRESSIONS  1. Left ventricular ejection fraction, by estimation, is 60 to 65%. The left ventricle has normal function. The left ventricle has no regional wall motion abnormalities. There is mild left ventricular hypertrophy. Left ventricular diastolic parameters are indeterminate.  2. Right ventricular systolic function is normal. The right ventricular size is normal.  3. The mitral valve is degenerative. Trivial mitral valve regurgitation. No evidence of mitral stenosis.  4. The aortic valve is tricuspid. Aortic valve regurgitation is trivial. Mild aortic valve stenosis.  5. The inferior vena cava is normal in size with greater than 50% respiratory variability, suggesting right atrial pressure of 3 mmHg. FINDINGS  Left Ventricle: Left ventricular ejection fraction, by estimation, is 60 to 65%. The left ventricle has normal function. The left ventricle has no regional wall motion abnormalities. The left ventricular internal cavity size was normal in size. There is  mild left ventricular hypertrophy. Left ventricular diastolic parameters are indeterminate. Right Ventricle: The right ventricular size is normal. No increase in right ventricular wall thickness. Right ventricular systolic function is normal. Left Atrium: Left atrial size was normal in size. Right Atrium: Right atrial size  was normal in size. Pericardium: Trivial pericardial effusion is present. The pericardial effusion is localized near the right atrium. Mitral Valve: The mitral valve is degenerative in appearance. There is moderate thickening of the mitral valve leaflet(s). There is moderate calcification of the mitral valve leaflet(s). Normal mobility of the mitral valve leaflets. Moderate mitral annular calcification. Trivial mitral valve regurgitation. No evidence of mitral valve stenosis. MV peak gradient, 6.6 mmHg. The mean mitral valve gradient is 2.0 mmHg. Tricuspid Valve: The tricuspid valve is normal in structure. Tricuspid valve regurgitation is mild . No evidence of tricuspid stenosis. Aortic Valve: The aortic valve is tricuspid. . There is moderate thickening and moderate calcification of the aortic valve. Aortic valve regurgitation is trivial. Mild aortic stenosis is present. There is moderate thickening of the aortic valve. There is  moderate calcification of the aortic valve. Aortic valve mean gradient measures 4.3 mmHg. Aortic valve peak gradient measures 9.5 mmHg. Aortic valve area, by VTI measures 1.43 cm. Pulmonic Valve: The pulmonic valve was normal in structure. Pulmonic valve regurgitation is not visualized. No evidence of pulmonic stenosis. Aorta: The aortic root is normal in size and structure. Venous: The inferior vena cava is normal in size with greater than 50% respiratory variability, suggesting right atrial pressure of 3 mmHg. IAS/Shunts: No atrial level shunt detected by color flow Doppler.  LEFT VENTRICLE PLAX 2D LVIDd:         2.96 cm  Diastology LVIDs:         2.13 cm  LV e' lateral:   3.53 cm/s LV PW:         1.31 cm  LV E/e' lateral: 24.8 LV IVS:        1.22 cm  LV e' medial:    3.45 cm/s LVOT diam:     1.60 cm  LV E/e' medial:  25.4 LV SV:         44 LV SV Index:   26 LVOT Area:     2.01 cm  RIGHT VENTRICLE RV Basal diam:  2.35 cm RV S prime:     14.90 cm/s TAPSE (M-mode): 2.0 cm LEFT ATRIUM              Index       RIGHT ATRIUM           Index LA diam:        3.00 cm 1.81 cm/m  RA Area:     10.20 cm LA Vol (A2C):   47.5 ml 28.69 ml/m RA Volume:   18.10 ml  10.93 ml/m LA Vol (A4C):   39.4 ml 23.80 ml/m LA Biplane Vol: 46.3 ml 27.97 ml/m  AORTIC VALVE AV Area (Vmax):    1.34 cm AV Area (Vmean):   1.38 cm AV Area (VTI):     1.43 cm AV Vmax:           154.33 cm/s AV Vmean:          100.733 cm/s AV VTI:            0.306 m AV Peak Grad:      9.5 mmHg AV Mean Grad:      4.3 mmHg LVOT Vmax:         103.00 cm/s LVOT Vmean:        69.000 cm/s LVOT VTI:          0.218 m LVOT/AV VTI ratio: 0.71  AORTA Ao Root diam: 2.80 cm MITRAL VALVE                TRICUSPID VALVE MV Area (PHT): 2.52 cm     TR Peak grad:   29.2 mmHg MV Peak grad:  6.6 mmHg     TR Vmax:        270.00 cm/s MV Mean grad:  2.0 mmHg MV Vmax:       1.28 m/s     SHUNTS MV Vmean:      66.8 cm/s    Systemic VTI:  0.22 m MV Decel Time: 301 msec     Systemic Diam: 1.60 cm MV E velocity: 87.50 cm/s MV A velocity: 148.00 cm/s MV E/A ratio:  0.59 Jenkins Rouge MD Electronically signed by Jenkins Rouge MD Signature Date/Time: 07/06/2019/10:55:24 AM    Final    VAS US CAROTID  Result Date: 07/06/2019 Carotid Arterial Duplex Study Indications: Syncope. Performing Technologist: June Leap RDMS, RVT  Examination Guidelines: A complete evaluation includes B-mode imaging, spectral Doppler, color Doppler, and power Doppler as needed of all accessible portions of each vessel. Bilateral testing is considered an integral part of a complete examination. Limited examinations for reoccurring indications may be performed as noted.  Right Carotid Findings: +----------+--------+--------+--------+-------------------------+--------+           PSV cm/sEDV cm/sStenosisPlaque Description       Comments +----------+--------+--------+--------+-------------------------+--------+ CCA Prox  75      16                                                 +----------+--------+--------+--------+-------------------------+--------+ CCA Distal44      10                                                +----------+--------+--------+--------+-------------------------+--------+  ICA Prox  44      16      1-39%   heterogenous and calcific         +----------+--------+--------+--------+-------------------------+--------+ ICA Distal60      24                                                +----------+--------+--------+--------+-------------------------+--------+ ECA       85      4                                                 +----------+--------+--------+--------+-------------------------+--------+ +----------+--------+-------+----------------+-------------------+           PSV cm/sEDV cmsDescribe        Arm Pressure (mmHG) +----------+--------+-------+----------------+-------------------+ QP:[id number]            Multiphasic, WNL                    +----------+--------+-------+----------------+-------------------+ +---------+--------+--+--------+--+---------+ VertebralPSV cm/s53EDV cm/s16Antegrade +---------+--------+--+--------+--+---------+  Left Carotid Findings: +----------+--------+--------+--------+------------------+--------+           PSV cm/sEDV cm/sStenosisPlaque DescriptionComments +----------+--------+--------+--------+------------------+--------+ CCA Prox  72      18                                         +----------+--------+--------+--------+------------------+--------+ CCA Distal57      11                                         +----------+--------+--------+--------+------------------+--------+ ICA Prox  41      11      1-39%   heterogenous               +----------+--------+--------+--------+------------------+--------+ ICA Distal71      25                                         +----------+--------+--------+--------+------------------+--------+ ECA       58      7                                           +----------+--------+--------+--------+------------------+--------+ +----------+--------+--------+------------+-------------------+           PSV cm/sEDV cm/sDescribe    Arm Pressure (mmHG) +----------+--------+--------+------------+-------------------+ Subclavian                Not assessed                    +----------+--------+--------+------------+-------------------+ +---------+--------+--------+--------------+ VertebralPSV cm/sEDV cm/sNot identified +---------+--------+--------+--------------+   Summary: Right Carotid: Velocities in the right ICA are consistent with a 1-39% stenosis. Left Carotid: Velocities in the left ICA are consistent with a 1-39% stenosis.  *See table(s) above for measurements and observations.     Preliminary         Scheduled Meds: . calcium-vitamin D  1 tablet Oral QODAY  .  Chlorhexidine Gluconate Cloth  6 each Topical Daily  . heparin  5,000 Units Subcutaneous Q8H  . letrozole  2.5 mg Oral Daily  . pantoprazole  40 mg Oral Daily   Continuous Infusions: . lactated ringers       LOS: 6 days     Georgette Shell, MD [facility]   To contact the attending provider between 7A-7P or the covering provider during after hours 7P-7A, please log into the web site [email or web address] and access using universal  password for that web site. If you do not have the password, please call the hospital operator.  07/07/2019, 10:28 AM

## 2019-07-08 LAB — RENAL FUNCTION PANEL
Albumin: 2.8 g/dL — ABNORMAL LOW (ref 3.5–5.0)
Anion gap: 12 (ref 5–15)
BUN: 23 mg/dL (ref 8–23)
CO2: 26 mmol/L (ref 22–32)
Calcium: 9.3 mg/dL (ref 8.9–10.3)
Chloride: 104 mmol/L (ref 98–111)
Creatinine, Ser: 1.17 mg/dL — ABNORMAL HIGH (ref 0.44–1.00)
GFR calc Af Amer: 51 mL/min — ABNORMAL LOW (ref >60)
GFR calc non Af Amer: 44 mL/min — ABNORMAL LOW (ref >60)
Glucose, Bld: 101 mg/dL — ABNORMAL HIGH (ref 70–99)
Phosphorus: 3.9 mg/dL (ref 2.5–4.6)
Potassium: 3.4 mmol/L — ABNORMAL LOW (ref 3.5–5.1)
Sodium: 142 mmol/L (ref 135–145)

## 2019-07-08 LAB — CBC
HCT: 33.3 % — ABNORMAL LOW (ref 36.0–46.0)
Hemoglobin: 10.2 g/dL — ABNORMAL LOW (ref 12.0–15.0)
MCH: 27.3 pg (ref 26.0–34.0)
MCHC: 30.6 g/dL (ref 30.0–36.0)
MCV: 89.3 fL (ref 80.0–100.0)
Platelets: 352 10*3/uL (ref 150–400)
RBC: 3.73 MIL/uL — ABNORMAL LOW (ref 3.87–5.11)
RDW: 20 % — ABNORMAL HIGH (ref 11.5–15.5)
WBC: 8.8 10*3/uL (ref 4.0–10.5)
nRBC: 0 % (ref 0.0–0.2)

## 2019-07-08 LAB — MAGNESIUM: Magnesium: 1.8 mg/dL (ref 1.7–2.4)

## 2019-07-08 MED ORDER — POTASSIUM CHLORIDE CRYS ER 20 MEQ PO TBCR
40.0000 meq | EXTENDED_RELEASE_TABLET | Freq: Once | ORAL | Status: AC
  Administered 2019-07-08: 40 meq via ORAL
  Filled 2019-07-08: qty 2

## 2019-07-08 MED ORDER — MAGNESIUM SULFATE 4 GM/100ML IV SOLN
4.0000 g | Freq: Once | INTRAVENOUS | Status: AC
  Administered 2019-07-08: 4 g via INTRAVENOUS
  Filled 2019-07-08: qty 100

## 2019-07-08 NOTE — Progress Notes (Signed)
PROGRESS NOTE    KORINA MACIAS  [id number] DOB: May 20, 1940 DOA: 07/01/2019 PCP: Jerrol Banana., MD    No chief complaint on file.   Brief Narrative: 79 y.o.femalewith history of breast cancer, CAD, HTN and dementia presenting with recurrent syncopal episode and admitted for syncope and sepsis due to E. coli bacteremia, and E. coli and Klebsiella UTI. She was treated with ceftriaxone and transitioned to Ancef based on sensitivity.  Patient was previously hospitalized with AKI in the setting of ureteral obstruction requiring Foley catheter about a month ago. At that time, Foley was discontinued on discharge. However, she was found to have acute urinary tension when she presented to the urology office for follow-up prior to this admission.  This admission, patient was found to have E. coli and Klebsiella UTI as well as E. coli bacteremia.   Cardiology consultedon this admission and recommended outpatient follow-up and signed off. Patient with significant orthostasis. Therapy recommended SNF.  Assessment & Plan:   Active Problems:   Urinary tract infection associated with indwelling urethral catheter (HCC)   Sepsis (HCC)   Severe sepsis due to catheter associated ecoli UTI and bacteremia, and Klebsiella UTI: POA. evidence of endorgan damage including confusion, syncope, hypotension and lactic acidosis on admission. -Sepsis physiology resolved. -Completed 7 days of antibiotic course with ceftriaxone and Omnicef on 4/30.  Recurrent syncope/orthostatic hypotension-continues with significant orthostasis.  Blood pressure dropped from 1 62-1 05 from laying down to standing up. TSH and cortisol within normal. -Orthostatic vitals every shift. -Cardiology recommending outpatient follow-up. -Apply TED hose-unfortunately not helping due to significant muscle mass/subcu fat loss. -Echocardiogram ejection fraction 60 to 65%.  No wall motion abnormalities.  Mild LVH.   Trivial pericardial effusion localized near the right atrium. Calcified mitral valves.  No evidence of mitral valve stenosis.  Tricuspid aortic valve. -Carotid ultrasound no significant stenosis seen. TED hose already ordered.   Abdominal binder ordered  Midodrine 5 mg 3 times daily  advised her to get up from bed very slowly.   Hypertension: On lisinopril at home.  -Hold home lisinopril. -As needed hydralazine for standing SBP above 160 or standing DBP above 100 -Requested RN to chart standing blood pressure.  Urethra obstruction-with indwelling Foley catheter inserted by urology. -Likely discharge home with indwelling Foley catheter for outpatient urology follow-up.  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 -Continue holding home lisinopril -Avoid nephrotoxic meds -Continue monitoring  Hypokalemia: Potassium 3.4 with magnesium 1.8 repleted.  Shortened PR-Interval -Outpatient follow-up with cardiology.  History of Breast cancer History of Breast cancer -continue home Femara  Cognitive impairment/dementia without behavioral disturbance:She is oriented x4 except to date todaybutdoes not seem to have great insight. -Frequent orientation and delirium precautions  Debility/physical deconditioning-uses walker at baseline. -PT/OT-recommended SNF.  Goal of care/DNR/DNI DVT prophylaxis:Subcu heparin Code Status:DNR/DNI Family Communication: Discussed with patient status is: Inpatient  Remains inpatient appropriate because:Hemodynamically unstablewith significant orthostasis   Dispo: The patient is from: Home Anticipated d/c is to: SNF Anticipated d/c date is: > 3 days Patient currentlyis not medically stable to d/c.due to significant orthostatic hypotension Consultants: Cardiology  Procedures:none  Antimicrobials:  Anti-infectives (From admission, onward)   Start     Dose/Rate Route  Frequency Ordered Stop   07/05/19 2200  cefdinir (OMNICEF) capsule 300 mg  Status:  Discontinued     300 mg Oral Every 12 hours 07/05/19 1241 07/06/19 1006   07/04/19 0900  ceFAZolin (ANCEF) IVPB 2g/100 mL premix  Status:  Discontinued  2 g 200 mL/hr over 30 Minutes Intravenous Every 8 hours 07/04/19 0847 07/05/19 1241   07/02/19 1000  cefTRIAXone (ROCEPHIN) 1 g in sodium chloride 0.9 % 100 mL IVPB  Status:  Discontinued     1 g 200 mL/hr over 30 Minutes Intravenous Every 24 hours 07/01/19 1538 07/01/19 1541   07/02/19 0800  cefTRIAXone (ROCEPHIN) 2 g in sodium chloride 0.9 % 100 mL IVPB  Status:  Discontinued     2 g 200 mL/hr over 30 Minutes Intravenous Every 24 hours 07/02/19 0643 07/04/19 0847   07/01/19 1430  aztreonam (AZACTAM) 1 g in sodium chloride 0.9 % 100 mL IVPB  Status:  Discontinued     1 g 200 mL/hr over 30 Minutes Intravenous  Once 07/01/19 1417 07/01/19 1419   07/01/19 1430  cefTRIAXone (ROCEPHIN) 1 g in sodium chloride 0.9 % 100 mL IVPB  Status:  Discontinued     1 g 200 mL/hr over 30 Minutes Intravenous Every 24 hours 07/01/19 1425 07/02/19 0643      Subjective:  Patient continues to be orthostatic and symptomatic started on midodrine last p.m.   Objective: Vitals:   07/07/19 1702 07/07/19 2250 07/07/19 2251 07/08/19 0742  BP:  (!) 87/45 (!) 94/47 131/65  Pulse:  82  73  Resp: 18 18  18   Temp: 97.7 F (36.5 C) 97.8 F (36.6 C)  97.6 F (36.4 C)  TempSrc: Oral Oral    SpO2:  100%  100%  Weight:      Height:       No intake or output data in the 24 hours ending 07/08/19 1041 Filed Weights   07/01/19 1421  Weight: 61.2 kg    Examination:  General exam: Appears calm and comfortable  Respiratory system: Clear to auscultation. Respiratory effort normal. Cardiovascular system: S1 & S2 heard, RRR. No JVD, murmurs, rubs, gallops or clicks. No pedal edema. Gastrointestinal system: Abdomen is nondistended, soft and nontender. No organomegaly or masses  felt. Normal bowel sounds heard. Central nervous system: Alert and oriented. No focal neurological deficits. Extremities: Symmetric 5 x 5 power. Skin: No rashes, lesions or ulcers Psychiatry: Judgement and insight appear normal. Mood & affect appropriate.     Data Reviewed: I have personally reviewed following labs and imaging studies  CBC: Recent Labs  Lab 07/01/19 1254 07/02/19 0538 07/04/19 0137 07/05/19 0328 07/06/19 0259 07/07/19 0236 07/08/19 0415  WBC 9.2   < > 5.0 5.5 7.7 8.0 8.8  NEUTROABS 7.4  --   --   --   --   --   --   HGB 10.4*   < > 10.6* 10.2* 10.0* 10.4* 10.2*  HCT 34.6*   < > 33.9* 31.9* 32.4* 34.0* 33.3*  MCV 88.9   < > 87.6 86.7 88.3 87.9 89.3  PLT 180   < > 269 277 295 321 352   < > = values in this interval not displayed.    Basic Metabolic Panel: Recent Labs  Lab 07/04/19 0137 07/05/19 0328 07/06/19 0259 07/07/19 0236 07/08/19 0415  NA 142 143 142 145 142  K 3.8 3.0* 3.8 3.9 3.4*  CL 107 108 109 109 104  CO2 16* 24 23 25 26   GLUCOSE 106* 102* 97 91 101*  BUN 15 13 15 16 23   CREATININE 1.18* 1.03* 1.24* 0.97 1.17*  CALCIUM 8.9 8.9 9.0 9.4 9.3  MG  --  1.8 1.9 1.9 1.8  PHOS  --  3.4 2.8 3.5 3.9  GFR: Estimated Creatinine Clearance: 33.1 mL/min (A) (by C-G formula based on SCr of 1.17 mg/dL (H)).  Liver Function Tests: Recent Labs  Lab 07/01/19 1254 07/01/19 1254 07/04/19 0137 07/05/19 0328 07/06/19 0259 07/07/19 0236 07/08/19 0415  AST 36  --  25  --   --   --   --   ALT 15  --  18  --   --   --   --   ALKPHOS 50  --  40  --   --   --   --   BILITOT 1.1  --  0.6  --   --   --   --   PROT 6.3*  --  5.8*  --   --   --   --   ALBUMIN 2.8*   < > 2.6* 2.6* 2.8* 2.8* 2.8*   < > = values in this interval not displayed.    CBG: Recent Labs  Lab 07/01/19 1255 07/05/19 2005  GLUCAP 122* 105*     Recent Results (from the past 240 hour(s))  Urine culture     Status: Abnormal   Collection Time: 06/30/19  1:45 PM   Specimen:  Urine, Random  Result Value Ref Range Status   Specimen Description   Final    URINE, RANDOM Performed at [facility], [address] [place], [place] [address]    Special Requests   Final    NONE Performed at [facility], [address] [place], [place] [address]    Culture (A)  Final    >=100,000 COLONIES/mL ESCHERICHIA COLI 80,000 COLONIES/mL KLEBSIELLA PNEUMONIAE    Report Status 07/02/2019 FINAL  Final   Organism ID, Bacteria ESCHERICHIA COLI (A)  Final   Organism ID, Bacteria KLEBSIELLA PNEUMONIAE (A)  Final      Susceptibility   Escherichia coli - MIC*    AMPICILLIN >=32 RESISTANT Resistant     CEFAZOLIN <=4 SENSITIVE Sensitive     CEFTRIAXONE <=1 SENSITIVE Sensitive     CIPROFLOXACIN >=4 RESISTANT Resistant     GENTAMICIN <=1 SENSITIVE Sensitive     IMIPENEM <=0.25 SENSITIVE Sensitive     NITROFURANTOIN <=16 SENSITIVE Sensitive     TRIMETH/SULFA >=320 RESISTANT Resistant     AMPICILLIN/SULBACTAM 8 SENSITIVE Sensitive     PIP/TAZO <=4 SENSITIVE Sensitive     * >=100,000 COLONIES/mL ESCHERICHIA COLI   Klebsiella pneumoniae - MIC*    AMPICILLIN RESISTANT Resistant     CEFAZOLIN <=4 SENSITIVE Sensitive     CEFTRIAXONE <=1 SENSITIVE Sensitive     CIPROFLOXACIN <=0.25 SENSITIVE Sensitive     GENTAMICIN <=1 SENSITIVE Sensitive     IMIPENEM <=0.25 SENSITIVE Sensitive     NITROFURANTOIN 32 SENSITIVE Sensitive     TRIMETH/SULFA <=20 SENSITIVE Sensitive     AMPICILLIN/SULBACTAM 4 SENSITIVE Sensitive     PIP/TAZO <=4 SENSITIVE Sensitive     * 80,000 COLONIES/mL KLEBSIELLA PNEUMONIAE  Urine culture     Status: Abnormal   Collection Time: 07/01/19 12:54 PM   Specimen: Urine, Random  Result Value Ref Range Status   Specimen Description URINE, RANDOM  Final   Special Requests SITE NOT SPECIFIED  Final   Culture (A)  Final    >=100,000 COLONIES/mL ESCHERICHIA COLI >=100,000 COLONIES/mL KLEBSIELLA PNEUMONIAE    Report Status 07/03/2019 FINAL  Final    Organism ID, Bacteria ESCHERICHIA COLI (A)  Final   Organism ID, Bacteria KLEBSIELLA PNEUMONIAE (A)  Final      Susceptibility  Escherichia coli - MIC*    AMPICILLIN >=32 RESISTANT Resistant     CEFAZOLIN <=4 SENSITIVE Sensitive     CEFTRIAXONE <=1 SENSITIVE Sensitive     CIPROFLOXACIN >=4 RESISTANT Resistant     GENTAMICIN <=1 SENSITIVE Sensitive     IMIPENEM <=0.25 SENSITIVE Sensitive     NITROFURANTOIN <=16 SENSITIVE Sensitive     TRIMETH/SULFA >=320 RESISTANT Resistant     AMPICILLIN/SULBACTAM 8 SENSITIVE Sensitive     PIP/TAZO <=4 SENSITIVE Sensitive     * >=100,000 COLONIES/mL ESCHERICHIA COLI   Klebsiella pneumoniae - MIC*    AMPICILLIN RESISTANT Resistant     CEFAZOLIN <=4 SENSITIVE Sensitive     CEFTRIAXONE <=1 SENSITIVE Sensitive     CIPROFLOXACIN <=0.25 SENSITIVE Sensitive     GENTAMICIN <=1 SENSITIVE Sensitive     IMIPENEM <=0.25 SENSITIVE Sensitive     NITROFURANTOIN <=16 SENSITIVE Sensitive     TRIMETH/SULFA <=20 SENSITIVE Sensitive     AMPICILLIN/SULBACTAM 4 SENSITIVE Sensitive     PIP/TAZO <=4 SENSITIVE Sensitive     * >=100,000 COLONIES/mL KLEBSIELLA PNEUMONIAE  Culture, blood (routine x 2)     Status: Abnormal   Collection Time: 07/01/19  2:31 PM   Specimen: BLOOD  Result Value Ref Range Status   Specimen Description BLOOD LEFT ANTECUBITAL  Final   Special Requests   Final    BOTTLES DRAWN AEROBIC AND ANAEROBIC Blood Culture results may not be optimal due to an inadequate volume of blood received in culture bottles   Culture  Setup Time   Final    GRAM NEGATIVE RODS ANAEROBIC BOTTLE ONLY CRITICAL VALUE NOTED.  VALUE IS CONSISTENT WITH PREVIOUSLY REPORTED AND CALLED VALUE.    Culture (A)  Final    ESCHERICHIA COLI SUSCEPTIBILITIES PERFORMED ON PREVIOUS CULTURE WITHIN THE LAST 5 DAYS. Performed at [facility], [place] [address] [place], [place] [address]    Report Status 07/05/2019 FINAL  Final  Culture, blood (routine x 2)     Status: Abnormal    Collection Time: 07/01/19  2:31 PM   Specimen: BLOOD LEFT ARM  Result Value Ref Range Status   Specimen Description BLOOD LEFT ARM  Final   Special Requests   Final    BOTTLES DRAWN AEROBIC AND ANAEROBIC Blood Culture adequate volume   Culture  Setup Time   Final    GRAM NEGATIVE RODS ANAEROBIC BOTTLE ONLY CRITICAL RESULT CALLED TO, READ BACK BY AND VERIFIED WITH: PHARMD G ABBOTT [id number] AT 11 AM BY CM Performed at [facility], [place] [address], [place], [place] [address]    Culture ESCHERICHIA COLI (A)  Final   Report Status 07/05/2019 FINAL  Final   Organism ID, Bacteria ESCHERICHIA COLI  Final      Susceptibility   Escherichia coli - MIC*    AMPICILLIN >=32 RESISTANT Resistant     CEFAZOLIN <=4 SENSITIVE Sensitive     CEFEPIME <=1 SENSITIVE Sensitive     CEFTAZIDIME <=1 SENSITIVE Sensitive     CEFTRIAXONE <=1 SENSITIVE Sensitive     CIPROFLOXACIN >=4 RESISTANT Resistant     GENTAMICIN <=1 SENSITIVE Sensitive     IMIPENEM <=0.25 SENSITIVE Sensitive     TRIMETH/SULFA >=320 RESISTANT Resistant     AMPICILLIN/SULBACTAM 8 SENSITIVE Sensitive     PIP/TAZO <=4 SENSITIVE Sensitive     * ESCHERICHIA COLI  Blood Culture ID Panel (Reflexed)     Status: Abnormal   Collection Time: 07/01/19  2:31 PM  Result Value Ref Range  Status   Enterococcus species NOT DETECTED NOT DETECTED Final   Listeria monocytogenes NOT DETECTED NOT DETECTED Final   Staphylococcus species NOT DETECTED NOT DETECTED Final   Staphylococcus aureus (BCID) NOT DETECTED NOT DETECTED Final   Streptococcus species NOT DETECTED NOT DETECTED Final   Streptococcus agalactiae NOT DETECTED NOT DETECTED Final   Streptococcus pneumoniae NOT DETECTED NOT DETECTED Final   Streptococcus pyogenes NOT DETECTED NOT DETECTED Final   Acinetobacter baumannii NOT DETECTED NOT DETECTED Final   Enterobacteriaceae species DETECTED (A) NOT DETECTED Final    Comment: Enterobacteriaceae represent a large family of gram-negative  bacteria, not a single organism. CRITICAL RESULT CALLED TO, READ BACK BY AND VERIFIED WITH: PHARMD G ABBOTT [id number] AT 39 AM Y CM    Enterobacter cloacae complex NOT DETECTED NOT DETECTED Final   Escherichia coli DETECTED (A) NOT DETECTED Final    Comment: CRITICAL RESULT CALLED TO, READ BACK BY AND VERIFIED WITH: PHARMD G ABBOTT [id number] AT [id number] AM BY CM    Klebsiella oxytoca NOT DETECTED NOT DETECTED Final   Klebsiella pneumoniae NOT DETECTED NOT DETECTED Final   Proteus species NOT DETECTED NOT DETECTED Final   Serratia marcescens NOT DETECTED NOT DETECTED Final   Carbapenem resistance NOT DETECTED NOT DETECTED Final   Haemophilus influenzae NOT DETECTED NOT DETECTED Final   Neisseria meningitidis NOT DETECTED NOT DETECTED Final   Pseudomonas aeruginosa NOT DETECTED NOT DETECTED Final   Candida albicans NOT DETECTED NOT DETECTED Final   Candida glabrata NOT DETECTED NOT DETECTED Final   Candida krusei NOT DETECTED NOT DETECTED Final   Candida parapsilosis NOT DETECTED NOT DETECTED Final   Candida tropicalis NOT DETECTED NOT DETECTED Final    Comment: Performed at [facility], [place] [address], [place], [place] [address]  SARS CORONAVIRUS 2 (TAT 6-24 HRS) Nasopharyngeal Nasopharyngeal Swab     Status: None   Collection Time: 07/01/19  4:44 PM   Specimen: Nasopharyngeal Swab  Result Value Ref Range Status   SARS Coronavirus 2 NEGATIVE NEGATIVE Final    Comment: (NOTE) SARS-CoV-2 target nucleic acids are NOT DETECTED. The SARS-CoV-2 RNA is generally detectable in upper and lower respiratory specimens during the acute phase of infection. Negative results do not preclude SARS-CoV-2 infection, do not rule out co-infections with other pathogens, and should not be used as the sole basis for treatment or other patient management decisions. Negative results must be combined with clinical observations, patient history, and epidemiological information. The expected result is  Negative. Fact Sheet for Patients: SugarRoll.be Fact Sheet for Healthcare Providers: https://www.woods-.com/ This test is not yet approved or cleared by the [place] FDA and  has been authorized for detection and/or diagnosis of SARS-CoV-2 by FDA under an Emergency Use Authorization (EUA). This EUA will remain  in effect (meaning this test can be used) for the duration of the COVID-19 declaration under Section 56 4(b)(1) of the Act, 21 U.S.C. section 360bbb-3(b)(1), unless the authorization is terminated or revoked sooner. Performed at [facility], [place] [address], [place], [place] [address]          Radiology Studies: VAS US CAROTID  Result Date: 07/07/2019 Carotid Arterial Duplex Study Indications: Syncope. Performing Technologist: June Leap RDMS, RVT  Examination Guidelines: A complete evaluation includes B-mode imaging, spectral Doppler, color Doppler, and power Doppler as needed of all accessible portions of each vessel. Bilateral testing is considered an integral part of a complete examination. Limited examinations for reoccurring indications may be performed as noted.  Right Carotid Findings: +----------+--------+--------+--------+-------------------------+--------+           PSV cm/sEDV cm/sStenosisPlaque Description       Comments +----------+--------+--------+--------+-------------------------+--------+ CCA Prox  75      16                                                +----------+--------+--------+--------+-------------------------+--------+ CCA Distal44      10                                                +----------+--------+--------+--------+-------------------------+--------+ ICA Prox  44      16      1-39%   heterogenous and calcific         +----------+--------+--------+--------+-------------------------+--------+ ICA Distal60      24                                                 +----------+--------+--------+--------+-------------------------+--------+ ECA       85      4                                                 +----------+--------+--------+--------+-------------------------+--------+ +----------+--------+-------+----------------+-------------------+           PSV cm/sEDV cmsDescribe        Arm Pressure (mmHG) +----------+--------+-------+----------------+-------------------+ [id number]            Multiphasic, WNL                    +----------+--------+-------+----------------+-------------------+ +---------+--------+--+--------+--+---------+ VertebralPSV cm/s53EDV cm/s16Antegrade +---------+--------+--+--------+--+---------+  Left Carotid Findings: +----------+--------+--------+--------+------------------+--------+           PSV cm/sEDV cm/sStenosisPlaque DescriptionComments +----------+--------+--------+--------+------------------+--------+ CCA Prox  72      18                                         +----------+--------+--------+--------+------------------+--------+ CCA Distal57      11                                         +----------+--------+--------+--------+------------------+--------+ ICA Prox  41      11      1-39%   heterogenous               +----------+--------+--------+--------+------------------+--------+ ICA Distal71      25                                         +----------+--------+--------+--------+------------------+--------+ ECA       58      7                                          +----------+--------+--------+--------+------------------+--------+ +----------+--------+--------+------------+-------------------+  PSV cm/sEDV cm/sDescribe    Arm Pressure (mmHG) +----------+--------+--------+------------+-------------------+ Subclavian                Not assessed                    +----------+--------+--------+------------+-------------------+  +---------+--------+--------+--------------+ VertebralPSV cm/sEDV cm/sNot identified +---------+--------+--------+--------------+   Summary: Right Carotid: Velocities in the right ICA are consistent with a 1-39% stenosis. Left Carotid: Velocities in the left ICA are consistent with a 1-39% stenosis.  *See table(s) above for measurements and observations.  Electronically signed by Monica Martinez MD on 07/07/2019 at 12:51:35 PM.    Final         Scheduled Meds: . calcium-vitamin D  1 tablet Oral QODAY  . Chlorhexidine Gluconate Cloth  6 each Topical Daily  . heparin  5,000 Units Subcutaneous Q8H  . letrozole  2.5 mg Oral Daily  . midodrine  5 mg Oral TID WC  . pantoprazole  40 mg Oral Daily   Continuous Infusions: . lactated ringers       LOS: 7 days     Georgette Shell, MD [facility]   To contact the attending provider between 7A-7P or the covering provider during after hours 7P-7A, please log into the web site [email or web address] and access using universal [place] password for that web site. If you do not have the password, please call the hospital operator.  07/08/2019, 10:41 AM

## 2019-07-09 LAB — CBC
HCT: 33 % — ABNORMAL LOW (ref 36.0–46.0)
Hemoglobin: 9.9 g/dL — ABNORMAL LOW (ref 12.0–15.0)
MCH: 27.2 pg (ref 26.0–34.0)
MCHC: 30 g/dL (ref 30.0–36.0)
MCV: 90.7 fL (ref 80.0–100.0)
Platelets: 362 10*3/uL (ref 150–400)
RBC: 3.64 MIL/uL — ABNORMAL LOW (ref 3.87–5.11)
RDW: 20 % — ABNORMAL HIGH (ref 11.5–15.5)
WBC: 7.5 10*3/uL (ref 4.0–10.5)
nRBC: 0 % (ref 0.0–0.2)

## 2019-07-09 LAB — RENAL FUNCTION PANEL
Albumin: 2.9 g/dL — ABNORMAL LOW (ref 3.5–5.0)
Anion gap: 8 (ref 5–15)
BUN: 24 mg/dL — ABNORMAL HIGH (ref 8–23)
CO2: 26 mmol/L (ref 22–32)
Calcium: 9.1 mg/dL (ref 8.9–10.3)
Chloride: 109 mmol/L (ref 98–111)
Creatinine, Ser: 1.25 mg/dL — ABNORMAL HIGH (ref 0.44–1.00)
GFR calc Af Amer: 47 mL/min — ABNORMAL LOW (ref >60)
GFR calc non Af Amer: 41 mL/min — ABNORMAL LOW (ref >60)
Glucose, Bld: 103 mg/dL — ABNORMAL HIGH (ref 70–99)
Phosphorus: 3.6 mg/dL (ref 2.5–4.6)
Potassium: 4.1 mmol/L (ref 3.5–5.1)
Sodium: 143 mmol/L (ref 135–145)

## 2019-07-09 LAB — MAGNESIUM: Magnesium: 1.9 mg/dL (ref 1.7–2.4)

## 2019-07-09 LAB — SARS CORONAVIRUS 2 (TAT 6-24 HRS): SARS Coronavirus 2: NEGATIVE

## 2019-07-09 MED ORDER — MIDODRINE HCL 5 MG PO TABS
10.0000 mg | ORAL_TABLET | Freq: Three times a day (TID) | ORAL | Status: DC
  Administered 2019-07-09 – 2019-07-12 (×6): 10 mg via ORAL
  Filled 2019-07-09 (×8): qty 2

## 2019-07-09 NOTE — Progress Notes (Signed)
PROGRESS NOTE  THEARY BETTERTON [id number] DOB: 1940/09/04   PCP: Jerrol Banana., MD  Patient is from: home.  Uses walker at baseline.  Lives with son and husband.  DOA: 07/01/2019 LOS: 8  Brief Narrative / Interim history: 79 y.o.femalewith history of breast cancer, CAD, HTN and dementia presenting with recurrent syncopal episode and admitted for syncope and sepsis due to E. coli bacteremia, and E. coli and Klebsiella UTI.  She was treated with ceftriaxone and transitioned to Ancef based on sensitivity.  Patient was previously hospitalized with AKI in the setting of ureteral obstruction requiring Foley catheter about a month ago.  At that time, Foley was discontinued on discharge.  However, she was found to have acute urinary tension when she presented to the urology office for follow-up prior to this admission.  This admission, patient was found to have E. coli and Klebsiella UTI as well as E. coli bacteremia.   Cardiology consulted on this admission and recommended outpatient follow-up and signed off.  Patient with significant orthostasis.  Echo and CUS unrevealing.  Therapy recommended SNF.   Subjective: Seen and examined earlier this morning.  No major events overnight of this morning.  No complaints.  She denies chest pain, dyspnea, nausea, vomiting or abdominal pain.  However, she is not a great historian although she is fairly oriented.  Objective: Vitals:   07/08/19 2337 07/08/19 2339 07/08/19 2341 07/09/19 0738  BP: 125/64 121/76 103/61 (!) 146/66  Pulse: 67 75 78 70  Resp:    18  Temp: 98.1 F (36.7 C) 98.2 F (36.8 C) 97.7 F (36.5 C) 98.3 F (36.8 C)  TempSrc: Oral Oral Oral   SpO2:  100% 100% 98%  Weight:      Height:        Intake/Output Summary (Last 24 hours) at 07/09/2019 1326 Last data filed at 07/09/2019 0600 Gross per 24 hour  Intake -  Output 1250 ml  Net -1250 ml   Filed Weights   07/01/19 1421  Weight: 61.2 kg    Examination:   GENERAL: Frail looking elderly female.  No apparent distress.  Nontoxic. HEENT: MMM.  Vision and hearing grossly intact.  NECK: Supple.  No apparent JVD.  RESP: On room air.  No IWOB.  Fair aeration bilaterally. CVS:  RRR. Heart sounds normal.  ABD/GI/GU: Bowel sounds present. Soft. Non tender.  MSK/EXT:  Moves extremities.  Global muscle mass and subcu fat loss. SKIN: no apparent skin lesion or wound NEURO: Awake and oriented x4 - state but slow.  Follows some commands.  No apparent focal neuro deficit. PSYCH: Calm. Normal affect.  Procedures:  None  Microbiology summarized: 4/25-COVID-19 PCR negative.   4/25-blood culture with E. Coli. 4/25-urine culture with E. coli and Klebsiella-both sensitive to cefazolin.   Assessment & Plan: Severe sepsis due to catheter associated ecoli UTI and bacteremia, and Klebsiella UTI: POA.  evidence of endorgan damage including confusion, syncope, hypotension and lactic acidosis on admission. -Sepsis physiology resolved. -Completed 7 days of antibiotic course with ceftriaxone and Omnicef on 4/30.  Recurrent syncope/orthostatic hypotension-continues to have significant orthostasis.  Could have some autonomic dysregulation.  TSH, a.m. cortisol, echo and CUS unrevealing. -Orthostatic vitals every shift. -Cardiology recommending outpatient follow-up. -Apply TED hose-unfortunately not helping due to significant muscle mass/subcu fat loss. -Increase midodrine to 10 mg 3 times daily.   Hypertension: On lisinopril at home.  Now with significant orthostatic hypotension. -Hold home lisinopril. -As needed hydralazine for standing SBP above 160  or standing DBP above 100 -Increase midodrine to 10 mg 3 times daily.  Urethra obstruction-with indwelling Foley catheter inserted by urology. -Likely discharge home with indwelling Foley catheter for outpatient urology follow-up.  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 -Continue  holding home lisinopril -Avoid nephrotoxic meds -Continue monitoring  Hypokalemia: Resolved.  Shortened PR-Interval -Outpatient follow-up with cardiology.  History of Breast cancer -continue home [employer]  Cognitive impairment/dementia without behavioral disturbance: She is oriented x4 except time but does not seem to have great insight. -Frequent orientation and delirium precautions  Debility/physical deconditioning-uses walker at baseline. -PT/OT-recommended SNF.  Goal of care/DNR/DNI               DVT prophylaxis: Subcu heparin Code Status: DNR/DNI Family Communication: None at bedside.  Status is: Inpatient  Remains inpatient appropriate because:Hemodynamically unstable with significant orthostasis   Dispo: The patient is from: Home              Anticipated d/c is to: SNF              Anticipated d/c date is: 1 day              Patient currently is not medically stable to d/c.due to significant orthostatic hypotension     Consultants:  Cardiology   Sch Meds:  Scheduled Meds: . calcium-vitamin D  1 tablet Oral QODAY  . Chlorhexidine Gluconate Cloth  6 each Topical Daily  . heparin  5,000 Units Subcutaneous Q8H  . letrozole  2.5 mg Oral Daily  . midodrine  10 mg Oral TID WC  . pantoprazole  40 mg Oral Daily   Continuous Infusions: . lactated ringers     PRN Meds:.acetaminophen, hydrALAZINE, senna-docusate  Antimicrobials: Anti-infectives (From admission, onward)   Start     Dose/Rate Route Frequency Ordered Stop   07/05/19 2200  cefdinir (OMNICEF) capsule 300 mg  Status:  Discontinued     300 mg Oral Every 12 hours 07/05/19 1241 07/06/19 1006   07/04/19 0900  ceFAZolin (ANCEF) IVPB 2g/100 mL premix  Status:  Discontinued     2 g 200 mL/hr over 30 Minutes Intravenous Every 8 hours 07/04/19 0847 07/05/19 1241   07/02/19 1000  cefTRIAXone (ROCEPHIN) 1 g in sodium chloride 0.9 % 100 mL IVPB  Status:  Discontinued     1 g 200 mL/hr over 30 Minutes  Intravenous Every 24 hours 07/01/19 1538 07/01/19 1541   07/02/19 0800  cefTRIAXone (ROCEPHIN) 2 g in sodium chloride 0.9 % 100 mL IVPB  Status:  Discontinued     2 g 200 mL/hr over 30 Minutes Intravenous Every 24 hours 07/02/19 0643 07/04/19 0847   07/01/19 1430  aztreonam (AZACTAM) 1 g in sodium chloride 0.9 % 100 mL IVPB  Status:  Discontinued     1 g 200 mL/hr over 30 Minutes Intravenous  Once 07/01/19 1417 07/01/19 1419   07/01/19 1430  cefTRIAXone (ROCEPHIN) 1 g in sodium chloride 0.9 % 100 mL IVPB  Status:  Discontinued     1 g 200 mL/hr over 30 Minutes Intravenous Every 24 hours 07/01/19 1425 07/02/19 0643       I have personally reviewed the following labs and images: CBC: Recent Labs  Lab 07/05/19 0328 07/06/19 0259 07/07/19 0236 07/08/19 0415 07/09/19 0339  WBC 5.5 7.7 8.0 8.8 7.5  HGB 10.2* 10.0* 10.4* 10.2* 9.9*  HCT 31.9* 32.4* 34.0* 33.3* 33.0*  MCV 86.7 88.3 87.9 89.3 90.7  PLT 277 295 321  352 362   BMP &GFR Recent Labs  Lab 07/05/19 0328 07/06/19 0259 07/07/19 0236 07/08/19 0415 07/09/19 0339  NA 143 142 145 142 143  K 3.0* 3.8 3.9 3.4* 4.1  CL 108 109 109 104 109  CO2 24 23 25 26 26   GLUCOSE 102* 97 91 101* 103*  BUN 13 15 16 23  24*  CREATININE 1.03* 1.24* 0.97 1.17* 1.25*  CALCIUM 8.9 9.0 9.4 9.3 9.1  MG 1.8 1.9 1.9 1.8 1.9  PHOS 3.4 2.8 3.5 3.9 3.6   Estimated Creatinine Clearance: 31 mL/min (A) (by C-G formula based on SCr of 1.25 mg/dL (H)). Liver & Pancreas: Recent Labs  Lab 07/04/19 0137 07/04/19 0137 07/05/19 0328 07/06/19 0259 07/07/19 0236 07/08/19 0415 07/09/19 0339  AST 25  --   --   --   --   --   --   ALT 18  --   --   --   --   --   --   ALKPHOS 40  --   --   --   --   --   --   BILITOT 0.6  --   --   --   --   --   --   PROT 5.8*  --   --   --   --   --   --   ALBUMIN 2.6*   < > 2.6* 2.8* 2.8* 2.8* 2.9*   < > = values in this interval not displayed.   No results for input(s): LIPASE, AMYLASE in the last 168 hours. No  results for input(s): AMMONIA in the last 168 hours. Diabetic: No results for input(s): HGBA1C in the last 72 hours. Recent Labs  Lab 07/05/19 2005  GLUCAP 105*   Cardiac Enzymes: No results for input(s): CKTOTAL, CKMB, CKMBINDEX, TROPONINI in the last 168 hours. No results for input(s): PROBNP in the last 8760 hours. Coagulation Profile: No results for input(s): INR, PROTIME in the last 168 hours. Thyroid Function Tests: No results for input(s): TSH, T4TOTAL, FREET4, T3FREE, THYROIDAB in the last 72 hours. Lipid Profile: No results for input(s): CHOL, HDL, LDLCALC, TRIG, CHOLHDL, LDLDIRECT in the last 72 hours. Anemia Panel: No results for input(s): VITAMINB12, FOLATE, FERRITIN, TIBC, IRON, RETICCTPCT in the last 72 hours. Urine analysis:    Component Value Date/Time   COLORURINE AMBER (A) 07/01/2019 1254   APPEARANCEUR CLOUDY (A) 07/01/2019 1254   APPEARANCEUR Cloudy (A) 04/25/2015 1449   LABSPEC 1.011 07/01/2019 1254   LABSPEC 1.021 01/17/2014 1524   PHURINE 6.0 07/01/2019 1254   GLUCOSEU NEGATIVE 07/01/2019 1254   GLUCOSEU Negative 01/17/2014 1524   HGBUR SMALL (A) 07/01/2019 1254   BILIRUBINUR NEGATIVE 07/01/2019 1254   BILIRUBINUR neg 06/30/2016 1619   BILIRUBINUR Negative 04/25/2015 1449   BILIRUBINUR Negative 01/17/2014 1524   KETONESUR NEGATIVE 07/01/2019 1254   PROTEINUR 100 (A) 07/01/2019 1254   UROBILINOGEN 0.2 06/30/2016 1619   NITRITE NEGATIVE 07/01/2019 1254   LEUKOCYTESUR LARGE (A) 07/01/2019 1254   LEUKOCYTESUR 2+ 01/17/2014 1524   Sepsis Labs: Invalid input(s): PROCALCITONIN, [place]  Microbiology: Recent Results (from the past 240 hour(s))  Urine culture     Status: Abnormal   Collection Time: 06/30/19  1:45 PM   Specimen: Urine, Random  Result Value Ref Range Status   Specimen Description   Final    URINE, RANDOM Performed at [facility], [address], [place], [place] [address]    Special Requests   Final  NONE Performed  at [facility], [address] [place], [place] [address]    Culture (A)  Final    >=100,000 COLONIES/mL ESCHERICHIA COLI 80,000 COLONIES/mL KLEBSIELLA PNEUMONIAE    Report Status 07/02/2019 FINAL  Final   Organism ID, Bacteria ESCHERICHIA COLI (A)  Final   Organism ID, Bacteria KLEBSIELLA PNEUMONIAE (A)  Final      Susceptibility   Escherichia coli - MIC*    AMPICILLIN >=32 RESISTANT Resistant     CEFAZOLIN <=4 SENSITIVE Sensitive     CEFTRIAXONE <=1 SENSITIVE Sensitive     CIPROFLOXACIN >=4 RESISTANT Resistant     GENTAMICIN <=1 SENSITIVE Sensitive     IMIPENEM <=0.25 SENSITIVE Sensitive     NITROFURANTOIN <=16 SENSITIVE Sensitive     TRIMETH/SULFA >=320 RESISTANT Resistant     AMPICILLIN/SULBACTAM 8 SENSITIVE Sensitive     PIP/TAZO <=4 SENSITIVE Sensitive     * >=100,000 COLONIES/mL ESCHERICHIA COLI   Klebsiella pneumoniae - MIC*    AMPICILLIN RESISTANT Resistant     CEFAZOLIN <=4 SENSITIVE Sensitive     CEFTRIAXONE <=1 SENSITIVE Sensitive     CIPROFLOXACIN <=0.25 SENSITIVE Sensitive     GENTAMICIN <=1 SENSITIVE Sensitive     IMIPENEM <=0.25 SENSITIVE Sensitive     NITROFURANTOIN 32 SENSITIVE Sensitive     TRIMETH/SULFA <=20 SENSITIVE Sensitive     AMPICILLIN/SULBACTAM 4 SENSITIVE Sensitive     PIP/TAZO <=4 SENSITIVE Sensitive     * 80,000 COLONIES/mL KLEBSIELLA PNEUMONIAE  Urine culture     Status: Abnormal   Collection Time: 07/01/19 12:54 PM   Specimen: Urine, Random  Result Value Ref Range Status   Specimen Description URINE, RANDOM  Final   Special Requests SITE NOT SPECIFIED  Final   Culture (A)  Final    >=100,000 COLONIES/mL ESCHERICHIA COLI >=100,000 COLONIES/mL KLEBSIELLA PNEUMONIAE    Report Status 07/03/2019 FINAL  Final   Organism ID, Bacteria ESCHERICHIA COLI (A)  Final   Organism ID, Bacteria KLEBSIELLA PNEUMONIAE (A)  Final      Susceptibility   Escherichia coli - MIC*    AMPICILLIN >=32 RESISTANT Resistant     CEFAZOLIN <=4 SENSITIVE  Sensitive     CEFTRIAXONE <=1 SENSITIVE Sensitive     CIPROFLOXACIN >=4 RESISTANT Resistant     GENTAMICIN <=1 SENSITIVE Sensitive     IMIPENEM <=0.25 SENSITIVE Sensitive     NITROFURANTOIN <=16 SENSITIVE Sensitive     TRIMETH/SULFA >=320 RESISTANT Resistant     AMPICILLIN/SULBACTAM 8 SENSITIVE Sensitive     PIP/TAZO <=4 SENSITIVE Sensitive     * >=100,000 COLONIES/mL ESCHERICHIA COLI   Klebsiella pneumoniae - MIC*    AMPICILLIN RESISTANT Resistant     CEFAZOLIN <=4 SENSITIVE Sensitive     CEFTRIAXONE <=1 SENSITIVE Sensitive     CIPROFLOXACIN <=0.25 SENSITIVE Sensitive     GENTAMICIN <=1 SENSITIVE Sensitive     IMIPENEM <=0.25 SENSITIVE Sensitive     NITROFURANTOIN <=16 SENSITIVE Sensitive     TRIMETH/SULFA <=20 SENSITIVE Sensitive     AMPICILLIN/SULBACTAM 4 SENSITIVE Sensitive     PIP/TAZO <=4 SENSITIVE Sensitive     * >=100,000 COLONIES/mL KLEBSIELLA PNEUMONIAE  Culture, blood (routine x 2)     Status: Abnormal   Collection Time: 07/01/19  2:31 PM   Specimen: BLOOD  Result Value Ref Range Status   Specimen Description BLOOD LEFT ANTECUBITAL  Final   Special Requests   Final    BOTTLES DRAWN AEROBIC AND ANAEROBIC Blood Culture results may not be optimal due  to an inadequate volume of blood received in culture bottles   Culture  Setup Time   Final    GRAM NEGATIVE RODS ANAEROBIC BOTTLE ONLY CRITICAL VALUE NOTED.  VALUE IS CONSISTENT WITH PREVIOUSLY REPORTED AND CALLED VALUE.    Culture (A)  Final    ESCHERICHIA COLI SUSCEPTIBILITIES PERFORMED ON PREVIOUS CULTURE WITHIN THE LAST 5 DAYS. Performed at [facility], [place] [address] [place], [place] [address]    Report Status 07/05/2019 FINAL  Final  Culture, blood (routine x 2)     Status: Abnormal   Collection Time: 07/01/19  2:31 PM   Specimen: BLOOD LEFT ARM  Result Value Ref Range Status   Specimen Description BLOOD LEFT ARM  Final   Special Requests   Final    BOTTLES DRAWN AEROBIC AND ANAEROBIC Blood Culture  adequate volume   Culture  Setup Time   Final    GRAM NEGATIVE RODS ANAEROBIC BOTTLE ONLY CRITICAL RESULT CALLED TO, READ BACK BY AND VERIFIED WITH: PHARMD G ABBOTT JQ:[id number] AT 41 AM BY CM Performed at [facility], [place] [address], [place], [place] [address]    Culture ESCHERICHIA COLI (A)  Final   Report Status 07/05/2019 FINAL  Final   Organism ID, Bacteria ESCHERICHIA COLI  Final      Susceptibility   Escherichia coli - MIC*    AMPICILLIN >=32 RESISTANT Resistant     CEFAZOLIN <=4 SENSITIVE Sensitive     CEFEPIME <=1 SENSITIVE Sensitive     CEFTAZIDIME <=1 SENSITIVE Sensitive     CEFTRIAXONE <=1 SENSITIVE Sensitive     CIPROFLOXACIN >=4 RESISTANT Resistant     GENTAMICIN <=1 SENSITIVE Sensitive     IMIPENEM <=0.25 SENSITIVE Sensitive     TRIMETH/SULFA >=320 RESISTANT Resistant     AMPICILLIN/SULBACTAM 8 SENSITIVE Sensitive     PIP/TAZO <=4 SENSITIVE Sensitive     * ESCHERICHIA COLI  Blood Culture ID Panel (Reflexed)     Status: Abnormal   Collection Time: 07/01/19  2:31 PM  Result Value Ref Range Status   Enterococcus species NOT DETECTED NOT DETECTED Final   Listeria monocytogenes NOT DETECTED NOT DETECTED Final   Staphylococcus species NOT DETECTED NOT DETECTED Final   Staphylococcus aureus (BCID) NOT DETECTED NOT DETECTED Final   Streptococcus species NOT DETECTED NOT DETECTED Final   Streptococcus agalactiae NOT DETECTED NOT DETECTED Final   Streptococcus pneumoniae NOT DETECTED NOT DETECTED Final   Streptococcus pyogenes NOT DETECTED NOT DETECTED Final   Acinetobacter baumannii NOT DETECTED NOT DETECTED Final   Enterobacteriaceae species DETECTED (A) NOT DETECTED Final    Comment: Enterobacteriaceae represent a large family of gram-negative bacteria, not a single organism. CRITICAL RESULT CALLED TO, READ BACK BY AND VERIFIED WITH: PHARMD G ABBOTT JQ:[id number] AT 80 AM Y CM    Enterobacter cloacae complex NOT DETECTED NOT DETECTED Final   Escherichia coli DETECTED (A)  NOT DETECTED Final    Comment: CRITICAL RESULT CALLED TO, READ BACK BY AND VERIFIED WITH: PHARMD G ABBOTT JQ:[id number] AT [id number] AM BY CM    Klebsiella oxytoca NOT DETECTED NOT DETECTED Final   Klebsiella pneumoniae NOT DETECTED NOT DETECTED Final   Proteus species NOT DETECTED NOT DETECTED Final   Serratia marcescens NOT DETECTED NOT DETECTED Final   Carbapenem resistance NOT DETECTED NOT DETECTED Final   Haemophilus influenzae NOT DETECTED NOT DETECTED Final   Neisseria meningitidis NOT DETECTED NOT DETECTED Final   Pseudomonas aeruginosa NOT DETECTED NOT DETECTED Final   Candida  albicans NOT DETECTED NOT DETECTED Final   Candida glabrata NOT DETECTED NOT DETECTED Final   Candida krusei NOT DETECTED NOT DETECTED Final   Candida parapsilosis NOT DETECTED NOT DETECTED Final   Candida tropicalis NOT DETECTED NOT DETECTED Final    Comment: Performed at [facility], [place] [address] [place], [place] [address]  SARS CORONAVIRUS 2 (TAT 6-24 HRS) Nasopharyngeal Nasopharyngeal Swab     Status: None   Collection Time: 07/01/19  4:44 PM   Specimen: Nasopharyngeal Swab  Result Value Ref Range Status   SARS Coronavirus 2 NEGATIVE NEGATIVE Final    Comment: (NOTE) SARS-CoV-2 target nucleic acids are NOT DETECTED. The SARS-CoV-2 RNA is generally detectable in upper and lower respiratory specimens during the acute phase of infection. Negative results do not preclude SARS-CoV-2 infection, do not rule out co-infections with other pathogens, and should not be used as the sole basis for treatment or other patient management decisions. Negative results must be combined with clinical observations, patient history, and epidemiological information. The expected result is Negative. Fact Sheet for Patients: SugarRoll.be Fact Sheet for Healthcare Providers: https://www.woods-mathews.com/ This test is not yet approved or cleared by the [place] FDA and  has been  authorized for detection and/or diagnosis of SARS-CoV-2 by FDA under an Emergency Use Authorization (EUA). This EUA will remain  in effect (meaning this test can be used) for the duration of the COVID-19 declaration under Section 56 4(b)(1) of the Act, 21 U.S.C. section 360bbb-3(b)(1), unless the authorization is terminated or revoked sooner. Performed at [facility], [place] [address] [place], [place] [address]     Radiology Studies: No results found.    Henryetta Corriveau T. [place]  If 7PM-7AM, please contact night-coverage [email or web address] Password [email or web address] 07/09/2019, 1:26 PM

## 2019-07-09 NOTE — TOC Progression Note (Addendum)
Transition of Care [facility]) - Progression Note    Patient Details  Name: [name] [name] MRN: [id number] Date of Birth: 1940/05/31  Transition of Care [facility]) CM/SW [place], [place] Phone Number: 07/09/2019, 1:24 PM  Clinical Narrative:    CSW made telephone call to patient's spouse [name] [name] to follow-up on SNF decision. Spouse expressed preference for [facility] [place]. He expressed no further questions at this time.   Insurance authorization started. Insurance requested they need another PT note. CSW contacted PT services, requesting an updated evaluation. PT services noted patient for a Tuesday evaluation. TOC team will continue to follow.    Expected Discharge Plan: [place] Barriers to Discharge: Continued Medical Work up  Expected Discharge Plan and Services Expected Discharge Plan: [place] arrangements for the past 2 months: Single Family Home                                       Social Determinants of Health (SDOH) Interventions    Readmission Risk Interventions Readmission Risk Prevention Plan 07/04/2019  Transportation Screening Complete  PCP or Specialist Appt within 3-5 Days Complete  HRI or Home Care Consult Complete  Medication Review (RN Care Manager) Referral to Pharmacy  Some recent data might be hidden

## 2019-07-09 NOTE — TOC Progression Note (Signed)
Transition of Care [facility]) - Progression Note    Patient Details  Name: [name] [name] MRN: [id number] Date of Birth: 11-26-1940  Transition of Care [facility]) CM/SW [place], [place] Phone Number: 07/09/2019, 9:18 AM  Clinical Narrative:    CSW made telephone call to patient's spouse to follow-up on bed offer. Awaiting a callback.   Expected Discharge Plan: [place] Barriers to Discharge: Continued Medical Work up  Expected Discharge Plan and Services Expected Discharge Plan: [place] arrangements for the past 2 months: Single Family Home                                       Social Determinants of Health (SDOH) Interventions    Readmission Risk Interventions Readmission Risk Prevention Plan 07/04/2019  Transportation Screening Complete  PCP or Specialist Appt within 3-5 Days Complete  HRI or Home Care Consult Complete  Medication Review (RN Care Manager) Referral to Pharmacy  Some recent data might be hidden

## 2019-07-10 DIAGNOSIS — D65 Disseminated intravascular coagulation [defibrination syndrome]: Secondary | ICD-10-CM

## 2019-07-10 DIAGNOSIS — E43 Unspecified severe protein-calorie malnutrition: Secondary | ICD-10-CM

## 2019-07-10 LAB — RENAL FUNCTION PANEL
Albumin: 3 g/dL — ABNORMAL LOW (ref 3.5–5.0)
Anion gap: 7 (ref 5–15)
BUN: 21 mg/dL (ref 8–23)
CO2: 25 mmol/L (ref 22–32)
Calcium: 9.4 mg/dL (ref 8.9–10.3)
Chloride: 110 mmol/L (ref 98–111)
Creatinine, Ser: 1.27 mg/dL — ABNORMAL HIGH (ref 0.44–1.00)
GFR calc Af Amer: 46 mL/min — ABNORMAL LOW (ref >60)
GFR calc non Af Amer: 40 mL/min — ABNORMAL LOW (ref >60)
Glucose, Bld: 102 mg/dL — ABNORMAL HIGH (ref 70–99)
Phosphorus: 3.6 mg/dL (ref 2.5–4.6)
Potassium: 3.6 mmol/L (ref 3.5–5.1)
Sodium: 142 mmol/L (ref 135–145)

## 2019-07-10 LAB — HEMOGLOBIN AND HEMATOCRIT, BLOOD
HCT: 33 % — ABNORMAL LOW (ref 36.0–46.0)
Hemoglobin: 10.3 g/dL — ABNORMAL LOW (ref 12.0–15.0)

## 2019-07-10 LAB — MAGNESIUM: Magnesium: 1.8 mg/dL (ref 1.7–2.4)

## 2019-07-10 MED ORDER — HYDRALAZINE HCL 25 MG PO TABS: 25.0000 mg | ORAL_TABLET | Freq: Two times a day (BID) | ORAL | 1 refills | Status: DC | PRN

## 2019-07-10 MED ORDER — MIDODRINE HCL 10 MG PO TABS: 10.0000 mg | ORAL_TABLET | Freq: Three times a day (TID) | ORAL | 1 refills | Status: DC

## 2019-07-10 MED ORDER — HALOPERIDOL LACTATE 5 MG/ML IJ SOLN
1.0000 mg | Freq: Four times a day (QID) | INTRAMUSCULAR | Status: DC | PRN
  Administered 2019-07-10 – 2019-07-12 (×5): 1 mg via INTRAVENOUS
  Filled 2019-07-10 (×5): qty 1

## 2019-07-10 NOTE — Progress Notes (Signed)
Occupational Therapy Treatment Patient Details Name: [name] [name] MRN: [id number] DOB: [birth year] Today's Date: 07/10/2019    History of present illness 79 yo female admitted to ED on 4/24 for weakness, lightheadedness, fatigue, and syncope; indwelling catheter placed PTA by urology for urinary retention. Pt with diagnosis of sepsis, suspect secondary to UTI. Pt with recent ED visit 4/21-4/22 for syncope. 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   OT comments  Patient continues to make steady progress towards goals in skilled OT session. Patient's session encompassed co-treat with Pt in order to address functional deficits and increase activity tolerance. Pt alert and oriented x4, however with significant cognitive deficits when completing ADL tasks. Pt with need for multi-modal cues in order to sequence basic ADLs, and then required hand over hand assist in order to complete sequencing with the walker and lateral stepping at sink x4. Pt unable to complete 2 step commands without significant assist, however can follow one step commands inconsistently with increased time. Discharge plan remains appropriate; will continue to follow acutely.    Follow Up Recommendations  Supervision/Assistance - 24 hour;SNF    Equipment Recommendations       Recommendations for Other Services      Precautions / Restrictions Precautions Precautions: Fall Precaution Comments: orthostatic Restrictions Weight Bearing Restrictions: No       Mobility Bed Mobility Overal bed mobility: Needs Assistance Bed Mobility: Supine to Sit;Sit to Supine     Supine to sit: Min guard Sit to supine: Min guard   General bed mobility comments: Min gaurd for bed mobility, however requires significant increased commands to sequence  Transfers Overall transfer level: Needs assistance Equipment used: Rolling walker (2  wheeled) Transfers: Sit to/from Stand Sit to Stand: Min assist         General transfer comment: Min A to power up, however requires hand over hand assist and significant increased time to sequence steps, lateral stepping completed x4 at sink, required visual cues of OTAs feet to complete each time despite repetitions    Balance Overall balance assessment: Needs assistance Sitting-balance support: Feet supported Sitting balance-Leahy Scale: Good     Standing balance support: Bilateral upper extremity supported;During functional activity Standing balance-Leahy Scale: Poor Standing balance comment: reliant on external support, hand over hand assist each time to place hands on walker when completing ADLs at sink and lateral stepping                           ADL either performed or assessed with clinical judgement   ADL Overall ADL's : Needs assistance/impaired     Grooming: Wash/dry hands;Wash/dry face;Oral care;Brushing hair;Standing;Cueing for safety;Cueing for sequencing Grooming Details (indicate cue type and reason): Standing at sink, cues to reorient and thoroughness     Lower Body Bathing: Sitting/lateral leans Lower Body Bathing Details (indicate cue type and reason): Sitting EOB to adjust socks         Toilet Transfer: Minimal assistance Toilet Transfer Details (indicate cue type and reason): Min A to stedy, simulated with sit<>stand from bed         Functional mobility during ADLs: Minimal assistance;Cueing for safety;Cueing for sequencing;Rolling walker General ADL Comments: Min A with RW, however pt's confusion grew exponentially worse as session progressed despite being alert and oriented x4     Vision       Perception  Praxis      Cognition Arousal/Alertness: Awake/alert Behavior During Therapy: WFL for tasks assessed/performed Overall Cognitive Status: No family/caregiver present to determine baseline cognitive functioning(Family  present, but was uanble to stay awake for session) Area of Impairment: Memory;Safety/judgement;Awareness;Problem solving;Attention;Following commands                   Current Attention Level: Sustained;Focused Memory: Decreased short-term memory Following Commands: Follows one step commands with increased time;Follows one step commands inconsistently Safety/Judgement: Decreased awareness of safety;Decreased awareness of deficits Awareness: Emergent Problem Solving: Slow processing;Decreased initiation;Requires verbal cues;Requires tactile cues;Difficulty sequencing General Comments: Pt oriented, however required max multi-modal cues in order to sequence ADLs, then at one point looked at wall stating "Hi, Ill be with you in a minute" stating a family friend was in the room in addition to her son (son sleeping on opposite side of room)        Exercises     Shoulder Instructions       General Comments      Pertinent Vitals/ Pain       Pain Assessment: No/denies pain Faces Pain Scale: No hurt  Home Living                                          Prior Functioning/Environment              Frequency  Min 2X/week        Progress Toward Goals  OT Goals(current goals can now be found in the care plan section)  Progress towards OT goals: Progressing toward goals  Acute Rehab OT Goals Patient Stated Goal: feel better OT Goal Formulation: With patient Time For Goal Achievement: 07/19/19 Potential to Achieve Goals: Good  Plan Discharge plan remains appropriate;Frequency remains appropriate    Co-evaluation                 AM-PAC OT "6 Clicks" Daily Activity     Outcome Measure   Help from another person eating meals?: A Little Help from another person taking care of personal grooming?: A Little Help from another person toileting, which includes using toliet, bedpan, or urinal?: A Little Help from another person bathing (including  washing, rinsing, drying)?: A Little Help from another person to put on and taking off regular upper body clothing?: A Little Help from another person to put on and taking off regular lower body clothing?: A Little 6 Click Score: 18    End of Session Equipment Utilized During Treatment: Gait belt;Rolling walker  OT Visit Diagnosis: Other abnormalities of gait and mobility (R26.89);Muscle weakness (generalized) (M62.81);Other symptoms and signs involving cognitive function;Dizziness and giddiness (R42)   Activity Tolerance Patient tolerated treatment well   Patient Left in bed;with call bell/phone within reach;with bed alarm set(Pt unable to sit up in chair due to son sleeping in recliner)   Nurse Communication Mobility status        Time: [id number] OT Time Calculation (min): 26 min  Charges: OT General Charges $OT Visit: 1 Visit OT Treatments $Self Care/Home Management : 8-22 mins  Corinne Ports E. Karema Tocci, COTA/L Acute Rehabilitation Services [place] 07/10/2019, 1:23 PM

## 2019-07-10 NOTE — Discharge Summary (Signed)
Physician Discharge Summary  [name] [name] [id number] DOB: Feb 14, 1941 DOA: 07/01/2019  PCP: Jerrol Banana., MD  Admit date: 07/01/2019 Discharge date: 07/10/2019  Admitted From: Home Disposition: SNF  Recommendations for Outpatient Follow-up:  1. Follow ups as below. 2. Orthostatic vitals twice a day 3. Fall and delirium precautions 4. Outpatient follow-up with urology for voiding trial in 1 week 5. Please obtain CBC/BMP/Mag in 1 week 6. Please follow up on the following pending results: None   Discharge Condition: Stable CODE STATUS: DNR/DNI  Contact information for after-discharge care    Destination    [facility]erred [facility] .   Service: Skilled Nursing Contact information: [address] [place] [facility] Course: 79 y.o.femalewith history of breast cancer, CAD, HTN and dementia presenting with recurrent syncopal episode and admitted for syncope and sepsis due to E. coli bacteremia, and E. coli and Klebsiella UTI.  She was treated with ceftriaxone and transitioned to Ancef based on sensitivity.  Patient was previously hospitalized with AKI in the setting of ureteral obstruction requiring Foley catheter about a month ago. At that time, Foley was discontinued on discharge.  However, she was found to have acute urinary retention when she presented to the urology office for follow-up prior to this admission.  Recommend follow-up with urology in 1 week after discharge.  This admission, patient was found to have E. coli and Klebsiella UTI as well as E. coli bacteremia.  She completed 7 days of antibiotic course with ceftriaxone and Omnicef on 4/30.   Cardiology consulted on  admission and recommended outpatient follow-up and signed off.  Patient noted to have significant orthostatic hypotension.  Echo and CUS unrevealing.   Started on midodrine but continued to have orthostatic  hypotension likely due to autonomic dysregulation. Therapy recommended SNF.   She will be discharged on midodrine.  Home lisinopril discontinued.  As needed hydralazine with parameters based on standing blood pressure.  Liberated diet to regular.  Recommend frequent reorientation, delirium and fall precautions.  See individual problem list below for more on hospital course.  Discharge Diagnoses:  Severe sepsis due to catheter associated ecoli UTI and bacteremia, and Klebsiella UTI: POA.  evidence of endorgan damage including confusion, syncope, hypotension and lactic acidosis on admission. Sepsis physiology resolved. -Completed 7 days of antibiotic course with ceftriaxone and Omnicef on 4/30.  Recurrent syncope/orthostatic hypotension-continues to have significant orthostasis.  Could have some autonomic dysregulation.  TSH, a.m. cortisol, echo and carotid ultrasound unrevealing. -Orthostatic vitals every shift. -Outpatient follow-up with cardiology -Continue TED hose-although this might not help much with significant muscle mass loss -Continue midodrine 10 mg 3 times daily  Hypertension: On lisinopril at home.  Now with significant orthostatic hypotension. -Discontinued lisinopril. -As needed hydralazine for standing SBP above 160 or standing DBP above 100 -Midodrine as above.  Urethra obstruction-with indwelling Foley catheter inserted by urology. -Discharged home with indwelling Foley catheter for outpatient urology follow-up in 1 week.  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 -Avoid nephrotoxic meds -Ensure good hydration. -Check renal panel in 1 week  Hypokalemia: Resolved.  Shortened PR-Interval -Outpatient follow-up with cardiology.  History of Breast cancer -continue home [employer]  Cognitive impairment/dementia without behavioral disturbance: She is oriented x4 except time but does not seem to have great insight. -Frequent orientation and delirium  precautions  Debility/physical deconditioning-uses walker at baseline. -PT/OT-recommended SNF.  Goal of care/DNR/DNI                 Discharge Exam: Vitals:   07/10/19 0741 07/10/19 0829  BP:  133/63  Pulse:  (!) 58  Resp:  18  Temp: 97.6 F (36.4 C) (!) 97.3 F (36.3 C)  SpO2: 100% 100%    GENERAL: No apparent distress.  Nontoxic. HEENT: MMM.  Vision and hearing grossly intact.  NECK: Supple.  No apparent JVD.  RESP: On room air.  No IWOB.  Fair aeration bilaterally. CVS:  RRR. Heart sounds normal.  ABD/GI/GU: Bowel sounds present. Soft. Non tender.  MSK/EXT:  Moves extremities.  Significant muscle mass and subcu fat loss. SKIN: no apparent skin lesion or wound NEURO: Awake, alert and oriented x4 except to date but poor insight.  No apparent focal neuro deficit. PSYCH: Calm.  Appropriate affect.  Discharge Instructions  Discharge Instructions    Diet general   Complete by: As directed    Increase activity slowly   Complete by: As directed      Allergies as of 07/10/2019      Reactions   Celebrex  [celecoxib]    Dark stools.   Penicillins Other (See Comments)   Childhood reaction Has patient had a PCN reaction causing immediate rash, facial/tongue/throat swelling, SOB or lightheadedness with hypotension: Unknown Has patient had a PCN reaction causing severe rash involving mucus membranes or skin necrosis: Unknown Has patient had a PCN reaction that required hospitalization: Unknown Has patient had a PCN reaction occurring within the last 10 years: Unknown If all of the above answers are "NO", then may proceed with Cephalosporin use.   Prevacid [lansoprazole] Diarrhea      Medication List    STOP taking these medications   lisinopril 20 MG tablet Commonly known as: ZESTRIL     TAKE these medications   acetaminophen 650 MG CR tablet Commonly known as: TYLENOL Take 1,300 mg by mouth every 8 (eight) hours as needed for pain.   CALCIUM 500+D  PO Take by mouth every other day.   hydrALAZINE 25 MG tablet Commonly known as: APRESOLINE Take 1 tablet (25 mg total) by mouth every 12 (twelve) hours as needed (For standing SBP above 160 or standing DBP above 100).   letrozole 2.5 MG tablet Commonly known as: FEMARA Take 2.5 mg by mouth daily.   midodrine 10 MG tablet Commonly known as: PROAMATINE Take 1 tablet (10 mg total) by mouth 3 (three) times daily with meals.   omeprazole 20 MG capsule Commonly known as: PRILOSEC TAKE 1 CAPSULE (20 MG TOTAL) BY MOUTH DAILY AS NEEDED (INDIGESTION). What changed: See the new instructions.   senna-docusate 8.6-50 MG tablet Commonly known as: Senokot-S Take 2 tablets by mouth 2 (two) times daily as needed for mild constipation.       Consultations:  Cardiology  Procedures/Studies:  2D Echo on 07/06/2019 1. Left ventricular ejection fraction, by estimation, is 60 to 65%. The  left ventricle has normal function. The left ventricle has no regional  wall motion abnormalities. There is mild left ventricular hypertrophy.  Left ventricular diastolic parameters  are indeterminate.  2. Right ventricular systolic function is normal. The right ventricular  size is normal.  3. The mitral valve is degenerative. Trivial mitral valve regurgitation.  No evidence of mitral stenosis.  4. The aortic valve is tricuspid. Aortic valve regurgitation is trivial.  Mild aortic valve stenosis.  5. The inferior vena cava is normal in size with greater  than 50%  respiratory variability, suggesting right atrial pressure of 3 mmHg.    CT Head Wo Contrast  Result Date: 07/01/2019 CLINICAL DATA:  Altered mental status. EXAM: CT HEAD WITHOUT CONTRAST TECHNIQUE: Contiguous axial images were obtained from the base of the skull through the vertex without intravenous contrast. COMPARISON:  Head CT 08/18/2014 FINDINGS: Brain: Stable age related cerebral atrophy, ventriculomegaly and periventricular white matter  disease. No extra-axial fluid collections are identified. No CT findings for acute hemispheric infarction or intracranial hemorrhage. No mass lesions. The brainstem and cerebellum are normal. Vascular: Vascular calcifications but no hyperdense vessels or obvious aneurysm. Skull: No skull fracture or bone lesions. Sinuses/Orbits: The paranasal sinuses and mastoid air cells are clear. The globes are intact. Other: No scalp lesions or hematoma. IMPRESSION: 1. Stable age related cerebral atrophy, ventriculomegaly and periventricular white matter disease. 2. No acute intracranial findings or mass lesions. Electronically Signed   By: Marijo Sanes M.D.   On: 07/01/2019 13:43   DG Chest Portable 1 View  Result Date: 07/01/2019 CLINICAL DATA:  Altered mental status EXAM: PORTABLE CHEST 1 VIEW COMPARISON:  06/30/2019 FINDINGS: The heart size and mediastinal contours are within normal limits. Hiatal hernia. Both lungs are clear. The visualized skeletal structures are unremarkable. IMPRESSION: 1.  No acute abnormality of the lungs. 2.  Hiatal hernia. Electronically Signed   By: Eddie Candle M.D.   On: 07/01/2019 13:19   DG Chest Port 1 View  Result Date: 06/30/2019 CLINICAL DATA:  Patient not feeling well. EXAM: PORTABLE CHEST 1 VIEW COMPARISON:  June 11, 2019 FINDINGS: Moderate to large left hiatal hernia. The heart, hila, mediastinum are normal. No pneumothorax. No pulmonary nodules or masses. No focal infiltrates. IMPRESSION: Moderate to large hiatal hernia.  No other abnormalities. Electronically Signed   By: Dorise Bullion III M.D   On: 06/30/2019 12:15   DG Chest Port 1 View  Result Date: 06/11/2019 CLINICAL DATA:  Weakness. EXAM: PORTABLE CHEST 1 VIEW COMPARISON:  March 31, 2018 FINDINGS: The heart size is stable. Aortic calcifications are noted. There is a large hiatal hernia. There is no pneumothorax. No significant pleural effusion. There is some atelectasis at the lung bases. There is no acute osseous  abnormality. IMPRESSION: No acute cardiopulmonary disease. Large hiatal hernia. Electronically Signed   By: Constance Holster M.D.   On: 06/11/2019 23:52   ECHOCARDIOGRAM COMPLETE  Result Date: 07/06/2019    ECHOCARDIOGRAM REPORT   Patient Name:   GENET FISCHL Date of Exam: 07/06/2019 Medical Rec #:  [id number]         Height:       64.0 in Accession #:    [id number]        Weight:       135.0 lb Date of Birth:  1940/05/31         BSA:          1.655 m Patient Age:    79 years          BP:           173/90 mmHg Patient Gender: F                 HR:           76 bpm. Exam Location:  Inpatient Procedure: 2D Echo, Cardiac Doppler and Color Doppler Indications:    Syncope 780.2/R55  History:        Patient has prior history of Echocardiogram examinations, most  recent 09/10/2018. Previous Myocardial Infarction and CAD; Risk                 Factors:Diabetes and Hypertension.  Sonographer:    Clayton Lefort RDCS (AE) Referring Phys: [id number] [facility]  Sonographer Comments: Suboptimal parasternal window and no subcostal window. Limited patient mobility. IMPRESSIONS  1. Left ventricular ejection fraction, by estimation, is 60 to 65%. The left ventricle has normal function. The left ventricle has no regional wall motion abnormalities. There is mild left ventricular hypertrophy. Left ventricular diastolic parameters are indeterminate.  2. Right ventricular systolic function is normal. The right ventricular size is normal.  3. The mitral valve is degenerative. Trivial mitral valve regurgitation. No evidence of mitral stenosis.  4. The aortic valve is tricuspid. Aortic valve regurgitation is trivial. Mild aortic valve stenosis.  5. The inferior vena cava is normal in size with greater than 50% respiratory variability, suggesting right atrial pressure of 3 mmHg. FINDINGS  Left Ventricle: Left ventricular ejection fraction, by estimation, is 60 to 65%. The left ventricle has normal function. The left ventricle  has no regional wall motion abnormalities. The left ventricular internal cavity size was normal in size. There is  mild left ventricular hypertrophy. Left ventricular diastolic parameters are indeterminate. Right Ventricle: The right ventricular size is normal. No increase in right ventricular wall thickness. Right ventricular systolic function is normal. Left Atrium: Left atrial size was normal in size. Right Atrium: Right atrial size was normal in size. Pericardium: Trivial pericardial effusion is present. The pericardial effusion is localized near the right atrium. Mitral Valve: The mitral valve is degenerative in appearance. There is moderate thickening of the mitral valve leaflet(s). There is moderate calcification of the mitral valve leaflet(s). Normal mobility of the mitral valve leaflets. Moderate mitral annular calcification. Trivial mitral valve regurgitation. No evidence of mitral valve stenosis. MV peak gradient, 6.6 mmHg. The mean mitral valve gradient is 2.0 mmHg. Tricuspid Valve: The tricuspid valve is normal in structure. Tricuspid valve regurgitation is mild . No evidence of tricuspid stenosis. Aortic Valve: The aortic valve is tricuspid. . There is moderate thickening and moderate calcification of the aortic valve. Aortic valve regurgitation is trivial. Mild aortic stenosis is present. There is moderate thickening of the aortic valve. There is  moderate calcification of the aortic valve. Aortic valve mean gradient measures 4.3 mmHg. Aortic valve peak gradient measures 9.5 mmHg. Aortic valve area, by VTI measures 1.43 cm. Pulmonic Valve: The pulmonic valve was normal in structure. Pulmonic valve regurgitation is not visualized. No evidence of pulmonic stenosis. Aorta: The aortic root is normal in size and structure. Venous: The inferior vena cava is normal in size with greater than 50% respiratory variability, suggesting right atrial pressure of 3 mmHg. IAS/Shunts: No atrial level shunt detected by  color flow Doppler.  LEFT VENTRICLE PLAX 2D LVIDd:         2.96 cm  Diastology LVIDs:         2.13 cm  LV e' lateral:   3.53 cm/s LV PW:         1.31 cm  LV E/e' lateral: 24.8 LV IVS:        1.22 cm  LV e' medial:    3.45 cm/s LVOT diam:     1.60 cm  LV E/e' medial:  25.4 LV SV:         44 LV SV Index:   26 LVOT Area:     2.01 cm  RIGHT VENTRICLE RV  Basal diam:  2.35 cm RV S prime:     14.90 cm/s TAPSE (M-mode): 2.0 cm LEFT ATRIUM             Index       RIGHT ATRIUM           Index LA diam:        3.00 cm 1.81 cm/m  RA Area:     10.20 cm LA Vol (A2C):   47.5 ml 28.69 ml/m RA Volume:   18.10 ml  10.93 ml/m LA Vol (A4C):   39.4 ml 23.80 ml/m LA Biplane Vol: 46.3 ml 27.97 ml/m  AORTIC VALVE AV Area (Vmax):    1.34 cm AV Area (Vmean):   1.38 cm AV Area (VTI):     1.43 cm AV Vmax:           154.33 cm/s AV Vmean:          100.733 cm/s AV VTI:            0.306 m AV Peak Grad:      9.5 mmHg AV Mean Grad:      4.3 mmHg LVOT Vmax:         103.00 cm/s LVOT Vmean:        69.000 cm/s LVOT VTI:          0.218 m LVOT/AV VTI ratio: 0.71  AORTA Ao Root diam: 2.80 cm MITRAL VALVE                TRICUSPID VALVE MV Area (PHT): 2.52 cm     TR Peak grad:   29.2 mmHg MV Peak grad:  6.6 mmHg     TR Vmax:        270.00 cm/s MV Mean grad:  2.0 mmHg MV Vmax:       1.28 m/s     SHUNTS MV Vmean:      66.8 cm/s    Systemic VTI:  0.22 m MV Decel Time: 301 msec     Systemic Diam: 1.60 cm MV E velocity: 87.50 cm/s MV A velocity: 148.00 cm/s MV E/A ratio:  0.59 Jenkins Rouge MD Electronically signed by Jenkins Rouge MD Signature Date/Time: 07/06/2019/10:55:24 AM    Final    CT Renal Stone Study  Result Date: 06/12/2019 CLINICAL DATA:  Weakness. EXAM: CT ABDOMEN AND PELVIS WITHOUT CONTRAST TECHNIQUE: Multidetector CT imaging of the abdomen and pelvis was performed following the standard protocol without IV contrast. COMPARISON:  January 21, 2015 FINDINGS: Lower chest: Mild atelectasis is seen within the right lung base. Hepatobiliary: A  diminutive left lobe of the liver is noted. Stable 3.0 cm x 2.5 cm, 6.1 cm x 4.5 cm and 3.7 cm x 2.6 cm cysts are seen within the anterior aspect of the right lobe of the liver. An additional 3.9 cm x 2.9 cm area of mildly increased attenuation is noted within the anteromedial aspect of the right lobe. This is seen on the prior study and is decreased in size on the current exam. There is a mild amount of perihepatic fluid. The gallbladder is poorly visualized and may be contracted. Pancreas: Pancreas is normal in appearance without evidence of pancreatic ductal dilatation. Spleen: A mild amount of perisplenic fluid is seen along the anterior and lateral aspects of an otherwise normal-appearing spleen. Adrenals/Urinary Tract: Adrenal glands are unremarkable. The kidneys are normal in size, without renal calculi or focal lesions. Moderate to marked severity bilateral hydronephrosis and hydroureter are seen. The urinary bladder  is markedly distended. Multiple large urinary bladder diverticula are seen. These are increased in size when compared to the prior study. The largest measures approximately 10.0 cm x 9.2 cm and is located along the anterolateral aspect of the urinary bladder on the right. Stomach/Bowel: A very large gastric hernia is seen. This is present on the prior study. The appendix is not clearly identified. No evidence of bowel dilatation. Numerous noninflamed diverticula are seen throughout the large bowel. A marked amount of inflammatory fat stranding is seen within the abdomen on the right. This surrounds the proximal portion of the duodenum and extends inferiorly within the right abdomen along the anterior and posterior pararenal spaces. Vascular/Lymphatic: There is marked severity aortic calcification. No enlarged abdominal or pelvic lymph nodes. Reproductive: Status post hysterectomy. No adnexal masses. Other: A mild-to-moderate amount of fluid is seen within the lateral aspect of the abdomen on the  right. This extends inferiorly into the right hemipelvis. A small amount of posterior pelvic free fluid is noted. Musculoskeletal: Multilevel degenerative changes seen throughout the lumbar spine. IMPRESSION: 1. Marked severity inflammatory process within the right abdomen within extensive amount of associated inflammatory fat stranding, as described above. Given the pattern of inflammatory stranding, this is likely consistent with marked severity duodenitis, however, correlation with a contrast-enhanced CT evaluation of the abdomen and pelvis is recommended. 2. Colonic diverticulosis. 3. Moderate to marked severity bilateral hydronephrosis and hydroureter 4. Mild-to-moderate amount of fluid is seen within the lateral aspect of the abdomen on the right. This extends inferiorly into the right hemipelvis. 5. Large gastric hernia. 6. Stable hepatic cysts. 7. Stable 3.9 cm x 2.9 cm area of mildly increased attenuation within the anteromedial aspect of the right lobe of the liver which may represent a previously collapsed hepatic cyst. 8. Multiple large urinary bladder diverticula, increased in size when compared to the prior study. Aortic Atherosclerosis (ICD10-I70.0). Electronically Signed   By: Virgina Norfolk M.D.   On: 06/12/2019 03:02   VAS US CAROTID  Result Date: 07/07/2019 Carotid Arterial Duplex Study Indications: Syncope. Performing Technologist: June Leap RDMS, RVT  Examination Guidelines: A complete evaluation includes B-mode imaging, spectral Doppler, color Doppler, and power Doppler as needed of all accessible portions of each vessel. Bilateral testing is considered an integral part of a complete examination. Limited examinations for reoccurring indications may be performed as noted.  Right Carotid Findings: +----------+--------+--------+--------+-------------------------+--------+           PSV cm/sEDV cm/sStenosisPlaque Description       Comments  +----------+--------+--------+--------+-------------------------+--------+ CCA Prox  75      16                                                +----------+--------+--------+--------+-------------------------+--------+ CCA Distal44      10                                                +----------+--------+--------+--------+-------------------------+--------+ ICA Prox  44      16      1-39%   heterogenous and calcific         +----------+--------+--------+--------+-------------------------+--------+ ICA Distal60      24                                                +----------+--------+--------+--------+-------------------------+--------+  ECA       85      4                                                 +----------+--------+--------+--------+-------------------------+--------+ +----------+--------+-------+----------------+-------------------+           PSV cm/sEDV cmsDescribe        Arm Pressure (mmHG) +----------+--------+-------+----------------+-------------------+ [id number]            Multiphasic, WNL                    +----------+--------+-------+----------------+-------------------+ +---------+--------+--+--------+--+---------+ VertebralPSV cm/s53EDV cm/s16Antegrade +---------+--------+--+--------+--+---------+  Left Carotid Findings: +----------+--------+--------+--------+------------------+--------+           PSV cm/sEDV cm/sStenosisPlaque DescriptionComments +----------+--------+--------+--------+------------------+--------+ CCA Prox  72      18                                         +----------+--------+--------+--------+------------------+--------+ CCA Distal57      11                                         +----------+--------+--------+--------+------------------+--------+ ICA Prox  41      11      1-39%   heterogenous               +----------+--------+--------+--------+------------------+--------+ ICA Distal71       25                                         +----------+--------+--------+--------+------------------+--------+ ECA       58      7                                          +----------+--------+--------+--------+------------------+--------+ +----------+--------+--------+------------+-------------------+           PSV cm/sEDV cm/sDescribe    Arm Pressure (mmHG) +----------+--------+--------+------------+-------------------+ Subclavian                Not assessed                    +----------+--------+--------+------------+-------------------+ +---------+--------+--------+--------------+ VertebralPSV cm/sEDV cm/sNot identified +---------+--------+--------+--------------+   Summary: Right Carotid: Velocities in the right ICA are consistent with a 1-39% stenosis. Left Carotid: Velocities in the left ICA are consistent with a 1-39% stenosis.  *See table(s) above for measurements and observations.  Electronically signed by Monica Martinez MD on 07/07/2019 at 12:51:35 PM.    Final         The results of significant diagnostics from this hospitalization (including imaging, microbiology, ancillary and laboratory) are listed below for reference.     Microbiology: Recent Results (from the past 240 hour(s))  Urine culture     Status: Abnormal   Collection Time: 06/30/19  1:45 PM   Specimen: Urine, Random  Result Value Ref Range Status   Specimen Description   Final    URINE, RANDOM Performed at [facility], [address] [place], [place] [address]    Special  Requests   Final    NONE Performed at [facility], [address] [place], [place] [address]    Culture (A)  Final    >=100,000 COLONIES/mL ESCHERICHIA COLI 80,000 COLONIES/mL KLEBSIELLA PNEUMONIAE    Report Status 07/02/2019 FINAL  Final   Organism ID, Bacteria ESCHERICHIA COLI (A)  Final   Organism ID, Bacteria KLEBSIELLA PNEUMONIAE (A)  Final      Susceptibility   Escherichia coli -  MIC*    AMPICILLIN >=32 RESISTANT Resistant     CEFAZOLIN <=4 SENSITIVE Sensitive     CEFTRIAXONE <=1 SENSITIVE Sensitive     CIPROFLOXACIN >=4 RESISTANT Resistant     GENTAMICIN <=1 SENSITIVE Sensitive     IMIPENEM <=0.25 SENSITIVE Sensitive     NITROFURANTOIN <=16 SENSITIVE Sensitive     TRIMETH/SULFA >=320 RESISTANT Resistant     AMPICILLIN/SULBACTAM 8 SENSITIVE Sensitive     PIP/TAZO <=4 SENSITIVE Sensitive     * >=100,000 COLONIES/mL ESCHERICHIA COLI   Klebsiella pneumoniae - MIC*    AMPICILLIN RESISTANT Resistant     CEFAZOLIN <=4 SENSITIVE Sensitive     CEFTRIAXONE <=1 SENSITIVE Sensitive     CIPROFLOXACIN <=0.25 SENSITIVE Sensitive     GENTAMICIN <=1 SENSITIVE Sensitive     IMIPENEM <=0.25 SENSITIVE Sensitive     NITROFURANTOIN 32 SENSITIVE Sensitive     TRIMETH/SULFA <=20 SENSITIVE Sensitive     AMPICILLIN/SULBACTAM 4 SENSITIVE Sensitive     PIP/TAZO <=4 SENSITIVE Sensitive     * 80,000 COLONIES/mL KLEBSIELLA PNEUMONIAE  Urine culture     Status: Abnormal   Collection Time: 07/01/19 12:54 PM   Specimen: Urine, Random  Result Value Ref Range Status   Specimen Description URINE, RANDOM  Final   Special Requests SITE NOT SPECIFIED  Final   Culture (A)  Final    >=100,000 COLONIES/mL ESCHERICHIA COLI >=100,000 COLONIES/mL KLEBSIELLA PNEUMONIAE    Report Status 07/03/2019 FINAL  Final   Organism ID, Bacteria ESCHERICHIA COLI (A)  Final   Organism ID, Bacteria KLEBSIELLA PNEUMONIAE (A)  Final      Susceptibility   Escherichia coli - MIC*    AMPICILLIN >=32 RESISTANT Resistant     CEFAZOLIN <=4 SENSITIVE Sensitive     CEFTRIAXONE <=1 SENSITIVE Sensitive     CIPROFLOXACIN >=4 RESISTANT Resistant     GENTAMICIN <=1 SENSITIVE Sensitive     IMIPENEM <=0.25 SENSITIVE Sensitive     NITROFURANTOIN <=16 SENSITIVE Sensitive     TRIMETH/SULFA >=320 RESISTANT Resistant     AMPICILLIN/SULBACTAM 8 SENSITIVE Sensitive     PIP/TAZO <=4 SENSITIVE Sensitive     * >=100,000  COLONIES/mL ESCHERICHIA COLI   Klebsiella pneumoniae - MIC*    AMPICILLIN RESISTANT Resistant     CEFAZOLIN <=4 SENSITIVE Sensitive     CEFTRIAXONE <=1 SENSITIVE Sensitive     CIPROFLOXACIN <=0.25 SENSITIVE Sensitive     GENTAMICIN <=1 SENSITIVE Sensitive     IMIPENEM <=0.25 SENSITIVE Sensitive     NITROFURANTOIN <=16 SENSITIVE Sensitive     TRIMETH/SULFA <=20 SENSITIVE Sensitive     AMPICILLIN/SULBACTAM 4 SENSITIVE Sensitive     PIP/TAZO <=4 SENSITIVE Sensitive     * >=100,000 COLONIES/mL KLEBSIELLA PNEUMONIAE  Culture, blood (routine x 2)     Status: Abnormal   Collection Time: 07/01/19  2:31 PM   Specimen: BLOOD  Result Value Ref Range Status   Specimen Description BLOOD LEFT ANTECUBITAL  Final   Special Requests   Final    BOTTLES DRAWN AEROBIC AND ANAEROBIC Blood  Culture results may not be optimal due to an inadequate volume of blood received in culture bottles   Culture  Setup Time   Final    GRAM NEGATIVE RODS ANAEROBIC BOTTLE ONLY CRITICAL VALUE NOTED.  VALUE IS CONSISTENT WITH PREVIOUSLY REPORTED AND CALLED VALUE.    Culture (A)  Final    ESCHERICHIA COLI SUSCEPTIBILITIES PERFORMED ON PREVIOUS CULTURE WITHIN THE LAST 5 DAYS. Performed at [facility], [place] [address], [place], [place] [address]    Report Status 07/05/2019 FINAL  Final  Culture, blood (routine x 2)     Status: Abnormal   Collection Time: 07/01/19  2:31 PM   Specimen: BLOOD LEFT ARM  Result Value Ref Range Status   Specimen Description BLOOD LEFT ARM  Final   Special Requests   Final    BOTTLES DRAWN AEROBIC AND ANAEROBIC Blood Culture adequate volume   Culture  Setup Time   Final    GRAM NEGATIVE RODS ANAEROBIC BOTTLE ONLY CRITICAL RESULT CALLED TO, READ BACK BY AND VERIFIED WITH: PHARMD G ABBOTT [id number]:[id number] AT 65 AM BY CM Performed at [facility], [place] [address] [place], [place] [address]    Culture ESCHERICHIA COLI (A)  Final   Report Status 07/05/2019 FINAL  Final   Organism ID,  Bacteria ESCHERICHIA COLI  Final      Susceptibility   Escherichia coli - MIC*    AMPICILLIN >=32 RESISTANT Resistant     CEFAZOLIN <=4 SENSITIVE Sensitive     CEFEPIME <=1 SENSITIVE Sensitive     CEFTAZIDIME <=1 SENSITIVE Sensitive     CEFTRIAXONE <=1 SENSITIVE Sensitive     CIPROFLOXACIN >=4 RESISTANT Resistant     GENTAMICIN <=1 SENSITIVE Sensitive     IMIPENEM <=0.25 SENSITIVE Sensitive     TRIMETH/SULFA >=320 RESISTANT Resistant     AMPICILLIN/SULBACTAM 8 SENSITIVE Sensitive     PIP/TAZO <=4 SENSITIVE Sensitive     * ESCHERICHIA COLI  Blood Culture ID Panel (Reflexed)     Status: Abnormal   Collection Time: 07/01/19  2:31 PM  Result Value Ref Range Status   Enterococcus species NOT DETECTED NOT DETECTED Final   Listeria monocytogenes NOT DETECTED NOT DETECTED Final   Staphylococcus species NOT DETECTED NOT DETECTED Final   Staphylococcus aureus (BCID) NOT DETECTED NOT DETECTED Final   Streptococcus species NOT DETECTED NOT DETECTED Final   Streptococcus agalactiae NOT DETECTED NOT DETECTED Final   Streptococcus pneumoniae NOT DETECTED NOT DETECTED Final   Streptococcus pyogenes NOT DETECTED NOT DETECTED Final   Acinetobacter baumannii NOT DETECTED NOT DETECTED Final   Enterobacteriaceae species DETECTED (A) NOT DETECTED Final    Comment: Enterobacteriaceae represent a large family of gram-negative bacteria, not a single organism. CRITICAL RESULT CALLED TO, READ BACK BY AND VERIFIED WITH: PHARMD G ABBOTT JQ:[id number] AT 84 AM Y CM    Enterobacter cloacae complex NOT DETECTED NOT DETECTED Final   Escherichia coli DETECTED (A) NOT DETECTED Final    Comment: CRITICAL RESULT CALLED TO, READ BACK BY AND VERIFIED WITH: PHARMD G ABBOTT [id number]:[id number] AT [id number] AM BY CM    Klebsiella oxytoca NOT DETECTED NOT DETECTED Final   Klebsiella pneumoniae NOT DETECTED NOT DETECTED Final   Proteus species NOT DETECTED NOT DETECTED Final   Serratia marcescens NOT DETECTED NOT DETECTED Final   Carbapenem  resistance NOT DETECTED NOT DETECTED Final   Haemophilus influenzae NOT DETECTED NOT DETECTED Final   Neisseria meningitidis NOT DETECTED NOT DETECTED Final   Pseudomonas aeruginosa NOT  DETECTED NOT DETECTED Final   Candida albicans NOT DETECTED NOT DETECTED Final   Candida glabrata NOT DETECTED NOT DETECTED Final   Candida krusei NOT DETECTED NOT DETECTED Final   Candida parapsilosis NOT DETECTED NOT DETECTED Final   Candida tropicalis NOT DETECTED NOT DETECTED Final    Comment: Performed at [facility], [place] [address] [place], [place] [address]  SARS CORONAVIRUS 2 (TAT 6-24 HRS) Nasopharyngeal Nasopharyngeal Swab     Status: None   Collection Time: 07/01/19  4:44 PM   Specimen: Nasopharyngeal Swab  Result Value Ref Range Status   SARS Coronavirus 2 NEGATIVE NEGATIVE Final    Comment: (NOTE) SARS-CoV-2 target nucleic acids are NOT DETECTED. The SARS-CoV-2 RNA is generally detectable in upper and lower respiratory specimens during the acute phase of infection. Negative results do not preclude SARS-CoV-2 infection, do not rule out co-infections with other pathogens, and should not be used as the sole basis for treatment or other patient management decisions. Negative results must be combined with clinical observations, patient history, and epidemiological information. The expected result is Negative. Fact Sheet for Patients: SugarRoll.be Fact Sheet for Healthcare Providers: https://www.woods-mathews.com/ This test is not yet approved or cleared by the [place] FDA and  has been authorized for detection and/or diagnosis of SARS-CoV-2 by FDA under an Emergency Use Authorization (EUA). This EUA will remain  in effect (meaning this test can be used) for the duration of the COVID-19 declaration under Section 56 4(b)(1) of the Act, 21 U.S.C. section 360bbb-3(b)(1), unless the authorization is terminated or revoked sooner. Performed at  [facility], [place] [address], [place], [place] [address]   SARS CORONAVIRUS 2 (TAT 6-24 HRS) Nasopharyngeal Nasopharyngeal Swab     Status: None   Collection Time: 07/09/19  1:36 PM   Specimen: Nasopharyngeal Swab  Result Value Ref Range Status   SARS Coronavirus 2 NEGATIVE NEGATIVE Final    Comment: (NOTE) SARS-CoV-2 target nucleic acids are NOT DETECTED. The SARS-CoV-2 RNA is generally detectable in upper and lower respiratory specimens during the acute phase of infection. Negative results do not preclude SARS-CoV-2 infection, do not rule out co-infections with other pathogens, and should not be used as the sole basis for treatment or other patient management decisions. Negative results must be combined with clinical observations, patient history, and epidemiological information. The expected result is Negative. Fact Sheet for Patients: SugarRoll.be Fact Sheet for Healthcare Providers: https://www.woods-mathews.com/ This test is not yet approved or cleared by the [place] FDA and  has been authorized for detection and/or diagnosis of SARS-CoV-2 by FDA under an Emergency Use Authorization (EUA). This EUA will remain  in effect (meaning this test can be used) for the duration of the COVID-19 declaration under Section 56 4(b)(1) of the Act, 21 U.S.C. section 360bbb-3(b)(1), unless the authorization is terminated or revoked sooner. Performed at [facility], [place] [address], [place], [place] [address]      Labs: BNP (last 3 results) No results for input(s): BNP in the last 8760 hours. Basic Metabolic Panel: Recent Labs  Lab 07/06/19 0259 07/07/19 0236 07/08/19 0415 07/09/19 0339 07/10/19 0252  NA 142 145 142 143 142  K 3.8 3.9 3.4* 4.1 3.6  CL 109 109 104 109 110  CO2 23 25 26 26 25   GLUCOSE 97 91 101* 103* 102*  BUN 15 16 23  24* 21  CREATININE 1.24* 0.97 1.17* 1.25* 1.27*  CALCIUM 9.0 9.4 9.3 9.1 9.4  MG 1.9  1.9  1.8 1.9 1.8  PHOS 2.8 3.5 3.9 3.6 3.6   Liver Function Tests: Recent Labs  Lab 07/04/19 0137 07/05/19 0328 07/06/19 0259 07/07/19 0236 07/08/19 0415 07/09/19 0339 07/10/19 0252  AST 25  --   --   --   --   --   --   ALT 18  --   --   --   --   --   --   ALKPHOS 40  --   --   --   --   --   --   BILITOT 0.6  --   --   --   --   --   --   PROT 5.8*  --   --   --   --   --   --   ALBUMIN 2.6*   < > 2.8* 2.8* 2.8* 2.9* 3.0*   < > = values in this interval not displayed.   No results for input(s): LIPASE, AMYLASE in the last 168 hours. No results for input(s): AMMONIA in the last 168 hours. CBC: Recent Labs  Lab 07/05/19 0328 07/05/19 0328 07/06/19 0259 07/07/19 0236 07/08/19 0415 07/09/19 0339 07/10/19 0252  WBC 5.5  --  7.7 8.0 8.8 7.5  --   HGB 10.2*   < > 10.0* 10.4* 10.2* 9.9* 10.3*  HCT 31.9*   < > 32.4* 34.0* 33.3* 33.0* 33.0*  MCV 86.7  --  88.3 87.9 89.3 90.7  --   PLT 277  --  295 321 352 362  --    < > = values in this interval not displayed.   Cardiac Enzymes: No results for input(s): CKTOTAL, CKMB, CKMBINDEX, TROPONINI in the last 168 hours. BNP: Invalid input(s): POCBNP CBG: Recent Labs  Lab 07/05/19 2005  GLUCAP 105*   D-Dimer No results for input(s): DDIMER in the last 72 hours. Hgb A1c No results for input(s): HGBA1C in the last 72 hours. Lipid Profile No results for input(s): CHOL, HDL, LDLCALC, TRIG, CHOLHDL, LDLDIRECT in the last 72 hours. Thyroid function studies No results for input(s): TSH, T4TOTAL, T3FREE, THYROIDAB in the last 72 hours.  Invalid input(s): FREET3 Anemia work up No results for input(s): VITAMINB12, FOLATE, FERRITIN, TIBC, IRON, RETICCTPCT in the last 72 hours. Urinalysis    Component Value Date/Time   COLORURINE AMBER (A) 07/01/2019 1254   APPEARANCEUR CLOUDY (A) 07/01/2019 1254   APPEARANCEUR Cloudy (A) 04/25/2015 1449   LABSPEC 1.011 07/01/2019 1254   LABSPEC 1.021 01/17/2014 1524   PHURINE 6.0 07/01/2019  1254   GLUCOSEU NEGATIVE 07/01/2019 1254   GLUCOSEU Negative 01/17/2014 1524   HGBUR SMALL (A) 07/01/2019 1254   BILIRUBINUR NEGATIVE 07/01/2019 1254   BILIRUBINUR neg 06/30/2016 1619   BILIRUBINUR Negative 04/25/2015 1449   BILIRUBINUR Negative 01/17/2014 1524   KETONESUR NEGATIVE 07/01/2019 1254   PROTEINUR 100 (A) 07/01/2019 1254   UROBILINOGEN 0.2 06/30/2016 1619   NITRITE NEGATIVE 07/01/2019 1254   LEUKOCYTESUR LARGE (A) 07/01/2019 1254   LEUKOCYTESUR 2+ 01/17/2014 1524   Sepsis Labs Invalid input(s): PROCALCITONIN,  WBC,  LACTICIDVEN   Time coordinating discharge: 40 minutes  SIGNED:  Mercy Riding, MD  [facility] 07/10/2019, 12:31 PM  If 7PM-7AM, please contact night-coverage [email or web address] Password [email or web address]

## 2019-07-10 NOTE — TOC Progression Note (Addendum)
Transition of Care [facility]) - Progression Note    Patient Details  Name: [name] [name] MRN: [id number] Date of Birth: 21-Nov-1940  Transition of Care [facility]) CM/SW [place], [place] Phone Number: 07/10/2019, 12:22 PM  Clinical Narrative:    12:55 CSW contacted [facility] to confirm dc summary received. Tammy stated they no longer have the bed available and it will be available Wednesday. CSW updated patient's spouse and MD.  Patient's son [name] inquired on CIR for patient and requested patient be consulted. CSW informed MD that the family is requesting a CIR consult.  12p CSW received patient's insurance authorization. Auth [id number], good thru 5/11. CSW made telephone call to patient's spouse, awaiting a callback. CSW confirmed [facility] is able to admit patient today. CSW will continue to follow.    Expected Discharge Plan: [place] Barriers to Discharge: Continued Medical Work up  Expected Discharge Plan and Services Expected Discharge Plan: [place] arrangements for the past 2 months: Single Family Home                                       Social Determinants of Health (SDOH) Interventions    Readmission Risk Interventions Readmission Risk Prevention Plan 07/04/2019  Transportation Screening Complete  PCP or Specialist Appt within 3-5 Days Complete  HRI or Home Care Consult Complete  Medication Review (RN Care Manager) Referral to Pharmacy  Some recent data might be hidden

## 2019-07-10 NOTE — Progress Notes (Signed)
Physical Therapy Treatment Patient Details Name: [name] [name] MRN: [id number] DOB: [birth year] Today's Date: 07/10/2019    History of Present Illness 79 yo female admitted to ED on 4/24 for weakness, lightheadedness, fatigue, and syncope; indwelling catheter placed PTA by urology for urinary retention. Pt with diagnosis of sepsis, suspect secondary to UTI. Pt with recent ED visit 4/21-4/22 for syncope. 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    PT Comments    Pt in bed upon arrival of PT/OT, agreeable to session with focus on progression of mobility and ambulation. The pt was able to demo improvements in bed mobility, and was able to power up from the bed with only minA to rise. The pt was then able to demo multiple bouts of walking in the room with minG for safety and no LOB. The pt completed multiple standing tasks at the sink with good dynamic stability with use of single UE support.  The pt continues to be limited by poor command following and cognition, but was able to ambulate significantly more during today's session without change in vitals or LOB. The pt will continue to benefit from skilled PT to further progress functional mobility, endurance, and strength to improve safety and independence with mobility.   Follow Up Recommendations  SNF;Supervision/Assistance - 24 hour     Equipment Recommendations  None recommended by PT    Recommendations for Other Services       Precautions / Restrictions Precautions Precautions: Fall Precaution Comments: orthostatic Restrictions Weight Bearing Restrictions: No    Mobility  Bed Mobility Overal bed mobility: Needs Assistance Bed Mobility: Supine to Sit;Sit to Supine     Supine to sit: Min guard Sit to supine: Min guard   General bed mobility comments: Min gaurd for bed mobility, however requires significant increased commands to  sequence  Transfers Overall transfer level: Needs assistance Equipment used: Rolling walker (2 wheeled) Transfers: Sit to/from Stand Sit to Stand: Min assist         General transfer comment: Min A to power up, however requires hand over hand assist and significant increased time to sequence steps, lateral stepping completed x4 at sink, required visual cues of OTAs feet to complete each time despite repetitions  Ambulation/Gait Ambulation/Gait assistance: Min guard Gait Distance (Feet): 30 Feet Assistive device: Rolling walker (2 wheeled) Gait Pattern/deviations: Step-through pattern;Decreased stride length;Trunk flexed;Shuffle Gait velocity: dec Gait velocity interpretation: <1.31 ft/sec, indicative of household ambulator General Gait Details: pt able to ambulate 30 ft in room with RW, requires sig VC and visual cues for directions and hand-over-hand guidance for holding RW.   Stairs             Wheelchair Mobility    Modified Rankin (Stroke Patients Only)       Balance Overall balance assessment: Needs assistance Sitting-balance support: Feet supported Sitting balance-Leahy Scale: Good Sitting balance - Comments: able to sit EOB without PT support, tolerated LE MMT well without posterior LOB although slight posterior leaning present   Standing balance support: Bilateral upper extremity supported;During functional activity Standing balance-Leahy Scale: Poor Standing balance comment: reliant on external support, hand over hand assist each time to place hands on walker when completing ADLs at sink and lateral stepping                            Cognition Arousal/Alertness: Awake/alert Behavior During  Therapy: WFL for tasks assessed/performed Overall Cognitive Status: No family/caregiver present to determine baseline cognitive functioning(family present, asleep during session) Area of Impairment: Memory;Safety/judgement;Awareness;Problem  solving;Attention;Following commands                 Orientation Level: Disoriented to;Place;Time;Situation Current Attention Level: Sustained;Focused Memory: Decreased short-term memory Following Commands: Follows one step commands with increased time;Follows one step commands inconsistently Safety/Judgement: Decreased awareness of safety;Decreased awareness of deficits Awareness: Emergent Problem Solving: Slow processing;Decreased initiation;Requires verbal cues;Requires tactile cues;Difficulty sequencing General Comments: Pt oriented, however required max multi-modal cues in order to sequence ADLs, then at one point looked at wall stating "Hi, Ill be with you in a minute" stating a family friend was in the room in addition to her son (son sleeping on opposite side of room)      Exercises      General Comments        Pertinent Vitals/Pain Pain Assessment: No/denies pain Faces Pain Scale: No hurt Pain Intervention(s): Monitored during session;Repositioned    Home Living                      Prior Function            PT Goals (current goals can now be found in the care plan section) Acute Rehab PT Goals Patient Stated Goal: feel better PT Goal Formulation: With patient Time For Goal Achievement: 07/18/19 Potential to Achieve Goals: Good Progress towards PT goals: Progressing toward goals    Frequency    Min 3X/week      PT Plan Current plan remains appropriate    Co-evaluation PT/OT/SLP Co-Evaluation/Treatment: Yes Reason for Co-Treatment: Necessary to address cognition/behavior during functional activity;To address functional/ADL transfers PT goals addressed during session: Mobility/safety with mobility;Balance;Proper use of DME;Strengthening/ROM        AM-PAC PT "6 Clicks" Mobility   Outcome Measure  Help needed turning from your back to your side while in a flat bed without using bedrails?: A Little Help needed moving from lying on your  back to sitting on the side of a flat bed without using bedrails?: A Little Help needed moving to and from a bed to a chair (including a wheelchair)?: A Little Help needed standing up from a chair using your arms (e.g., wheelchair or bedside chair)?: A Little Help needed to walk in hospital room?: A Little Help needed climbing 3-5 steps with a railing? : A Lot 6 Click Score: 17    End of Session Equipment Utilized During Treatment: Gait belt Activity Tolerance: Patient tolerated treatment well Patient left: in bed;with call bell/phone within reach;with bed alarm set Nurse Communication: Mobility status PT Visit Diagnosis: Unsteadiness on feet (R26.81);Muscle weakness (generalized) (M62.81)     Time: [id number] PT Time Calculation (min) (ACUTE ONLY): 26 min  Charges:  $Gait Training: 8-22 mins                     Karma Ganja, PT, DPT   Acute Rehabilitation Department Pager #: [phone]   Otho Bellows 07/10/2019, 2:15 PM

## 2019-07-11 LAB — RENAL FUNCTION PANEL
Albumin: 3 g/dL — ABNORMAL LOW (ref 3.5–5.0)
Anion gap: 13 (ref 5–15)
BUN: 23 mg/dL (ref 8–23)
CO2: 21 mmol/L — ABNORMAL LOW (ref 22–32)
Calcium: 9.4 mg/dL (ref 8.9–10.3)
Chloride: 108 mmol/L (ref 98–111)
Creatinine, Ser: 1.44 mg/dL — ABNORMAL HIGH (ref 0.44–1.00)
GFR calc Af Amer: 40 mL/min — ABNORMAL LOW (ref >60)
GFR calc non Af Amer: 34 mL/min — ABNORMAL LOW (ref >60)
Glucose, Bld: 105 mg/dL — ABNORMAL HIGH (ref 70–99)
Phosphorus: 3.6 mg/dL (ref 2.5–4.6)
Potassium: 3.5 mmol/L (ref 3.5–5.1)
Sodium: 142 mmol/L (ref 135–145)

## 2019-07-11 LAB — MAGNESIUM: Magnesium: 1.7 mg/dL (ref 1.7–2.4)

## 2019-07-11 MED ORDER — SODIUM CHLORIDE 0.9 % IV BOLUS
250.0000 mL | Freq: Once | INTRAVENOUS | Status: AC
  Administered 2019-07-11: 250 mL via INTRAVENOUS

## 2019-07-11 MED ORDER — SODIUM CHLORIDE 0.9 % IV SOLN: INTRAVENOUS | Status: DC

## 2019-07-11 NOTE — Progress Notes (Signed)
Inpatient Rehabilitation Admissions Coordinator  Inpatient rehab consult received. I met at bedside with patient for assessment and then contacted her son, Brian , by phone. Therapy progress and Medical chart reviewed. Patient is not in need of the intensive inpatient rehab admission at this time. SNF is recommended. I have alerted Dr. Regalado and SW, Cyrus. We will sign off at this time. Please call with any questions.   , RN, MSN Rehab Admissions Coordinator [phone] 07/11/2019 11:58 AM  

## 2019-07-11 NOTE — TOC Progression Note (Addendum)
Transition of Care [facility]) - Progression Note    Patient Details  Name: [name] [name] MRN: [id number] Date of Birth: June 17, 1940  Transition of Care [facility]) CM/SW [place], [place] Phone Number: 07/11/2019, 10:23 AM  Clinical Narrative:     Pt wont discharge today due to continued medical work up. Still awaiting CIR and will follow up with son regarding CIR assessment. Voicemail left with Tammy from [facility] attempting to inform of pt. Not transitioning today.   1223: Spoke [facility] from [facility] and informed pt likely transition tomorrow.   1454: Spoke with pts son [name]. [name] expressed a better understanding of why pt is not recommended to CIR after speaking with CIR admissions. Son is okay with plan for pt to go to [facility]. Informed [name] that pt will transition tomorrow instead of today if medically stable.    Expected Discharge Plan: [place] Barriers to Discharge: No Barriers Identified  Expected Discharge Plan and Services Expected Discharge Plan: [place] arrangements for the past 2 months: Single Family Home Expected Discharge Date: 07/10/19                                     Social Determinants of Health (SDOH) Interventions    Readmission Risk Interventions Readmission Risk Prevention Plan 07/04/2019  Transportation Screening Complete  PCP or Specialist Appt within 3-5 Days Complete  HRI or Home Care Consult Complete  Medication Review (RN Care Manager) Referral to Pharmacy  Some recent data might be hidden

## 2019-07-11 NOTE — Progress Notes (Signed)
PROGRESS NOTE    [name] [name]  [id number] DOB: Jun 25, 1940 DOA: 07/01/2019 PCP: Jerrol Banana., MD   Brief Narrative: 79 year old with past medical history significant for breast cancer, CAD, hypertension and dementia presents with recurrent syncopal episode.  She was admitted for sepsis due to E. coli bacteremia and Klebsiella UTI.  She was treated with ceftriaxone and transition to Ancef based on sensitivity.  She was previously hospitalized with AKI in the setting of ureteral obstruction requiring Foley catheter about a month ago.  At that time Foley was discontinued on discharge.  However, she was found to have acute urinary retention when she presented to the urology office for follow-up prior to this admission.  Recommendation was to follow-up with urology 1 week after discharge.  This admission patient was found to have E. coli and Klebsiella UTI as well as E. coli bacteremia.  She completed 7 days of antibiotics course on 4/30 Cardiology consulted on admission and recommended outpatient follow-up and signed off.  Patient noted to have significant orthostatic hypotension.  Echo and carotid ultrasound unrevealing.  Patient was a started on midodrine but continued to have orthostatic hypotension likely due to autonomic dysregulation.  Patient will be discharged on midodrine.  Home lisinopril has been discontinued.  As needed hydralazine for with parameters based on his standing blood pressure.  Recommended frequent orientation and delirium and fall precautions.    Assessment & Plan:   Active Problems:   Urinary tract infection associated with indwelling urethral catheter (HCC)   Sepsis ([place])   1-Severe sepsis due to catheter associated E. coli UTI and bacteremia, and Klebsiella UTI: POA.  Evidence of endorgan damage including confusion, syncope, hypotension and lactic acidosis on admission.  Sepsis has resolved. -Patient completed 7 days course of antibiotics on 4/30  ceftriaxone and Omnicef  2-Recurrent syncope/orthostatic hypotension: Continues to have significant orthostasis. Could be related to autonomic dysregulation -Outpatient follow-up with cardiology -Continue with TED hose -Continue with midodrine 10 mg 3 times a day  3-Hypertension: Complicated by orthostatic hypotension: Discontinue lisinopril As needed hydralazine for standing systolic blood pressure above 160 and standing diastolic above [id number]  4-Ureteral obstruction indwelling Foley catheter inserted by urology He will need to be discharged with Foley catheter and follow-up with urology in 1 week  5-AKI on CKD stage III AAA Creatinine up today.  Will start IV fluids repeat labs in the morning  6-Hypokalemia: Resolved  7-Shortened PR interval: Outpatient follow-up with cardiology 8-History of breast cancer: Continue with Femara 9-Cognitive impairment/dementia without behavioral disturbance: Frequent orientation and delirium precaution  Estimated body mass index is 23.17 kg/m as calculated from the following:   Height as of this encounter: 5\' 4"  (1.626 m).   Weight as of this encounter: 61.2 kg.   DVT prophylaxis: Heparin Code Status: DNR Family Communication: No family at bedside Disposition Plan:  Patient is from: Home Anticipated d/c date: 07/12/2019 if renal function improved Anticipated discharge to a skilled nursing facility Barriers to d/c or necessity for inpatient status: Worsening kidney function, plan to provide IV fluids today  Consultants:   Cardiology  Procedures:  Procedures/Studies:  2D Echo on 07/06/2019 1. Left ventricular ejection fraction, by estimation, is 60 to 65%. The  left ventricle has normal function. The left ventricle has no regional  wall motion abnormalities. There is mild left ventricular hypertrophy.  Left ventricular diastolic parameters  are indeterminate.  2. Right ventricular systolic function is normal. The right ventricular  size  is normal.  3.  The mitral valve is degenerative. Trivial mitral valve regurgitation.  No evidence of mitral stenosis.  4. The aortic valve is tricuspid. Aortic valve regurgitation is trivial.  Mild aortic valve stenosis.  5. The inferior vena cava is normal in size with greater than 50%  respiratory variability, suggesting right atrial pressure of 3 mmHg.    Antimicrobials:  Ceftriaxone and Omnicef  Subjective: She is alert, oriented to person and place, pleasantly confused  Objective: Vitals:   07/10/19 1404 07/10/19 1608 07/11/19 0100 07/11/19 0741  BP:  103/61 (!) 141/72 127/76  Pulse: 60 82  63  Resp:  18 18 20   Temp:  (!) 97.3 F (36.3 C) 97.9 F (36.6 C) 98.1 F (36.7 C)  TempSrc:  Oral Oral   SpO2: 100% 100% 100% 100%  Weight:      Height:       No intake or output data in the 24 hours ending 07/11/19 1418 Filed Weights   07/01/19 1421  Weight: 61.2 kg    Examination:  General exam: Appears calm and comfortable  Respiratory system: Clear to auscultation. Respiratory effort normal. Cardiovascular system: S1 & S2 heard, RRR. No JVD, murmurs, rubs, gallops or clicks. No pedal edema. Gastrointestinal system: Abdomen is nondistended, soft and nontender. No organomegaly or masses felt. Normal bowel sounds heard. Central nervous system: Alert and oriented.  Extremities: Symmetric 5 x 5 power. Skin: No rashes, lesions or ulcers   Data Reviewed: I have personally reviewed following labs and imaging studies  CBC: Recent Labs  Lab 07/05/19 0328 07/05/19 0328 07/06/19 0259 07/07/19 0236 07/08/19 0415 07/09/19 0339 07/10/19 0252  WBC 5.5  --  7.7 8.0 8.8 7.5  --   HGB 10.2*   < > 10.0* 10.4* 10.2* 9.9* 10.3*  HCT 31.9*   < > 32.4* 34.0* 33.3* 33.0* 33.0*  MCV 86.7  --  88.3 87.9 89.3 90.7  --   PLT 277  --  295 321 352 362  --    < > = values in this interval not displayed.   Basic Metabolic Panel: Recent Labs  Lab 07/07/19 0236 07/08/19 0415  07/09/19 0339 07/10/19 0252 07/11/19 0307  NA 145 142 143 142 142  K 3.9 3.4* 4.1 3.6 3.5  CL 109 104 109 110 108  CO2 25 26 26 25  21*  GLUCOSE 91 101* 103* 102* 105*  BUN 16 23 24* 21 23  CREATININE 0.97 1.17* 1.25* 1.27* 1.44*  CALCIUM 9.4 9.3 9.1 9.4 9.4  MG 1.9 1.8 1.9 1.8 1.7  PHOS 3.5 3.9 3.6 3.6 3.6   GFR: Estimated Creatinine Clearance: 26.9 mL/min (A) (by C-G formula based on SCr of 1.44 mg/dL (H)). Liver Function Tests: Recent Labs  Lab 07/07/19 0236 07/08/19 0415 07/09/19 0339 07/10/19 0252 07/11/19 0307  ALBUMIN 2.8* 2.8* 2.9* 3.0* 3.0*   No results for input(s): LIPASE, AMYLASE in the last 168 hours. No results for input(s): AMMONIA in the last 168 hours. Coagulation Profile: No results for input(s): INR, PROTIME in the last 168 hours. Cardiac Enzymes: No results for input(s): CKTOTAL, CKMB, CKMBINDEX, TROPONINI in the last 168 hours. BNP (last 3 results) No results for input(s): PROBNP in the last 8760 hours. HbA1C: No results for input(s): HGBA1C in the last 72 hours. CBG: Recent Labs  Lab 07/05/19 2005  GLUCAP 105*   Lipid Profile: No results for input(s): CHOL, HDL, LDLCALC, TRIG, CHOLHDL, LDLDIRECT in the last 72 hours. Thyroid Function Tests: No results for input(s): TSH, T4TOTAL, FREET4,  T3FREE, THYROIDAB in the last 72 hours. Anemia Panel: No results for input(s): VITAMINB12, FOLATE, FERRITIN, TIBC, IRON, RETICCTPCT in the last 72 hours. Sepsis Labs: No results for input(s): PROCALCITON, LATICACIDVEN in the last 168 hours.  Recent Results (from the past 240 hour(s))  Culture, blood (routine x 2)     Status: Abnormal   Collection Time: 07/01/19  2:31 PM   Specimen: BLOOD  Result Value Ref Range Status   Specimen Description BLOOD LEFT ANTECUBITAL  Final   Special Requests   Final    BOTTLES DRAWN AEROBIC AND ANAEROBIC Blood Culture results may not be optimal due to an inadequate volume of blood received in culture bottles   Culture   Setup Time   Final    GRAM NEGATIVE RODS ANAEROBIC BOTTLE ONLY CRITICAL VALUE NOTED.  VALUE IS CONSISTENT WITH PREVIOUSLY REPORTED AND CALLED VALUE.    Culture (A)  Final    ESCHERICHIA COLI SUSCEPTIBILITIES PERFORMED ON PREVIOUS CULTURE WITHIN THE LAST 5 DAYS. Performed at [facility], [place] [address], [place],  [address]    Report Status 07/05/2019 FINAL  Final  Culture, blood (routine x 2)     Status: Abnormal   Collection Time: 07/01/19  2:31 PM   Specimen: BLOOD LEFT ARM  Result Value Ref Range Status   Specimen Description BLOOD LEFT ARM  Final   Special Requests   Final    BOTTLES DRAWN AEROBIC AND ANAEROBIC Blood Culture adequate volume   Culture  Setup Time   Final    GRAM NEGATIVE RODS ANAEROBIC BOTTLE ONLY CRITICAL RESULT CALLED TO, READ BACK BY AND VERIFIED WITH: PHARMD G ABBOTT [id number] AT 62 AM BY CM Performed at [facility], [place] [address], [place], [place] [address]    Culture ESCHERICHIA COLI (A)  Final   Report Status 07/05/2019 FINAL  Final   Organism ID, Bacteria ESCHERICHIA COLI  Final      Susceptibility   Escherichia coli - MIC*    AMPICILLIN >=32 RESISTANT Resistant     CEFAZOLIN <=4 SENSITIVE Sensitive     CEFEPIME <=1 SENSITIVE Sensitive     CEFTAZIDIME <=1 SENSITIVE Sensitive     CEFTRIAXONE <=1 SENSITIVE Sensitive     CIPROFLOXACIN >=4 RESISTANT Resistant     GENTAMICIN <=1 SENSITIVE Sensitive     IMIPENEM <=0.25 SENSITIVE Sensitive     TRIMETH/SULFA >=320 RESISTANT Resistant     AMPICILLIN/SULBACTAM 8 SENSITIVE Sensitive     PIP/TAZO <=4 SENSITIVE Sensitive     * ESCHERICHIA COLI  Blood Culture ID Panel (Reflexed)     Status: Abnormal   Collection Time: 07/01/19  2:31 PM  Result Value Ref Range Status   Enterococcus species NOT DETECTED NOT DETECTED Final   Listeria monocytogenes NOT DETECTED NOT DETECTED Final   Staphylococcus species NOT DETECTED NOT DETECTED Final   Staphylococcus aureus (BCID) NOT DETECTED NOT DETECTED  Final   Streptococcus species NOT DETECTED NOT DETECTED Final   Streptococcus agalactiae NOT DETECTED NOT DETECTED Final   Streptococcus pneumoniae NOT DETECTED NOT DETECTED Final   Streptococcus pyogenes NOT DETECTED NOT DETECTED Final   Acinetobacter baumannii NOT DETECTED NOT DETECTED Final   Enterobacteriaceae species DETECTED (A) NOT DETECTED Final    Comment: Enterobacteriaceae represent a large family of gram-negative bacteria, not a single organism. CRITICAL RESULT CALLED TO, READ BACK BY AND VERIFIED WITH: PHARMD G ABBOTT [id number] AT 90 AM Y CM    Enterobacter cloacae complex NOT DETECTED NOT DETECTED Final  Escherichia coli DETECTED (A) NOT DETECTED Final    Comment: CRITICAL RESULT CALLED TO, READ BACK BY AND VERIFIED WITH: PHARMD G ABBOTT [id number] AT 638 AM BY CM    Klebsiella oxytoca NOT DETECTED NOT DETECTED Final   Klebsiella pneumoniae NOT DETECTED NOT DETECTED Final   Proteus species NOT DETECTED NOT DETECTED Final   Serratia marcescens NOT DETECTED NOT DETECTED Final   Carbapenem resistance NOT DETECTED NOT DETECTED Final   Haemophilus influenzae NOT DETECTED NOT DETECTED Final   Neisseria meningitidis NOT DETECTED NOT DETECTED Final   Pseudomonas aeruginosa NOT DETECTED NOT DETECTED Final   Candida albicans NOT DETECTED NOT DETECTED Final   Candida glabrata NOT DETECTED NOT DETECTED Final   Candida krusei NOT DETECTED NOT DETECTED Final   Candida parapsilosis NOT DETECTED NOT DETECTED Final   Candida tropicalis NOT DETECTED NOT DETECTED Final    Comment: Performed at [facility], [place] [address] [place] [address], [place], [place] [address]  SARS CORONAVIRUS 2 (TAT 6-24 HRS) Nasopharyngeal Nasopharyngeal Swab     Status: None   Collection Time: 07/01/19  4:44 PM   Specimen: Nasopharyngeal Swab  Result Value Ref Range Status   SARS Coronavirus 2 NEGATIVE NEGATIVE Final    Comment: (NOTE) SARS-CoV-2 target nucleic acids are NOT DETECTED. The SARS-CoV-2 RNA is generally  detectable in upper and lower respiratory specimens during the acute phase of infection. Negative results do not preclude SARS-CoV-2 infection, do not rule out co-infections with other pathogens, and should not be used as the sole basis for treatment or other patient management decisions. Negative results must be combined with clinical observations, patient history, and epidemiological information. The expected result is Negative. Fact Sheet for Patients: SugarRoll.be Fact Sheet for Healthcare Providers: https://www.woods-mathews.com/ This test is not yet approved or cleared by the [place] FDA and  has been authorized for detection and/or diagnosis of SARS-CoV-2 by FDA under an Emergency Use Authorization (EUA). This EUA will remain  in effect (meaning this test can be used) for the duration of the COVID-19 declaration under Section 56 4(b)(1) of the Act, 21 U.S.C. section 360bbb-3(b)(1), unless the authorization is terminated or revoked sooner. Performed at [facility], [place] [address], [place], [place] [address]   SARS CORONAVIRUS 2 (TAT 6-24 HRS) Nasopharyngeal Nasopharyngeal Swab     Status: None   Collection Time: 07/09/19  1:36 PM   Specimen: Nasopharyngeal Swab  Result Value Ref Range Status   SARS Coronavirus 2 NEGATIVE NEGATIVE Final    Comment: (NOTE) SARS-CoV-2 target nucleic acids are NOT DETECTED. The SARS-CoV-2 RNA is generally detectable in upper and lower respiratory specimens during the acute phase of infection. Negative results do not preclude SARS-CoV-2 infection, do not rule out co-infections with other pathogens, and should not be used as the sole basis for treatment or other patient management decisions. Negative results must be combined with clinical observations, patient history, and epidemiological information. The expected result is Negative. Fact Sheet for  Patients: SugarRoll.be Fact Sheet for Healthcare Providers: https://www.woods-mathews.com/ This test is not yet approved or cleared by the [place] FDA and  has been authorized for detection and/or diagnosis of SARS-CoV-2 by FDA under an Emergency Use Authorization (EUA). This EUA will remain  in effect (meaning this test can be used) for the duration of the COVID-19 declaration under Section 56 4(b)(1) of the Act, 21 U.S.C. section 360bbb-3(b)(1), unless the authorization is terminated or revoked sooner. Performed at [facility], [place],  [place] [address]          Radiology Studies: No results found.      Scheduled Meds: . calcium-vitamin D  1 tablet Oral QODAY  . Chlorhexidine Gluconate Cloth  6 each Topical Daily  . heparin  5,000 Units Subcutaneous Q8H  . letrozole  2.5 mg Oral Daily  . midodrine  10 mg Oral TID WC  . pantoprazole  40 mg Oral Daily   Continuous Infusions: . sodium chloride 100 mL/hr at 07/11/19 1059     LOS: 10 days    Time spent: 35 minutes.     Elmarie Shiley, MD [facility]   If 7PM-7AM, please contact night-coverage [email or web address]  07/11/2019, 2:18 PM

## 2019-07-11 NOTE — Progress Notes (Signed)
Patient extremely confused overnight.  Patient up and walking with foley bag dragging behind her.  Patient refusing to wear telemetry

## 2019-07-12 DIAGNOSIS — F0390 Unspecified dementia without behavioral disturbance: Secondary | ICD-10-CM | POA: Diagnosis not present

## 2019-07-12 DIAGNOSIS — Z66 Do not resuscitate: Secondary | ICD-10-CM | POA: Diagnosis not present

## 2019-07-12 DIAGNOSIS — T83511D Infection and inflammatory reaction due to indwelling urethral catheter, subsequent encounter: Secondary | ICD-10-CM | POA: Diagnosis not present

## 2019-07-12 DIAGNOSIS — N9912 Postprocedural urethral stricture, female: Secondary | ICD-10-CM | POA: Diagnosis not present

## 2019-07-12 DIAGNOSIS — Z923 Personal history of irradiation: Secondary | ICD-10-CM | POA: Diagnosis not present

## 2019-07-12 DIAGNOSIS — Y92239 Unspecified place in hospital as the place of occurrence of the external cause: Secondary | ICD-10-CM | POA: Diagnosis not present

## 2019-07-12 DIAGNOSIS — G9341 Metabolic encephalopathy: Secondary | ICD-10-CM | POA: Diagnosis not present

## 2019-07-12 DIAGNOSIS — Z7189 Other specified counseling: Secondary | ICD-10-CM | POA: Diagnosis not present

## 2019-07-12 DIAGNOSIS — N3090 Cystitis, unspecified without hematuria: Secondary | ICD-10-CM | POA: Diagnosis not present

## 2019-07-12 DIAGNOSIS — T83511A Infection and inflammatory reaction due to indwelling urethral catheter, initial encounter: Secondary | ICD-10-CM | POA: Diagnosis not present

## 2019-07-12 DIAGNOSIS — C44501 Unspecified malignant neoplasm of skin of breast: Secondary | ICD-10-CM | POA: Diagnosis not present

## 2019-07-12 DIAGNOSIS — R4182 Altered mental status, unspecified: Secondary | ICD-10-CM | POA: Diagnosis not present

## 2019-07-12 DIAGNOSIS — N390 Urinary tract infection, site not specified: Secondary | ICD-10-CM | POA: Diagnosis not present

## 2019-07-12 DIAGNOSIS — L89002 Pressure ulcer of unspecified elbow, stage 2: Secondary | ICD-10-CM | POA: Diagnosis not present

## 2019-07-12 DIAGNOSIS — R339 Retention of urine, unspecified: Secondary | ICD-10-CM | POA: Diagnosis not present

## 2019-07-12 DIAGNOSIS — G9340 Encephalopathy, unspecified: Secondary | ICD-10-CM | POA: Diagnosis not present

## 2019-07-12 DIAGNOSIS — R42 Dizziness and giddiness: Secondary | ICD-10-CM | POA: Diagnosis not present

## 2019-07-12 DIAGNOSIS — Z888 Allergy status to other drugs, medicaments and biological substances status: Secondary | ICD-10-CM | POA: Diagnosis not present

## 2019-07-12 DIAGNOSIS — I10 Essential (primary) hypertension: Secondary | ICD-10-CM | POA: Diagnosis not present

## 2019-07-12 DIAGNOSIS — I2510 Atherosclerotic heart disease of native coronary artery without angina pectoris: Secondary | ICD-10-CM | POA: Diagnosis not present

## 2019-07-12 DIAGNOSIS — E11649 Type 2 diabetes mellitus with hypoglycemia without coma: Secondary | ICD-10-CM | POA: Diagnosis not present

## 2019-07-12 DIAGNOSIS — Z853 Personal history of malignant neoplasm of breast: Secondary | ICD-10-CM | POA: Diagnosis not present

## 2019-07-12 DIAGNOSIS — Z880 Allergy status to penicillin: Secondary | ICD-10-CM | POA: Diagnosis not present

## 2019-07-12 DIAGNOSIS — R6520 Severe sepsis without septic shock: Secondary | ICD-10-CM | POA: Diagnosis not present

## 2019-07-12 DIAGNOSIS — R4020 Unspecified coma: Secondary | ICD-10-CM | POA: Diagnosis not present

## 2019-07-12 DIAGNOSIS — R488 Other symbolic dysfunctions: Secondary | ICD-10-CM | POA: Diagnosis not present

## 2019-07-12 DIAGNOSIS — M6281 Muscle weakness (generalized): Secondary | ICD-10-CM | POA: Diagnosis not present

## 2019-07-12 DIAGNOSIS — I951 Orthostatic hypotension: Secondary | ICD-10-CM | POA: Diagnosis not present

## 2019-07-12 DIAGNOSIS — K2101 Gastro-esophageal reflux disease with esophagitis, with bleeding: Secondary | ICD-10-CM | POA: Diagnosis not present

## 2019-07-12 DIAGNOSIS — Y846 Urinary catheterization as the cause of abnormal reaction of the patient, or of later complication, without mention of misadventure at the time of the procedure: Secondary | ICD-10-CM | POA: Diagnosis present

## 2019-07-12 DIAGNOSIS — Z87440 Personal history of urinary (tract) infections: Secondary | ICD-10-CM | POA: Diagnosis not present

## 2019-07-12 DIAGNOSIS — N132 Hydronephrosis with renal and ureteral calculous obstruction: Secondary | ICD-10-CM | POA: Diagnosis not present

## 2019-07-12 DIAGNOSIS — W19XXXA Unspecified fall, initial encounter: Secondary | ICD-10-CM | POA: Diagnosis not present

## 2019-07-12 DIAGNOSIS — R55 Syncope and collapse: Secondary | ICD-10-CM | POA: Diagnosis not present

## 2019-07-12 DIAGNOSIS — L89302 Pressure ulcer of unspecified buttock, stage 2: Secondary | ICD-10-CM | POA: Diagnosis not present

## 2019-07-12 DIAGNOSIS — D649 Anemia, unspecified: Secondary | ICD-10-CM | POA: Diagnosis not present

## 2019-07-12 DIAGNOSIS — A419 Sepsis, unspecified organism: Secondary | ICD-10-CM | POA: Diagnosis not present

## 2019-07-12 DIAGNOSIS — E569 Vitamin deficiency, unspecified: Secondary | ICD-10-CM | POA: Diagnosis not present

## 2019-07-12 DIAGNOSIS — I959 Hypotension, unspecified: Secondary | ICD-10-CM | POA: Diagnosis not present

## 2019-07-12 DIAGNOSIS — K449 Diaphragmatic hernia without obstruction or gangrene: Secondary | ICD-10-CM | POA: Diagnosis not present

## 2019-07-12 DIAGNOSIS — E785 Hyperlipidemia, unspecified: Secondary | ICD-10-CM | POA: Diagnosis not present

## 2019-07-12 DIAGNOSIS — Z978 Presence of other specified devices: Secondary | ICD-10-CM | POA: Diagnosis not present

## 2019-07-12 DIAGNOSIS — F05 Delirium due to known physiological condition: Secondary | ICD-10-CM | POA: Diagnosis not present

## 2019-07-12 DIAGNOSIS — F0150 Vascular dementia without behavioral disturbance: Secondary | ICD-10-CM | POA: Diagnosis not present

## 2019-07-12 DIAGNOSIS — E162 Hypoglycemia, unspecified: Secondary | ICD-10-CM | POA: Diagnosis not present

## 2019-07-12 DIAGNOSIS — Z515 Encounter for palliative care: Secondary | ICD-10-CM | POA: Diagnosis not present

## 2019-07-12 DIAGNOSIS — N179 Acute kidney failure, unspecified: Secondary | ICD-10-CM | POA: Diagnosis not present

## 2019-07-12 DIAGNOSIS — R498 Other voice and resonance disorders: Secondary | ICD-10-CM | POA: Diagnosis not present

## 2019-07-12 DIAGNOSIS — Z20822 Contact with and (suspected) exposure to covid-19: Secondary | ICD-10-CM | POA: Diagnosis not present

## 2019-07-12 DIAGNOSIS — R1310 Dysphagia, unspecified: Secondary | ICD-10-CM | POA: Diagnosis not present

## 2019-07-12 DIAGNOSIS — R0902 Hypoxemia: Secondary | ICD-10-CM | POA: Diagnosis not present

## 2019-07-12 DIAGNOSIS — H532 Diplopia: Secondary | ICD-10-CM | POA: Diagnosis not present

## 2019-07-12 LAB — BASIC METABOLIC PANEL
Anion gap: 7 (ref 5–15)
BUN: 17 mg/dL (ref 8–23)
CO2: 23 mmol/L (ref 22–32)
Calcium: 9.2 mg/dL (ref 8.9–10.3)
Chloride: 115 mmol/L — ABNORMAL HIGH (ref 98–111)
Creatinine, Ser: 1.19 mg/dL — ABNORMAL HIGH (ref 0.44–1.00)
GFR calc Af Amer: 50 mL/min — ABNORMAL LOW (ref >60)
GFR calc non Af Amer: 43 mL/min — ABNORMAL LOW (ref >60)
Glucose, Bld: 91 mg/dL (ref 70–99)
Potassium: 3.4 mmol/L — ABNORMAL LOW (ref 3.5–5.1)
Sodium: 145 mmol/L (ref 135–145)

## 2019-07-12 MED ORDER — HALOPERIDOL LACTATE 5 MG/ML IJ SOLN: 0.5000 mg | Freq: Once | INTRAMUSCULAR | Status: DC

## 2019-07-12 MED ORDER — HALOPERIDOL 0.5 MG PO TABS
0.5000 mg | ORAL_TABLET | Freq: Every day | ORAL | 0 refills | Status: DC | PRN
Start: 2019-07-12 — End: 2019-08-07

## 2019-07-12 MED ORDER — POTASSIUM CHLORIDE CRYS ER 20 MEQ PO TBCR
40.0000 meq | EXTENDED_RELEASE_TABLET | Freq: Once | ORAL | Status: AC
  Administered 2019-07-12: 40 meq via ORAL
  Filled 2019-07-12: qty 2

## 2019-07-12 NOTE — Plan of Care (Signed)
Care plan goals met. Pt appropriate for discharge.

## 2019-07-12 NOTE — Progress Notes (Signed)
Patient with increased agitation. Legs over bed railings several times, bed alarm on. Patient repositioned in bed. Patient confused, wanting to get her car. Swatting at staff. PRN haldol administered. Will continue to monitor.

## 2019-07-12 NOTE — Progress Notes (Signed)
Patient was stable at discharge. I removed her IV. Pt's son at bedside. We reviewed the discharge education. Patient & Family verbalized understanding and had no further questions. Patient left with belongings in hand.   RN gave report to [employer] at facility.

## 2019-07-12 NOTE — Discharge Summary (Addendum)
Physician Discharge Summary  [name] [name] [id number] DOB: Jul 09, 1940 DOA: 07/01/2019  PCP: Jerrol Banana., MD  Admit date: 07/01/2019 Discharge date: 07/12/2019  Admitted From: Home Disposition: SNF  Recommendations for Outpatient Follow-up:  1. Follow ups as below. 2. Orthostatic vitals twice a day 3. Fall and delirium precautions 4. Outpatient follow-up with urology for voiding trial in 1 week 5. Please obtain CBC/BMP/Mag in 1 week    Discharge Condition: Stable.  CODE STATUS: DNR Diet recommendation: Heart Healthy  Brief/Interim Summary: 79 year old with past medical history significant for breast cancer, CAD, hypertension and dementia presents with recurrent syncopal episode.  She was admitted for sepsis due to E. coli bacteremia and Klebsiella UTI.  She was treated with ceftriaxone and transition to Ancef based on sensitivity.  She was previously hospitalized with AKI in the setting of ureteral obstruction requiring Foley catheter about a month ago.  At that time Foley was discontinued on discharge.  However, she was found to have acute urinary retention when she presented to the urology office for follow-up prior to this admission.  Recommendation was to follow-up with urology 1 week after discharge.  This admission patient was found to have E. coli and Klebsiella UTI as well as E. coli bacteremia.  She completed 7 days of antibiotics course on 4/30 Cardiology consulted on admission and recommended outpatient follow-up and signed off.  Patient noted to have significant orthostatic hypotension.  Echo and carotid ultrasound unrevealing.  Patient was a started on midodrine but continued to have orthostatic hypotension likely due to autonomic dysregulation.  Patient will be discharged on midodrine.  Home lisinopril has been discontinued.  As needed hydralazine for with parameters based on his standing blood pressure.  Recommended frequent orientation and delirium and fall  precautions.  1-Severe sepsis due to catheter associated E. coli UTI and bacteremia, and Klebsiella UTI: POA.  Evidence of endorgan damage including confusion, syncope, hypotension and lactic acidosis on admission.  Sepsis has resolved. -Patient completed 7 days course of antibiotics on 4/30 ceftriaxone and Omnicef  2-Recurrent syncope/orthostatic hypotension: Continues to have significant orthostasis. Could be related to autonomic dysregulation -Outpatient follow-up with cardiology -Continue with TED hose -Continue with midodrine 10 mg 3 times a day  3-Hypertension: Complicated by orthostatic hypotension: Discontinue lisinopril As needed hydralazine for standing systolic blood pressure above 160 and standing diastolic above [id number]  4-Ureteral obstruction indwelling Foley catheter inserted by urology He will need to be discharged with Foley catheter and follow-up with urology in 1 week  5-AKI on CKD stage III AAA Improved with IV fluids.   6-Hypokalemia: Replete  7-Shortened PR interval: Outpatient follow-up with cardiology 8-History of breast cancer: Continue with Femara 9-Cognitive impairment/dementia without behavioral disturbance: Frequent orientation and delirium precaution  Estimated body mass index is 23.17 kg/m as calculated from the following:   Height as of this encounter: 5\' 4"  (1.626 m).   Weight as of this encounter: 61.2 kg.   Discharge Diagnoses:  Active Problems:   Urinary tract infection associated with indwelling urethral catheter ([place])   Sepsis [facility])    Discharge Instructions  Discharge Instructions    Diet - low sodium heart healthy   Complete by: As directed    Diet general   Complete by: As directed    Increase activity slowly   Complete by: As directed    Increase activity slowly   Complete by: As directed      Allergies as of 07/12/2019      Reactions  Celebrex  [celecoxib]    Dark stools.   Penicillins Other (See Comments)    Childhood reaction Has patient had a PCN reaction causing immediate rash, facial/tongue/throat swelling, SOB or lightheadedness with hypotension: Unknown Has patient had a PCN reaction causing severe rash involving mucus membranes or skin necrosis: Unknown Has patient had a PCN reaction that required hospitalization: Unknown Has patient had a PCN reaction occurring within the last 10 years: Unknown If all of the above answers are "NO", then may proceed with Cephalosporin use.   Prevacid [lansoprazole] Diarrhea      Medication List    STOP taking these medications   lisinopril 20 MG tablet Commonly known as: ZESTRIL     TAKE these medications   acetaminophen 650 MG CR tablet Commonly known as: TYLENOL Take 1,300 mg by mouth every 8 (eight) hours as needed for pain.   CALCIUM 500+D PO Take by mouth every other day.   haloperidol 0.5 MG tablet Commonly known as: HALDOL Take 1 tablet (0.5 mg total) by mouth daily as needed for agitation.   hydrALAZINE 25 MG tablet Commonly known as: APRESOLINE Take 1 tablet (25 mg total) by mouth every 12 (twelve) hours as needed (For standing SBP above 160 or standing DBP above 100).   letrozole 2.5 MG tablet Commonly known as: FEMARA Take 2.5 mg by mouth daily.   midodrine 10 MG tablet Commonly known as: PROAMATINE Take 1 tablet (10 mg total) by mouth 3 (three) times daily with meals.   omeprazole 20 MG capsule Commonly known as: PRILOSEC TAKE 1 CAPSULE (20 MG TOTAL) BY MOUTH DAILY AS NEEDED (INDIGESTION). What changed: See the new instructions.   senna-docusate 8.6-50 MG tablet Commonly known as: Senokot-S Take 2 tablets by mouth 2 (two) times daily as needed for mild constipation.      Contact information for after-discharge care    Destination    [facility] Preferred SNF .   Service: Skilled Nursing Contact information: [place] [phone]              Allergies  Allergen Reactions  . Celebrex  [Celecoxib]     Dark stools.  . Penicillins Other (See Comments)    Childhood reaction Has patient had a PCN reaction causing immediate rash, facial/tongue/throat swelling, SOB or lightheadedness with hypotension: Unknown Has patient had a PCN reaction causing severe rash involving mucus membranes or skin necrosis: Unknown Has patient had a PCN reaction that required hospitalization: Unknown Has patient had a PCN reaction occurring within the last 10 years: Unknown If all of the above answers are "NO", then may proceed with Cephalosporin use.   Marland Kitchen Prevacid [Lansoprazole] Diarrhea    Consultations:     Procedures/Studies: CT Head Wo Contrast  Result Date: 07/01/2019 CLINICAL DATA:  Altered mental status. EXAM: CT HEAD WITHOUT CONTRAST TECHNIQUE: Contiguous axial images were obtained from the base of the skull through the vertex without intravenous contrast. COMPARISON:  Head CT 08/18/2014 FINDINGS: Brain: Stable age related cerebral atrophy, ventriculomegaly and periventricular white matter disease. No extra-axial fluid collections are identified. No CT findings for acute hemispheric infarction or intracranial hemorrhage. No mass lesions. The brainstem and cerebellum are normal. Vascular: Vascular calcifications but no hyperdense vessels or obvious aneurysm. Skull: No skull fracture or bone lesions. Sinuses/Orbits: The paranasal sinuses and mastoid air cells are clear. The globes are intact. Other: No scalp lesions or hematoma. IMPRESSION: 1. Stable age related cerebral atrophy, ventriculomegaly and periventricular white  matter disease. 2. No acute intracranial findings or mass lesions. Electronically Signed   By: Marijo Sanes M.D.   On: 07/01/2019 13:43   DG Chest Portable 1 View  Result Date: 07/01/2019 CLINICAL DATA:  Altered mental status EXAM: PORTABLE CHEST 1 VIEW COMPARISON:  06/30/2019 FINDINGS: The heart size and mediastinal contours  are within normal limits. Hiatal hernia. Both lungs are clear. The visualized skeletal structures are unremarkable. IMPRESSION: 1.  No acute abnormality of the lungs. 2.  Hiatal hernia. Electronically Signed   By: Eddie Candle M.D.   On: 07/01/2019 13:19   DG Chest Port 1 View  Result Date: 06/30/2019 CLINICAL DATA:  Patient not feeling well. EXAM: PORTABLE CHEST 1 VIEW COMPARISON:  June 11, 2019 FINDINGS: Moderate to large left hiatal hernia. The heart, hila, mediastinum are normal. No pneumothorax. No pulmonary nodules or masses. No focal infiltrates. IMPRESSION: Moderate to large hiatal hernia.  No other abnormalities. Electronically Signed   By: Dorise Bullion III M.D   On: 06/30/2019 12:15   ECHOCARDIOGRAM COMPLETE  Result Date: 07/06/2019    ECHOCARDIOGRAM REPORT   Patient Name:   SUSSAN BOHNE Date of Exam: 07/06/2019 Medical Rec #:  [id number]         Height:       64.0 in Accession #:    [id number]        Weight:       135.0 lb Date of Birth:  Jul 17, 1940         BSA:          1.655 m Patient Age:    79 years          BP:           173/90 mmHg Patient Gender: F                 HR:           76 bpm. Exam Location:  Inpatient Procedure: 2D Echo, Cardiac Doppler and Color Doppler Indications:    Syncope 780.2/R55  History:        Patient has prior history of Echocardiogram examinations, most                 recent 09/10/2018. Previous Myocardial Infarction and CAD; Risk                 Factors:Diabetes and Hypertension.  Sonographer:    Clayton Lefort RDCS (AE) Referring Phys: [id number] [facility]  Sonographer Comments: Suboptimal parasternal window and no subcostal window. Limited patient mobility. IMPRESSIONS  1. Left ventricular ejection fraction, by estimation, is 60 to 65%. The left ventricle has normal function. The left ventricle has no regional wall motion abnormalities. There is mild left ventricular hypertrophy. Left ventricular diastolic parameters are indeterminate.  2. Right ventricular  systolic function is normal. The right ventricular size is normal.  3. The mitral valve is degenerative. Trivial mitral valve regurgitation. No evidence of mitral stenosis.  4. The aortic valve is tricuspid. Aortic valve regurgitation is trivial. Mild aortic valve stenosis.  5. The inferior vena cava is normal in size with greater than 50% respiratory variability, suggesting right atrial pressure of 3 mmHg. FINDINGS  Left Ventricle: Left ventricular ejection fraction, by estimation, is 60 to 65%. The left ventricle has normal function. The left ventricle has no regional wall motion abnormalities. The left ventricular internal cavity size was normal in size. There is  mild left ventricular hypertrophy. Left ventricular diastolic parameters are indeterminate.  Right Ventricle: The right ventricular size is normal. No increase in right ventricular wall thickness. Right ventricular systolic function is normal. Left Atrium: Left atrial size was normal in size. Right Atrium: Right atrial size was normal in size. Pericardium: Trivial pericardial effusion is present. The pericardial effusion is localized near the right atrium. Mitral Valve: The mitral valve is degenerative in appearance. There is moderate thickening of the mitral valve leaflet(s). There is moderate calcification of the mitral valve leaflet(s). Normal mobility of the mitral valve leaflets. Moderate mitral annular calcification. Trivial mitral valve regurgitation. No evidence of mitral valve stenosis. MV peak gradient, 6.6 mmHg. The mean mitral valve gradient is 2.0 mmHg. Tricuspid Valve: The tricuspid valve is normal in structure. Tricuspid valve regurgitation is mild . No evidence of tricuspid stenosis. Aortic Valve: The aortic valve is tricuspid. . There is moderate thickening and moderate calcification of the aortic valve. Aortic valve regurgitation is trivial. Mild aortic stenosis is present. There is moderate thickening of the aortic valve. There is   moderate calcification of the aortic valve. Aortic valve mean gradient measures 4.3 mmHg. Aortic valve peak gradient measures 9.5 mmHg. Aortic valve area, by VTI measures 1.43 cm. Pulmonic Valve: The pulmonic valve was normal in structure. Pulmonic valve regurgitation is not visualized. No evidence of pulmonic stenosis. Aorta: The aortic root is normal in size and structure. Venous: The inferior vena cava is normal in size with greater than 50% respiratory variability, suggesting right atrial pressure of 3 mmHg. IAS/Shunts: No atrial level shunt detected by color flow Doppler.  LEFT VENTRICLE PLAX 2D LVIDd:         2.96 cm  Diastology LVIDs:         2.13 cm  LV e' lateral:   3.53 cm/s LV PW:         1.31 cm  LV E/e' lateral: 24.8 LV IVS:        1.22 cm  LV e' medial:    3.45 cm/s LVOT diam:     1.60 cm  LV E/e' medial:  25.4 LV SV:         44 LV SV Index:   26 LVOT Area:     2.01 cm  RIGHT VENTRICLE RV Basal diam:  2.35 cm RV S prime:     14.90 cm/s TAPSE (M-mode): 2.0 cm LEFT ATRIUM             Index       RIGHT ATRIUM           Index LA diam:        3.00 cm 1.81 cm/m  RA Area:     10.20 cm LA Vol (A2C):   47.5 ml 28.69 ml/m RA Volume:   18.10 ml  10.93 ml/m LA Vol (A4C):   39.4 ml 23.80 ml/m LA Biplane Vol: 46.3 ml 27.97 ml/m  AORTIC VALVE AV Area (Vmax):    1.34 cm AV Area (Vmean):   1.38 cm AV Area (VTI):     1.43 cm AV Vmax:           154.33 cm/s AV Vmean:          100.733 cm/s AV VTI:            0.306 m AV Peak Grad:      9.5 mmHg AV Mean Grad:      4.3 mmHg LVOT Vmax:         103.00 cm/s LVOT Vmean:  69.000 cm/s LVOT VTI:          0.218 m LVOT/AV VTI ratio: 0.71  AORTA Ao Root diam: 2.80 cm MITRAL VALVE                TRICUSPID VALVE MV Area (PHT): 2.52 cm     TR Peak grad:   29.2 mmHg MV Peak grad:  6.6 mmHg     TR Vmax:        270.00 cm/s MV Mean grad:  2.0 mmHg MV Vmax:       1.28 m/s     SHUNTS MV Vmean:      66.8 cm/s    Systemic VTI:  0.22 m MV Decel Time: 301 msec     Systemic Diam:  1.60 cm MV E velocity: 87.50 cm/s MV A velocity: 148.00 cm/s MV E/A ratio:  0.59 Jenkins Rouge MD Electronically signed by Jenkins Rouge MD Signature Date/Time: 07/06/2019/10:55:24 AM    Final    VAS US CAROTID  Result Date: 07/07/2019 Carotid Arterial Duplex Study Indications: Syncope. Performing Technologist: June Leap RDMS, RVT  Examination Guidelines: A complete evaluation includes B-mode imaging, spectral Doppler, color Doppler, and power Doppler as needed of all accessible portions of each vessel. Bilateral testing is considered an integral part of a complete examination. Limited examinations for reoccurring indications may be performed as noted.  Right Carotid Findings: +----------+--------+--------+--------+-------------------------+--------+           PSV cm/sEDV cm/sStenosisPlaque Description       Comments +----------+--------+--------+--------+-------------------------+--------+ CCA Prox  75      16                                                +----------+--------+--------+--------+-------------------------+--------+ CCA Distal44      10                                                +----------+--------+--------+--------+-------------------------+--------+ ICA Prox  44      16      1-39%   heterogenous and calcific         +----------+--------+--------+--------+-------------------------+--------+ ICA Distal60      24                                                +----------+--------+--------+--------+-------------------------+--------+ ECA       85      4                                                 +----------+--------+--------+--------+-------------------------+--------+ +----------+--------+-------+----------------+-------------------+           PSV cm/sEDV cmsDescribe        Arm Pressure (mmHG) +----------+--------+-------+----------------+-------------------+ [id number]            Multiphasic, WNL                     +----------+--------+-------+----------------+-------------------+ +---------+--------+--+--------+--+---------+ VertebralPSV cm/s53EDV cm/s16Antegrade +---------+--------+--+--------+--+---------+  Left Carotid Findings: +----------+--------+--------+--------+------------------+--------+  PSV cm/sEDV cm/sStenosisPlaque DescriptionComments +----------+--------+--------+--------+------------------+--------+ CCA Prox  72      18                                         +----------+--------+--------+--------+------------------+--------+ CCA Distal57      11                                         +----------+--------+--------+--------+------------------+--------+ ICA Prox  41      11      1-39%   heterogenous               +----------+--------+--------+--------+------------------+--------+ ICA Distal71      25                                         +----------+--------+--------+--------+------------------+--------+ ECA       58      7                                          +----------+--------+--------+--------+------------------+--------+ +----------+--------+--------+------------+-------------------+           PSV cm/sEDV cm/sDescribe    Arm Pressure (mmHG) +----------+--------+--------+------------+-------------------+ Subclavian                Not assessed                    +----------+--------+--------+------------+-------------------+ +---------+--------+--------+--------------+ VertebralPSV cm/sEDV cm/sNot identified +---------+--------+--------+--------------+   Summary: Right Carotid: Velocities in the right ICA are consistent with a 1-39% stenosis. Left Carotid: Velocities in the left ICA are consistent with a 1-39% stenosis.  *See table(s) above for measurements and observations.  Electronically signed by Monica Martinez MD on 07/07/2019 at 12:51:35 PM.    Final     Subjective: Alert, calm. Willing to eat breakfast. Was  agitated last night.   Discharge Exam: Vitals:   07/11/19 2141 07/12/19 0909  BP: (!) 149/71 (!) 166/81  Pulse: 79 76  Resp:  14  Temp: 98.4 F (36.9 C) 98.2 F (36.8 C)  SpO2: 97% 99%     General: Pt is alert, awake, not in acute distress Cardiovascular: RRR, S1/S2 +, no rubs, no gallops Respiratory: CTA bilaterally, no wheezing, no rhonchi Abdominal: Soft, NT, ND, bowel sounds + Extremities: no edema, no cyanosis    The results of significant diagnostics from this hospitalization (including imaging, microbiology, ancillary and laboratory) are listed below for reference.     Microbiology: Recent Results (from the past 240 hour(s))  SARS CORONAVIRUS 2 (TAT 6-24 HRS) Nasopharyngeal Nasopharyngeal Swab     Status: None   Collection Time: 07/09/19  1:36 PM   Specimen: Nasopharyngeal Swab  Result Value Ref Range Status   SARS Coronavirus 2 NEGATIVE NEGATIVE Final    Comment: (NOTE) SARS-CoV-2 target nucleic acids are NOT DETECTED. The SARS-CoV-2 RNA is generally detectable in upper and lower respiratory specimens during the acute phase of infection. Negative results do not preclude SARS-CoV-2 infection, do not rule out co-infections with other pathogens, and should not be used as the sole basis for treatment or other patient management decisions. Negative results must be combined  with clinical observations, patient history, and epidemiological information. The expected result is Negative. Fact Sheet for Patients: SugarRoll.be Fact Sheet for Healthcare Providers: https://www.woods-mathews.com/ This test is not yet approved or cleared by the [place] FDA and  has been authorized for detection and/or diagnosis of SARS-CoV-2 by FDA under an Emergency Use Authorization (EUA). This EUA will remain  in effect (meaning this test can be used) for the duration of the COVID-19 declaration under Section 56 4(b)(1) of the Act, 21  U.S.C. section 360bbb-3(b)(1), unless the authorization is terminated or revoked sooner. Performed at [facility], [place] [address], [place], [place] [address]      Labs: BNP (last 3 results) No results for input(s): BNP in the last 8760 hours. Basic Metabolic Panel: Recent Labs  Lab 07/07/19 0236 07/07/19 0236 07/08/19 0415 07/09/19 0339 07/10/19 0252 07/11/19 0307 07/12/19 0830  NA 145   < > 142 143 142 142 145  K 3.9   < > 3.4* 4.1 3.6 3.5 3.4*  CL 109   < > 104 109 110 108 115*  CO2 25   < > 26 26 25  21* 23  GLUCOSE 91   < > 101* 103* 102* 105* 91  BUN 16   < > 23 24* 21 23 17   CREATININE 0.97   < > 1.17* 1.25* 1.27* 1.44* 1.19*  CALCIUM 9.4   < > 9.3 9.1 9.4 9.4 9.2  MG 1.9  --  1.8 1.9 1.8 1.7  --   PHOS 3.5  --  3.9 3.6 3.6 3.6  --    < > = values in this interval not displayed.   Liver Function Tests: Recent Labs  Lab 07/07/19 0236 07/08/19 0415 07/09/19 0339 07/10/19 0252 07/11/19 0307  ALBUMIN 2.8* 2.8* 2.9* 3.0* 3.0*   No results for input(s): LIPASE, AMYLASE in the last 168 hours. No results for input(s): AMMONIA in the last 168 hours. CBC: Recent Labs  Lab 07/06/19 0259 07/07/19 0236 07/08/19 0415 07/09/19 0339 07/10/19 0252  WBC 7.7 8.0 8.8 7.5  --   HGB 10.0* 10.4* 10.2* 9.9* 10.3*  HCT 32.4* 34.0* 33.3* 33.0* 33.0*  MCV 88.3 87.9 89.3 90.7  --   PLT 295 321 352 362  --    Cardiac Enzymes: No results for input(s): CKTOTAL, CKMB, CKMBINDEX, TROPONINI in the last 168 hours. BNP: Invalid input(s): POCBNP CBG: Recent Labs  Lab 07/05/19 2005  GLUCAP 105*   D-Dimer No results for input(s): DDIMER in the last 72 hours. Hgb A1c No results for input(s): HGBA1C in the last 72 hours. Lipid Profile No results for input(s): CHOL, HDL, LDLCALC, TRIG, CHOLHDL, LDLDIRECT in the last 72 hours. Thyroid function studies No results for input(s): TSH, T4TOTAL, T3FREE, THYROIDAB in the last 72 hours.  Invalid input(s): FREET3 Anemia work  up No results for input(s): VITAMINB12, FOLATE, FERRITIN, TIBC, IRON, RETICCTPCT in the last 72 hours. Urinalysis    Component Value Date/Time   COLORURINE AMBER (A) 07/01/2019 1254   APPEARANCEUR CLOUDY (A) 07/01/2019 1254   APPEARANCEUR Cloudy (A) 04/25/2015 1449   LABSPEC 1.011 07/01/2019 1254   LABSPEC 1.021 01/17/2014 1524   PHURINE 6.0 07/01/2019 1254   GLUCOSEU NEGATIVE 07/01/2019 1254   GLUCOSEU Negative 01/17/2014 1524   HGBUR SMALL (A) 07/01/2019 1254   BILIRUBINUR NEGATIVE 07/01/2019 1254   BILIRUBINUR neg 06/30/2016 1619   BILIRUBINUR Negative 04/25/2015 1449   BILIRUBINUR Negative 01/17/2014 1524   KETONESUR NEGATIVE 07/01/2019 1254   PROTEINUR 100 (A)  07/01/2019 1254   UROBILINOGEN 0.2 06/30/2016 1619   NITRITE NEGATIVE 07/01/2019 1254   LEUKOCYTESUR LARGE (A) 07/01/2019 1254   LEUKOCYTESUR 2+ 01/17/2014 1524   Sepsis Labs Invalid input(s): PROCALCITONIN,  WBC,  LACTICIDVEN Microbiology Recent Results (from the past 240 hour(s))  SARS CORONAVIRUS 2 (TAT 6-24 HRS) Nasopharyngeal Nasopharyngeal Swab     Status: None   Collection Time: 07/09/19  1:36 PM   Specimen: Nasopharyngeal Swab  Result Value Ref Range Status   SARS Coronavirus 2 NEGATIVE NEGATIVE Final    Comment: (NOTE) SARS-CoV-2 target nucleic acids are NOT DETECTED. The SARS-CoV-2 RNA is generally detectable in upper and lower respiratory specimens during the acute phase of infection. Negative results do not preclude SARS-CoV-2 infection, do not rule out co-infections with other pathogens, and should not be used as the sole basis for treatment or other patient management decisions. Negative results must be combined with clinical observations, patient history, and epidemiological information. The expected result is Negative. Fact Sheet for Patients: SugarRoll.be Fact Sheet for Healthcare Providers: https://www.woods-mathews.com/ This test is not yet approved  or cleared by the [place] FDA and  has been authorized for detection and/or diagnosis of SARS-CoV-2 by FDA under an Emergency Use Authorization (EUA). This EUA will remain  in effect (meaning this test can be used) for the duration of the COVID-19 declaration under Section 56 4(b)(1) of the Act, 21 U.S.C. section 360bbb-3(b)(1), unless the authorization is terminated or revoked sooner. Performed at [facility], [place] [address] [place], [place] [address]      Time coordinating discharge: 40 minutes  SIGNED:   Elmarie Shiley, MD  [facility]

## 2019-07-12 NOTE — TOC Transition Note (Signed)
Transition of Care [facility] - CM/SW Discharge Note   Patient Details  Name: [name] [name] MRN: [id number] Date of Birth: Apr 12, 1940  Transition of Care [facility]) CM/SW Contact:  Bethann Berkshire, [place] Phone Number: 07/12/2019, 11:12 AM   Clinical Narrative:     Patient will DC to: [facility] [place]  Anticipated DC date: 07/12/19 Family notified: Devine,Larry C (Spouse) and Carberry, [name] (Son) Transport by: Corey Harold   Per MD patient ready for DC to [facility]. RN, patient, patient's family, and facility notified of DC. Discharge Summary and FL2 sent to facility. RN to call report prior to discharge [phone]). DC packet on chart. Ambulance transport requested for patient.   CSW will sign off for now as social work intervention is no longer needed. Please consult [place] again if new needs arise.   Final next level of care: Skilled Nursing Facility Barriers to Discharge: No Barriers Identified   Patient Goals and CMS Choice   CMS Medicare.gov Compare Post Acute Care list provided to:: Patient Represenative (must comment)(Larry Deblasi) Choice offered to / list presented to : Spouse  Discharge Placement              Patient chooses bed at: [facility]  Patient to be transferred to facility by: [place] Name of family member notified: [name] Kite Patient and family notified of of transfer: 07/10/19  Discharge Plan and Services                                     Social Determinants of Health (SDOH) Interventions     Readmission Risk Interventions Readmission Risk Prevention Plan 07/04/2019  Transportation Screening Complete  PCP or Specialist Appt within 3-5 Days Complete  HRI or Home Care Consult Complete  Medication Review (RN Care Manager) Referral to Pharmacy  Some recent data might be hidden

## 2019-07-14 DIAGNOSIS — I959 Hypotension, unspecified: Secondary | ICD-10-CM | POA: Diagnosis not present

## 2019-07-14 DIAGNOSIS — I10 Essential (primary) hypertension: Secondary | ICD-10-CM | POA: Diagnosis not present

## 2019-07-14 DIAGNOSIS — R55 Syncope and collapse: Secondary | ICD-10-CM | POA: Diagnosis not present

## 2019-07-14 DIAGNOSIS — N132 Hydronephrosis with renal and ureteral calculous obstruction: Secondary | ICD-10-CM | POA: Diagnosis not present

## 2019-07-14 DIAGNOSIS — M6281 Muscle weakness (generalized): Secondary | ICD-10-CM | POA: Diagnosis not present

## 2019-07-14 DIAGNOSIS — Z853 Personal history of malignant neoplasm of breast: Secondary | ICD-10-CM | POA: Diagnosis not present

## 2019-07-19 DIAGNOSIS — R55 Syncope and collapse: Secondary | ICD-10-CM | POA: Diagnosis not present

## 2019-07-19 DIAGNOSIS — H532 Diplopia: Secondary | ICD-10-CM | POA: Diagnosis not present

## 2019-07-19 DIAGNOSIS — R42 Dizziness and giddiness: Secondary | ICD-10-CM | POA: Diagnosis not present

## 2019-07-24 ENCOUNTER — Inpatient Hospital Stay
Admission: EM | Admit: 2019-07-24 | Discharge: 2019-08-07 | DRG: 698 | Disposition: A | Discharge status: Hospice | Payer: PPO | Attending: Internal Medicine | Admitting: Internal Medicine

## 2019-07-24 ENCOUNTER — Encounter: Payer: Self-pay | Admitting: Emergency Medicine

## 2019-07-24 ENCOUNTER — Other Ambulatory Visit: Payer: Self-pay

## 2019-07-24 ENCOUNTER — Encounter: Payer: Self-pay | Admitting: Physician Assistant

## 2019-07-24 ENCOUNTER — Emergency Department: Payer: PPO

## 2019-07-24 ENCOUNTER — Ambulatory Visit (INDEPENDENT_AMBULATORY_CARE_PROVIDER_SITE_OTHER): Payer: PPO | Admitting: Physician Assistant

## 2019-07-24 VITALS — BP 100/72 | HR 69 | Ht 65.0 in | Wt 135.0 lb

## 2019-07-24 DIAGNOSIS — Z9011 Acquired absence of right breast and nipple: Secondary | ICD-10-CM

## 2019-07-24 DIAGNOSIS — W19XXXA Unspecified fall, initial encounter: Secondary | ICD-10-CM | POA: Diagnosis not present

## 2019-07-24 DIAGNOSIS — Z87440 Personal history of urinary (tract) infections: Secondary | ICD-10-CM

## 2019-07-24 DIAGNOSIS — Z8249 Family history of ischemic heart disease and other diseases of the circulatory system: Secondary | ICD-10-CM

## 2019-07-24 DIAGNOSIS — Z7189 Other specified counseling: Secondary | ICD-10-CM | POA: Diagnosis not present

## 2019-07-24 DIAGNOSIS — Z978 Presence of other specified devices: Secondary | ICD-10-CM

## 2019-07-24 DIAGNOSIS — Y846 Urinary catheterization as the cause of abnormal reaction of the patient, or of later complication, without mention of misadventure at the time of the procedure: Secondary | ICD-10-CM | POA: Diagnosis present

## 2019-07-24 DIAGNOSIS — I2510 Atherosclerotic heart disease of native coronary artery without angina pectoris: Secondary | ICD-10-CM | POA: Diagnosis present

## 2019-07-24 DIAGNOSIS — Z66 Do not resuscitate: Secondary | ICD-10-CM | POA: Diagnosis present

## 2019-07-24 DIAGNOSIS — Z955 Presence of coronary angioplasty implant and graft: Secondary | ICD-10-CM

## 2019-07-24 DIAGNOSIS — F0390 Unspecified dementia without behavioral disturbance: Secondary | ICD-10-CM | POA: Diagnosis not present

## 2019-07-24 DIAGNOSIS — Z923 Personal history of irradiation: Secondary | ICD-10-CM

## 2019-07-24 DIAGNOSIS — E162 Hypoglycemia, unspecified: Secondary | ICD-10-CM | POA: Diagnosis not present

## 2019-07-24 DIAGNOSIS — R1310 Dysphagia, unspecified: Secondary | ICD-10-CM | POA: Diagnosis present

## 2019-07-24 DIAGNOSIS — M549 Dorsalgia, unspecified: Secondary | ICD-10-CM

## 2019-07-24 DIAGNOSIS — L89302 Pressure ulcer of unspecified buttock, stage 2: Secondary | ICD-10-CM | POA: Diagnosis not present

## 2019-07-24 DIAGNOSIS — Z515 Encounter for palliative care: Secondary | ICD-10-CM | POA: Diagnosis not present

## 2019-07-24 DIAGNOSIS — E11649 Type 2 diabetes mellitus with hypoglycemia without coma: Secondary | ICD-10-CM | POA: Diagnosis not present

## 2019-07-24 DIAGNOSIS — A419 Sepsis, unspecified organism: Secondary | ICD-10-CM | POA: Diagnosis not present

## 2019-07-24 DIAGNOSIS — F05 Delirium due to known physiological condition: Secondary | ICD-10-CM | POA: Diagnosis present

## 2019-07-24 DIAGNOSIS — K219 Gastro-esophageal reflux disease without esophagitis: Secondary | ICD-10-CM | POA: Diagnosis present

## 2019-07-24 DIAGNOSIS — N3090 Cystitis, unspecified without hematuria: Secondary | ICD-10-CM | POA: Diagnosis not present

## 2019-07-24 DIAGNOSIS — Z801 Family history of malignant neoplasm of trachea, bronchus and lung: Secondary | ICD-10-CM

## 2019-07-24 DIAGNOSIS — R55 Syncope and collapse: Secondary | ICD-10-CM | POA: Diagnosis not present

## 2019-07-24 DIAGNOSIS — I951 Orthostatic hypotension: Secondary | ICD-10-CM | POA: Diagnosis present

## 2019-07-24 DIAGNOSIS — N390 Urinary tract infection, site not specified: Secondary | ICD-10-CM | POA: Diagnosis not present

## 2019-07-24 DIAGNOSIS — Z853 Personal history of malignant neoplasm of breast: Secondary | ICD-10-CM

## 2019-07-24 DIAGNOSIS — R4020 Unspecified coma: Secondary | ICD-10-CM | POA: Diagnosis not present

## 2019-07-24 DIAGNOSIS — R6520 Severe sepsis without septic shock: Secondary | ICD-10-CM | POA: Diagnosis present

## 2019-07-24 DIAGNOSIS — M546 Pain in thoracic spine: Secondary | ICD-10-CM | POA: Diagnosis not present

## 2019-07-24 DIAGNOSIS — I252 Old myocardial infarction: Secondary | ICD-10-CM

## 2019-07-24 DIAGNOSIS — Z888 Allergy status to other drugs, medicaments and biological substances status: Secondary | ICD-10-CM | POA: Diagnosis not present

## 2019-07-24 DIAGNOSIS — Z8371 Family history of colonic polyps: Secondary | ICD-10-CM

## 2019-07-24 DIAGNOSIS — N179 Acute kidney failure, unspecified: Secondary | ICD-10-CM | POA: Diagnosis not present

## 2019-07-24 DIAGNOSIS — I10 Essential (primary) hypertension: Secondary | ICD-10-CM | POA: Diagnosis present

## 2019-07-24 DIAGNOSIS — L8990 Pressure ulcer of unspecified site, unspecified stage: Secondary | ICD-10-CM | POA: Insufficient documentation

## 2019-07-24 DIAGNOSIS — Z79899 Other long term (current) drug therapy: Secondary | ICD-10-CM

## 2019-07-24 DIAGNOSIS — Z90710 Acquired absence of both cervix and uterus: Secondary | ICD-10-CM

## 2019-07-24 DIAGNOSIS — E785 Hyperlipidemia, unspecified: Secondary | ICD-10-CM | POA: Diagnosis present

## 2019-07-24 DIAGNOSIS — Z83438 Family history of other disorder of lipoprotein metabolism and other lipidemia: Secondary | ICD-10-CM

## 2019-07-24 DIAGNOSIS — E119 Type 2 diabetes mellitus without complications: Secondary | ICD-10-CM

## 2019-07-24 DIAGNOSIS — F0150 Vascular dementia without behavioral disturbance: Secondary | ICD-10-CM | POA: Diagnosis not present

## 2019-07-24 DIAGNOSIS — Z79811 Long term (current) use of aromatase inhibitors: Secondary | ICD-10-CM

## 2019-07-24 DIAGNOSIS — Y92239 Unspecified place in hospital as the place of occurrence of the external cause: Secondary | ICD-10-CM | POA: Diagnosis not present

## 2019-07-24 DIAGNOSIS — R4182 Altered mental status, unspecified: Secondary | ICD-10-CM | POA: Diagnosis not present

## 2019-07-24 DIAGNOSIS — K449 Diaphragmatic hernia without obstruction or gangrene: Secondary | ICD-10-CM | POA: Diagnosis present

## 2019-07-24 DIAGNOSIS — R339 Retention of urine, unspecified: Secondary | ICD-10-CM

## 2019-07-24 DIAGNOSIS — Z20822 Contact with and (suspected) exposure to covid-19: Secondary | ICD-10-CM | POA: Diagnosis present

## 2019-07-24 DIAGNOSIS — T83511A Infection and inflammatory reaction due to indwelling urethral catheter, initial encounter: Secondary | ICD-10-CM | POA: Diagnosis not present

## 2019-07-24 DIAGNOSIS — Z880 Allergy status to penicillin: Secondary | ICD-10-CM | POA: Diagnosis not present

## 2019-07-24 DIAGNOSIS — N9912 Postprocedural urethral stricture, female: Secondary | ICD-10-CM | POA: Diagnosis not present

## 2019-07-24 DIAGNOSIS — R627 Adult failure to thrive: Secondary | ICD-10-CM | POA: Diagnosis present

## 2019-07-24 DIAGNOSIS — Z6822 Body mass index (BMI) 22.0-22.9, adult: Secondary | ICD-10-CM

## 2019-07-24 DIAGNOSIS — L89002 Pressure ulcer of unspecified elbow, stage 2: Secondary | ICD-10-CM | POA: Diagnosis not present

## 2019-07-24 DIAGNOSIS — G9341 Metabolic encephalopathy: Secondary | ICD-10-CM | POA: Diagnosis not present

## 2019-07-24 DIAGNOSIS — N35919 Unspecified urethral stricture, male, unspecified site: Secondary | ICD-10-CM

## 2019-07-24 DIAGNOSIS — G9340 Encephalopathy, unspecified: Secondary | ICD-10-CM | POA: Diagnosis not present

## 2019-07-24 DIAGNOSIS — S3992XA Unspecified injury of lower back, initial encounter: Secondary | ICD-10-CM | POA: Diagnosis not present

## 2019-07-24 DIAGNOSIS — N3592 Unspecified urethral stricture, female: Secondary | ICD-10-CM | POA: Diagnosis present

## 2019-07-24 DIAGNOSIS — M1710 Unilateral primary osteoarthritis, unspecified knee: Secondary | ICD-10-CM | POA: Diagnosis present

## 2019-07-24 DIAGNOSIS — R0902 Hypoxemia: Secondary | ICD-10-CM | POA: Diagnosis not present

## 2019-07-24 DIAGNOSIS — Z87891 Personal history of nicotine dependence: Secondary | ICD-10-CM

## 2019-07-24 LAB — URINALYSIS, COMPLETE (UACMP) WITH MICROSCOPIC
Bacteria, UA: NONE SEEN
Bilirubin Urine: NEGATIVE
Glucose, UA: NEGATIVE mg/dL
Ketones, ur: 5 mg/dL — AB
Nitrite: NEGATIVE
Protein, ur: 100 mg/dL — AB
Specific Gravity, Urine: 1.021 (ref 1.005–1.030)
WBC, UA: >50 WBC/hpf — ABNORMAL HIGH (ref 0–5)
pH: 5 (ref 5.0–8.0)

## 2019-07-24 LAB — CBC
HCT: 36.2 % (ref 36.0–46.0)
Hemoglobin: 11.3 g/dL — ABNORMAL LOW (ref 12.0–15.0)
MCH: 28.1 pg (ref 26.0–34.0)
MCHC: 31.2 g/dL (ref 30.0–36.0)
MCV: 90 fL (ref 80.0–100.0)
Platelets: 218 10*3/uL (ref 150–400)
RBC: 4.02 MIL/uL (ref 3.87–5.11)
RDW: 18.3 % — ABNORMAL HIGH (ref 11.5–15.5)
WBC: 11.6 10*3/uL — ABNORMAL HIGH (ref 4.0–10.5)
nRBC: 0 % (ref 0.0–0.2)

## 2019-07-24 LAB — LACTIC ACID, PLASMA
Lactic Acid, Venous: 4.4 mmol/L (ref 0.5–1.9)
Lactic Acid, Venous: 2 mmol/L (ref 0.5–1.9)

## 2019-07-24 LAB — BASIC METABOLIC PANEL
Anion gap: 11 (ref 5–15)
BUN: 39 mg/dL — ABNORMAL HIGH (ref 8–23)
CO2: 22 mmol/L (ref 22–32)
Calcium: 8.8 mg/dL — ABNORMAL LOW (ref 8.9–10.3)
Chloride: 106 mmol/L (ref 98–111)
Creatinine, Ser: 1.44 mg/dL — ABNORMAL HIGH (ref 0.44–1.00)
GFR calc Af Amer: 40 mL/min — ABNORMAL LOW (ref >60)
GFR calc non Af Amer: 34 mL/min — ABNORMAL LOW (ref >60)
Glucose, Bld: 120 mg/dL — ABNORMAL HIGH (ref 70–99)
Potassium: 3.9 mmol/L (ref 3.5–5.1)
Sodium: 139 mmol/L (ref 135–145)

## 2019-07-24 LAB — GLUCOSE, CAPILLARY: Glucose-Capillary: 107 mg/dL — ABNORMAL HIGH (ref 70–99)

## 2019-07-24 LAB — SARS CORONAVIRUS 2 BY RT PCR (HOSPITAL ORDER, PERFORMED IN ~~LOC~~ HOSPITAL LAB): SARS Coronavirus 2: NEGATIVE

## 2019-07-24 MED ORDER — ACETAMINOPHEN 325 MG PO TABS
650.0000 mg | ORAL_TABLET | Freq: Four times a day (QID) | ORAL | Status: DC | PRN
  Administered 2019-08-01 – 2019-08-06 (×2): 650 mg via ORAL
  Filled 2019-07-24 (×2): qty 2

## 2019-07-24 MED ORDER — ACETAMINOPHEN 650 MG RE SUPP: 650.0000 mg | Freq: Four times a day (QID) | RECTAL | Status: DC | PRN

## 2019-07-24 MED ORDER — ONDANSETRON HCL 4 MG/2ML IJ SOLN
4.0000 mg | Freq: Four times a day (QID) | INTRAMUSCULAR | Status: DC | PRN
  Administered 2019-07-30: 4 mg via INTRAVENOUS
  Filled 2019-07-24: qty 2

## 2019-07-24 MED ORDER — SODIUM CHLORIDE 0.9% FLUSH: 3.0000 mL | Freq: Once | INTRAVENOUS | Status: DC

## 2019-07-24 MED ORDER — MIDODRINE HCL 5 MG PO TABS: 10.0000 mg | ORAL_TABLET | Freq: Three times a day (TID) | ORAL | Status: DC

## 2019-07-24 MED ORDER — LACTATED RINGERS IV BOLUS
1000.0000 mL | Freq: Once | INTRAVENOUS | Status: AC
  Administered 2019-07-24: 1000 mL via INTRAVENOUS

## 2019-07-24 MED ORDER — CALCIUM CARBONATE-VITAMIN D 500-200 MG-UNIT PO TABS
1.0000 | ORAL_TABLET | ORAL | Status: DC
  Administered 2019-07-25: 1 via ORAL
  Filled 2019-07-24: qty 1

## 2019-07-24 MED ORDER — ONDANSETRON HCL 4 MG PO TABS: 4.0000 mg | ORAL_TABLET | Freq: Four times a day (QID) | ORAL | Status: DC | PRN

## 2019-07-24 MED ORDER — DOCUSATE SODIUM 100 MG PO CAPS
100.0000 mg | ORAL_CAPSULE | Freq: Two times a day (BID) | ORAL | Status: DC
  Administered 2019-07-25 – 2019-07-26 (×2): 100 mg via ORAL
  Filled 2019-07-24 (×2): qty 1

## 2019-07-24 MED ORDER — CEFTRIAXONE SODIUM 500 MG IJ SOLR
1000.0000 mg | Freq: Once | INTRAMUSCULAR | Status: AC
Start: 2019-07-24 — End: 2019-07-24
  Administered 2019-07-24: 1000 mg via INTRAMUSCULAR

## 2019-07-24 MED ORDER — PANTOPRAZOLE SODIUM 40 MG PO TBEC
40.0000 mg | DELAYED_RELEASE_TABLET | Freq: Every day | ORAL | Status: DC
  Administered 2019-07-25 – 2019-07-26 (×2): 40 mg via ORAL
  Filled 2019-07-24 (×2): qty 1

## 2019-07-24 MED ORDER — SODIUM CHLORIDE 0.9 % IV SOLN
2.0000 g | INTRAVENOUS | Status: DC
  Administered 2019-07-25: 2 g via INTRAVENOUS
  Filled 2019-07-24 (×2): qty 2

## 2019-07-24 MED ORDER — INSULIN ASPART 100 UNIT/ML ~~LOC~~ SOLN: 0.0000 [IU] | Freq: Every day | SUBCUTANEOUS | Status: DC

## 2019-07-24 MED ORDER — CEFEPIME HCL 2 G IJ SOLR: 2.0000 g | Freq: Once | INTRAMUSCULAR | Status: DC

## 2019-07-24 MED ORDER — SODIUM CHLORIDE 0.9 % IV BOLUS
1000.0000 mL | Freq: Once | INTRAVENOUS | Status: AC
  Administered 2019-07-24: 1000 mL via INTRAVENOUS

## 2019-07-24 MED ORDER — LETROZOLE 2.5 MG PO TABS
2.5000 mg | ORAL_TABLET | Freq: Every day | ORAL | Status: DC
  Administered 2019-07-25 – 2019-07-26 (×2): 2.5 mg via ORAL
  Filled 2019-07-24 (×3): qty 1

## 2019-07-24 MED ORDER — TRAMADOL HCL 50 MG PO TABS
50.0000 mg | ORAL_TABLET | Freq: Four times a day (QID) | ORAL | Status: DC | PRN
  Administered 2019-07-25 (×2): 50 mg via ORAL
  Filled 2019-07-24 (×2): qty 1

## 2019-07-24 MED ORDER — SODIUM CHLORIDE 0.9 % IV BOLUS (SEPSIS)
500.0000 mL | Freq: Once | INTRAVENOUS | Status: AC
  Administered 2019-07-24: 500 mL via INTRAVENOUS

## 2019-07-24 MED ORDER — SENNOSIDES-DOCUSATE SODIUM 8.6-50 MG PO TABS: 2.0000 | ORAL_TABLET | Freq: Two times a day (BID) | ORAL | Status: DC | PRN

## 2019-07-24 MED ORDER — ENOXAPARIN SODIUM 30 MG/0.3ML ~~LOC~~ SOLN
30.0000 mg | SUBCUTANEOUS | Status: DC
  Administered 2019-07-25: 30 mg via SUBCUTANEOUS
  Filled 2019-07-24 (×2): qty 0.3

## 2019-07-24 MED ORDER — SODIUM CHLORIDE 0.9 % IV SOLN
2.0000 g | Freq: Once | INTRAVENOUS | Status: AC
  Administered 2019-07-24: 2 g via INTRAVENOUS
  Filled 2019-07-24: qty 2

## 2019-07-24 MED ORDER — INSULIN ASPART 100 UNIT/ML ~~LOC~~ SOLN: 0.0000 [IU] | Freq: Three times a day (TID) | SUBCUTANEOUS | Status: DC

## 2019-07-24 MED ORDER — SODIUM CHLORIDE 0.9 % IV SOLN: INTRAVENOUS | Status: DC

## 2019-07-24 NOTE — Progress Notes (Signed)
Pharmacy Antibiotic Note  [name] [name] is a 79 y.o. female admitted on 07/24/2019. Concern for UTI. Pharmacy has been consulted for cefepime dosing.  Plan: Cefepime 2 g IV q24h (according to patient's current renal function)  Height: 5\' 5"  (165.1 cm) Weight: 61.2 kg (135 lb) IBW/kg (Calculated) : 57  Temp (24hrs), Avg:98.6 F (37 C), Min:97.5 F (36.4 C), Max:99.6 F (37.6 C)  Recent Labs  Lab 07/24/19 1333 07/24/19 1425 07/24/19 1430  WBC 11.6*  --   --   CREATININE  --  1.44*  --   LATICACIDVEN 4.4*  --  2.0*    Estimated Creatinine Clearance: 28 mL/min (A) (by C-G formula based on SCr of 1.44 mg/dL (H)).    Allergies  Allergen Reactions  . Celebrex  [Celecoxib]     Dark stools.  . Penicillins Other (See Comments)    Childhood reaction Has patient had a PCN reaction causing immediate rash, facial/tongue/throat swelling, SOB or lightheadedness with hypotension: Unknown Has patient had a PCN reaction causing severe rash involving mucus membranes or skin necrosis: Unknown Has patient had a PCN reaction that required hospitalization: Unknown Has patient had a PCN reaction occurring within the last 10 years: Unknown If all of the above answers are "NO", then may proceed with Cephalosporin use.   Marland Kitchen Prevacid [Lansoprazole] Diarrhea    Antimicrobials this admission: Cefepime 5/18 >>  Dose adjustments this admission: NA  Microbiology results: 5/18 BCx: pending  Thank you for allowing pharmacy to be a part of this patient's care.  Tawnya Crook, PharmD 07/24/2019 7:31 PM

## 2019-07-24 NOTE — ED Provider Notes (Signed)
[facility] Emergency Department Provider Note  ____________________________________________   First MD Initiated Contact with Patient 07/24/19 1302     (approximate)  I have reviewed the triage vital signs and the nursing notes.   HISTORY  Chief Complaint Hypotension and Weakness    HPI [name] [name] is a 79 y.o. female with past medical history as below including hypertension, hyperlipidemia, coronary disease, here with altered mental status.   History is provided primarily by nursing.  Patient is currently at [facility].  Reportedly, she had her Foley catheter replaced this morning at her urologist.  This went well.  She was taken back to the facility.  At the facility, she reportedly began staring off and became unresponsive.  She dropped her blood pressure to the 60s over 30s.  She had questionable seizure-like activity.  She then came back to consciousness, but has been drowsy.  Husband is present at bedside.  He saw her last night and states she was at her baseline.    Level 5 caveat invoked as remainder of history, ROS, and physical exam limited due to patient's dementia/encephalopathy.     Past Medical History:  Diagnosis Date  . Breast cancer ([place]) 2018   Right breast  . 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 Mucin noted on biopsy, not on wide excision.  . Coronary artery disease   . GERD (gastroesophageal reflux disease)   . Hyperlipidemia   . Hypertension   . 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 Dr. Kellie Moor DX by punch bioppsy  . Lichen planus    right breast   . Lichen planus   . MI (myocardial infarction) ([place])    2015  . Personal history of radiation therapy 09/2016   RIGHT lumpectomy w/ radiation    Patient Active Problem List   Diagnosis Date Noted  . Sepsis ([place]) 07/01/2019  . Syncope 06/27/2019  . History of breast cancer  06/12/2019  . Acute renal failure ([place]) 06/12/2019  . Generalized weakness 06/12/2019  . Hyperkalemia 06/12/2019  . Hypertensive urgency 06/12/2019  . Disorder of bursae of shoulder region 10/20/2018  . Lower GI bleeding 09/10/2018  . Hiatal hernia   . Melena   . Polyp of ascending colon   . Diverticulosis of large intestine without diverticulitis   . GIB (gastrointestinal bleeding) 09/05/2018  . LVH (left ventricular hypertrophy) due to hypertensive disease, without heart failure 04/10/2018  . Chest pain 03/31/2018  . Vasovagal syncope 03/20/2018  . Lichen planus [id number]  . Malignant neoplasm of upper-inner quadrant of right breast in female, estrogen receptor positive ([place]) 07/28/2016  . Gastric ulcer requiring drug therapy, chronic 01/23/2016  . MI (mitral incompetence) 07/22/2015  . Combined fat and carbohydrate induced hyperlipemia 12/26/2014  . Urinary tract infection associated with indwelling urethral catheter ([place]) 08/18/2014  . Blurry vision 08/18/2014  . Allergic rhinitis 07/12/2014  . Absolute anemia 07/12/2014  . Baker's cyst of knee 07/12/2014  . Atherosclerosis of coronary artery 07/12/2014  . CAFL (chronic airflow limitation) ([place]) 07/12/2014  . Dizziness 07/12/2014  . Acid reflux 07/12/2014  . 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 History of colon polyps 07/12/2014  . Hypercholesteremia 07/12/2014  . Malaise and fatigue 07/12/2014  . Heart attack ([place]) 07/12/2014  . Muscle ache 07/12/2014  . Arthritis, degenerative 07/12/2014  . Bradycardia 08/23/2013  . CAD (coronary artery disease) 08/17/2013  . Diabetes ([place]) 08/17/2013  .  Diabetes mellitus ([place]) 08/17/2013  . Peripheral vascular disease ([place]) 08/17/2013    Past Surgical History:  Procedure Laterality Date  . ABDOMINAL HYSTERECTOMY    . APPENDECTOMY    . BREAST BIOPSY Right 07/23/2016   INVASIVE MAMMARY CARCINOMA WITH AREAS OF EXTRACELLULAR MUCIN  . BREAST EXCISIONAL BIOPSY     INVASIVE MUCINOUS  MAMMARY CARCINOMA.   Marland Kitchen BREAST LUMPECTOMY Right 08/20/2016   INVASIVE MUCINOUS MAMMARY CARCINOMA. /Grade 2   . COLONOSCOPY  2016  . COLONOSCOPY N/A 09/06/2018   Procedure: COLONOSCOPY;  Surgeon: Virgel Manifold, MD;  Location: [facility] ENDOSCOPY;  Service: Endoscopy;  Laterality: N/A;  . CORONARY ANGIOPLASTY WITH STENT PLACEMENT    . CYSTOSCOPY    . CYSTOSCOPY WITH URETHRAL DILATATION N/A 06/12/2019   Procedure: CYSTOSCOPY WITH URETHRAL DILATATION;  Surgeon: Abbie Sons, MD;  Location: [facility] ORS;  Service: Urology;  Laterality: N/A;  . ESOPHAGOGASTRODUODENOSCOPY N/A 09/06/2018   Procedure: ESOPHAGOGASTRODUODENOSCOPY (EGD);  Surgeon: Virgel Manifold, MD;  Location: [facility] ENDOSCOPY;  Service: Endoscopy;  Laterality: N/A;  . OOPHORECTOMY    . PARTIAL MASTECTOMY WITH AXILLARY SENTINEL LYMPH NODE BIOPSY Right 08/09/2016   Procedure: PARTIAL MASTECTOMY WITH AXILLARY SENTINEL LYMPH NODE BIOPSY;  Surgeon: Robert Bellow, MD;  Location: [facility] ORS;  Service: General;  Laterality: Right;  . TONSILLECTOMY      Prior to Admission medications   Medication Sig Start Date End Date Taking? Authorizing Provider  acetaminophen (TYLENOL) 650 MG CR tablet Take 1,300 mg by mouth every 8 (eight) hours as needed for pain.   Yes [provider]  Calcium Carb-Cholecalciferol (CALCIUM 500+D) 500-200 MG-UNIT TABS Take 1 tablet by mouth every other day.    Yes [provider]  letrozole (FEMARA) 2.5 MG tablet Take 2.5 mg by mouth daily.   Yes [provider]  midodrine (PROAMATINE) 10 MG tablet Take 1 tablet (10 mg total) by mouth 3 (three) times daily with meals. 07/10/19  Yes Mercy Riding, MD  omeprazole (PRILOSEC) 20 MG capsule TAKE 1 CAPSULE (20 MG TOTAL) BY MOUTH DAILY AS NEEDED (INDIGESTION). Patient taking differently: Take 20 mg by mouth daily.  06/12/19  Yes Jerrol Banana., MD  senna-docusate (SENOKOT-S) 8.6-50 MG tablet Take 2 tablets by mouth 2 (two) times daily as needed  for mild constipation. 06/28/19  Yes Sharen Hones, MD  haloperidol (HALDOL) 0.5 MG tablet Take 1 tablet (0.5 mg total) by mouth daily as needed for agitation. Patient not taking: Reported on 07/24/2019 07/12/19   Regalado, Jerald Kief A, MD  hydrALAZINE (APRESOLINE) 25 MG tablet Take 1 tablet (25 mg total) by mouth every 12 (twelve) hours as needed (For standing SBP above 160 or standing DBP above 100). Patient not taking: Reported on 07/24/2019 07/10/19   Mercy Riding, MD    Allergies Celebrex  [celecoxib], Penicillins, and Prevacid [lansoprazole]  Family History  Problem Relation Age of Onset  . Hyperlipidemia Mother   . Allergies Mother   . Cerebral aneurysm Mother        cause of death at age 22  . Heart disease Father        Fatal MI ag 35  . Cancer Brother        lung cancer  . Hyperlipidemia Brother   . Colonic polyp Brother   . Healthy Son   . Cancer - Lung Brother        colon  . Healthy Son   . Breast cancer Neg Hx  Social History Social History   Tobacco Use  . Smoking status: Former Smoker    Packs/day: 0.25    Years: 10.00    Pack years: 2.50    Types: Cigarettes    Quit date: 03/06/1998    Years since quitting: 21.3  . Smokeless tobacco: Never Used  Substance Use Topics  . Alcohol use: No  . Drug use: No    Review of Systems  Review of Systems  Unable to perform ROS: Mental status change     ____________________________________________  PHYSICAL EXAM:      VITAL SIGNS: ED Triage Vitals  Enc Vitals Group     BP 07/24/19 1246 (!) 111/50     Pulse Rate 07/24/19 1302 (!) 122     Resp 07/24/19 1246 20     Temp 07/24/19 1246 (!) 97.5 F (36.4 C)     Temp Source 07/24/19 1246 Oral     SpO2 07/24/19 1246 (P) 100 %     Weight 07/24/19 1244 135 lb (61.2 kg)     Height 07/24/19 1244 5\' 5"  (1.651 m)     Head Circumference --      [place] --      Pain Score --      Pain Loc --      Pain Edu? --      Excl. in [place]? --      Physical Exam Vitals and  nursing note reviewed.  Constitutional:      General: She is not in acute distress.    Appearance: She is well-developed.  HENT:     Head: Normocephalic and atraumatic.     Mouth/Throat:     Mouth: Mucous membranes are dry.  Eyes:     Conjunctiva/sclera: Conjunctivae normal.  Cardiovascular:     Rate and Rhythm: Normal rate and regular rhythm.     Heart sounds: Normal heart sounds.  Pulmonary:     Effort: Pulmonary effort is normal. No respiratory distress.     Breath sounds: No wheezing.  Abdominal:     General: There is no distension.  Musculoskeletal:     Cervical back: Neck supple.  Skin:    General: Skin is warm.     Capillary Refill: Capillary refill takes less than 2 seconds.     Findings: No rash.  Neurological:     Mental Status: She is lethargic and disoriented.     Motor: No abnormal muscle tone.       ____________________________________________   LABS (all labs ordered are listed, but only abnormal results are displayed)  Labs Reviewed  CBC - Abnormal; Notable for the following components:      Result Value   WBC 11.6 (*)    Hemoglobin 11.3 (*)    RDW 18.3 (*)    All other components within normal limits  LACTIC ACID, PLASMA - Abnormal; Notable for the following components:   Lactic Acid, Venous 4.4 (*)    All other components within normal limits  CULTURE, BLOOD (ROUTINE X 2)  CULTURE, BLOOD (ROUTINE X 2)  URINALYSIS, COMPLETE (UACMP) WITH MICROSCOPIC  LACTIC ACID, PLASMA  BASIC METABOLIC PANEL  CBG MONITORING, ED    ____________________________________________  EKG: Normal sinus rhythm, ventricular rate 99.  PR 82, QRS 72, QTc 474.  Nonspecific ST changes.  No acute ST elevations or depressions. ________________________________________  RADIOLOGY All imaging, including plain films, CT scans, and ultrasounds, independently reviewed by me, and interpretations confirmed via formal radiology reads.  ED MD interpretation:  Chest x-ray:  Clear  Official radiology report(s): CT Head Wo Contrast  Result Date: 07/24/2019 CLINICAL DATA:  Encephalopathy. EXAM: CT HEAD WITHOUT CONTRAST TECHNIQUE: Contiguous axial images were obtained from the base of the skull through the vertex without intravenous contrast. COMPARISON:  Noncontrast head CT 07/01/2019, brain MRI 05/25/2019 FINDINGS: Brain: Stable mild ill-defined hypoattenuation within cerebral white matter which is nonspecific, but consistent with chronic small vessel ischemic disease. Stable, mild generalized parenchymal atrophy. Redemonstrated chronic lacunar infarct within the right caudate head (series 3, image 15) There is no acute intracranial hemorrhage. No demarcated cortical infarct. No extra-axial fluid collection. No evidence of intracranial mass. No midline shift. Vascular: No hyperdense vessel.  Atherosclerotic calcifications Skull: Normal. Negative for fracture or focal lesion. Sinuses/Orbits: Visualized orbits show no acute finding. No significant paranasal sinus disease or mastoid effusion at the imaged levels. IMPRESSION: 1. No evidence of acute intracranial abnormality. 2. Stable mild generalized parenchymal atrophy and chronic small vessel ischemic disease. 3. Redemonstrated chronic right basal ganglia lacunar infarct. Electronically Signed   By: Kellie Simmering DO   On: 07/24/2019 14:37   DG Chest Portable 1 View  Result Date: 07/24/2019 CLINICAL DATA:  Altered mental status. EXAM: PORTABLE CHEST 1 VIEW COMPARISON:  Chest radiograph 07/01/2019. Chest CT 03/31/2018 FINDINGS: Lower lung volumes from prior exam. Heart size is normal for technique. Aortic atherosclerosis and tortuosity, unchanged. Hiatal hernia with air-filled stomach projecting over the left lung base, as seen on prior chest CT. No acute airspace disease. No pulmonary edema, pleural effusion, or pneumothorax. No acute osseous abnormalities are seen. IMPRESSION: 1. Lower lung volumes from prior exam. No acute chest  finding. 2. Large hiatal hernia. Aortic Atherosclerosis (ICD10-I70.0). Electronically Signed   By: Keith Rake M.D.   On: 07/24/2019 14:01    ____________________________________________  PROCEDURES   Procedure(s) performed (including Critical Care):  .Critical Care Performed by: Duffy Bruce, MD Authorized by: Duffy Bruce, MD   Critical care provider statement:    Critical care time (minutes):  35   Critical care time was exclusive of:  Separately billable procedures and treating other patients and teaching time   Critical care was necessary to treat or prevent imminent or life-threatening deterioration of the following conditions:  Cardiac failure, circulatory failure and sepsis   Critical care was time spent personally by me on the following activities:  Development of treatment plan with patient or surrogate, discussions with consultants, evaluation of patient's response to treatment, examination of patient, obtaining history from patient or surrogate, ordering and performing treatments and interventions, ordering and review of laboratory studies, ordering and review of radiographic studies, pulse oximetry, re-evaluation of patient's condition and review of old charts   I assumed direction of critical care for this patient from another provider in my specialty: no   .1-3 Lead EKG Interpretation Performed by: Duffy Bruce, MD Authorized by: Duffy Bruce, MD     Interpretation: normal     ECG rate:  90-100   ECG rate assessment: normal     Rhythm: sinus rhythm     Ectopy: none     Conduction: normal   Comments:     Indication: Sepsis    ____________________________________________  INITIAL IMPRESSION / MDM / ASSESSMENT AND PLAN / ED COURSE  As part of my medical decision making, I reviewed the following data within the [place] notes reviewed and incorporated, Old chart reviewed, Notes from prior ED visits, and [place] Controlled  Substance Database       *  [name] [name] was evaluated in Emergency Department on 07/24/2019 for the symptoms described in the history of present illness. She was evaluated in the context of the global COVID-19 pandemic, which necessitated consideration that the patient might be at risk for infection with the SARS-CoV-2 virus that causes COVID-19. Institutional protocols and algorithms that pertain to the evaluation of patients at risk for COVID-19 are in a state of rapid change based on information released by regulatory bodies including the CDC and federal and state organizations. These policies and algorithms were followed during the patient's care in the ED.  Some ED evaluations and interventions may be delayed as a result of limited staffing during the pandemic.*     Medical Decision Making:  79 yo F here with transient LOC/episode of unresponsiveness. On arrival, pt tachycardic but BP normal. Just had foley catheter replaced. Primary consideration is transient hypoperfusion with encephalopathy related to recent foley change and possible UTI/transient bacteremia, though differential also includes partial seizure. LA 4.4. Will activate as a code sepsis with Cefepime (should treat recent E Coli and Klebsiella) and 30 cc/kg fluids. Pt is DNR but is amenable to fluids/short term treatment per husband who is aware. Labs, imaging are pending.  ____________________________________________  FINAL CLINICAL IMPRESSION(S) / ED DIAGNOSES  Final diagnoses:  Sepsis without acute organ dysfunction, due to unspecified organism [facility])     MEDICATIONS GIVEN DURING THIS VISIT:  Medications  sodium chloride flush (NS) 0.9 % injection 3 mL ( Intravenous Canceled Entry 07/24/19 1305)  ceFEPIme (MAXIPIME) 2 g in sodium chloride 0.9 % 100 mL IVPB (2 g Intravenous New Bag/Given 07/24/19 1505)  sodium chloride 0.9 % bolus 1,000 mL (1,000 mLs Intravenous New Bag/Given 07/24/19 1331)  lactated ringers bolus 1,000  mL (1,000 mLs Intravenous New Bag/Given 07/24/19 1505)     ED Discharge Orders    None       Note:  This document was prepared using Dragon voice recognition software and may include unintentional dictation errors.   Duffy Bruce, MD 07/24/19 [phone]

## 2019-07-24 NOTE — ED Triage Notes (Signed)
See first RN note at this time. In triage BP 111/50. Pt noted to be sitting in wheelchair with eyes closed, however will answer questions when asked.   Pt's husband reports pt was having a catheter changed out.

## 2019-07-24 NOTE — ED Notes (Signed)
While this RN and Les, EDT with patient in Triage 2. Pt noted to to have periods of staring off followed by short periods of increased agitation. Pt then had approx 45 seconds -1 min period of unresponsiveness, with accompanied approx 30 seconds of apnea. Pt responded to sternal rub by this RN. First RN made aware, pt brought back to room by this RN and Les, EDT-P accompanied by patient husband. Primary RN Karena Addison, and EDP made aware of incident. Repeat VS obtained immediately after unresponsive incident.

## 2019-07-24 NOTE — ED Notes (Signed)
ED TO INPATIENT HANDOFF REPORT  ED Nurse Name and Phone #: Olen Cordial 64  S Name/Age/Gender [name] [name] B Rochel 79 y.o. female Room/Bed: ED26A/ED26A  Code Status   Code Status: DNR  Home/SNF/Other [facility] Patient oriented to: self Is this baseline? No   Triage Complete: Triage complete  Chief Complaint Severe sepsis ([place]) [A41.9, R65.20]  Triage Note Pt comes into the ED via [facility] from [facility] with c/o pt being unresponsive, syncople episode 53/30 [facility]/p [facility], EMS 118/64, 96%RA, CBG 119, 99.4temp, 80HR never unresponsive with EMS, pt has hx of dementia.  See first RN note at this time. In triage BP 111/50. Pt noted to be sitting in wheelchair with eyes closed, however will answer questions when asked.   Pt's husband reports pt was having a catheter changed out.     Allergies Allergies  Allergen Reactions  . Celebrex  [Celecoxib]     Dark stools.  . Penicillins Other (See Comments)    Childhood reaction Has patient had a PCN reaction causing immediate rash, facial/tongue/throat swelling, SOB or lightheadedness with hypotension: Unknown Has patient had a PCN reaction causing severe rash involving mucus membranes or skin necrosis: Unknown Has patient had a PCN reaction that required hospitalization: Unknown Has patient had a PCN reaction occurring within the last 10 years: Unknown If all of the above answers are "NO", then may proceed with Cephalosporin use.   Marland Kitchen Prevacid [Lansoprazole] Diarrhea    Level of Care/Admitting Diagnosis ED Disposition    ED Disposition Condition Comment   Admit  Hospital Area: [facility] [[id number]]  Level of Care: Med-Surg [16]  Covid Evaluation: Asymptomatic Screening Protocol (No Symptoms)  Diagnosis: Severe sepsis [id number]  Admitting Physician: Athena Masse [id number]  Attending Physician: Athena Masse [id number]  Estimated length of stay: past midnight tomorrow  Certification:: I certify this patient will  need inpatient services for at least 2 midnights       B Medical/Surgery History Past Medical History:  Diagnosis Date  . Breast cancer ([place]) 2018   Right breast  . 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 Mucin noted on biopsy, not on wide excision.  . Coronary artery disease   . GERD (gastroesophageal reflux disease)   . Hyperlipidemia   . Hypertension   . 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 Dr. Kellie Moor DX by punch bioppsy  . Lichen planus    right breast   . Lichen planus   . MI (myocardial infarction) ([place])    2015  . Personal history of radiation therapy 09/2016   RIGHT lumpectomy w/ radiation   Past Surgical History:  Procedure Laterality Date  . ABDOMINAL HYSTERECTOMY    . APPENDECTOMY    . BREAST BIOPSY Right 07/23/2016   INVASIVE MAMMARY CARCINOMA WITH AREAS OF EXTRACELLULAR MUCIN  . BREAST EXCISIONAL BIOPSY     INVASIVE MUCINOUS MAMMARY CARCINOMA.   Marland Kitchen BREAST LUMPECTOMY Right 08/20/2016   INVASIVE MUCINOUS MAMMARY CARCINOMA. /Grade 2   . COLONOSCOPY  2016  . COLONOSCOPY N/A 09/06/2018   Procedure: COLONOSCOPY;  Surgeon: Virgel Manifold, MD;  Location: [facility] ENDOSCOPY;  Service: Endoscopy;  Laterality: N/A;  . CORONARY ANGIOPLASTY WITH STENT PLACEMENT    . CYSTOSCOPY    . CYSTOSCOPY WITH URETHRAL DILATATION N/A 06/12/2019   Procedure: CYSTOSCOPY WITH URETHRAL DILATATION;  Surgeon: Abbie Sons, MD;  Location: [facility] ORS;  Service: Urology;  Laterality:  N/A;  . ESOPHAGOGASTRODUODENOSCOPY N/A 09/06/2018   Procedure: ESOPHAGOGASTRODUODENOSCOPY (EGD);  Surgeon: Virgel Manifold, MD;  Location: [facility] ENDOSCOPY;  Service: Endoscopy;  Laterality: N/A;  . OOPHORECTOMY    . PARTIAL MASTECTOMY WITH AXILLARY SENTINEL LYMPH NODE BIOPSY Right 08/09/2016   Procedure: PARTIAL MASTECTOMY WITH AXILLARY SENTINEL LYMPH NODE BIOPSY;  Surgeon: Robert Bellow, MD;  Location: [facility] ORS;   Service: General;  Laterality: Right;  . TONSILLECTOMY       A IV Location/Drains/Wounds Patient Lines/Drains/Airways Status   Active Line/Drains/Airways    Name:   Placement date:   Placement time:   Site:   Days:   Peripheral IV 07/24/19 Left Antecubital   07/24/19    1329    Antecubital   less than 1   Urethral Catheter dr. Bernardo Heater Latex;Double-lumen;Straight-tip 20 Fr.   06/12/19    1345    Latex;Double-lumen;Straight-tip   42   Incision (Closed) 08/09/16 Breast Right   08/09/16    1521     1079   Incision (Closed) 08/09/16 Axilla Right   08/09/16    1526     1079          Intake/Output Last 24 hours No intake or output data in the 24 hours ending 07/24/19 2301  Labs/Imaging Results for orders placed or performed during the hospital encounter of 07/24/19 (from the past 48 hour(s))  CBC     Status: Abnormal   Collection Time: 07/24/19  1:33 PM  Result Value Ref Range   WBC 11.6 (H) 4.0 - 10.5 K/uL   RBC 4.02 3.87 - 5.11 MIL/uL   Hemoglobin 11.3 (L) 12.0 - 15.0 g/dL   HCT 36.2 36.0 - 46.0 %   MCV 90.0 80.0 - 100.0 fL   MCH 28.1 26.0 - 34.0 pg   MCHC 31.2 30.0 - 36.0 g/dL   RDW 18.3 (H) 11.5 - 15.5 %   Platelets 218 150 - 400 K/uL   nRBC 0.0 0.0 - 0.2 %    Comment: Performed at [facility], [address] [place], [place] [address]  Lactic acid, plasma     Status: Abnormal   Collection Time: 07/24/19  1:33 PM  Result Value Ref Range   Lactic Acid, Venous 4.4 (HH) 0.5 - 1.9 mmol/L    Comment: CRITICAL RESULT CALLED TO, READ BACK BY AND VERIFIED WITH DEE MCCLAIN 1420 07/24/2019 D BRADLEY Performed at [facility], [address] [place], [place] [id number]   Basic metabolic panel     Status: Abnormal   Collection Time: 07/24/19  2:25 PM  Result Value Ref Range   Sodium 139 135 - 145 mmol/L   Potassium 3.9 3.5 - 5.1 mmol/L   Chloride 106 98 - 111 mmol/L   CO2 22 22 - 32 mmol/L   Glucose, Bld 120 (H) 70 - 99 mg/dL    Comment: Glucose reference  range applies only to samples taken after fasting for at least 8 hours.   BUN 39 (H) 8 - 23 mg/dL   Creatinine, Ser 1.44 (H) 0.44 - 1.00 mg/dL   Calcium 8.8 (L) 8.9 - 10.3 mg/dL   GFR calc non Af Amer 34 (L) >60 mL/min   GFR calc Af Amer 40 (L) >60 mL/min   Anion gap 11 5 - 15    Comment: Performed at [facility], [address] [place], [place] [address]  Lactic acid, plasma     Status: Abnormal   Collection Time: 07/24/19  2:30 PM  Result Value Ref Range   Lactic Acid, Venous 2.0 (HH) 0.5 - 1.9 mmol/L    Comment: CRITICAL VALUE NOTED. VALUE IS CONSISTENT WITH PREVIOUSLY REPORTED/CALLED VALUE MJU Performed at [facility], [address] [place], [place] [address]   Urinalysis, Complete w Microscopic     Status: Abnormal   Collection Time: 07/24/19  6:56 PM  Result Value Ref Range   Color, Urine YELLOW (A) YELLOW   APPearance CLOUDY (A) CLEAR   Specific Gravity, Urine 1.021 1.005 - 1.030   pH 5.0 5.0 - 8.0   Glucose, UA NEGATIVE NEGATIVE mg/dL   Hgb urine dipstick MODERATE (A) NEGATIVE   Bilirubin Urine NEGATIVE NEGATIVE   Ketones, ur 5 (A) NEGATIVE mg/dL   Protein, ur 100 (A) NEGATIVE mg/dL   Nitrite NEGATIVE NEGATIVE   Leukocytes,Ua LARGE (A) NEGATIVE   RBC / HPF 11-20 0 - 5 RBC/hpf   WBC, UA >50 (H) 0 - 5 WBC/hpf   Bacteria, UA NONE SEEN NONE SEEN   Squamous Epithelial / LPF 0-5 0 - 5   Mucus PRESENT    Hyaline Casts, UA PRESENT     Comment: Performed at [facility], [address] [place], [place] [address]  Glucose, capillary     Status: Abnormal   Collection Time: 07/24/19 10:30 PM  Result Value Ref Range   Glucose-Capillary 107 (H) 70 - 99 mg/dL    Comment: Glucose reference range applies only to samples taken after fasting for at least 8 hours.   CT Head Wo Contrast  Result Date: 07/24/2019 CLINICAL DATA:  Encephalopathy. EXAM: CT HEAD WITHOUT CONTRAST TECHNIQUE: Contiguous axial images were obtained from the base of the skull  through the vertex without intravenous contrast. COMPARISON:  Noncontrast head CT 07/01/2019, brain MRI 05/25/2019 FINDINGS: Brain: Stable mild ill-defined hypoattenuation within cerebral white matter which is nonspecific, but consistent with chronic small vessel ischemic disease. Stable, mild generalized parenchymal atrophy. Redemonstrated chronic lacunar infarct within the right caudate head (series 3, image 15) There is no acute intracranial hemorrhage. No demarcated cortical infarct. No extra-axial fluid collection. No evidence of intracranial mass. No midline shift. Vascular: No hyperdense vessel.  Atherosclerotic calcifications Skull: Normal. Negative for fracture or focal lesion. Sinuses/Orbits: Visualized orbits show no acute finding. No significant paranasal sinus disease or mastoid effusion at the imaged levels. IMPRESSION: 1. No evidence of acute intracranial abnormality. 2. Stable mild generalized parenchymal atrophy and chronic small vessel ischemic disease. 3. Redemonstrated chronic right basal ganglia lacunar infarct. Electronically Signed   By: Kellie Simmering DO   On: 07/24/2019 14:37   DG Chest Portable 1 View  Result Date: 07/24/2019 CLINICAL DATA:  Altered mental status. EXAM: PORTABLE CHEST 1 VIEW COMPARISON:  Chest radiograph 07/01/2019. Chest CT 03/31/2018 FINDINGS: Lower lung volumes from prior exam. Heart size is normal for technique. Aortic atherosclerosis and tortuosity, unchanged. Hiatal hernia with air-filled stomach projecting over the left lung base, as seen on prior chest CT. No acute airspace disease. No pulmonary edema, pleural effusion, or pneumothorax. No acute osseous abnormalities are seen. IMPRESSION: 1. Lower lung volumes from prior exam. No acute chest finding. 2. Large hiatal hernia. Aortic Atherosclerosis (ICD10-I70.0). Electronically Signed   By: Keith Rake M.D.   On: 07/24/2019 14:01    Pending Labs Unresulted Labs (From admission, onward)    Start     Ordered    07/31/19 0500  Creatinine, serum  (enoxaparin (LOVENOX)    CrCl >/= 30 ml/min)  Weekly,   STAT  Comments: while on enoxaparin therapy    07/24/19 1927   07/25/19 0500  Protime-INR  Tomorrow morning,   STAT     07/24/19 1927   07/25/19 0500  Cortisol-am, blood  Tomorrow morning,   STAT     07/24/19 1927   07/25/19 0500  Procalcitonin  Tomorrow morning,   STAT     07/24/19 1927   07/24/19 1947  SARS Coronavirus 2 by RT PCR (hospital order, performed in [facility] hospital lab) Nasopharyngeal Nasopharyngeal Swab  (Tier 2 (TAT 2 hrs))  Once,   STAT    Question Answer Comment  Is this test for diagnosis or screening Screening   Symptomatic for COVID-19 as defined by CDC No   Hospitalized for COVID-19 No   Admitted to ICU for COVID-19 No   Previously tested for COVID-19 Yes   Resident in a congregate (group) care setting Yes   Employed in healthcare setting No   Pregnant No   Has patient completed COVID vaccination(s) (2 doses of Pfizer/Moderna 1 dose of [employer]) Unknown      07/24/19 1946   07/24/19 1923  Hemoglobin A1c  Once,   STAT    Comments: To assess prior glycemic control    07/24/19 1927   07/24/19 1421  Blood culture (routine x 2)  BLOOD CULTURE X 2,   STAT     07/24/19 1422          Vitals/Pain Today's Vitals   07/24/19 2100 07/24/19 2130 07/24/19 2200 07/24/19 2230  BP: (!) 172/87 (!) 186/105 (!) 182/87 (!) 197/91  Pulse:  (!) 111 95   Resp: 18 18 20 17   Temp:      TempSrc:      SpO2:  95% 100%   Weight:      Height:        Isolation Precautions No active isolations  Medications Medications  sodium chloride flush (NS) 0.9 % injection 3 mL ( Intravenous Canceled Entry 07/24/19 1305)  insulin aspart (novoLOG) injection 0-9 Units (has no administration in time range)  insulin aspart (novoLOG) injection 0-5 Units (0 Units Subcutaneous Not Given 07/24/19 2233)  enoxaparin (LOVENOX) injection 30 mg (has no administration in time range)  0.9 %   sodium chloride infusion ( Intravenous New Bag/Given 07/24/19 2041)  acetaminophen (TYLENOL) tablet 650 mg (has no administration in time range)    Or  acetaminophen (TYLENOL) suppository 650 mg (has no administration in time range)  traMADol (ULTRAM) tablet 50 mg (has no administration in time range)  ondansetron (ZOFRAN) tablet 4 mg (has no administration in time range)    Or  ondansetron (ZOFRAN) injection 4 mg (has no administration in time range)  docusate sodium (COLACE) capsule 100 mg (has no administration in time range)  ceFEPIme (MAXIPIME) 2 g in sodium chloride 0.9 % 100 mL IVPB (has no administration in time range)  sodium chloride 0.9 % bolus 1,000 mL (0 mLs Intravenous Stopped 07/24/19 1605)  ceFEPIme (MAXIPIME) 2 g in sodium chloride 0.9 % 100 mL IVPB (0 g Intravenous Stopped 07/24/19 1605)  lactated ringers bolus 1,000 mL (0 mLs Intravenous Stopped 07/24/19 1638)  sodium chloride 0.9 % bolus 500 mL (500 mLs Intravenous New Bag/Given 07/24/19 2040)    Mobility non-ambulatory High fall risk   Focused Assessments Neuro Assessment Handoff:  Swallow screen pass? Yes          Neuro Assessment: Exceptions to WDL Neuro Checks:      Last Documented NIHSS Modified  Score:   Has TPA been given? Yes Temp: 99.6 F (37.6 C) (05/18 1522) Temp Source: Rectal (05/18 1522) BP: 197/91 (05/18 2230) Pulse Rate: 95 (05/18 2200) If patient is a Neuro Trauma and patient is going to OR before floor call report to [place] nurse: [phone] or [phone]  , Renal Assessment Handoff:    Restricted appendage: right arm     R Recommendations: See Admitting Provider Note  Report given to:   Additional Notes:

## 2019-07-24 NOTE — Progress Notes (Signed)
CODE SEPSIS - PHARMACY COMMUNICATION  **Broad Spectrum Antibiotics should be administered within 1 hour of Sepsis diagnosis**  Time Code Sepsis Called/Page Received: 1430  Antibiotics Ordered: cefepime  Time of 1st antibiotic administration: 1505  Additional action taken by pharmacy: NA  If necessary, Name of Provider/Nurse Contacted: NA    Tawnya Crook ,PharmD Clinical Pharmacist  07/24/2019  3:06 PM

## 2019-07-24 NOTE — Progress Notes (Signed)
Cath Change/ Replacement  Patient is present today for a catheter change due to urinary retention.  55ml of water was removed from the balloon, a 16FR foley cath was removed without difficulty.  Patient was cleaned and prepped in a sterile fashion with betadine. A 16 FR foley cath was replaced into the bladder no complications were noted Urine return was noted 75ml and urine was yellow in color. The balloon was filled with 76ml of sterile water. A night bag was attached for drainage.  Patient was given proper instruction on catheter care.    Performed by: Debroah Loop, PA-C

## 2019-07-24 NOTE — Progress Notes (Signed)
07/24/2019 8:33 AM   [name] [name] 11/10/1940 [id number]  CC: Chief Complaint  Patient presents with  . Urinary Retention   HPI: [name] [name] is a 79 y.o. 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 coli bacteremia and severe sepsis; urine culture with E. coli and Klebsiella species. She was treated with 7 days of ceftriaxone and Omnicef and discharged to SNF on 07/12/2019 and presents to clinic today for outpatient follow-up.  Foley catheter in place today draining cloudy yellow urine.  It appears this is the same catheter I placed in clinic on 06/26/2019.  Today, patient reports feeling well.  She is notably more confused today than I have previously seen her: trying to put on glasses that she does not have, mixing up her family member's names, bringing her hand to her mouth with pursed lips in an attempt to drink a beverage that is not there.  Upon discussion with her grandson, he reports her dementia has become markedly worse following her recent hospitalizations and this is consistent with her new baseline.  PMH: Past Medical History:  Diagnosis Date  . Breast cancer ([place]) 2018   Right breast  . 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 Mucin noted on biopsy, not on wide excision.  . Coronary artery disease   . GERD (gastroesophageal reflux disease)   . Hyperlipidemia   . Hypertension   . 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 Dr. Kellie Moor DX by  punch bioppsy  . Lichen planus    right breast   . Lichen planus   . MI (myocardial infarction) ([place])    2015  . Personal history of radiation therapy 09/2016   RIGHT lumpectomy w/ radiation    Surgical History: Past Surgical History:  Procedure Laterality Date  . ABDOMINAL HYSTERECTOMY    . APPENDECTOMY    . BREAST BIOPSY Right 07/23/2016   INVASIVE MAMMARY CARCINOMA WITH AREAS OF EXTRACELLULAR MUCIN  . BREAST EXCISIONAL BIOPSY     INVASIVE MUCINOUS MAMMARY CARCINOMA.   Marland Kitchen BREAST LUMPECTOMY Right 08/20/2016   INVASIVE MUCINOUS MAMMARY CARCINOMA. /Grade 2   . COLONOSCOPY  2016  . COLONOSCOPY N/A 09/06/2018   Procedure: COLONOSCOPY;  Surgeon: Virgel Manifold, MD;  Location: [facility] ENDOSCOPY;  Service: Endoscopy;  Laterality: N/A;  . CORONARY ANGIOPLASTY WITH STENT PLACEMENT    . CYSTOSCOPY    . CYSTOSCOPY WITH URETHRAL DILATATION N/A 06/12/2019   Procedure: CYSTOSCOPY WITH URETHRAL DILATATION;  Surgeon: Abbie Sons, MD;  Location: [facility] ORS;  Service: Urology;  Laterality: N/A;  . ESOPHAGOGASTRODUODENOSCOPY N/A 09/06/2018   Procedure: ESOPHAGOGASTRODUODENOSCOPY (EGD);  Surgeon: Virgel Manifold, MD;  Location: [facility] ENDOSCOPY;  Service: Endoscopy;  Laterality: N/A;  . OOPHORECTOMY    . PARTIAL MASTECTOMY WITH AXILLARY SENTINEL LYMPH NODE BIOPSY Right 08/09/2016   Procedure: PARTIAL MASTECTOMY WITH AXILLARY SENTINEL LYMPH NODE BIOPSY;  Surgeon: Robert Bellow, MD;  Location: [facility] ORS;  Service: General;  Laterality: Right;  . TONSILLECTOMY      Home Medications:  Allergies as of 07/24/2019      Reactions   Celebrex  [  celecoxib]    Dark stools.   Penicillins Other (See Comments)   Childhood reaction Has patient had a PCN reaction causing immediate rash, facial/tongue/throat swelling, SOB or lightheadedness with hypotension: Unknown Has patient had a PCN reaction causing severe rash involving mucus membranes or skin necrosis: Unknown Has patient had a PCN reaction that  required hospitalization: Unknown Has patient had a PCN reaction occurring within the last 10 years: Unknown If all of the above answers are "NO", then may proceed with Cephalosporin use.   Prevacid [lansoprazole] Diarrhea      Medication List       Accurate as of Jul 24, 2019  8:33 AM. If you have any questions, ask your nurse or doctor.        acetaminophen 650 MG CR tablet Commonly known as: TYLENOL Take 1,300 mg by mouth every 8 (eight) hours as needed for pain.   CALCIUM 500+D PO Take by mouth every other day.   haloperidol 0.5 MG tablet Commonly known as: HALDOL Take 1 tablet (0.5 mg total) by mouth daily as needed for agitation.   hydrALAZINE 25 MG tablet Commonly known as: APRESOLINE Take 1 tablet (25 mg total) by mouth every 12 (twelve) hours as needed (For standing SBP above 160 or standing DBP above 100).   letrozole 2.5 MG tablet Commonly known as: FEMARA Take 2.5 mg by mouth daily.   midodrine 10 MG tablet Commonly known as: PROAMATINE Take 1 tablet (10 mg total) by mouth 3 (three) times daily with meals.   omeprazole 20 MG capsule Commonly known as: PRILOSEC TAKE 1 CAPSULE (20 MG TOTAL) BY MOUTH DAILY AS NEEDED (INDIGESTION). What changed: See the new instructions.   senna-docusate 8.6-50 MG tablet Commonly known as: Senokot-S Take 2 tablets by mouth 2 (two) times daily as needed for mild constipation.       Allergies:  Allergies  Allergen Reactions  . Celebrex  [Celecoxib]     Dark stools.  . Penicillins Other (See Comments)    Childhood reaction Has patient had a PCN reaction causing immediate rash, facial/tongue/throat swelling, SOB or lightheadedness with hypotension: Unknown Has patient had a PCN reaction causing severe rash involving mucus membranes or skin necrosis: Unknown Has patient had a PCN reaction that required hospitalization: Unknown Has patient had a PCN reaction occurring within the last 10 years: Unknown If all of the above  answers are "NO", then may proceed with Cephalosporin use.   Marland Kitchen Prevacid [Lansoprazole] Diarrhea    Family History: Family History  Problem Relation Age of Onset  . Hyperlipidemia Mother   . Allergies Mother   . Cerebral aneurysm Mother        cause of death at age 28  . Heart disease Father        Fatal MI ag 16  . Cancer Brother        lung cancer  . Hyperlipidemia Brother   . Colonic polyp Brother   . Healthy Son   . Cancer - Lung Brother        colon  . Healthy Son   . Breast cancer Neg Hx     Social History:   reports that she quit smoking about 21 years ago. Her smoking use included cigarettes. She has a 2.50 pack-year smoking history. She has never used smokeless tobacco. She reports that she does not drink alcohol or use drugs.  Physical Exam: BP 100/72   Pulse 69   Ht 5\' 5"  (1.651 m)   Wt  135 lb (61.2 kg)   BMI 22.47 kg/m   Constitutional:  Alert, no acute distress, nontoxic appearing HEENT: [place], AT Cardiovascular: No clubbing, cyanosis, or edema Respiratory: Normal respiratory effort, no increased work of breathing Skin: No rashes, bruises or suspicious lesions Neurologic: Grossly intact, no focal deficits, moving all 4 extremities Psychiatric: Normal mood; dementia noted--see HPI  Assessment & Plan:   1. Urinary retention I had a lengthy conversation with the patient, her husband, her son via telephone, and her grandson in clinic today regarding her management options moving forward.  Patient requires urinary management, however given her recent decline in cognitive status, I no longer feel she is an appropriate candidate for CIC.  Ultimately, I recommended indwelling urinary catheterization for management of her chronic retention, either via urethral Foley vs. SP tube. Family agrees with chronic indwelling urethral Foley today. I am in agreement with this plan.   Foley exchanged today; see separate procedure note for details. We administered a dose of  ceftriaxone IM in clinic today for UTI prevention given her recent hospitalization and with concerns for disrupting bacterial biofilm with catheter exchange. Patient was not noted to have urinary return for approximately 30 minutes following catheterization despite repeated confirmation of appropriate Foley placement in clinic. Patient remained in clinic, was provided ginger ale to drink, and ultimately clear yellow urine return was noted and she was sent home with plans for follow-up in 1 month for cath exchange. - cefTRIAXone (ROCEPHIN) injection 1,000 mg   Return in about 4 weeks (around 08/21/2019) for Catheter exchange.  I spent 35 minutes on the day of the encounter to include pre-visit record review, face-to-face time with the patient, and post-visit ordering of tests.   Debroah Loop, PA-C  [facility] [address] [place], [place] [address] [phone]

## 2019-07-24 NOTE — H&P (Addendum)
History and Physical    Taygan Whittiker Satterly [id number] DOB: 07/27/1940 DOA: 07/24/2019  PCP: Jerrol Banana., MD   Patient coming from: [facility]  I have personally briefly reviewed patient's old medical records in [place]  Chief Complaint: Altered mental status, hypotension  HPI: [name] [name] is a 79 y.o. female with medical history significant for diabetes, HTN, orthostatic hypotension on midodrine, breast cancer on letrozole, with more recent history of severe urinary retention requiring chronic Foley, length overload plan to there has been on Legrand [place] he is nonsense well the last report he did well ~she is adamant about going to the medical distinctive for, and followed by urology most recently today on 07/24/2019 for Foley catheter change, and 2 recent past hospitalizations for sepsis secondary to UTI, who was sent to the emergency room for evaluation of altered mental status and hypotension with SBP of 60 at her residence.  Patient went to see her urologist earlier this morning and was in her usual state of health except for appearing a bit more confused which they attributed to her dementia.  She went back to the nursing home and remained her usual self until later she was found to be unresponsive with blood pressure 60/30 and EMS was called to take her to the ER.   ED Course: Upon arrival to the ER, she was still very lethargic but arousable but sometimes slipping into brief periods Of unresponsiveness. febrile at 99.6, BP 115/50 with pulse of 122.  Blood work revealed leukocytosis of 11,600 with lactic acid 4.44>>2.  Creatinine 1.44 above baseline of 0.97.  Urinalysis strongly consistent with UTI.  CT head showed no acute intracranial findings.  Patient was aggressively hydrated with improvement in lactate to 2.  Blood pressure improved to 165/82 by admission and she actually became hypertensive at 204/107.  She was started on IV cefepime.  Hospitalist consulted for  admission.  Review of Systems: Patient is confused . Unable to perform   Past Medical History:  Diagnosis Date  . Breast cancer ([place]) 2018   Right breast  . 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 Mucin noted on biopsy, not on wide excision.  . Coronary artery disease   . GERD (gastroesophageal reflux disease)   . Hyperlipidemia   . Hypertension   . 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 Dr. Kellie Moor DX by punch bioppsy  . Lichen planus    right breast   . Lichen planus   . MI (myocardial infarction) ([place])    2015  . Personal history of radiation therapy 09/2016   RIGHT lumpectomy w/ radiation    Past Surgical History:  Procedure Laterality Date  . ABDOMINAL HYSTERECTOMY    . APPENDECTOMY    . BREAST BIOPSY Right 07/23/2016   INVASIVE MAMMARY CARCINOMA WITH AREAS OF EXTRACELLULAR MUCIN  . BREAST EXCISIONAL BIOPSY     INVASIVE MUCINOUS MAMMARY CARCINOMA.   Marland Kitchen BREAST LUMPECTOMY Right 08/20/2016   INVASIVE MUCINOUS MAMMARY CARCINOMA. /Grade 2   . COLONOSCOPY  2016  . COLONOSCOPY N/A 09/06/2018   Procedure: COLONOSCOPY;  Surgeon: Virgel Manifold, MD;  Location: [facility] ENDOSCOPY;  Service: Endoscopy;  Laterality: N/A;  . CORONARY ANGIOPLASTY WITH STENT PLACEMENT    . CYSTOSCOPY    . CYSTOSCOPY WITH URETHRAL DILATATION N/A 06/12/2019   Procedure: CYSTOSCOPY WITH URETHRAL DILATATION;  Surgeon: Abbie Sons, MD;  Location: [facility] ORS;  Service: Urology;  Laterality: N/A;  . ESOPHAGOGASTRODUODENOSCOPY N/A 09/06/2018   Procedure: ESOPHAGOGASTRODUODENOSCOPY (EGD);  Surgeon: Virgel Manifold, MD;  Location[facility] ENDOSCOPY;  Service: Endoscopy;  Laterality: N/A;  . OOPHORECTOMY    . PARTIAL MASTECTOMY WITH AXILLARY SENTINEL LYMPH NODE BIOPSY Right 08/09/2016   Procedure: PARTIAL MASTECTOMY WITH AXILLARY SENTINEL LYMPH NODE BIOPSY;  Surgeon: Robert Bellow, MD;  Location: [facility] ORS;  Service:  General;  Laterality: Right;  . TONSILLECTOMY       reports that she quit smoking about 21 years ago. Her smoking use included cigarettes. She has a 2.50 pack-year smoking history. She has never used smokeless tobacco. She reports that she does not drink alcohol or use drugs.  Allergies  Allergen Reactions  . Celebrex  [Celecoxib]     Dark stools.  . Penicillins Other (See Comments)    Childhood reaction Has patient had a PCN reaction causing immediate rash, facial/tongue/throat swelling, SOB or lightheadedness with hypotension: Unknown Has patient had a PCN reaction causing severe rash involving mucus membranes or skin necrosis: Unknown Has patient had a PCN reaction that required hospitalization: Unknown Has patient had a PCN reaction occurring within the last 10 years: Unknown If all of the above answers are "NO", then may proceed with Cephalosporin use.   Marland Kitchen Prevacid [Lansoprazole] Diarrhea    Family History  Problem Relation Age of Onset  . Hyperlipidemia Mother   . Allergies Mother   . Cerebral aneurysm Mother        cause of death at age 42  . Heart disease Father        Fatal MI ag 67  . Cancer Brother        lung cancer  . Hyperlipidemia Brother   . Colonic polyp Brother   . Healthy Son   . Cancer - Lung Brother        colon  . Healthy Son   . Breast cancer Neg Hx      Prior to Admission medications   Medication Sig Start Date End Date Taking? Authorizing Provider  acetaminophen (TYLENOL) 650 MG CR tablet Take 1,300 mg by mouth every 8 (eight) hours as needed for pain.   Yes [provider]  Calcium Carb-Cholecalciferol (CALCIUM 500+D) 500-200 MG-UNIT TABS Take 1 tablet by mouth every other day.    Yes [provider]  letrozole (FEMARA) 2.5 MG tablet Take 2.5 mg by mouth daily.   Yes [provider]  midodrine (PROAMATINE) 10 MG tablet Take 1 tablet (10 mg total) by mouth 3 (three) times daily with meals. 07/10/19  Yes Mercy Riding, MD    omeprazole (PRILOSEC) 20 MG capsule TAKE 1 CAPSULE (20 MG TOTAL) BY MOUTH DAILY AS NEEDED (INDIGESTION). Patient taking differently: Take 20 mg by mouth daily.  06/12/19  Yes Jerrol Banana., MD  senna-docusate (SENOKOT-S) 8.6-50 MG tablet Take 2 tablets by mouth 2 (two) times daily as needed for mild constipation. 06/28/19  Yes Sharen Hones, MD  haloperidol (HALDOL) 0.5 MG tablet Take 1 tablet (0.5 mg total) by mouth daily as needed for agitation. Patient not taking: Reported on 07/24/2019 07/12/19   Regalado, Jerald Kief A, MD  hydrALAZINE (APRESOLINE) 25 MG tablet Take 1 tablet (25 mg total) by mouth every 12 (twelve) hours as needed (For standing SBP above 160 or standing DBP above 100). Patient not taking: Reported on 07/24/2019 07/10/19   Mercy Riding, MD    Physical Exam: Vitals:   07/24/19 1800 07/24/19 1830  07/24/19 1900 07/24/19 1901  BP: (!) 169/96 (!) 165/82  (!) 204/107  Pulse: 87 88 88   Resp: 17 (!) 21 17 (!) 22  Temp:      TempSrc:      SpO2: 99% 100% 100%   Weight:      Height:         Vitals:   07/24/19 1800 07/24/19 1830 07/24/19 1900 07/24/19 1901  BP: (!) 169/96 (!) 165/82  (!) 204/107  Pulse: 87 88 88   Resp: 17 (!) 21 17 (!) 22  Temp:      TempSrc:      SpO2: 99% 100% 100%   Weight:      Height:        Constitutional: Alert and awake, oriented x1, not in any acute distress. Eyes: PERLA, EOMI, irises appear normal, anicteric sclera,  ENMT: external ears and nose appear normal, normal hearing             Lips appears normal, oropharynx mucosa, tongue, posterior pharynx appear normal  Neck: neck appears normal, no masses, normal ROM, no thyromegaly, no JVD  CVS: S1-S2 clear, no murmur rubs or gallops,  , no carotid bruits, pedal pulses palpable, No LE edema Respiratory:  clear to auscultation bilaterally, no wheezing, rales or rhonchi. Respiratory effort normal. No accessory muscle use.  Abdomen: soft nontender, nondistended, normal bowel sounds, no  hepatosplenomegaly, no hernias Musculoskeletal: : no cyanosis, clubbing , no contractures or atrophy Neuro: Cranial nerves II-XII intact, sensation, reflexes normal, strength Psych: confused, oriented x 1 Skin: no rashes or lesions or ulcers, no induration or nodules   Labs on Admission: I have personally reviewed following labs and imaging studies  CBC: Recent Labs  Lab 07/24/19 1333  WBC 11.6*  HGB 11.3*  HCT 36.2  MCV 90.0  PLT [id number]   Basic Metabolic Panel: Recent Labs  Lab 07/24/19 1425  NA 139  K 3.9  CL 106  CO2 22  GLUCOSE 120*  BUN 39*  CREATININE 1.44*  CALCIUM 8.8*   GFR: Estimated Creatinine Clearance: 28 mL/min (A) (by C-G formula based on SCr of 1.44 mg/dL (H)). Liver Function Tests: No results for input(s): AST, ALT, ALKPHOS, BILITOT, PROT, ALBUMIN in the last 168 hours. No results for input(s): LIPASE, AMYLASE in the last 168 hours. No results for input(s): AMMONIA in the last 168 hours. Coagulation Profile: No results for input(s): INR, PROTIME in the last 168 hours. Cardiac Enzymes: No results for input(s): CKTOTAL, CKMB, CKMBINDEX, TROPONINI in the last 168 hours. BNP (last 3 results) No results for input(s): PROBNP in the last 8760 hours. HbA1C: No results for input(s): HGBA1C in the last 72 hours. CBG: No results for input(s): GLUCAP in the last 168 hours. Lipid Profile: No results for input(s): CHOL, HDL, LDLCALC, TRIG, CHOLHDL, LDLDIRECT in the last 72 hours. Thyroid Function Tests: No results for input(s): TSH, T4TOTAL, FREET4, T3FREE, THYROIDAB in the last 72 hours. Anemia Panel: No results for input(s): VITAMINB12, FOLATE, FERRITIN, TIBC, IRON, RETICCTPCT in the last 72 hours. Urine analysis:    Component Value Date/Time   COLORURINE YELLOW (A) 07/24/2019 1856   APPEARANCEUR CLOUDY (A) 07/24/2019 1856   APPEARANCEUR Cloudy (A) 04/25/2015 1449   LABSPEC 1.021 07/24/2019 1856   LABSPEC 1.021 01/17/2014 1524   PHURINE 5.0 07/24/2019  1856   GLUCOSEU NEGATIVE 07/24/2019 1856   GLUCOSEU Negative 01/17/2014 1524   HGBUR MODERATE (A) 07/24/2019 1856   BILIRUBINUR NEGATIVE 07/24/2019 1856   BILIRUBINUR neg  06/30/2016 1619   BILIRUBINUR Negative 04/25/2015 1449   BILIRUBINUR Negative 01/17/2014 1524   KETONESUR 5 (A) 07/24/2019 1856   PROTEINUR 100 (A) 07/24/2019 1856   UROBILINOGEN 0.2 06/30/2016 1619   NITRITE NEGATIVE 07/24/2019 1856   LEUKOCYTESUR LARGE (A) 07/24/2019 1856   LEUKOCYTESUR 2+ 01/17/2014 1524    Radiological Exams on Admission: CT Head Wo Contrast  Result Date: 07/24/2019 CLINICAL DATA:  Encephalopathy. EXAM: CT HEAD WITHOUT CONTRAST TECHNIQUE: Contiguous axial images were obtained from the base of the skull through the vertex without intravenous contrast. COMPARISON:  Noncontrast head CT 07/01/2019, brain MRI 05/25/2019 FINDINGS: Brain: Stable mild ill-defined hypoattenuation within cerebral white matter which is nonspecific, but consistent with chronic small vessel ischemic disease. Stable, mild generalized parenchymal atrophy. Redemonstrated chronic lacunar infarct within the right caudate head (series 3, image 15) There is no acute intracranial hemorrhage. No demarcated cortical infarct. No extra-axial fluid collection. No evidence of intracranial mass. No midline shift. Vascular: No hyperdense vessel.  Atherosclerotic calcifications Skull: Normal. Negative for fracture or focal lesion. Sinuses/Orbits: Visualized orbits show no acute finding. No significant paranasal sinus disease or mastoid effusion at the imaged levels. IMPRESSION: 1. No evidence of acute intracranial abnormality. 2. Stable mild generalized parenchymal atrophy and chronic small vessel ischemic disease. 3. Redemonstrated chronic right basal ganglia lacunar infarct. Electronically Signed   By: Kellie Simmering DO   On: 07/24/2019 14:37   DG Chest Portable 1 View  Result Date: 07/24/2019 CLINICAL DATA:  Altered mental status. EXAM: PORTABLE  CHEST 1 VIEW COMPARISON:  Chest radiograph 07/01/2019. Chest CT 03/31/2018 FINDINGS: Lower lung volumes from prior exam. Heart size is normal for technique. Aortic atherosclerosis and tortuosity, unchanged. Hiatal hernia with air-filled stomach projecting over the left lung base, as seen on prior chest CT. No acute airspace disease. No pulmonary edema, pleural effusion, or pneumothorax. No acute osseous abnormalities are seen. IMPRESSION: 1. Lower lung volumes from prior exam. No acute chest finding. 2. Large hiatal hernia. Aortic Atherosclerosis (ICD10-I70.0). Electronically Signed   By: Keith Rake M.D.   On: 07/24/2019 14:01    EKG: Independently reviewed.   Assessment/Plan Principal Problem:    Severe sepsis ([place])   Recurrent sepsis due to urinary tract infection ([place])   Urinary tract infection associated with indwelling urethral catheter ([place]) -Patient presented with acute confusion and unresponsiveness, low-grade fever, tachycardia, leukocytosis with endorgan effects of hypotension, elevated lactic acid 4.4 and AKI.  Urinalysis consistent with UTI -Recently hospitalized from 07/01/2019 to 07/12/2019 with sepsis secondary to UTI -Responded to IV fluid bolus in the emergency room with improvement in blood pressure and mentation -Continue IV fluids -Continue IV cefepime -Follow blood and urine cultures -Foley was changed on 07/24/2019 at urology office  Chronic indwelling Foley catheter Urethral stricture -Patient has known urethral stricture followed by urology and has indwelling Foley catheter, last seen 07/24/19 -Foley catheter change in the outpatient on the morning of arrival on 07/24/2019.    AKI (acute kidney injury) ([place]) -Secondary to sepsis -Creatinine 1.44 elevated above 0.97 baseline -Should improve with IV hydration and treatment of sepsis    Acute metabolic encephalopathy, superimposed on baseline of dementia   Possible syncope related to orthostatic  hypotension -Patient was found unresponsive/minimally responsive at the nursing home -Secondary to sepsis above, possible syncopal event orthostatic hypotension/autonomic dysfunction(in view of rapid rebound in BP) -Already improving.  Continue to monitor -Fall and aspiration precautions -Head CT showed no acute disease.  Orthostatic hypotension/supine hypertension --BP 60/30 prior to arrival  but BP very elevated after fluid bolus, as high as 197/91 --currently on midodrine --Evaluated by neurology in Jan 2021 for dizziness and syncope with consideration for starting florinef for autonomic hypotension --continue to monitor closely --fall precautions, compressive stockings    Diabetes mellitus (HCC) -Regular insulin sliding scale for now    History of breast cancer -Continue letrozole    Dementia without behavioral disturbance (HCC) -No behavioral disturbance    DVT prophylaxis: Lovenox  Code Status: DNR  Family Communication:  none  Disposition Plan: Back to previous home environment Consults called: none  Status:At the time of admission, it appears that the appropriate admission status for this patient is INPATIENT. This is judged to be reasonable and necessary in order to provide the required intensity of service to ensure the patient's safety given the presenting symptoms, physical exam findings, and initial radiographic and laboratory data in the context of their  Comorbid conditions.   Patient requires inpatient status due to high intensity of service, high risk for further deterioration and high frequency of surveillance required.   I certify that at the point of admission it is my clinical judgment that the patient will require inpatient hospital care spanning beyond [place] MD [facility]     07/24/2019, 7:27 PM

## 2019-07-24 NOTE — ED Notes (Signed)
Called floor to give report, unable to reach RN. Left callback number for any questions, will call before transport

## 2019-07-24 NOTE — ED Triage Notes (Signed)
Pt comes into the ED via EMS from [facility] with c/o pt being unresponsive, syncople episode 53/30 b/p at [facility], EMS 118/64, 96%RA, CBG 119, 99.4temp, 80HR never unresponsive with EMS, pt has hx of dementia.

## 2019-07-24 NOTE — ED Provider Notes (Signed)
Procedures     ----------------------------------------- 5:20 PM on 07/24/2019 ----------------------------------------- Sepsis reassessment has been completed  After 30 mL/kg IV fluid bolus, blood pressure is normal, lactate is improved to 2.0 on repeat.  No signs of shock currently.  Vasopressors not indicated.  Still awaiting urinalysis.     Carrie Mew, MD 07/24/19 [phone]

## 2019-07-24 NOTE — Progress Notes (Signed)
PHARMACIST - PHYSICIAN COMMUNICATION  CONCERNING:  Enoxaparin (Lovenox) for DVT Prophylaxis    RECOMMENDATION: Patient was prescribed enoxaparin 40 mg q24 hours for VTE prophylaxis.   Filed Weights   07/24/19 1244  Weight: 61.2 kg (135 lb)    Body mass index is 22.47 kg/m.  Estimated Creatinine Clearance: 28 mL/min (A) (by C-G formula based on SCr of 1.44 mg/dL (H)).   Based on [place] patient is candidate for enoxaparin 30 mg every 24 hours based on CrCl < 17ml/min  DESCRIPTION: Pharmacy has adjusted enoxaparin dose per [facility] policy.  Patient is now receiving enoxaparin 30mg  every 24 hours.  Tawnya Crook, PharmD Clinical Pharmacist  07/24/2019 7:35 PM

## 2019-07-25 DIAGNOSIS — I951 Orthostatic hypotension: Secondary | ICD-10-CM

## 2019-07-25 DIAGNOSIS — R6520 Severe sepsis without septic shock: Secondary | ICD-10-CM

## 2019-07-25 DIAGNOSIS — A419 Sepsis, unspecified organism: Secondary | ICD-10-CM

## 2019-07-25 DIAGNOSIS — L8990 Pressure ulcer of unspecified site, unspecified stage: Secondary | ICD-10-CM | POA: Insufficient documentation

## 2019-07-25 LAB — PROCALCITONIN: Procalcitonin: 0.23 ng/mL

## 2019-07-25 LAB — GLUCOSE, CAPILLARY
Glucose-Capillary: 77 mg/dL (ref 70–99)
Glucose-Capillary: 107 mg/dL — ABNORMAL HIGH (ref 70–99)
Glucose-Capillary: 93 mg/dL (ref 70–99)
Glucose-Capillary: 89 mg/dL (ref 70–99)

## 2019-07-25 LAB — PROTIME-INR
INR: 1.1 (ref 0.8–1.2)
Prothrombin Time: 13.4 seconds (ref 11.4–15.2)

## 2019-07-25 LAB — CORTISOL-AM, BLOOD: Cortisol - AM: 19.7 ug/dL (ref 6.7–22.6)

## 2019-07-25 LAB — HEMOGLOBIN A1C
Hgb A1c MFr Bld: 5.6 % (ref 4.8–5.6)
Mean Plasma Glucose: 114 mg/dL

## 2019-07-25 LAB — MRSA PCR SCREENING: MRSA by PCR: NEGATIVE

## 2019-07-25 MED ORDER — CHLORHEXIDINE GLUCONATE CLOTH 2 % EX PADS
6.0000 | MEDICATED_PAD | Freq: Every day | CUTANEOUS | Status: DC
  Administered 2019-07-25 – 2019-08-02 (×9): 6 via TOPICAL

## 2019-07-25 MED ORDER — HYDRALAZINE HCL 25 MG PO TABS
25.0000 mg | ORAL_TABLET | Freq: Three times a day (TID) | ORAL | Status: DC
  Administered 2019-07-25: 25 mg via ORAL
  Filled 2019-07-25: qty 1

## 2019-07-25 NOTE — TOC Initial Note (Signed)
Transition of Care [facility]) - Initial/Assessment Note    Patient Details  Name: [name] [name] MRN: [id number] Date of Birth: 1940/12/07  Transition of [facility]) CM/SW Contact:    Beverly Sessions, RN Phone Number: 07/25/2019, 3:12 PM  Clinical Narrative:                 Patient admitted from [facility] for severe sepsis.  Patient was discharged to [facility] on 07/12/19  Patient son at the bedside and confirms the families wishes is for patient to return to [facility] at discharge. Voicemail left for patient's husband  Prior to last month patient lived at home with husband.   PCP Rosanna Randy and husband transports to appointments  [facility]  Has been [facility] with [facility] home health in the past  PT eval pending RNCM confirmed with insurance that I am unable to start insurance authorization until PT evaluation available.  MD notified  Confirmed with Tammy at [facility] that patient can return at discharge  Expected Discharge Plan: [place] Barriers to Discharge: Continued Medical Work up   Patient Goals and CMS Choice        Expected Discharge Plan and Services Expected Discharge Plan: [place]       Living arrangements for the past 2 months: [place]                                      Prior Living Arrangements/Services Living arrangements for the past 2 months: [place] Lives with:: Facility Resident                   Activities of Daily Living Home Assistive Devices/Equipment: None ADL Screening (condition at time of admission) Patient's cognitive ability adequate to safely complete daily activities?: No Is the patient deaf or have difficulty hearing?: No Does the patient have difficulty seeing, even when wearing glasses/contacts?: No Does the patient have difficulty concentrating, remembering, or making decisions?: Yes Patient able to express need for assistance with ADLs?: No Does the patient  have difficulty dressing or bathing?: Yes Independently performs ADLs?: No Communication: Independent Does the patient have difficulty walking or climbing stairs?: Yes Weakness of Legs: Right Weakness of Arms/Hands: None  Permission Sought/Granted                  Emotional Assessment              Admission diagnosis:  Cystitis [N30.90] Severe sepsis ([place]) [A41.9, R65.20] Sepsis without acute organ dysfunction, due to unspecified organism [facility]) [A41.9] Patient Active Problem List   Diagnosis Date Noted  . Orthostatic hypotension 07/25/2019  . Pressure injury of skin 07/25/2019  . Dementia without behavioral disturbance ([place]) 07/24/2019  . Recurrent sepsis due to urinary tract infection ([place]) 07/24/2019  . Acute metabolic encephalopathy [id number]  . Urethral stricture 07/24/2019  . Chronic indwelling Foley catheter 07/24/2019  . Severe sepsis ([place]) 07/01/2019  . Syncope 06/27/2019  . History of breast cancer 06/12/2019  . AKI (acute kidney injury) ([place]) 06/12/2019  . Generalized weakness 06/12/2019  . Hyperkalemia 06/12/2019  . Hypertensive urgency 06/12/2019  . Disorder of bursae of shoulder region 10/20/2018  . Lower GI bleeding 09/10/2018  . Hiatal hernia   . Melena   . Polyp of ascending colon   . Diverticulosis of large intestine without diverticulitis   . GIB (gastrointestinal bleeding) 09/05/2018  . LVH (  left ventricular hypertrophy) due to hypertensive disease, without heart failure 04/10/2018  . Chest pain 03/31/2018  . Vasovagal syncope 03/20/2018  . Lichen planus [id number]  . Malignant neoplasm of upper-inner quadrant of right breast in female, estrogen receptor positive ([place]) 07/28/2016  . Gastric ulcer requiring drug therapy, chronic 01/23/2016  . MI (mitral incompetence) 07/22/2015  . Combined fat and carbohydrate induced hyperlipemia 12/26/2014  . Urinary tract infection associated with indwelling urethral catheter ([place]) 08/18/2014  . Blurry  vision 08/18/2014  . Allergic rhinitis 07/12/2014  . Absolute anemia 07/12/2014  . Baker's cyst of knee 07/12/2014  . Atherosclerosis of coronary artery 07/12/2014  . CAFL (chronic airflow limitation) ([place]) 07/12/2014  . Dizziness 07/12/2014  . Acid reflux 07/12/2014  . 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 History of colon polyps 07/12/2014  . Hypercholesteremia 07/12/2014  . Malaise and fatigue 07/12/2014  . Heart attack ([place]) 07/12/2014  . Muscle ache 07/12/2014  . Arthritis, degenerative 07/12/2014  . Bradycardia 08/23/2013  . CAD (coronary artery disease) 08/17/2013  . Diabetes ([place]) 08/17/2013  . Diabetes mellitus ([place]) 08/17/2013  . Peripheral vascular disease ([place]) 08/17/2013   PCP:  Jerrol Banana., MD Pharmacy:   [id number] [address], [place] [address] Phone: [phone] Fax: [phone]  [facility]) - [address], [place] [address] Phone: [phone] Fax: [phone]     Social Determinants of Health (SDOH) Interventions    Readmission Risk Interventions Readmission Risk Prevention Plan 07/25/2019 07/04/2019  Transportation Screening Complete Complete  PCP or Specialist Appt within 3-5 Days - Complete  HRI or [place] - Complete  Medication Review (RN Care Manager) Complete Referral to Pharmacy  Palliative Care Screening Not Applicable -  [place] Complete -  Some recent data might be hidden

## 2019-07-25 NOTE — Progress Notes (Signed)
Assumed care of patient this AM. Patient alert to self. Has a chronic foley. Denies pain at this time. Patient found to have incont. Stool and changed. Placed on tele showing NSR. Slight coccyx redness noted. And discoloration to the right breast from radiation. No acute findings noted at this time. Safety checks completed and call light placed within reach.

## 2019-07-25 NOTE — Progress Notes (Addendum)
07/25/19 1609  What Happened  Was fall witnessed? No  Was patient injured? No  Patient found on floor  Found by Staff-comment  Stated prior activity other (comment) (bed, got self out of low bed)  Follow Up  MD notified Dr. Posey Pronto notified  Time MD notified [phone]  Family notified Yes - comment (son in the room sleeping while pt sitting on floor)  Time family notified 1609  Additional tests No  Simple treatment Other (comment) (assessment)  Progress note created (see row info) Yes  Adult Fall Risk Assessment  Risk Factor Category (scoring not indicated) Fall has occurred during this admission (document High fall risk)  Age 79  Fall History: Fall within 6 months prior to admission 0  Elimination; Bowel and/or Urine Incontinence 2  Elimination; Bowel and/or Urine Urgency/Frequency 2  Medications: includes PCA/Opiates, Anti-convulsants, Anti-hypertensives, Diuretics, Hypnotics, Laxatives, Sedatives, and Psychotropics 5  Patient Care Equipment 2  Mobility-Assistance 2  Mobility-Gait 2  Mobility-Sensory Deficit 0  Altered awareness of immediate physical environment 1  Impulsiveness 0  Lack of understanding of one's physical/cognitive limitations 4  Total Score 23  Patient Fall Risk Level High fall risk  Adult Fall Risk Interventions  Required Bundle Interventions *See Row Information* High fall risk - low, moderate, and high requirements implemented  Additional Interventions Room near nurses station  Screening for Fall Injury Risk (To be completed on HIGH fall risk patients) - Assessing Need for Low Bed  Risk For Fall Injury- Low Bed Criteria Previous fall this admission  Will Implement Low Bed and Floor Mats Yes  Screening for Fall Injury Risk (To be completed on HIGH fall risk patients who do not meet crieteria for Low Bed) - Assessing Need for Floor Mats Only  Risk For Fall Injury- Criteria for Floor Mats Confusion/dementia (+NuDESC, CIWA, TBI, etc.);Noncompliant with safety  precautions  Will Implement Floor Mats Yes  Vitals  Temp 98.7 F (37.1 C)  Temp Source Oral  BP 114/73  BP Location Left Arm  BP Method Automatic  Patient Position (if appropriate) Lying  Pulse Rate 78  Pulse Rate Source Monitor  Resp 16  Pain Assessment  Pain Scale PAINAD  Pain Score 0  PAINAD (Pain Assessment in Advanced Dementia)  Breathing 0  Negative Vocalization 0  Facial Expression 0  Body Language 0  Consolability 0  PAINAD Score 0  Neurological  Neuro (WDL) X  Level of Consciousness Alert  Orientation Level Oriented to person;Disoriented to situation;Disoriented to time;Disoriented to place  Cognition Poor attention/concentration;Poor judgement;Poor safety awareness;Developmentally delayed;Unable to follow commands  Speech Clear    The patient was in a low bed with floor mats in the room. Bed alarm on. Fall risk score was 23 prior to fall. Yellow band on and yellow socks in place. The patient son was in the room with the patient during the fall. The nurse had been with the patient 20 minutes prior to the fall. The patient was in the bed resting. At 1609 the nurse tech had went to round on the patient and noticed the patient sitting up right on the floor resting on the bed side rails. The patient had managed to get out of the bed between the middle of the two rails on the rise side of the bed. The patient's son was sitting on the recliner on the right side of bed. The patient's son was a sleep and did not witness the fall. The patient is alert and oriented x1 since this morning. Vital  signs were obtained. patient assessment completed. No skin injuries have been noticed. The patient was transfer back into the bed.  Vital signs after fall T 98.7, P 78, R 16, BP 114/78, O2 100% on room air. Dr. Posey Pronto has been notified. Fall risk score 23 after fall.  The patient is currently being monitored for now.

## 2019-07-25 NOTE — Progress Notes (Signed)
[facility] Progress Note  Patient: [name] [name]    [id number]  DOA: 07/24/2019     Date of Service: the patient was seen and examined on 07/25/2019  Chief Complaint  Patient presents with  . Hypotension  . Weakness   Brief hospital course: Past medical history of diabetes, HTN, also to hypotension on midodrine, breast cancer.  Patient presented with confusion and hypotension.  Found to have recurrent UTI. Currently further plan is continue antibiotics monitor culture..  Assessment and Plan: Severe sepsis ([place])   Recurrent sepsis due to urinary tract infection ([place])   Urinary tract infection associated with indwelling urethral catheter ([place]) -Patient presented with acute confusion and unresponsiveness, low-grade fever, tachycardia, leukocytosis with endorgan effects of hypotension, elevated lactic acid 4.4 and AKI.  Urinalysis consistent with UTI -Recently hospitalized from 07/01/2019 to 07/12/2019 with sepsis secondary to UTI -Responded to IV fluid bolus in the emergency room with improvement in blood pressure and mentation Stop IV fluids -Continue IV cefepime -Follow blood and urine cultures -Foley was changed on 07/24/2019 at urology office  Chronic indwelling Foley catheter Urethral stricture -Patient has known urethral stricture followed by urology and has indwelling Foley catheter, last seen 07/24/19 -Foley catheter change in the outpatient on the morning of arrival on 07/24/2019.    AKI (acute kidney injury) ([place]) -Secondary to sepsis -Creatinine 1.44 elevated above 0.97 baseline -Should improve with IV hydration and treatment of sepsis    Acute metabolic encephalopathy, superimposed on baseline of dementia   Possible syncope related to orthostatic hypotension -Patient was found unresponsive/minimally responsive at the nursing home -Secondary to sepsis above, possible syncopal event orthostatic hypotension/autonomic dysfunction(in view of rapid rebound in  BP) -Already improving.  Continue to monitor -Fall and aspiration precautions -Head CT showed no acute disease.  Orthostatic hypotension/supine hypertension --BP 60/30 prior to arrival but BP very elevated after fluid bolus, as high as 197/91 --currently on midodrine --Evaluated by neurology in Jan 2021 for dizziness and syncope with consideration for starting florinef for autonomic hypotension --continue to monitor closely --fall precautions, compressive stockings    Diabetes mellitus (HCC) -Regular insulin sliding scale for now    History of breast cancer -Continue letrozole    Dementia without behavioral disturbance (HCC) -No behavioral disturbance  Pressure Injury 07/25/19 Buttocks Medial Stage 2 -  Partial thickness loss of dermis presenting as a shallow open injury with a red, pink wound bed without slough. (Active)  07/25/19 0100  Location: Buttocks  Location Orientation: Medial  Staging: Stage 2 -  Partial thickness loss of dermis presenting as a shallow open injury with a red, pink wound bed without slough.  Wound Description (Comments):   Present on Admission: Yes     Diet: Cardiac diet DVT Prophylaxis: Subcutaneous Heparin    Advance goals of care discussion: DNR  Family Communication: family was present at bedside, at the time of interview.  The pt provided permission to discuss medical plan with the family. Opportunity was given to ask question and all questions were answered satisfactorily.   Disposition:  Status is: Inpatient  Remains inpatient appropriate because:IV treatments appropriate due to intensity of illness or inability to take PO   Dispo: The patient is from: SNF              Anticipated d/c is to: SNF              Anticipated d/c date is: 2 days  Patient currently is not medically stable to d/c.         Subjective: Continues to have confusion.  No nausea no vomiting.  No fever no chills.  Physical Exam: General:   alert not oriented to time, place, and person.  Appear in moderate distress, affect flat in affect Eyes: PERRL ENT: Oral Mucosa Clear, moist  Neck: difficult to assess  JVD,  Cardiovascular: S1 and S2 Present, no Murmur,  Respiratory: good respiratory effort, Bilateral Air entry equal and Decreased, no Crackles, no wheezes Abdomen: Bowel Sound present, Soft and no tenderness,  Skin: no rash Extremities: no Pedal edema, no calf tenderness Neurologic: without any new focal findings Gait not checked due to patient safety concerns  Vitals:   07/25/19 1014 07/25/19 1233 07/25/19 1609 07/25/19 1646  BP: (!) 173/89 (!) 153/98 114/73 114/73  Pulse: 77 75 78 78  Resp: 14 12 16 16   Temp: (!) 97.4 F (36.3 C) 98.2 F (36.8 C) 98.7 F (37.1 C) 98.7 F (37.1 C)  TempSrc: Oral Oral Oral   SpO2: 99%  100% 100%  Weight:      Height:        Intake/Output Summary (Last 24 hours) at 07/25/2019 1929 Last data filed at 07/25/2019 1027 Gross per 24 hour  Intake 1301.38 ml  Output --  Net 1301.38 ml   Filed Weights   07/24/19 1244  Weight: 61.2 kg    Data Reviewed: I have personally reviewed and interpreted daily labs, tele strips, imagings as discussed above. I reviewed all nursing notes, pharmacy notes, vitals, pertinent old records I have discussed plan of care as described above with RN and patient/family.  CBC: Recent Labs  Lab 07/24/19 1333  WBC 11.6*  HGB 11.3*  HCT 36.2  MCV 90.0  PLT 99991111   Basic Metabolic Panel: Recent Labs  Lab 07/24/19 1425  NA 139  K 3.9  CL 106  CO2 22  GLUCOSE 120*  BUN 39*  CREATININE 1.44*  CALCIUM 8.8*    Studies: No results found.  Scheduled Meds: . calcium-vitamin D  1 tablet Oral QODAY  . Chlorhexidine Gluconate Cloth  6 each Topical Daily  . docusate sodium  100 mg Oral BID  . enoxaparin (LOVENOX) injection  30 mg Subcutaneous Q24H  . insulin aspart  0-5 Units Subcutaneous QHS  . insulin aspart  0-9 Units Subcutaneous TID WC   . letrozole  2.5 mg Oral Daily  . pantoprazole  40 mg Oral Daily  . sodium chloride flush  3 mL Intravenous Once   Continuous Infusions: . ceFEPime (MAXIPIME) IV 2 g (07/25/19 1454)   PRN Meds: acetaminophen **OR** acetaminophen, ondansetron **OR** ondansetron (ZOFRAN) IV, senna-docusate, traMADol  Time spent: 35 minutes  Author: Berle Mull, MD [facility] 07/25/2019 7:29 PM  To reach On-call, see care teams to locate the attending and reach out to them via [email or web address]. If 7PM-7AM, please contact night-coverage If you still have difficulty reaching the attending provider, please page the [facility] (Director on Call) for [facility] on amion for assistance.

## 2019-07-26 ENCOUNTER — Inpatient Hospital Stay: Payer: PPO

## 2019-07-26 LAB — BASIC METABOLIC PANEL
Anion gap: 9 (ref 5–15)
BUN: 20 mg/dL (ref 8–23)
CO2: 22 mmol/L (ref 22–32)
Calcium: 8.7 mg/dL — ABNORMAL LOW (ref 8.9–10.3)
Chloride: 110 mmol/L (ref 98–111)
Creatinine, Ser: 0.92 mg/dL (ref 0.44–1.00)
GFR calc Af Amer: >60 mL/min (ref >60)
GFR calc non Af Amer: 59 mL/min — ABNORMAL LOW (ref >60)
Glucose, Bld: 92 mg/dL (ref 70–99)
Potassium: 3.4 mmol/L — ABNORMAL LOW (ref 3.5–5.1)
Sodium: 141 mmol/L (ref 135–145)

## 2019-07-26 LAB — CBC
HCT: 30.5 % — ABNORMAL LOW (ref 36.0–46.0)
Hemoglobin: 9.6 g/dL — ABNORMAL LOW (ref 12.0–15.0)
MCH: 28.2 pg (ref 26.0–34.0)
MCHC: 31.5 g/dL (ref 30.0–36.0)
MCV: 89.7 fL (ref 80.0–100.0)
Platelets: 196 10*3/uL (ref 150–400)
RBC: 3.4 MIL/uL — ABNORMAL LOW (ref 3.87–5.11)
RDW: 17.7 % — ABNORMAL HIGH (ref 11.5–15.5)
WBC: 7.4 10*3/uL (ref 4.0–10.5)
nRBC: 0 % (ref 0.0–0.2)

## 2019-07-26 LAB — GLUCOSE, CAPILLARY
Glucose-Capillary: 64 mg/dL — ABNORMAL LOW (ref 70–99)
Glucose-Capillary: 90 mg/dL (ref 70–99)
Glucose-Capillary: 53 mg/dL — ABNORMAL LOW (ref 70–99)
Glucose-Capillary: 65 mg/dL — ABNORMAL LOW (ref 70–99)
Glucose-Capillary: 88 mg/dL (ref 70–99)
Glucose-Capillary: 105 mg/dL — ABNORMAL HIGH (ref 70–99)

## 2019-07-26 MED ORDER — ENOXAPARIN SODIUM 40 MG/0.4ML ~~LOC~~ SOLN: 40.0000 mg | SUBCUTANEOUS | Status: DC

## 2019-07-26 MED ORDER — CEPHALEXIN 500 MG PO CAPS
500.0000 mg | ORAL_CAPSULE | Freq: Three times a day (TID) | ORAL | Status: DC
  Administered 2019-07-26: 500 mg via ORAL
  Filled 2019-07-26: qty 1

## 2019-07-26 MED ORDER — ENSURE ENLIVE PO LIQD
237.0000 mL | Freq: Two times a day (BID) | ORAL | Status: DC
  Administered 2019-07-26 – 2019-08-07 (×15): 237 mL via ORAL

## 2019-07-26 MED ORDER — CEFAZOLIN SODIUM-DEXTROSE 1-4 GM/50ML-% IV SOLN
1.0000 g | Freq: Three times a day (TID) | INTRAVENOUS | Status: DC
  Administered 2019-07-26 – 2019-07-27 (×3): 1 g via INTRAVENOUS
  Filled 2019-07-26 (×6): qty 50

## 2019-07-26 MED ORDER — DEXTROSE 50 % IV SOLN
1.0000 | Freq: Once | INTRAVENOUS | Status: AC
  Administered 2019-07-26: 50 mL via INTRAVENOUS
  Filled 2019-07-26: qty 50

## 2019-07-26 MED ORDER — DEXTROSE IN LACTATED RINGERS 5 % IV SOLN: INTRAVENOUS | Status: DC

## 2019-07-26 NOTE — Evaluation (Signed)
Clinical/Bedside Swallow Evaluation Patient Details  Name: [name] [name] MRN: [id number] Date of Birth: 1941-02-23  Today's Date: 07/26/2019 Time: SLP Start Time (ACUTE ONLY): 8 SLP Stop Time (ACUTE ONLY): 1630 SLP Time Calculation (min) (ACUTE ONLY): 60 min  Past Medical History:  Past Medical History:  Diagnosis Date  . Breast cancer ([place]) 2018   Right breast  . 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 Mucin noted on biopsy, not on wide excision.  . Coronary artery disease   . GERD (gastroesophageal reflux disease)   . Hyperlipidemia   . Hypertension   . 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 Dr. Kellie Moor DX by punch bioppsy  . Lichen planus    right breast   . Lichen planus   . MI (myocardial infarction) ([place])    2015  . Personal history of radiation therapy 09/2016   RIGHT lumpectomy w/ radiation   Past Surgical History:  Past Surgical History:  Procedure Laterality Date  . ABDOMINAL HYSTERECTOMY    . APPENDECTOMY    . BREAST BIOPSY Right 07/23/2016   INVASIVE MAMMARY CARCINOMA WITH AREAS OF EXTRACELLULAR MUCIN  . BREAST EXCISIONAL BIOPSY     INVASIVE MUCINOUS MAMMARY CARCINOMA.   Marland Kitchen BREAST LUMPECTOMY Right 08/20/2016   INVASIVE MUCINOUS MAMMARY CARCINOMA. /Grade 2   . COLONOSCOPY  2016  . COLONOSCOPY N/A 09/06/2018   Procedure: COLONOSCOPY;  Surgeon: Virgel Manifold, MD;  Location: [facility] ENDOSCOPY;  Service: Endoscopy;  Laterality: N/A;  . CORONARY ANGIOPLASTY WITH STENT PLACEMENT    . CYSTOSCOPY    . CYSTOSCOPY WITH URETHRAL DILATATION N/A 06/12/2019   Procedure: CYSTOSCOPY WITH URETHRAL DILATATION;  Surgeon: Abbie Sons, MD;  Location: [facility] ORS;  Service: Urology;  Laterality: N/A;  . ESOPHAGOGASTRODUODENOSCOPY N/A 09/06/2018   Procedure: ESOPHAGOGASTRODUODENOSCOPY (EGD);  Surgeon: Virgel Manifold, MD;  Location: [facility] ENDOSCOPY;  Service: Endoscopy;  Laterality:  N/A;  . OOPHORECTOMY    . PARTIAL MASTECTOMY WITH AXILLARY SENTINEL LYMPH NODE BIOPSY Right 08/09/2016   Procedure: PARTIAL MASTECTOMY WITH AXILLARY SENTINEL LYMPH NODE BIOPSY;  Surgeon: Robert Bellow, MD;  Location: [facility] ORS;  Service: General;  Laterality: Right;  . TONSILLECTOMY     HPI:  Pt is a 79 y.o. female with medical history significant for diabetes, HTN, orthostatic hypotension on midodrine, breast cancer on letrozole, with more recent history of severe urinary retention requiring chronic Foley, recent hospitalizations for sepsis secondary to UTI w/ altered mental status/confusion. Unsure of pt's Baseline Cognitive status; noted Head CT results. CXR: "Lower lung volumes from prior exam. No acute chest finding. Large Hiatal Hernia".  NSG reported difficulty when feeding pt lunch meal today which included solid foods.    Assessment / Plan / Recommendation Clinical Impression  Pt appears to present w/ adequate oropharyngeal phase swallowing w/ reduced risk for aspiration when following general aspiration precautions w/ a modified diet. No gross oropharyngeal phase dysphagia noted w/ modified foods, thin liquids. However, pt has Baseline Esophageal phase motility deficits impacted by the "Large Hiatal Hernia" noted on CXR, chart notes. Pt also exhibits declined Cognitive awareness and follow through - often "air feeding" herself(bringing Mitted hands to mouth and taking "bites"). ANY Esophageal phase dysmotility and Cognitive decline increases risk for dysphagia, aspiration(especially aspiration of Reflux material). Pt is also at risk for adequately meeting nutrition/hydration needs.  Pt consumed po trials given w/ No overt, clinical s/s of aspiration during po intake.  During po trials, pt consumed all consistencies w/ no overt coughing, decline in vocal quality, or change in respiratory presentation during/post trials. NSG had noted a "gurgly" vocal quality as pt attempted to eat solid foods,  drink at lunch meal. This was not noted w/ po intake given w/ precautions followed. However, oral phase c/b min prolonged oral phase time w/ min munching/chewing of the Puree trials b/f swallowing -- unsure if related to Cognitive decline(?). More timely management and A-P transfer noted w/ the liquids. Adequate oral control and clearing achieved w/ all trial consistencies. OM Exam appeared [facility] w/ no unilateral weakness noted. Speech mumbled but Clear. Pt required feeding support d/t Bilateral UE Mitts in place.  Recommend a Pureed diet(dysphagia level 1) w/ thin liquids for easier, safer Esophageal clearing, oral phase management, and tp hopefully try to avoid Retrograde food/liquid activity w/ Regurgitation w/ the modified diet consistency and following strict" Reflux precautions. Per CXR, she has a "Large hiatal hernia". She is at risk for aspiration of the Regurgitation especially w/ her Cognitive decline. Recommend strict Reflux precautions to include Rest Breaks during oral intake to allow for Esophageal clearing; reduce distractions during meals. Recommend Crushing Pills and give w/ Puree. NSG to reconsult if any new needs arise. NSG/MD updated and agreed.  SLP Visit Diagnosis: Dysphagia, oral phase (R13.11);Dysphagia, pharyngoesophageal phase (R13.14)(Esophageal phase dysmotility; Hiatal Hernia)    Aspiration Risk  Mild aspiration risk;Risk for inadequate nutrition/hydration(reduced following precautions)    Diet Recommendation  Dysphagia level 1 (PUREE w/ gravies to moisten); Thin liquids. STRICT REFLUX PRECAUTIONS, general aspiration precautions; feeding support at meals - reduce distractions  Medication Administration: Crushed with puree(for easier esophageal clearing)    Other  Recommendations Recommended Consults: Consider GI evaluation;Consider esophageal assessment(Dietician f/u for support) Oral Care Recommendations: Oral care BID;Oral care before and after PO;Staff/trained caregiver to  provide oral care Other Recommendations: (n/a)   Follow up Recommendations None      Frequency and Duration (n/a)  (n/a)       Prognosis Prognosis for Safe Diet Advancement: Fair Barriers to Reach Goals: Cognitive deficits;Time post onset;Severity of deficits(Esophageal phase dysmotility; Hiatal Hernia)      Swallow Study   General Date of Onset: 07/24/19 HPI: Pt is a 79 y.o. female with medical history significant for diabetes, HTN, orthostatic hypotension on midodrine, breast cancer on letrozole, with more recent history of severe urinary retention requiring chronic Foley, recent hospitalizations for sepsis secondary to UTI w/ altered mental status/confusion. Unsure of pt's Baseline Cognitive status; noted Head CT results. CXR: "Lower lung volumes from prior exam. No acute chest finding. Large Hiatal Hernia".  NSG reported difficulty when feeding pt lunch meal today which included solid foods.  Type of Study: Bedside Swallow Evaluation Previous Swallow Assessment: none Diet Prior to this Study: Regular;Thin liquids(currently NPO since Lunch) Temperature Spikes Noted: No(wbc 7.4) Respiratory Status: Room air History of Recent Intubation: No Behavior/Cognition: Alert;Cooperative;Pleasant mood;Confused;Distractible;Requires cueing Oral Cavity Assessment: Within Functional Limits Oral Care Completed by SLP: Yes Oral Cavity - Dentition: Missing dentition(few front upper/lower dentition) Vision: Functional for self-feeding Self-Feeding Abilities: Able to feed self;Needs assist;Needs set up;Total assist Patient Positioning: Upright in bed(needed full positioning) Baseline Vocal Quality: Low vocal intensity;Normal Volitional Cough: Cognitively unable to elicit Volitional Swallow: Unable to elicit    Oral/Motor/Sensory Function Overall Oral Motor/Sensory Function: Within functional limits(appeared WFL)   Ice Chips Ice chips: Within functional limits Presentation: Spoon(fed; 2 trials)    Thin Liquid Thin Liquid: Within functional limits Presentation: Straw(supported sips;  12+) Other Comments: water, Ensure, juice    Nectar Thick Nectar Thick Liquid: Not tested   Honey Thick Honey Thick Liquid: Not tested   Puree Puree: Impaired Presentation: Spoon(fed; 8 trials ) Oral Phase Impairments: Poor awareness of bolus(min munching and chewing of puree noted) Oral Phase Functional Implications: Prolonged oral transit Pharyngeal Phase Impairments: (none)   Solid     Solid: Not tested       Orinda Kenner, MS, CCC-SLP Dashana Guizar 07/26/2019,5:06 PM

## 2019-07-26 NOTE — Consult Note (Signed)
Pharmacy Antibiotic Note  [name] [name] is a 78 y.o. female admitted on 07/24/2019 with sepsis secondary to UTI. Pharmacy has been consulted for cefazolin  Dosing.  No Urine cx sent   Last Ucx 4/25 positive for E.Coli and Klebsiella pneumoniae- sensitive to cefazolin.   Plan: Start Cefazolin 1g IV every 8 hours   Height: 5\' 5"  (165.1 cm) Weight: 61.2 kg (135 lb) IBW/kg (Calculated) : 57  Temp (24hrs), Avg:98.7 F (37.1 C), Min:98.5 F (36.9 C), Max:98.8 F (37.1 C)  Recent Labs  Lab 07/24/19 1333 07/24/19 1425 07/24/19 1430 07/26/19 0415  WBC 11.6*  --   --  7.4  CREATININE  --  1.44*  --  0.92  LATICACIDVEN 4.4*  --  2.0*  --     Estimated Creatinine Clearance: 43.9 mL/min (by C-G formula based on SCr of 0.92 mg/dL).    Allergies  Allergen Reactions  . Celebrex  [Celecoxib]     Dark stools.  . Penicillins Other (See Comments)    Childhood reaction Has patient had a PCN reaction causing immediate rash, facial/tongue/throat swelling, SOB or lightheadedness with hypotension: Unknown Has patient had a PCN reaction causing severe rash involving mucus membranes or skin necrosis: Unknown Has patient had a PCN reaction that required hospitalization: Unknown Has patient had a PCN reaction occurring within the last 10 years: Unknown If all of the above answers are "NO", then may proceed with Cephalosporin use.   Marland Kitchen Prevacid [Lansoprazole] Diarrhea    Antimicrobials this admission: 5/18 cefepime >> 5/20 5/20 Cefazolin >>  Thank you for allowing pharmacy to be a part of this patient's care.  Pernell Dupre, PharmD, BCPS Clinical Pharmacist 07/26/2019 1:40 PM

## 2019-07-26 NOTE — Evaluation (Signed)
Physical Therapy Evaluation Patient Details Name: RICHAEL COREAS MRN: [id number] DOB: 1941-02-05 Today's Date: 07/26/2019   History of Present Illness  presented to ER secondary to hypostension, decreased responsiveness; admitted for management of sepsis related to UTI.  Clinical Impression  Patient sleeping soundly upon arrival to room; awakens with mod encouragement from therapist.  Oriented to self only; follows 50-75% simple commands, often requiring hand-over-hand assist to initiate tasks on command.  Patient appears generally weak and deconditioned, strength grossly at least 3/5 throughout.  Currently requiring mod/max assist for bed mobility; min assist for sitting balance; mod/max assist +2 for sit/stand, standing balance and basic transfers.  Demonstrates very narrowed BOS, poor step height/length, tends to slide feet across floor in response to max/dep weight shift facilitated by therapist; heavy posterior lean/weight shift, absent balance reactions.  Very fearful of falling.  Unsafe to attempt without +2 at all times. Of note, vitals assessed throughout; patient with noted drop in BP with transition to upright, though appears to stabilize with accommodation to position (See vitals flowsheet for details). Would benefit from skilled PT to address above deficits and promote optimal return to PLOF.; recommend transition to STR upon discharge from acute hospitalization.     Follow Up Recommendations SNF    Equipment Recommendations       Recommendations for Other Services       Precautions / Restrictions Precautions Precautions: Fall Precaution Comments: orthostatic Restrictions Weight Bearing Restrictions: No      Mobility  Bed Mobility Overal bed mobility: Needs Assistance Bed Mobility: Supine to Sit;Sit to Supine Rolling: Mod assist;Max assist   Supine to sit: Max assist;Total assist        Transfers Overall transfer level: Needs assistance Equipment used: 2  person hand held assist Transfers: Sit to/from Stand Sit to Stand: Mod assist;Max assist;+2 physical assistance         General transfer comment: assist for lift off, standing balance; significant posterior lean/weight shift with standing position, absent righting reactions  Ambulation/Gait   Gait Distance (Feet): (4' x2) Assistive device: 2 person hand held assist       General Gait Details: narrowed BOS, poor step height/length, tends to slide feet across floor in response to max/dep weight shift facilitated by therapist; heavy posterior lean/weight shift, absent balance reactions.  Very fearful of falling  Stairs            Wheelchair Mobility    Modified Rankin (Stroke Patients Only)       Balance Overall balance assessment: Needs assistance Sitting-balance support: No upper extremity supported;Feet supported Sitting balance-Leahy Scale: Fair     Standing balance support: Bilateral upper extremity supported Standing balance-Leahy Scale: Zero                               Pertinent Vitals/Pain Pain Assessment: Faces Faces Pain Scale: No hurt    Home Living Family/patient expects to be discharged to:: Skilled nursing facility Living Arrangements: Spouse/significant other Available Help at Discharge: Family;Available 24 hours/day Type of Home: House Home Access: Level entry     Home Layout: One level Home Equipment: Walker - 2 wheels;Cane - single point;Hand held shower head Additional Comments: Patient poor historian, family not available; social history taken from previous documentation    Prior Function           Comments: Patient poor historian, family not available; social history taken from previous documentation.  Reports using SPC and  RW with mobility at baseline; does endorse 1-2 falls in recent months.  Husband assists with ADLs, household responsibilities as needed.     Hand Dominance        Extremity/Trunk Assessment    Upper Extremity Assessment Upper Extremity Assessment: Generalized weakness    Lower Extremity Assessment Lower Extremity Assessment: Generalized weakness(grossly at least 3/5 throughout)       Communication   Communication: (speech intermittent garbled and non-sensical)  Cognition Arousal/Alertness: Lethargic Behavior During Therapy: WFL for tasks assessed/performed Overall Cognitive Status: No family/caregiver present to determine baseline cognitive functioning                                 General Comments: oriented to self only; follows 50-75% simple cues, but often requires hand-over-hand to initiate tasks on-command      General Comments      Exercises Other Exercises Other Exercises: Sit/stand x4 with bilat HHA, mod/max assist +2 for lift off, standing balance and overall safety.  Worked to promote postural extension and anterior weight translation; patient generally resistant, fearful of falling Other Exercises: Toilet transfer, SPT with bilat HHA, max assist +2 for weight shift and balance; dep for hygiene/peri-care   Assessment/Plan    PT Assessment Patient needs continued PT services  PT Problem List Decreased strength;Decreased range of motion;Decreased activity tolerance;Decreased balance;Decreased mobility;Decreased coordination;Decreased knowledge of use of DME;Decreased safety awareness;Cardiopulmonary status limiting activity       PT Treatment Interventions DME instruction;Gait training;Functional mobility training;Therapeutic activities;Therapeutic exercise;Balance training;Neuromuscular re-education;Patient/family education;Stair training    PT Goals (Current goals can be found in the Care Plan section)  Acute Rehab PT Goals Patient Stated Goal: agreeable to session, "to try" PT Goal Formulation: With patient Time For Goal Achievement: 08/09/19 Potential to Achieve Goals: Fair    Frequency Min 2X/week   Barriers to discharge         Co-evaluation               AM-PAC PT "6 Clicks" Mobility  Outcome Measure Help needed turning from your back to your side while in a flat bed without using bedrails?: A Lot Help needed moving from lying on your back to sitting on the side of a flat bed without using bedrails?: Total Help needed moving to and from a bed to a chair (including a wheelchair)?: Total Help needed standing up from a chair using your arms (e.g., wheelchair or bedside chair)?: Total Help needed to walk in hospital room?: Total Help needed climbing 3-5 steps with a railing? : Total 6 Click Score: 7    End of Session Equipment Utilized During Treatment: Gait belt Activity Tolerance: Patient limited by lethargy Patient left: in bed;with call bell/phone within reach;with bed alarm set;with nursing/sitter in room Nurse Communication: Mobility status PT Visit Diagnosis: Unsteadiness on feet (R26.81);Muscle weakness (generalized) (M62.81)    Time: [id number] PT Time Calculation (min) (ACUTE ONLY): 28 min   Charges:   PT Evaluation $PT Eval Moderate Complexity: 1 Mod PT Treatments $Therapeutic Activity: 8-22 mins        Nykole Matos H. Owens Shark, PT, DPT, NCS 07/26/19, 12:36 PM [phone]

## 2019-07-26 NOTE — Progress Notes (Signed)
Hypoglycemic Event  CBG: 64  Treatment: 4 oz juice  Symptoms: none  Follow-up CBG: [id number] CBG Result:90  Possible Reasons for Event: not eating much  Comments/MD notified:    Darnelle Catalan

## 2019-07-26 NOTE — Progress Notes (Signed)
[facility] Progress Note  Patient: [name] [name]    [id number]  DOA: 07/24/2019     Date of Service: the patient was seen and examined on 07/26/2019  Chief Complaint  Patient presents with  . Hypotension  . Weakness   Brief hospital course: Past medical history of diabetes, HTN, also to hypotension on midodrine, breast cancer.  Patient presented with confusion and hypotension.  Found to have recurrent UTI. Currently further plan is continue antibiotics monitor culture..  Assessment and Plan: Severe sepsis ([place])   Recurrent sepsis due to urinary tract infection ([place])   Urinary tract infection associated with indwelling urethral catheter ([place]) -Patient presented with acute confusion and unresponsiveness, low-grade fever, tachycardia, leukocytosis with endorgan effects of hypotension, elevated lactic acid 4.4 and AKI.  Urinalysis consistent with UTI -Recently hospitalized from 07/01/2019 to 07/12/2019 with sepsis secondary to UTI -Responded to IV fluid bolus in the emergency room with improvement in blood pressure and mentation Stop IV fluids -Continue IV cefazolin -Follow blood and urine cultures -Foley was changed on 07/24/2019 at urology office  Chronic indwelling Foley catheter Urethral stricture -Patient has known urethral stricture followed by urology and has indwelling Foley catheter, last seen 07/24/19 -Foley catheter change in the outpatient on the morning of arrival on 07/24/2019.    AKI (acute kidney injury) ([place]) -Secondary to sepsis -Creatinine 1.44 elevated above 0.97 baseline -Should improve with IV hydration and treatment of sepsis    Acute metabolic encephalopathy, superimposed on baseline of dementia   Possible syncope related to orthostatic hypotension -Patient was found unresponsive/minimally responsive at the nursing home -Secondary to sepsis above, possible syncopal event orthostatic hypotension/autonomic dysfunction(in view of rapid rebound in  BP) -Already improving.  Continue to monitor -Fall and aspiration precautions -Head CT showed no acute disease. -Due to worsening poor intake after fall yesterday CT scan of the head was performed again which was negative for any acute abnormality. Speech therapy consulted recommending dysphagia diet. Continuing IV D5 LR.  Orthostatic hypotension/supine hypertension --BP 60/30 prior to arrival but BP very elevated after fluid bolus, as high as 197/91 --currently on midodrine --Evaluated by neurology in Jan 2021 for dizziness and syncope with consideration for starting florinef for autonomic hypotension --continue to monitor closely --fall precautions, compressive stockings    Diabetes mellitus (HCC) -Regular insulin sliding scale for now    History of breast cancer -Continue letrozole    Dementia without behavioral disturbance (HCC) -No behavioral disturbance  Pressure Injury 07/25/19 Buttocks Medial Stage 2 -  Partial thickness loss of dermis presenting as a shallow open injury with a red, pink wound bed without slough. (Active)  07/25/19 0100  Location: Buttocks  Location Orientation: Medial  Staging: Stage 2 -  Partial thickness loss of dermis presenting as a shallow open injury with a red, pink wound bed without slough.  Wound Description (Comments):   Present on Admission: Yes     Diet: Dysphagia diet DVT Prophylaxis: Subcutaneous Heparin    Advance goals of care discussion: DNR  Family Communication: no family was present at bedside, at the time of interview.   Disposition:  Status is: Inpatient  Remains inpatient appropriate because:IV treatments appropriate due to intensity of illness or inability to take PO   Dispo: The patient is from: SNF              Anticipated d/c is to: SNF              Anticipated d/c date is: 2 days  Patient currently is not medically stable to d/c.   Subjective: More confused and lethargic.  No nausea no vomiting.   No fever no chills.  Hypoglycemic this morning.  Physical Exam: General:  alert not oriented to time, place, and person.  Appear in mild distress, affect flat in affect Eyes: PERRL ENT: Oral Mucosa Clear, moist  Neck: difficult to assess  JVD,  Cardiovascular: S1 and S2 Present, no Murmur,  Respiratory: good respiratory effort, Bilateral Air entry equal and Decreased, no Crackles, no wheezes Abdomen: Bowel Sound present, Soft and no tenderness,  Skin: no rash Extremities: no Pedal edema, no calf tenderness Neurologic: without any new focal findings Gait not checked due to patient safety concerns  Vitals:   07/25/19 1646 07/25/19 2015 07/26/19 0438 07/26/19 1551  BP: 114/73 (!) 156/81 (!) 179/86 (!) 153/74  Pulse: 78 84 74 85  Resp: 16 16 16 16   Temp: 98.7 F (37.1 C) 98.8 F (37.1 C) 98.5 F (36.9 C) (!) 97.4 F (36.3 C)  TempSrc:  Oral Oral Oral  SpO2: 100% 100% 100%   Weight:      Height:        Intake/Output Summary (Last 24 hours) at 07/26/2019 1914 Last data filed at 07/26/2019 1600 Gross per 24 hour  Intake 0 ml  Output 850 ml  Net -850 ml   Filed Weights   07/24/19 1244  Weight: 61.2 kg    Data Reviewed: I have personally reviewed and interpreted daily labs, tele strips, imagings as discussed above. I reviewed all nursing notes, pharmacy notes, vitals, pertinent old records I have discussed plan of care as described above with RN and patient/family.  CBC: Recent Labs  Lab 07/24/19 1333 07/26/19 0415  WBC 11.6* 7.4  HGB 11.3* 9.6*  HCT 36.2 30.5*  MCV 90.0 89.7  PLT 218 [id number]   Basic Metabolic Panel: Recent Labs  Lab 07/24/19 1425 07/26/19 0415  NA 139 141  K 3.9 3.4*  CL 106 110  CO2 22 22  GLUCOSE 120* 92  BUN 39* 20  CREATININE 1.44* 0.92  CALCIUM 8.8* 8.7*    Studies: CT HEAD WO CONTRAST  Result Date: 07/26/2019 CLINICAL DATA:  Encephalopathy EXAM: CT HEAD WITHOUT CONTRAST TECHNIQUE: Contiguous axial images were obtained from the  base of the skull through the vertex without intravenous contrast. COMPARISON:  None. FINDINGS: Brain: No evidence of acute infarction, hemorrhage, hydrocephalus, extra-axial collection or mass lesion/mass effect. There is mild cerebral volume loss with associated ex vacuo dilatation. Periventricular white matter hypoattenuation likely represents chronic small vessel ischemic disease. Vascular: There are vascular calcifications in the carotid siphons. Skull: Normal. Negative for fracture or focal lesion. Sinuses/Orbits: No acute finding. Other: None. IMPRESSION: No acute intracranial process. Electronically Signed   By: Zerita Boers M.D.   On: 07/26/2019 14:13    Scheduled Meds: . Chlorhexidine Gluconate Cloth  6 each Topical Daily  . feeding supplement (ENSURE ENLIVE)  237 mL Oral BID BM   Continuous Infusions: .  ceFAZolin (ANCEF) IV 1 g (07/26/19 1505)  . dextrose 5% lactated ringers 75 mL/hr at 07/26/19 1447   PRN Meds: acetaminophen **OR** acetaminophen, ondansetron **OR** ondansetron (ZOFRAN) IV  Time spent: 35 minutes  Author: Berle Mull, MD [facility] 07/26/2019 7:14 PM  To reach On-call, see care teams to locate the attending and reach out to them via [email or web address]. If 7PM-7AM, please contact night-coverage If you still have difficulty reaching the attending provider, please page the [facility] (Director on Call) for  [facility] on amion for assistance.

## 2019-07-26 NOTE — TOC Progression Note (Addendum)
Transition of Care [facility]) - Progression Note    Patient Details  Name: NEETA MATIAS MRN: [id number] Date of Birth: 12-18-1940  [facility]) CM/SW Contact  Beverly Sessions, RN Phone Number: 07/26/2019, 12:41 PM  Clinical Narrative:    RNCM received return call from husband [name].  He also confirms he wishes for patient to return to [facility] at discharge.   RNCM called [employer] to start auth x2 .  Message left.  Awaiting return call  1345 - attempted to call [employer] to initiated auth  1405 - received return call from [place].  Spoke with rep named Marlowe Kays.  Auth initiated    Expected Discharge Plan: [place] Barriers to Discharge: Continued Medical Work up  Expected Discharge Plan and Services Expected Discharge Plan: [place] arrangements for the past 2 months: [place]                                       Social Determinants of Health (SDOH) Interventions    Readmission Risk Interventions Readmission Risk Prevention Plan 07/25/2019 07/04/2019  Transportation Screening Complete Complete  PCP or Specialist Appt within 3-5 Days - Complete  HRI or [place] - Complete  Medication Review (RN Care Manager) Complete Referral to Pharmacy  Palliative Care Screening Not Applicable -  [place] Complete -  Some recent data might be hidden

## 2019-07-26 NOTE — Progress Notes (Addendum)
   07/26/19 1307  Clinical Encounter Type  Visited With Patient  Visit Type Initial;Spiritual support  Referral From Chaplain  Consult/Referral To Chaplain  Patient told chaplain that she wasn't feeling good. Patient was trying to get under covers, therefore chaplain pulled sheet and blankets up, covering patient. Chaplain told patient that she will be praying for her. Patient told patient chaplain's availability and left.

## 2019-07-26 NOTE — Progress Notes (Signed)
PHARMACIST - PHYSICIAN COMMUNICATION  CONCERNING:  Enoxaparin (Lovenox) for DVT Prophylaxis   RECOMMENDATION: Patient was prescribed enoxaparin 30 mg q24 hours for VTE prophylaxis.   Filed Weights   07/24/19 1244  Weight: 61.2 kg (135 lb)    Body mass index is 22.47 kg/m.  Estimated Creatinine Clearance: 43.9 mL/min (by C-G formula based on SCr of 0.92 mg/dL).  Based on [place] patient is candidate for enoxaparin 40 mg every 24 hours based on CrCl > 39ml/min  DESCRIPTION: Pharmacy has adjusted enoxaparin dose per [facility] policy.  Patient is now receiving enoxaparin 40mg  every 24 hours.   Pernell Dupre, PharmD, BCPS Clinical Pharmacist 07/26/2019 8:25 AM

## 2019-07-26 NOTE — Progress Notes (Signed)
Hypoglycemic Event  CBG: 53  Treatment: 8 oz juice and ensure, patient coughing and refused to drink all  Symptoms: none  Follow-up CBG: Time:1255 CBG Result:65  Possible Reasons for Event: Not eating much  Comments/MD notified:Notifed Dr. Posey Pronto that patient is coughing and having hard time swallowing and unable to drink the juice. Got order for dextrose 50 to give patient. See other new orders as well.    Darnelle Catalan

## 2019-07-27 LAB — GLUCOSE, CAPILLARY
Glucose-Capillary: 89 mg/dL (ref 70–99)
Glucose-Capillary: 113 mg/dL — ABNORMAL HIGH (ref 70–99)
Glucose-Capillary: 110 mg/dL — ABNORMAL HIGH (ref 70–99)
Glucose-Capillary: 91 mg/dL (ref 70–99)

## 2019-07-27 LAB — CBC
HCT: 31.1 % — ABNORMAL LOW (ref 36.0–46.0)
Hemoglobin: 10.1 g/dL — ABNORMAL LOW (ref 12.0–15.0)
MCH: 28.1 pg (ref 26.0–34.0)
MCHC: 32.5 g/dL (ref 30.0–36.0)
MCV: 86.6 fL (ref 80.0–100.0)
Platelets: 238 10*3/uL (ref 150–400)
RBC: 3.59 MIL/uL — ABNORMAL LOW (ref 3.87–5.11)
RDW: 17.1 % — ABNORMAL HIGH (ref 11.5–15.5)
WBC: 8.5 10*3/uL (ref 4.0–10.5)
nRBC: 0 % (ref 0.0–0.2)

## 2019-07-27 LAB — BASIC METABOLIC PANEL
Anion gap: 8 (ref 5–15)
BUN: 14 mg/dL (ref 8–23)
CO2: 25 mmol/L (ref 22–32)
Calcium: 8.6 mg/dL — ABNORMAL LOW (ref 8.9–10.3)
Chloride: 108 mmol/L (ref 98–111)
Creatinine, Ser: 0.91 mg/dL (ref 0.44–1.00)
GFR calc Af Amer: >60 mL/min (ref >60)
GFR calc non Af Amer: 60 mL/min — ABNORMAL LOW (ref >60)
Glucose, Bld: 115 mg/dL — ABNORMAL HIGH (ref 70–99)
Potassium: 3.2 mmol/L — ABNORMAL LOW (ref 3.5–5.1)
Sodium: 141 mmol/L (ref 135–145)

## 2019-07-27 MED ORDER — CEPHALEXIN 500 MG PO CAPS
500.0000 mg | ORAL_CAPSULE | Freq: Three times a day (TID) | ORAL | Status: DC
  Administered 2019-07-27: 500 mg via ORAL
  Filled 2019-07-27: qty 1

## 2019-07-27 MED ORDER — POTASSIUM CHLORIDE 10 MEQ/100ML IV SOLN
10.0000 meq | INTRAVENOUS | Status: DC
  Administered 2019-07-27: 10 meq via INTRAVENOUS
  Filled 2019-07-27: qty 100

## 2019-07-27 MED ORDER — CEFDINIR 300 MG PO CAPS
300.0000 mg | ORAL_CAPSULE | Freq: Two times a day (BID) | ORAL | Status: DC
  Administered 2019-07-27 – 2019-07-30 (×6): 300 mg via ORAL
  Filled 2019-07-27 (×9): qty 1

## 2019-07-27 MED ORDER — POTASSIUM CHLORIDE CRYS ER 20 MEQ PO TBCR
40.0000 meq | EXTENDED_RELEASE_TABLET | Freq: Once | ORAL | Status: AC
  Administered 2019-07-27: 40 meq via ORAL
  Filled 2019-07-27: qty 2

## 2019-07-27 NOTE — Progress Notes (Signed)
SLP F/U Note  Patient Details Name: [name] [name] MRN: [id number] DOB: 12/15/1940   Cancelled treatment:       Reason Eval/Treat Not Completed: (chart reviewed; consulted NSG ) NSG reported oral intake (not much at a time) but no overt swallowing problems apparent. Pt has Baseline Esophageal issues including a "Large Hiatal Hernia" per Imaging which will impact Esophageal motility and even desire to eat d/t full feelings at times. MD is aware of the rec'd diet consistency w/ such a Large hernia presentation in hopes to aid Esophageal motility and clearing thus reducing risk for Regurgitation.  NSG to reconsult if any new issues arise. Rec. General aspiration precautions; pills in Puree.    Orinda Kenner, MS, CCC-SLP Watson,Katherine 07/27/2019, 3:30 PM

## 2019-07-27 NOTE — Care Management Important Message (Signed)
Important Message  Patient Details  Name: [name] [name] MRN: [id number] Date of Birth: April 10, 1940   Medicare Important Message Given:  Yes  Initial Medicare IM given by Patient Access Associate on 07/26/2019 at 2:01pm.     Dannette Barbara 07/27/2019, 8:10 AM

## 2019-07-27 NOTE — TOC Progression Note (Addendum)
Transition of Care [facility]) - Progression Note    Patient Details  Name: MYLEAH GOSDIN MRN: [id number] Date of Birth: 09-Aug-1940  [facility]) CM/SW Contact  Beverly Sessions, RN Phone Number: 07/27/2019, 9:18 AM  Clinical Narrative:    Received call from [place] at [facility].  auth for SNF is being denied. MD notified and can do a peer to peer with Dr Amalia Hailey [phone].    Update  Per MD he has spoken with [employer].  They will follow up with each other tomorrow for final determine if SNF will be approved.  I have updated patient's husband that if her insurance does not approve she will have to return with home health services   Expected Discharge Plan: Skilled Nursing Facility Barriers to Discharge: Continued Medical Work up  Expected Discharge Plan and Services Expected Discharge Plan: [place]       Living arrangements for the past 2 months: [place]                                       Social Determinants of Health (SDOH) Interventions    Readmission Risk Interventions Readmission Risk Prevention Plan 07/25/2019 07/04/2019  Transportation Screening Complete Complete  PCP or Specialist Appt within 3-5 Days - Complete  HRI or [place] - Complete  Medication Review (RN Care Manager) Complete Referral to Pharmacy  Palliative Care Screening Not Applicable -  Skilled Nursing Facility Complete -  Some recent data might be hidden

## 2019-07-27 NOTE — Progress Notes (Signed)
[facility] Progress Note  Patient: [name] [name]    [id number]  DOA: 07/24/2019     Date of Service: the patient was seen and examined on 07/27/2019  Chief Complaint  Patient presents with  . Hypotension  . Weakness   Brief hospital course: Past medical history of diabetes, HTN, also to hypotension on midodrine, breast cancer.  Patient presented with confusion and hypotension.  Found to have recurrent UTI. Currently further plan is continue antibiotics monitor culture.  Assessment and Plan: Severe sepsis ([place]) Recurrent sepsis due to urinary tract infection ([place]) Urinary tract infection associated with indwelling urethral catheter ([place]) -Patient presented with acute confusion and unresponsiveness, low-grade fever, tachycardia, leukocytosis with endorgan effects of hypotension, elevated lactic acid 4.4 and AKI.  Urinalysis consistent with UTI -Recently hospitalized from 07/01/2019 to 07/12/2019 with sepsis secondary to UTI -Responded to IV fluid bolus in the emergency room with improvement in blood pressure and mentation -Follow blood and urine cultures, so far no growth. Continue antibiotics. -Foley was changed on 07/24/2019 at urology office  Chronic indwelling Foley catheter Urethral stricture -Patient has known urethral stricture followed by urology and has indwelling Foley catheter, last seen 07/24/19 -Foley catheter change in the outpatient on the morning of arrival on 07/24/2019.    AKI (acute kidney injury) ([place]) -Secondary to sepsis -Creatinine 1.44 elevated above 0.97 baseline -Improved with IV hydration    Acute metabolic encephalopathy, superimposed on baseline of dementia   Possible syncope related to orthostatic hypotension -Patient was found unresponsive/minimally responsive at the nursing home -Secondary to sepsis above, possible syncopal event orthostatic hypotension/autonomic dysfunction(in view of rapid rebound in BP) -Already improving.  Continue  to monitor -Fall and aspiration precautions -Head CT showed no acute disease x2. -Remains encephalopathic and confused although improved significantly from 24 hours ago. Continue to monitor closely, delirium precaution.  Orthostatic hypotension/supine hypertension --BP 60/30 prior to arrival but BP very elevated after fluid bolus, as high as 197/91 --currently on midodrine --Evaluated by neurology in Jan 2021 for dizziness and syncope with consideration for starting florinef for autonomic hypotension --continue to monitor closely --fall precautions, compressive stockings Continues to have orthostatic hypotension.    Diabetes mellitus uncontrolled with hypoglycemia associated with no complication (HCC) -Monitor for now.  CBG.  No insulin.  Dysphagia. Large hiatal hernia. Likely secondary from progression of her dementia, her lethargy with ongoing UTI as well as combination of large hiatal hernia. Currently on dysphagia 1 diet per speech therapy. We will monitor.    History of breast cancer -Continue letrozole    Dementia without behavioral disturbance (HCC) -No behavioral disturbance  Pressure Injury 07/25/19 Buttocks Medial Stage 2 -  Partial thickness loss of dermis presenting as a shallow open injury with a red, pink wound bed without slough. (Active)  07/25/19 0100  Location: Buttocks  Location Orientation: Medial  Staging: Stage 2 -  Partial thickness loss of dermis presenting as a shallow open injury with a red, pink wound bed without slough.  Wound Description (Comments):   Present on Admission: Yes     Diet: Dysphagia diet DVT Prophylaxis: Subcutaneous Heparin    Advance goals of care discussion: DNR  Family Communication: no family was present at bedside, at the time of interview.   Disposition:  Status is: Inpatient  Remains inpatient appropriate because:IV treatments appropriate due to intensity of illness or inability to take PO   Dispo: The patient  is from: SNF  Anticipated d/c is to: SNF              Anticipated d/c date is: 2 days              Patient currently is not medically stable to d/c.   Subjective: More awake.  No nausea no vomiting.  Oral intake somewhat better than yesterday.  No fever no chills.  Physical Exam: General:  alert not oriented to time, place, and person.  Appear in mild distress, affect flat in affect Eyes: PERRL ENT: Oral Mucosa Clear, moist  Neck: difficult to assess  JVD,  Cardiovascular: S1 and S2 Present, no Murmur,  Respiratory: good respiratory effort, Bilateral Air entry equal and Decreased, no Crackles, no wheezes Abdomen: Bowel Sound present, Soft and no tenderness,  Skin: no rash Extremities: no Pedal edema, no calf tenderness Neurologic: without any new focal findings Gait not checked due to patient safety concerns  Vitals:   07/26/19 2027 07/27/19 0447 07/27/19 0525 07/27/19 1141  BP: (!) 161/83 (!) 169/102 (!) 160/85 (!) 153/83  Pulse: 81 83  85  Resp: 20 14  20   Temp: 98.6 F (37 C) 98.2 F (36.8 C)  98.2 F (36.8 C)  TempSrc: Oral Oral  Oral  SpO2: 91% 100%  100%  Weight:      Height:        Intake/Output Summary (Last 24 hours) at 07/27/2019 1728 Last data filed at 07/27/2019 0447 Gross per 24 hour  Intake 980.57 ml  Output 600 ml  Net 380.57 ml   Filed Weights   07/24/19 1244  Weight: 61.2 kg    Data Reviewed: I have personally reviewed and interpreted daily labs, tele strips, imagings as discussed above. I reviewed all nursing notes, pharmacy notes, vitals, pertinent old records I have discussed plan of care as described above with RN and patient/family.  CBC: Recent Labs  Lab 07/24/19 1333 07/26/19 0415 07/27/19 0656  WBC 11.6* 7.4 8.5  HGB 11.3* 9.6* 10.1*  HCT 36.2 30.5* 31.1*  MCV 90.0 89.7 86.6  PLT 218 196 [id number]   Basic Metabolic Panel: Recent Labs  Lab 07/24/19 1425 07/26/19 0415 07/27/19 0656  NA 139 141 141  K 3.9 3.4* 3.2*  CL  106 110 108  CO2 22 22 25   GLUCOSE 120* 92 115*  BUN 39* 20 14  CREATININE 1.44* 0.92 0.91  CALCIUM 8.8* 8.7* 8.6*    Studies: No results found.  Scheduled Meds: . cefdinir  300 mg Oral Q12H  . Chlorhexidine Gluconate Cloth  6 each Topical Daily  . feeding supplement (ENSURE ENLIVE)  237 mL Oral BID BM   Continuous Infusions:  PRN Meds: acetaminophen **OR** acetaminophen, ondansetron **OR** ondansetron (ZOFRAN) IV  Time spent: 35 minutes  Author: Berle Mull, MD [facility] 07/27/2019 5:28 PM  To reach On-call, see care teams to locate the attending and reach out to them via [email or web address]. If 7PM-7AM, please contact night-coverage If you still have difficulty reaching the attending provider, please page the [facility] (Director on Call) for [facility] on amion for assistance.

## 2019-07-28 LAB — CBC
HCT: 35.9 % — ABNORMAL LOW (ref 36.0–46.0)
Hemoglobin: 11.4 g/dL — ABNORMAL LOW (ref 12.0–15.0)
MCH: 28.4 pg (ref 26.0–34.0)
MCHC: 31.8 g/dL (ref 30.0–36.0)
MCV: 89.3 fL (ref 80.0–100.0)
Platelets: 315 10*3/uL (ref 150–400)
RBC: 4.02 MIL/uL (ref 3.87–5.11)
RDW: 17.2 % — ABNORMAL HIGH (ref 11.5–15.5)
WBC: 9.8 10*3/uL (ref 4.0–10.5)
nRBC: 0 % (ref 0.0–0.2)

## 2019-07-28 LAB — GLUCOSE, CAPILLARY
Glucose-Capillary: 60 mg/dL — ABNORMAL LOW (ref 70–99)
Glucose-Capillary: 108 mg/dL — ABNORMAL HIGH (ref 70–99)
Glucose-Capillary: 86 mg/dL (ref 70–99)
Glucose-Capillary: 79 mg/dL (ref 70–99)
Glucose-Capillary: 127 mg/dL — ABNORMAL HIGH (ref 70–99)

## 2019-07-28 LAB — BASIC METABOLIC PANEL
Anion gap: 12 (ref 5–15)
BUN: 11 mg/dL (ref 8–23)
CO2: 23 mmol/L (ref 22–32)
Calcium: 8.7 mg/dL — ABNORMAL LOW (ref 8.9–10.3)
Chloride: 102 mmol/L (ref 98–111)
Creatinine, Ser: 0.69 mg/dL (ref 0.44–1.00)
GFR calc Af Amer: >60 mL/min (ref >60)
GFR calc non Af Amer: >60 mL/min (ref >60)
Glucose, Bld: 87 mg/dL (ref 70–99)
Potassium: 3.6 mmol/L (ref 3.5–5.1)
Sodium: 137 mmol/L (ref 135–145)

## 2019-07-28 MED ORDER — B COMPLEX-C PO TABS
1.0000 | ORAL_TABLET | Freq: Every day | ORAL | Status: DC
  Administered 2019-07-29 – 2019-08-02 (×5): 1 via ORAL
  Filled 2019-07-28 (×6): qty 1

## 2019-07-28 MED ORDER — DEXTROSE IN LACTATED RINGERS 5 % IV SOLN: INTRAVENOUS | Status: DC

## 2019-07-28 MED ORDER — MEGESTROL ACETATE 400 MG/10ML PO SUSP
400.0000 mg | Freq: Two times a day (BID) | ORAL | Status: DC
  Administered 2019-07-28 – 2019-08-07 (×19): 400 mg via ORAL
  Filled 2019-07-28 (×23): qty 10

## 2019-07-28 MED ORDER — DEXTROSE 50 % IV SOLN
25.0000 mL | Freq: Once | INTRAVENOUS | Status: AC
  Administered 2019-07-28: 25 mL via INTRAVENOUS

## 2019-07-28 MED ORDER — DEXTROSE 50 % IV SOLN
INTRAVENOUS | Status: AC
  Filled 2019-07-28: qty 50

## 2019-07-28 NOTE — Plan of Care (Signed)
Continuing with plan of care. 

## 2019-07-28 NOTE — Progress Notes (Signed)
[facility] Progress Note  Patient: [name] [name]    [id number]  DOA: 07/24/2019     Date of Service: the patient was seen and examined on 07/28/2019  Chief Complaint  Patient presents with  . Hypotension  . Weakness   Brief hospital course: Past medical history of diabetes, HTN, also to hypotension on midodrine, breast cancer.  Patient presented with confusion and hypotension.  Found to have recurrent UTI. Currently further plan is continue antibiotics continue to engage with family regarding goals of care  Assessment and Plan: Severe sepsis ([place]) Recurrent sepsis due to urinary tract infection ([place]) Urinary tract infection associated with indwelling urethral catheter ([place]) -Patient presented with acute confusion and unresponsiveness, low-grade fever, tachycardia, leukocytosis with endorgan effects of hypotension, elevated lactic acid 4.4 and AKI.  Urinalysis consistent with UTI -Recently hospitalized from 07/01/2019 to 07/12/2019 with sepsis secondary to UTI -Responded to IV fluid bolus in the emergency room with improvement in blood pressure and mentation -Continue antibiotics.  So far no growth in the cultures. -Foley was changed on 07/24/2019 at urology office  Chronic indwelling Foley catheter Urethral stricture -Patient has known urethral stricture followed by urology and has indwelling Foley catheter, last seen 07/24/19 -Foley catheter change in the outpatient on the morning of arrival on 07/24/2019.    AKI (acute kidney injury) ([place]) -Secondary to sepsis -Creatinine 1.44 elevated above 0.97 baseline -Improved with IV hydration    Acute metabolic encephalopathy, superimposed on baseline of dementia   Possible syncope related to orthostatic hypotension -Patient was found unresponsive/minimally responsive at the nursing home -Secondary to sepsis above, possible syncopal event orthostatic hypotension/autonomic dysfunction(in view of rapid rebound in BP) -Vitals  are improving. -Fall and aspiration precautions, had a unwitnessed near fall in the hospital -Head CT showed no acute disease x2. -Remains encephalopathic and confused.  Minimal oral intake with persistent hypoglycemia requiring D5 and D50. Continue to monitor closely, delirium precaution.  Orthostatic hypotension/supine hypertension --BP 60/30 prior to arrival but BP very elevated after fluid bolus, as high as 197/91 --currently on midodrine --Evaluated by neurology in Jan 2021 for dizziness and syncope with consideration for starting florinef for autonomic hypotension --continue to monitor closely --fall precautions, compressive stockings Continues to have orthostatic hypotension.    Diabetes mellitus uncontrolled with hypoglycemia associated with no complication (HCC) -Monitor for now.  CBG.  No insulin.  Dysphagia. Large hiatal hernia. Likely secondary from progression of her dementia, her lethargy with ongoing UTI as well as combination of large hiatal hernia. Currently on dysphagia 1 diet per speech therapy. We will monitor.    History of breast cancer -Continue letrozole    Dementia without behavioral disturbance (HCC) -No behavioral disturbance  Goals of care discussion. Had a extensive Discussion with patient's husband at bedside and son on the phone. Offered analysis of patient's current presentation with dysphagia, lethargy, poor p.o. intake and dementia with acute delirium. Explained how  recent septic bacteremia admission and its effect on patient's ongoing prognosis and current presentation. With patient's comorbidities and poor resilience patient's prognosis remains guarded. Family currently wants to continue current care but would let us know if they decide to change goals of care to focus more on comfort.  Pressure Injury 07/25/19 Buttocks Medial Stage 2 -  Partial thickness loss of dermis presenting as a shallow open injury with a red, pink wound bed without  slough. (Active)  07/25/19 0100  Location: Buttocks  Location Orientation: Medial  Staging: Stage 2 -  Partial thickness loss  of dermis presenting as a shallow open injury with a red, pink wound bed without slough.  Wound Description (Comments):   Present on Admission: Yes     Diet: Dysphagia diet DVT Prophylaxis: Subcutaneous Heparin    Advance goals of care discussion: DNR  Family Communication: no family was present at bedside, at the time of interview.   Disposition:  Status is: Inpatient  Remains inpatient appropriate because:IV treatments appropriate due to intensity of illness or inability to take PO   Dispo: The patient is from: SNF              Anticipated d/c is to: SNF              Anticipated d/c date is: 2 days              Patient currently is not medically stable to d/c.   Subjective: Again lethargic.  Poor p.o. intake.  Hypoglycemic.  No nausea no vomiting.  Had a bowel movement yesterday.  Physical Exam: General: Lethargic, not oriented to time, place, and person.  Appear in mild distress, affect difficult to assess  ENT: Oral Mucosa Clear, moist  Neck: difficult to assess  JVD,  Cardiovascular: S1 and S2 Present, no Murmur,  Respiratory: good respiratory effort, Bilateral Air entry equal and Decreased, no Crackles, no wheezes Abdomen: Bowel Sound present, Soft and no tenderness,  Skin: no rash Extremities: no Pedal edema, no calf tenderness Neurologic: without any new focal findings Gait not checked due to patient safety concerns  Vitals:   07/27/19 1141 07/27/19 2006 07/28/19 0454 07/28/19 1205  BP: (!) 153/83 (!) 140/96 (!) 128/98 (!) 156/82  Pulse: 85 90 60 65  Resp: 20 16 20 14   Temp: 98.2 F (36.8 C) 99.4 F (37.4 C) 97.6 F (36.4 C) 98 F (36.7 C)  TempSrc: Oral Axillary Oral Oral  SpO2: 100% 99% 100% 100%  Weight:      Height:        Intake/Output Summary (Last 24 hours) at 07/28/2019 1513 Last data filed at 07/28/2019 1100 Gross per  24 hour  Intake 85.63 ml  Output 875 ml  Net -789.37 ml   Filed Weights   07/24/19 1244  Weight: 61.2 kg    Data Reviewed: I have personally reviewed and interpreted daily labs, tele strips, imagings as discussed above. I reviewed all nursing notes, pharmacy notes, vitals, pertinent old records I have discussed plan of care as described above with RN and patient/family.  CBC: Recent Labs  Lab 07/24/19 1333 07/26/19 0415 07/27/19 0656 07/28/19 0530  WBC 11.6* 7.4 8.5 9.8  HGB 11.3* 9.6* 10.1* 11.4*  HCT 36.2 30.5* 31.1* 35.9*  MCV 90.0 89.7 86.6 89.3  PLT 218 196 238 [id number]   Basic Metabolic Panel: Recent Labs  Lab 07/24/19 1425 07/26/19 0415 07/27/19 0656 07/28/19 0530  NA 139 141 141 137  K 3.9 3.4* 3.2* 3.6  CL 106 110 108 102  CO2 22 22 25 23   GLUCOSE 120* 92 115* 87  BUN 39* 20 14 11   CREATININE 1.44* 0.92 0.91 0.69  CALCIUM 8.8* 8.7* 8.6* 8.7*    Studies: No results found.  Scheduled Meds: . B-complex with vitamin C  1 tablet Oral Daily  . cefdinir  300 mg Oral Q12H  . Chlorhexidine Gluconate Cloth  6 each Topical Daily  . dextrose      . feeding supplement (ENSURE ENLIVE)  237 mL Oral BID BM  . megestrol  400 mg Oral  BID   Continuous Infusions: . dextrose 5% lactated ringers 75 mL/hr at 07/28/19 1100   PRN Meds: acetaminophen **OR** acetaminophen, ondansetron **OR** ondansetron (ZOFRAN) IV  Time spent: 35 minutes  Author: Berle Mull, MD [facility] 07/28/2019 3:13 PM  To reach On-call, see care teams to locate the attending and reach out to them via [email or web address]. If 7PM-7AM, please contact night-coverage If you still have difficulty reaching the attending provider, please page the [facility] (Director on Call) for [facility] on amion for assistance.

## 2019-07-29 ENCOUNTER — Inpatient Hospital Stay: Payer: PPO

## 2019-07-29 LAB — CBC
HCT: 31 % — ABNORMAL LOW (ref 36.0–46.0)
Hemoglobin: 10.1 g/dL — ABNORMAL LOW (ref 12.0–15.0)
MCH: 28.3 pg (ref 26.0–34.0)
MCHC: 32.6 g/dL (ref 30.0–36.0)
MCV: 86.8 fL (ref 80.0–100.0)
Platelets: 315 10*3/uL (ref 150–400)
RBC: 3.57 MIL/uL — ABNORMAL LOW (ref 3.87–5.11)
RDW: 17.2 % — ABNORMAL HIGH (ref 11.5–15.5)
WBC: 8.9 10*3/uL (ref 4.0–10.5)
nRBC: 0 % (ref 0.0–0.2)

## 2019-07-29 LAB — BASIC METABOLIC PANEL
Anion gap: 7 (ref 5–15)
BUN: 11 mg/dL (ref 8–23)
CO2: 28 mmol/L (ref 22–32)
Calcium: 8.7 mg/dL — ABNORMAL LOW (ref 8.9–10.3)
Chloride: 105 mmol/L (ref 98–111)
Creatinine, Ser: 0.66 mg/dL (ref 0.44–1.00)
GFR calc Af Amer: >60 mL/min (ref >60)
GFR calc non Af Amer: >60 mL/min (ref >60)
Glucose, Bld: 112 mg/dL — ABNORMAL HIGH (ref 70–99)
Potassium: 3.3 mmol/L — ABNORMAL LOW (ref 3.5–5.1)
Sodium: 140 mmol/L (ref 135–145)

## 2019-07-29 LAB — CULTURE, BLOOD (ROUTINE X 2)
Culture: NO GROWTH
Culture: NO GROWTH
Specimen Description: ADEQUATE
Specimen Description: ADEQUATE

## 2019-07-29 LAB — GLUCOSE, CAPILLARY
Glucose-Capillary: 64 mg/dL — ABNORMAL LOW (ref 70–99)
Glucose-Capillary: 125 mg/dL — ABNORMAL HIGH (ref 70–99)
Glucose-Capillary: 81 mg/dL (ref 70–99)
Glucose-Capillary: 88 mg/dL (ref 70–99)
Glucose-Capillary: 113 mg/dL — ABNORMAL HIGH (ref 70–99)

## 2019-07-29 MED ORDER — DEXTROSE 50 % IV SOLN
INTRAVENOUS | Status: AC
  Administered 2019-07-29: 50 mL
  Filled 2019-07-29: qty 50

## 2019-07-29 MED ORDER — ENOXAPARIN SODIUM 40 MG/0.4ML ~~LOC~~ SOLN
40.0000 mg | SUBCUTANEOUS | Status: DC
  Administered 2019-07-29 – 2019-08-02 (×5): 40 mg via SUBCUTANEOUS
  Filled 2019-07-29 (×5): qty 0.4

## 2019-07-29 MED ORDER — POTASSIUM CHLORIDE 10 MEQ/100ML IV SOLN
10.0000 meq | INTRAVENOUS | Status: AC
  Administered 2019-07-29 (×4): 10 meq via INTRAVENOUS
  Filled 2019-07-29 (×4): qty 100

## 2019-07-29 NOTE — Progress Notes (Addendum)
[facility] Progress Note  Patient: [name] [name]    [id number]  DOA: 07/24/2019     Date of Service: the patient was seen and examined on 07/29/2019  Chief Complaint  Patient presents with  . Hypotension  . Weakness   Brief hospital course: Past medical history of diabetes, HTN, also to hypotension on midodrine, breast cancer.  Patient presented with confusion and hypotension.  Found to have recurrent UTI. Currently further plan is continue antibiotics continue to engage with family regarding goals of care  Assessment and Plan: Severe sepsis ([place]) Recurrent sepsis due to urinary tract infection ([place]) Urinary tract infection associated with indwelling urethral catheter ([place]) -Patient presented with acute confusion and unresponsiveness, low-grade fever, tachycardia, leukocytosis with endorgan effects of hypotension, elevated lactic acid 4.4 and AKI.  Urinalysis consistent with UTI -Recently hospitalized from 07/01/2019 to 07/12/2019 with sepsis secondary to UTI -Responded to IV fluid bolus in the emergency room with improvement in blood pressure and mentation -Continue antibiotics.  So far no growth in the cultures. -Foley was changed on 07/24/2019 at urology office  Chronic indwelling Foley catheter Urethral stricture -Patient has known urethral stricture followed by urology and has indwelling Foley catheter, last seen 07/24/19 -Foley catheter change in the outpatient on the morning of arrival on 07/24/2019.    AKI (acute kidney injury) ([place]) -Secondary to sepsis -Creatinine 1.44 elevated above 0.97 baseline -Improved with IV hydration    Acute metabolic encephalopathy, superimposed on baseline of dementia   Possible syncope related to orthostatic hypotension -Patient was found unresponsive/minimally responsive at the nursing home -Secondary to sepsis above, possible syncopal event orthostatic hypotension/autonomic dysfunction(in view of rapid rebound in BP) -Fall and  aspiration precautions, had a unwitnessed near fall in the hospital -Head CT showed no acute disease x2. -Remains encephalopathic and confused.  Minimal oral intake with persistent hypoglycemia requiring D5 and D50. Continue to monitor closely, delirium precaution.  Orthostatic hypotension/supine hypertension --BP 60/30 prior to arrival but BP very elevated after fluid bolus, as high as 197/91 --currently on midodrine --Evaluated by neurology in Jan 2021 for dizziness and syncope with consideration for starting florinef for autonomic hypotension --continue to monitor closely --fall precautions, compressive stockings Continues to have orthostatic hypotension and supine hypertension.    Diabetes mellitus uncontrolled with hypoglycemia associated with no complication (HCC) -Monitor for now.  CBG.  No insulin.  Dysphagia. Large hiatal hernia. Likely secondary from progression of her dementia, her lethargy with ongoing UTI as well as combination of large hiatal hernia. Currently on dysphagia 1 diet per speech therapy. We will monitor.    History of breast cancer -Continue letrozole    Dementia without behavioral disturbance (HCC) -No behavioral disturbance  Goals of care discussion. Had a extensive Discussion with patient's husband at bedside and son on the phone. Offered analysis of patient's current presentation with dysphagia, lethargy, poor p.o. intake and dementia with acute delirium. Explained how  recent septic bacteremia admission and its effect on patient's ongoing prognosis and current presentation. With patient's comorbidities and poor resilience patient's prognosis remains guarded. Family currently wants to continue current care but would let us know if they decide to change goals of care to focus more on comfort.  Still remains with poor p.o. intake.  Family has not made any decisions.  Palliative care consulted for further assistance.  Patient's prognosis remains  poor.  Pressure Injury 07/25/19 Buttocks Medial Stage 2 -  Partial thickness loss of dermis presenting as a shallow open injury with a  red, pink wound bed without slough. (Active)  07/25/19 0100  Location: Buttocks  Location Orientation: Medial  Staging: Stage 2 -  Partial thickness loss of dermis presenting as a shallow open injury with a red, pink wound bed without slough.  Wound Description (Comments):   Present on Admission: Yes     Diet: Dysphagia diet DVT Prophylaxis: Subcutaneous Heparin    Advance goals of care discussion: DNR  Family Communication: no family was present at bedside, at the time of interview.   Disposition:  Status is: Inpatient  Remains inpatient appropriate because:IV treatments appropriate due to intensity of illness or inability to take PO   Dispo: The patient is from: SNF              Anticipated d/c is to: SNF              Anticipated d/c date is: 2 days              Patient currently is not medically stable to d/c.   Subjective: Again lethargic.  Poor p.o. intake.  Hypoglycemic.  No nausea no vomiting.  Had a bowel movement yesterday.  Physical Exam: General: More awake but confused, not oriented to time, place, and person. Appear in mild distress, affect difficult to assess  ENT: Oral Mucosa Clear, moist  Neck: difficult to assess  JVD,  Cardiovascular: S1 and S2 Present, no Murmur,  Respiratory: good respiratory effort, Bilateral Air entry equal and Decreased, no Crackles, no wheezes Abdomen: Bowel Sound present, Soft and no tenderness,  Skin: no rash Extremities: no Pedal edema, no calf tenderness Neurologic: without any new focal findings Gait not checked due to patient safety concerns  Vitals:   07/28/19 1205 07/28/19 2114 07/29/19 0512 07/29/19 1141  BP: (!) 156/82 (!) 120/92 121/77 117/79  Pulse: 65 72 66 69  Resp: 14 20 16 16   Temp: 98 F (36.7 C) 98.6 F (37 C) 98.6 F (37 C) 97.8 F (36.6 C)  TempSrc: Oral Oral Oral Oral   SpO2: 100% 100% 96% 96%  Weight:      Height:        Intake/Output Summary (Last 24 hours) at 07/29/2019 [id number] Last data filed at 07/29/2019 1600 Gross per 24 hour  Intake 2411.6 ml  Output 775 ml  Net 1636.6 ml   Filed Weights   07/24/19 1244  Weight: 61.2 kg    Data Reviewed: I have personally reviewed and interpreted daily labs, tele strips, imagings as discussed above. I reviewed all nursing notes, pharmacy notes, vitals, pertinent old records I have discussed plan of care as described above with RN and patient/family.  CBC: Recent Labs  Lab 07/24/19 1333 07/26/19 0415 07/27/19 0656 07/28/19 0530 07/29/19 0655  WBC 11.6* 7.4 8.5 9.8 8.9  HGB 11.3* 9.6* 10.1* 11.4* 10.1*  HCT 36.2 30.5* 31.1* 35.9* 31.0*  MCV 90.0 89.7 86.6 89.3 86.8  PLT 218 196 238 315 [id number]   Basic Metabolic Panel: Recent Labs  Lab 07/24/19 1425 07/26/19 0415 07/27/19 0656 07/28/19 0530 07/29/19 0655  NA 139 141 141 137 140  K 3.9 3.4* 3.2* 3.6 3.3*  CL 106 110 108 102 105  CO2 22 22 25 23 28   GLUCOSE 120* 92 115* 87 112*  BUN 39* 20 14 11 11   CREATININE 1.44* 0.92 0.91 0.69 0.66  CALCIUM 8.8* 8.7* 8.6* 8.7* 8.7*    Studies: DG Thoracic Spine 2 View  Result Date: 07/29/2019 CLINICAL DATA:  Back pain after fall EXAM:  THORACIC SPINE 2 VIEWS COMPARISON:  None. FINDINGS: No evidence of acute fracture or traumatic malalignment. There is a large left base opacity that likely reflects the patient's large hiatal hernia. Generalized disc narrowing in keeping with age. No focal osseous finding IMPRESSION: No evidence of acute thoracic spine injury. Electronically Signed   By: Monte Fantasia M.D.   On: 07/29/2019 13:28   DG Lumbar Spine 2-3 Views  Result Date: 07/29/2019 CLINICAL DATA:  Fall 07/25/2019.  Found on floor. EXAM: LUMBAR SPINE - 2-3 VIEW COMPARISON:  None. FINDINGS: Advanced lumbar spine degeneration with diffuse disc collapse and endplate spurring. There is also facet osteoarthritis.  Moderate lumbar levoscoliosis. No specific posttraumatic finding. Generalized osteopenia and extensive arterial calcification. IMPRESSION: 1. Advanced degenerative disease with scoliosis. 2. No acute osseous finding. Electronically Signed   By: Monte Fantasia M.D.   On: 07/29/2019 13:27   DG Sacrum/Coccyx  Result Date: 07/29/2019 CLINICAL DATA:  Found on floor. EXAM: SACRUM AND COCCYX - 2+ VIEW COMPARISON:  Renal stone CT 06/12/2019 FINDINGS: No visible sacral fracture or sacroiliac diastasis. There is limitation by osteopenia, rotation, and stool. IMPRESSION: Limited study without acute finding. Electronically Signed   By: Monte Fantasia M.D.   On: 07/29/2019 13:21    Scheduled Meds: . B-complex with vitamin C  1 tablet Oral Daily  . cefdinir  300 mg Oral Q12H  . Chlorhexidine Gluconate Cloth  6 each Topical Daily  . enoxaparin (LOVENOX) injection  40 mg Subcutaneous Q24H  . feeding supplement (ENSURE ENLIVE)  237 mL Oral BID BM  . megestrol  400 mg Oral BID   Continuous Infusions: . dextrose 5% lactated ringers 75 mL/hr at 07/29/19 1600   PRN Meds: acetaminophen **OR** acetaminophen, ondansetron **OR** ondansetron (ZOFRAN) IV  Time spent: 35 minutes  Author: Berle Mull, MD [facility] 07/29/2019 6:52 PM  To reach On-call, see care teams to locate the attending and reach out to them via [email or web address]. If 7PM-7AM, please contact night-coverage If you still have difficulty reaching the attending provider, please page the [facility] (Director on Call) for [facility] on amion for assistance.

## 2019-07-29 NOTE — Plan of Care (Signed)
Continuing with plan of care. 

## 2019-07-29 NOTE — Evaluation (Signed)
Physical Therapy Evaluation Patient Details Name: [name] [name] MRN: [id number] DOB: 05/18/1940 Today's Date: 07/29/2019   History of Present Illness  presented to ER secondary to hypostension, decreased responsiveness; admitted for management of sepsis related to UTI.  Clinical Impression  Pt was re-evaluated with a decline in function noted since first eval. Her care level for SNF has been denied by insurance, and may be better managed by Hospice for home follow up.  Pt is going to continue rehab with progression of mobility if tolerated to transfer OOB and to chair with family in attendance as available.    Follow Up Recommendations Other (comment);Supervision/Assistance - 24 hour(Hospice care)    Equipment Recommendations  None recommended by PT    Recommendations for Other Services       Precautions / Restrictions Precautions Precautions: Fall Precaution Comments: orthostatic Restrictions Weight Bearing Restrictions: No      Mobility  Bed Mobility Overal bed mobility: Needs Assistance Bed Mobility: Rolling(scooting) Rolling: Total assist(resisting PT)         General bed mobility comments: fully resisting being moved, mitted and confused  Transfers                 General transfer comment: deferred over pt resistance  Ambulation/Gait                Stairs            Wheelchair Mobility    Modified Rankin (Stroke Patients Only)       Balance                                             Pertinent Vitals/Pain Pain Assessment: Faces Faces Pain Scale: No hurt    Home Living Family/patient expects to be discharged to:: Skilled nursing facility Living Arrangements: Spouse/significant other Available Help at Discharge: Family;Available 24 hours/day Type of Home: House Home Access: Level entry     Home Layout: One level Home Equipment: Walker - 2 wheels;Cane - single point;Hand held shower head Additional  Comments: Patient poor historian, family not available; social history taken from previous documentation    Prior Function Level of Independence: Needs assistance   Gait / Transfers Assistance Needed: chart reports pt used RW  ADL's / Homemaking Assistance Needed: pt is previously reporting husband assists her care and care of house        Hand Dominance   Dominant Hand: Right    Extremity/Trunk Assessment        Lower Extremity Assessment Lower Extremity Assessment: Generalized weakness    Cervical / Trunk Assessment Cervical / Trunk Assessment: Other exceptions(pt is resisting being moved)  Communication   Communication: Expressive difficulties(confusion, minimally verbal)  Cognition Arousal/Alertness: Lethargic Behavior During Therapy: Restless Overall Cognitive Status: Difficult to assess Area of Impairment: Problem solving;Awareness;Safety/judgement;Following commands                 Orientation Level: Place;Time;Situation Current Attention Level: Selective Memory: Decreased short-term memory;Decreased recall of precautions Following Commands: Follows one step commands inconsistently;Follows one step commands with increased time Safety/Judgement: Decreased awareness of safety;Decreased awareness of deficits Awareness: Intellectual Problem Solving: Slow processing;Requires verbal cues;Requires tactile cues General Comments: requires direct assist to move      General Comments General comments (skin integrity, edema, etc.): pt is resisting PT repositioning her and trying to assist her to move up in bed,  roll and try to sit up    Exercises     Assessment/Plan    PT Assessment Patient needs continued PT services  PT Problem List Decreased strength;Decreased range of motion;Decreased activity tolerance;Decreased balance;Decreased mobility;Decreased coordination;Decreased cognition       PT Treatment Interventions DME instruction;Functional mobility  training;Therapeutic activities;Therapeutic exercise;Balance training;Neuromuscular re-education;Cognitive remediation;Patient/family education    PT Goals (Current goals can be found in the Care Plan section)  Acute Rehab PT Goals Patient Stated Goal: none agreed PT Goal Formulation: Patient unable to participate in goal setting    Frequency Min 2X/week   Barriers to discharge Decreased caregiver support requires two person help    Co-evaluation               AM-PAC PT "6 Clicks" Mobility  Outcome Measure Help needed turning from your back to your side while in a flat bed without using bedrails?: A Lot Help needed moving from lying on your back to sitting on the side of a flat bed without using bedrails?: Total Help needed moving to and from a bed to a chair (including a wheelchair)?: Total Help needed standing up from a chair using your arms (e.g., wheelchair or bedside chair)?: Total Help needed to walk in hospital room?: Total Help needed climbing 3-5 steps with a railing? : Total 6 Click Score: 7    End of Session   Activity Tolerance: Treatment limited secondary to medical complications (Comment) Patient left: in bed;with call bell/phone within reach;with bed alarm set Nurse Communication: Mobility status PT Visit Diagnosis: Muscle weakness (generalized) (M62.81);Other abnormalities of gait and mobility (R26.89)    Time: [id number] PT Time Calculation (min) (ACUTE ONLY): 10 min   Charges:   PT Evaluation $PT Eval Moderate Complexity: 1 Mod         Ramond Dial 07/29/2019, 4:47 PM  Mee Hives, PT MS Acute Rehab Dept. Number: [place] and [place]

## 2019-07-29 NOTE — TOC Progression Note (Signed)
Transition of Care [facility]) - Progression Note    Patient Details  Name: [name] [name] MRN: [id number] Date of Birth: Apr 18, 1940  Transition of Care [facility]) CM/SW Contact  Meriel Flavors, [place] Phone Number: 07/29/2019, 4:22 PM  Clinical Narrative:    Insurance has denied authorization for SNF placement @ [facility] for this patient. At this point she will likely go home with [facility] or Hospice    Expected Discharge Plan: [place] Barriers to Discharge: Continued Medical Work up  Expected Discharge Plan and Services Expected Discharge Plan: [place] arrangements for the past 2 months: [place]                                       Social Determinants of Health (SDOH) Interventions    Readmission Risk Interventions Readmission Risk Prevention Plan 07/25/2019 07/04/2019  Transportation Screening Complete Complete  PCP or Specialist Appt within 3-5 Days - Complete  HRI or [place] - Complete  Medication Review (RN Care Manager) Complete Referral to Pharmacy  Palliative Care Screening Not Applicable -  [place] Complete -  Some recent data might be hidden

## 2019-07-30 LAB — CBC
HCT: 30 % — ABNORMAL LOW (ref 36.0–46.0)
Hemoglobin: 9.8 g/dL — ABNORMAL LOW (ref 12.0–15.0)
MCH: 28.3 pg (ref 26.0–34.0)
MCHC: 32.7 g/dL (ref 30.0–36.0)
MCV: 86.7 fL (ref 80.0–100.0)
Platelets: 359 10*3/uL (ref 150–400)
RBC: 3.46 MIL/uL — ABNORMAL LOW (ref 3.87–5.11)
RDW: 17.4 % — ABNORMAL HIGH (ref 11.5–15.5)
WBC: 8.2 10*3/uL (ref 4.0–10.5)
nRBC: 0 % (ref 0.0–0.2)

## 2019-07-30 LAB — GLUCOSE, CAPILLARY
Glucose-Capillary: 75 mg/dL (ref 70–99)
Glucose-Capillary: 81 mg/dL (ref 70–99)
Glucose-Capillary: 92 mg/dL (ref 70–99)
Glucose-Capillary: 72 mg/dL (ref 70–99)
Glucose-Capillary: 96 mg/dL (ref 70–99)

## 2019-07-30 LAB — BASIC METABOLIC PANEL
Anion gap: 8 (ref 5–15)
BUN: 14 mg/dL (ref 8–23)
CO2: 26 mmol/L (ref 22–32)
Calcium: 8.5 mg/dL — ABNORMAL LOW (ref 8.9–10.3)
Chloride: 105 mmol/L (ref 98–111)
Creatinine, Ser: 0.77 mg/dL (ref 0.44–1.00)
GFR calc Af Amer: >60 mL/min (ref >60)
GFR calc non Af Amer: >60 mL/min (ref >60)
Glucose, Bld: 103 mg/dL — ABNORMAL HIGH (ref 70–99)
Potassium: 3.6 mmol/L (ref 3.5–5.1)
Sodium: 139 mmol/L (ref 135–145)

## 2019-07-30 MED ORDER — LACTULOSE 10 GM/15ML PO SOLN
20.0000 g | Freq: Every day | ORAL | Status: DC
  Administered 2019-07-31 – 2019-08-06 (×7): 20 g via ORAL
  Filled 2019-07-30 (×7): qty 30

## 2019-07-30 MED ORDER — SENNOSIDES-DOCUSATE SODIUM 8.6-50 MG PO TABS
1.0000 | ORAL_TABLET | Freq: Two times a day (BID) | ORAL | Status: DC
  Administered 2019-07-30: 1 via ORAL
  Filled 2019-07-30: qty 1

## 2019-07-30 NOTE — Care Management Important Message (Signed)
Important Message  Patient Details  Name: KATRIANNA GORKA MRN: [id number] Date of Birth: February 26, 1941   Medicare Important Message Given:  Yes     Dannette Barbara 07/30/2019, 12:16 PM

## 2019-07-30 NOTE — Progress Notes (Signed)
[facility] Progress Note  Patient: [name] [name]    [id number]  DOA: 07/24/2019     Date of Service: the patient was seen and examined on 07/30/2019  Chief Complaint  Patient presents with  . Hypotension  . Weakness   Brief hospital course: Past medical history of diabetes, HTN, also to hypotension on midodrine, breast cancer.  Patient presented with confusion and hypotension.  Found to have recurrent UTI. Currently further plan is continue antibiotics continue to engage with family regarding goals of care.  Assessment and Plan: Severe sepsis ([place]) Recurrent sepsis due to urinary tract infection ([place]) Urinary tract infection associated with indwelling urethral catheter ([place]) -Patient presented with acute confusion and unresponsiveness, low-grade fever, tachycardia, leukocytosis with endorgan effects of hypotension, elevated lactic acid 4.4 and AKI.  Urinalysis consistent with UTI -Recently hospitalized from 07/01/2019 to 07/12/2019 with sepsis secondary to UTI -Responded to IV fluid bolus in the emergency room with improvement in blood pressure and mentation -Continue antibiotics.  So far no growth in the cultures. -Foley was changed on 07/24/2019 at urology office  Chronic indwelling Foley catheter Urethral stricture -Patient has known urethral stricture followed by urology and has indwelling Foley catheter, last seen 07/24/19 -Foley catheter change in the outpatient on the morning of arrival on 07/24/2019.    AKI (acute kidney injury) ([place]) -Secondary to sepsis -Creatinine 1.44 elevated above 0.97 baseline -Improved with IV hydration    Acute metabolic encephalopathy, superimposed on baseline of dementia   Possible syncope related to orthostatic hypotension -Patient was found unresponsive/minimally responsive at the nursing home -Secondary to sepsis above, possible syncopal event orthostatic hypotension/autonomic dysfunction(in view of rapid rebound in BP) -Fall and  aspiration precautions, had a unwitnessed near fall in the hospital -Head CT showed no acute disease x2. -Remains encephalopathic and confused.  Minimal oral intake with persistent hypoglycemia requiring D5 and D50. Continue to monitor closely, delirium precaution.  Orthostatic hypotension/supine hypertension --BP 60/30 prior to arrival but BP very elevated after fluid bolus, as high as 197/91 --currently on midodrine --Evaluated by neurology in Jan 2021 for dizziness and syncope with consideration for starting florinef for autonomic hypotension --continue to monitor closely --fall precautions, compressive stockings Continues to have orthostatic hypotension and supine hypertension.    Diabetes mellitus uncontrolled with hypoglycemia associated with no complication (HCC) -Monitor for now.  CBG.  No insulin. Will stop d5 and monitor CBG  Dysphagia. Large hiatal hernia. Likely secondary from progression of her dementia, her lethargy with ongoing UTI as well as combination of large hiatal hernia. Currently on dysphagia 1 diet per speech therapy. We will monitor.    History of breast cancer -Continue letrozole    Dementia without behavioral disturbance (HCC) -No behavioral disturbance  Goals of care discussion. Had a extensive Discussion with patient's husband at bedside and son on the phone. Offered analysis of patient's current presentation with dysphagia, lethargy, poor p.o. intake and dementia with acute delirium. Explained how  recent septic bacteremia admission and its effect on patient's ongoing prognosis and current presentation. With patient's comorbidities and poor resilience patient's prognosis remains guarded. Family currently wants to continue current care but would let us know if they decide to change goals of care to focus more on comfort.  Still remains with poor p.o. intake.  Family has not made any decisions.  Palliative care consulted for further assistance.   Patient's prognosis remains poor.  Pressure Injury 07/25/19 Buttocks Medial Stage 2 -  Partial thickness loss of dermis presenting as  a shallow open injury with a red, pink wound bed without slough. (Active)  07/25/19 0100  Location: Buttocks  Location Orientation: Medial  Staging: Stage 2 -  Partial thickness loss of dermis presenting as a shallow open injury with a red, pink wound bed without slough.  Wound Description (Comments):   Present on Admission: Yes    Diet: Dysphagia diet DVT Prophylaxis: Subcutaneous Heparin    Advance goals of care discussion: DNR  Family Communication: no family was present at bedside, at the time of interview.  Voicemail full for both landline as well as mobile number for husband.  Disposition:  Status is: Inpatient  Remains inpatient appropriate because:IV treatments appropriate due to intensity of illness or inability to take PO   Dispo: The patient is from: SNF              Anticipated d/c is to: SNF              Anticipated d/c date is: 2 days              Patient currently is not medically stable to d/c.   Subjective: Remains confused and lethargic.  Unable to follow any commands.  No nausea no vomiting.  Minimal oral intake in last 24 hours.  Physical Exam: General: Remains confused, not oriented to time, place, and person. Appear in mild distress, affect difficult to assess  ENT: Oral Mucosa Clear, moist  Neck: difficult to assess  JVD,  Cardiovascular: S1 and S2 Present, no Murmur,  Respiratory: good respiratory effort, Bilateral Air entry equal and Decreased, no Crackles, no wheezes Abdomen: Bowel Sound present, Soft and no tenderness,  Skin: no rash Extremities: no Pedal edema, no calf tenderness Neurologic: without any new focal findings Gait not checked due to patient safety concerns  Vitals:   07/29/19 1141 07/29/19 2027 07/30/19 0447 07/30/19 1218  BP: 117/79 (!) 153/75 (!) 147/94 (!) 159/85  Pulse: 69 86 78 83  Resp: 16 20  20 16   Temp: 97.8 F (36.6 C) 98.4 F (36.9 C) 98.2 F (36.8 C) 98.7 F (37.1 C)  TempSrc: Oral Oral Oral Oral  SpO2: 96% 97% 99% 98%  Weight:      Height:        Intake/Output Summary (Last 24 hours) at 07/30/2019 1934 Last data filed at 07/30/2019 1900 Gross per 24 hour  Intake 360 ml  Output 1350 ml  Net -990 ml   Filed Weights   07/24/19 1244  Weight: 61.2 kg    Data Reviewed: I have personally reviewed and interpreted daily labs, tele strips, imagings as discussed above. I reviewed all nursing notes, pharmacy notes, vitals, pertinent old records I have discussed plan of care as described above with RN and patient/family.  CBC: Recent Labs  Lab 07/26/19 0415 07/27/19 0656 07/28/19 0530 07/29/19 0655 07/30/19 0428  WBC 7.4 8.5 9.8 8.9 8.2  HGB 9.6* 10.1* 11.4* 10.1* 9.8*  HCT 30.5* 31.1* 35.9* 31.0* 30.0*  MCV 89.7 86.6 89.3 86.8 86.7  PLT 196 238 315 315 [id number]   Basic Metabolic Panel: Recent Labs  Lab 07/26/19 0415 07/27/19 0656 07/28/19 0530 07/29/19 0655 07/30/19 0428  NA 141 141 137 140 139  K 3.4* 3.2* 3.6 3.3* 3.6  CL 110 108 102 105 105  CO2 22 25 23 28 26   GLUCOSE 92 115* 87 112* 103*  BUN 20 14 11 11 14   CREATININE 0.92 0.91 0.69 0.66 0.77  CALCIUM 8.7* 8.6* 8.7* 8.7* 8.5*  Studies: No results found.  Scheduled Meds: . B-complex with vitamin C  1 tablet Oral Daily  . cefdinir  300 mg Oral Q12H  . Chlorhexidine Gluconate Cloth  6 each Topical Daily  . enoxaparin (LOVENOX) injection  40 mg Subcutaneous Q24H  . feeding supplement (ENSURE ENLIVE)  237 mL Oral BID BM  . lactulose  20 g Oral Daily  . megestrol  400 mg Oral BID  . senna-docusate  1 tablet Oral BID   Continuous Infusions:  PRN Meds: acetaminophen **OR** acetaminophen, ondansetron **OR** ondansetron (ZOFRAN) IV  Time spent: 35 minutes  Author: Berle Mull, MD [facility] 07/30/2019 7:34 PM  To reach On-call, see care teams to locate the attending and reach out  to them via [email or web address]. If 7PM-7AM, please contact night-coverage If you still have difficulty reaching the attending provider, please page the [facility] (Director on Call) for [facility] on amion for assistance.

## 2019-07-31 LAB — GLUCOSE, CAPILLARY
Glucose-Capillary: 65 mg/dL — ABNORMAL LOW (ref 70–99)
Glucose-Capillary: 93 mg/dL (ref 70–99)
Glucose-Capillary: 42 mg/dL — CL (ref 70–99)
Glucose-Capillary: 89 mg/dL (ref 70–99)
Glucose-Capillary: 88 mg/dL (ref 70–99)
Glucose-Capillary: 93 mg/dL (ref 70–99)

## 2019-07-31 MED ORDER — DEXTROSE 50 % IV SOLN
INTRAVENOUS | Status: AC
  Filled 2019-07-31: qty 50

## 2019-07-31 MED ORDER — DEXTROSE 50 % IV SOLN
12.5000 g | INTRAVENOUS | Status: AC
  Administered 2019-07-31: 12.5 g via INTRAVENOUS

## 2019-07-31 MED ORDER — DEXTROSE 50 % IV SOLN
25.0000 g | INTRAVENOUS | Status: AC
  Administered 2019-07-31: 25 g via INTRAVENOUS

## 2019-07-31 NOTE — Progress Notes (Signed)
[facility] Progress Note  Patient: [name] [name]    [id number]  DOA: 07/24/2019     Date of Service: the patient was seen and examined on 07/31/2019  Chief Complaint  Patient presents with  . Hypotension  . Weakness   Brief hospital course: Past medical history of diabetes, HTN, also to hypotension on midodrine, breast cancer.  Patient presented with confusion and hypotension.  Found to have recurrent UTI. Currently further plan is continue to engage with family regarding goals of care.  Assessment and Plan: Severe sepsis ([place]) Recurrent sepsis due to urinary tract infection ([place]) Urinary tract infection associated with indwelling urethral catheter ([place]) -Patient presented with acute confusion and unresponsiveness, low-grade fever, tachycardia, leukocytosis with endorgan effects of hypotension, elevated lactic acid 4.4 and AKI.  Urinalysis consistent with UTI -Recently hospitalized from 07/01/2019 to 07/12/2019 with sepsis secondary to UTI -Responded to IV fluid bolus in the emergency room with improvement in blood pressure and mentation Treated with antibiotics.  So far no growth in the cultures. -Foley was changed on 07/24/2019 at urology office  Chronic indwelling Foley catheter Urethral stricture -Patient has known urethral stricture followed by urology and has indwelling Foley catheter, last seen 07/24/19 -Foley catheter change in the outpatient on the morning of arrival on 07/24/2019.    AKI (acute kidney injury) ([place]) resolved -Secondary to sepsis -Creatinine 1.44 elevated above 0.97 baseline -Improved with IV hydration    Acute metabolic encephalopathy, superimposed on baseline of dementia   Possible syncope related to orthostatic hypotension -Patient was found unresponsive/minimally responsive at the nursing home -Secondary to sepsis above, possible syncopal event orthostatic hypotension/autonomic dysfunction(in view of rapid rebound in BP) -Fall and  aspiration precautions, had a unwitnessed near fall in the hospital -Head CT showed no acute disease x2. -Remains encephalopathic and confused.  Minimal oral intake with persistent hypoglycemia requiring D5 and D50. Continue to monitor closely, delirium precaution.  Orthostatic hypotension/supine hypertension --BP 60/30 prior to arrival but BP very elevated after fluid bolus, as high as 197/91 --Evaluated by neurology in Jan 2021 for dizziness and syncope with consideration for starting florinef for autonomic hypotension --continue to monitor closely --fall precautions, compressive stockings Continues to have orthostatic hypotension and supine hypertension. Midodrine discontinued.    Diabetes mellitus uncontrolled with hypoglycemia associated with no complication (HCC) -Monitor for now.  Every 4 hours CBG.  No insulin. Will stop d5 and monitor CBG  Dysphagia. Large hiatal hernia. Likely secondary from progression of her dementia, her lethargy with ongoing UTI as well as combination of large hiatal hernia. Currently on dysphagia 1 diet per speech therapy. We will monitor.    History of breast cancer -Continue letrozole    Dementia without behavioral disturbance (HCC) -No behavioral disturbance  Goals of care discussion. Had a extensive Discussion with patient's husband at bedside and son on the phone. Offered analysis of patient's current presentation with dysphagia, lethargy, poor p.o. intake and dementia with acute delirium. Explained how  recent septic bacteremia admission and its effect on patient's ongoing prognosis and current presentation. With patient's comorbidities and poor resilience patient's prognosis remains guarded. Family currently wants to continue current care but would let us know if they decide to change goals of care to focus more on comfort.  Still remains with poor p.o. intake.  Family has not made any decisions.  Palliative care consulted for further  assistance.  Patient's prognosis remains poor. Unable to reach the family.  Pressure Injury 07/25/19 Buttocks Medial Stage 2 -  Partial thickness loss of dermis presenting as a shallow open injury with a red, pink wound bed without slough. (Active)  07/25/19 0100  Location: Buttocks  Location Orientation: Medial  Staging: Stage 2 -  Partial thickness loss of dermis presenting as a shallow open injury with a red, pink wound bed without slough.  Wound Description (Comments):   Present on Admission: Yes    Diet: Dysphagia diet DVT Prophylaxis: Subcutaneous Heparin    Advance goals of care discussion: DNR  Family Communication: no family was present at bedside, at the time of interview.    Disposition:  Status is: Inpatient  Remains inpatient appropriate because:IV treatments appropriate due to intensity of illness or inability to take PO   Dispo: The patient is from: SNF              Anticipated d/c is to: SNF              Anticipated d/c date is: 2 days              Patient currently is not medically stable to d/c.   Subjective: Remains confused.  No nausea no vomiting.  Minimal oral intake.  2 episodes of hypoglycemia so far today.  Physical Exam: General: Remains confused, not oriented to time, place, and person. Appear in mild distress, affect difficult to assess  ENT: Oral Mucosa Clear, moist  Neck: difficult to assess  JVD,  Cardiovascular: S1 and S2 Present, no Murmur,  Respiratory: good respiratory effort, Bilateral Air entry equal and Decreased, no Crackles, no wheezes Abdomen: Bowel Sound present, Soft and no tenderness,  Skin: no rash Extremities: no Pedal edema, no calf tenderness Neurologic: without any new focal findings Gait not checked due to patient safety concerns  Vitals:   07/30/19 1218 07/30/19 2012 07/31/19 0459 07/31/19 1359  BP: (!) 159/85 (!) 104/57 (!) 160/78 130/80  Pulse: 83 83 88 83  Resp: 16 16 20 14   Temp: 98.7 F (37.1 C) 98 F (36.7 C)  97.8 F (36.6 C) 98.3 F (36.8 C)  TempSrc: Oral Oral Oral Oral  SpO2: 98% 99% 98% 100%  Weight:      Height:        Intake/Output Summary (Last 24 hours) at 07/31/2019 2002 Last data filed at 07/31/2019 1855 Gross per 24 hour  Intake --  Output 1800 ml  Net -1800 ml   Filed Weights   07/24/19 1244  Weight: 61.2 kg    Data Reviewed: I have personally reviewed and interpreted daily labs, tele strips, imagings as discussed above. I reviewed all nursing notes, pharmacy notes, vitals, pertinent old records I have discussed plan of care as described above with RN and patient/family.  CBC: Recent Labs  Lab 07/26/19 0415 07/27/19 0656 07/28/19 0530 07/29/19 0655 07/30/19 0428  WBC 7.4 8.5 9.8 8.9 8.2  HGB 9.6* 10.1* 11.4* 10.1* 9.8*  HCT 30.5* 31.1* 35.9* 31.0* 30.0*  MCV 89.7 86.6 89.3 86.8 86.7  PLT 196 238 315 315 [id number]   Basic Metabolic Panel: Recent Labs  Lab 07/26/19 0415 07/27/19 0656 07/28/19 0530 07/29/19 0655 07/30/19 0428  NA 141 141 137 140 139  K 3.4* 3.2* 3.6 3.3* 3.6  CL 110 108 102 105 105  CO2 22 25 23 28 26   GLUCOSE 92 115* 87 112* 103*  BUN 20 14 11 11 14   CREATININE 0.92 0.91 0.69 0.66 0.77  CALCIUM 8.7* 8.6* 8.7* 8.7* 8.5*    Studies: No results found.  Scheduled Meds: .  B-complex with vitamin C  1 tablet Oral Daily  . Chlorhexidine Gluconate Cloth  6 each Topical Daily  . enoxaparin (LOVENOX) injection  40 mg Subcutaneous Q24H  . feeding supplement (ENSURE ENLIVE)  237 mL Oral BID BM  . lactulose  20 g Oral Daily  . megestrol  400 mg Oral BID   Continuous Infusions:  PRN Meds: acetaminophen **OR** acetaminophen, ondansetron **OR** ondansetron (ZOFRAN) IV  Time spent: 35 minutes  Author: Berle Mull, MD [facility] 07/31/2019 8:02 PM  To reach On-call, see care teams to locate the attending and reach out to them via [email or web address]. If 7PM-7AM, please contact night-coverage If you still have difficulty reaching the  attending provider, please page the [facility] (Director on Call) for [facility] on amion for assistance.

## 2019-07-31 NOTE — Progress Notes (Signed)
PMT consult received and chart reviewed. Discussed with Dr. Posey Pronto and RN. Patient pleasantly confused with baseline dementia. No s/s of pain or distress. No family at bedside. VM left for husband, Dejia Doxsee. Awaiting return call. PMT will f/u 5/26.  NO CHARGE  Ihor Dow, [place], FNP-C Palliative Medicine Team  Phone: [phone] Fax: [phone]

## 2019-08-01 DIAGNOSIS — Z7189 Other specified counseling: Secondary | ICD-10-CM

## 2019-08-01 DIAGNOSIS — L89002 Pressure ulcer of unspecified elbow, stage 2: Secondary | ICD-10-CM

## 2019-08-01 DIAGNOSIS — Z978 Presence of other specified devices: Secondary | ICD-10-CM

## 2019-08-01 DIAGNOSIS — N3090 Cystitis, unspecified without hematuria: Secondary | ICD-10-CM

## 2019-08-01 DIAGNOSIS — G9341 Metabolic encephalopathy: Secondary | ICD-10-CM

## 2019-08-01 DIAGNOSIS — N179 Acute kidney failure, unspecified: Secondary | ICD-10-CM

## 2019-08-01 DIAGNOSIS — Z515 Encounter for palliative care: Secondary | ICD-10-CM

## 2019-08-01 DIAGNOSIS — F0150 Vascular dementia without behavioral disturbance: Secondary | ICD-10-CM

## 2019-08-01 DIAGNOSIS — Z853 Personal history of malignant neoplasm of breast: Secondary | ICD-10-CM

## 2019-08-01 DIAGNOSIS — I2510 Atherosclerotic heart disease of native coronary artery without angina pectoris: Secondary | ICD-10-CM

## 2019-08-01 DIAGNOSIS — F0390 Unspecified dementia without behavioral disturbance: Secondary | ICD-10-CM

## 2019-08-01 LAB — BASIC METABOLIC PANEL
Anion gap: 6 (ref 5–15)
BUN: 13 mg/dL (ref 8–23)
CO2: 28 mmol/L (ref 22–32)
Calcium: 8.7 mg/dL — ABNORMAL LOW (ref 8.9–10.3)
Chloride: 104 mmol/L (ref 98–111)
Creatinine, Ser: 0.63 mg/dL (ref 0.44–1.00)
GFR calc Af Amer: >60 mL/min (ref >60)
GFR calc non Af Amer: >60 mL/min (ref >60)
Glucose, Bld: 104 mg/dL — ABNORMAL HIGH (ref 70–99)
Potassium: 3.9 mmol/L (ref 3.5–5.1)
Sodium: 138 mmol/L (ref 135–145)

## 2019-08-01 LAB — CBC
HCT: 31.2 % — ABNORMAL LOW (ref 36.0–46.0)
Hemoglobin: 9.9 g/dL — ABNORMAL LOW (ref 12.0–15.0)
MCH: 28.2 pg (ref 26.0–34.0)
MCHC: 31.7 g/dL (ref 30.0–36.0)
MCV: 88.9 fL (ref 80.0–100.0)
Platelets: 450 10*3/uL — ABNORMAL HIGH (ref 150–400)
RBC: 3.51 MIL/uL — ABNORMAL LOW (ref 3.87–5.11)
RDW: 17.3 % — ABNORMAL HIGH (ref 11.5–15.5)
WBC: 8.4 10*3/uL (ref 4.0–10.5)
nRBC: 0 % (ref 0.0–0.2)

## 2019-08-01 LAB — GLUCOSE, CAPILLARY
Glucose-Capillary: 100 mg/dL — ABNORMAL HIGH (ref 70–99)
Glucose-Capillary: 49 mg/dL — ABNORMAL LOW (ref 70–99)
Glucose-Capillary: 148 mg/dL — ABNORMAL HIGH (ref 70–99)
Glucose-Capillary: 83 mg/dL (ref 70–99)
Glucose-Capillary: 114 mg/dL — ABNORMAL HIGH (ref 70–99)

## 2019-08-01 LAB — LACTIC ACID, PLASMA
Lactic Acid, Venous: 1.1 mmol/L (ref 0.5–1.9)
Lactic Acid, Venous: 1.2 mmol/L (ref 0.5–1.9)

## 2019-08-01 MED ORDER — GLUCAGON HCL RDNA (DIAGNOSTIC) 1 MG IJ SOLR
INTRAMUSCULAR | Status: AC
  Filled 2019-08-01: qty 1

## 2019-08-01 MED ORDER — GLUCAGON HCL RDNA (DIAGNOSTIC) 1 MG IJ SOLR
1.0000 mg | INTRAMUSCULAR | Status: AC
  Administered 2019-08-01: 1 mg via INTRAMUSCULAR

## 2019-08-01 NOTE — Progress Notes (Signed)
Physical Therapy Treatment Patient Details Name: [name] [name] MRN: [id number] DOB: [birth year] Today's Date: 08/01/2019    History of Present Illness [name] [name] is an 33yoF who presents to ER secondary to hypostension, decreased responsiveness; admitted for management of sepsis related to UTI.PMH includes chronic orthostasis with several documented syncopal episodes the    PT Comments    Pt being fed by RN upon entry. Pt lethargic, required cues frequently for eyes open. Pt disoriented to situation entire session, makes ad lib tangential comments without clear context during session. Pt more alert at EOB, but still requires cues for eyes open. Pt eventually able to balance and follow simple cues for target touching with BUE, RUE accuracy is quite poor, initially tapping 2 inches inferior to target, then later completely unable to visually scan for authors hand with heavy orienting to hand, akin to severe left side neglect. Pt does not have this issue with left hand. Pt has orthostasis in session, with sustained 59mmHg SBP drop while seated, no clear signs or symptoms, but pt is too disoriented to communicate these. Pt left in bed, sitting up tall, heels floating.     Follow Up Recommendations  Other (comment);Supervision/Assistance - 24 hour;SNF;Supervision for mobility/OOB(STR v hospice, stil unclear)     Equipment Recommendations  (will defer to post DC)    Recommendations for Other Services       Precautions / Restrictions Precautions Precautions: Fall Precaution Comments: chronic orthostasis- monitor in session Restrictions Weight Bearing Restrictions: No    Mobility  Bed Mobility Overal bed mobility: Needs Assistance       Supine to sit: Total assist;+2 for physical assistance Sit to supine: Total assist;+2 for physical assistance      Transfers Overall transfer level: (not safe to attempt)                  Ambulation/Gait                  Stairs             Wheelchair Mobility    Modified Rankin (Stroke Patients Only)       Balance Overall balance assessment: Needs assistance;History of Falls Sitting-balance support: Single extremity supported;Feet supported Sitting balance-Leahy Scale: Poor Sitting balance - Comments: able to sit EOB without PT support for several minutes although acquisition is delayed.                                    Cognition Arousal/Alertness: Lethargic Behavior During Therapy: WFL for tasks assessed/performed Overall Cognitive Status: Impaired/Different from baseline                   Orientation Level: Person                    Exercises Other Exercises Other Exercises: cued UE target touching, continuous verbal cues given, alternating sides. Pt has strangely poor accuracy on RUE during task which presents most akin to a visual issue. Poor awareness of target inaccuracy.    General Comments        Pertinent Vitals/Pain Pain Assessment: No/denies pain    Home Living                      Prior Function            PT Goals (current goals can now be found in the care  plan section) Acute Rehab PT Goals PT Goal Formulation: Patient unable to participate in goal setting Progress towards PT goals: Not progressing toward goals - comment    Frequency    Min 2X/week      PT Plan Current plan remains appropriate    Co-evaluation              AM-PAC PT "6 Clicks" Mobility   Outcome Measure  Help needed turning from your back to your side while in a flat bed without using bedrails?: A Lot Help needed moving from lying on your back to sitting on the side of a flat bed without using bedrails?: Total Help needed moving to and from a bed to a chair (including a wheelchair)?: Total Help needed standing up from a chair using your arms (e.g., wheelchair or bedside chair)?: Total Help needed to walk in hospital room?:  Total Help needed climbing 3-5 steps with a railing? : Total 6 Click Score: 7    End of Session   Activity Tolerance: Patient limited by lethargy;No increased pain;Patient tolerated treatment well Patient left: in bed;with call bell/phone within reach;with bed alarm set Nurse Communication: Mobility status PT Visit Diagnosis: Muscle weakness (generalized) (M62.81);Other abnormalities of gait and mobility (R26.89)     Time: [id number] PT Time Calculation (min) (ACUTE ONLY): 22 min  Charges:  $Therapeutic Activity: 8-22 mins                     12:00 PM, 08/01/19 Etta Grandchild, PT, DPT Physical Therapist - [facility]  [phone] ([place])    [place] C 08/01/2019, 11:56 AM

## 2019-08-01 NOTE — Progress Notes (Signed)
Per Dr. Posey Pronto okay for patient to not have an IV.

## 2019-08-01 NOTE — Progress Notes (Signed)
Spoke with RN CM this AM for request to call son, [name] by 11am because he will be flying this afternoon. Attempted to call [name]. No answer, vm left. Called both numbers for husband listed on emergency contact. No answer. Discussed with RN and requested she contact this NP if husband arrives to bedside this afternoon. PMT provider will discuss [place] with family when available.   NO CHARGE  Ihor Dow, [place], FNP-C Palliative Medicine Team  Phone: [phone] Fax: [phone]

## 2019-08-01 NOTE — Consult Note (Signed)
Consultation Note Date: 08/01/2019   Patient Name: [name] [name]  DOB: 11/07/1940  MRN: [id number]  Age / Sex: 79 y.o., female  PCP: Jerrol Banana., MD Referring Physician: Allie Bossier, MD  Reason for Consultation: Establishing goals of care  HPI/Patient Profile: 79 y.o. female  with past medical history of diabetes, mild dementia, HTN, hypotension on midodrine, breast cancer admitted on 07/24/2019 with confusion and hypotension. Recurrent hospitalization x2 for sepsis secondary to UTI. 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 Foley changed 5/18 at urology office. Hospital admission for sepsis secondary to UTI, AKI, and acute encephalopathy. CT negative for acute findings. Patient completed course of antibiotics. Ongoing poor oral intake with dysphagia/large hiatal hernia on dysphagia diet. Recurrent episodes of hypoglycemia. Palliative medicine consultation for goals of care.   Clinical Assessment and Goals of Care:  I have reviewed medical records, discussed with care team including Dr. Sherral Hammers and RN. Briefly spoke with son this AM but f/u conference phone call this afternoon. Unfortunately, the patient's husband is unavailable this afternoon. Mr. 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   Son, [name] and I have arranged a phone conference with his father tomorrow 5/27 at 11:30am.   [name] and I did discuss [place] this afternoon and encouraged him to further discuss with his father this evening before our scheduled call tomorrow.   I introduced Palliative Medicine as specialized medical care for people living with serious illness. It focuses on providing relief from the symptoms and stress of a serious illness. The goal is to improve quality of life for both the patient and the family.  We discussed a brief  life review of the patient. [name] describes his parents as "a young 19's" sharing that his father still works and up until ~6 months ago, his mother was active and healthy. She started having worsening knee pain/arthritis approximately 1 year ago and worsening confusion and more sedentary in the last 6 months. [name] shares that she was not officially diagnosed with dementia until this admission.   [name] reports that she has been in the hospital 3x in the last few months.   Discussed in detail events leading up to admission and course of hospitalization including diagnoses, interventions, plan of care. Frankly and compassionately discussed poor long-term prognosis due to progression of dementia, very poor oral intake, and recurrent hypoglycemia. Insurance has denied her for further SNF rehab. Medically recommended against feeding tube placement/artificial nutritional due to irreversible nature of her dementia.     I attempted to elicit values and goals of care important to the patient and son. [name] does not believe his mother has a documented living will. He does share she would not want to be "a vegetable" and "she wouldn't want to live like this." He shares his understanding that "nobody recovers from dementia" and aggressive medical management/feeding tube would only be "prolonging."   [name] asks about hospice services following his conversation with  Dr. Posey Pronto. The difference between aggressive medical intervention and comfort care was considered. Discussed in detail hospice options and philosophy, emphasizing shift to comfort, comfort feeds, discontinuing interventions not aimed at comfort and allowing nature to take course with dementia.   [name] plans to further discuss this conversation with his father, including hospice options. Unfortunately, the patient's husband cannot be home 24/7. Patient's other son lives with them and nephew is available, but [name] is unsure if they are dependable to care for  her when Mr. Kirschbaum is working. We did discuss that she is likely eligible for hospice facility placement with very poor oral intake and recurrent hypoglycemia.   Discussed again comfort feeds, assist and encouragement with meals.   Questions and concerns were addressed. [name] appreciative of conversation. We will further discuss with his father tomorrow.     SUMMARY OF RECOMMENDATIONS    DNR. Continue current plan of care and medical management.   Medically recommended against feeding tube placement with irreversible nature of dementia.   Continue dysphagia diet. Continue assist and encouragement with meals.   Discussed in detail hospice philosophy, options, and comfort focused care including comfort feeds.   Son to further discuss with patient's husband this evening. F/u GOC with son and husband tomorrow, 5/27 at 11:30am.    Code Status/Advance Care Planning:  DNR  Symptom Management:   Per attending  Palliative Prophylaxis:   Aspiration, Delirium Protocol, Oral Care and Turn Reposition  Psycho-social/Spiritual:   Desire for further Chaplaincy support:yes  Additional Recommendations: Caregiving  Support/Resources, Compassionate Wean Education and Education on Hospice  Prognosis:   Poor long-term prognosis with progressive dementia, declining functional/cognitive/nutritional status, very poor oral intake with recurrent hypoglycemia. Recurrent UTI's.   Discharge Planning: To Be Determined      Primary Diagnoses: Present on Admission: . Urinary tract infection associated with indwelling urethral catheter ([place]) . CAD (coronary artery disease)   I have reviewed the medical record, interviewed the patient and family, and examined the patient. The following aspects are pertinent.  Past Medical History:  Diagnosis Date  . Breast cancer ([place]) 2018   Right breast  . 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 Mucin  noted on biopsy, not on wide excision.  . Coronary artery disease   . GERD (gastroesophageal reflux disease)   . Hyperlipidemia   . Hypertension   . 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 Dr. Kellie Moor DX by punch bioppsy  . Lichen planus    right breast   . Lichen planus   . MI (myocardial infarction) ([place])    2015  . Personal history of radiation therapy 09/2016   RIGHT lumpectomy w/ radiation   Social History   Socioeconomic History  . Marital status: Married    Spouse name: [name]  . Number of children: 2  . Years of education: Not on file  . Highest education level: High school graduate  Occupational History  . Occupation: retired  Tobacco Use  . Smoking status: Former Smoker    Packs/day: 0.25    Years: 10.00    Pack years: 2.50    Types: Cigarettes    Quit date: 03/06/1998    Years since quitting: 21.4  . Smokeless tobacco: Never Used  Substance and Sexual Activity  . Alcohol use: No  . Drug use: No  . Sexual activity: Yes    Birth control/protection: Surgical  Other Topics Concern  .  Not on file  Social History Narrative  . Not on file   Social Determinants of Health   Financial Resource Strain:   . Difficulty of Paying Living Expenses:   Food Insecurity:   . Worried About Charity fundraiser in the Last Year:   . Arboriculturist in the Last Year:   Transportation Needs:   . Film/video editor (Medical):   Marland Kitchen Lack of Transportation (Non-Medical):   Physical Activity:   . Days of Exercise per Week:   . Minutes of Exercise per Session:   Stress:   . Feeling of Stress :   Social Connections:   . Frequency of Communication with Friends and Family:   . Frequency of Social Gatherings with Friends and Family:   . Attends Religious Services:   . Active Member of Clubs or Organizations:   . Attends Archivist Meetings:   Marland Kitchen Marital Status:    Family History  Problem Relation Age of Onset  .  Hyperlipidemia Mother   . Allergies Mother   . Cerebral aneurysm Mother        cause of death at age 98  . Heart disease Father        Fatal MI ag 58  . Cancer Brother        lung cancer  . Hyperlipidemia Brother   . Colonic polyp Brother   . Healthy Son   . Cancer - Lung Brother        colon  . Healthy Son   . Breast cancer Neg Hx    Scheduled Meds: . B-complex with vitamin C  1 tablet Oral Daily  . Chlorhexidine Gluconate Cloth  6 each Topical Daily  . enoxaparin (LOVENOX) injection  40 mg Subcutaneous Q24H  . feeding supplement (ENSURE ENLIVE)  237 mL Oral BID BM  . lactulose  20 g Oral Daily  . megestrol  400 mg Oral BID   Continuous Infusions: PRN Meds:.acetaminophen **OR** acetaminophen, ondansetron **OR** ondansetron (ZOFRAN) IV Medications Prior to Admission:  Prior to Admission medications   Medication Sig Start Date End Date Taking? Authorizing Provider  acetaminophen (TYLENOL) 650 MG CR tablet Take 1,300 mg by mouth every 8 (eight) hours as needed for pain.   Yes [provider]  Calcium Carb-Cholecalciferol (CALCIUM 500+D) 500-200 MG-UNIT TABS Take 1 tablet by mouth every other day.    Yes [provider]  letrozole (FEMARA) 2.5 MG tablet Take 2.5 mg by mouth daily.   Yes [provider]  midodrine (PROAMATINE) 10 MG tablet Take 1 tablet (10 mg total) by mouth 3 (three) times daily with meals. 07/10/19  Yes Mercy Riding, MD  omeprazole (PRILOSEC) 20 MG capsule TAKE 1 CAPSULE (20 MG TOTAL) BY MOUTH DAILY AS NEEDED (INDIGESTION). Patient taking differently: Take 20 mg by mouth daily.  06/12/19  Yes Jerrol Banana., MD  senna-docusate (SENOKOT-S) 8.6-50 MG tablet Take 2 tablets by mouth 2 (two) times daily as needed for mild constipation. 06/28/19  Yes Sharen Hones, MD  haloperidol (HALDOL) 0.5 MG tablet Take 1 tablet (0.5 mg total) by mouth daily as needed for agitation. Patient not taking: Reported on 07/24/2019 07/12/19   Regalado, Jerald Kief A,  MD  hydrALAZINE (APRESOLINE) 25 MG tablet Take 1 tablet (25 mg total) by mouth every 12 (twelve) hours as needed (For standing SBP above 160 or standing DBP above 100). Patient not taking: Reported on 07/24/2019 07/10/19   Mercy Riding, MD  Allergies  Allergen Reactions  . Celebrex  [Celecoxib]     Dark stools.  . Penicillins Other (See Comments)    Childhood reaction Has patient had a PCN reaction causing immediate rash, facial/tongue/throat swelling, SOB or lightheadedness with hypotension: Unknown Has patient had a PCN reaction causing severe rash involving mucus membranes or skin necrosis: Unknown Has patient had a PCN reaction that required hospitalization: Unknown Has patient had a PCN reaction occurring within the last 10 years: Unknown If all of the above answers are "NO", then may proceed with Cephalosporin use.   Marland Kitchen Prevacid [Lansoprazole] Diarrhea   Review of Systems  Unable to perform ROS: Dementia   Physical Exam Vitals and nursing note reviewed.  HENT:     Head: Normocephalic and atraumatic.  Pulmonary:     Effort: No tachypnea, accessory muscle usage or respiratory distress.  Skin:    General: Skin is warm and dry.  Neurological:     Mental Status: She is easily aroused.     Comments: Oriented to name, otherwise disoriented with baseline dementia. Pleasant confusion. Follows commands.   Psychiatric:        Attention and Perception: She is inattentive.        Cognition and Memory: Cognition is impaired.    Vital Signs: BP 110/73   Pulse (!) 55   Temp (!) 97.5 F (36.4 C) (Oral)   Resp 20   Ht 5\' 5"  (1.651 m)   Wt 61.2 kg   SpO2 98%   BMI 22.47 kg/m  Pain Scale: 0-10 POSS *See Group Information*: 1-Acceptable,Awake and alert Pain Score: Asleep   SpO2: SpO2: 98 % O2 Device:SpO2: 98 % O2 Flow Rate: .   IO: Intake/output summary:   Intake/Output Summary (Last 24 hours) at 08/01/2019 1042 Last data filed at 08/01/2019 0500 Gross per 24 hour  Intake 0  ml  Output 600 ml  Net -600 ml    LBM: Last BM Date: 07/31/19 Baseline Weight: Weight: 61.2 kg Most recent weight: Weight: 61.2 kg     Palliative Assessment/Data: PPS 20%     Time In/Out: 1000-1030, 1410-1500 Time Total:35min Greater than 50%  of this time was spent counseling and coordinating care related to the above assessment and plan.  Signed by:  Ihor Dow, DNP, FNP-C Palliative Medicine Team  Phone: [phone] Fax: [phone]   Please contact Palliative Medicine Team phone at [phone] for questions and concerns.  For individual provider: See Shea Evans

## 2019-08-01 NOTE — Progress Notes (Signed)
PROGRESS NOTE    [name] [name]  [id number] DOB: [birth year] DOA: 07/24/2019 PCP: Jerrol Banana., MD   Brief Narrative:  79 year old BF PMHx DM type II, HTN, also Hypotension on midodrine, HLD, 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  Found to have recurrent UTI. Currently further plan is continue to engage with family regarding goals of care.   Subjective: A/O x3 (does not know why), patient states feels significantly improved.  Believes she was admitted secondary to her headache.  Was asking if she staying in hospital or being transported back to SNF.   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 Urinalysis consistent with UTI -Recently hospitalized from 07/01/2019 to 07/12/2019 with sepsis secondary to UTI -Responded to IV fluid bolus in the emergency room with improvement in blood pressure and mentation Treated with antibiotics.  So far no growth in the cultures. -Foley was changed on 07/24/2019 at urology office  Chronic indwelling Foley  catheter Urethral stricture -Patient has known urethral stricture followed by urology and has indwelling Foley catheter, last seen 07/24/19 -Foley catheter change in the outpatient on the morning of arrival on 07/24/2019.  AKI (acute kidney injury) ([place]) resolved -Secondary to sepsis -Creatinine 1.44 elevated above 0.97 baseline -Improved with IV hydration  Acute metabolic encephalopathy, superimposed on baseline of dementia Possible syncope related to orthostatic hypotension -Patient was found unresponsive/minimally responsive at the nursing home -Secondary to sepsis above, possiblesyncopal eventorthostatic hypotension/autonomic dysfunction(in view of rapid rebound in BP) -Fall and aspiration precautions, had a unwitnessed near fall in the hospital -Head CT showed no acute disease x2. -Remains encephalopathic and confused.  Minimal oral intake with persistent hypoglycemia requiring D5 and D50. Continue to monitor closely, delirium precaution.  Orthostatic hypotension/supine hypertension --BP 60/30 prior to arrival but BP very elevated after fluid bolus, as high as 197/91 --Evaluated by neurology in Jan 2021 for dizziness and syncope with consideration for starting florinef for autonomic hypotension --continue to monitor closely --fall precautions, compressive stockings Continues to have orthostatic hypotension and supine hypertension. Midodrine discontinued.  Diabetes mellitus uncontrolled with hypoglycemia associated with no complication (HCC) -Monitor for now.  Every 4 hours CBG.  No insulin. Will stop d5 and monitor CBG  Dysphagia. Large hiatal hernia. Likely secondary from progression of her dementia, her lethargy with ongoing UTI as well as combination of large hiatal hernia. Currently on dysphagia 1 diet per speech therapy. We will monitor.  History of breast cancer -Continue letrozole  Dementia without behavioral disturbance (HCC) -No behavioral  disturbance  Goals of care discussion. Had a extensive Discussion with patient's husband at bedside and son on the phone. Offered analysis of patient's current  presentation with dysphagia, lethargy, poor p.o. intake and dementia with acute delirium. Explained how  recent septic bacteremia admission and its effect on patient's ongoing prognosis and current presentation. With patient's comorbidities and poor resilience patient's prognosis remains guarded. Family currently wants to continue current care but would let us know if they decide to change goals of care to focus more on comfort.  Still remains with poor p.o. intake.  Family has not made any decisions.  Palliative care consulted for further assistance.  Patient's prognosis remains poor. Unable to reach the family.   Stage II decubitus ulcer Pressure Injury 07/25/19 Buttocks Medial Stage 2 -  Partial thickness loss of dermis presenting as a shallow open injury with a red, pink wound bed without slough. (Active)  07/25/19 0100  Location: Buttocks  Location Orientation: Medial  Staging: Stage 2 -  Partial thickness loss of dermis presenting as a shallow open injury with a red, pink wound bed without slough.  Wound Description (Comments):   Present on Admission: Yes        DVT prophylaxis:  Code Status: DNR Family Communication:  Disposition Plan:  Status is: Inpatient    Dispo: The patient is from: Home              Anticipated d/c is to: Hospice              Anticipated d/c date is: Unknown              Patient currently unstable      Consultants:  Palliative care   Procedures/Significant Events:     I have personally reviewed and interpreted all radiology studies and my findings are as above.  VENTILATOR SETTINGS:    Cultures   Antimicrobials:    Devices    LINES / TUBES:      Continuous Infusions:   Objective: Vitals:   07/31/19 0459 07/31/19 1359 07/31/19 2117 08/01/19 0456  BP:  (!) 160/78 130/80 100/69 110/73  Pulse: 88 83 96 (!) 55  Resp: 20 14 14 20   Temp: 97.8 F (36.6 C) 98.3 F (36.8 C) (!) 97.4 F (36.3 C) (!) 97.5 F (36.4 C)  TempSrc: Oral Oral Axillary Oral  SpO2: 98% 100% 100% 98%  Weight:      Height:        Intake/Output Summary (Last 24 hours) at 08/01/2019 [id number] Last data filed at 08/01/2019 0500 Gross per 24 hour  Intake 0 ml  Output 600 ml  Net -600 ml   Filed Weights   07/24/19 1244  Weight: 61.2 kg    Examination:  General: A/O x3 (does not know why) no acute respiratory distress Eyes: negative scleral hemorrhage, negative anisocoria, negative icterus ENT: Negative Runny nose, negative gingival bleeding, Neck:  Negative scars, masses, torticollis, lymphadenopathy, JVD Lungs: Clear to auscultation bilaterally without wheezes or crackles Cardiovascular: Regular rate and rhythm without murmur gallop or rub normal S1 and S2 Abdomen: negative abdominal pain, nondistended, positive soft, bowel sounds, no rebound, no ascites, no appreciable mass Extremities: No significant cyanosis, clubbing, or edema bilateral lower extremities Skin: Negative rashes, lesions, ulcers Psychiatric:  Negative depression, negative anxiety, negative fatigue, negative mania  Central nervous system:  Cranial nerves II through XII intact, tongue/uvula midline, all extremities muscle strength 5/5, sensation intact throughout,  negative dysarthria, negative expressive aphasia, negative receptive aphasia.  .     Data Reviewed: Care during the described time interval was provided by me .  I have reviewed this patient's available  data, including medical history, events of note, physical examination, and all test results as part of my evaluation.   CBC: Recent Labs  Lab 07/27/19 0656 07/28/19 0530 07/29/19 0655 07/30/19 0428 08/01/19 0521  WBC 8.5 9.8 8.9 8.2 8.4  HGB 10.1* 11.4* 10.1* 9.8* 9.9*  HCT 31.1* 35.9* 31.0* 30.0* 31.2*  MCV 86.6 89.3 86.8 86.7  88.9  PLT 238 315 315 359 [id number]*   Basic Metabolic Panel: Recent Labs  Lab 07/27/19 0656 07/28/19 0530 07/29/19 0655 07/30/19 0428 08/01/19 0521  NA 141 137 140 139 138  K 3.2* 3.6 3.3* 3.6 3.9  CL 108 102 105 105 104  CO2 25 23 28 26 28   GLUCOSE 115* 87 112* 103* 104*  BUN 14 11 11 14 13   CREATININE 0.91 0.69 0.66 0.77 0.63  CALCIUM 8.6* 8.7* 8.7* 8.5* 8.7*   GFR: Estimated Creatinine Clearance: 50.5 mL/min (by C-G formula based on SCr of 0.63 mg/dL). Liver Function Tests: No results for input(s): AST, ALT, ALKPHOS, BILITOT, PROT, ALBUMIN in the last 168 hours. No results for input(s): LIPASE, AMYLASE in the last 168 hours. No results for input(s): AMMONIA in the last 168 hours. Coagulation Profile: No results for input(s): INR, PROTIME in the last 168 hours. Cardiac Enzymes: No results for input(s): CKTOTAL, CKMB, CKMBINDEX, TROPONINI in the last 168 hours. BNP (last 3 results) No results for input(s): PROBNP in the last 8760 hours. HbA1C: No results for input(s): HGBA1C in the last 72 hours. CBG: Recent Labs  Lab 07/31/19 1214 07/31/19 1631 07/31/19 2108 08/01/19 0746 08/01/19 0821  GLUCAP 89 88 93 49* 100*   Lipid Profile: No results for input(s): CHOL, HDL, LDLCALC, TRIG, CHOLHDL, LDLDIRECT in the last 72 hours. Thyroid Function Tests: No results for input(s): TSH, T4TOTAL, FREET4, T3FREE, THYROIDAB in the last 72 hours. Anemia Panel: No results for input(s): VITAMINB12, FOLATE, FERRITIN, TIBC, IRON, RETICCTPCT in the last 72 hours. Urine analysis:    Component Value Date/Time   COLORURINE YELLOW (A) 07/24/2019 1856   APPEARANCEUR CLOUDY (A) 07/24/2019 1856   APPEARANCEUR Cloudy (A) 04/25/2015 1449   LABSPEC 1.021 07/24/2019 1856   LABSPEC 1.021 01/17/2014 1524   PHURINE 5.0 07/24/2019 1856   GLUCOSEU NEGATIVE 07/24/2019 1856   GLUCOSEU Negative 01/17/2014 1524   HGBUR MODERATE (A) 07/24/2019 1856   BILIRUBINUR NEGATIVE 07/24/2019 1856   BILIRUBINUR  neg 06/30/2016 1619   BILIRUBINUR Negative 04/25/2015 1449   BILIRUBINUR Negative 01/17/2014 1524   KETONESUR 5 (A) 07/24/2019 1856   PROTEINUR 100 (A) 07/24/2019 1856   UROBILINOGEN 0.2 06/30/2016 1619   NITRITE NEGATIVE 07/24/2019 1856   LEUKOCYTESUR LARGE (A) 07/24/2019 1856   LEUKOCYTESUR 2+ 01/17/2014 1524   Sepsis Labs: @LABRCNTIP (procalcitonin:4,lacticidven:4)  ) Recent Results (from the past 240 hour(s))  Blood culture (routine x 2)     Status: None   Collection Time: 07/24/19  2:21 PM   Specimen: BLOOD  Result Value Ref Range Status   Specimen Description BLOOD Blood Culture adequate volume  Final   Special Requests   Final    BOTTLES DRAWN AEROBIC AND ANAEROBIC LEFT ANTECUBITAL   Culture   Final    NO GROWTH 5 DAYS Performed at [facility], [address], [place], [place] [address]    Report Status 07/29/2019 FINAL  Final  Blood culture (routine x 2)     Status: None   Collection Time: 07/24/19  2:26 PM   Specimen: BLOOD  Result Value Ref Range Status   Specimen Description  BLOOD Blood Culture adequate volume  Final   Special Requests   Final    BOTTLES DRAWN AEROBIC AND ANAEROBIC RIGHT ANTECUBITAL   Culture   Final    NO GROWTH 5 DAYS Performed at [facility], [address] [place], [place] [address]    Report Status 07/29/2019 FINAL  Final  SARS Coronavirus 2 by RT PCR (hospital order, performed in [facility] hospital lab) Nasopharyngeal Nasopharyngeal Swab     Status: None   Collection Time: 07/24/19  7:47 PM   Specimen: Nasopharyngeal Swab  Result Value Ref Range Status   SARS Coronavirus 2 NEGATIVE NEGATIVE Final    Comment: (NOTE) SARS-CoV-2 target nucleic acids are NOT DETECTED. The SARS-CoV-2 RNA is generally detectable in upper and lower respiratory specimens during the acute phase of infection. The lowest concentration of SARS-CoV-2 viral copies this assay can detect is 250 copies / mL. A negative result does not preclude  SARS-CoV-2 infection and should not be used as the sole basis for treatment or other patient management decisions.  A negative result may occur with improper specimen collection / handling, submission of specimen other than nasopharyngeal swab, presence of viral mutation(s) within the areas targeted by this assay, and inadequate number of viral copies (<250 copies / mL). A negative result must be combined with clinical observations, patient history, and epidemiological information. Fact Sheet for Patients:   StrictlyIdeas.no Fact Sheet for Healthcare Providers: BankingDealers.co.za This test is not yet approved or cleared  by the [place] FDA and has been authorized for detection and/or diagnosis of SARS-CoV-2 by FDA under an Emergency Use Authorization (EUA).  This EUA will remain in effect (meaning this test can be used) for the duration of the COVID-19 declaration under Section 564(b)(1) of the Act, 21 U.S.C. section 360bbb-3(b)(1), unless the authorization is terminated or revoked sooner. Performed at [facility], [address] [place], [place] [address]   MRSA PCR Screening     Status: None   Collection Time: 07/25/19 12:44 AM   Specimen: Nasopharyngeal  Result Value Ref Range Status   MRSA by PCR NEGATIVE NEGATIVE Final    Comment:        The GeneXpert MRSA Assay (FDA approved for NASAL specimens only), is one component of a comprehensive MRSA colonization surveillance program. It is not intended to diagnose MRSA infection nor to guide or monitor treatment for MRSA infections. Performed at [facility], [address] [place], [place] [address]          Radiology Studies: No results found.      Scheduled Meds: . B-complex with vitamin C  1 tablet Oral Daily  . Chlorhexidine Gluconate Cloth  6 each Topical Daily  . enoxaparin (LOVENOX) injection  40 mg Subcutaneous Q24H  . feeding  supplement (ENSURE ENLIVE)  237 mL Oral BID BM  . lactulose  20 g Oral Daily  . megestrol  400 mg Oral BID   Continuous Infusions:   LOS: 8 days   The patient is critically ill with multiple organ systems failure and requires high complexity decision making for assessment and support, frequent evaluation and titration of therapies, application of advanced monitoring technologies and extensive interpretation of multiple databases. Critical Care Time devoted to patient care services described in this note  Time spent: 40 minutes     Wrenly Lauritsen, Geraldo Docker, MD [facility] Pager [phone]  If 7PM-7AM, please contact night-coverage [email or web address] Password [email or web address] 08/01/2019, 8:29 AM

## 2019-08-02 ENCOUNTER — Other Ambulatory Visit: Payer: Self-pay | Admitting: *Deleted

## 2019-08-02 DIAGNOSIS — R1310 Dysphagia, unspecified: Secondary | ICD-10-CM

## 2019-08-02 DIAGNOSIS — E162 Hypoglycemia, unspecified: Secondary | ICD-10-CM

## 2019-08-02 LAB — COMPREHENSIVE METABOLIC PANEL
ALT: 12 U/L (ref 0–44)
AST: 19 U/L (ref 15–41)
Albumin: 2.5 g/dL — ABNORMAL LOW (ref 3.5–5.0)
Alkaline Phosphatase: 43 U/L (ref 38–126)
Anion gap: 6 (ref 5–15)
BUN: 15 mg/dL (ref 8–23)
CO2: 27 mmol/L (ref 22–32)
Calcium: 8.5 mg/dL — ABNORMAL LOW (ref 8.9–10.3)
Chloride: 107 mmol/L (ref 98–111)
Creatinine, Ser: 0.79 mg/dL (ref 0.44–1.00)
GFR calc Af Amer: >60 mL/min (ref >60)
GFR calc non Af Amer: >60 mL/min (ref >60)
Glucose, Bld: 103 mg/dL — ABNORMAL HIGH (ref 70–99)
Potassium: 3.7 mmol/L (ref 3.5–5.1)
Sodium: 140 mmol/L (ref 135–145)
Total Bilirubin: 0.6 mg/dL (ref 0.3–1.2)
Total Protein: 5.7 g/dL — ABNORMAL LOW (ref 6.5–8.1)

## 2019-08-02 LAB — CBC
HCT: 30.1 % — ABNORMAL LOW (ref 36.0–46.0)
Hemoglobin: 9.8 g/dL — ABNORMAL LOW (ref 12.0–15.0)
MCH: 28.3 pg (ref 26.0–34.0)
MCHC: 32.6 g/dL (ref 30.0–36.0)
MCV: 87 fL (ref 80.0–100.0)
Platelets: 460 10*3/uL — ABNORMAL HIGH (ref 150–400)
RBC: 3.46 MIL/uL — ABNORMAL LOW (ref 3.87–5.11)
RDW: 17.5 % — ABNORMAL HIGH (ref 11.5–15.5)
WBC: 7.3 10*3/uL (ref 4.0–10.5)
nRBC: 0 % (ref 0.0–0.2)

## 2019-08-02 LAB — GLUCOSE, CAPILLARY
Glucose-Capillary: 81 mg/dL (ref 70–99)
Glucose-Capillary: 83 mg/dL (ref 70–99)
Glucose-Capillary: 61 mg/dL — ABNORMAL LOW (ref 70–99)
Glucose-Capillary: 136 mg/dL — ABNORMAL HIGH (ref 70–99)
Glucose-Capillary: 84 mg/dL (ref 70–99)

## 2019-08-02 LAB — MAGNESIUM: Magnesium: 1.7 mg/dL (ref 1.7–2.4)

## 2019-08-02 LAB — PHOSPHORUS: Phosphorus: 3.3 mg/dL (ref 2.5–4.6)

## 2019-08-02 MED ORDER — GLUCAGON HCL RDNA (DIAGNOSTIC) 1 MG IJ SOLR: 1.0000 mg | INTRAMUSCULAR | Status: AC

## 2019-08-02 MED ORDER — GLUCOSE 40 % PO GEL: 1.0000 | Freq: Three times a day (TID) | ORAL | Status: DC | PRN

## 2019-08-02 MED ORDER — CHLORHEXIDINE GLUCONATE CLOTH 2 % EX PADS
6.0000 | MEDICATED_PAD | Freq: Every day | CUTANEOUS | Status: DC
  Administered 2019-08-03 – 2019-08-07 (×5): 6 via TOPICAL

## 2019-08-02 MED ORDER — GLUCAGON HCL RDNA (DIAGNOSTIC) 1 MG IJ SOLR
INTRAMUSCULAR | Status: AC
  Filled 2019-08-02: qty 1

## 2019-08-02 MED ORDER — LETROZOLE 2.5 MG PO TABS: 2.5000 mg | ORAL_TABLET | Freq: Every day | ORAL | 0 refills | Status: DC

## 2019-08-02 MED ORDER — DEXTROSE-NACL 5-0.45 % IV SOLN: INTRAVENOUS | Status: DC

## 2019-08-02 MED ORDER — MORPHINE SULFATE (CONCENTRATE) 10 MG/0.5ML PO SOLN: 2.5000 mg | ORAL | Status: DC | PRN

## 2019-08-02 NOTE — Progress Notes (Addendum)
[place] [facility]) Hospital Liaison RN note  Received request from [place], RN for family interest in [place]. Chart reviewed. Attempted to call husband to acknowledge referral, no answer. Left message for him to return my call.  Unfortunately, [facility] is not able to offer a room today. Family and TOC manager are aware [facility] liaison will follow up tomorrow or sooner if room becomes available.  Please call with any hospice related questions or concerns.   Thank you, Margaretmary Eddy, BSN, RN [facility] Liaison [phone]

## 2019-08-02 NOTE — Progress Notes (Signed)
Palliative Medicine Team                                                                                                                                                       Daily Progress Note   Patient Name: [name] [name]       Date: 08/02/2019 DOB: June 15, 1940  Age: 79 y.o. MRN#: [id number] Attending Physician: Allie Bossier, MD Primary Care Physician: Jerrol Banana., MD Admit Date: 07/24/2019  Reason for Consultation/Follow-up: Establishing goals of care and Terminal Care  Subjective: Patient wakes to voice. Oriented to name otherwise disoriented with baseline dementia. Denies pain and does not appear to be in pain or distress. Not accepting bites or sips for RN/tech. Will follow commands.   GOC:   F/u GOC discussion with patient's husband [name]) and son [name]) via telephone.   Discussed in detail events leading up to admission, course of hospitalization including diagnoses, interventions, plan of care. Discussed disease trajectory of dementia. Frankly and compassionately expressed concern with poor prognosis due to essentially no oral intake beside assist and encouragement. Also with recurrent hypoglycemia requiring glucagon injections. Medically recommended against feeding tube placement with risks and baseline dementia, unfortunately not reversing dementia but prolonging this process for her.  Explored values and goals of care important to the patient and family. Husband shares his wife has previously stated she would not want a feeding tube. Son and husband respect and agree with her wishes.   Again discussed hospice options and philosophy. Family prefers hospice facility placement. Discussed transition to comfort focused care with emphasis on comfort, symptom management, dignity as she nears EOL. Educated on comfort feeds. Family would like for sugars to continue to be checked and treated while she is hospitalized but emphasized that once Gali is transitioned to hospice  home, this will be discontinued. We discussed prolonging nature of ongoing glucagon for recurrent hypoglycemia. Family understands.  Answered questions regarding plan of care. [name] has PMT contact information.    Length of Stay: 9  Current Medications: Scheduled Meds:  . B-complex with vitamin C  1 tablet Oral Daily  . Chlorhexidine Gluconate Cloth  6 each Topical Daily  . enoxaparin (LOVENOX) injection  40 mg Subcutaneous Q24H  . feeding supplement (ENSURE ENLIVE)  237 mL Oral BID BM  . lactulose  20 g Oral Daily  . megestrol  400 mg Oral BID    Continuous Infusions:   PRN Meds: acetaminophen **OR** acetaminophen, ondansetron **OR** ondansetron (ZOFRAN) IV  Physical Exam Vitals and nursing note reviewed.  Constitutional:      General: She is awake.  HENT:     Head: Normocephalic and atraumatic.  Pulmonary:     Effort: No tachypnea, accessory muscle usage or respiratory distress.  Abdominal:     Tenderness: There is no abdominal tenderness.  Skin:    General: Skin is warm and dry.  Neurological:     Mental Status: She is alert.     Comments: Oriented to name. Otherwise disoriented with baseline dementia.   Psychiatric:        Attention and Perception: She is inattentive.        Cognition and Memory: Cognition is impaired.            Vital Signs: BP 115/80 (BP Location: Left Arm)   Pulse 78   Temp 97.7 F (36.5 C)   Resp 16   Ht 5\' 5"  (1.651 m)   Wt 61.2 kg   SpO2 100%   BMI 22.47 kg/m  SpO2: SpO2: 100 % O2 Device: O2 Device: Room Air O2 Flow Rate:    Intake/output summary:   Intake/Output Summary (Last 24 hours) at 08/02/2019 1137 Last data filed at 08/02/2019 0600 Gross per 24 hour  Intake --  Output 550 ml  Net -550 ml   LBM: Last BM Date: 08/01/19 Baseline Weight: Weight: 61.2 kg Most recent weight: Weight: 61.2 kg       Palliative Assessment/Data: PPS 20%      Patient Active Problem List   Diagnosis Date Noted  . Cystitis   .  Palliative care by specialist   . Goals of care, counseling/discussion   . Orthostatic hypotension 07/25/2019  . Pressure injury of skin 07/25/2019  . Dementia without behavioral disturbance ([place]) 07/24/2019  . 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 Acute metabolic encephalopathy [id number]  . Urethral stricture 07/24/2019  . Chronic indwelling Foley catheter 07/24/2019  . Severe sepsis ([place]) 07/01/2019  . Syncope 06/27/2019  . History of breast cancer 06/12/2019  . AKI (acute kidney injury) ([place]) 06/12/2019  . Generalized weakness 06/12/2019  . Hyperkalemia 06/12/2019  . Hypertensive urgency 06/12/2019  . Disorder of bursae of shoulder region 10/20/2018  . Lower GI bleeding 09/10/2018  . Hiatal hernia   . Melena   . Polyp of ascending colon   . Diverticulosis of large intestine without diverticulitis   . GIB (gastrointestinal bleeding) 09/05/2018  . LVH (left ventricular hypertrophy) due to hypertensive disease, without heart failure 04/10/2018  . Chest pain 03/31/2018  . Vasovagal syncope 03/20/2018  . Lichen planus [id number]  . Malignant neoplasm of upper-inner quadrant of right breast in female, estrogen receptor positive ([place]) 07/28/2016  . Gastric ulcer requiring drug therapy, chronic 01/23/2016  . MI (mitral incompetence) 07/22/2015  . Combined fat and carbohydrate induced hyperlipemia 12/26/2014  . Urinary tract infection associated with indwelling urethral catheter ([place]) 08/18/2014  . Blurry vision 08/18/2014  . Allergic rhinitis 07/12/2014  . Absolute anemia 07/12/2014  . Baker's cyst of knee 07/12/2014  . Atherosclerosis of coronary artery 07/12/2014  . CAFL (chronic airflow limitation) ([place]) 07/12/2014  . Dizziness 07/12/2014  . Acid reflux 07/12/2014  . 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 History of colon polyps 07/12/2014  . Hypercholesteremia 07/12/2014  . Malaise and fatigue 07/12/2014  . Heart attack ([place]) 07/12/2014  . Muscle  ache 07/12/2014  . Arthritis, degenerative 07/12/2014  . Bradycardia 08/23/2013  . CAD (coronary artery disease) 08/17/2013  . Diabetes ([place]) 08/17/2013  . Diabetes mellitus ([place]) 08/17/2013  . Peripheral vascular disease ([place]) 08/17/2013    Palliative Care Assessment & Plan   Patient Profile: 79 y.o. female  with past medical history of diabetes, mild dementia, HTN, hypotension on midodrine, breast cancer admitted on 07/24/2019 with confusion and hypotension. Recurrent hospitalization x2 for  sepsis secondary to UTI. 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 Foley changed 5/18 at urology office. Hospital admission for sepsis secondary to UTI, AKI, and acute encephalopathy. CT negative for acute findings. Patient completed course of antibiotics. Ongoing poor oral intake with dysphagia/large hiatal hernia on dysphagia diet. Recurrent episodes of hypoglycemia. Palliative medicine consultation for goals of care.   Assessment: Severe sepsis Recurrent UTI's Dementia without behavioral disturbance AKI Poor oral intake Adult failure to thrive Recurrent hypoglycemia Dysphagia Large hiatal hernia Orthostatic hypotension  Recommendations/Plan:  After further GOC discussion with husband and son, decision made for transition to comfort focused care with hospice facility placement. NO feeding tube as the patient has previously told her family she would not want this.   Continue comfort feeds. Patient needs assist and encouragement.   Family prefers to continue CBG checks and treatment for hypoglycemia inpatient. Family understands when she is transitioned to hospice facility, these interventions (along with other interventions not aimed at comfort) will be discontinued.   Low dose prn Roxanol for pain/dyspnea.  Continue foley catheter for urinary retention/EOL care.   RN CM notified of residential hospice referral. Family prefers [facility].    Code Status: DNR     Code Status Orders  (From admission, onward)         Start     Ordered   07/24/19 1924  Do not attempt resuscitation (DNR)  Continuous    Question Answer Comment  In the event of cardiac or respiratory ARREST Do not call a "code blue"   In the event of cardiac or respiratory ARREST Do not perform Intubation, CPR, defibrillation or ACLS   In the event of cardiac or respiratory ARREST Use medication by any route, position, wound care, and other measures to relive pain and suffering. May use oxygen, suction and manual treatment of airway obstruction as needed for comfort.      07/24/19 1927        Code Status History    Date Active Date Inactive Code Status Order ID Comments User Context   07/01/2019 1544 07/12/2019 2157 DNR [id number]  Lequita Halt, MD ED   07/01/2019 1544 07/01/2019 1544 Full Code [id number]  Lequita Halt, MD ED   06/27/2019 1743 06/28/2019 2258 DNR [id number]  Sharen Hones, MD ED   06/12/2019 0218 06/16/2019 0043 DNR [id number]  Athena Masse, MD ED   06/12/2019 0146 06/12/2019 0218 Full Code [id number]  Athena Masse, MD ED   09/10/2018 0056 09/14/2018 1755 Full Code [id number]  Mansy, Arvella Merles, MD ED   09/05/2018 0546 09/08/2018 1747 Partial Code [id number]  Harrie Foreman, MD Inpatient   03/31/2018 1723 04/01/2018 2155 Partial Code [id number]  Salary, Avel Peace, MD ED   08/18/2014 1831 08/20/2014 1300 Full Code [id number]  Theodoro Grist, MD Inpatient   Advance Care Planning Activity    Advance Directive Documentation     Most Recent Value  Type of Advance Directive  Out of facility DNR (pink MOST or yellow form)  Pre-existing out of facility DNR order (yellow form or pink MOST form)  Pink MOST form placed in chart (order not valid for inpatient use)  "MOST" Form in Place?  --       Prognosis:   Poor long-term prognosis: <2 weeks if not days once all life-prolonging interventions are discontinued. Patient with progressive dementia, very poor oral intake (essentially nothing) with  recurrent hypoglycemia requiring glucagon.    Discharge Planning:  [place]  plan was discussed with RN, Dr. Sherral Hammers, son [name]), husband [name]), RN CM, hospice liaison  Thank you for allowing the Palliative Medicine Team to assist in the care of this patient.   Time In: 1130 Time Out: 1210 Total Time 40 Prolonged Time Billed  no      Greater than 50%  of this time was spent counseling and coordinating care related to the above assessment and plan.  Ihor Dow, DNP, FNP-C Palliative Medicine Team  Phone: [phone] Fax: [phone]  Please contact Palliative Medicine Team phone at [phone] for questions and concerns.

## 2019-08-02 NOTE — TOC Progression Note (Addendum)
Transition of Care [facility]) - Progression Note    Patient Details  Name: [name] [name] MRN: [id number] Date of Birth: 1940-03-09  [facility] of [facility]) CM/SW Contact  Beverly Sessions, RN Phone Number: 08/02/2019, 2:47 PM  Clinical Narrative:    Palliative has discussed discharge disposition with husband and son.  They have decided on residential hospice.  Palliative has confirmed that husband wishes for [facility]osp[facility] home of [place].  RNCM has left VM for husband [name] ton confirm.  Referral made to Santiago Glad with [facility].  There is a waiting list at this time   Expected Discharge Plan: [place] Barriers to Discharge: Continued Medical Work up  Expected Discharge Plan and Services Expected Discharge Plan: [place] arrangements for the past 2 months: [place]                                       Social Determinants of Health (SDOH) Interventions    Readmission Risk Interventions Readmission Risk Prevention Plan 07/25/2019 07/04/2019  Transportation Screening Complete Complete  PCP or Specialist Appt within 3-5 Days - Complete  HRI or [place] - Complete  Medication Review (RN Care Manager) Complete Referral to Pharmacy  Palliative Care Screening Not Applicable -  [place] Complete -  Some recent data might be hidden

## 2019-08-02 NOTE — Progress Notes (Signed)
PROGRESS NOTE    [name] [name]  [id number] DOB: [birth year] DOA: 07/24/2019 PCP: Jerrol Banana., MD   Brief Narrative:  79 year old BF PMHx DM type II, HTN, also Hypotension on midodrine, HLD, 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  Found to have recurrent UTI. Currently further plan is continue to engage with family regarding goals of care.   Subjective: 5/27 A/O x2 (does not know where, why), follows some commands.  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 -Recently hospitalized from 07/01/2019 to 07/12/2019 with sepsis secondary to UTI -Foley was changed on 07/24/2019 at urology office  Urethral stricture -Patient has known urethral stricture followed by urology and has indwelling Foley catheter, last seen 07/24/19 -Foley catheter change in the outpatient on the morning of arrival on 07/24/2019.  AKI (acute kidney injury) ([place])   (baseline Cr 1.44 Recent Labs  Lab 07/28/19 0530 07/29/19 0655 07/30/19 0428 08/01/19 0521 08/02/19 0547  CREATININE 0.69 0.66 0.77 0.63 0.79  -Resolved with hydration  Acute metabolic encephalopathy/Dementia -Unsure of patient's baseline although BP stabilized cognition has not significantly improved.  Syncope with fall -Patient had multiple reasons for syncope and fall to include sepsis, orthostatic hypotension, autonomic dysfunction. -Correct underlying dysfunction -CT head negative for acute disease  Orthostatic hypotension/supine hypertension --BP 60/30 prior to arrival but BP very elevated after fluid bolus, as high as 197/91 --Evaluated by neurology in Jan 2021 for dizziness and syncope with consideration for starting florinef for autonomic hypotension --continue to monitor closely --fall precautions, compressive stockings -Continues to have orthostatic hypotension and supine hypertension. -Midodrine discontinued.  Diabetes mellitus uncontrolled with hypoglycemia associated with no complication (HCC) -Monitor for now.  Every 4 hours CBG.  No insulin. -5/27 D5-0.5% saline 70ml/hr.  Patient not eating  Dysphagia./Large hiatal hernia. Likely secondary from progression of her dementia, her lethargy with ongoing UTI as well as combination of large hiatal hernia. Currently on dysphagia 1 diet per speech therapy.  (Not eating)  History of breast cancer -Continue letrozole   Goals of care discussion. Had a extensive Discussion with patient's husband at bedside and son on the phone. Offered analysis of patient's current presentation with dysphagia, lethargy, poor p.o. intake and dementia with acute delirium. Explained how  recent septic bacteremia admission and its effect on patient's ongoing prognosis and current presentation. With patient's comorbidities and poor resilience patient's prognosis remains guarded. Family currently wants to continue current care but  would let us know if they decide to change goals of care to focus more on comfort.  Still remains with poor p.o. intake.  Family has not made any decisions.  Palliative care consulted for further assistance.  Patient's prognosis remains poor. Unable to reach the family.   Stage II decubitus ulcer Pressure Injury 07/25/19 Buttocks Medial Stage 2 -  Partial thickness loss of dermis presenting as a shallow open injury with a red, pink wound bed without slough. (Active)  07/25/19 0100  Location: Buttocks  Location Orientation: Medial  Staging: Stage 2 -  Partial thickness loss of dermis presenting as a shallow open injury with a red, pink wound bed without slough.  Wound Description (Comments):   Present on Admission: Yes        DVT prophylaxis:  Code Status: DNR Family Communication:  Disposition Plan:  Status is: Inpatient    Dispo: The patient is from: Home              Anticipated d/c is to: Hospice              Anticipated d/c date is: Awaiting insurance authorization              Patient currently unstable      Consultants:  Palliative care   Procedures/Significant Events:     I have personally reviewed and interpreted all radiology studies and my findings are as above.  VENTILATOR SETTINGS:    Cultures 5/18 blood LEFT AC negative final 5/18 blood RIGHT AC negative final  5/18 SARS coronavirus negative 5/19 MRSA by PCR negative   Antimicrobials: Anti-infectives (From admission, onward)   Start     Dose/Rate Stop   07/27/19 1400  cephALEXin (KEFLEX) capsule 500 mg  Status:  Discontinued     500 mg 07/27/19 1323   07/27/19 1400  cefdinir (OMNICEF) capsule 300 mg  Status:  Discontinued     300 mg 07/31/19 0745   07/26/19 1400  ceFAZolin (ANCEF) IVPB 1 g/50 mL premix  Status:  Discontinued     1 g 100 mL/hr over 30 Minutes 07/27/19 0949   07/26/19 0815  cephALEXin (KEFLEX) capsule 500 mg  Status:  Discontinued     500 mg 07/26/19 1325   07/25/19  1500  ceFEPIme (MAXIPIME) 2 g in sodium chloride 0.9 % 100 mL IVPB  Status:  Discontinued     2 g 200 mL/hr over 30 Minutes 07/26/19 0812   07/24/19 1930  ceFEPIme (MAXIPIME) 2 g in sodium chloride 0.9 % 100 mL IVPB  Status:  Discontinued     2 g 200 mL/hr over 30 Minutes 07/24/19 1930   07/24/19 1430  ceFEPIme (MAXIPIME) 2 g in sodium chloride 0.9 % 100 mL IVPB     2 g 200 mL/hr over 30 Minutes 07/24/19 1605       Devices    LINES / TUBES:  Foley catheter 5/18>>>    Continuous Infusions:   Objective: Vitals:   08/01/19 0456 08/01/19 1142 08/01/19 2044 08/02/19 0511  BP: 110/73 (!) 143/78 120/75 (!) 158/88  Pulse: (!) 55 78 85 79  Resp: 20 14 16 16   Temp: (!) 97.5 F (36.4 C) 98.5 F (36.9 C) 97.7 F (36.5 C) 98.3 F (36.8 C)  TempSrc: Oral  Oral Oral  SpO2: 98% 100% 96% 100%  Weight:      Height:        Intake/Output Summary (Last 24 hours) at 08/02/2019 0931 Last data filed at 08/02/2019 0600 Gross per 24 hour  Intake --  Output 550 ml  Net -550 ml  Filed Weights   07/24/19 1244  Weight: 61.2 kg    Examination:  General: A/O x3 (does not know why) no acute respiratory distress Eyes: negative scleral hemorrhage, negative anisocoria, negative icterus ENT: Negative Runny nose, negative gingival bleeding, Neck:  Negative scars, masses, torticollis, lymphadenopathy, JVD Lungs: Clear to auscultation bilaterally without wheezes or crackles Cardiovascular: Regular rate and rhythm without murmur gallop or rub normal S1 and S2 Abdomen: negative abdominal pain, nondistended, positive soft, bowel sounds, no rebound, no ascites, no appreciable mass Extremities: No significant cyanosis, clubbing, or edema bilateral lower extremities Skin: Negative rashes, lesions, ulcers Psychiatric:  Negative depression, negative anxiety, negative fatigue, negative mania  Central nervous system:  Cranial nerves II through XII intact, tongue/uvula midline, all extremities muscle  strength 5/5, sensation intact throughout,  negative dysarthria, negative expressive aphasia, negative receptive aphasia.  .     Data Reviewed: Care during the described time interval was provided by me .  I have reviewed this patient's available data, including medical history, events of note, physical examination, and all test results as part of my evaluation.   CBC: Recent Labs  Lab 07/28/19 0530 07/29/19 0655 07/30/19 0428 08/01/19 0521 08/02/19 0547  WBC 9.8 8.9 8.2 8.4 7.3  HGB 11.4* 10.1* 9.8* 9.9* 9.8*  HCT 35.9* 31.0* 30.0* 31.2* 30.1*  MCV 89.3 86.8 86.7 88.9 87.0  PLT 315 315 359 450* 123456*   Basic Metabolic Panel: Recent Labs  Lab 07/28/19 0530 07/29/19 0655 07/30/19 0428 08/01/19 0521 08/02/19 0547  NA 137 140 139 138 140  K 3.6 3.3* 3.6 3.9 3.7  CL 102 105 105 104 107  CO2 23 28 26 28 27   GLUCOSE 87 112* 103* 104* 103*  BUN 11 11 14 13 15   CREATININE 0.69 0.66 0.77 0.63 0.79  CALCIUM 8.7* 8.7* 8.5* 8.7* 8.5*  MG  --   --   --   --  1.7  PHOS  --   --   --   --  3.3   GFR: Estimated Creatinine Clearance: 50.5 mL/min (by C-G formula based on SCr of 0.79 mg/dL). Liver Function Tests: Recent Labs  Lab 08/02/19 0547  AST 19  ALT 12  ALKPHOS 43  BILITOT 0.6  PROT 5.7*  ALBUMIN 2.5*   No results for input(s): LIPASE, AMYLASE in the last 168 hours. No results for input(s): AMMONIA in the last 168 hours. Coagulation Profile: No results for input(s): INR, PROTIME in the last 168 hours. Cardiac Enzymes: No results for input(s): CKTOTAL, CKMB, CKMBINDEX, TROPONINI in the last 168 hours. BNP (last 3 results) No results for input(s): PROBNP in the last 8760 hours. HbA1C: No results for input(s): HGBA1C in the last 72 hours. CBG: Recent Labs  Lab 08/01/19 1136 08/01/19 1645 08/01/19 2106 08/02/19 0555 08/02/19 0805  GLUCAP 148* 83 114* 81 83   Lipid Profile: No results for input(s): CHOL, HDL, LDLCALC, TRIG, CHOLHDL, LDLDIRECT in the last 72  hours. Thyroid Function Tests: No results for input(s): TSH, T4TOTAL, FREET4, T3FREE, THYROIDAB in the last 72 hours. Anemia Panel: No results for input(s): VITAMINB12, FOLATE, FERRITIN, TIBC, IRON, RETICCTPCT in the last 72 hours. Urine analysis:    Component Value Date/Time   COLORURINE YELLOW (A) 07/24/2019 1856   APPEARANCEUR CLOUDY (A) 07/24/2019 1856   APPEARANCEUR Cloudy (A) 04/25/2015 1449   LABSPEC 1.021 07/24/2019 1856   LABSPEC 1.021 01/17/2014 1524   PHURINE 5.0 07/24/2019 1856   GLUCOSEU NEGATIVE 07/24/2019 1856   GLUCOSEU Negative 01/17/2014 1524  HGBUR MODERATE (A) 07/24/2019 1856   BILIRUBINUR NEGATIVE 07/24/2019 1856   BILIRUBINUR neg 06/30/2016 1619   BILIRUBINUR Negative 04/25/2015 1449   BILIRUBINUR Negative 01/17/2014 1524   KETONESUR 5 (A) 07/24/2019 1856   PROTEINUR 100 (A) 07/24/2019 1856   UROBILINOGEN 0.2 06/30/2016 1619   NITRITE NEGATIVE 07/24/2019 1856   LEUKOCYTESUR LARGE (A) 07/24/2019 1856   LEUKOCYTESUR 2+ 01/17/2014 1524   Sepsis Labs: @LABRCNTIP (procalcitonin:4,lacticidven:4)  ) Recent Results (from the past 240 hour(s))  Blood culture (routine x 2)     Status: None   Collection Time: 07/24/19  2:21 PM   Specimen: BLOOD  Result Value Ref Range Status   Specimen Description BLOOD Blood Culture adequate volume  Final   Special Requests   Final    BOTTLES DRAWN AEROBIC AND ANAEROBIC LEFT ANTECUBITAL   Culture   Final    NO GROWTH 5 DAYS Performed at [facility], [address], [place], [place] [address]    Report Status 07/29/2019 FINAL  Final  Blood culture (routine x 2)     Status: None   Collection Time: 07/24/19  2:26 PM   Specimen: BLOOD  Result Value Ref Range Status   Specimen Description BLOOD Blood Culture adequate volume  Final   Special Requests   Final    BOTTLES DRAWN AEROBIC AND ANAEROBIC RIGHT ANTECUBITAL   Culture   Final    NO GROWTH 5 DAYS Performed at [facility], [address]  [place], [place] [address]    Report Status 07/29/2019 FINAL  Final  SARS Coronavirus 2 by RT PCR (hospital order, performed in [facility] hospital lab) Nasopharyngeal Nasopharyngeal Swab     Status: None   Collection Time: 07/24/19  7:47 PM   Specimen: Nasopharyngeal Swab  Result Value Ref Range Status   SARS Coronavirus 2 NEGATIVE NEGATIVE Final    Comment: (NOTE) SARS-CoV-2 target nucleic acids are NOT DETECTED. The SARS-CoV-2 RNA is generally detectable in upper and lower respiratory specimens during the acute phase of infection. The lowest concentration of SARS-CoV-2 viral copies this assay can detect is 250 copies / mL. A negative result does not preclude SARS-CoV-2 infection and should not be used as the sole basis for treatment or other patient management decisions.  A negative result may occur with improper specimen collection / handling, submission of specimen other than nasopharyngeal swab, presence of viral mutation(s) within the areas targeted by this assay, and inadequate number of viral copies (<250 copies / mL). A negative result must be combined with clinical observations, patient history, and epidemiological information. Fact Sheet for Patients:   StrictlyIdeas.no Fact Sheet for Healthcare Providers: BankingDealers.co.za This test is not yet approved or cleared  by the [place] FDA and has been authorized for detection and/or diagnosis of SARS-CoV-2 by FDA under an Emergency Use Authorization (EUA).  This EUA will remain in effect (meaning this test can be used) for the duration of the COVID-19 declaration under Section 564(b)(1) of the Act, 21 U.S.C. section 360bbb-3(b)(1), unless the authorization is terminated or revoked sooner. Performed at [facility], [address] [place], [place] [address]   MRSA PCR Screening     Status: None   Collection Time: 07/25/19 12:44 AM   Specimen: Nasopharyngeal    Result Value Ref Range Status   MRSA by PCR NEGATIVE NEGATIVE Final    Comment:        The GeneXpert MRSA Assay (FDA approved for NASAL specimens only), is one component of  a comprehensive MRSA colonization surveillance program. It is not intended to diagnose MRSA infection nor to guide or monitor treatment for MRSA infections. Performed at [facility], [address] [place], [place] [address]          Radiology Studies: No results found.      Scheduled Meds: . B-complex with vitamin C  1 tablet Oral Daily  . Chlorhexidine Gluconate Cloth  6 each Topical Daily  . enoxaparin (LOVENOX) injection  40 mg Subcutaneous Q24H  . feeding supplement (ENSURE ENLIVE)  237 mL Oral BID BM  . lactulose  20 g Oral Daily  . megestrol  400 mg Oral BID   Continuous Infusions:   LOS: 9 days   The patient is critically ill with multiple organ systems failure and requires high complexity decision making for assessment and support, frequent evaluation and titration of therapies, application of advanced monitoring technologies and extensive interpretation of multiple databases. Critical Care Time devoted to patient care services described in this note  Time spent: 40 minutes     Shalie Schremp, Geraldo Docker, MD [facility] Pager [phone]  If 7PM-7AM, please contact night-coverage [email or web address] Password [facility] 08/02/2019, 9:31 AM

## 2019-08-02 NOTE — Care Management Important Message (Signed)
Important Message  Patient Details  Name: [name] [name] MRN: [id number] Date of Birth: 1940-03-19   Medicare Important Message Given:  Yes     Dannette Barbara 08/02/2019, 3:22 PM

## 2019-08-03 LAB — GLUCOSE, CAPILLARY
Glucose-Capillary: 72 mg/dL (ref 70–99)
Glucose-Capillary: 73 mg/dL (ref 70–99)
Glucose-Capillary: 73 mg/dL (ref 70–99)
Glucose-Capillary: 76 mg/dL (ref 70–99)
Glucose-Capillary: 84 mg/dL (ref 70–99)
Glucose-Capillary: 84 mg/dL (ref 70–99)

## 2019-08-03 MED ORDER — LISINOPRIL 2.5 MG PO TABS
2.5000 mg | ORAL_TABLET | Freq: Every day | ORAL | Status: DC
  Administered 2019-08-03 – 2019-08-07 (×5): 2.5 mg via ORAL
  Filled 2019-08-03 (×5): qty 1

## 2019-08-03 NOTE — Progress Notes (Addendum)
[facility] Room [facility] Liaison RN note.   Unfortunately, [facility]osp[facility] is not able to offer a room today. Family and TOC manager are aware [facility] liaison will follow up tomorrow or sooner if room becomes available.  If room becomes available over the holiday weekend, the [facility] RN will contact RN and TOC to arrange transfer. Please send discharge summary to [phone]. RN please call report to [phone]  Please call with any hospice related questions or concerns.   Thank you, Margaretmary Eddy, BSN, RN [facility] Liaison [phone]

## 2019-08-03 NOTE — Progress Notes (Signed)
Checked patient's blood sugar @ 0000 and it was 73 so I gave her some applesauce.  I rechecked blood sugar around 0545 and it increased to 84.  Will continue to monitor.  Christene Slates

## 2019-08-03 NOTE — Progress Notes (Signed)
PROGRESS NOTE    [name] [name]  [id number] DOB: [birth year] DOA: 07/24/2019 PCP: Jerrol Banana., MD   Brief Narrative:  79 year old BF PMHx DM type II, HTN, also Hypotension on midodrine, HLD, 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  Found to have recurrent UTI. Currently further plan is continue to engage with family regarding goals of care.   Subjective: 5/28 A/O x2 (does not know where, why), follows some commands.  Pleasant.  Negative CP.  Negative SOB.  Negative abdominal pain.    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 -Recently hospitalized from 07/01/2019 to 07/12/2019 with sepsis secondary to UTI -Foley was changed on 07/24/2019 at urology office  Urethral stricture -Patient has known urethral stricture followed by urology and has indwelling Foley catheter, last seen 07/24/19 -Foley catheter change in the outpatient on the morning of arrival on 07/24/2019.   AKI (acute kidney injury) ([place])  (baseline Cr 1.44 Recent Labs  Lab 07/28/19 0530 07/29/19 0655 07/30/19 0428 08/01/19 0521 08/02/19 0547  CREATININE 0.69 0.66 0.77 0.63 0.79  -Resolved with hydration  Acute metabolic encephalopathy/Dementia -Unsure of patient's baseline although BP stabilized cognition has not significantly improved.  Syncope with fall -Patient had multiple reasons for syncope and fall to include sepsis, orthostatic hypotension, autonomic dysfunction. -Correct underlying dysfunction -CT head negative for acute disease  Orthostatic hypotension/supine hypertension --BP 60/30 prior to arrival but BP very elevated after fluid bolus, as high as 197/91 --Evaluated by neurology in Jan 2021 for dizziness and syncope with consideration for starting florinef for autonomic hypotension --continue to monitor closely --fall precautions, compressive stockings -Continues to have orthostatic hypotension and supine hypertension. -Midodrine discontinued. -5/28 BP trending up lisinopril 2.5 mg  Diabetes mellitus uncontrolled with hypoglycemia associated with no complication ([place]) -Monitor for now.  Every 4 hours CBG.  No insulin. -5/27 D5-0.45% saline 36ml/hr.  Patient not eating  Dysphagia./Large hiatal hernia. Likely secondary from progression of her dementia, her lethargy with ongoing UTI as well as combination of large hiatal hernia. Currently on dysphagia 1 diet per speech therapy.  (Not eating)  History of breast cancer -Continue letrozole   Goals of care discussion. Had a extensive Discussion with patient's husband at bedside and son on the phone. Offered analysis of patient's current presentation with dysphagia, lethargy, poor p.o. intake and dementia with acute delirium. Explained how  recent septic bacteremia admission and its effect on patient's ongoing prognosis and current presentation. With patient's comorbidities and poor resilience patient's  prognosis remains guarded. Family currently wants to continue current care but  would let us know if they decide to change goals of care to focus more on comfort.  Still remains with poor p.o. intake.  Family has not made any decisions.  Palliative care consulted for further assistance.  Patient's prognosis remains poor. Unable to reach the family.   Stage II decubitus ulcer Pressure Injury 07/25/19 Buttocks Medial Stage 2 -  Partial thickness loss of dermis presenting as a shallow open injury with a red, pink wound bed without slough. (Active)  07/25/19 0100  Location: Buttocks  Location Orientation: Medial  Staging: Stage 2 -  Partial thickness loss of dermis presenting as a shallow open injury with a red, pink wound bed without slough.  Wound Description (Comments):   Present on Admission: Yes        DVT prophylaxis:  Code Status: DNR Family Communication:  Disposition Plan:  Status is: Inpatient    Dispo: The patient is from: Home              Anticipated d/c is to: Hospice              Anticipated d/c date is: Awaiting insurance authorization for residential hospice              Patient currently unstable      Consultants:  Palliative care   Procedures/Significant Events:     I have personally reviewed and interpreted all radiology studies and my findings are as above.  VENTILATOR SETTINGS:    Cultures 5/18 blood LEFT AC negative final 5/18 blood RIGHT AC negative final  5/18 SARS coronavirus negative 5/19 MRSA by PCR negative   Antimicrobials: Anti-infectives (From admission, onward)   Start     Dose/Rate Stop   07/27/19 1400  cephALEXin (KEFLEX) capsule 500 mg  Status:  Discontinued     500 mg 07/27/19 1323   07/27/19 1400  cefdinir (OMNICEF) capsule 300 mg  Status:  Discontinued     300 mg 07/31/19 0745   07/26/19 1400  ceFAZolin (ANCEF) IVPB 1 g/50 mL premix  Status:  Discontinued     1 g 100 mL/hr over 30 Minutes 07/27/19 0949   07/26/19  0815  cephALEXin (KEFLEX) capsule 500 mg  Status:  Discontinued     500 mg 07/26/19 1325   07/25/19 1500  ceFEPIme (MAXIPIME) 2 g in sodium chloride 0.9 % 100 mL IVPB  Status:  Discontinued     2 g 200 mL/hr over 30 Minutes 07/26/19 0812   07/24/19 1930  ceFEPIme (MAXIPIME) 2 g in sodium chloride 0.9 % 100 mL IVPB  Status:  Discontinued     2 g 200 mL/hr over 30 Minutes 07/24/19 1930   07/24/19 1430  ceFEPIme (MAXIPIME) 2 g in sodium chloride 0.9 % 100 mL IVPB     2 g 200 mL/hr over 30 Minutes 07/24/19 1605       Devices    LINES / TUBES:  Foley catheter 5/18>>>    Continuous Infusions: . dextrose 5 % and 0.45% NaCl 75 mL/hr at 08/03/19 0741     Objective: Vitals:   08/02/19 0511 08/02/19 1135 08/02/19 2017 08/03/19 0603  BP: (!) 158/88 115/80 (!) 150/77 (!) 145/65  Pulse: 79 78 93 82  Resp: 16  20 20   Temp: 98.3 F (36.8 C) 97.7 F (36.5 C) 98.7 F (37.1 C) 98.5 F (36.9 C)  TempSrc: Oral  Oral Oral  SpO2: 100% 100% 100% 98%  Weight:      Height:  Intake/Output Summary (Last 24 hours) at 08/03/2019 1150 Last data filed at 08/03/2019 [id number] Gross per 24 hour  Intake 1196.23 ml  Output 600 ml  Net 596.23 ml   Filed Weights   07/24/19 1244  Weight: 61.2 kg    Examination:  General: A/O x3 (does not know why) no acute respiratory distress Eyes: negative scleral hemorrhage, negative anisocoria, negative icterus ENT: Negative Runny nose, negative gingival bleeding, Neck:  Negative scars, masses, torticollis, lymphadenopathy, JVD Lungs: Clear to auscultation bilaterally without wheezes or crackles Cardiovascular: Regular rate and rhythm without murmur gallop or rub normal S1 and S2 Abdomen: negative abdominal pain, nondistended, positive soft, bowel sounds, no rebound, no ascites, no appreciable mass Extremities: No significant cyanosis, clubbing, or edema bilateral lower extremities Skin: Negative rashes, lesions, ulcers Psychiatric:  Negative  depression, negative anxiety, negative fatigue, negative mania  Central nervous system:  Cranial nerves II through XII intact, tongue/uvula midline, all extremities muscle strength 5/5, sensation intact throughout,  negative dysarthria, negative expressive aphasia, negative receptive aphasia.  .     Data Reviewed: Care during the described time interval was provided by me .  I have reviewed this patient's available data, including medical history, events of note, physical examination, and all test results as part of my evaluation.   CBC: Recent Labs  Lab 07/28/19 0530 07/29/19 0655 07/30/19 0428 08/01/19 0521 08/02/19 0547  WBC 9.8 8.9 8.2 8.4 7.3  HGB 11.4* 10.1* 9.8* 9.9* 9.8*  HCT 35.9* 31.0* 30.0* 31.2* 30.1*  MCV 89.3 86.8 86.7 88.9 87.0  PLT 315 315 359 450* [id number]*   Basic Metabolic Panel: Recent Labs  Lab 07/28/19 0530 07/29/19 0655 07/30/19 0428 08/01/19 0521 08/02/19 0547  NA 137 140 139 138 140  K 3.6 3.3* 3.6 3.9 3.7  CL 102 105 105 104 107  CO2 23 28 26 28 27   GLUCOSE 87 112* 103* 104* 103*  BUN 11 11 14 13 15   CREATININE 0.69 0.66 0.77 0.63 0.79  CALCIUM 8.7* 8.7* 8.5* 8.7* 8.5*  MG  --   --   --   --  1.7  PHOS  --   --   --   --  3.3   GFR: Estimated Creatinine Clearance: 50.5 mL/min (by C-G formula based on SCr of 0.79 mg/dL). Liver Function Tests: Recent Labs  Lab 08/02/19 0547  AST 19  ALT 12  ALKPHOS 43  BILITOT 0.6  PROT 5.7*  ALBUMIN 2.5*   No results for input(s): LIPASE, AMYLASE in the last 168 hours. No results for input(s): AMMONIA in the last 168 hours. Coagulation Profile: No results for input(s): INR, PROTIME in the last 168 hours. Cardiac Enzymes: No results for input(s): CKTOTAL, CKMB, CKMBINDEX, TROPONINI in the last 168 hours. BNP (last 3 results) No results for input(s): PROBNP in the last 8760 hours. HbA1C: No results for input(s): HGBA1C in the last 72 hours. CBG: Recent Labs  Lab 08/02/19 1306 08/02/19 1756  08/03/19 0002 08/03/19 0545 08/03/19 0800  GLUCAP 136* 84 73 84 73   Lipid Profile: No results for input(s): CHOL, HDL, LDLCALC, TRIG, CHOLHDL, LDLDIRECT in the last 72 hours. Thyroid Function Tests: No results for input(s): TSH, T4TOTAL, FREET4, T3FREE, THYROIDAB in the last 72 hours. Anemia Panel: No results for input(s): VITAMINB12, FOLATE, FERRITIN, TIBC, IRON, RETICCTPCT in the last 72 hours. Urine analysis:    Component Value Date/Time   COLORURINE YELLOW (A) 07/24/2019 1856   APPEARANCEUR CLOUDY (A) 07/24/2019 1856   APPEARANCEUR Cloudy (A)  04/25/2015 1449   LABSPEC 1.021 07/24/2019 1856   LABSPEC 1.021 01/17/2014 1524   PHURINE 5.0 07/24/2019 1856   GLUCOSEU NEGATIVE 07/24/2019 1856   GLUCOSEU Negative 01/17/2014 1524   HGBUR MODERATE (A) 07/24/2019 1856   BILIRUBINUR NEGATIVE 07/24/2019 1856   BILIRUBINUR neg 06/30/2016 1619   BILIRUBINUR Negative 04/25/2015 1449   BILIRUBINUR Negative 01/17/2014 1524   KETONESUR 5 (A) 07/24/2019 1856   PROTEINUR 100 (A) 07/24/2019 1856   UROBILINOGEN 0.2 06/30/2016 1619   NITRITE NEGATIVE 07/24/2019 1856   LEUKOCYTESUR LARGE (A) 07/24/2019 1856   LEUKOCYTESUR 2+ 01/17/2014 1524   Sepsis Labs: @LABRCNTIP (procalcitonin:4,lacticidven:4)  ) Recent Results (from the past 240 hour(s))  Blood culture (routine x 2)     Status: None   Collection Time: 07/24/19  2:21 PM   Specimen: BLOOD  Result Value Ref Range Status   Specimen Description BLOOD Blood Culture adequate volume  Final   Special Requests   Final    BOTTLES DRAWN AEROBIC AND ANAEROBIC LEFT ANTECUBITAL   Culture   Final    NO GROWTH 5 DAYS Performed at [facility], [address], [place], [place] [address]    Report Status 07/29/2019 FINAL  Final  Blood culture (routine x 2)     Status: None   Collection Time: 07/24/19  2:26 PM   Specimen: BLOOD  Result Value Ref Range Status   Specimen Description BLOOD Blood Culture adequate volume  Final   Special  Requests   Final    BOTTLES DRAWN AEROBIC AND ANAEROBIC RIGHT ANTECUBITAL   Culture   Final    NO GROWTH 5 DAYS Performed at [facility], [address], [place], [place] [address]    Report Status 07/29/2019 FINAL  Final  SARS Coronavirus 2 by RT PCR (hospital order, performed in [facility] hospital lab) Nasopharyngeal Nasopharyngeal Swab     Status: None   Collection Time: 07/24/19  7:47 PM   Specimen: Nasopharyngeal Swab  Result Value Ref Range Status   SARS Coronavirus 2 NEGATIVE NEGATIVE Final    Comment: (NOTE) SARS-CoV-2 target nucleic acids are NOT DETECTED. The SARS-CoV-2 RNA is generally detectable in upper and lower respiratory specimens during the acute phase of infection. The lowest concentration of SARS-CoV-2 viral copies this assay can detect is 250 copies / mL. A negative result does not preclude SARS-CoV-2 infection and should not be used as the sole basis for treatment or other patient management decisions.  A negative result may occur with improper specimen collection / handling, submission of specimen other than nasopharyngeal swab, presence of viral mutation(s) within the areas targeted by this assay, and inadequate number of viral copies (<250 copies / mL). A negative result must be combined with clinical observations, patient history, and epidemiological information. Fact Sheet for Patients:   StrictlyIdeas.no Fact Sheet for Healthcare Providers: BankingDealers.co.za This test is not yet approved or cleared  by the [place] FDA and has been authorized for detection and/or diagnosis of SARS-CoV-2 by FDA under an Emergency Use Authorization (EUA).  This EUA will remain in effect (meaning this test can be used) for the duration of the COVID-19 declaration under Section 564(b)(1) of the Act, 21 U.S.C. section 360bbb-3(b)(1), unless the authorization is terminated or revoked sooner. Performed at  [facility], [address] [place], [place] [address]   MRSA PCR Screening     Status: None   Collection Time: 07/25/19 12:44 AM   Specimen: Nasopharyngeal  Result Value Ref Range Status  MRSA by PCR NEGATIVE NEGATIVE Final    Comment:        The GeneXpert MRSA Assay (FDA approved for NASAL specimens only), is one component of a comprehensive MRSA colonization surveillance program. It is not intended to diagnose MRSA infection nor to guide or monitor treatment for MRSA infections. Performed at [facility], [address], [place], [place] [address]          Radiology Studies: No results found.      Scheduled Meds: . Chlorhexidine Gluconate Cloth  6 each Topical Daily  . feeding supplement (ENSURE ENLIVE)  237 mL Oral BID BM  . lactulose  20 g Oral Daily  . megestrol  400 mg Oral BID   Continuous Infusions: . dextrose 5 % and 0.45% NaCl 75 mL/hr at 08/03/19 0741     LOS: 10 days   The patient is critically ill with multiple organ systems failure and requires high complexity decision making for assessment and support, frequent evaluation and titration of therapies, application of advanced monitoring technologies and extensive interpretation of multiple databases. Critical Care Time devoted to patient care services described in this note  Time spent: 40 minutes     WOODS, Geraldo Docker, MD [facility] Pager [phone]  If 7PM-7AM, please contact night-coverage [email or web address] Password [facility] 08/03/2019, 11:50 AM

## 2019-08-04 LAB — GLUCOSE, CAPILLARY
Glucose-Capillary: 81 mg/dL (ref 70–99)
Glucose-Capillary: 89 mg/dL (ref 70–99)

## 2019-08-04 NOTE — TOC Progression Note (Signed)
Transition of Care [facility]) - Progression Note    Patient Details  Name: [name] [name] MRN: [id number] Date of Birth: 08-19-1940  Transition of Care [facility]) CM/SW Contact  Meriel Flavors, [place] Phone Number: 08/04/2019, 4:47 PM  Clinical Narrative:    5/29: CSW was able to confirm Pt is on waiting list with hospice, [facility] [phone] but the waiting list has several Pt's on it right now.    Expected Discharge Plan: Skilled Nursing Facility Barriers to Discharge: Continued Medical Work up  Expected Discharge Plan and Services Expected Discharge Plan: [place] arrangements for the past 2 months: [place]                                       Social Determinants of Health (SDOH) Interventions    Readmission Risk Interventions Readmission Risk Prevention Plan 07/25/2019 07/04/2019  Transportation Screening Complete Complete  PCP or Specialist Appt within 3-5 Days - Complete  HRI or [place] - Complete  Medication Review (RN Care Manager) Complete Referral to Pharmacy  Palliative Care Screening Not Applicable -  [place] Complete -  Some recent data might be hidden

## 2019-08-04 NOTE — Progress Notes (Signed)
PROGRESS NOTE    [name] [name]  [id number] DOB: 03-28-1940 DOA: 07/24/2019 PCP: Jerrol Banana., MD   Brief Narrative:  79 year old BF PMHx DM type II, HTN, also Hypotension on midodrine, HLD, 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  Found to have recurrent UTI. Currently further plan is continue to engage with family regarding goals of care.   Subjective: 5/29 afebrile overnight, somnolent, difficult to arouse.  Does not follow commands.  Mildly withdraws to painful stimuli.   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 -Recently hospitalized from 07/01/2019 to 07/12/2019 with sepsis secondary to UTI -Foley was changed on 07/24/2019 at urology office  Urethral stricture -Patient has known urethral stricture followed by urology and has indwelling Foley catheter, last seen 07/24/19 -Foley catheter change in the outpatient on the morning of arrival on 07/24/2019.  AKI  (acute kidney injury) ([place])  (baseline Cr 1.44 Recent Labs  Lab 07/29/19 0655 07/30/19 0428 08/01/19 0521 08/02/19 0547  CREATININE 0.66 0.77 0.63 0.79  -Resolved with hydration  Acute metabolic encephalopathy/Dementia -Unsure of patient's baseline although BP stabilized cognition has not significantly improved.  Syncope with fall -Patient had multiple reasons for syncope and fall to include sepsis, orthostatic hypotension, autonomic dysfunction. -Correct underlying dysfunction -CT head negative for acute disease -Focus on comfort care  Orthostatic hypotension/supine hypertension --BP 60/30 prior to arrival but BP very elevated after fluid bolus, as high as 197/91 --Evaluated by neurology in Jan 2021 for dizziness and syncope with consideration for starting florinef for autonomic hypotension --continue to monitor closely --fall precautions, compressive stockings -Continues to have orthostatic hypotension and supine hypertension. -Midodrine discontinued. -5/28 BP trending up lisinopril 2.5 mg  Diabetes mellitus uncontrolled with hypoglycemia associated with no complication ([place]) -Monitor for now.  Every 4 hours CBG.  No insulin. -5/27 D5-0.45% saline 34ml/hr.  Patient not eating  Dysphagia./Large hiatal hernia. Likely secondary from progression of her dementia, her lethargy with ongoing UTI as well as combination of large hiatal hernia. Currently on dysphagia 1 diet per speech therapy.  (Not eating) Focus on comfort care  History of breast cancer -Continue letrozole   Goals of care discussion. Had a extensive Discussion with patient's husband at bedside and son on the phone. Offered analysis of patient's current presentation with dysphagia, lethargy, poor p.o. intake and dementia with acute delirium. Explained how  recent septic bacteremia admission and its effect on patient's ongoing prognosis and current presentation. With patient's comorbidities and poor  resilience patient's prognosis remains guarded. Family currently wants to continue current care but would  let us know if they decide to change goals of care to focus more on comfort.  Still remains with poor p.o. intake.  Family has not made any decisions.  Palliative care consulted for further assistance.  Patient's prognosis remains poor. Unable to reach the family.   Stage II decubitus ulcer Pressure Injury 07/25/19 Buttocks Medial Stage 2 -  Partial thickness loss of dermis presenting as a shallow open injury with a red, pink wound bed without slough. (Active)  07/25/19 0100  Location: Buttocks  Location Orientation: Medial  Staging: Stage 2 -  Partial thickness loss of dermis presenting as a shallow open injury with a red, pink wound bed without slough.  Wound Description (Comments):   Present on Admission: Yes        DVT prophylaxis:  Code Status: DNR Family Communication:  Disposition Plan:  Status is: Inpatient    Dispo: The patient is from: Home              Anticipated d/c is to: Hospice              Anticipated d/c date is: Awaiting insurance authorization for residential hospice              Patient currently unstable      Consultants:  Palliative care   Procedures/Significant Events:     I have personally reviewed and interpreted all radiology studies and my findings are as above.  VENTILATOR SETTINGS:    Cultures 5/18 blood LEFT AC negative final 5/18 blood RIGHT AC negative final  5/18 SARS coronavirus negative 5/19 MRSA by PCR negative   Antimicrobials: Anti-infectives (From admission, onward)   Start     Dose/Rate Stop   07/27/19 1400  cephALEXin (KEFLEX) capsule 500 mg  Status:  Discontinued     500 mg 07/27/19 1323   07/27/19 1400  cefdinir (OMNICEF) capsule 300 mg  Status:  Discontinued     300 mg 07/31/19 0745   07/26/19 1400  ceFAZolin (ANCEF) IVPB 1 g/50 mL premix  Status:  Discontinued     1 g 100 mL/hr over 30 Minutes  07/27/19 0949   07/26/19 0815  cephALEXin (KEFLEX) capsule 500 mg  Status:  Discontinued     500 mg 07/26/19 1325   07/25/19 1500  ceFEPIme (MAXIPIME) 2 g in sodium chloride 0.9 % 100 mL IVPB  Status:  Discontinued     2 g 200 mL/hr over 30 Minutes 07/26/19 0812   07/24/19 1930  ceFEPIme (MAXIPIME) 2 g in sodium chloride 0.9 % 100 mL IVPB  Status:  Discontinued     2 g 200 mL/hr over 30 Minutes 07/24/19 1930   07/24/19 1430  ceFEPIme (MAXIPIME) 2 g in sodium chloride 0.9 % 100 mL IVPB     2 g 200 mL/hr over 30 Minutes 07/24/19 1605       Devices    LINES / TUBES:  Foley catheter 5/18>>>    Continuous Infusions: . dextrose 5 % and 0.45% NaCl 50 mL/hr at 08/04/19 0031     Objective: Vitals:   08/03/19 0603 08/03/19 1255 08/03/19 2058 08/04/19 0426  BP: (!) 145/65 (!) 161/88 130/64 (!) 134/59  Pulse: 82 86 80 73  Resp: 20 14 16 18   Temp: 98.5 F (36.9 C) 97.9 F (36.6 C) 98.3 F (36.8 C) 98.3 F (36.8 C)  TempSrc: Oral  Oral Oral  SpO2: 98% 98% 100% 100%  Weight:      Height:  Intake/Output Summary (Last 24 hours) at 08/04/2019 0846 Last data filed at 08/04/2019 [id number] Gross per 24 hour  Intake 1276.52 ml  Output 1400 ml  Net -123.48 ml   Filed Weights   07/24/19 1244  Weight: 61.2 kg    Examination:  General: Somnolent, difficult to arouse, does not follow commands no acute respiratory distress Eyes: negative scleral hemorrhage, negative anisocoria, negative icterus ENT: Negative Runny nose, negative gingival bleeding, Neck:  Negative scars, masses, torticollis, lymphadenopathy, JVD Lungs: Clear to auscultation bilaterally without wheezes or crackles Cardiovascular: Regular rate and rhythm without murmur gallop or rub normal S1 and S2 Abdomen: negative abdominal pain, nondistended, positive soft, bowel sounds, no rebound, no ascites, no appreciable mass Extremities: No significant cyanosis, clubbing, or edema bilateral lower extremities Skin:  Negative rashes, lesions, ulcers Psychiatric:  Negative depression, negative anxiety, negative fatigue, negative mania  Central nervous system:  Cranial nerves II through XII intact, tongue/uvula midline, all extremities muscle strength 5/5, sensation intact throughout,  negative dysarthria, negative expressive aphasia, negative receptive aphasia.  .     Data Reviewed: Care during the described time interval was provided by me .  I have reviewed this patient's available data, including medical history, events of note, physical examination, and all test results as part of my evaluation.   CBC: Recent Labs  Lab 07/29/19 0655 07/30/19 0428 08/01/19 0521 08/02/19 0547  WBC 8.9 8.2 8.4 7.3  HGB 10.1* 9.8* 9.9* 9.8*  HCT 31.0* 30.0* 31.2* 30.1*  MCV 86.8 86.7 88.9 87.0  PLT 315 359 450* [id number]*   Basic Metabolic Panel: Recent Labs  Lab 07/29/19 0655 07/30/19 0428 08/01/19 0521 08/02/19 0547  NA 140 139 138 140  K 3.3* 3.6 3.9 3.7  CL 105 105 104 107  CO2 28 26 28 27   GLUCOSE 112* 103* 104* 103*  BUN 11 14 13 15   CREATININE 0.66 0.77 0.63 0.79  CALCIUM 8.7* 8.5* 8.7* 8.5*  MG  --   --   --  1.7  PHOS  --   --   --  3.3   GFR: Estimated Creatinine Clearance: 50.5 mL/min (by C-G formula based on SCr of 0.79 mg/dL). Liver Function Tests: Recent Labs  Lab 08/02/19 0547  AST 19  ALT 12  ALKPHOS 43  BILITOT 0.6  PROT 5.7*  ALBUMIN 2.5*   No results for input(s): LIPASE, AMYLASE in the last 168 hours. No results for input(s): AMMONIA in the last 168 hours. Coagulation Profile: No results for input(s): INR, PROTIME in the last 168 hours. Cardiac Enzymes: No results for input(s): CKTOTAL, CKMB, CKMBINDEX, TROPONINI in the last 168 hours. BNP (last 3 results) No results for input(s): PROBNP in the last 8760 hours. HbA1C: No results for input(s): HGBA1C in the last 72 hours. CBG: Recent Labs  Lab 08/03/19 0800 08/03/19 1206 08/03/19 1727 08/03/19 2331 08/04/19 0811    GLUCAP 73 76 84 72 81   Lipid Profile: No results for input(s): CHOL, HDL, LDLCALC, TRIG, CHOLHDL, LDLDIRECT in the last 72 hours. Thyroid Function Tests: No results for input(s): TSH, T4TOTAL, FREET4, T3FREE, THYROIDAB in the last 72 hours. Anemia Panel: No results for input(s): VITAMINB12, FOLATE, FERRITIN, TIBC, IRON, RETICCTPCT in the last 72 hours. Urine analysis:    Component Value Date/Time   COLORURINE YELLOW (A) 07/24/2019 1856   APPEARANCEUR CLOUDY (A) 07/24/2019 1856   APPEARANCEUR Cloudy (A) 04/25/2015 1449   LABSPEC 1.021 07/24/2019 1856   LABSPEC 1.021 01/17/2014 1524   PHURINE 5.0 07/24/2019 1856  GLUCOSEU NEGATIVE 07/24/2019 1856   GLUCOSEU Negative 01/17/2014 1524   HGBUR MODERATE (A) 07/24/2019 1856   BILIRUBINUR NEGATIVE 07/24/2019 1856   BILIRUBINUR neg 06/30/2016 1619   BILIRUBINUR Negative 04/25/2015 1449   BILIRUBINUR Negative 01/17/2014 1524   KETONESUR 5 (A) 07/24/2019 1856   PROTEINUR 100 (A) 07/24/2019 1856   UROBILINOGEN 0.2 06/30/2016 1619   NITRITE NEGATIVE 07/24/2019 1856   LEUKOCYTESUR LARGE (A) 07/24/2019 1856   LEUKOCYTESUR 2+ 01/17/2014 1524   Sepsis Labs: @LABRCNTIP (procalcitonin:4,lacticidven:4)  ) No results found for this or any previous visit (from the past 240 hour(s)).       Radiology Studies: No results found.      Scheduled Meds: . Chlorhexidine Gluconate Cloth  6 each Topical Daily  . feeding supplement (ENSURE ENLIVE)  237 mL Oral BID BM  . lactulose  20 g Oral Daily  . lisinopril  2.5 mg Oral Daily  . megestrol  400 mg Oral BID   Continuous Infusions: . dextrose 5 % and 0.45% NaCl 50 mL/hr at 08/04/19 0031     LOS: 11 days   The patient is critically ill with multiple organ systems failure and requires high complexity decision making for assessment and support, frequent evaluation and titration of therapies, application of advanced monitoring technologies and extensive interpretation of multiple databases.  Critical Care Time devoted to patient care services described in this note  Time spent: 40 minutes     Aidel Davisson, Geraldo Docker, MD [facility] Pager [phone]  If 7PM-7AM, please contact night-coverage [email or web address] Password [facility] 08/04/2019, 8:46 AM

## 2019-08-05 LAB — GLUCOSE, CAPILLARY
Glucose-Capillary: 107 mg/dL — ABNORMAL HIGH (ref 70–99)
Glucose-Capillary: 120 mg/dL — ABNORMAL HIGH (ref 70–99)
Glucose-Capillary: 60 mg/dL — ABNORMAL LOW (ref 70–99)
Glucose-Capillary: 90 mg/dL (ref 70–99)
Glucose-Capillary: 98 mg/dL (ref 70–99)

## 2019-08-05 NOTE — TOC Transition Note (Signed)
Transition of Care [facility]) - CM/SW Discharge Note   Patient Details  Name: [name] [name] MRN: [id number] Date of Birth: [birth year]  [facility] CM/SW Contact:  Meriel Flavors, LCSW Phone Number: 08/05/2019, 5:53 PM   Clinical Narrative:    5/30: Damaris Schooner with [facility], 3 of our hospice Pt's went this weekend. This Pt likely will have a bed Monday.     Final next level of care: Skilled Nursing Facility Barriers to Discharge: Continued Medical Work up   Patient Goals and CMS Choice        Discharge Placement                       Discharge Plan and Services                                     Social Determinants of Health (SDOH) Interventions     Readmission Risk Interventions Readmission Risk Prevention Plan 07/25/2019 07/04/2019  Transportation Screening Complete Complete  PCP or Specialist Appt within 3-5 Days - Complete  HRI or [place] - Complete  Medication Review (RN Care Manager) Complete Referral to Pharmacy  Palliative Care Screening Not Applicable -  [place] Complete -  Some recent data might be hidden

## 2019-08-05 NOTE — Progress Notes (Signed)
PROGRESS NOTE    [name] [name]  [id number] DOB: 08-20-1940 DOA: 07/24/2019 PCP: Jerrol Banana., MD   Brief Narrative:  79 year old BF PMHx DM type II, HTN, also Hypotension on midodrine, HLD, 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  Found to have recurrent UTI. Currently further plan is continue to engage with family regarding goals of care.   Subjective: 5/30 A/O x3 (does not know why) much more alert today, however grandson is in the room.   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 -Recently hospitalized from 07/01/2019 to 07/12/2019 with sepsis secondary to UTI -Foley was changed on 07/24/2019 at urology office  Urethral stricture -Patient has known urethral stricture followed by urology and has indwelling Foley catheter, last seen 07/24/19 -Foley catheter change in the outpatient on the morning of arrival on 07/24/2019.  AKI (acute kidney injury) ([place])   (baseline Cr 1.44 Recent Labs  Lab 07/30/19 0428 08/01/19 0521 08/02/19 0547  CREATININE 0.77 0.63 0.79  -Resolved with hydration  Acute metabolic encephalopathy/Dementia -Unsure of patient's baseline although BP stabilized cognition has not significantly improved.  Syncope with fall -Patient had multiple reasons for syncope and fall to include sepsis, orthostatic hypotension, autonomic dysfunction. -Correct underlying dysfunction -CT head negative for acute disease -Focus on comfort care  Orthostatic hypotension/supine hypertension --BP 60/30 prior to arrival but BP very elevated after fluid bolus, as high as 197/91 --Evaluated by neurology in Jan 2021 for dizziness and syncope with consideration for starting florinef for autonomic hypotension --continue to monitor closely --fall precautions, compressive stockings -Continues to have orthostatic hypotension and supine hypertension. -Midodrine discontinued. -5/28 BP trending up lisinopril 2.5 mg  Diabetes mellitus uncontrolled with hypoglycemia associated with no complication ([place]) -Monitor for now.  Every 4 hours CBG.  No insulin. -5/27 D5-0.45% saline 23ml/hr.  Patient not eating  Dysphagia./Large hiatal hernia. Likely secondary from progression of her dementia, her lethargy with ongoing UTI as well as combination of large hiatal hernia. Currently on dysphagia 1 diet per speech therapy.  (Not eating) Focus on comfort care  History of breast cancer -Continue letrozole   Goals of care discussion. Had a extensive Discussion with patient's husband at bedside and son on the phone. Offered analysis of patient's current presentation with dysphagia, lethargy, poor p.o. intake and dementia with acute delirium. Explained how  recent septic bacteremia admission and its effect on patient's ongoing prognosis and current presentation. With patient's comorbidities and poor resilience patient's prognosis remains guarded. Family  currently wants to continue current care but would let us know if  they decide to change goals of care to focus more on comfort.  Still remains with poor p.o. intake.  Family has not made any decisions.  Palliative care consulted for further assistance.  Patient's prognosis remains poor. Unable to reach the family.   Stage II decubitus ulcer Pressure Injury 07/25/19 Buttocks Medial Stage 2 -  Partial thickness loss of dermis presenting as a shallow open injury with a red, pink wound bed without slough. (Active)  07/25/19 0100  Location: Buttocks  Location Orientation: Medial  Staging: Stage 2 -  Partial thickness loss of dermis presenting as a shallow open injury with a red, pink wound bed without slough.  Wound Description (Comments):   Present on Admission: Yes        DVT prophylaxis:  Code Status: DNR Family Communication:  Disposition Plan:  Status is: Inpatient    Dispo: The patient is from: Home              Anticipated d/c is to: Hospice              Anticipated d/c date is: Awaiting insurance authorization for residential hospice              Patient currently unstable      Consultants:  Palliative care   Procedures/Significant Events:     I have personally reviewed and interpreted all radiology studies and my findings are as above.  VENTILATOR SETTINGS:    Cultures 5/18 blood LEFT AC negative final 5/18 blood RIGHT AC negative final  5/18 SARS coronavirus negative 5/19 MRSA by PCR negative   Antimicrobials: Anti-infectives (From admission, onward)   Start     Dose/Rate Stop   07/27/19 1400  cephALEXin (KEFLEX) capsule 500 mg  Status:  Discontinued     500 mg 07/27/19 1323   07/27/19 1400  cefdinir (OMNICEF) capsule 300 mg  Status:  Discontinued     300 mg 07/31/19 0745   07/26/19 1400  ceFAZolin (ANCEF) IVPB 1 g/50 mL premix  Status:  Discontinued     1 g 100 mL/hr over 30 Minutes 07/27/19 0949   07/26/19 0815  cephALEXin (KEFLEX) capsule  500 mg  Status:  Discontinued     500 mg 07/26/19 1325   07/25/19 1500  ceFEPIme (MAXIPIME) 2 g in sodium chloride 0.9 % 100 mL IVPB  Status:  Discontinued     2 g 200 mL/hr over 30 Minutes 07/26/19 0812   07/24/19 1930  ceFEPIme (MAXIPIME) 2 g in sodium chloride 0.9 % 100 mL IVPB  Status:  Discontinued     2 g 200 mL/hr over 30 Minutes 07/24/19 1930   07/24/19 1430  ceFEPIme (MAXIPIME) 2 g in sodium chloride 0.9 % 100 mL IVPB     2 g 200 mL/hr over 30 Minutes 07/24/19 1605       Devices    LINES / TUBES:  Foley catheter 5/18>>>    Continuous Infusions: . dextrose 5 % and 0.45% NaCl 50 mL/hr at 08/04/19 1742     Objective: Vitals:   08/04/19 0426 08/04/19 1211 08/04/19 2114 08/05/19 0529  BP: (!) 134/59 105/81 128/62 135/80  Pulse: 73 68 87 76  Resp: 18 16 20 18   Temp: 98.3 F (36.8 C) 98.2 F (36.8 C) 98.9 F (37.2 C) 98.6 F (37 C)  TempSrc: Oral Oral Oral Oral  SpO2: 100%  99% 100%  Weight:      Height:        Intake/Output Summary (Last 24  hours) at 08/05/2019 [id number] Last data filed at 08/05/2019 0636 Gross per 24 hour  Intake 858.15 ml  Output --  Net 858.15 ml   Filed Weights   07/24/19 1244  Weight: 61.2 kg    Physical Exam:  General: A/O x1 (does not know why) no acute respiratory distress, cachectic Eyes: negative scleral hemorrhage, negative anisocoria, negative icterus ENT: Negative Runny nose, negative gingival bleeding, Neck:  Negative scars, masses, torticollis, lymphadenopathy, JVD Lungs: Clear to auscultation bilaterally without wheezes or crackles Cardiovascular: Regular rate and rhythm without murmur gallop or rub normal S1 and S2 Abdomen: negative abdominal pain, nondistended, positive soft, bowel sounds, no rebound, no ascites, no appreciable mass Extremities: No significant cyanosis, clubbing, or edema bilateral lower extremities Skin: Negative rashes, lesions, ulcers Psychiatric:  Negative depression, negative anxiety, negative  fatigue, negative mania  Central nervous system:  Cranial nerves II through XII intact, tongue/uvula midline, all extremities muscle strength 5/5, sensation intact throughout, negative dysarthria, negative expressive aphasia, negative receptive aphasia.  .     Data Reviewed: Care during the described time interval was provided by me .  I have reviewed this patient's available data, including medical history, events of note, physical examination, and all test results as part of my evaluation.   CBC: Recent Labs  Lab 07/30/19 0428 08/01/19 0521 08/02/19 0547  WBC 8.2 8.4 7.3  HGB 9.8* 9.9* 9.8*  HCT 30.0* 31.2* 30.1*  MCV 86.7 88.9 87.0  PLT 359 450* 123456*   Basic Metabolic Panel: Recent Labs  Lab 07/30/19 0428 08/01/19 0521 08/02/19 0547  NA 139 138 140  K 3.6 3.9 3.7  CL 105 104 107  CO2 26 28 27   GLUCOSE 103* 104* 103*  BUN 14 13 15   CREATININE 0.77 0.63 0.79  CALCIUM 8.5* 8.7* 8.5*  MG  --   --  1.7  PHOS  --   --  3.3   GFR: Estimated Creatinine Clearance: 50.5 mL/min (by C-G formula based on SCr of 0.79 mg/dL). Liver Function Tests: Recent Labs  Lab 08/02/19 0547  AST 19  ALT 12  ALKPHOS 43  BILITOT 0.6  PROT 5.7*  ALBUMIN 2.5*   No results for input(s): LIPASE, AMYLASE in the last 168 hours. No results for input(s): AMMONIA in the last 168 hours. Coagulation Profile: No results for input(s): INR, PROTIME in the last 168 hours. Cardiac Enzymes: No results for input(s): CKTOTAL, CKMB, CKMBINDEX, TROPONINI in the last 168 hours. BNP (last 3 results) No results for input(s): PROBNP in the last 8760 hours. HbA1C: No results for input(s): HGBA1C in the last 72 hours. CBG: Recent Labs  Lab 08/03/19 2331 08/04/19 0811 08/04/19 1605 08/04/19 2359 08/05/19 0756  GLUCAP 72 81 89 107* 60*   Lipid Profile: No results for input(s): CHOL, HDL, LDLCALC, TRIG, CHOLHDL, LDLDIRECT in the last 72 hours. Thyroid Function Tests: No results for input(s): TSH,  T4TOTAL, FREET4, T3FREE, THYROIDAB in the last 72 hours. Anemia Panel: No results for input(s): VITAMINB12, FOLATE, FERRITIN, TIBC, IRON, RETICCTPCT in the last 72 hours. Urine analysis:    Component Value Date/Time   COLORURINE YELLOW (A) 07/24/2019 1856   APPEARANCEUR CLOUDY (A) 07/24/2019 1856   APPEARANCEUR Cloudy (A) 04/25/2015 1449   LABSPEC 1.021 07/24/2019 1856   LABSPEC 1.021 01/17/2014 1524   PHURINE 5.0 07/24/2019 1856   GLUCOSEU NEGATIVE 07/24/2019 1856   GLUCOSEU Negative 01/17/2014 1524   HGBUR MODERATE (A) 07/24/2019 1856   BILIRUBINUR NEGATIVE 07/24/2019 1856   BILIRUBINUR neg 06/30/2016  [place] Negative 04/25/2015 1449   BILIRUBINUR Negative 01/17/2014 1524   KETONESUR 5 (A) 07/24/2019 1856   PROTEINUR 100 (A) 07/24/2019 1856   UROBILINOGEN 0.2 06/30/2016 1619   NITRITE NEGATIVE 07/24/2019 1856   LEUKOCYTESUR LARGE (A) 07/24/2019 1856   LEUKOCYTESUR 2+ 01/17/2014 1524   Sepsis Labs: @LABRCNTIP (procalcitonin:4,lacticidven:4)  ) No results found for this or any previous visit (from the past 240 hour(s)).       Radiology Studies: No results found.      Scheduled Meds: . Chlorhexidine Gluconate Cloth  6 each Topical Daily  . feeding supplement (ENSURE ENLIVE)  237 mL Oral BID BM  . lactulose  20 g Oral Daily  . lisinopril  2.5 mg Oral Daily  . megestrol  400 mg Oral BID   Continuous Infusions: . dextrose 5 % and 0.45% NaCl 50 mL/hr at 08/04/19 1742     LOS: 12 days   The patient is critically ill with multiple organ systems failure and requires high complexity decision making for assessment and support, frequent evaluation and titration of therapies, application of advanced monitoring technologies and extensive interpretation of multiple databases. Critical Care Time devoted to patient care services described in this note  Time spent: 40 minutes     Yahira Timberman, Geraldo Docker, MD [facility] Pager [phone]  If 7PM-7AM, please  contact night-coverage [email or web address] Password [facility] 08/05/2019, 9:37 AM

## 2019-08-06 LAB — GLUCOSE, CAPILLARY
Glucose-Capillary: 80 mg/dL (ref 70–99)
Glucose-Capillary: 115 mg/dL — ABNORMAL HIGH (ref 70–99)

## 2019-08-06 NOTE — Progress Notes (Signed)
PROGRESS NOTE    [name] [name]  [id number] DOB: 02-07-1941 DOA: 07/24/2019 PCP: Jerrol Banana., MD   Brief Narrative:  79 year old BF PMHx DM type II, HTN, also Hypotension on midodrine, HLD, 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  Found to have recurrent UTI. Currently further plan is continue to engage with family regarding goals of care.   Subjective: 5/31 A/O x2 (does not know when, why).  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 -Recently hospitalized from 07/01/2019 to 07/12/2019 with sepsis secondary to UTI -Foley was changed on 07/24/2019 at urology office  Urethral stricture -Patient has known urethral stricture followed by urology and has indwelling Foley catheter, last seen 07/24/19 -Foley catheter change in the outpatient on the morning of arrival on 07/24/2019.  AKI (acute kidney injury)  ([place])  (baseline Cr 1.44 Recent Labs  Lab 08/01/19 0521 08/02/19 0547  CREATININE 0.63 0.79  -Resolved with hydration  Acute metabolic encephalopathy/Dementia -Unsure of patient's baseline although BP stabilized cognition has not significantly improved.  Syncope with fall -Patient had multiple reasons for syncope and fall to include sepsis, orthostatic hypotension, autonomic dysfunction. -Correct underlying dysfunction -CT head negative for acute disease -Focus on comfort care  Orthostatic hypotension/supine hypertension --BP 60/30 prior to arrival but BP very elevated after fluid bolus, as high as 197/91 --Evaluated by neurology in Jan 2021 for dizziness and syncope with consideration for starting florinef for autonomic hypotension --continue to monitor closely --fall precautions, compressive stockings -Continues to have orthostatic hypotension and supine hypertension. -Midodrine discontinued. -5/28 BP trending up lisinopril 2.5 mg  Diabetes mellitus uncontrolled with hypoglycemia associated with no complication ([place]) -Monitor for now.  Every 4 hours CBG.  No insulin. -5/27 D5-0.45% saline 68ml/hr.  Patient not eating  Dysphagia./Large hiatal hernia. Likely secondary from progression of her dementia, her lethargy with ongoing UTI as well as combination of large hiatal hernia. Currently on dysphagia 1 diet per speech therapy.  (Not eating) Focus on comfort care  History of breast cancer -Continue letrozole   Goals of care discussion. Had a extensive Discussion with patient's husband at bedside and son on the phone. Offered analysis of patient's current presentation with dysphagia, lethargy, poor p.o. intake and dementia with acute delirium. Explained how  recent septic bacteremia admission and its effect on patient's ongoing prognosis and current presentation. With patient's comorbidities and poor resilience patient's prognosis remains guarded. Family currently  wants to continue current care but would let us know if  they decide to change goals of care to focus more on comfort.  Still remains with poor p.o. intake.  Family has not made any decisions.  Palliative care consulted for further assistance.  Patient's prognosis remains poor. Unable to reach the family.   Stage II decubitus ulcer Pressure Injury 07/25/19 Buttocks Medial Stage 2 -  Partial thickness loss of dermis presenting as a shallow open injury with a red, pink wound bed without slough. (Active)  07/25/19 0100  Location: Buttocks  Location Orientation: Medial  Staging: Stage 2 -  Partial thickness loss of dermis presenting as a shallow open injury with a red, pink wound bed without slough.  Wound Description (Comments):   Present on Admission: Yes        DVT prophylaxis:  Code Status: DNR Family Communication:  Disposition Plan:  Status is: Inpatient    Dispo: The patient is from: Home              Anticipated d/c is to: Hospice              Anticipated d/c date is: Awaiting insurance authorization for residential hospice              Patient currently unstable      Consultants:  Palliative care   Procedures/Significant Events:     I have personally reviewed and interpreted all radiology studies and my findings are as above.  VENTILATOR SETTINGS:    Cultures 5/18 blood LEFT AC negative final 5/18 blood RIGHT AC negative final  5/18 SARS coronavirus negative 5/19 MRSA by PCR negative   Antimicrobials: Anti-infectives (From admission, onward)   Start     Dose/Rate Stop   07/27/19 1400  cephALEXin (KEFLEX) capsule 500 mg  Status:  Discontinued     500 mg 07/27/19 1323   07/27/19 1400  cefdinir (OMNICEF) capsule 300 mg  Status:  Discontinued     300 mg 07/31/19 0745   07/26/19 1400  ceFAZolin (ANCEF) IVPB 1 g/50 mL premix  Status:  Discontinued     1 g 100 mL/hr over 30 Minutes 07/27/19 0949   07/26/19 0815  cephALEXin (KEFLEX) capsule 500 mg   Status:  Discontinued     500 mg 07/26/19 1325   07/25/19 1500  ceFEPIme (MAXIPIME) 2 g in sodium chloride 0.9 % 100 mL IVPB  Status:  Discontinued     2 g 200 mL/hr over 30 Minutes 07/26/19 0812   07/24/19 1930  ceFEPIme (MAXIPIME) 2 g in sodium chloride 0.9 % 100 mL IVPB  Status:  Discontinued     2 g 200 mL/hr over 30 Minutes 07/24/19 1930   07/24/19 1430  ceFEPIme (MAXIPIME) 2 g in sodium chloride 0.9 % 100 mL IVPB     2 g 200 mL/hr over 30 Minutes 07/24/19 1605       Devices    LINES / TUBES:  Foley catheter 5/18>>>    Continuous Infusions: . dextrose 5 % and 0.45% NaCl 50 mL/hr at 08/05/19 2300     Objective: Vitals:   08/05/19 0529 08/05/19 1137 08/05/19 2056 08/06/19 0429  BP: 135/80 133/63 (!) 156/85 (!) 133/91  Pulse: 76 78 90 80  Resp: 18 20 16 20   Temp: 98.6 F (37 C) 98.1 F (36.7 C) 98.3 F (36.8 C) 98.2 F (36.8 C)  TempSrc: Oral Oral Oral Oral  SpO2: 100%  100% 100%  Weight:      Height:        Intake/Output Summary (Last  24 hours) at 08/06/2019 [id number] Last data filed at 08/06/2019 0557 Gross per 24 hour  Intake 1584.5 ml  Output 1050 ml  Net 534.5 ml   Filed Weights   07/24/19 1244  Weight: 61.2 kg    Physical Exam:  General: A/O x1 (does not know why) no acute respiratory distress, cachectic Eyes: negative scleral hemorrhage, negative anisocoria, negative icterus ENT: Negative Runny nose, negative gingival bleeding, Neck:  Negative scars, masses, torticollis, lymphadenopathy, JVD Lungs: Clear to auscultation bilaterally without wheezes or crackles Cardiovascular: Regular rate and rhythm without murmur gallop or rub normal S1 and S2 Abdomen: negative abdominal pain, nondistended, positive soft, bowel sounds, no rebound, no ascites, no appreciable mass Extremities: No significant cyanosis, clubbing, or edema bilateral lower extremities Skin: Negative rashes, lesions, ulcers Psychiatric:  Negative depression, negative anxiety, negative  fatigue, negative mania  Central nervous system:  Cranial nerves II through XII intact, tongue/uvula midline, all extremities muscle strength 5/5, sensation intact throughout, negative dysarthria, negative expressive aphasia, negative receptive aphasia.  .     Data Reviewed: Care during the described time interval was provided by me .  I have reviewed this patient's available data, including medical history, events of note, physical examination, and all test results as part of my evaluation.   CBC: Recent Labs  Lab 08/01/19 0521 08/02/19 0547  WBC 8.4 7.3  HGB 9.9* 9.8*  HCT 31.2* 30.1*  MCV 88.9 87.0  PLT 450* 123456*   Basic Metabolic Panel: Recent Labs  Lab 08/01/19 0521 08/02/19 0547  NA 138 140  K 3.9 3.7  CL 104 107  CO2 28 27  GLUCOSE 104* 103*  BUN 13 15  CREATININE 0.63 0.79  CALCIUM 8.7* 8.5*  MG  --  1.7  PHOS  --  3.3   GFR: Estimated Creatinine Clearance: 50.5 mL/min (by C-G formula based on SCr of 0.79 mg/dL). Liver Function Tests: Recent Labs  Lab 08/02/19 0547  AST 19  ALT 12  ALKPHOS 43  BILITOT 0.6  PROT 5.7*  ALBUMIN 2.5*   No results for input(s): LIPASE, AMYLASE in the last 168 hours. No results for input(s): AMMONIA in the last 168 hours. Coagulation Profile: No results for input(s): INR, PROTIME in the last 168 hours. Cardiac Enzymes: No results for input(s): CKTOTAL, CKMB, CKMBINDEX, TROPONINI in the last 168 hours. BNP (last 3 results) No results for input(s): PROBNP in the last 8760 hours. HbA1C: No results for input(s): HGBA1C in the last 72 hours. CBG: Recent Labs  Lab 08/05/19 0756 08/05/19 1134 08/05/19 1625 08/05/19 2340 08/06/19 0811  GLUCAP 60* 120* 98 90 80   Lipid Profile: No results for input(s): CHOL, HDL, LDLCALC, TRIG, CHOLHDL, LDLDIRECT in the last 72 hours. Thyroid Function Tests: No results for input(s): TSH, T4TOTAL, FREET4, T3FREE, THYROIDAB in the last 72 hours. Anemia Panel: No results for input(s):  VITAMINB12, FOLATE, FERRITIN, TIBC, IRON, RETICCTPCT in the last 72 hours. Urine analysis:    Component Value Date/Time   COLORURINE YELLOW (A) 07/24/2019 1856   APPEARANCEUR CLOUDY (A) 07/24/2019 1856   APPEARANCEUR Cloudy (A) 04/25/2015 1449   LABSPEC 1.021 07/24/2019 1856   LABSPEC 1.021 01/17/2014 1524   PHURINE 5.0 07/24/2019 1856   GLUCOSEU NEGATIVE 07/24/2019 1856   GLUCOSEU Negative 01/17/2014 1524   HGBUR MODERATE (A) 07/24/2019 1856   BILIRUBINUR NEGATIVE 07/24/2019 1856   BILIRUBINUR neg 06/30/2016 1619   BILIRUBINUR Negative 04/25/2015 1449   BILIRUBINUR Negative 01/17/2014 1524   KETONESUR 5 (A) 07/24/2019 1856  PROTEINUR 100 (A) 07/24/2019 1856   UROBILINOGEN 0.2 06/30/2016 1619   NITRITE NEGATIVE 07/24/2019 1856   LEUKOCYTESUR LARGE (A) 07/24/2019 1856   LEUKOCYTESUR 2+ 01/17/2014 1524   Sepsis Labs: @LABRCNTIP (procalcitonin:4,lacticidven:4)  ) No results found for this or any previous visit (from the past 240 hour(s)).       Radiology Studies: No results found.      Scheduled Meds: . Chlorhexidine Gluconate Cloth  6 each Topical Daily  . feeding supplement (ENSURE ENLIVE)  237 mL Oral BID BM  . lactulose  20 g Oral Daily  . lisinopril  2.5 mg Oral Daily  . megestrol  400 mg Oral BID   Continuous Infusions: . dextrose 5 % and 0.45% NaCl 50 mL/hr at 08/05/19 2300     LOS: 13 days   The patient is critically ill with multiple organ systems failure and requires high complexity decision making for assessment and support, frequent evaluation and titration of therapies, application of advanced monitoring technologies and extensive interpretation of multiple databases. Critical Care Time devoted to patient care services described in this note  Time spent: 40 minutes     Glee Lashomb, Geraldo Docker, MD [facility] Pager [phone]  If 7PM-7AM, please contact night-coverage [email or web address] Password [email or web address] 08/06/2019, 9:22 AM

## 2019-08-06 NOTE — TOC Progression Note (Signed)
Transition of Care [facility]) - Progression Note    Patient Details  Name: [name] [name] MRN: [id number] Date of Birth: 18-Jul-1940  Transition of Care [facility]) CM/SW Contact  Eileen Stanford, LCSW Phone Number: 08/06/2019, 10:37 AM  Clinical Narrative:   No beds available at [facility] today, CSW will follow up tomorrow.    Expected Discharge Plan: Skilled Nursing Facility Barriers to Discharge: Continued Medical Work up  Expected Discharge Plan and Services Expected Discharge Plan: [place] arrangements for the past 2 months: [place]                                       Social Determinants of Health (SDOH) Interventions    Readmission Risk Interventions Readmission Risk Prevention Plan 07/25/2019 07/04/2019  Transportation Screening Complete Complete  PCP or Specialist Appt within 3-5 Days - Complete  HRI or [place] - Complete  Medication Review (RN Care Manager) Complete Referral to Pharmacy  Palliative Care Screening Not Applicable -  [place] Complete -  Some recent data might be hidden

## 2019-08-06 NOTE — Care Management Important Message (Signed)
Important Message  Patient Details  Name: [name] [name] MRN: [id number] Date of Birth: 1940/12/22   Medicare Important Message Given:  Yes     Dannette Barbara 08/06/2019, 10:45 AM

## 2019-08-07 DIAGNOSIS — W19XXXA Unspecified fall, initial encounter: Secondary | ICD-10-CM

## 2019-08-07 DIAGNOSIS — T83511A Infection and inflammatory reaction due to indwelling urethral catheter, initial encounter: Principal | ICD-10-CM

## 2019-08-07 DIAGNOSIS — N390 Urinary tract infection, site not specified: Secondary | ICD-10-CM

## 2019-08-07 DIAGNOSIS — N9912 Postprocedural urethral stricture, female: Secondary | ICD-10-CM

## 2019-08-07 LAB — GLUCOSE, CAPILLARY
Glucose-Capillary: 65 mg/dL — ABNORMAL LOW (ref 70–99)
Glucose-Capillary: 89 mg/dL (ref 70–99)
Glucose-Capillary: 119 mg/dL — ABNORMAL HIGH (ref 70–99)
Glucose-Capillary: 82 mg/dL (ref 70–99)

## 2019-08-07 MED ORDER — ACETAMINOPHEN 650 MG RE SUPP: 650.0000 mg | Freq: Four times a day (QID) | RECTAL | 0 refills | Status: AC | PRN

## 2019-08-07 MED ORDER — ONDANSETRON HCL 4 MG PO TABS: 4.0000 mg | ORAL_TABLET | Freq: Four times a day (QID) | ORAL | 0 refills | Status: AC | PRN

## 2019-08-07 MED ORDER — MORPHINE SULFATE (CONCENTRATE) 10 MG/0.5ML PO SOLN: 2.5000 mg | ORAL | 0 refills | Status: AC | PRN

## 2019-08-07 MED ORDER — LISINOPRIL 2.5 MG PO TABS: 2.5000 mg | ORAL_TABLET | Freq: Every day | ORAL | 0 refills | Status: AC

## 2019-08-07 NOTE — Progress Notes (Addendum)
[facility]) Hospital Liaison RN note  [place] does have the ability to offer a room for this patient today.  Consents are completed.  EMS has been called for 1530 pickup.  Discharge summary has been sent.  RN please call report to [phone]  Thank you, Margaretmary Eddy, BSN, RN [facility] Liaison [phone]

## 2019-08-07 NOTE — Discharge Summary (Signed)
Physician Discharge Summary  [name] [name] [id number] DOB: 05-03-1940 DOA: 07/24/2019  PCP: Jerrol Banana., MD  Admit date: 07/24/2019 Discharge date: 08/07/2019  Time spent: 30 minutes  Severe sepsis (HCC)/recurrent UTI sepsis -All cultures negative, lactic acid negative procalcitonin negative all antibiotics discontinued -Per EMR patient did have low-grade fever tachycardia lactic acidosis which was treated with hydration and initially patient was started on antibiotics. -Recently hospitalized from 07/01/2019 to 07/12/2019 with sepsis secondary to UTI -Foley was changed on 07/24/2019 at urology office  Urethral stricture -Patient has known urethral stricture followed by urology and has indwelling Foley catheter, last seen 07/24/19 -Foley catheter change in the outpatient on the morning of arrival on 07/24/2019.  AKI (acute kidney injury) ([place]) (baseline Cr 1.44 Recent Labs  Lab 08/01/19 0521 08/02/19 0547  CREATININE 0.63 0.79  -Resolved with hydration  Acute metabolic encephalopathy/Dementia -Unsure of patient's baseline although BP stabilized cognition has not significantly improved.  Syncope with fall -Patient had multiple reasons for syncope and fall to include sepsis, orthostatic hypotension, autonomic dysfunction. -Correct underlying dysfunction -CT head negative for acute disease -Focus on comfort care  Orthostatic hypotension/supine hypertension --BP 60/30 prior to arrival but BP very elevated after fluid bolus, as high as 197/91 --Evaluated by neurology in Jan 2021 for dizziness and syncope with consideration for starting florinef for autonomic hypotension --continue to monitor closely --fall precautions, compressive stockings -Continues to have orthostatic hypotension and supine hypertension. -Midodrine discontinued. -5/28 BP trending up lisinopril 2.5 mg  Diabetes mellitus uncontrolled with hypoglycemia associated with no complication  ([place]) -Focus on comfort care  Dysphagia./Large hiatal hernia. Likely secondary from progression of her dementia, her lethargy with ongoing UTI as well as combination of large hiatal hernia. Currently on dysphagia 1 diet per speech therapy.  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 ([place])   Acute metabolic encephalopathy   Urethral stricture   Chronic indwelling Foley catheter   Orthostatic hypotension   Pressure injury of skin   Cystitis   Palliative care by specialist   Goals of care, counseling/discussion   Dysphagia   Hypoglycemia   Discharge Condition: Guarded Diet recommendation: Dysphagia 1  Filed Weights   07/24/19 1244  Weight: 61.2 kg    History of present illness:  79 year old BF PMHx DM type II, HTN, also Hypotension on midodrine, HLD, 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 Found to have recurrent UTI. Currently further plan is continueto engage with family regarding goals of care.  Hospital Course:  See above   Consultations: Palliative care   Cultures  5/18 blood LEFT AC negative final 5/18 blood RIGHT AC negative final  5/18 SARS coronavirus negative 5/19 MRSA by PCR negative  Antibiotics Anti-infectives (From admission, onward)   Start     Dose/Rate Stop   07/27/19 1400  cephALEXin (KEFLEX) capsule 500 mg  Status:  Discontinued     500 mg 07/27/19 1323   07/27/19 1400  cefdinir (OMNICEF)  capsule 300 mg  Status:  Discontinued     300 mg 07/31/19 0745   07/26/19 1400  ceFAZolin (ANCEF) IVPB 1 g/50 mL premix  Status:  Discontinued     1 g 100 mL/hr over 30 Minutes 07/27/19 0949   07/26/19 0815  cephALEXin (KEFLEX) capsule 500 mg  Status:  Discontinued     500 mg 07/26/19 1325   07/25/19 1500  ceFEPIme (MAXIPIME) 2 g in sodium chloride 0.9 % 100 mL IVPB  Status:  Discontinued     2 g 200 mL/hr over 30 Minutes 07/26/19 0812   07/24/19 1930  ceFEPIme (MAXIPIME) 2 g in sodium chloride 0.9 % 100 mL IVPB  Status:  Discontinued     2 g 200 mL/hr over 30 Minutes 07/24/19 1930   07/24/19 1430  ceFEPIme (MAXIPIME) 2 g in sodium chloride 0.9 % 100 mL IVPB     2 g 200 mL/hr over 30 Minutes 07/24/19 1605       Discharge Exam: Vitals:   08/06/19 0429 08/06/19 1301 08/06/19 2027 08/07/19 0430  BP: (!) 133/91 139/67 139/70 137/66  Pulse: 80 69 83 84  Resp: 20 15 16 20   Temp: 98.2 F (36.8 C) 98.2 F (36.8 C) 97.9 F (36.6 C) 98.4 F (36.9 C)  TempSrc: Oral Oral Oral Oral  SpO2: 100% 100% 100% 100%  Weight:      Height:        General: A/O x1 (does not know why) no acute respiratory distress, cachectic Eyes: negative scleral hemorrhage, negative anisocoria, negative icterus ENT: Negative Runny nose, negative gingival bleeding, Neck:  Negative scars, masses, torticollis, lymphadenopathy, JVD Lungs: Clear to auscultation bilaterally without wheezes or crackles Cardiovascular: Regular rate and rhythm without murmur gallop or rub normal S1 and S2   Discharge Instructions   Allergies as of 08/07/2019      Reactions   Celebrex  [celecoxib]    Dark stools.   Penicillins Other (See Comments)   Childhood reaction Has patient had a PCN reaction causing immediate rash, facial/tongue/throat swelling, SOB or lightheadedness with hypotension: Unknown Has patient had a PCN reaction causing severe rash involving mucus membranes or skin necrosis: Unknown Has patient had a PCN  reaction that required hospitalization: Unknown Has patient had a PCN reaction occurring within the last 10 years: Unknown If all of the above answers are "NO", then may proceed with Cephalosporin use.   Prevacid [lansoprazole] Diarrhea      Medication List    STOP taking these medications   Calcium 500+D 500-200 MG-UNIT Tabs Generic drug: Calcium Carb-Cholecalciferol   haloperidol 0.5 MG tablet Commonly known as: HALDOL   hydrALAZINE 25 MG tablet Commonly known as: APRESOLINE   letrozole 2.5 MG tablet Commonly known as: FEMARA   midodrine 10 MG tablet Commonly known as: PROAMATINE   senna-docusate 8.6-50 MG tablet Commonly known as: Senokot-S     TAKE these medications   acetaminophen 650 MG CR tablet Commonly known as: TYLENOL Take 1,300 mg by mouth every 8 (eight) hours as needed for pain. What changed: Another medication with the same name was added. Make sure you understand how and when to take each.   acetaminophen 650 MG suppository Commonly known as: TYLENOL Place 1 suppository (650 mg total) rectally every 6 (six) hours as needed for mild pain (or Fever >/= 101). What changed: You were already taking a medication with the same name, and this prescription was added. Make sure you understand how and when to take each.   lisinopril 2.5 MG tablet  Commonly known as: ZESTRIL Take 1 tablet (2.5 mg total) by mouth daily. Start taking on: August 08, 2019   morphine CONCENTRATE 10 MG/0.5ML Soln concentrated solution Place 0.13 mLs (2.6 mg total) under the tongue every 4 (four) hours as needed for moderate pain or shortness of breath.   omeprazole 20 MG capsule Commonly known as: PRILOSEC TAKE 1 CAPSULE (20 MG TOTAL) BY MOUTH DAILY AS NEEDED (INDIGESTION). What changed: See the new instructions.   ondansetron 4 MG tablet Commonly known as: ZOFRAN Take 1 tablet (4 mg total) by mouth every 6 (six) hours as needed for nausea.      Allergies  Allergen Reactions  .  Celebrex  [Celecoxib]     Dark stools.  . Penicillins Other (See Comments)    Childhood reaction Has patient had a PCN reaction causing immediate rash, facial/tongue/throat swelling, SOB or lightheadedness with hypotension: Unknown Has patient had a PCN reaction causing severe rash involving mucus membranes or skin necrosis: Unknown Has patient had a PCN reaction that required hospitalization: Unknown Has patient had a PCN reaction occurring within the last 10 years: Unknown If all of the above answers are "NO", then may proceed with Cephalosporin use.   Marland Kitchen Prevacid [Lansoprazole] Diarrhea      The results of significant diagnostics from this hospitalization (including imaging, microbiology, ancillary and laboratory) are listed below for reference.    Significant Diagnostic Studies: DG Thoracic Spine 2 View  Result Date: 07/29/2019 CLINICAL DATA:  Back pain after fall EXAM: THORACIC SPINE 2 VIEWS COMPARISON:  None. FINDINGS: No evidence of acute fracture or traumatic malalignment. There is a large left base opacity that likely reflects the patient's large hiatal hernia. Generalized disc narrowing in keeping with age. No focal osseous finding IMPRESSION: No evidence of acute thoracic spine injury. Electronically Signed   By: Monte Fantasia M.D.   On: 07/29/2019 13:28   DG Lumbar Spine 2-3 Views  Result Date: 07/29/2019 CLINICAL DATA:  Fall 07/25/2019.  Found on floor. EXAM: LUMBAR SPINE - 2-3 VIEW COMPARISON:  None. FINDINGS: Advanced lumbar spine degeneration with diffuse disc collapse and endplate spurring. There is also facet osteoarthritis. Moderate lumbar levoscoliosis. No specific posttraumatic finding. Generalized osteopenia and extensive arterial calcification. IMPRESSION: 1. Advanced degenerative disease with scoliosis. 2. No acute osseous finding. Electronically Signed   By: Monte Fantasia M.D.   On: 07/29/2019 13:27   DG Sacrum/Coccyx  Result Date: 07/29/2019 CLINICAL DATA:   Found on floor. EXAM: SACRUM AND COCCYX - 2+ VIEW COMPARISON:  Renal stone CT 06/12/2019 FINDINGS: No visible sacral fracture or sacroiliac diastasis. There is limitation by osteopenia, rotation, and stool. IMPRESSION: Limited study without acute finding. Electronically Signed   By: Monte Fantasia M.D.   On: 07/29/2019 13:21   CT HEAD WO CONTRAST  Result Date: 07/26/2019 CLINICAL DATA:  Encephalopathy EXAM: CT HEAD WITHOUT CONTRAST TECHNIQUE: Contiguous axial images were obtained from the base of the skull through the vertex without intravenous contrast. COMPARISON:  None. FINDINGS: Brain: No evidence of acute infarction, hemorrhage, hydrocephalus, extra-axial collection or mass lesion/mass effect. There is mild cerebral volume loss with associated ex vacuo dilatation. Periventricular white matter hypoattenuation likely represents chronic small vessel ischemic disease. Vascular: There are vascular calcifications in the carotid siphons. Skull: Normal. Negative for fracture or focal lesion. Sinuses/Orbits: No acute finding. Other: None. IMPRESSION: No acute intracranial process. Electronically Signed   By: Zerita Boers M.D.   On: 07/26/2019 14:13   CT Head Wo Contrast  Result Date:  07/24/2019 CLINICAL DATA:  Encephalopathy. EXAM: CT HEAD WITHOUT CONTRAST TECHNIQUE: Contiguous axial images were obtained from the base of the skull through the vertex without intravenous contrast. COMPARISON:  Noncontrast head CT 07/01/2019, brain MRI 05/25/2019 FINDINGS: Brain: Stable mild ill-defined hypoattenuation within cerebral white matter which is nonspecific, but consistent with chronic small vessel ischemic disease. Stable, mild generalized parenchymal atrophy. Redemonstrated chronic lacunar infarct within the right caudate head (series 3, image 15) There is no acute intracranial hemorrhage. No demarcated cortical infarct. No extra-axial fluid collection. No evidence of intracranial mass. No midline shift. Vascular: No  hyperdense vessel.  Atherosclerotic calcifications Skull: Normal. Negative for fracture or focal lesion. Sinuses/Orbits: Visualized orbits show no acute finding. No significant paranasal sinus disease or mastoid effusion at the imaged levels. IMPRESSION: 1. No evidence of acute intracranial abnormality. 2. Stable mild generalized parenchymal atrophy and chronic small vessel ischemic disease. 3. Redemonstrated chronic right basal ganglia lacunar infarct. Electronically Signed   By: Kellie Simmering DO   On: 07/24/2019 14:37   DG Chest Portable 1 View  Result Date: 07/24/2019 CLINICAL DATA:  Altered mental status. EXAM: PORTABLE CHEST 1 VIEW COMPARISON:  Chest radiograph 07/01/2019. Chest CT 03/31/2018 FINDINGS: Lower lung volumes from prior exam. Heart size is normal for technique. Aortic atherosclerosis and tortuosity, unchanged. Hiatal hernia with air-filled stomach projecting over the left lung base, as seen on prior chest CT. No acute airspace disease. No pulmonary edema, pleural effusion, or pneumothorax. No acute osseous abnormalities are seen. IMPRESSION: 1. Lower lung volumes from prior exam. No acute chest finding. 2. Large hiatal hernia. Aortic Atherosclerosis (ICD10-I70.0). Electronically Signed   By: Keith Rake M.D.   On: 07/24/2019 14:01    Microbiology: No results found for this or any previous visit (from the past 240 hour(s)).   Labs: Basic Metabolic Panel: Recent Labs  Lab 08/01/19 0521 08/02/19 0547  NA 138 140  K 3.9 3.7  CL 104 107  CO2 28 27  GLUCOSE 104* 103*  BUN 13 15  CREATININE 0.63 0.79  CALCIUM 8.7* 8.5*  MG  --  1.7  PHOS  --  3.3   Liver Function Tests: Recent Labs  Lab 08/02/19 0547  AST 19  ALT 12  ALKPHOS 43  BILITOT 0.6  PROT 5.7*  ALBUMIN 2.5*   No results for input(s): LIPASE, AMYLASE in the last 168 hours. No results for input(s): AMMONIA in the last 168 hours. CBC: Recent Labs  Lab 08/01/19 0521 08/02/19 0547  WBC 8.4 7.3  HGB 9.9*  9.8*  HCT 31.2* 30.1*  MCV 88.9 87.0  PLT 450* 460*   Cardiac Enzymes: No results for input(s): CKTOTAL, CKMB, CKMBINDEX, TROPONINI in the last 168 hours. BNP: BNP (last 3 results) No results for input(s): BNP in the last 8760 hours.  ProBNP (last 3 results) No results for input(s): PROBNP in the last 8760 hours.  CBG: Recent Labs  Lab 08/06/19 0811 08/06/19 1649 08/07/19 0156 08/07/19 0229 08/07/19 0746  GLUCAP 80 115* 65* 89 119*       Signed:  Dia Crawford, MD [facility] [phone] pager

## 2019-08-07 NOTE — TOC Transition Note (Signed)
Transition of Care [facility]) - CM/SW Discharge Note   Patient Details  Name: CYTHIA BIGBY MRN: [id number] Date of Birth: [birth year]  [facility]) CM/SW Contact:  Beverly Sessions, RN Phone Number: 08/07/2019, 3:27 PM   Clinical Narrative:     [facility] home has bed available today.  Hospice has notified family and they have completed consents  EMS packet and signed DNR on chart Bedside RN has called report  Olivia Mackie with [employer] has arranged EMS transport  Final next level of care: [place] Barriers to Discharge: No Barriers Identified   Patient Goals and CMS Choice        Discharge Placement                Patient to be transferred to facility by: EMS Name of family member notified: Hospice has notified family    Discharge Plan and Services                                     Social Determinants of Health (SDOH) Interventions     Readmission Risk Interventions Readmission Risk Prevention Plan 07/25/2019 07/04/2019  Transportation Screening Complete Complete  PCP or Specialist Appt within 3-5 Days - Complete  HRI or [place] - Complete  Medication Review (RN Care Manager) Complete Referral to Pharmacy  Palliative Care Screening Not Applicable -  Skilled Nursing Facility Complete -  Some recent data might be hidden

## 2019-08-07 NOTE — Progress Notes (Signed)
Hypoglycemic Event  CBG:65 Treatment: 8 oz juice/soda  Symptoms: None  Follow-up CBG: Time:0230 CBG Result:89 Possible Reasons for Event: Inadequate meal intake  Comments/MD notified:na    Jaynie Crumble

## 2019-08-07 NOTE — Progress Notes (Signed)
[name] [name]  A and O x 1. VSS. Pt tolerating diet well. No complaints of pain or nausea. Pt will be DC with IV, prescriptions given to EMS.  Pt discharged via EMS to hospice home.     Allergies as of 08/07/2019      Reactions   Celebrex  [celecoxib]    Dark stools.   Penicillins Other (See Comments)   Childhood reaction Has patient had a PCN reaction causing immediate rash, facial/tongue/throat swelling, SOB or lightheadedness with hypotension: Unknown Has patient had a PCN reaction causing severe rash involving mucus membranes or skin necrosis: Unknown Has patient had a PCN reaction that required hospitalization: Unknown Has patient had a PCN reaction occurring within the last 10 years: Unknown If all of the above answers are "NO", then may proceed with Cephalosporin use.   Prevacid [lansoprazole] Diarrhea      Medication List    STOP taking these medications   Calcium 500+D 500-200 MG-UNIT Tabs Generic drug: Calcium Carb-Cholecalciferol   haloperidol 0.5 MG tablet Commonly known as: HALDOL   hydrALAZINE 25 MG tablet Commonly known as: APRESOLINE   letrozole 2.5 MG tablet Commonly known as: FEMARA   midodrine 10 MG tablet Commonly known as: PROAMATINE   senna-docusate 8.6-50 MG tablet Commonly known as: Senokot-S     TAKE these medications   acetaminophen 650 MG CR tablet Commonly known as: TYLENOL Take 1,300 mg by mouth every 8 (eight) hours as needed for pain. What changed: Another medication with the same name was added. Make sure you understand how and when to take each.   acetaminophen 650 MG suppository Commonly known as: TYLENOL Place 1 suppository (650 mg total) rectally every 6 (six) hours as needed for mild pain (or Fever >/= 101). What changed: You were already taking a medication with the same name, and this prescription was added. Make sure you understand how and when to take each.   lisinopril 2.5 MG tablet Commonly known as: ZESTRIL Take 1  tablet (2.5 mg total) by mouth daily. Start taking on: August 08, 2019   morphine CONCENTRATE 10 MG/0.5ML Soln concentrated solution Place 0.13 mLs (2.6 mg total) under the tongue every 4 (four) hours as needed for moderate pain or shortness of breath.   omeprazole 20 MG capsule Commonly known as: PRILOSEC TAKE 1 CAPSULE (20 MG TOTAL) BY MOUTH DAILY AS NEEDED (INDIGESTION). What changed: See the new instructions.   ondansetron 4 MG tablet Commonly known as: ZOFRAN Take 1 tablet (4 mg total) by mouth every 6 (six) hours as needed for nausea.       Vitals:   08/07/19 0430 08/07/19 1145  BP: 137/66 132/69  Pulse: 84 77  Resp: 20 18  Temp: 98.4 F (36.9 C) 97.7 F (36.5 C)  SpO2: 100%     Francesco Sor

## 2019-08-07 NOTE — Progress Notes (Signed)
RN called report to hospice home.

## 2019-08-27 ENCOUNTER — Ambulatory Visit: Payer: Self-pay | Admitting: Physician Assistant

## 2019-09-06 DEATH — deceased

## 2019-11-30 ENCOUNTER — Ambulatory Visit: Payer: PPO | Admitting: Oncology

## 2019-12-19 ENCOUNTER — Ambulatory Visit: Payer: PPO | Admitting: Radiation Oncology

## 2022-02-13 IMAGING — MG DIGITAL DIAGNOSTIC BILAT W/ TOMO W/ CAD
9 series · 9 of 25 positions shown · non-contrast
Comparison: Previous exam(s).

CLINICAL DATA: Right lumpectomy.  Annual mammography.

EXAM:
DIGITAL DIAGNOSTIC BILATERAL MAMMOGRAM WITH CAD AND TOMO

[R MLO]
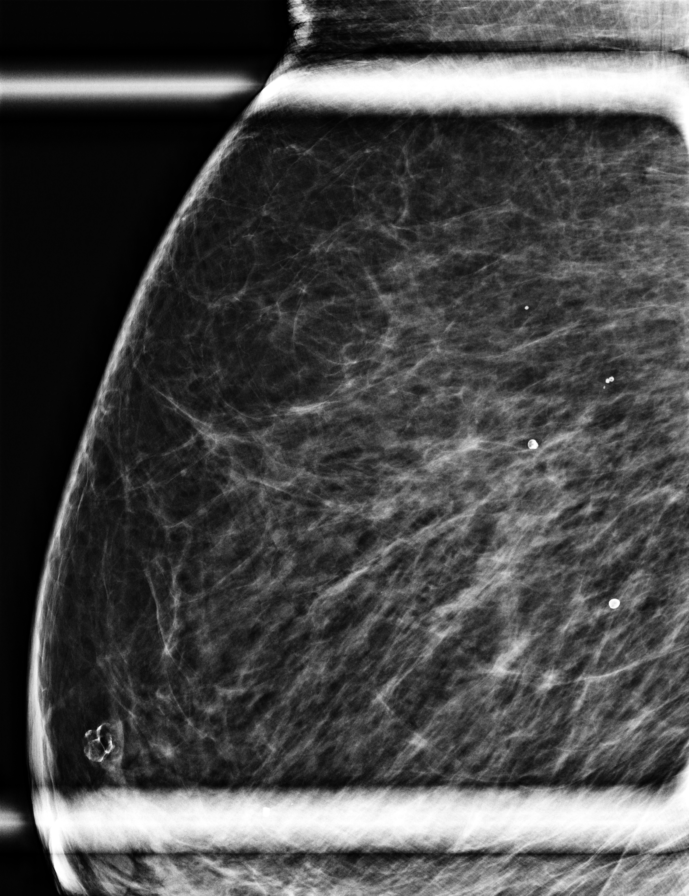

[L CC synth-2D]
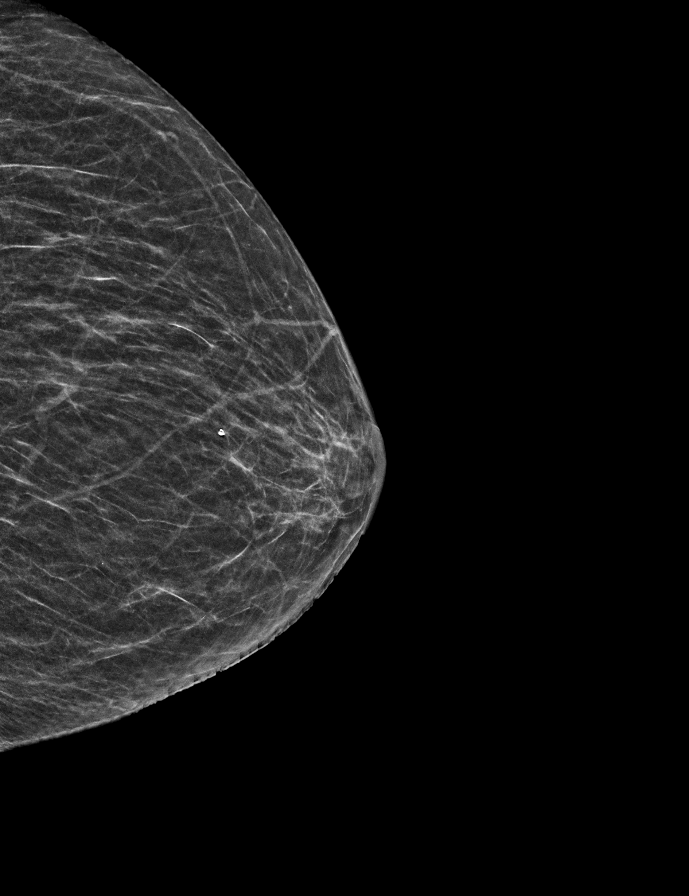

[L MLO synth-2D]
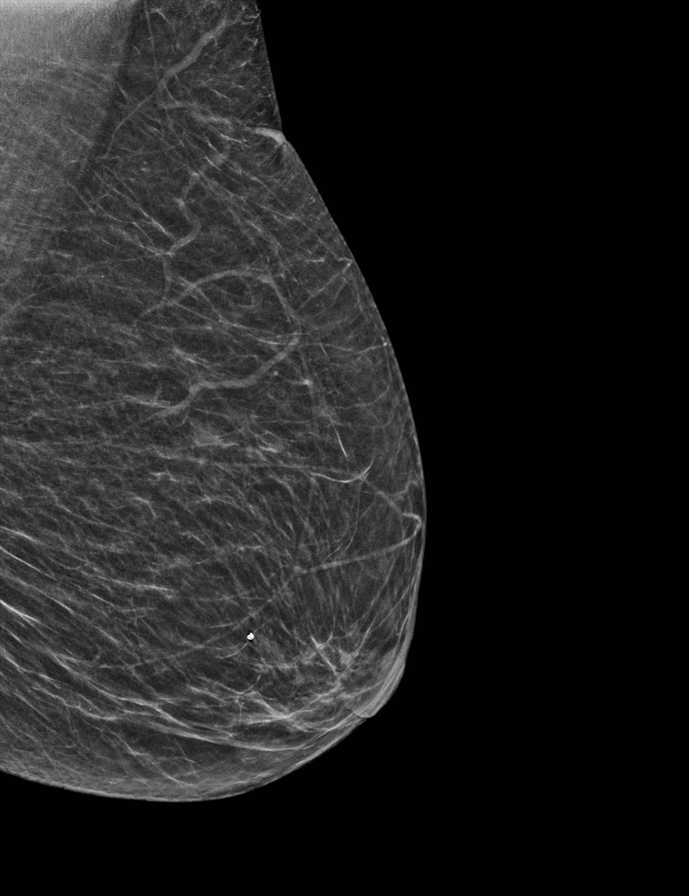

[R CC synth-2D]
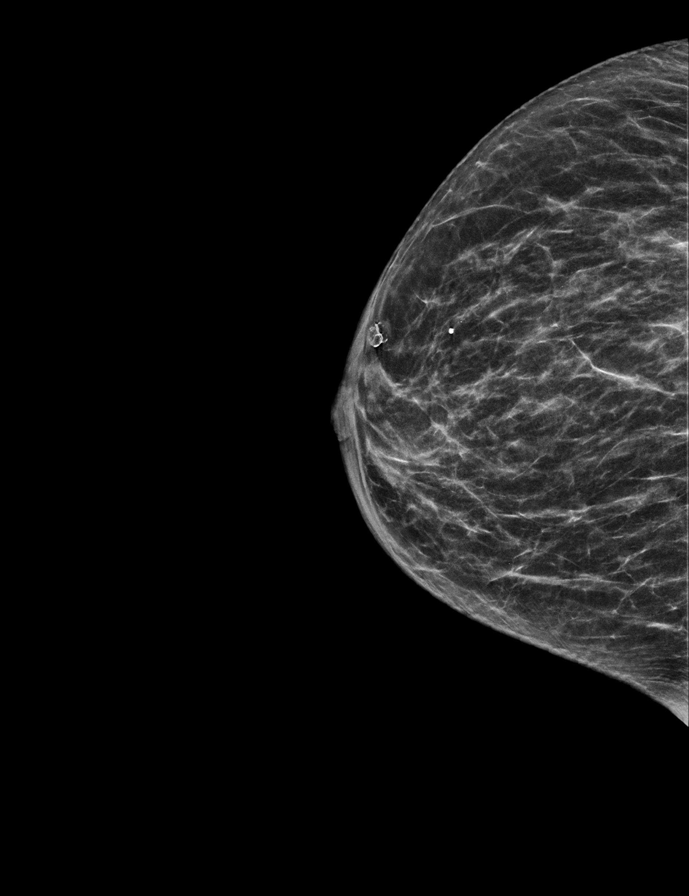

[R MLO synth-2D]
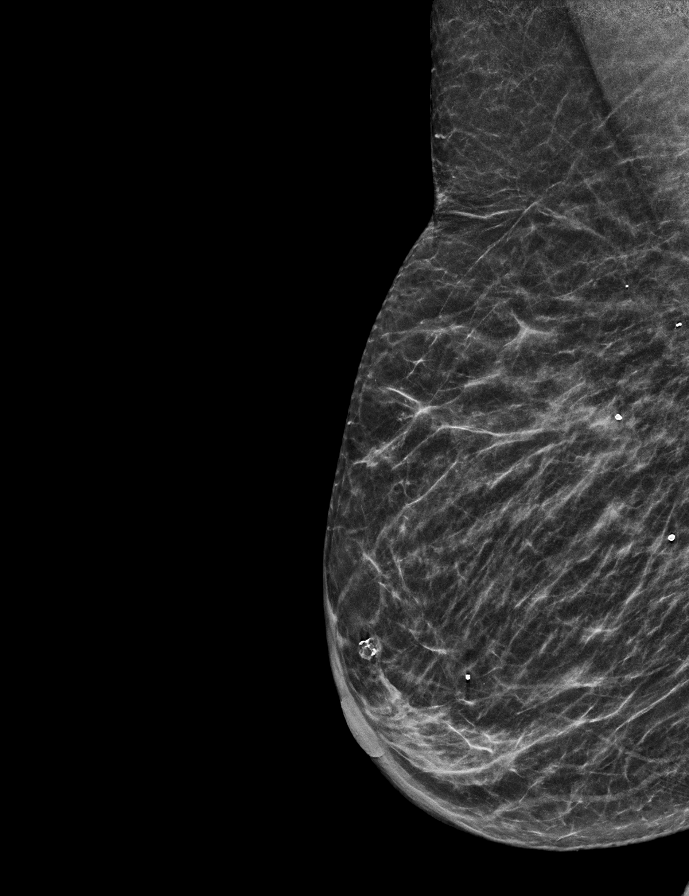

[R CC tomo · tomo slice 25/48.0]
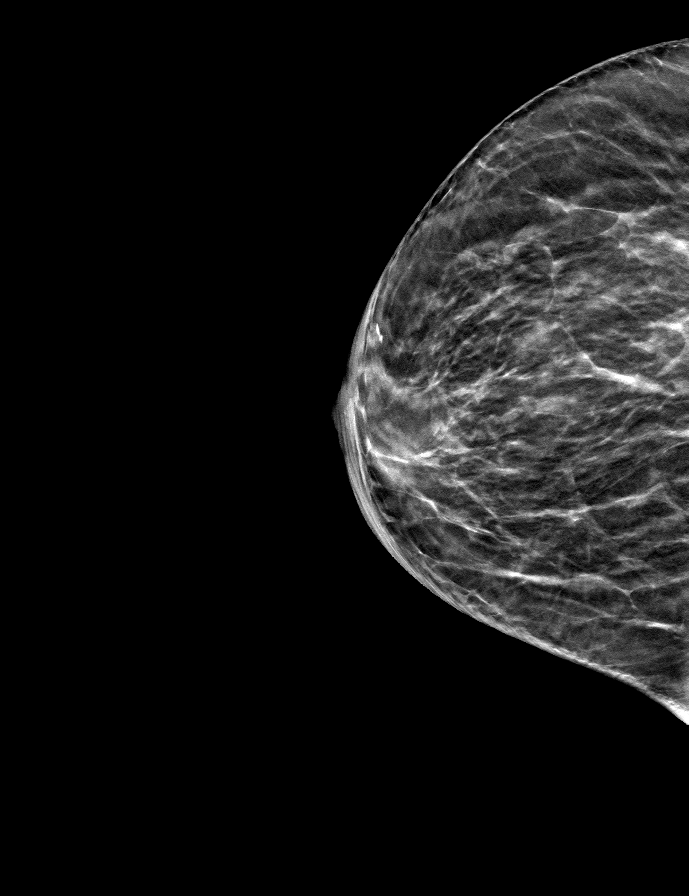

[L MLO tomo · tomo slice 21/40.0]
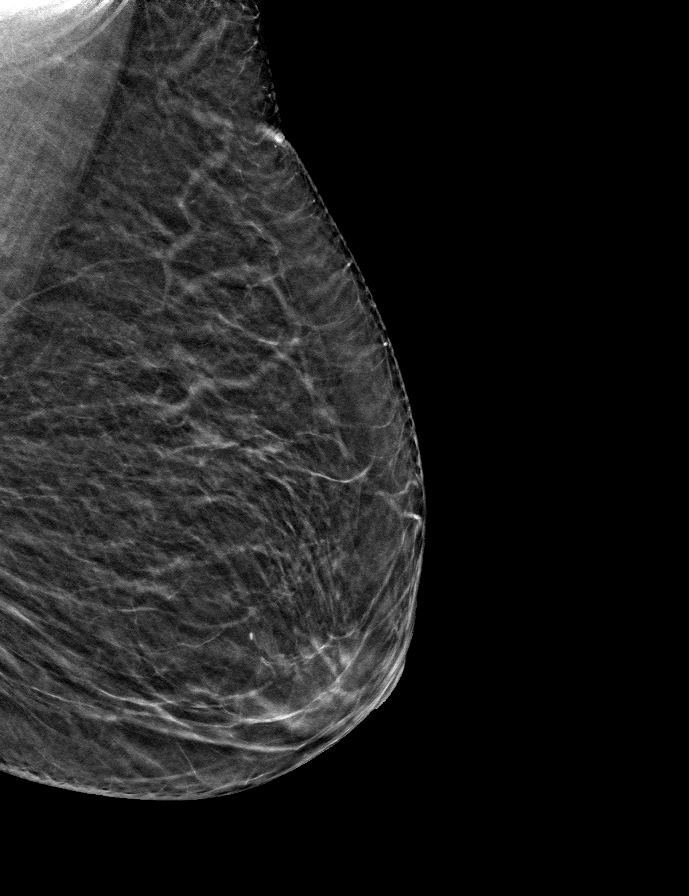

[L CC tomo · tomo slice 18/35.0]
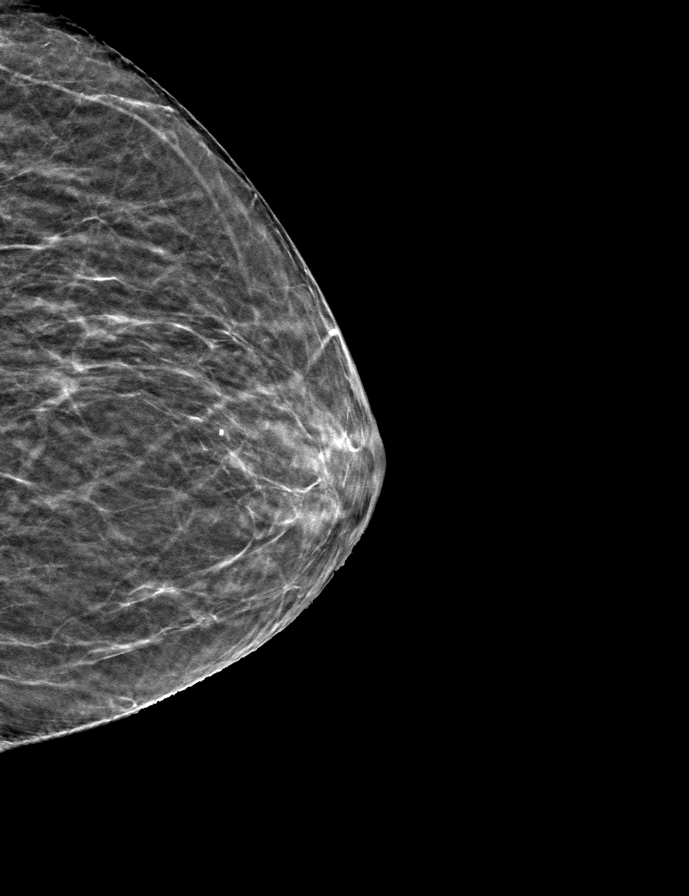

[R MLO tomo · tomo slice 24/47.0]
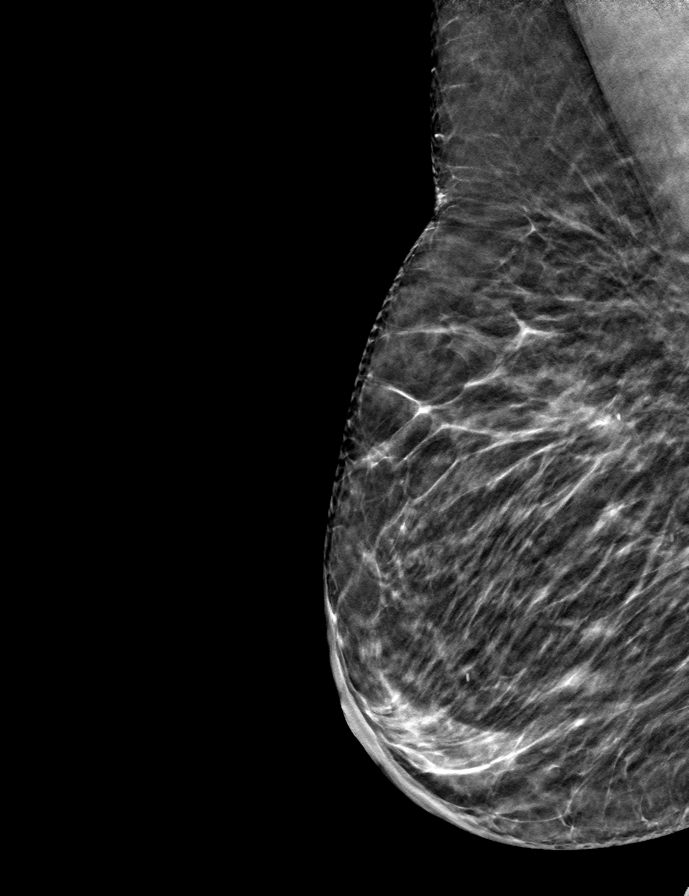

[9 of 25 positions shown; findings below may reference images not displayed]

ACR Breast Density Category b: There are scattered areas of
fibroglandular density.
FINDINGS: The right lumpectomy site is stable in appears as expected. No
evidence of malignancy in either breast.

Mammographic images were processed with CAD.
IMPRESSION: No mammographic evidence of malignancy.

RECOMMENDATION:
Annual diagnostic mammography.

I have discussed the findings and recommendations with the patient.
If applicable, a reminder letter will be sent to the patient
regarding the next appointment.

BI-RADS CATEGORY  2: Benign.

## 2022-02-27 IMAGING — DX DG CHEST 1V PORT
1 series · 1 of 1 positions shown · non-contrast
Comparison: March 31, 2018

CLINICAL DATA: Weakness.

EXAM:
PORTABLE CHEST 1 VIEW

[chest ap]
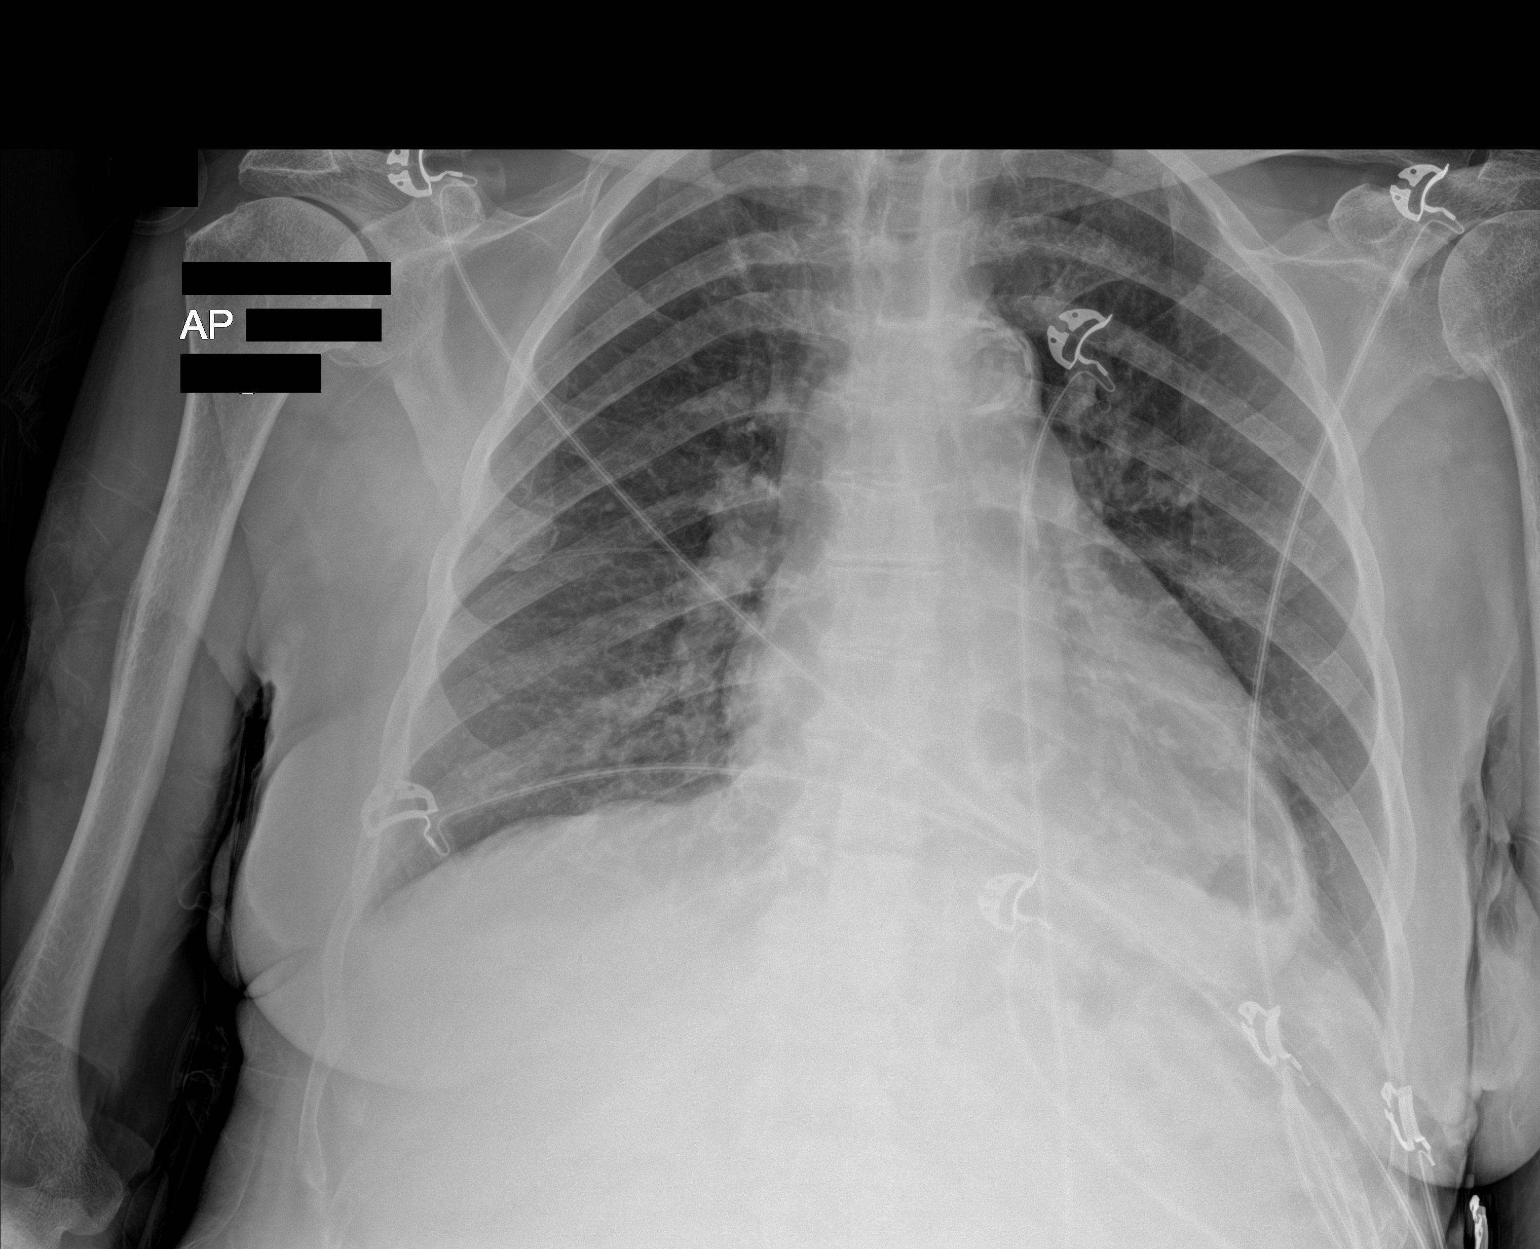

[1 of 1 positions shown; findings below may reference images not displayed]

FINDINGS: The heart size is stable. Aortic calcifications are noted. There is
a large hiatal hernia. There is no pneumothorax. No significant
pleural effusion. There is some atelectasis at the lung bases. There
is no acute osseous abnormality.
IMPRESSION: No acute cardiopulmonary disease. Large hiatal hernia.
